# Patient Record
Sex: Male | Born: 1960
Health system: Southern US, Community
[De-identification: ages and names within clinical notes are randomized; demographics above are authoritative.]

## PROBLEM LIST (undated history)

## (undated) DIAGNOSIS — N186 End stage renal disease: Secondary | ICD-10-CM

## (undated) DIAGNOSIS — I219 Acute myocardial infarction, unspecified: Secondary | ICD-10-CM

## (undated) DIAGNOSIS — I5042 Chronic combined systolic (congestive) and diastolic (congestive) heart failure: Secondary | ICD-10-CM

## (undated) DIAGNOSIS — J351 Hypertrophy of tonsils: Secondary | ICD-10-CM

## (undated) DIAGNOSIS — I10 Essential (primary) hypertension: Secondary | ICD-10-CM

## (undated) DIAGNOSIS — E7800 Pure hypercholesterolemia, unspecified: Secondary | ICD-10-CM

## (undated) DIAGNOSIS — Z9981 Dependence on supplemental oxygen: Secondary | ICD-10-CM

## (undated) DIAGNOSIS — G4733 Obstructive sleep apnea (adult) (pediatric): Secondary | ICD-10-CM

## (undated) DIAGNOSIS — N184 Chronic kidney disease, stage 4 (severe): Secondary | ICD-10-CM

## (undated) DIAGNOSIS — H409 Unspecified glaucoma: Secondary | ICD-10-CM

## (undated) DIAGNOSIS — I509 Heart failure, unspecified: Secondary | ICD-10-CM

## (undated) DIAGNOSIS — J449 Chronic obstructive pulmonary disease, unspecified: Secondary | ICD-10-CM

## (undated) DIAGNOSIS — M1990 Unspecified osteoarthritis, unspecified site: Secondary | ICD-10-CM

## (undated) DIAGNOSIS — Z9989 Dependence on other enabling machines and devices: Secondary | ICD-10-CM

## (undated) DIAGNOSIS — E119 Type 2 diabetes mellitus without complications: Secondary | ICD-10-CM

## (undated) DIAGNOSIS — E669 Obesity, unspecified: Secondary | ICD-10-CM

## (undated) HISTORY — DX: Essential (primary) hypertension: I10

## (undated) HISTORY — PX: HERNIA REPAIR: SHX51

## (undated) HISTORY — DX: Obesity, unspecified: E66.9

## (undated) HISTORY — DX: Hypertrophy of tonsils: J35.1

## (undated) HISTORY — PX: UMBILICAL HERNIA REPAIR: SHX196

---

## 2006-09-05 ENCOUNTER — Emergency Department (HOSPITAL_COMMUNITY): Admission: EM | Admit: 2006-09-05 | Discharge: 2006-09-05 | Payer: Self-pay | Admitting: Emergency Medicine

## 2006-09-12 ENCOUNTER — Inpatient Hospital Stay (HOSPITAL_COMMUNITY): Admission: EM | Admit: 2006-09-12 | Discharge: 2006-09-17 | Payer: Self-pay | Admitting: Emergency Medicine

## 2006-09-15 ENCOUNTER — Encounter (INDEPENDENT_AMBULATORY_CARE_PROVIDER_SITE_OTHER): Payer: Self-pay | Admitting: Cardiology

## 2006-09-22 ENCOUNTER — Ambulatory Visit: Payer: Self-pay | Admitting: Family Medicine

## 2006-09-26 ENCOUNTER — Ambulatory Visit: Payer: Self-pay | Admitting: *Deleted

## 2006-10-14 ENCOUNTER — Encounter: Payer: Self-pay | Admitting: Internal Medicine

## 2006-10-14 ENCOUNTER — Ambulatory Visit (HOSPITAL_BASED_OUTPATIENT_CLINIC_OR_DEPARTMENT_OTHER): Admission: RE | Admit: 2006-10-14 | Discharge: 2006-10-14 | Payer: Self-pay | Admitting: *Deleted

## 2006-10-20 ENCOUNTER — Ambulatory Visit: Payer: Self-pay | Admitting: Internal Medicine

## 2006-11-03 ENCOUNTER — Ambulatory Visit: Payer: Self-pay | Admitting: Family Medicine

## 2006-12-25 ENCOUNTER — Ambulatory Visit: Payer: Self-pay | Admitting: Internal Medicine

## 2007-01-22 ENCOUNTER — Ambulatory Visit: Payer: Self-pay | Admitting: Internal Medicine

## 2007-02-11 ENCOUNTER — Ambulatory Visit: Payer: Self-pay | Admitting: Internal Medicine

## 2007-03-04 ENCOUNTER — Ambulatory Visit: Payer: Self-pay | Admitting: Internal Medicine

## 2007-03-04 LAB — CONVERTED CEMR LAB
ALT: 16 U/L
AST: 18 U/L
Albumin: 3.7 g/dL
Alkaline Phosphatase: 43 U/L
BUN: 16 mg/dL
Basophils Absolute: 0 K/uL
Basophils Relative: 0 %
CO2: 27 meq/L
Calcium: 9.1 mg/dL
Chloride: 100 meq/L
Cholesterol: 183 mg/dL
Creatinine, Ser: 1.28 mg/dL
Eosinophils Absolute: 0.1 K/uL
Eosinophils Relative: 1 %
Glucose, Bld: 89 mg/dL
HCT: 43.7 %
HDL: 42 mg/dL
Hemoglobin: 13.9 g/dL
LDL Cholesterol: 124 mg/dL — ABNORMAL HIGH
Lymphocytes Relative: 31 %
Lymphs Abs: 2.3 K/uL
MCHC: 31.8 g/dL
MCV: 89.4 fL
Monocytes Absolute: 0.7 K/uL
Monocytes Relative: 10 %
Neutro Abs: 4.3 K/uL
Neutrophils Relative %: 59 %
Platelets: 307 K/uL
Potassium: 4.2 meq/L
RBC: 4.89 M/uL
RDW: 16 % — ABNORMAL HIGH
Sodium: 139 meq/L
Total Bilirubin: 0.7 mg/dL
Total CHOL/HDL Ratio: 4.4
Total Protein: 8.3 g/dL
Triglycerides: 85 mg/dL
VLDL: 17 mg/dL
WBC: 7.3 10*3/microliter

## 2007-03-10 DIAGNOSIS — I5032 Chronic diastolic (congestive) heart failure: Secondary | ICD-10-CM

## 2007-03-10 DIAGNOSIS — G4733 Obstructive sleep apnea (adult) (pediatric): Secondary | ICD-10-CM

## 2007-03-10 DIAGNOSIS — J351 Hypertrophy of tonsils: Secondary | ICD-10-CM

## 2007-05-25 ENCOUNTER — Ambulatory Visit: Payer: Self-pay | Admitting: Internal Medicine

## 2007-06-01 ENCOUNTER — Ambulatory Visit: Payer: Self-pay | Admitting: Internal Medicine

## 2007-06-17 ENCOUNTER — Ambulatory Visit: Payer: Self-pay | Admitting: Internal Medicine

## 2007-06-17 LAB — CONVERTED CEMR LAB
BUN: 16 mg/dL (ref 6–23)
CO2: 28 meq/L (ref 19–32)
Calcium: 8.8 mg/dL (ref 8.4–10.5)
Creatinine, Ser: 1.18 mg/dL (ref 0.40–1.50)
Glucose, Bld: 101 mg/dL — ABNORMAL HIGH (ref 70–99)

## 2007-06-22 ENCOUNTER — Encounter (INDEPENDENT_AMBULATORY_CARE_PROVIDER_SITE_OTHER): Payer: Self-pay | Admitting: Family Medicine

## 2007-06-22 ENCOUNTER — Ambulatory Visit: Payer: Self-pay | Admitting: Internal Medicine

## 2007-06-22 LAB — CONVERTED CEMR LAB
AST: 17 units/L (ref 0–37)
Alkaline Phosphatase: 46 units/L (ref 39–117)
BUN: 18 mg/dL (ref 6–23)
Creatinine, Ser: 1.39 mg/dL (ref 0.40–1.50)
HDL: 40 mg/dL (ref >39)
LDL Cholesterol: 57 mg/dL (ref 0–99)
Total CHOL/HDL Ratio: 2.8
Triglycerides: 66 mg/dL (ref <150)

## 2007-07-13 ENCOUNTER — Ambulatory Visit: Payer: Self-pay | Admitting: Internal Medicine

## 2007-08-10 ENCOUNTER — Ambulatory Visit: Payer: Self-pay | Admitting: Internal Medicine

## 2007-11-19 ENCOUNTER — Ambulatory Visit: Payer: Self-pay | Admitting: Internal Medicine

## 2008-04-06 ENCOUNTER — Ambulatory Visit: Payer: Self-pay | Admitting: Internal Medicine

## 2008-05-18 ENCOUNTER — Encounter (INDEPENDENT_AMBULATORY_CARE_PROVIDER_SITE_OTHER): Payer: Self-pay | Admitting: Adult Health

## 2008-05-18 ENCOUNTER — Ambulatory Visit: Payer: Self-pay | Admitting: Internal Medicine

## 2008-05-18 LAB — CONVERTED CEMR LAB
ALT: 19 units/L (ref 0–53)
Albumin: 3.5 g/dL (ref 3.5–5.2)
Alkaline Phosphatase: 38 units/L — ABNORMAL LOW (ref 39–117)
Basophils Absolute: 0 10*3/uL (ref 0.0–0.1)
Eosinophils Absolute: 0.1 10*3/uL (ref 0.0–0.7)
Glucose, Bld: 87 mg/dL (ref 70–99)
LDL Cholesterol: 56 mg/dL (ref 0–99)
Lymphocytes Relative: 48 % — ABNORMAL HIGH (ref 12–46)
Lymphs Abs: 2.3 10*3/uL (ref 0.7–4.0)
MCV: 89.2 fL (ref 78.0–100.0)
Neutrophils Relative %: 40 % — ABNORMAL LOW (ref 43–77)
Platelets: 255 10*3/uL (ref 150–400)
Potassium: 4.8 meq/L (ref 3.5–5.3)
RDW: 16 % — ABNORMAL HIGH (ref 11.5–15.5)
Sodium: 141 meq/L (ref 135–145)
Total Bilirubin: 0.5 mg/dL (ref 0.3–1.2)
Total Protein: 7.6 g/dL (ref 6.0–8.3)
Triglycerides: 33 mg/dL (ref <150)
VLDL: 7 mg/dL (ref 0–40)
WBC: 4.7 10*3/uL (ref 4.0–10.5)

## 2008-06-21 ENCOUNTER — Emergency Department (HOSPITAL_COMMUNITY): Admission: EM | Admit: 2008-06-21 | Discharge: 2008-06-21 | Payer: Self-pay | Admitting: Emergency Medicine

## 2008-06-22 ENCOUNTER — Ambulatory Visit: Payer: Self-pay | Admitting: Internal Medicine

## 2008-09-12 ENCOUNTER — Ambulatory Visit: Payer: Self-pay | Admitting: Internal Medicine

## 2008-11-14 ENCOUNTER — Ambulatory Visit: Payer: Self-pay | Admitting: Internal Medicine

## 2008-11-14 DIAGNOSIS — E785 Hyperlipidemia, unspecified: Secondary | ICD-10-CM | POA: Insufficient documentation

## 2009-01-11 ENCOUNTER — Ambulatory Visit: Payer: Self-pay | Admitting: Family Medicine

## 2009-01-11 ENCOUNTER — Encounter (INDEPENDENT_AMBULATORY_CARE_PROVIDER_SITE_OTHER): Payer: Self-pay | Admitting: Adult Health

## 2009-01-11 LAB — CONVERTED CEMR LAB
ALT: 15 units/L (ref 0–53)
AST: 20 units/L (ref 0–37)
Albumin: 3.7 g/dL (ref 3.5–5.2)
Alkaline Phosphatase: 35 units/L — ABNORMAL LOW (ref 39–117)
LDL Cholesterol: 54 mg/dL (ref 0–99)
PSA: 1.39 ng/mL (ref 0.10–4.00)
Potassium: 4.3 meq/L (ref 3.5–5.3)
Pro B Natriuretic peptide (BNP): 106 pg/mL — ABNORMAL HIGH (ref 0.0–100.0)
Sodium: 142 meq/L (ref 135–145)
Total Bilirubin: 0.6 mg/dL (ref 0.3–1.2)
Total Protein: 7.9 g/dL (ref 6.0–8.3)
VLDL: 8 mg/dL (ref 0–40)

## 2009-02-20 ENCOUNTER — Encounter (INDEPENDENT_AMBULATORY_CARE_PROVIDER_SITE_OTHER): Payer: Self-pay | Admitting: Adult Health

## 2009-02-20 ENCOUNTER — Ambulatory Visit: Payer: Self-pay | Admitting: Internal Medicine

## 2009-02-20 LAB — CONVERTED CEMR LAB
CO2: 22 meq/L (ref 19–32)
Calcium: 8.6 mg/dL (ref 8.4–10.5)
Creatinine, Ser: 1.11 mg/dL (ref 0.40–1.50)
Sodium: 139 meq/L (ref 135–145)

## 2009-02-22 ENCOUNTER — Encounter: Payer: Self-pay | Admitting: Internal Medicine

## 2009-02-22 ENCOUNTER — Ambulatory Visit (HOSPITAL_COMMUNITY): Admission: RE | Admit: 2009-02-22 | Discharge: 2009-02-22 | Payer: Self-pay | Admitting: Internal Medicine

## 2009-02-22 ENCOUNTER — Ambulatory Visit: Payer: Self-pay | Admitting: Cardiology

## 2009-03-23 ENCOUNTER — Ambulatory Visit: Payer: Self-pay | Admitting: Internal Medicine

## 2009-04-17 ENCOUNTER — Ambulatory Visit: Payer: Self-pay | Admitting: Internal Medicine

## 2009-07-03 ENCOUNTER — Ambulatory Visit: Payer: Self-pay | Admitting: Internal Medicine

## 2009-07-03 ENCOUNTER — Encounter (INDEPENDENT_AMBULATORY_CARE_PROVIDER_SITE_OTHER): Payer: Self-pay | Admitting: Adult Health

## 2009-07-03 LAB — CONVERTED CEMR LAB
ALT: 17 units/L (ref 0–53)
AST: 24 units/L (ref 0–37)
CO2: 27 meq/L (ref 19–32)
Calcium: 9.1 mg/dL (ref 8.4–10.5)
Chloride: 102 meq/L (ref 96–112)
Cholesterol: 109 mg/dL (ref 0–200)
Creatinine, Ser: 1.15 mg/dL (ref 0.40–1.50)
Hgb A1c MFr Bld: 5.9 % (ref 4.6–6.1)
Pro B Natriuretic peptide (BNP): 26.6 pg/mL (ref 0.0–100.0)
Sodium: 138 meq/L (ref 135–145)
Total Bilirubin: 0.5 mg/dL (ref 0.3–1.2)
Total CHOL/HDL Ratio: 2.3
Total Protein: 7.6 g/dL (ref 6.0–8.3)
VLDL: 7 mg/dL (ref 0–40)

## 2009-09-20 ENCOUNTER — Emergency Department (HOSPITAL_COMMUNITY): Admission: EM | Admit: 2009-09-20 | Discharge: 2009-09-20 | Payer: Self-pay | Admitting: Emergency Medicine

## 2009-10-02 ENCOUNTER — Ambulatory Visit: Payer: Self-pay | Admitting: Internal Medicine

## 2009-11-14 ENCOUNTER — Ambulatory Visit: Payer: Self-pay | Admitting: Internal Medicine

## 2009-12-18 ENCOUNTER — Ambulatory Visit: Payer: Self-pay | Admitting: Internal Medicine

## 2010-04-30 ENCOUNTER — Encounter (INDEPENDENT_AMBULATORY_CARE_PROVIDER_SITE_OTHER): Payer: Self-pay | Admitting: *Deleted

## 2010-04-30 LAB — CONVERTED CEMR LAB
ALT: 25 units/L (ref 0–53)
AST: 36 units/L (ref 0–37)
Creatinine, Ser: 1.05 mg/dL (ref 0.40–1.50)
HDL: 52 mg/dL (ref >39)
Sodium: 140 meq/L (ref 135–145)
Total Bilirubin: 0.9 mg/dL (ref 0.3–1.2)
Total Protein: 7.4 g/dL (ref 6.0–8.3)

## 2010-06-28 NOTE — Assessment & Plan Note (Signed)
Summary: 12 months/apc   Primary Provider/Referring Provider:  Shanon Brow Talbot/ [facility]  CC:  12 month, pt states he is feeling, and no new complaints; not using CPAP per pt..  History of Present Illness: 11-28-2007- OSA 50 year old man returning for follow-up of sleep apnea.  He says he is doing great and sleeping very well.  CPAP is at 13 cwp and is worn every night.  He never did have surgery on his tonsils.he denies headache, nasal congestion, daytime sleepiness.  11/14/08- OSA One year f/u. He remains fully compliant with cpap at 13 and says "it is great, man". Nasal pillows mask- [facility]. He never did get tonsilectomy. Denies headache, nasal congestion, sinus pressure pain, sore throats, cough or wheeze.    November 14, 2009- OSA A couple months ago he just stopped bothering to use CPAP " no reason". Sleeps alone with no reporter to c/o his snoring. he says no longer falling asleep in daytime. Over past 3 years he has lost weight from 330 in 12/08 to 307 today. Says he is watching diet, joined gym recently.   Preventive Screening-Counseling & Management  Alcohol-Tobacco     Smoking Status: never  Current Medications (verified): 1)  Norvasc 10 Mg Tabs (Amlodipine Besylate) .... Take 1 Tablet By Mouth Once A Day 2)  Lisinopril 40 Mg  Tabs (Lisinopril) .... One By Mouth Once Daily 3)  Bl Aspirin 325 Mg  Tabs (Aspirin) .... Use One Daily 4)  Cpap - 13 Cwp  [facility] 5)  Lipitor 10 Mg  Tabs (Atorvastatin Calcium) .... Take 1 Tablet By Mouth Once A Day 6)  Atenolol 50 Mg Tabs (Atenolol) .... 1/2 Tab Every Day 7)  Furosemide 20 Mg Tabs (Furosemide) .Marland Kitchen.. 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   Risk Factors: Smoking Status: never (11/14/2009)  Social History: works as a Training and development officer single  no children Patient never smoked.   Review of Systems      See HPI       The patient complains of weight loss.  The patient denies anorexia, fever, weight gain, vision loss, decreased hearing, hoarseness, chest pain, syncope, dyspnea on exertion, peripheral edema, prolonged cough, headaches, hemoptysis, abdominal pain, melena, hematochezia, and severe indigestion/heartburn.    Vital Signs:  Patient profile:   50 year old male Height:      72 inches Weight:      307 pounds BMI:     41.79 O2 Sat:      96 % on Room air Pulse rate:   50 / minute BP sitting:   120 / 80  (left arm) Cuff size:   large  Vitals Entered By: Lyndee Leo, CMA (November 14, 2009 9:02 AM)  O2 Sat at Rest %:  96 O2 Flow:  Room air  Reason for Visit 12 monthg f/u no new complaints CC: 12 month, pt states he is feeling, no new complaints; not using CPAP per pt.   Physical Exam  Additional Exam:  General: A/Ox3; pleasant and cooperative, NAD, overweight SKIN: no  rash, lesions NODES: no lymphadenopathy HEENT: [place]/AT, EOM- WNL, Conjuctivae- clear, PERRLA, TM-WNL, Nose- clear, Throat- tonsils large, nonocclusive, Mallampati III, no stridor or hoarseness. NECK: Supple w/ fair ROM, JVD- none, normal carotid impulses w/o bruits Thyroid- normal to palpation CHEST: Clear to P&A HEART: RRR, no m/g/r heard ABDOMEN: Soft and nl; [id number], nl pulses, no edema  NEURO: Grossly intact to observation      Impression & Recommendations:  Problem # 1:  OBSTRUCTIVE SLEEP APNEA (ICD-327.23)  Noncompliant after good initial experience. He might have  lost enough weight to matter.We discussed medical problems, symptoms and conservative treatments for OSA.  We will have his DME work with him on comfort issues. Consider cpap desensitization with sleep techs if needed.  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 No need to consider surgery referral at this time.  Problem # 3:  EXOGENOUS OBESITY (ICD-278.00)  Sustained progress at weight loss would change his whole health paradigm so i encouraged him to continue. He agrees to an Copy for cpap/ pressure need.  Medications Added to Medication List This Visit: 1)  Furosemide 20 Mg Tabs (Furosemide) .Marland Kitchen.. 1 tablet daily  Other Orders: Est. Patient Level III [id number]) DME Referral (DME)   Patient Instructions: 1)  Please schedule a follow-up appointment in 1 month. 2)  We will contact [facility] to arrange a pressure check on your CPAP to see where you need to be now. Tell them if you have other problems, like uncomfortable cpap mask.   Immunization History:  Influenza Immunization History:    Influenza:  fluvax 3+ (06/02/2009)

## 2010-06-28 NOTE — Assessment & Plan Note (Signed)
Summary: rov 1 month ///kp   Primary Provider/Referring Provider:  Shanon Brow Talbot/ [facility]  CC:  1 month follow up.  States he is wearing cpap everynight since last OV for approx 6-7 hours each night.  Denies problems with pressure and mask.  Marland Kitchen  History of Present Illness: History of Present Illness: 12/15/07- OSA 50 year old man returning for follow-up of sleep apnea.  He says he is doing great and sleeping very well.  CPAP is at 13 cwp and is worn every night.  He never did have surgery on his tonsils.he denies headache, nasal congestion, daytime sleepiness.  11/14/08- OSA One year f/u. He remains fully compliant with cpap at 13 and says "it is great, man". Nasal pillows mask- [facility]. He never did get tonsilectomy. Denies headache, nasal congestion, sinus pressure pain, sore throats, cough or wheeze.    November 14, 2009- OSA A couple months ago he just stopped bothering to use CPAP " no reason". Sleeps alone with no reporter to c/o his snoring. he says no longer falling asleep in daytime. Over past 3 years he has lost weight from 330 in 12/08 to 307 today. Says he is watching diet, joined gym recently.  December 18, 2009 - OSA He is back to using CPAP at 13 cwp for at least 6-7 hours everynight and doing better. He feels more energy- better rested. He would prefer not to need it, but it doesn't hurt and there are no specific problems.  Denies routine cough.    Preventive Screening-Counseling & Management  Alcohol-Tobacco     Smoking Status: never  Current Medications (verified): 1)  Norvasc 10 Mg Tabs (Amlodipine Besylate) .... Take 1 Tablet By Mouth Once A Day 2)  Lisinopril 40 Mg  Tabs (Lisinopril) .... One By Mouth Once Daily 3)  Bl Aspirin 325 Mg  Tabs (Aspirin) .... Use One Daily 4)  Cpap - 13 Cwp  [facility] 5)  Lipitor 10 Mg  Tabs (Atorvastatin Calcium) .... Take 1 Tablet By Mouth Once A Day 6)  Atenolol 50 Mg Tabs (Atenolol) .... 1/2 Tab Every Day 7)  Furosemide 20 Mg  Tabs (Furosemide) .Marland Kitchen.. 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   Risk Factors: Smoking Status: never (12/18/2009)  Review of Systems      See HPI  The patient denies anorexia, fever, weight loss, weight gain, vision loss, decreased hearing, hoarseness, chest pain, syncope, dyspnea on exertion, peripheral edema, prolonged cough, headaches, hemoptysis, abdominal pain, and severe indigestion/heartburn.    Vital Signs:  Patient profile:   50 year old male Height:      72 inches Weight:      310.50 pounds BMI:     42.26 O2 Sat:      100 % on Room air Pulse rate:   56 / minute BP sitting:   102 / 68  (right arm) Cuff size:   large  Vitals Entered By: Raymondo Band RN (December 18, 2009 9:00 AM)  O2 Flow:  Room air CC: 1 month follow up.  States  he is wearing cpap everynight since last OV for approx 6-7 hours each night.  Denies problems with pressure and mask.   Comments Medications reviewed with patient Daytime contact number verified with patient. Raymondo Band RN  December 18, 2009 9:01 AM    Physical Exam  Additional Exam:  General: A/Ox3; pleasant and cooperative, NAD, overweight. Exam is little changed. SKIN: no rash, lesions NODES: no lymphadenopathy HEENT: [place]/AT, EOM- WNL, Conjuctivae- clear, PERRLA, TM-WNL, Nose- clear, Throat- tonsils large, nonocclusive, Mallampati III, no stridor or hoarseness. NECK: Supple w/ fair ROM,  JVD- none, normal carotid impulses w/o bruits Thyroid- normal to palpation CHEST: Clear to P&A HEART: RRR, no m/g/r heard ABDOMEN: Soft and nl; [id number], nl pulses, no edema  NEURO: Grossly intact to observation      Impression & Recommendations:  Problem # 1:  OBSTRUCTIVE SLEEP APNEA (ICD-327.23)  Good compliance and control now. He is motivated and i think he can continue long term. We discussed alternatives and medical reasons to use CPAP.  Problem # 2:  EXOGENOUS OBESITY (ICD-278.00)  Weight loss would help his sleep apnea and is encouraged.  Orders: Est. Patient Level III [id number])  Patient Instructions: 1)  Please schedule a follow-up appointment in 1 year. 2)  Continue CPAP all night, every night at 13 cwp.  Call us or the home care company if it gets uncomfortable or you have problems.

## 2010-09-10 LAB — DIFFERENTIAL
Basophils Absolute: 0 10*3/uL (ref 0.0–0.1)
Lymphocytes Relative: 34 % (ref 12–46)
Monocytes Absolute: 0.9 10*3/uL (ref 0.1–1.0)
Monocytes Relative: 12 % (ref 3–12)
Neutro Abs: 3.9 10*3/uL (ref 1.7–7.7)
Neutrophils Relative %: 53 % (ref 43–77)

## 2010-09-10 LAB — COMPREHENSIVE METABOLIC PANEL
Albumin: 3.1 g/dL — ABNORMAL LOW (ref 3.5–5.2)
BUN: 18 mg/dL (ref 6–23)
Creatinine, Ser: 1.32 mg/dL (ref 0.4–1.5)
Glucose, Bld: 84 mg/dL (ref 70–99)
Total Bilirubin: 0.8 mg/dL (ref 0.3–1.2)
Total Protein: 7.5 g/dL (ref 6.0–8.3)

## 2010-09-10 LAB — CBC
HCT: 40.8 % (ref 39.0–52.0)
MCV: 86.9 fL (ref 78.0–100.0)
Platelets: 218 10*3/uL (ref 150–400)
RDW: 15.9 % — ABNORMAL HIGH (ref 11.5–15.5)

## 2010-09-10 LAB — POCT CARDIAC MARKERS
CKMB, poc: 7.6 ng/mL (ref 1.0–8.0)
Myoglobin, poc: 246 ng/mL (ref 12–200)

## 2010-09-13 ENCOUNTER — Emergency Department (HOSPITAL_COMMUNITY): Payer: Self-pay

## 2010-09-13 ENCOUNTER — Emergency Department (HOSPITAL_COMMUNITY)
Admission: EM | Admit: 2010-09-13 | Discharge: 2010-09-13 | Disposition: A | Discharge status: Home / Self Care | Payer: Self-pay | Attending: Emergency Medicine | Admitting: Emergency Medicine

## 2010-09-13 DIAGNOSIS — I10 Essential (primary) hypertension: Secondary | ICD-10-CM | POA: Insufficient documentation

## 2010-09-13 DIAGNOSIS — I509 Heart failure, unspecified: Secondary | ICD-10-CM | POA: Insufficient documentation

## 2010-09-13 DIAGNOSIS — R609 Edema, unspecified: Secondary | ICD-10-CM | POA: Insufficient documentation

## 2010-09-13 DIAGNOSIS — M25539 Pain in unspecified wrist: Secondary | ICD-10-CM | POA: Insufficient documentation

## 2010-09-13 DIAGNOSIS — M25579 Pain in unspecified ankle and joints of unspecified foot: Secondary | ICD-10-CM | POA: Insufficient documentation

## 2010-09-13 DIAGNOSIS — S93409A Sprain of unspecified ligament of unspecified ankle, initial encounter: Secondary | ICD-10-CM | POA: Insufficient documentation

## 2010-09-13 DIAGNOSIS — W108XXA Fall (on) (from) other stairs and steps, initial encounter: Secondary | ICD-10-CM | POA: Insufficient documentation

## 2010-09-13 DIAGNOSIS — M25569 Pain in unspecified knee: Secondary | ICD-10-CM | POA: Insufficient documentation

## 2010-09-13 DIAGNOSIS — S63509A Unspecified sprain of unspecified wrist, initial encounter: Secondary | ICD-10-CM | POA: Insufficient documentation

## 2010-09-13 DIAGNOSIS — E785 Hyperlipidemia, unspecified: Secondary | ICD-10-CM | POA: Insufficient documentation

## 2010-09-13 DIAGNOSIS — IMO0002 Reserved for concepts with insufficient information to code with codable children: Secondary | ICD-10-CM | POA: Insufficient documentation

## 2010-09-18 ENCOUNTER — Emergency Department (HOSPITAL_COMMUNITY)
Admission: EM | Admit: 2010-09-18 | Discharge: 2010-09-18 | Disposition: A | Discharge status: Home / Self Care | Payer: Self-pay | Attending: Emergency Medicine | Admitting: Emergency Medicine

## 2010-09-18 DIAGNOSIS — S8990XA Unspecified injury of unspecified lower leg, initial encounter: Secondary | ICD-10-CM | POA: Insufficient documentation

## 2010-09-18 DIAGNOSIS — M25469 Effusion, unspecified knee: Secondary | ICD-10-CM | POA: Insufficient documentation

## 2010-09-18 DIAGNOSIS — M25569 Pain in unspecified knee: Secondary | ICD-10-CM | POA: Insufficient documentation

## 2010-09-18 DIAGNOSIS — Y92009 Unspecified place in unspecified non-institutional (private) residence as the place of occurrence of the external cause: Secondary | ICD-10-CM | POA: Insufficient documentation

## 2010-09-18 DIAGNOSIS — W19XXXA Unspecified fall, initial encounter: Secondary | ICD-10-CM | POA: Insufficient documentation

## 2010-09-18 DIAGNOSIS — E785 Hyperlipidemia, unspecified: Secondary | ICD-10-CM | POA: Insufficient documentation

## 2010-09-18 DIAGNOSIS — I10 Essential (primary) hypertension: Secondary | ICD-10-CM | POA: Insufficient documentation

## 2010-09-18 DIAGNOSIS — IMO0002 Reserved for concepts with insufficient information to code with codable children: Secondary | ICD-10-CM | POA: Insufficient documentation

## 2010-09-18 DIAGNOSIS — Z79899 Other long term (current) drug therapy: Secondary | ICD-10-CM | POA: Insufficient documentation

## 2010-09-18 DIAGNOSIS — I509 Heart failure, unspecified: Secondary | ICD-10-CM | POA: Insufficient documentation

## 2010-09-18 DIAGNOSIS — S99919A Unspecified injury of unspecified ankle, initial encounter: Secondary | ICD-10-CM | POA: Insufficient documentation

## 2010-10-09 NOTE — Assessment & Plan Note (Signed)
[place]                             PULMONARY OFFICE NOTE   NAME:Adam Dennis, Adam Dennis                      MRN:          [id number]  DATE:12/25/2006                            DOB:          1960-06-22    PROBLEM:  This is a 50 year old man referred through the courtesy of Dr.  Jobe Igo in sleep medicine consultation.   HISTORY:  The patient gives a history of loud snoring. He is not aware  if he stops breathing. He does notice daytime sleepiness, but implies  that it is not in the way of his lifestyle. He reports a very irregular  sleep scheduling, averaging 5 to 6 hours of sleep at night, but says  that he has been doing much better since he was hospitalized for  congestive heart failure in May. The discharge summary from that visit  indicates diastolic heart failure and apparently he lost quite a bit of  weight with diuresis. Bedtime is anywhere from 10pm to 1am with short  sleep latency estimating three awakenings per night, mostly for the  bathroom. He is up in the morning between 6 and 6:15am. A nocturnal  polysomnogram was done 10/14/2006 demonstrating very severe obstructive  apnea with an index of 161 per hour, very loud snoring, and oxygen  desaturation nadir of 58%. CPAP titration was not done.   MEDICATIONS:  1. Norvasc 5 mg.  2. Lisinopril 40 mg.  3. Hydrochlorothiazide 25 mg.  4. Aspirin 325 mg.   ALLERGIES:  No medication allergy.   REVIEW OF SYSTEMS:  He denies nasal congestion and says that his weight  has been stable for a long time, except for the weight loss associated  with diuresis after his hospital visit. He sleeps on one pillow with  nocturia once or twice.   PAST HISTORY:  Hypertension, diastolic heart failure, sleep apnea. He  denies any history of lung disease. He denies any surgery on his nose or  throat.   SOCIAL HISTORY:  Never smoked. He stopped drinking in May 2008. He is  single with no children.   FAMILY  HISTORY:  Nobody with sleep apnea or heart disease.   OBJECTIVE:  VITAL SIGNS:  Weight 335 pounds, blood pressure 110/78,  pulse 84, room air saturation 96%.  GENERAL:  He is quite obese and alert.  HEENT:  Palette spacing 3-4/4. Tonsils are prominent. Voice quality is  normal without strider. There is no palpable thyroid.  NECK:  Veins are not distended.  HEART:  Sounds are regular without murmur or gallop.  CHEST:  Clear.  EXTREMITIES:  There is no peripheral edema now.   IMPRESSION:  1. Very severe obstructive sleep apnea/hypopnea syndrome.  2. Exogenous obesity.  3. Diastolic heart failure with weight loss attributed to diuresis.  4. Tonsil hypertrophy.   PLAN:  1. We are going to titrate him for CPAP.  2. I have discussed sleep apnea physiology and available treatments.  3. He is encouraged to return in one month for follow up, or earlier      p.r.n.     Clinton D. Young,  MD, [place], FACP  Electronically Signed    CDY/MedQ  DD: 12/27/2006  DT: 12/28/2006  Job #: [id number]   cc:   Monia Sabal. Jobe Igo, M.D.

## 2010-10-09 NOTE — Procedures (Signed)
NAME:  Adam Dennis, Adam Dennis               ACCOUNT NO.:  [id number]   MEDICAL RECORD NO.:  [id number]          PATIENT TYPE:  OUT   LOCATION:  SLEEP CENTER                 FACILITY:  [facility]   PHYSICIAN:  Clinton D. Annamaria Boots, MD, FCCP, FACPDATE OF BIRTH:  1961-04-[birth year]   DATE OF STUDY:                            NOCTURNAL POLYSOMNOGRAM   REFERRING PHYSICIAN:  Vladimir Faster, MD   INDICATION FOR STUDY:  Hypersomnia with sleep apnea.   EPWORTH SLEEPINESS SCORE:  10/24.   BODY MASS INDEX:  45.4.   WEIGHT:  355 pounds.   MEDICATIONS:  Home medications are listed and reviewed.  A diagnostic  MPSG protocol was requested.   SLEEP ARCHITECTURE:  Total sleep time 334 minutes with sleep efficiency  82%.  Stage 1 was 15%, stage 2 71%, and stages 3 and 4 absent.  REM 14%  of total sleep time.  Sleep latency 12 minutes.  REM latency 134  minutes.  Awake after sleep onset 61 minutes.  Arousal index 47.8.  No  bedtime medication was taken.   RESPIRATORY DATA:  Diagnostic MPSG protocol as ordered.  Very severe  obstructive sleep apnea/hypopnea syndrome.  Apnea/hypopnea index  (AHI,RDI) 161.5 obstructive events per hour.  Events were not  positional.  REM AHI 147.   OXYGEN DATA:  Very loud snoring with oxygen desaturation to a nadir of  58%.  Mean oxygen saturation through the study was 85% on room air.   CARDIAC DATA:  Normal sinus rhythm with sinus arrhythmia.   MOVEMENT-PARASOMNIA:  Rare limb jerk, insignificant.   IMPRESSIONS-RECOMMENDATIONS:  1. Very severe obstructive sleep apnea/hypopnea syndrome, AHI 161.5      per hour non- positional events.  Very loud snoring with oxygen      desaturation.  2. Oxygen desaturation was to a nadir of 58%.  A mean oxygen      saturation through the study was 85% on room air.  Ordinarily, this      would be an indication of underlying cardiopulmonary disease, but      the very frequent apneic events suggest that the apneas may      normalize oxygen saturation for  this patient.  3. A diagnostic MPSG protocol was ordered and followed.  A split study      protocol would have authorized the      technician to proceed directly to CPAP.  Consider early return for      CPAP titration or evaluate for alternative therapies is      appropriate.      Clinton D. Annamaria Boots, MD, [place], FACP  Diplomate, Tax adviser of Sleep Medicine  Electronically Signed     CDY/MEDQ  D:  10/20/2006 09:11:22  T:  10/20/2006 09:45:53  Job:  [id number]

## 2010-10-09 NOTE — Assessment & Plan Note (Signed)
[place]                             PULMONARY OFFICE NOTE   NAME:Zettlemoyer, KALEAL BAMBA                      MRN:          [id number]  DATE:01/22/2007                            DOB:          [birth year]    PROBLEM:  1. Obstructive sleep apnea.  2. Exogenous obesity.  3. Diastolic heart failure.  4. Tonsil hypertrophy.   HISTORY:  He feels comfortable with CPAP now at 13 CWP using a Nasal  Pillows mask and is able to use it all night every night with less  daytime sleepiness.   MEDICATIONS:  1. Norvasc 5 mg.  2. Lisinopril 40 mg.  3. HCTZ 25 mg.  4. Aspirin 325 mg.  5. CPAP at 13 CWP.   ALLERGIES:  NONE.   OBJECTIVE:  VITAL SIGNS:  Weight 339 pounds, still massively overweight;  blood pressure 130/80, pulse 82, room air saturation 97%.  GENERAL:  He seems alert.  There are no pressure marks on his face.  HEENT:  Nasal airway is clear.   IMPRESSION:  1. Obstructive sleep apnea under better control now with continuous      positive airway pressure.  2. Obesity.   PLAN:  I reemphasized weight loss, and we again discussed sleep hygiene  issues and use of CPAP.  Schedule return in 4 months or earlier p.r.n.     Clinton D. Annamaria Boots, MD, [place] Flood, [place]  Electronically Signed    CDY/MedQ  DD: 01/26/2007  DT: 01/26/2007  Job #: [id number]   cc:   Monia Sabal. Jobe Igo, M.D.

## 2010-10-12 NOTE — Discharge Summary (Signed)
NAME:  Adam Dennis, Adam Dennis               ACCOUNT NO.:  [id number]   MEDICAL RECORD NO.:  [id number]          PATIENT TYPE:  INP   LOCATION:  [id number]                         FACILITY:  [place]   PHYSICIAN:  Vladimir Faster, MD        DATE OF BIRTH:  08/03/1960   DATE OF ADMISSION:  09/12/2006  DATE OF DISCHARGE:  09/17/2006                               DISCHARGE SUMMARY   PRIMARY CARE PHYSICIAN:  [facility]; this patient is unassigned to [place].   CONSULTATIONS IN THE HOSPITAL:  Cardiology; was seen by Dr. Einar Gip.   DISCHARGE DIAGNOSES:  1. congestive cardiac failure, likely diastolic heart failure.  2. Anasarca.  3. Questionable sleep apnea.  4. Hypertension.  5. Morbid obesity.   DISCHARGE MEDICATIONS:  1. Aspirin 325 mg daily.  2. Lisinopril 40 mg daily.  3. Hydrochlorothiazide 25 mg daily.  4. Norvasc 10 mg daily.   HISTORY OF PRESENT ILLNESS:  For full history and physical, see the  history and physical dictated by Dr. Jari Favre on September 12, 2006.  In  short, Adam Dennis is a 50 year old morbidly obese gentleman who came to  the ER with complaints of swelling and shortness of breath, which was  progressing over the last few weeks.  He had swelling all over his  extremities and his penis and his scrotum.  He had an elevated BNPT of  441, and he was hypoxic with an oxygen saturation of 87% room air.  He  was admitted for further evaluation of possible congestive cardiac  failure.   PROBLEM LIST:  1. Congestive cardiac failure.  He was admitted, and he was started on      diuretics with good response.  We did a CT angiogram in the ER      which did not show any PE.  He had a 2-D echocardiogram done on      September 15, 2006 which was technically a limited study with poor      acoustic windows.  It was unable to assess the left ventricle      ejection fraction on him.  I discussed this with Dr. Einar Gip, the      cardiologist who was seeing him, who did not recommend any further      studies at this  time.  His breathing is now back to his baseline.      He is off oxygen.  His swelling has decreased to his baseline.  As      per Dr. Einar Gip, he is stable to be discharged home, and he would      continue him on just hydrochlorothiazide at home.  He does not want      him on Lasix at this time.  He is asked to follow-up with his      doctor at [facility], and if he sees still continues to have fluid      overload issues, Dr. Einar Gip will be glad to see him in his office.  2. Hypertension.  His blood pressure has been high since he was      admitted.  At  this time, his blood pressure is running 145/92.  We      will be increasing his lisinopril from 20 to 40 at the time of      discharge.  He will need to be followed up at [facility], and we      would want tighter control of his blood pressure.  I suspect that      his heart failure is secondary to his high blood pressure, too.  3. Morbid obesity.  He would need to lose some weight, and he is going      to increase his activity and exercise.  He also has questionable      sleep apnea, and he has been set up for an outpatient sleep study.   DISPOSITION:  He is now being discharged home in stable condition.  He  will be given prescriptions for all his medications.  We are trying to  set up an outpatient sleep study.  The sleep center should call him to  set up the appointment.   FOLLOW UP:  He is asked to follow-up with [facility].  He is already  established at [facility].  He is asked to follow-up with [facility]  in a week's time.      Vladimir Faster, MD  Electronically Signed     PKN/MEDQ  D:  09/17/2006  T:  09/17/2006  Job:  [id number]

## 2010-10-12 NOTE — H&P (Signed)
NAME:  Adam Dennis, Adam Dennis               ACCOUNT NO.:  [id number]   MEDICAL RECORD NO.:  [id number]          PATIENT TYPE:  INP   LOCATION:  [id number]                         FACILITY:  [facility]   PHYSICIAN:  Cyril Mourning, D.O.    DATE OF BIRTH:  September 03, 1960   DATE OF ADMISSION:  09/12/2006  DATE OF DISCHARGE:                              HISTORY & PHYSICAL   PRIMARY CARE PHYSICIAN:  Unassigned.   CHIEF COMPLAINT:  Swelling and shortness of breath.   HISTORY OF PRESENT ILLNESS:  Adam Dennis is a very pleasant, 50 year old,  morbidly obese male who had been in his usual state of health up until  over a week ago, when he first noticed swelling in his penis about 1  week ago presented to the emergency department at [facility]  where he was provided with Lasix.  He took these medications and stated  that this did not seem to help.  He presents again to the emergency  department with continued swelling of his genitals, abdomen, as well as  swelling of his lower extremities.  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  He works at the __________  [employer], and developed some shortness  of breath.  He presented to the emergency department and he was  discovered to have an elevated BNP of 441, as well as an oxygen  saturation of 87% on room air.  His pO2 was also discovered to be 42.  He underwent evaluation for pulmonary embolus.  Due to his large body  habitus, the study was limited though it ruled out central pulmonary  emboli.  He denies any chest pain.   PAST MEDICAL HISTORY:  He denies.   OTHER SURGICAL HISTORY:  He denies.   ROUTINE MEDICATIONS:  He denies.  A little over a week ago, he stated  that he was given Lasix and describes what sounds like Lisinopril.   ALLERGIES:  NO KNOWN DRUG ALLERGIES.   SOCIAL HISTORY:  He is employed as a Training and development officer, quite sedentary lifestyle.  He denies tobacco.  He quit drinking a couple years ago.  He lives with  his brother, Adam Dennis, phone number [phone].   FAMILY HISTORY:  His mother is deceased at age 90, with diabetes and  heart problems.  He does not know his parents.  He has two brothers and  three sisters, one of his brothers has hypertension and diabetes.   REVIEW OF SYSTEMS:  He denies any nausea, vomiting, diarrhea, chest  pain.  He does report shortness of breath with very minimal exertion.  No hematemesis, hematochezia.  He does have swelling of his lower  extremities.  Under further review this is unremarkable.   PHYSICAL EXAMINATION:  VITAL SIGNS:  In the emergency department, he had  a blood pressure of 156/87, temperature 98, heart rate 102, respirations  24, O2 saturations 87% initially.  HEENT:  Head is normocephalic, atraumatic.  Extraocular muscles are  intact.  He does not appear to be in acute distress, though he is mildly  tachypneic.  He is able to speak in full sentences, however.  NECK:  Supple and nontender.  CARDIOVASCULAR:  Reveals a normal S1-S2.  LUNGS:  He exhibits normal effort.  The breath sounds are clear and  present bilaterally with a normal effort.  ABDOMEN:  Very obese.  He exhibits anasarca.  No tenderness.  GENITOURINARY:  His genitalia do reveal edema about his penis and  scrotum.  He does have a large overhanging pannus.  LOWER EXTREMITIES:  Reveal edema bilaterally.  No calf tenderness.   LABORATORY DATA:  In the emergency department, he underwent CT scanning  as described above that ruled out central pulmonary emboli.  Point-of-  care markers revealed a myoglobin of 352, CK-MB of 7, troponin-I 0.05.  Blood gas revealed a pH of 7.315, and a pCO2 69, pO2 of 42.  Sodium 141,  potassium 4.1, BUN 10, creatinine 1.2, glucose 101.  BNP of 441.  WBC of  6.1, hemoglobin 13.6, platelet count 296, MCV 79.  Chest x-ray as noted  above, revealed cardiomegaly with mild pulmonary vascular congestion.  EKG revealed normal sinus rhythm with nonspecific T-wave abnormalities.   ASSESSMENT:  1. Anasarca  2.  New-onset congestive heart failure.  3. Morbid obesity.  4. Mild elevation of myoglobin.   PLAN:  1. At this time, we are going to admit [name] for evaluation of new      onset congestive heart failure.  2. He will need a 2-D echocardiogram.  3. We will cycle his cardiac markers.  4. We will treat his congestive heart failure.  5. At some point he will need evaluation for coronary disease.  6. We will administer low-dose ACE inhibitor and follow his BUN      closely and administer DVT prophylaxis as indicated.  7. He will need a primary care physician      Cyril Mourning, D.O.  Electronically Signed     ESS/MEDQ  D:  09/12/2006  T:  09/13/2006  Job:  [id number]

## 2010-12-17 ENCOUNTER — Ambulatory Visit (INDEPENDENT_AMBULATORY_CARE_PROVIDER_SITE_OTHER): Payer: Self-pay | Admitting: Internal Medicine

## 2010-12-17 ENCOUNTER — Encounter: Payer: Self-pay | Admitting: Internal Medicine

## 2010-12-17 VITALS — BP 104/66 | HR 62 | Ht 72.0 in | Wt 340.8 lb

## 2010-12-17 DIAGNOSIS — G4733 Obstructive sleep apnea (adult) (pediatric): Secondary | ICD-10-CM

## 2010-12-17 DIAGNOSIS — E669 Obesity, unspecified: Secondary | ICD-10-CM

## 2010-12-17 NOTE — Assessment & Plan Note (Signed)
Good compliance and control. Serious weight loss and tonsillectomy were discussed as changes that might permit him to get off CPAP. Otherwise, with his heart and BP problems, CPAP is clearly protective. No setting or equipment changes seem needed at this time.

## 2010-12-17 NOTE — Progress Notes (Signed)
Subjective:    Patient ID: Adam Dennis, male    DOB: 11-15-1960, 50 y.o.   MRN: [id number]  HPI 12/17/10 - 50 yo never smoker, followed for  OSA complicated by obesity, HBP, chronic diastolic heart failure Last here 12/19/10- Still using CPAP all night every night at 13- [facility]. Denies daytime somnolence or complaints about snoring. Has not been able to lose weight. PCP is [facility]   Review of Systems Constitutional:   No-   weight loss, night sweats, fevers, chills, fatigue, lassitude. HEENT:   No-   headaches, difficulty swallowing, tooth/dental problems, sore throat,                  No-   sneezing, itching, ear ache, nasal congestion, post nasal drip,   CV:  No-   chest pain, orthopnea, PND, swelling in lower extremities, anasarca, dizziness, palpitations  GI:  No-   heartburn, indigestion, abdominal pain, nausea, vomiting, diarrhea,                 change in bowel habits, loss of appetite  Resp: No- Acute  shortness of breath with exertion or at rest.  No-  excess mucus,             No-   productive cough,  No non-productive cough,  No-  coughing up of blood.              No-   change in color of mucus.  No- wheezing.    Skin: No-   rash or lesions.  GU: No-   dysuria, change in color of urine, no urgency or frequency.  No- flank pain.  MS:  No-   joint pain or swelling.  No- decreased range of motion.  No- back pain.  Psych:  No- change in mood or affect. No depression or anxiety.  No memory loss.      Objective:   Physical Exam General- Alert, Oriented, Affect-appropriate, Distress- none acute   obese Skin- rash-none, lesions- none, excoriation- none Lymphadenopathy- none Head- atraumatic            Eyes- Gross vision intact, PERRLA, conjunctivae clear secretions            Ears- Hearing, canals normal            Nose- Clear, No-Septal dev, mucus, polyps, erosion, perforation             Throat- Mallampati IV , mucosa clear , drainage- none,                   tonsils- large/ touching Neck- flexible , trachea midline, no stridor , thyroid nl, carotid no bruit Chest - symmetrical excursion , unlabored           Heart/CV- RRR , no murmur , no gallop  , no rub, nl s1 s2  Faintly heard                           - JVD- none , edema- none, stasis changes- none, varices- none           Lung- clear to P&A, wheeze- none, cough- none , dullness-none, rub- none no rales           Chest wall-  Abd- tender-no, distended-no, bowel sounds-present, HSM- no Br/ Gen/ Rectal- Not done, not indicated Extrem- cyanosis- none, clubbing, none, atrophy- none, strength- nl Neuro- grossly intact to observation  Assessment & Plan:

## 2010-12-17 NOTE — Patient Instructions (Signed)
You are doing very well with CPAP. It would work better, and you might be able to get off CPAP someday if you could lose enough weight. That should be a goal for you to work on.

## 2010-12-18 NOTE — Assessment & Plan Note (Signed)
Support for weight loss effort was discussed.

## 2011-12-03 ENCOUNTER — Emergency Department (HOSPITAL_COMMUNITY)
Admission: EM | Admit: 2011-12-03 | Discharge: 2011-12-03 | Disposition: A | Discharge status: Home / Self Care | Payer: Self-pay | Attending: Emergency Medicine | Admitting: Emergency Medicine

## 2011-12-03 DIAGNOSIS — I509 Heart failure, unspecified: Secondary | ICD-10-CM | POA: Insufficient documentation

## 2011-12-03 DIAGNOSIS — G4733 Obstructive sleep apnea (adult) (pediatric): Secondary | ICD-10-CM | POA: Insufficient documentation

## 2011-12-03 DIAGNOSIS — Z833 Family history of diabetes mellitus: Secondary | ICD-10-CM | POA: Insufficient documentation

## 2011-12-03 DIAGNOSIS — E669 Obesity, unspecified: Secondary | ICD-10-CM | POA: Insufficient documentation

## 2011-12-03 DIAGNOSIS — T169XXA Foreign body in ear, unspecified ear, initial encounter: Secondary | ICD-10-CM | POA: Insufficient documentation

## 2011-12-03 DIAGNOSIS — I5032 Chronic diastolic (congestive) heart failure: Secondary | ICD-10-CM | POA: Insufficient documentation

## 2011-12-03 DIAGNOSIS — IMO0002 Reserved for concepts with insufficient information to code with codable children: Secondary | ICD-10-CM | POA: Insufficient documentation

## 2011-12-03 NOTE — ED Notes (Signed)
The cotton of a q-tip got stuck in rt. Ear.

## 2011-12-03 NOTE — ED Provider Notes (Signed)
History     CSN: [id number]  Arrival date & time 12/03/11  1718   First MD Initiated Contact with Patient 12/03/11 1758      Chief Complaint  Patient presents with  . Foreign Body in [place]    (Consider location/radiation/quality/duration/timing/severity/associated sxs/prior treatment) HPI Comments:  and patient presents emergency department with chief complaint of Q-tip, and getting stuck in right ear.  Patient was seen and evaluated in triage as well as having the foreign body removed while in triage.  Patient has no complaints this time including denying fevers, night sweats, chills, draining from your, tinnitus, decreased hearing, or otalgia.  No other complaints at this time.    Patient is a 51 y.o. male presenting with foreign body in ear.  Foreign Body in [place]    Past Medical History  Diagnosis Date  . Hypertrophy of tonsils alone   . Chronic diastolic heart failure   . Obesity, unspecified   . Obstructive sleep apnea (adult) (pediatric)     Past Surgical History  Procedure Date  . Umbilical hernia repair     Family History  Problem Relation Age of Onset  . Diabetes Brother     History  Substance Use Topics  . Smoking status: Never Smoker   . Smokeless tobacco: Never Used  . Alcohol Use: No      Review of Systems  All other systems reviewed and are negative.    Allergies  Review of patient's allergies indicates no known allergies.  Home Medications   Current Outpatient Rx  Name Route Sig Dispense Refill  . AMLODIPINE BESYLATE 10 MG PO TABS Oral Take 10 mg by mouth daily.      . ASPIRIN 325 MG PO TABS Oral Take 325 mg by mouth daily.      . ATENOLOL 50 MG PO TABS Oral Take 25 mg by mouth daily.      . ATORVASTATIN CALCIUM 10 MG PO TABS Oral Take 10 mg by mouth daily.      Marland Kitchen LISINOPRIL 40 MG PO TABS Oral Take 40 mg by mouth daily.        BP 169/102  Pulse 104  Temp 98.4 F (36.9 C) (Oral)  Resp 22  SpO2 100%  Physical Exam  Nursing note and  vitals reviewed. Constitutional: He is oriented to person, place, and time. He appears well-developed and well-nourished. No distress.  HENT:  Head: Normocephalic and atraumatic.       External ear canals normal as well as tympanic membranes bilaterally.  No evidence of foreign body.  Finger rub audible bilaterally.  Eyes: Conjunctivae and EOM are normal.  Neck: Normal range of motion.  Pulmonary/Chest: Effort normal.  Musculoskeletal: Normal range of motion.  Neurological: He is alert and oriented to person, place, and time.  Skin: Skin is warm and dry. No rash noted. He is not diaphoretic.  Psychiatric: He has a normal mood and affect. His behavior is normal.    ED Course  Procedures (including critical care time)  Labs Reviewed - No data to display No results found.   No diagnosis found.    MDM  Foreign body removal  At this time there does not appear to be any evidence of an acute emergency medical condition and the patient appears stable for discharge with appropriate outpatient follow up.his        Verl Dicker, PA-C 12/03/11 1807

## 2011-12-04 NOTE — ED Provider Notes (Signed)
Medical screening examination/treatment/procedure(s) were performed by non-physician practitioner and as supervising physician I was immediately available for consultation/collaboration.    Kathalene Frames, MD 12/04/11 [phone]

## 2011-12-16 ENCOUNTER — Ambulatory Visit: Payer: Self-pay | Admitting: Internal Medicine

## 2012-10-17 ENCOUNTER — Emergency Department (HOSPITAL_COMMUNITY): Payer: BC Managed Care – PPO

## 2012-10-17 ENCOUNTER — Emergency Department (HOSPITAL_COMMUNITY)
Admission: EM | Admit: 2012-10-17 | Discharge: 2012-10-17 | Disposition: A | Discharge status: Home / Self Care | Payer: BC Managed Care – PPO | Attending: Emergency Medicine | Admitting: Emergency Medicine

## 2012-10-17 ENCOUNTER — Encounter (HOSPITAL_COMMUNITY): Payer: Self-pay | Admitting: Emergency Medicine

## 2012-10-17 DIAGNOSIS — R05 Cough: Secondary | ICD-10-CM | POA: Insufficient documentation

## 2012-10-17 DIAGNOSIS — R079 Chest pain, unspecified: Secondary | ICD-10-CM | POA: Insufficient documentation

## 2012-10-17 DIAGNOSIS — E669 Obesity, unspecified: Secondary | ICD-10-CM | POA: Insufficient documentation

## 2012-10-17 DIAGNOSIS — I509 Heart failure, unspecified: Secondary | ICD-10-CM

## 2012-10-17 DIAGNOSIS — Z8669 Personal history of other diseases of the nervous system and sense organs: Secondary | ICD-10-CM | POA: Insufficient documentation

## 2012-10-17 DIAGNOSIS — R059 Cough, unspecified: Secondary | ICD-10-CM | POA: Insufficient documentation

## 2012-10-17 DIAGNOSIS — I5032 Chronic diastolic (congestive) heart failure: Secondary | ICD-10-CM | POA: Insufficient documentation

## 2012-10-17 DIAGNOSIS — R51 Headache: Secondary | ICD-10-CM | POA: Insufficient documentation

## 2012-10-17 DIAGNOSIS — M7989 Other specified soft tissue disorders: Secondary | ICD-10-CM | POA: Insufficient documentation

## 2012-10-17 DIAGNOSIS — Z8709 Personal history of other diseases of the respiratory system: Secondary | ICD-10-CM | POA: Insufficient documentation

## 2012-10-17 LAB — URINALYSIS, ROUTINE W REFLEX MICROSCOPIC
Glucose, UA: NEGATIVE mg/dL
Leukocytes, UA: NEGATIVE
Protein, ur: 100 mg/dL — AB
Specific Gravity, Urine: 1.009 (ref 1.005–1.030)
pH: 7 (ref 5.0–8.0)

## 2012-10-17 LAB — CBC WITH DIFFERENTIAL/PLATELET
Basophils Absolute: 0 10*3/uL (ref 0.0–0.1)
Basophils Relative: 1 % (ref 0–1)
Eosinophils Absolute: 0.1 10*3/uL (ref 0.0–0.7)
Eosinophils Relative: 1 % (ref 0–5)
Lymphs Abs: 1.5 10*3/uL (ref 0.7–4.0)
MCH: 26.3 pg (ref 26.0–34.0)
MCV: 82.9 fL (ref 78.0–100.0)
Neutrophils Relative %: 65 % (ref 43–77)
Platelets: 247 10*3/uL (ref 150–400)
RBC: 5.55 MIL/uL (ref 4.22–5.81)
RDW: 15.8 % — ABNORMAL HIGH (ref 11.5–15.5)

## 2012-10-17 LAB — COMPREHENSIVE METABOLIC PANEL
ALT: 40 U/L (ref 0–53)
AST: 40 U/L — ABNORMAL HIGH (ref 0–37)
Albumin: 2.8 g/dL — ABNORMAL LOW (ref 3.5–5.2)
Alkaline Phosphatase: 48 U/L (ref 39–117)
Calcium: 8.7 mg/dL (ref 8.4–10.5)
GFR calc Af Amer: 75 mL/min — ABNORMAL LOW (ref >90)
Glucose, Bld: 88 mg/dL (ref 70–99)
Potassium: 4 mEq/L (ref 3.5–5.1)
Sodium: 139 mEq/L (ref 135–145)
Total Protein: 7.8 g/dL (ref 6.0–8.3)

## 2012-10-17 LAB — URINE MICROSCOPIC-ADD ON

## 2012-10-17 MED ORDER — FUROSEMIDE 10 MG/ML IJ SOLN
60.0000 mg | Freq: Once | INTRAMUSCULAR | Status: AC
  Administered 2012-10-17: 60 mg via INTRAVENOUS
  Filled 2012-10-17: qty 6

## 2012-10-17 MED ORDER — ATORVASTATIN CALCIUM 10 MG PO TABS: 10.0000 mg | ORAL_TABLET | Freq: Every day | ORAL | Status: DC

## 2012-10-17 MED ORDER — ASPIRIN 325 MG PO TABS: 325.0000 mg | ORAL_TABLET | Freq: Every day | ORAL | Status: DC

## 2012-10-17 MED ORDER — AMLODIPINE BESYLATE 10 MG PO TABS: 10.0000 mg | ORAL_TABLET | Freq: Every day | ORAL | Status: DC

## 2012-10-17 MED ORDER — ATENOLOL 50 MG PO TABS: 25.0000 mg | ORAL_TABLET | Freq: Every day | ORAL | Status: DC

## 2012-10-17 MED ORDER — FUROSEMIDE 20 MG PO TABS: 20.0000 mg | ORAL_TABLET | Freq: Every day | ORAL | Status: DC

## 2012-10-17 MED ORDER — LISINOPRIL 40 MG PO TABS: 40.0000 mg | ORAL_TABLET | Freq: Every day | ORAL | Status: DC

## 2012-10-17 NOTE — ED Provider Notes (Addendum)
History     CSN: [id number]  Arrival date & time 10/17/12  [id number]   First MD Initiated Contact with Patient 10/17/12 [phone]      Chief Complaint  Patient presents with  . Shortness of Breath    (Consider location/radiation/quality/duration/timing/severity/associated sxs/prior treatment) HPI Pt with history of CHF followed by [facility]ology presents for unspecified period with lower ext swelling, DOE, and episodic CP. No pain currently. Pt states he has been out of his meds for "some time". No fever chills. +non-productive cough. Pt states he has not been using his CPAP and is sleeping little. C/o gradual onset diffuse HA without focal weakness or numbness.  Past Medical History  Diagnosis Date  . Hypertrophy of tonsils alone   . Chronic diastolic heart failure   . Obesity, unspecified   . Obstructive sleep apnea (adult) (pediatric)     Past Surgical History  Procedure Laterality Date  . Umbilical hernia repair      Family History  Problem Relation Age of Onset  . Diabetes Brother     History  Substance Use Topics  . Smoking status: Never Smoker   . Smokeless tobacco: Never Used  . Alcohol Use: No      Review of Systems  Constitutional: Negative for fever and chills.  HENT: Negative for sore throat, neck pain and neck stiffness.   Respiratory: Positive for cough and shortness of breath. Negative for wheezing.   Cardiovascular: Positive for chest pain and leg swelling. Negative for palpitations.  Gastrointestinal: Negative for nausea, vomiting and abdominal pain.  Genitourinary: Negative for dysuria and flank pain.  Musculoskeletal: Negative for back pain.  Skin: Negative for pallor, rash and wound.  Neurological: Negative for dizziness, weakness, light-headedness and numbness.  All other systems reviewed and are negative.    Allergies  Review of patient's allergies indicates no known allergies.  Home Medications   Current Outpatient Rx  Name  Route  Sig   Dispense  Refill  . amLODipine (NORVASC) 10 MG tablet   Oral   Take 1 tablet (10 mg total) by mouth daily.   30 tablet   0   . aspirin 325 MG tablet   Oral   Take 1 tablet (325 mg total) by mouth daily.   30 tablet   0   . atenolol (TENORMIN) 50 MG tablet   Oral   Take 0.5 tablets (25 mg total) by mouth daily.   30 tablet   0   . atorvastatin (LIPITOR) 10 MG tablet   Oral   Take 1 tablet (10 mg total) by mouth daily.   30 tablet   0   . furosemide (LASIX) 20 MG tablet   Oral   Take 1 tablet (20 mg total) by mouth daily.   30 tablet   0   . lisinopril (PRINIVIL,ZESTRIL) 40 MG tablet   Oral   Take 1 tablet (40 mg total) by mouth daily.   30 tablet   0     BP 135/92  Pulse 68  Temp(Src) 98.3 F (36.8 C) (Oral)  Resp 23  Ht 6\' 2"  (1.88 m)  Wt 394 lb (178.717 kg)  BMI 50.57 kg/m2  SpO2 94%  Physical Exam  Nursing note and vitals reviewed. Constitutional: He is oriented to person, place, and time. He appears well-developed and well-nourished. No distress.  obese  HENT:  Head: Normocephalic and atraumatic.  Mouth/Throat: Oropharynx is clear and moist. No oropharyngeal exudate.  Eyes: EOM are normal. Pupils are equal,  round, and reactive to light.  Neck: Normal range of motion. Neck supple.  Cardiovascular: Normal rate and regular rhythm.   Pulmonary/Chest: Effort normal and breath sounds normal. No respiratory distress. He has no wheezes. He has no rales.  Abdominal: Soft. Bowel sounds are normal.  Musculoskeletal: Normal range of motion. He exhibits no tenderness. Edema: +3 bl pitting   No calf tenderness  Neurological: He is alert and oriented to person, place, and time.  5/5 motor in all ext, sensation intact  Skin: Skin is warm and dry. No rash noted. No erythema.  Psychiatric: He has a normal mood and affect. His behavior is normal.    ED Course  Procedures (including critical care time)  Labs Reviewed  CBC WITH DIFFERENTIAL - Abnormal; Notable  for the following:    RDW 15.8 (*)    All other components within normal limits  COMPREHENSIVE METABOLIC PANEL - Abnormal; Notable for the following:    Albumin 2.8 (*)    AST 40 (*)    GFR calc non Af Amer 65 (*)    GFR calc Af Amer 75 (*)    All other components within normal limits  PRO B NATRIURETIC PEPTIDE - Abnormal; Notable for the following:    Pro B Natriuretic peptide (BNP) 863.9 (*)    All other components within normal limits  URINALYSIS, ROUTINE W REFLEX MICROSCOPIC - Abnormal; Notable for the following:    Hgb urine dipstick SMALL (*)    Protein, ur 100 (*)    All other components within normal limits  TROPONIN I  URINE MICROSCOPIC-ADD ON   Dg Chest Port 1 View  10/17/2012   *RADIOLOGY REPORT*  Clinical Data: Chest pain.  Shortness of breath.  Lower extremity edema.  PORTABLE CHEST - 1 VIEW 10/17/2012 0913 hours:  Comparison: Two-view chest x-ray 06/21/2008, 09/12/2006.  Findings: Cardiac silhouette moderately enlarged but stable, allowing for differences in technique.  Pulmonary venous hypertension and mild diffuse interstitial pulmonary edema. Atelectasis at the left lung base with chronic elevation of the left hemidiaphragm.  IMPRESSION: Stable cardiomegaly.  Pulmonary venous hypertension and minimal interstitial pulmonary edema consistent with minimal CHF. Atelectasis at the left lung base with stable chronic elevation of the left hemidiaphragm.   Original Report Authenticated By: Evangeline Dakin, M.D.     1. CHF exacerbation       MDM   Pt remains pain free in ED. SOB improved. Has diuresised > 1000cc. Ambulating without desaturation. Wil refill meds and have encouraged to f/u with his cardiologist. Return precautions given       Julianne Rice, MD 10/17/12 1426   Date: 10/17/2012  Rate: 77  Rhythm: normal sinus rhythm  QRS Axis: normal  Intervals: normal  ST/T Wave abnormalities: nonspecific T wave changes  Conduction Disutrbances:none  Narrative  Interpretation:   Old EKG Reviewed: none available    Julianne Rice, MD 10/17/12 1429

## 2012-10-17 NOTE — ED Notes (Signed)
Dr Yelverton at bedside.  

## 2012-10-17 NOTE — ED Notes (Signed)
Pt presents to ED today with c/o shortness of breath for 2 days, headache for 2 days, and swelling in legs and scrotum for 2 days. NAD

## 2012-10-17 NOTE — ED Notes (Signed)
PT ambulated with pulse ox . Pt 94% on room air.

## 2012-12-11 ENCOUNTER — Inpatient Hospital Stay (HOSPITAL_COMMUNITY)
Admission: EM | Admit: 2012-12-11 | Discharge: 2012-12-17 | DRG: 127 | Disposition: A | Discharge status: Home / Self Care | Payer: BC Managed Care – PPO | Attending: Cardiovascular Disease | Admitting: Cardiovascular Disease

## 2012-12-11 ENCOUNTER — Emergency Department (HOSPITAL_COMMUNITY): Payer: BC Managed Care – PPO

## 2012-12-11 ENCOUNTER — Encounter (HOSPITAL_COMMUNITY): Payer: Self-pay | Admitting: *Deleted

## 2012-12-11 DIAGNOSIS — I509 Heart failure, unspecified: Secondary | ICD-10-CM | POA: Diagnosis present

## 2012-12-11 DIAGNOSIS — I5033 Acute on chronic diastolic (congestive) heart failure: Principal | ICD-10-CM | POA: Diagnosis present

## 2012-12-11 DIAGNOSIS — Z6841 Body Mass Index (BMI) 40.0 and over, adult: Secondary | ICD-10-CM

## 2012-12-11 DIAGNOSIS — T502X5A Adverse effect of carbonic-anhydrase inhibitors, benzothiadiazides and other diuretics, initial encounter: Secondary | ICD-10-CM | POA: Diagnosis present

## 2012-12-11 DIAGNOSIS — Z7982 Long term (current) use of aspirin: Secondary | ICD-10-CM

## 2012-12-11 DIAGNOSIS — E873 Alkalosis: Secondary | ICD-10-CM | POA: Diagnosis present

## 2012-12-11 DIAGNOSIS — E662 Morbid (severe) obesity with alveolar hypoventilation: Secondary | ICD-10-CM | POA: Diagnosis present

## 2012-12-11 DIAGNOSIS — I5043 Acute on chronic combined systolic (congestive) and diastolic (congestive) heart failure: Secondary | ICD-10-CM

## 2012-12-11 DIAGNOSIS — G4733 Obstructive sleep apnea (adult) (pediatric): Secondary | ICD-10-CM | POA: Diagnosis present

## 2012-12-11 DIAGNOSIS — I10 Essential (primary) hypertension: Secondary | ICD-10-CM | POA: Diagnosis present

## 2012-12-11 DIAGNOSIS — Z91199 Patient's noncompliance with other medical treatment and regimen due to unspecified reason: Secondary | ICD-10-CM

## 2012-12-11 DIAGNOSIS — Z9119 Patient's noncompliance with other medical treatment and regimen: Secondary | ICD-10-CM

## 2012-12-11 HISTORY — DX: Heart failure, unspecified: I50.9

## 2012-12-11 LAB — BASIC METABOLIC PANEL
CO2: 35 mEq/L — ABNORMAL HIGH (ref 19–32)
Calcium: 8.8 mg/dL (ref 8.4–10.5)
Chloride: 102 mEq/L (ref 96–112)
Potassium: 4.3 mEq/L (ref 3.5–5.1)
Sodium: 142 mEq/L (ref 135–145)

## 2012-12-11 LAB — URINALYSIS, ROUTINE W REFLEX MICROSCOPIC
Ketones, ur: NEGATIVE mg/dL
Leukocytes, UA: NEGATIVE
Nitrite: NEGATIVE
pH: 5.5 (ref 5.0–8.0)

## 2012-12-11 LAB — URINE MICROSCOPIC-ADD ON

## 2012-12-11 LAB — CBC
Hemoglobin: 13.5 g/dL (ref 13.0–17.0)
MCH: 24.9 pg — ABNORMAL LOW (ref 26.0–34.0)
Platelets: 271 10*3/uL (ref 150–400)
RBC: 5.42 MIL/uL (ref 4.22–5.81)
WBC: 4 10*3/uL (ref 4.0–10.5)

## 2012-12-11 LAB — POCT I-STAT TROPONIN I: Troponin i, poc: 0.03 ng/mL (ref 0.00–0.08)

## 2012-12-11 LAB — PRO B NATRIURETIC PEPTIDE: Pro B Natriuretic peptide (BNP): 2359 pg/mL — ABNORMAL HIGH (ref 0–125)

## 2012-12-11 MED ORDER — FUROSEMIDE 10 MG/ML IJ SOLN
80.0000 mg | Freq: Once | INTRAMUSCULAR | Status: AC
  Administered 2012-12-11: 80 mg via INTRAVENOUS
  Filled 2012-12-11: qty 8

## 2012-12-11 MED ORDER — NITROGLYCERIN 2 % TD OINT
1.0000 [in_us] | TOPICAL_OINTMENT | Freq: Once | TRANSDERMAL | Status: AC
  Administered 2012-12-11: 1 [in_us] via TOPICAL
  Filled 2012-12-11: qty 1

## 2012-12-11 NOTE — H&P (Signed)
History and Physical   Patient ID: CHERYL PEFLEY MRN: [id number], DOB/AGE: [birth year] 52 y.o. Date of Encounter: 12/11/2012  Primary Physician: Birdie Riddle, MD Primary Cardiologist:   Chief Complaint:  Worsening dyspnea on exertion w/ significant LE swelling.  HPI:  Mr. JHOSTIN DENAULT is a 52 y.o. y/o male w/ PMHx of diastolic heart failure, OSA (poorly compliant w/ CPAP, and morbid obesity , presents to the ED w/ complaints of worsening SOB on exertion. He said this has been going on for a couple months and is to the point that he can only walk 10 steps before he needs to stop and catch is breath. He also describes some associated lower extremity swelling that usually goes away but has only been getting worse recently. He has had these issues in the past, but it has never been this bad before. He also describes some associated lightheadedness, as well as PND and orthopnea to the point where he has to sleep in a chair most of the time. He does not complain of chest pain, cough, dizziness, excessive diaphoresis, nausea, vomiting, fever, chills, or LOC.   Past Medical History  Diagnosis Date  . Hypertrophy of tonsils alone   . Chronic diastolic heart failure   . Obesity, unspecified   . Obstructive sleep apnea (adult) (pediatric)   . CHF (congestive heart failure)     Surgical History:  Past Surgical History  Procedure Laterality Date  . Umbilical hernia repair       I have reviewed the patient's current medications. Prior to Admission medications   Medication Sig Start Date End Date Taking? Authorizing Provider  acetaminophen (TYLENOL) 325 MG tablet Take 325 mg by mouth every morning.    Yes Historical Provider, MD  amLODipine (NORVASC) 10 MG tablet Take 1 tablet (10 mg total) by mouth daily. 10/17/12  Yes Julianne Rice, MD  aspirin 325 MG tablet Take 1 tablet (325 mg total) by mouth daily. 10/17/12  Yes Julianne Rice, MD  atenolol (TENORMIN) 50 MG tablet Take 0.5 tablets  (25 mg total) by mouth daily. 10/17/12  Yes Julianne Rice, MD  atorvastatin (LIPITOR) 10 MG tablet Take 1 tablet (10 mg total) by mouth daily. 10/17/12  Yes Julianne Rice, MD  furosemide (LASIX) 20 MG tablet Take 1 tablet (20 mg total) by mouth daily. 10/17/12  Yes Julianne Rice, MD  lisinopril (PRINIVIL,ZESTRIL) 40 MG tablet Take 1 tablet (40 mg total) by mouth daily. 10/17/12  Yes Julianne Rice, MD   Scheduled Meds: Continuous Infusions: PRN Meds:.   Allergies: No Known Allergies  History   Social History  . Marital Status: Single    Spouse Name: N/A    Number of Children: 0  . Years of Education: N/A   Occupational History  . works as a Garment/textile technologist History Main Topics  . Smoking status: Never Smoker   . Smokeless tobacco: Never Used  . Alcohol Use: No  . Drug Use: Not on file  . Sexually Active: Not on file   Other Topics Concern  . Not on file   Social History Narrative  . No narrative on file    Family History  Problem Relation Age of Onset  . Diabetes Brother    Family Status  Relation Status Death Age  . Mother Deceased 95  . Father Alive     age unknown  . Brother Alive     Review of Systems: General: Denies fever, chills, diaphoresis, appetite change and fatigue.  HEENT: Denies change in vision, eye pain, redness, hearing loss, congestion, sore throat, rhinorrhea, sneezing, mouth sores, trouble swallowing, neck pain, neck stiffness and tinnitus.   Respiratory: Complains of sever DOE. Denies chest tightness, wheezing or cough.  Cardiovascular: Complains of worsening LE swelling. Denies chest pain or palpitations. Gastrointestinal: Denies nausea, vomiting, abdominal pain.  Genitourinary: Denies dysuria, urgency, frequency, hematuria, flank pain and difficulty urinating.  Endocrine: Denies hot or cold intolerance, sweats, polyuria, polydipsia. Musculoskeletal: Denies myalgias, back pain, joint swelling, arthralgias and gait problem.  Skin: Denies  pallor, rash and wounds.  Neurological: Complains of lightheadedness w/ episodes of SOB. Denies dizziness, seizures, syncope, weakness,  numbness and headaches.  Hematological: Denies adenopathy,easy bruising, personal or family bleeding history.  Psychiatric/Behavioral: Denies mood changes, confusion, nervousness, sleep disturbance and agitation.  Physical Exam: Blood pressure 121/82, pulse 59, temperature 98.1 F (36.7 C), temperature source Oral, resp. rate 22, SpO2 86.00%. General: Vital signs reviewed.  Patient is a well-developed and well-nourished, in no acute distress and cooperative with exam. Alert and oriented x3.  Head: Normocephalic and atraumatic. Nose: No erythema or drainage noted.  Turbinates normal. Mouth: No erythema, exudates, sores, or ulcerations. Moist mucus membranes. Eyes: PERRL, EOMI, conjunctivae normal, No scleral icterus.  Neck: JVD present. Supple, trachea midline, normal ROM, masses, thyromegaly, or carotid bruit present.  Cardiovascular: RRR, S1 normal, S2 normal, no murmurs, gallops, or rubs. Pulmonary/Chest: normal respiratory effort, CTAB, no wheezes, rales, or rhonchi. Abdominal: Soft. Non-tender, non-distended, bowel sounds are normal, no masses, organomegaly, or guarding present.  GU: No CVA tenderness. Musculoskeletal: No joint deformities, erythema, or stiffness, ROM full and no nontender. Extremities: +3 LE edema extending up to lower back. Pulses present bilaterally in UE's, not able to be assessed in LE's d/t significant edema. No cyanosis or clubbing. Neurological: A&O x3, Strength is normal and symmetric bilaterally, cranial nerve II-XII are grossly intact, no focal motor deficit, sensory intact to light touch bilaterally.  Skin: Warm, dry and intact. No rashes or erythema. Psychiatric: Normal mood and affect. speech and behavior is normal. Cognition and memory are normal.   Labs:   Lab Results  Component Value Date   WBC 4.0 12/11/2012   HGB  13.5 12/11/2012   HCT 42.8 12/11/2012   MCV 79.0 12/11/2012   PLT 271 12/11/2012   No results found for this basename: INR,  in the last 72 hours  Recent Labs Lab 12/11/12 1009  NA 142  K 4.3  CL 102  CO2 35*  BUN 17  CREATININE 1.43*  CALCIUM 8.8  GLUCOSE 97   No results found for this basename: CKTOTAL, CKMB, TROPONINI,  in the last 72 hours  Recent Labs  12/11/12 1025  TROPIPOC 0.03   Lab Results  Component Value Date   CHOL 126 04/30/2010   HDL 52 04/30/2010   LDLCALC 65 04/30/2010   TRIG 45 04/30/2010   Pro B Natriuretic peptide (BNP)  Date/Time Value Range Status  12/11/2012 10:18 AM 2359.0* 0 - 125 pg/mL Final  10/17/2012  9:03 AM 863.9* 0 - 125 pg/mL Final   No results found for this basename: DDIMER    Radiology/Studies: Dg Chest Portable 1 View  12/11/2012   *RADIOLOGY REPORT*  Clinical Data: Shortness of breath  PORTABLE CHEST - 1 VIEW  Comparison: 10/17/2012; 09/12/2006; chest CT of 09/12/2006  Findings:  Grossly unchanged enlarged cardiac silhouette and mediastinal contours given decreased lung volumes and patient rotation.  There is chronic cephalization of flow indistinct pulmonary vasculature. Grossly  unchanged bilateral hilar and medial basilar heterogeneous opacities.  Trace bilateral effusions are not excluded.  No definite pneumothorax.  Unchanged bones.  IMPRESSION: 1.  Suspected pulmonary edema and bibasilar atelectasis on this hypoventilated AP portable examination.  Further evaluation with a PA and lateral chest radiograph may be obtained as clinically indicated.  2.  Persistent findings of marked cardiomegaly.  Further evaluation with cardiac echo may be performed as clinically indicated.   Original Report Authenticated By: Jake Seats, MD   Echo: 02/22/2009  Study Conclusions: 1. Left ventricle: The cavity size was normal. Wall thickness was normal. Systolic function was normal. The estimated ejection fraction was in the range of 55% to 60%. Images were  inadequate for LV wall motion assessment. Doppler parameters are consistent with abnormal left ventricular relaxation (grade 1 diastolic dysfunction). 2. Aortic valve: There was no stenosis. 3. Mitral valve: No significant regurgitation. 4. Left atrium: The atrium was mildly dilated. 5. Right ventricle: The cavity size was normal. Systolic function was normal. 6. Pulmonary arteries: No TR doppler jet so unable to estimate PA systolic pressure. 7. Inferior vena cava: The vessel was normal in size; the respirophasic diameter changes were in the normal range (= 50%); findings are consistent with normal central venous pressure. Impressions:  - Technically difficult study with poor acoustic windows. The LV was not well seen. Grossly, systolic function appears normal. The RV appears normal. There are no significant valvular abnormalities.  ECG: NSR @ 64 BPM  ASSESSMENT AND PLAN:  52 y.o. y/o male w/ PMHx of diastolic heart failure, OSA, and morbid obesity , presents to the ED w/ complaints of worsening SOB on exertion, and significant LE edema extending to lower back. Most likely due to CHF exacerbation. Received 80 mg of Lasix in ED. Continue to diurese. Obtain 2 view CXR to assess extent of possible pulmonary effusion. Plan for 2D ECHO (last one was 2010) to assess current LV function.  Cleda Mccreedy, MD 12/11/2012 5:02 PM  Attending Note:    Pt sees Dr. Birdie Riddle, MD.  The patient was seen and examined.  Agree with assessment and plan as noted above.  Changes made to the above note as needed.  Hx of morbid obesity, OSA ( does not wear CPAP), diet  Pt has been noncompliant with his meds and CPAP mask.   He presented with gradual worsening dyspnea.    He has already diuresed 2 liters this afternoon and feels better.  Still needs diuresis.   The ER called [place] and our resident did the admission not realizing that he was a patient of Dr. Doylene Canard.  Will page Dr. Doylene Canard  and inform him of admission.    He needs additional diuresis.  We have stressed the importance of salt restriction, exercise, weight loss and CPAP mask.  He will make the needed changes.   Thayer Headings, Brooke Bonito., MD, [place] 12/11/2012, 6:06 PM

## 2012-12-11 NOTE — ED Notes (Signed)
Pt presents to department for evaluation of increased SOB. Ongoing x1 month. Also states bilateral lower leg swelling. States SOB becomes worse with exertion. Denies chest pain at the time. Also states several episodes of diarrhea today. Pt is alert and oriented x4. Rhonchi heard all lung fields.

## 2012-12-11 NOTE — ED Notes (Signed)
IV team paged.  

## 2012-12-11 NOTE — ED Notes (Signed)
Pt informed again of need for urine specimen

## 2012-12-11 NOTE — ED Notes (Signed)
Reports having sob for extended amount of time, has hx of chf and reports recent swelling to extremities. Also reports having diarrhea for several days. Airway intact at triage

## 2012-12-11 NOTE — ED Provider Notes (Signed)
History    CSN: [id number] Arrival date & time 12/11/12  [id number]  First MD Initiated Contact with Patient 12/11/12 1006     Chief Complaint  Patient presents with  . Shortness of Breath   (Consider location/radiation/quality/duration/timing/severity/associated sxs/prior Treatment) HPI Comments: Patient is a 52 year old man who has a history of congestive heart failure for 6 or 8 years. Now he has been short of breath, has noticed some diarrhea also.  Patient is a 52 y.o. male presenting with shortness of breath.  Shortness of Breath Severity:  Moderate Onset quality:  Gradual Duration: He has chronic congestive failure, with worsening over the past 2 or 3 days. Timing:  Constant Progression:  Worsening Chronicity:  Chronic Context comment:  He is short of breath after taking 5 or 6 steps. Relieved by:  Nothing Worsened by:  Nothing tried Ineffective treatments: He has taken his regular medicines, but they are not working as well right now. Associated symptoms: no chest pain and no fever   Risk factors comment:  He has known ischemic cardiomyopathy.  Past Medical History  Diagnosis Date  . Hypertrophy of tonsils alone   . Chronic diastolic heart failure   . Obesity, unspecified   . Obstructive sleep apnea (adult) (pediatric)   . CHF (congestive heart failure)    Past Surgical History  Procedure Laterality Date  . Umbilical hernia repair     Family History  Problem Relation Age of Onset  . Diabetes Brother    History  Substance Use Topics  . Smoking status: Never Smoker   . Smokeless tobacco: Never Used  . Alcohol Use: No    Review of Systems  Constitutional: Negative for fever and chills.  HENT: Negative.   Eyes: Negative.   Respiratory: Positive for shortness of breath.   Cardiovascular: Positive for leg swelling. Negative for chest pain and palpitations.  Gastrointestinal: Positive for diarrhea.  Genitourinary: Negative.   Musculoskeletal: Negative.    Skin: Negative.   Neurological: Negative.   Psychiatric/Behavioral: Negative.     Allergies  Review of patient's allergies indicates no known allergies.  Home Medications   Current Outpatient Rx  Name  Route  Sig  Dispense  Refill  . acetaminophen (TYLENOL) 325 MG tablet   Oral   Take 325 mg by mouth every morning.          Marland Kitchen amLODipine (NORVASC) 10 MG tablet   Oral   Take 1 tablet (10 mg total) by mouth daily.   30 tablet   0   . aspirin 325 MG tablet   Oral   Take 1 tablet (325 mg total) by mouth daily.   30 tablet   0   . atenolol (TENORMIN) 50 MG tablet   Oral   Take 0.5 tablets (25 mg total) by mouth daily.   30 tablet   0   . atorvastatin (LIPITOR) 10 MG tablet   Oral   Take 1 tablet (10 mg total) by mouth daily.   30 tablet   0   . furosemide (LASIX) 20 MG tablet   Oral   Take 1 tablet (20 mg total) by mouth daily.   30 tablet   0   . lisinopril (PRINIVIL,ZESTRIL) 40 MG tablet   Oral   Take 1 tablet (40 mg total) by mouth daily.   30 tablet   0    BP 132/67  Pulse 69  Temp(Src) 98.1 F (36.7 C) (Oral)  Resp 20  SpO2 88% Physical Exam  Nursing note and vitals reviewed. Constitutional:  Morbidly obese man in no distress at rest.  HENT:  Head: Normocephalic and atraumatic.  Right Ear: External ear normal.  Left Ear: External ear normal.  Mouth/Throat: Oropharynx is clear and moist.  Eyes: Conjunctivae and EOM are normal. Pupils are equal, round, and reactive to light.  Neck: Normal range of motion. Neck supple.  Cardiovascular: Normal rate, regular rhythm and normal heart sounds.   Pulmonary/Chest: Effort normal.  Bibasilar rales.  Abdominal: Soft. Bowel sounds are normal.  Musculoskeletal: He exhibits edema.  He has chronic 3-4+ edema in both legs. He has skin changes because of the chronicity of his edema.  Skin: Skin is warm and dry.  Psychiatric: He has a normal mood and affect. His behavior is normal.    ED Course   Procedures (including critical care time)  11:14 AM  Date: 12/11/2012  Rate: 64  Rhythm: normal sinus rhythm  QRS Axis: normal  Intervals: normal QRS:  Low voltage QRS  ST/T Wave abnormalities: normal  Conduction Disutrbances:none  Narrative Interpretation: Borderline EKG  Old EKG Reviewed: unchanged    Dg Chest Portable 1 View  12/11/2012   *RADIOLOGY REPORT*  Clinical Data: Shortness of breath  PORTABLE CHEST - 1 VIEW  Comparison: 10/17/2012; 09/12/2006; chest CT of 09/12/2006  Findings:  Grossly unchanged enlarged cardiac silhouette and mediastinal contours given decreased lung volumes and patient rotation.  There is chronic cephalization of flow indistinct pulmonary vasculature. Grossly unchanged bilateral hilar and medial basilar heterogeneous opacities.  Trace bilateral effusions are not excluded.  No definite pneumothorax.  Unchanged bones.  IMPRESSION: 1.  Suspected pulmonary edema and bibasilar atelectasis on this hypoventilated AP portable examination.  Further evaluation with a PA and lateral chest radiograph may be obtained as clinically indicated.  2.  Persistent findings of marked cardiomegaly.  Further evaluation with cardiac echo may be performed as clinically indicated.   Original Report Authenticated By: Jake Seats, MD   Results for orders placed during the hospital encounter of 12/11/12  CBC      Result Value Range   WBC 4.0  4.0 - 10.5 K/uL   RBC 5.42  4.22 - 5.81 MIL/uL   Hemoglobin 13.5  13.0 - 17.0 g/dL   HCT 42.8  39.0 - 52.0 %   MCV 79.0  78.0 - 100.0 fL   MCH 24.9 (*) 26.0 - 34.0 pg   MCHC 31.5  30.0 - 36.0 g/dL   RDW 17.6 (*) 11.5 - 15.5 %   Platelets 271  150 - 400 K/uL  BASIC METABOLIC PANEL      Result Value Range   Sodium 142  135 - 145 mEq/L   Potassium 4.3  3.5 - 5.1 mEq/L   Chloride 102  96 - 112 mEq/L   CO2 35 (*) 19 - 32 mEq/L   Glucose, Bld 97  70 - 99 mg/dL   BUN 17  6 - 23 mg/dL   Creatinine, Ser 1.43 (*) 0.50 - 1.35 mg/dL   Calcium  8.8  8.4 - 10.5 mg/dL   GFR calc non Af Amer 56 (*) >90 mL/min   GFR calc Af Amer 65 (*) >90 mL/min  PRO B NATRIURETIC PEPTIDE      Result Value Range   Pro B Natriuretic peptide (BNP) 2359.0 (*) 0 - 125 pg/mL  POCT I-STAT TROPONIN I      Result Value Range   Troponin i, poc 0.03  0.00 - 0.08 ng/mL  Comment 3            Dg Chest Portable 1 View  12/11/2012   *RADIOLOGY REPORT*  Clinical Data: Shortness of breath  PORTABLE CHEST - 1 VIEW  Comparison: 10/17/2012; 09/12/2006; chest CT of 09/12/2006  Findings:  Grossly unchanged enlarged cardiac silhouette and mediastinal contours given decreased lung volumes and patient rotation.  There is chronic cephalization of flow indistinct pulmonary vasculature. Grossly unchanged bilateral hilar and medial basilar heterogeneous opacities.  Trace bilateral effusions are not excluded.  No definite pneumothorax.  Unchanged bones.  IMPRESSION: 1.  Suspected pulmonary edema and bibasilar atelectasis on this hypoventilated AP portable examination.  Further evaluation with a PA and lateral chest radiograph may be obtained as clinically indicated.  2.  Persistent findings of marked cardiomegaly.  Further evaluation with cardiac echo may be performed as clinically indicated.   Original Report Authenticated By: Jake Seats, MD   Course in ED:  Lab tests showed CHF. Patient was given IV Lasix and topical nitroglycerin. The [facility] cardiology group say that pt is Dr. Krista Blue patient.  Spoke to Dr. Einar Gip, who says he has never seen pt.  Requested [facility] to see pt, put him in the [facility].    1. Congestive heart failure (CHF), acute on chronic, combined        Mylinda Latina III, MD 12/12/12 [phone]

## 2012-12-11 NOTE — ED Provider Notes (Signed)
Patient reports to me that his breathing is at baseline since treatment in the emergency department he is alert ambulatory without difficulty. Patient reports that he's been noncompliant with all of his medicines until 2 days ago.  Adam Dakin, MD 12/12/12 [phone]

## 2012-12-11 NOTE — ED Notes (Signed)
Cardiology at bedside.

## 2012-12-12 LAB — BASIC METABOLIC PANEL
CO2: 33 mEq/L — ABNORMAL HIGH (ref 19–32)
Calcium: 8.5 mg/dL (ref 8.4–10.5)
Chloride: 100 mEq/L (ref 96–112)
Creatinine, Ser: 1.34 mg/dL (ref 0.50–1.35)
GFR calc Af Amer: 70 mL/min — ABNORMAL LOW (ref >90)
Potassium: 4.1 mEq/L (ref 3.5–5.1)
Sodium: 144 mEq/L (ref 135–145)

## 2012-12-12 LAB — CBC
HCT: 43.5 % (ref 39.0–52.0)
MCV: 80.4 fL (ref 78.0–100.0)
Platelets: 264 10*3/uL (ref 150–400)
RBC: 5.41 MIL/uL (ref 4.22–5.81)
RDW: 17.8 % — ABNORMAL HIGH (ref 11.5–15.5)
WBC: 4.7 10*3/uL (ref 4.0–10.5)

## 2012-12-12 LAB — LIPID PANEL
Cholesterol: 102 mg/dL (ref 0–200)
Triglycerides: 83 mg/dL (ref <150)
VLDL: 17 mg/dL (ref 0–40)

## 2012-12-12 MED ORDER — ASPIRIN 325 MG PO TABS
325.0000 mg | ORAL_TABLET | Freq: Every day | ORAL | Status: DC
  Filled 2012-12-12: qty 1

## 2012-12-12 MED ORDER — ONDANSETRON HCL 4 MG/2ML IJ SOLN: 4.0000 mg | Freq: Four times a day (QID) | INTRAMUSCULAR | Status: DC | PRN

## 2012-12-12 MED ORDER — PNEUMOCOCCAL VAC POLYVALENT 25 MCG/0.5ML IJ INJ
0.5000 mL | INJECTION | INTRAMUSCULAR | Status: AC
  Administered 2012-12-13: 0.5 mL via INTRAMUSCULAR
  Filled 2012-12-12: qty 0.5

## 2012-12-12 MED ORDER — ASPIRIN EC 81 MG PO TBEC
81.0000 mg | DELAYED_RELEASE_TABLET | Freq: Every day | ORAL | Status: DC
  Administered 2012-12-12 – 2012-12-17 (×6): 81 mg via ORAL
  Filled 2012-12-12 (×6): qty 1

## 2012-12-12 MED ORDER — POTASSIUM CHLORIDE CRYS ER 20 MEQ PO TBCR
20.0000 meq | EXTENDED_RELEASE_TABLET | Freq: Two times a day (BID) | ORAL | Status: DC
  Administered 2012-12-12 – 2012-12-14 (×7): 20 meq via ORAL
  Filled 2012-12-12 (×9): qty 1

## 2012-12-12 MED ORDER — ASPIRIN 300 MG RE SUPP
300.0000 mg | RECTAL | Status: AC
  Filled 2012-12-12: qty 1

## 2012-12-12 MED ORDER — ACETAMINOPHEN 325 MG PO TABS: 650.0000 mg | ORAL_TABLET | ORAL | Status: DC | PRN

## 2012-12-12 MED ORDER — ACETAZOLAMIDE 250 MG PO TABS
500.0000 mg | ORAL_TABLET | Freq: Two times a day (BID) | ORAL | Status: AC
  Administered 2012-12-12 – 2012-12-13 (×4): 500 mg via ORAL
  Filled 2012-12-12 (×4): qty 2

## 2012-12-12 MED ORDER — ASPIRIN 81 MG PO CHEW
324.0000 mg | CHEWABLE_TABLET | ORAL | Status: AC
  Administered 2012-12-12: 324 mg via ORAL

## 2012-12-12 MED ORDER — ATORVASTATIN CALCIUM 10 MG PO TABS
10.0000 mg | ORAL_TABLET | Freq: Every day | ORAL | Status: DC
  Administered 2012-12-13 – 2012-12-17 (×5): 10 mg via ORAL
  Filled 2012-12-12 (×6): qty 1

## 2012-12-12 MED ORDER — FUROSEMIDE 10 MG/ML IJ SOLN
40.0000 mg | Freq: Two times a day (BID) | INTRAMUSCULAR | Status: DC
  Administered 2012-12-12 – 2012-12-16 (×9): 40 mg via INTRAVENOUS
  Filled 2012-12-12 (×11): qty 4

## 2012-12-12 MED ORDER — ENOXAPARIN SODIUM 40 MG/0.4ML ~~LOC~~ SOLN
40.0000 mg | SUBCUTANEOUS | Status: DC
  Administered 2012-12-12 – 2012-12-17 (×6): 40 mg via SUBCUTANEOUS
  Filled 2012-12-12 (×7): qty 0.4

## 2012-12-12 MED ORDER — AMLODIPINE BESYLATE 10 MG PO TABS
10.0000 mg | ORAL_TABLET | Freq: Every day | ORAL | Status: DC
  Administered 2012-12-13 – 2012-12-17 (×5): 10 mg via ORAL
  Filled 2012-12-12 (×6): qty 1

## 2012-12-12 MED ORDER — NITROGLYCERIN 0.4 MG SL SUBL: 0.4000 mg | SUBLINGUAL_TABLET | SUBLINGUAL | Status: DC | PRN

## 2012-12-12 MED ORDER — LISINOPRIL 40 MG PO TABS
40.0000 mg | ORAL_TABLET | Freq: Every day | ORAL | Status: DC
  Administered 2012-12-13 – 2012-12-17 (×5): 40 mg via ORAL
  Filled 2012-12-12 (×6): qty 1

## 2012-12-12 MED ORDER — ACETAMINOPHEN 325 MG PO TABS
325.0000 mg | ORAL_TABLET | Freq: Every morning | ORAL | Status: DC
  Administered 2012-12-13 – 2012-12-17 (×5): 325 mg via ORAL
  Filled 2012-12-12 (×4): qty 1

## 2012-12-12 MED ORDER — ATENOLOL 25 MG PO TABS
25.0000 mg | ORAL_TABLET | Freq: Every day | ORAL | Status: DC
  Administered 2012-12-13 – 2012-12-17 (×5): 25 mg via ORAL
  Filled 2012-12-12 (×6): qty 1

## 2012-12-12 MED ORDER — ASPIRIN 81 MG PO CHEW
CHEWABLE_TABLET | ORAL | Status: AC
  Filled 2012-12-12: qty 4

## 2012-12-12 NOTE — Progress Notes (Signed)
Patient his home meds at 10:30 and was informed not to do this since he would be getting some the same meds here.  Meds moved to closet and he agrees he will not do it again.  Alert and oriented.  Obtained bariatric be for client since he weights more the be allowance, but, he remains on the regular.  Oncoming shift notified.

## 2012-12-12 NOTE — Progress Notes (Signed)
Subjective:  Patient denies any chest pains states breathing has improved  Objective:  Vital Signs in the last 24 hours: Temp:  [97.8 F (36.6 C)-97.9 F (36.6 C)] 97.9 F (36.6 C) (07/19 0508) Pulse Rate:  [57-67] 61 (07/19 0508) Resp:  [10-24] 18 (07/19 0508) BP: (121-148)/(63-99) 122/63 mmHg (07/19 0508) SpO2:  [86 %-100 %] 100 % (07/19 0508) FiO2 (%):  [2 %] 2 % (07/19 0508) Weight:  [174.6 kg (384 lb 14.8 oz)] 174.6 kg (384 lb 14.8 oz) (07/19 0508)  Intake/Output from previous day: 07/18 0701 - 07/19 0700 In: 300 [P.O.:300] Out: 3900 [Urine:3900] Intake/Output from this shift:    Physical Exam: Neck: no adenopathy, no carotid bruit and supple, symmetrical, trachea midline Lungs: Bibasilar rales noted Heart: regular rate and rhythm, S1, S2 normal and Soft systolic murmur and S3 gallop noted Abdomen: Soft obese nontender Extremities: No clubbing cyanosis 4+ edema noted  Lab Results:  Recent Labs  12/11/12 1009 12/12/12 0615  WBC 4.0 4.7  HGB 13.5 13.4  PLT 271 264    Recent Labs  12/11/12 1009 12/12/12 0615  NA 142 144  K 4.3 4.1  CL 102 100  CO2 35* 33*  GLUCOSE 97 94  BUN 17 16  CREATININE 1.43* 1.34   No results found for this basename: TROPONINI, CK, MB,  in the last 72 hours Hepatic Function Panel No results found for this basename: PROT, ALBUMIN, AST, ALT, ALKPHOS, BILITOT, BILIDIR, IBILI,  in the last 72 hours  Recent Labs  12/12/12 0754  CHOL 102   No results found for this basename: PROTIME,  in the last 72 hours  Imaging: Imaging results have been reviewed and Dg Chest Portable 1 View  12/11/2012   *RADIOLOGY REPORT*  Clinical Data: Shortness of breath  PORTABLE CHEST - 1 VIEW  Comparison: 10/17/2012; 09/12/2006; chest CT of 09/12/2006  Findings:  Grossly unchanged enlarged cardiac silhouette and mediastinal contours given decreased lung volumes and patient rotation.  There is chronic cephalization of flow indistinct pulmonary  vasculature. Grossly unchanged bilateral hilar and medial basilar heterogeneous opacities.  Trace bilateral effusions are not excluded.  No definite pneumothorax.  Unchanged bones.  IMPRESSION: 1.  Suspected pulmonary edema and bibasilar atelectasis on this hypoventilated AP portable examination.  Further evaluation with a PA and lateral chest radiograph may be obtained as clinically indicated.  2.  Persistent findings of marked cardiomegaly.  Further evaluation with cardiac echo may be performed as clinically indicated.   Original Report Authenticated By: Jake Seats, MD    Cardiac Studies:  Assessment/Plan:  Acute decompensated diastolic heart failure Hypertension Morbid obesity Obstructive sleep apnea/obesity hypoventilation syndrome Metabolic alkalosis secondary to diuretics Plan Start acetazolamide as per orders Check labs in a.m. Check old records  LOS: 1 day    Adam Dennis N 12/12/2012, 10:41 AM

## 2012-12-13 LAB — PRO B NATRIURETIC PEPTIDE: Pro B Natriuretic peptide (BNP): 1211 pg/mL — ABNORMAL HIGH (ref 0–125)

## 2012-12-13 LAB — BASIC METABOLIC PANEL
CO2: 39 mEq/L — ABNORMAL HIGH (ref 19–32)
Chloride: 97 mEq/L (ref 96–112)
Glucose, Bld: 87 mg/dL (ref 70–99)
Sodium: 143 mEq/L (ref 135–145)

## 2012-12-13 LAB — CBC
Hemoglobin: 13.2 g/dL (ref 13.0–17.0)
Platelets: 280 10*3/uL (ref 150–400)
RBC: 5.51 MIL/uL (ref 4.22–5.81)
WBC: 5.2 10*3/uL (ref 4.0–10.5)

## 2012-12-13 NOTE — Progress Notes (Signed)
Legs wrapped with ace bandages to assist with reducing edema.  Both legs are tight to touch.

## 2012-12-13 NOTE — Progress Notes (Signed)
Subjective:  Patient denies any chest pain states breathing has improved so is his leg swelling  Objective:  Vital Signs in the last 24 hours: Temp:  [97.8 F (36.6 C)-97.9 F (36.6 C)] 97.9 F (36.6 C) (07/20 0549) Pulse Rate:  [62-64] 64 (07/20 0549) Resp:  [18-20] 18 (07/20 0549) BP: (116-132)/(53-83) 116/53 mmHg (07/20 0549) SpO2:  [91 %-94 %] 91 % (07/20 0549) Weight:  [172.458 kg (380 lb 3.2 oz)] 172.458 kg (380 lb 3.2 oz) (07/20 0549)  Intake/Output from previous day: 07/19 0701 - 07/20 0700 In: 960 [P.O.:960] Out: 725 [Urine:725] Intake/Output from this shift: Total I/O In: 480 [P.O.:480] Out: 700 [Urine:700]  Physical Exam: Neck: no adenopathy, no carotid bruit, no JVD and supple, symmetrical, trachea midline Lungs: Bi basilar rales noted Heart: regular rate and rhythm, S1, S2 normal and Soft systolic murmur and S3 gallop noted Abdomen: soft, non-tender; bowel sounds normal; no masses,  no organomegaly Extremities: No clubbing cyanosis 4+ edema with chronic dermatosis changes noted  Lab Results:  Recent Labs  12/12/12 0615 12/13/12 0355  WBC 4.7 5.2  HGB 13.4 13.2  PLT 264 280    Recent Labs  12/12/12 0615 12/13/12 0355  NA 144 143  K 4.1 4.0  CL 100 97  CO2 33* 39*  GLUCOSE 94 87  BUN 16 16  CREATININE 1.34 1.43*   No results found for this basename: TROPONINI, CK, MB,  in the last 72 hours Hepatic Function Panel No results found for this basename: PROT, ALBUMIN, AST, ALT, ALKPHOS, BILITOT, BILIDIR, IBILI,  in the last 72 hours  Recent Labs  12/12/12 0754  CHOL 102   No results found for this basename: PROTIME,  in the last 72 hours  Imaging: Imaging results have been reviewed and No results found.  Cardiac Studies:  Assessment/Plan:  Resolving Acute decompensated diastolic heart failure  Hypertension  Morbid obesity  Obstructive sleep apnea/obesity hypoventilation syndrome  Metabolic alkalosis secondary to diuretics   Plan Continue present management Check labs in a.m.  LOS: 2 days    Benno Brensinger N 12/13/2012, 11:34 AM

## 2012-12-14 LAB — PRO B NATRIURETIC PEPTIDE: Pro B Natriuretic peptide (BNP): 1249 pg/mL — ABNORMAL HIGH (ref 0–125)

## 2012-12-14 LAB — BASIC METABOLIC PANEL
CO2: 40 mEq/L (ref 19–32)
Chloride: 97 mEq/L (ref 96–112)
Glucose, Bld: 110 mg/dL — ABNORMAL HIGH (ref 70–99)
Potassium: 3.7 mEq/L (ref 3.5–5.1)
Sodium: 140 mEq/L (ref 135–145)

## 2012-12-14 NOTE — Progress Notes (Signed)
CO2 40, yesterday 39, pt may benefit with c-pap at night, will continue to monitor, Thanks. Arvella Nigh RN

## 2012-12-14 NOTE — Progress Notes (Signed)
Subjective:  Resting comfortably. Afebrile.  Objective:  Vital Signs in the last 24 hours: Temp:  [98 F (36.7 C)-98.4 F (36.9 C)] 98.3 F (36.8 C) (07/21 [id number]) Pulse Rate:  [69-99] 73 (07/21 [id number]) Cardiac Rhythm:  [-] Normal sinus rhythm (07/21 0810) Resp:  [17-20] 17 (07/21 0613) BP: (116-139)/(62-72) 129/62 mmHg (07/21 0613) SpO2:  [96 %-100 %] 100 % (07/21 0613) Weight:  [170.371 kg (375 lb 9.6 oz)] 170.371 kg (375 lb 9.6 oz) (07/21 [id number])  Physical Exam: BP Readings from Last 1 Encounters:  12/14/12 129/62     Wt Readings from Last 1 Encounters:  12/14/12 170.371 kg (375 lb 9.6 oz)    Weight change: -2.087 kg (-4 lb 9.6 oz)  HEENT: [place]/AT, Eyes-Brown, PERL, EOMI, Conjunctiva-Pink, Sclera-Non-icteric Neck: No JVD, No bruit, Trachea midline. Lungs:  Clear, Bilateral. Cardiac:  Regular rhythm, normal S1 and S2, no S3.  Abdomen:  Soft, non-tender. Extremities: 1 + edema present. No cyanosis. No clubbing. CNS: AxOx3, Cranial nerves grossly intact, moves all 4 extremities. Right handed. Skin: Warm and dry.    Intake/Output from previous day: 07/20 0701 - 07/21 0700 In: 1822 [P.O.:1822] Out: 2775 [Urine:2775]    Lab Results: BMET    Component Value Date/Time   NA 140 12/14/2012 0640   K 3.7 12/14/2012 0640   CL 97 12/14/2012 0640   CO2 40* 12/14/2012 0640   GLUCOSE 110* 12/14/2012 0640   BUN 15 12/14/2012 0640   CREATININE 1.36* 12/14/2012 0640   CALCIUM 8.5 12/14/2012 0640   GFRNONAA 59* 12/14/2012 0640   GFRAA 69* 12/14/2012 0640   CBC    Component Value Date/Time   WBC 5.2 12/13/2012 0355   RBC 5.51 12/13/2012 0355   HGB 13.2 12/13/2012 0355   HCT 46.3 12/13/2012 0355   PLT 280 12/13/2012 0355   MCV 84.0 12/13/2012 0355   MCH 24.0* 12/13/2012 0355   MCHC 28.5* 12/13/2012 0355   RDW 18.1* 12/13/2012 0355   LYMPHSABS 1.5 10/17/2012 0903   MONOABS 0.8 10/17/2012 0903   EOSABS 0.1 10/17/2012 0903   BASOSABS 0.0 10/17/2012 0903   CARDIAC ENZYMES Lab Results  Component  Value Date   TROPONINI <0.30 10/17/2012    Scheduled Meds: . acetaminophen  325 mg Oral q morning - 10a  . amLODipine  10 mg Oral Daily  . aspirin EC  81 mg Oral Daily  . atenolol  25 mg Oral Daily  . atorvastatin  10 mg Oral Daily  . enoxaparin (LOVENOX) injection  40 mg Subcutaneous Q24H  . furosemide  40 mg Intravenous BID  . lisinopril  40 mg Oral Daily  . potassium chloride  20 mEq Oral BID   Continuous Infusions:  PRN Meds:.acetaminophen, nitroGLYCERIN, ondansetron (ZOFRAN) IV  Assessment/Plan: Resolving Acute decompensated diastolic heart failure  Hypertension  Morbid obesity  Obstructive sleep apnea/obesity hypoventilation syndrome  Metabolic alkalosis secondary to diuretics   Continue medical treatment   LOS: 3 days    Dixie Dials  MD  12/14/2012, 9:39 AM

## 2012-12-15 LAB — BASIC METABOLIC PANEL
CO2: 35 mEq/L — ABNORMAL HIGH (ref 19–32)
Calcium: 8.5 mg/dL (ref 8.4–10.5)
Chloride: 99 mEq/L (ref 96–112)
Potassium: 3.4 mEq/L — ABNORMAL LOW (ref 3.5–5.1)
Sodium: 138 mEq/L (ref 135–145)

## 2012-12-15 MED ORDER — POTASSIUM CHLORIDE CRYS ER 20 MEQ PO TBCR
20.0000 meq | EXTENDED_RELEASE_TABLET | Freq: Four times a day (QID) | ORAL | Status: DC
  Administered 2012-12-15 – 2012-12-17 (×8): 20 meq via ORAL
  Filled 2012-12-15 (×11): qty 1

## 2012-12-15 NOTE — Plan of Care (Signed)
Problem: Phase I Progression Outcomes Goal: EF % per last Echo/documented,Core Reminder form on chart 55-60% from 2013

## 2012-12-15 NOTE — Progress Notes (Signed)
Utilization Review Completed.   Jessaca Philippi, RN, BSN Nurse Case Manager  [phone]  

## 2012-12-15 NOTE — Progress Notes (Signed)
Subjective:  Awake. Not ambulating much.  Objective:  Vital Signs in the last 24 hours: Temp:  [97.4 F (36.3 C)-98.2 F (36.8 C)] 97.4 F (36.3 C) (07/22 0555) Pulse Rate:  [66-77] 66 (07/22 0555) Cardiac Rhythm:  [-] Normal sinus rhythm (07/22 0849) Resp:  [20] 20 (07/22 0555) BP: (103-136)/(57-86) 111/57 mmHg (07/22 0555) SpO2:  [93 %-97 %] 94 % (07/22 0555) Weight:  [198.2 kg (436 lb 15.2 oz)] 198.2 kg (436 lb 15.2 oz) (07/22 0555)  Physical Exam: BP Readings from Last 1 Encounters:  12/15/12 111/57     Wt Readings from Last 1 Encounters:  12/15/12 198.2 kg (436 lb 15.2 oz)    Weight change: 27.829 kg (61 lb 5.6 oz)  HEENT: [place]/AT, Eyes-Brown, PERL, EOMI, Conjunctiva-Pink, Sclera-Non-icteric Neck: No JVD, No bruit, Trachea midline. Lungs:  Clear, Bilateral. Cardiac:  Regular rhythm, normal S1 and S2, no S3.  Abdomen:  Soft, non-tender. Extremities:  1 + edema present. No cyanosis. No clubbing. CNS: AxOx3, Cranial nerves grossly intact, moves all 4 extremities. Right handed. Skin: Warm and dry.   Intake/Output from previous day: 07/21 0701 - 07/22 0700 In: 1080 [P.O.:1080] Out: 3000 [Urine:3000]    Lab Results: BMET    Component Value Date/Time   NA 138 12/15/2012 0500   K 3.4* 12/15/2012 0500   CL 99 12/15/2012 0500   CO2 35* 12/15/2012 0500   GLUCOSE 87 12/15/2012 0500   BUN 14 12/15/2012 0500   CREATININE 1.22 12/15/2012 0500   CALCIUM 8.5 12/15/2012 0500   GFRNONAA 68* 12/15/2012 0500   GFRAA 78* 12/15/2012 0500   CBC    Component Value Date/Time   WBC 5.2 12/13/2012 0355   RBC 5.51 12/13/2012 0355   HGB 13.2 12/13/2012 0355   HCT 46.3 12/13/2012 0355   PLT 280 12/13/2012 0355   MCV 84.0 12/13/2012 0355   MCH 24.0* 12/13/2012 0355   MCHC 28.5* 12/13/2012 0355   RDW 18.1* 12/13/2012 0355   LYMPHSABS 1.5 10/17/2012 0903   MONOABS 0.8 10/17/2012 0903   EOSABS 0.1 10/17/2012 0903   BASOSABS 0.0 10/17/2012 0903   CARDIAC ENZYMES Lab Results  Component Value Date   TROPONINI <0.30 10/17/2012    Scheduled Meds: . acetaminophen  325 mg Oral q morning - 10a  . amLODipine  10 mg Oral Daily  . aspirin EC  81 mg Oral Daily  . atenolol  25 mg Oral Daily  . atorvastatin  10 mg Oral Daily  . enoxaparin (LOVENOX) injection  40 mg Subcutaneous Q24H  . furosemide  40 mg Intravenous BID  . lisinopril  40 mg Oral Daily  . potassium chloride  20 mEq Oral QID   Continuous Infusions:  PRN Meds:.acetaminophen, nitroGLYCERIN, ondansetron (ZOFRAN) IV  Assessment/Plan:  Acute decompensated diastolic heart failure  Hypertension  Morbid obesity  Obstructive sleep apnea/obesity hypoventilation syndrome  Metabolic alkalosis secondary to diuretics   Increase ambulation. Repeat daily weight on same machine.   LOS: 4 days    Dixie Dials  MD  12/15/2012, 10:03 AM

## 2012-12-15 NOTE — Progress Notes (Signed)
  Echocardiogram 2D Echocardiogram has been performed.  Adam Dennis 12/15/2012, 10:51 AM

## 2012-12-15 NOTE — Progress Notes (Signed)
Pt setting up in bed. States Lt arm feels weak, Md aware. No other complaints at this time

## 2012-12-15 NOTE — Progress Notes (Addendum)
Tolerated CPAP at night no c/o SOB. NSR . No complaints offered. Continued to monitor. One time episode of bradycardia non sustained HR dropped to 38 then went back right away in the 70's. Pt was asymptomatic no c/o sob nor chest pains.continued to observe patient

## 2012-12-16 LAB — BASIC METABOLIC PANEL
CO2: 35 mEq/L — ABNORMAL HIGH (ref 19–32)
Chloride: 100 mEq/L (ref 96–112)
GFR calc non Af Amer: 68 mL/min — ABNORMAL LOW (ref >90)
Glucose, Bld: 89 mg/dL (ref 70–99)
Potassium: 3.5 mEq/L (ref 3.5–5.1)
Sodium: 140 mEq/L (ref 135–145)

## 2012-12-16 MED ORDER — MAGNESIUM HYDROXIDE 400 MG/5ML PO SUSP: 15.0000 mL | Freq: Every day | ORAL | Status: DC | PRN

## 2012-12-16 MED ORDER — FUROSEMIDE 10 MG/ML IJ SOLN
40.0000 mg | Freq: Every day | INTRAMUSCULAR | Status: DC
  Administered 2012-12-17: 40 mg via INTRAVENOUS
  Filled 2012-12-16: qty 4

## 2012-12-16 NOTE — Progress Notes (Signed)
Ace wraps removed so Pt could take bath. LE +2/3 edema

## 2012-12-16 NOTE — Plan of Care (Signed)
Problem: Phase I Progression Outcomes Goal: EF % per last Echo/documented,Core Reminder form on chart 55-60% 01/2012

## 2012-12-16 NOTE — Progress Notes (Signed)
Leg rewrapped after bath. Pt ambulated self down hall no complaints of pain or sob.

## 2012-12-16 NOTE — Progress Notes (Signed)
Pt up eating breakfast. No complaints of pain or sob. Unable to assess LE edema d/t wraps. Pt weight down 7kg in 2days.

## 2012-12-16 NOTE — Progress Notes (Deleted)
PT was placed on her cpap with 3L O2 bleed in

## 2012-12-16 NOTE — Progress Notes (Signed)
Subjective:  Breathing better.  Objective:  Vital Signs in the last 24 hours: Temp:  [98 F (36.7 C)-98.3 F (36.8 C)] 98 F (36.7 C) (07/23 0339) Pulse Rate:  [65-83] 74 (07/23 0339) Cardiac Rhythm:  [-] Normal sinus rhythm (07/23 0809) Resp:  [18-20] 18 (07/23 0824) BP: (116-133)/(79-98) 133/82 mmHg (07/23 0339) SpO2:  [92 %-98 %] 92 % (07/23 0824) Weight:  [163.2 kg (359 lb 12.7 oz)-163.3 kg (360 lb 0.2 oz)] 163.2 kg (359 lb 12.7 oz) (07/23 0339)  Physical Exam: BP Readings from Last 1 Encounters:  12/16/12 133/82     Wt Readings from Last 1 Encounters:  12/16/12 163.2 kg (359 lb 12.7 oz)    Weight change: -34.9 kg (-76 lb 15.1 oz)  HEENT: [place]/AT, Eyes-Brown, PERL, EOMI, Conjunctiva-Pink, Sclera-Non-icteric Neck: No JVD, No bruit, Trachea midline. Lungs:  Clear, Bilateral. Cardiac:  Regular rhythm, normal S1 and S2, no S3.  Abdomen:  Soft, non-tender. Extremities:  1 + edema present. No cyanosis. No clubbing. CNS: AxOx3, Cranial nerves grossly intact, moves all 4 extremities. Right handed. Skin: Warm and dry.   Intake/Output from previous day: 07/22 0701 - 07/23 0700 In: 1280 [P.O.:1280] Out: 5650 [Urine:5650]    Lab Results: BMET    Component Value Date/Time   NA 140 12/16/2012 0515   K 3.5 12/16/2012 0515   CL 100 12/16/2012 0515   CO2 35* 12/16/2012 0515   GLUCOSE 89 12/16/2012 0515   BUN 14 12/16/2012 0515   CREATININE 1.22 12/16/2012 0515   CALCIUM 8.5 12/16/2012 0515   GFRNONAA 68* 12/16/2012 0515   GFRAA 78* 12/16/2012 0515   CBC    Component Value Date/Time   WBC 5.2 12/13/2012 0355   RBC 5.51 12/13/2012 0355   HGB 13.2 12/13/2012 0355   HCT 46.3 12/13/2012 0355   PLT 280 12/13/2012 0355   MCV 84.0 12/13/2012 0355   MCH 24.0* 12/13/2012 0355   MCHC 28.5* 12/13/2012 0355   RDW 18.1* 12/13/2012 0355   LYMPHSABS 1.5 10/17/2012 0903   MONOABS 0.8 10/17/2012 0903   EOSABS 0.1 10/17/2012 0903   BASOSABS 0.0 10/17/2012 0903   CARDIAC ENZYMES Lab Results   Component Value Date   TROPONINI <0.30 10/17/2012    Scheduled Meds: . acetaminophen  325 mg Oral q morning - 10a  . amLODipine  10 mg Oral Daily  . aspirin EC  81 mg Oral Daily  . atenolol  25 mg Oral Daily  . atorvastatin  10 mg Oral Daily  . enoxaparin (LOVENOX) injection  40 mg Subcutaneous Q24H  . [START ON 12/17/2012] furosemide  40 mg Intravenous Daily  . lisinopril  40 mg Oral Daily  . potassium chloride  20 mEq Oral QID   Continuous Infusions:  PRN Meds:.acetaminophen, magnesium hydroxide, nitroGLYCERIN, ondansetron (ZOFRAN) IV  Assessment/Plan:  Acute decompensated diastolic heart failure  Hypertension  Morbid obesity  Obstructive sleep apnea/obesity hypoventilation syndrome  Metabolic alkalosis secondary to diuretics   Increase activity. Decrease lasix use.   LOS: 5 days    Dixie Dials  MD  12/16/2012, 9:13 AM

## 2012-12-16 NOTE — Progress Notes (Signed)
12/16/12 1530 In to speak with pt. regarding financial questions, and DME issues.   Pt. states he applied for disability, however it was denied.  This NCM explained to pt. that he would have to call the number listed on his disability form for more information.  Pt. was concerned about having a new CPAP.  Pt. states he has a CPAP at his sister's house, for which he received about 7years ago, however he does not know whether it works or who he received the CPAP from.  Pt. will need a sleep study to obtain a new CPAP machine.  Physician, please write for sleep study and CPAP machine. Llana Aliment, RN, BSN NCM [phone]

## 2012-12-16 NOTE — Progress Notes (Signed)
PT refused his CPAP, said he wanted to wear his [place] instead. He was sating 98%

## 2012-12-16 NOTE — Progress Notes (Signed)
Iv team page to change IV dated 18th

## 2012-12-17 LAB — BASIC METABOLIC PANEL
BUN: 13 mg/dL (ref 6–23)
CO2: 32 mEq/L (ref 19–32)
Chloride: 101 mEq/L (ref 96–112)
GFR calc Af Amer: >90 mL/min (ref >90)
Potassium: 4.7 mEq/L (ref 3.5–5.1)

## 2012-12-17 MED ORDER — NITROGLYCERIN 0.4 MG SL SUBL: 0.4000 mg | SUBLINGUAL_TABLET | SUBLINGUAL | Status: DC | PRN

## 2012-12-17 MED ORDER — FUROSEMIDE 40 MG PO TABS: 40.0000 mg | ORAL_TABLET | Freq: Every day | ORAL | Status: DC

## 2012-12-17 MED ORDER — POTASSIUM CHLORIDE CRYS ER 20 MEQ PO TBCR
20.0000 meq | EXTENDED_RELEASE_TABLET | Freq: Every day | ORAL | Status: DC
  Administered 2012-12-17: 20 meq via ORAL

## 2012-12-17 MED ORDER — POTASSIUM CHLORIDE CRYS ER 20 MEQ PO TBCR: 20.0000 meq | EXTENDED_RELEASE_TABLET | Freq: Every day | ORAL | Status: DC

## 2012-12-17 NOTE — Progress Notes (Signed)
Pt. Alert and oriented this am. No distress or discomfort noted. Pt. Denies pain. Pt. Up and ambulating in hallway. VSS. RN will continue to monitor pt. For changes in condition. Azadeh Hyder, Katherine Roan

## 2012-12-17 NOTE — Progress Notes (Signed)
12/17/12 1100 Noted [place] RN orders.  In to speak with pt. about home health and give choice.  Pt. chose [facility].  TC to [place], with [facility], to give referral.  Pt. to dc home today. Llana Aliment, RN, BSN NCM [phone]

## 2012-12-17 NOTE — Discharge Summary (Signed)
Physician Discharge Summary  Patient ID: Adam Dennis MRN: [id number] DOB/AGE: Dec 29, 1960 52 y.o.  Admit date: 12/11/2012 Discharge date: 12/17/2012  Admission Diagnoses: Acute decompensated diastolic heart failure  Hypertension  Morbid obesity  Obstructive sleep apnea/obesity hypoventilation syndrome  Metabolic alkalosis secondary to diuretics   Discharge Diagnoses:  Principle Problems: * Acute decompensated diastolic heart failure * Hypertension  Morbid obesity  Obstructive sleep apnea/obesity hypoventilation syndrome  Metabolic alkalosis secondary to diuretics    Discharged Condition: fair  Hospital Course: [name] is a 52 y.o. y/o male w/ PMHx of diastolic heart failure, OSA (poorly compliant w/ CPAP, and morbid obesity , presented to the ED w/ complaints of worsening SOB on exertion. His condition gradually improved with IV lasix and 12 Kg. Weight loss. His capacity to ambulate improved significantly and he was discharged home with home RN visit for diet and medication compliance.  Consults: Dietary  Significant Diagnostic Studies: labs: Near normal CBC and BMET. Elevated BNP, improved 50 % in 4 days.  EKG-NSR.  CXR: 1. Suspected pulmonary edema and bibasilar atelectasis on this hypoventilated AP portable examination. Further evaluation with a PA and lateral chest radiograph may be obtained as clinically indicated.  2. Persistent findings of marked cardiomegaly. Further evaluation with cardiac echo may be performed as clinically indicated.  Echocardiogram: Left ventricle: The cavity size was normal. Systolic function was moderately reduced. The estimated ejection fraction was in the range of 35% to 40%. There is moderate hypokinesis of the entireanterior and inferior myocardium. Doppler parameters are consistent with abnormal left ventricular relaxation (grade 1 diastolic dysfunction).  Treatments: cardiac meds: atenolol, lasix, lisinopril and  Lipitor.  Discharge Exam: Blood pressure 112/53, pulse 73, temperature 97.8 F (36.6 C), temperature source Oral, resp. rate 18, height 6\' 2"  (1.88 m), weight 162.07 kg (357 lb 4.8 oz), SpO2 90.00%. HEENT: [place]/AT, Eyes-Brown, PERL, EOMI, Conjunctiva-Pink, Sclera-Non-icteric  Neck: No JVD, No bruit, Trachea midline.  Lungs: Clear, Bilateral.  Cardiac: Regular rhythm, normal S1 and S2, no S3.  Abdomen: Soft, non-tender.  Extremities: Trace edema present. No cyanosis. No clubbing.  CNS: AxOx3, Cranial nerves grossly intact, moves all 4 extremities. Right handed.  Skin: Warm and dry.   Disposition: 01-Home or Self Care     Medication List         acetaminophen 325 MG tablet  Commonly known as:  TYLENOL  Take 325 mg by mouth every morning.     amLODipine 10 MG tablet  Commonly known as:  NORVASC  Take 1 tablet (10 mg total) by mouth daily.     aspirin 325 MG tablet  Take 1 tablet (325 mg total) by mouth daily.     atenolol 50 MG tablet  Commonly known as:  TENORMIN  Take 0.5 tablets (25 mg total) by mouth daily.     atorvastatin 10 MG tablet  Commonly known as:  LIPITOR  Take 1 tablet (10 mg total) by mouth daily.     furosemide 40 MG tablet  Commonly known as:  LASIX  Take 1 tablet (40 mg total) by mouth daily.     lisinopril 40 MG tablet  Commonly known as:  PRINIVIL,ZESTRIL  Take 1 tablet (40 mg total) by mouth daily.     nitroGLYCERIN 0.4 MG SL tablet  Commonly known as:  NITROSTAT  Place 1 tablet (0.4 mg total) under the tongue every 5 (five) minutes x 3 doses as needed for chest pain.     potassium chloride SA 20 MEQ  tablet  Commonly known as:  K-DUR,KLOR-CON  Take 1 tablet (20 mEq total) by mouth daily.         SignedDixie Dials S 12/17/2012, 9:48 AM

## 2012-12-17 NOTE — Progress Notes (Signed)
Verbalized understanding of discharge instructions.  HF booklet with client upon discharge and education reinforced.

## 2012-12-18 NOTE — Progress Notes (Signed)
Utilization Review Completed.   Maury Bamba, RN, BSN Nurse Case Manager  [phone]  

## 2012-12-24 ENCOUNTER — Ambulatory Visit (INDEPENDENT_AMBULATORY_CARE_PROVIDER_SITE_OTHER): Payer: BC Managed Care – PPO | Admitting: Adult Health

## 2012-12-24 ENCOUNTER — Encounter: Payer: Self-pay | Admitting: Adult Health

## 2012-12-24 VITALS — BP 112/74 | HR 62 | Temp 98.3°F | Ht 70.5 in | Wt 362.6 lb

## 2012-12-24 DIAGNOSIS — G4733 Obstructive sleep apnea (adult) (pediatric): Secondary | ICD-10-CM

## 2012-12-24 NOTE — Patient Instructions (Addendum)
Restart CPAP At bedtime.  Follow up Dr. Annamaria Boots  In 6 weeks .  Please contact office for sooner follow up if symptoms do not improve or worsen or seek emergency care

## 2012-12-25 NOTE — Progress Notes (Signed)
  Subjective:    Patient ID: Adam Dennis, male    DOB: 1961-02-11, 52 y.o.   MRN: [id number]  HPI 52 yo male with known hx of OSA on CPAP , complicated by obesity, HBP, chronic systolic/ diastolic heart failure  Last here 12/17/10-CPAP at 13 with nasal pillows  Never smoker  Echo 12/15/12 >EF 35% to 40%. There is moderate hypokinesis of the entireanterior and inferior myocardium.  grade 1 diastolic dysfunction   [id number] [facility] follow up  Recent admitted for decompensated diastolic heart failure with improvement after diuresis.  Since discharge feeling better with less dyspnea.  Returns to our office for follow up for OSA. Has not been wearing CPAP consistently. Needs new mask sent to DME. Unclear if machine is working properly.  No chest pain, increased edema or fever.    Review of Systems Constitutional:   No  weight loss, night sweats,  Fevers, chills, + fatigue, or  lassitude.  HEENT:   No headaches,  Difficulty swallowing,  Tooth/dental problems, or  Sore throat,                No sneezing, itching, ear ache, nasal congestion, post nasal drip,   CV:  No chest pain,   , PND, , anasarca, dizziness, palpitations, syncope.   GI  No heartburn, indigestion, abdominal pain, nausea, vomiting, diarrhea, change in bowel habits, loss of appetite, bloody stools.   Resp:   No coughing up of blood.  No change in color of mucus.  No wheezing.  No chest wall deformity  Skin: no rash or lesions.  GU: no dysuria, change in color of urine, no urgency or frequency.  No flank pain, no hematuria   MS:  No joint pain or swelling.  No decreased range of motion.  No back pain.  Psych:  No change in mood or affect. No depression or anxiety.  No memory loss.    '    Objective:   Physical Exam GEN: A/Ox3; pleasant , NAD morbidly obese   HEENT:  [place]/AT,  EACs-clear, TMs-wnl, NOSE-clear, THROAT-clear, no lesions, no postnasal drip or exudate noted.   NECK:  Supple w/ fair ROM; no JVD;  normal carotid impulses w/o bruits; no thyromegaly or nodules palpated; no lymphadenopathy.  RESP  Diminished BS in bases lear  P & A; w/o, wheezes/ rales/ or rhonchi.no accessory muscle use, no dullness to percussion  CARD:  RRR, no m/r/g  , 1+ peripheral edema, pulses intact, no cyanosis or clubbing.  GI:   Soft & nt; nml bowel sounds; no organomegaly or masses detected.  Musco: Warm bil, no deformities or joint swelling noted.   Neuro: alert, no focal deficits noted.    Skin: Warm, no lesions or rashes         Assessment & Plan:

## 2012-12-28 NOTE — Assessment & Plan Note (Addendum)
Restart CPAP At bedtime  Order sent to DME Wt loss encouraged  Follow up Dr. Annamaria Boots  In 6 weeks and As needed

## 2013-01-21 ENCOUNTER — Telehealth: Payer: Self-pay | Admitting: Internal Medicine

## 2013-01-21 DIAGNOSIS — G4733 Obstructive sleep apnea (adult) (pediatric): Secondary | ICD-10-CM

## 2013-01-21 NOTE — Telephone Encounter (Signed)
Order was sent to [facility] and staff msg sent to [facility]

## 2013-01-21 NOTE — Telephone Encounter (Signed)
Per [facility]-okay to order  

## 2013-01-21 NOTE — Telephone Encounter (Signed)
Please advise Dr. Annamaria Boots if okay to send in order. Pt last OV w/ Dr. Annamaria Boots 12/17/10 for OSA Had HFU w/ TP 12/24/12 Pending appt w/ Dr. Annamaria Boots 02/15/13

## 2013-02-15 ENCOUNTER — Encounter: Payer: Self-pay | Admitting: Internal Medicine

## 2013-02-15 ENCOUNTER — Ambulatory Visit (INDEPENDENT_AMBULATORY_CARE_PROVIDER_SITE_OTHER): Payer: BC Managed Care – PPO | Admitting: Internal Medicine

## 2013-02-15 VITALS — BP 122/76 | HR 57 | Ht 71.0 in | Wt 337.4 lb

## 2013-02-15 DIAGNOSIS — Z23 Encounter for immunization: Secondary | ICD-10-CM

## 2013-02-15 DIAGNOSIS — L738 Other specified follicular disorders: Secondary | ICD-10-CM

## 2013-02-15 DIAGNOSIS — L853 Xerosis cutis: Secondary | ICD-10-CM

## 2013-02-15 DIAGNOSIS — G4733 Obstructive sleep apnea (adult) (pediatric): Secondary | ICD-10-CM

## 2013-02-15 NOTE — Progress Notes (Signed)
Subjective:    Patient ID: Adam Dennis, male    DOB: 1960/07/30, 52 y.o.   MRN: [id number]  HPI 52 yo male with known hx of OSA on CPAP , complicated by obesity, HBP, chronic systolic/ diastolic heart failure  Last here 12/17/10-CPAP at 13 with nasal pillows  Never smoker  Echo 12/15/12 >EF 35% to 40%. There is moderate hypokinesis of the entireanterior and inferior myocardium.  grade 1 diastolic dysfunction   [id number] Post Hospital follow up  Recent admitted for decompensated diastolic heart failure with improvement after diuresis.  Since discharge feeling better with less dyspnea.  Returns to our office for follow up for OSA. Has not been wearing CPAP consistently. Needs new mask sent to DME. Unclear if machine is working properly.  No chest pain, increased edema or fever.   02/15/13- 52 yo male with known hx of OSA on CPAP , complicated by obesity, HBP, chronic systolic/ diastolic heart failure/ ischemic CM CPAP at 13/ [facility] with nasal pillows, full face mask. FOLLOWS [id number] CPAP every night for about 4-5 hours; pressure works well for patient;   ROS-see HPI Constitutional:   No-   weight loss, night sweats, fevers, chills, fatigue, lassitude. HEENT:   No-  headaches, difficulty swallowing, tooth/dental problems, sore throat,       No-  sneezing, itching, ear ache, nasal congestion, post nasal drip,  CV:  No-   chest pain, orthopnea, PND, +R calf swells- old injury, anasarca, dizziness, palpitations Resp: No-   shortness of breath with exertion or at rest.              No-   productive cough,  No non-productive cough,  No- coughing up of blood.              No-   change in color of mucus.  No- wheezing.   Skin: No-   rash or lesions. GI:  No-   heartburn, indigestion, abdominal pain, nausea, vomiting,  GU: . MS:  No-   joint pain or swelling.  Neuro-     nothing unusual Psych:  No- change in mood or affect. No depression or anxiety.  No memory loss. Objective:  OBJ-  Physical Exam General- Alert, Oriented, Affect-appropriate, Distress- none acute, obese Skin-  +Dry, excoriated skin on arms Lymphadenopathy- none Head- atraumatic            Eyes- Gross vision intact, PERRLA, conjunctivae and secretions clear            Ears- Hearing, canals-normal            Nose- Clear, no-Septal dev, mucus, polyps, erosion, perforation             Throat- Mallampati II , mucosa clear , drainage- none, tonsils- atrophic Neck- flexible , trachea midline, no stridor , thyroid nl, carotid no bruit Chest - symmetrical excursion , unlabored           Heart/CV- RRR , no murmur , no gallop  , no rub, nl s1 s2                           - JVD- none , edema- none, stasis changes- none, varices- none           Lung- clear to P&A, wheeze- none, cough- none , dullness-none, rub- none           Chest wall-  Abd-  Br/ Gen/ Rectal- Not done, not indicated  Extrem- cyanosis- none, clubbing, none, atrophy- none, strength- nl,    Neg- Homan's Neuro- grossly intact to observation    Assessment & Plan:

## 2013-02-15 NOTE — Patient Instructions (Addendum)
We can continue CPAP 13/ [facility]  Flu vax  Elevate legs when sitting, to prevent swelliing  Dry an otc lubricating skin lotion like Eucerin for dry skin

## 2013-02-26 DIAGNOSIS — L853 Xerosis cutis: Secondary | ICD-10-CM | POA: Insufficient documentation

## 2013-02-26 NOTE — Assessment & Plan Note (Signed)
Plan- Eucerin

## 2013-02-26 NOTE — Assessment & Plan Note (Signed)
Good CPAP compliance and control.

## 2013-05-13 ENCOUNTER — Ambulatory Visit: Payer: BC Managed Care – PPO

## 2013-05-14 ENCOUNTER — Ambulatory Visit: Payer: BC Managed Care – PPO | Admitting: Internal Medicine

## 2013-06-18 ENCOUNTER — Encounter: Payer: Self-pay | Admitting: Internal Medicine

## 2013-06-18 ENCOUNTER — Ambulatory Visit: Payer: BC Managed Care – PPO | Attending: Internal Medicine | Admitting: Internal Medicine

## 2013-06-18 VITALS — BP 117/77 | HR 63 | Temp 98.2°F | Resp 16 | Wt 325.2 lb

## 2013-06-18 DIAGNOSIS — I509 Heart failure, unspecified: Secondary | ICD-10-CM | POA: Insufficient documentation

## 2013-06-18 DIAGNOSIS — E669 Obesity, unspecified: Secondary | ICD-10-CM

## 2013-06-18 DIAGNOSIS — Z139 Encounter for screening, unspecified: Secondary | ICD-10-CM

## 2013-06-18 DIAGNOSIS — E785 Hyperlipidemia, unspecified: Secondary | ICD-10-CM | POA: Insufficient documentation

## 2013-06-18 DIAGNOSIS — I10 Essential (primary) hypertension: Secondary | ICD-10-CM | POA: Insufficient documentation

## 2013-06-18 DIAGNOSIS — G4733 Obstructive sleep apnea (adult) (pediatric): Secondary | ICD-10-CM

## 2013-06-18 NOTE — Progress Notes (Signed)
Patient Demographics  Adam Dennis, is a 53 y.o. male  [id number]  [id number]  DOB - [birth year]  CC:  Chief Complaint  Patient presents with  . Establish Care       HPI: Adam Dennis is a 53 y.o. male here today to establish medical care. She is history of hypertension hyperlipidemia , CHF, obstructive sleep apnea, has been following up with his cardiologist and pulmonologist, denies any orthopnea PND or leg swelling has been taking his medications. Patient has No headache, No chest pain, No abdominal pain - No Nausea, No new weakness tingling or numbness, No Cough - SOB.  No Known Allergies Past Medical History  Diagnosis Date  . Hypertrophy of tonsils alone   . Chronic diastolic heart failure   . Obesity, unspecified   . Obstructive sleep apnea (adult) (pediatric)   . CHF (congestive heart failure)    Current Outpatient Prescriptions on File Prior to Visit  Medication Sig Dispense Refill  . acetaminophen (TYLENOL) 325 MG tablet Take 325 mg by mouth every morning.       Marland Kitchen amLODipine (NORVASC) 10 MG tablet Take 1 tablet (10 mg total) by mouth daily.  30 tablet  0  . aspirin 325 MG tablet Take 1 tablet (325 mg total) by mouth daily.  30 tablet  0  . atenolol (TENORMIN) 50 MG tablet Take 50 mg by mouth daily.      Marland Kitchen atorvastatin (LIPITOR) 10 MG tablet Take 1 tablet (10 mg total) by mouth daily.  30 tablet  0  . furosemide (LASIX) 20 MG tablet Take 20 mg by mouth daily.      Marland Kitchen lisinopril (PRINIVIL,ZESTRIL) 40 MG tablet Take 1 tablet (40 mg total) by mouth daily.  30 tablet  0  . nitroGLYCERIN (NITROSTAT) 0.4 MG SL tablet Place 1 tablet (0.4 mg total) under the tongue every 5 (five) minutes x 3 doses as needed for chest pain.  25 tablet  1  . potassium chloride SA (K-DUR,KLOR-CON) 20 MEQ tablet Take 1 tablet (20 mEq total) by mouth daily.  30 tablet  1   No current facility-administered medications on file prior to visit.   Family History  Problem Relation Age of Onset   . Diabetes Brother   . Hypertension Mother   . Diabetes Sister   . Cancer Maternal Aunt    History   Social History  . Marital Status: Single    Spouse Name: N/A    Number of Children: 0  . Years of Education: N/A   Occupational History  . works as a Garment/textile technologist History Main Topics  . Smoking status: Never Smoker   . Smokeless tobacco: Never Used  . Alcohol Use: No  . Drug Use: Not on file  . Sexual Activity: Not on file   Other Topics Concern  . Not on file   Social History Narrative  . No narrative on file    Review of Systems: Constitutional: Negative for fever, chills, diaphoresis, activity change, appetite change and fatigue. HENT: Negative for ear pain, nosebleeds, congestion, facial swelling, rhinorrhea, neck pain, neck stiffness and ear discharge.  Eyes: Negative for pain, discharge, redness, itching and visual disturbance. Respiratory: Negative for cough, choking, chest tightness, shortness of breath, wheezing and stridor.  Cardiovascular: Negative for chest pain, palpitations and leg swelling. Gastrointestinal: Negative for abdominal distention. Genitourinary: Negative for dysuria, urgency, frequency, hematuria, flank pain, decreased urine volume, difficulty urinating and dyspareunia.  Musculoskeletal: Negative for back pain, joint swelling,  arthralgia and gait problem. Neurological: Negative for dizziness, tremors, seizures, syncope, facial asymmetry, speech difficulty, weakness, light-headedness, numbness and headaches.  Hematological: Negative for adenopathy. Does not bruise/bleed easily. Psychiatric/Behavioral: Negative for hallucinations, behavioral problems, confusion, dysphoric mood, decreased concentration and agitation.    Objective:   Filed Vitals:   06/18/13 1400  BP: 117/77  Pulse: 63  Temp: 98.2 F (36.8 C)  Resp: 16    Physical Exam: Constitutional: Obese male sitting comfortably not in acute distress HENT: Normocephalic,  atraumatic, External right and left ear normal. Oropharynx is clear and moist.  Eyes: Conjunctivae and EOM are normal. PERRLA, no scleral icterus. Neck: Normal ROM. Neck supple. No JVD. No tracheal deviation. No thyromegaly. CVS: RRR, S1/S2 +, no murmurs, no gallops, no carotid bruit.  Pulmonary: Effort and breath sounds normal, no stridor, rhonchi, wheezes, rales.  Abdominal: Soft. BS +, no distension, tenderness, rebound or guarding.  Musculoskeletal: Normal range of motion. No edema and no tenderness.  Skin: Skin is warm and dry. No rash noted. Not diaphoretic. No erythema. No pallor.  Lab Results  Component Value Date   WBC 5.2 12/13/2012   HGB 13.2 12/13/2012   HCT 46.3 12/13/2012   MCV 84.0 12/13/2012   PLT 280 12/13/2012   Lab Results  Component Value Date   CREATININE 1.02 12/17/2012   BUN 13 12/17/2012   NA 137 12/17/2012   K 4.7 12/17/2012   CL 101 12/17/2012   CO2 32 12/17/2012    Lab Results  Component Value Date   HGBA1C 5.9 07/03/2009   Lipid Panel     Component Value Date/Time   CHOL 102 12/12/2012 0754   TRIG 83 12/12/2012 0754   HDL 41 12/12/2012 0754   CHOLHDL 2.5 12/12/2012 0754   VLDL 17 12/12/2012 0754   LDLCALC 44 12/12/2012 0754       Assessment and plan:   1. Essential hypertension, benign  Continue with atenolol lisinopril, Lasix, Norvasc also advise for low salt diet  2. HYPERLIPIDEMIA  - Lipid panel; Future  3. OBSTRUCTIVE SLEEP APNEA On CPAP following up with pulmonology.  4. CHF (congestive heart failure) Continue with aspirin statin Lasix ACE inhibitor and following with cardiology  5. EXOGENOUS OBESITY Diet and exercise  6. Screening  - Vit D  25 hydroxy (rtn osteoporosis monitoring); Future - CBC with Differential; Future - COMPLETE METABOLIC PANEL WITH GFR; Future - TSH; Future  Updated with Pneumovax and  flu shot  Return in about 6 weeks (around 07/30/2013).  Lorayne Marek, MD

## 2013-06-18 NOTE — Progress Notes (Signed)
Patient here to establish care Takes medications for HTN and high cholesterol

## 2013-07-26 ENCOUNTER — Other Ambulatory Visit: Payer: BC Managed Care – PPO

## 2013-07-27 ENCOUNTER — Ambulatory Visit: Payer: No Typology Code available for payment source | Attending: Internal Medicine

## 2013-07-27 DIAGNOSIS — E785 Hyperlipidemia, unspecified: Secondary | ICD-10-CM

## 2013-07-27 DIAGNOSIS — Z139 Encounter for screening, unspecified: Secondary | ICD-10-CM

## 2013-07-27 LAB — CBC WITH DIFFERENTIAL/PLATELET
BASOS ABS: 0 10*3/uL (ref 0.0–0.1)
BASOS PCT: 0 % (ref 0–1)
Eosinophils Absolute: 0 10*3/uL (ref 0.0–0.7)
Eosinophils Relative: 1 % (ref 0–5)
HCT: 44.2 % (ref 39.0–52.0)
Hemoglobin: 14.5 g/dL (ref 13.0–17.0)
Lymphocytes Relative: 37 % (ref 12–46)
Lymphs Abs: 1.8 10*3/uL (ref 0.7–4.0)
MCH: 28.6 pg (ref 26.0–34.0)
MCHC: 32.8 g/dL (ref 30.0–36.0)
MCV: 87.2 fL (ref 78.0–100.0)
Monocytes Absolute: 0.6 10*3/uL (ref 0.1–1.0)
Monocytes Relative: 12 % (ref 3–12)
NEUTROS ABS: 2.5 10*3/uL (ref 1.7–7.7)
NEUTROS PCT: 50 % (ref 43–77)
PLATELETS: 228 10*3/uL (ref 150–400)
RBC: 5.07 MIL/uL (ref 4.22–5.81)
RDW: 15.3 % (ref 11.5–15.5)
WBC: 4.9 10*3/uL (ref 4.0–10.5)

## 2013-07-27 LAB — COMPLETE METABOLIC PANEL WITH GFR
ALBUMIN: 3.7 g/dL (ref 3.5–5.2)
ALK PHOS: 37 U/L — AB (ref 39–117)
ALT: 19 U/L (ref 0–53)
AST: 22 U/L (ref 0–37)
BUN: 17 mg/dL (ref 6–23)
CO2: 32 mEq/L (ref 19–32)
Calcium: 8.9 mg/dL (ref 8.4–10.5)
Chloride: 102 mEq/L (ref 96–112)
Creat: 1.23 mg/dL (ref 0.50–1.35)
GFR, Est African American: 78 mL/min
GFR, Est Non African American: 68 mL/min
Glucose, Bld: 76 mg/dL (ref 70–99)
POTASSIUM: 4.8 meq/L (ref 3.5–5.3)
SODIUM: 139 meq/L (ref 135–145)
TOTAL PROTEIN: 7.4 g/dL (ref 6.0–8.3)
Total Bilirubin: 0.6 mg/dL (ref 0.2–1.2)

## 2013-07-27 LAB — LIPID PANEL
CHOL/HDL RATIO: 2.5 ratio
Cholesterol: 131 mg/dL (ref 0–200)
HDL: 53 mg/dL (ref >39)
LDL CALC: 68 mg/dL (ref 0–99)
Triglycerides: 51 mg/dL (ref <150)
VLDL: 10 mg/dL (ref 0–40)

## 2013-07-28 ENCOUNTER — Telehealth: Payer: Self-pay

## 2013-07-28 LAB — TSH: TSH: 1.735 u[IU]/mL (ref 0.350–4.500)

## 2013-07-28 LAB — VITAMIN D 25 HYDROXY (VIT D DEFICIENCY, FRACTURES): VIT D 25 HYDROXY: 17 ng/mL — AB (ref 30–89)

## 2013-07-28 NOTE — Telephone Encounter (Signed)
Message copied by Dorothe Pea on Wed Jul 28, 2013  2:27 PM ------      Message from: Lorayne Marek      Created: Wed Jul 28, 2013  9:52 AM       Blood work reviewed, noticed low vitamin D, call patient advise to start ergocalciferol 50,000 units once a week for the duration of  12 weeks.       ------

## 2013-07-28 NOTE — Telephone Encounter (Signed)
Patient not available Left message on voice mail to return our call 

## 2013-07-30 ENCOUNTER — Ambulatory Visit: Payer: BC Managed Care – PPO | Admitting: Internal Medicine

## 2013-07-30 ENCOUNTER — Other Ambulatory Visit: Payer: Self-pay | Admitting: *Deleted

## 2013-07-30 ENCOUNTER — Telehealth: Payer: Self-pay | Admitting: *Deleted

## 2013-07-30 DIAGNOSIS — E559 Vitamin D deficiency, unspecified: Secondary | ICD-10-CM

## 2013-07-30 MED ORDER — VITAMIN D (ERGOCALCIFEROL) 1.25 MG (50000 UNIT) PO CAPS: 50000.0000 [IU] | ORAL_CAPSULE | ORAL | Status: DC

## 2013-07-30 NOTE — Telephone Encounter (Signed)
Pt called and I informed him of his lab results. I prescribed the vitamin D supplement.

## 2013-08-12 ENCOUNTER — Ambulatory Visit: Payer: No Typology Code available for payment source

## 2013-08-16 ENCOUNTER — Encounter: Payer: Self-pay | Admitting: Internal Medicine

## 2013-08-16 ENCOUNTER — Ambulatory Visit: Payer: No Typology Code available for payment source

## 2013-08-16 ENCOUNTER — Ambulatory Visit: Payer: No Typology Code available for payment source | Attending: Internal Medicine | Admitting: Internal Medicine

## 2013-08-16 VITALS — BP 130/80 | HR 90 | Temp 97.6°F | Resp 16 | Wt 330.8 lb

## 2013-08-16 DIAGNOSIS — I10 Essential (primary) hypertension: Secondary | ICD-10-CM

## 2013-08-16 DIAGNOSIS — I509 Heart failure, unspecified: Secondary | ICD-10-CM

## 2013-08-16 DIAGNOSIS — I5032 Chronic diastolic (congestive) heart failure: Secondary | ICD-10-CM | POA: Insufficient documentation

## 2013-08-16 DIAGNOSIS — Z79899 Other long term (current) drug therapy: Secondary | ICD-10-CM | POA: Insufficient documentation

## 2013-08-16 DIAGNOSIS — Z1211 Encounter for screening for malignant neoplasm of colon: Secondary | ICD-10-CM

## 2013-08-16 DIAGNOSIS — G4733 Obstructive sleep apnea (adult) (pediatric): Secondary | ICD-10-CM | POA: Insufficient documentation

## 2013-08-16 DIAGNOSIS — E785 Hyperlipidemia, unspecified: Secondary | ICD-10-CM

## 2013-08-16 DIAGNOSIS — E669 Obesity, unspecified: Secondary | ICD-10-CM | POA: Insufficient documentation

## 2013-08-16 DIAGNOSIS — E559 Vitamin D deficiency, unspecified: Secondary | ICD-10-CM

## 2013-08-16 NOTE — Progress Notes (Signed)
MRN: [id number] Name: Adam Dennis  Sex: male Age: 53 y.o. DOB: 17-Sep-1960  Allergies: Review of patient's allergies indicates no known allergies.  Chief Complaint  Patient presents with  . Follow-up    HPI: Patient is 53 y.o. male who comes today for followup history of hypertension hyperlipidemia, CHF following up with the cardiologist denies any chest pain or shortness of breath, recently had a blood work done which was reviewed with the patient, noticed vitamin D deficiency, patient has already been started on supplement, initially his blood pressure was elevated, repeat manual blood pressure is 130/80.  Past Medical History  Diagnosis Date  . Hypertrophy of tonsils alone   . Chronic diastolic heart failure   . Obesity, unspecified   . Obstructive sleep apnea (adult) (pediatric)   . CHF (congestive heart failure)   . Hypertension     Past Surgical History  Procedure Laterality Date  . Umbilical hernia repair        Medication List       This list is accurate as of: 08/16/13 10:09 AM.  Always use your most recent med list.               acetaminophen 325 MG tablet  Commonly known as:  TYLENOL  Take 325 mg by mouth every morning.     amLODipine 10 MG tablet  Commonly known as:  NORVASC  Take 1 tablet (10 mg total) by mouth daily.     aspirin 325 MG tablet  Take 1 tablet (325 mg total) by mouth daily.     atenolol 50 MG tablet  Commonly known as:  TENORMIN  Take 50 mg by mouth daily.     atorvastatin 10 MG tablet  Commonly known as:  LIPITOR  Take 1 tablet (10 mg total) by mouth daily.     furosemide 20 MG tablet  Commonly known as:  LASIX  Take 20 mg by mouth daily.     lisinopril 40 MG tablet  Commonly known as:  PRINIVIL,ZESTRIL  Take 1 tablet (40 mg total) by mouth daily.     nitroGLYCERIN 0.4 MG SL tablet  Commonly known as:  NITROSTAT  Place 1 tablet (0.4 mg total) under the tongue every 5 (five) minutes x 3 doses as needed for chest  pain.     potassium chloride SA 20 MEQ tablet  Commonly known as:  K-DUR,KLOR-CON  Take 1 tablet (20 mEq total) by mouth daily.     Vitamin D (Ergocalciferol) 50000 UNITS Caps capsule  Commonly known as:  DRISDOL  Take 1 capsule (50,000 Units total) by mouth every 7 (seven) days.        No orders of the defined types were placed in this encounter.    Immunization History  Administered Date(s) Administered  . Influenza Whole 06/02/2009  . Influenza,inj,Quad PF,36+ Mos 02/15/2013  . Pneumococcal Polysaccharide-23 12/13/2012    Family History  Problem Relation Age of Onset  . Diabetes Brother   . Hypertension Mother   . Diabetes Sister   . Cancer Maternal Aunt     History  Substance Use Topics  . Smoking status: Never Smoker   . Smokeless tobacco: Never Used  . Alcohol Use: No    Review of Systems   As noted in HPI  Filed Vitals:   08/16/13 1007  BP: 130/80  Pulse:   Temp:   Resp:     Physical Exam  Physical Exam  Constitutional:  Obese male sitting comfortably  not in acute distress  Eyes: EOM are normal. Pupils are equal, round, and reactive to light.  Cardiovascular: Normal rate and regular rhythm.   Pulmonary/Chest: Breath sounds normal. No respiratory distress. He has no wheezes. He has no rales.  Musculoskeletal: He exhibits no edema.    CBC    Component Value Date/Time   WBC 4.9 07/27/2013 0909   RBC 5.07 07/27/2013 0909   HGB 14.5 07/27/2013 0909   HCT 44.2 07/27/2013 0909   PLT 228 07/27/2013 0909   MCV 87.2 07/27/2013 0909   LYMPHSABS 1.8 07/27/2013 0909   MONOABS 0.6 07/27/2013 0909   EOSABS 0.0 07/27/2013 0909   BASOSABS 0.0 07/27/2013 0909    CMP     Component Value Date/Time   NA 139 07/27/2013 0909   K 4.8 07/27/2013 0909   CL 102 07/27/2013 0909   CO2 32 07/27/2013 0909   GLUCOSE 76 07/27/2013 0909   BUN 17 07/27/2013 0909   CREATININE 1.23 07/27/2013 0909   CREATININE 1.02 12/17/2012 0510   CALCIUM 8.9 07/27/2013 0909   PROT 7.4 07/27/2013 0909    ALBUMIN 3.7 07/27/2013 0909   AST 22 07/27/2013 0909   ALT 19 07/27/2013 0909   ALKPHOS 37* 07/27/2013 0909   BILITOT 0.6 07/27/2013 0909   GFRNONAA 84* 12/17/2012 0510   GFRAA >90 12/17/2012 0510    Lab Results  Component Value Date/Time   CHOL 131 07/27/2013  9:09 AM    No components found with this basename: hga1c    Lab Results  Component Value Date/Time   AST 22 07/27/2013  9:09 AM    Assessment and Plan  Unspecified vitamin D deficiency Continue with vitamin D supplement  Essential hypertension, benign Controlled continue with amlodipine, lisinopril  Other and unspecified hyperlipidemia  Patient is on Lipitor LDL at goal.  CHF (congestive heart failure) Following up with the cardiologist on aspirin statin ACE inhibitor and Lasix.  Special screening for malignant neoplasms, colon - Plan: Ambulatory referral to Gastroenterology   Return in about 4 months (around 12/16/2013).  Lorayne Marek, MD

## 2013-08-16 NOTE — Patient Instructions (Signed)
DASH Diet  The DASH diet stands for "Dietary Approaches to Stop Hypertension." It is a healthy eating plan that has been shown to reduce high blood pressure (hypertension) in as little as 14 days, while also possibly providing other significant health benefits. These other health benefits include reducing the risk of breast cancer after menopause and reducing the risk of type 2 diabetes, heart disease, colon cancer, and stroke. Health benefits also include weight loss and slowing kidney failure in patients with chronic kidney disease.   DIET GUIDELINES  · Limit salt (sodium). Your diet should contain less than 1500 mg of sodium daily.  · Limit refined or processed carbohydrates. Your diet should include mostly whole grains. Desserts and added sugars should be used sparingly.  · Include small amounts of heart-healthy fats. These types of fats include nuts, oils, and tub margarine. Limit saturated and trans fats. These fats have been shown to be harmful in the body.  CHOOSING FOODS   The following food groups are based on a 2000 calorie diet. See your Registered Dietitian for individual calorie needs.  Grains and Grain Products (6 to 8 servings daily)  · Eat More Often: Whole-wheat bread, brown rice, whole-grain or wheat pasta, quinoa, popcorn without added fat or salt (air popped).  · Eat Less Often: White bread, white pasta, white rice, cornbread.  Vegetables (4 to 5 servings daily)  · Eat More Often: Fresh, frozen, and canned vegetables. Vegetables may be raw, steamed, roasted, or grilled with a minimal amount of fat.  · Eat Less Often/Avoid: Creamed or fried vegetables. Vegetables in a cheese sauce.  Fruit (4 to 5 servings daily)  · Eat More Often: All fresh, canned (in natural juice), or frozen fruits. Dried fruits without added sugar. One hundred percent fruit juice (½ cup [237 mL] daily).  · Eat Less Often: Dried fruits with added sugar. Canned fruit in light or heavy syrup.  Lean Meats, Fish, and Poultry (2  servings or less daily. One serving is 3 to 4 oz [85-114 g]).  · Eat More Often: Ninety percent or leaner ground beef, tenderloin, sirloin. Round cuts of beef, chicken breast, turkey breast. All fish. Grill, bake, or broil your meat. Nothing should be fried.  · Eat Less Often/Avoid: Fatty cuts of meat, turkey, or chicken leg, thigh, or wing. Fried cuts of meat or fish.  Dairy (2 to 3 servings)  · Eat More Often: Low-fat or fat-free milk, low-fat plain or light yogurt, reduced-fat or part-skim cheese.  · Eat Less Often/Avoid: Milk (whole, 2%). Whole milk yogurt. Full-fat cheeses.  Nuts, Seeds, and Legumes (4 to 5 servings per week)  · Eat More Often: All without added salt.  · Eat Less Often/Avoid: Salted nuts and seeds, canned beans with added salt.  Fats and Sweets (limited)  · Eat More Often: Vegetable oils, tub margarines without trans fats, sugar-free gelatin. Mayonnaise and salad dressings.  · Eat Less Often/Avoid: Coconut oils, palm oils, butter, stick margarine, cream, half and half, cookies, candy, pie.  FOR MORE INFORMATION  The Dash Diet Eating Plan: www.dashdiet.org  Document Released: 05/02/2011 Document Revised: 08/05/2011 Document Reviewed: 05/02/2011  ExitCare® Patient Information ©2014 [employer].

## 2013-08-16 NOTE — Progress Notes (Signed)
Patient here for follow up History of hypertension and CHF

## 2013-09-16 ENCOUNTER — Encounter: Payer: Self-pay | Admitting: Internal Medicine

## 2013-11-12 ENCOUNTER — Emergency Department (HOSPITAL_COMMUNITY): Payer: No Typology Code available for payment source

## 2013-11-12 ENCOUNTER — Encounter (HOSPITAL_COMMUNITY): Payer: Self-pay | Admitting: Emergency Medicine

## 2013-11-12 ENCOUNTER — Emergency Department (HOSPITAL_COMMUNITY)
Admission: EM | Admit: 2013-11-12 | Discharge: 2013-11-12 | Disposition: A | Discharge status: Home / Self Care | Payer: No Typology Code available for payment source | Attending: Emergency Medicine | Admitting: Emergency Medicine

## 2013-11-12 DIAGNOSIS — M79609 Pain in unspecified limb: Secondary | ICD-10-CM | POA: Insufficient documentation

## 2013-11-12 DIAGNOSIS — I10 Essential (primary) hypertension: Secondary | ICD-10-CM | POA: Insufficient documentation

## 2013-11-12 DIAGNOSIS — Z791 Long term (current) use of non-steroidal anti-inflammatories (NSAID): Secondary | ICD-10-CM | POA: Insufficient documentation

## 2013-11-12 DIAGNOSIS — E669 Obesity, unspecified: Secondary | ICD-10-CM | POA: Insufficient documentation

## 2013-11-12 DIAGNOSIS — Z7982 Long term (current) use of aspirin: Secondary | ICD-10-CM | POA: Insufficient documentation

## 2013-11-12 DIAGNOSIS — R0789 Other chest pain: Secondary | ICD-10-CM

## 2013-11-12 DIAGNOSIS — I509 Heart failure, unspecified: Secondary | ICD-10-CM | POA: Insufficient documentation

## 2013-11-12 DIAGNOSIS — R071 Chest pain on breathing: Secondary | ICD-10-CM | POA: Insufficient documentation

## 2013-11-12 DIAGNOSIS — Z79899 Other long term (current) drug therapy: Secondary | ICD-10-CM | POA: Insufficient documentation

## 2013-11-12 LAB — CBC
HEMATOCRIT: 43.2 % (ref 39.0–52.0)
HEMOGLOBIN: 14.3 g/dL (ref 13.0–17.0)
MCH: 28.9 pg (ref 26.0–34.0)
MCHC: 33.1 g/dL (ref 30.0–36.0)
MCV: 87.4 fL (ref 78.0–100.0)
Platelets: 217 10*3/uL (ref 150–400)
RBC: 4.94 MIL/uL (ref 4.22–5.81)
RDW: 14.8 % (ref 11.5–15.5)
WBC: 4.9 10*3/uL (ref 4.0–10.5)

## 2013-11-12 LAB — BASIC METABOLIC PANEL
BUN: 19 mg/dL (ref 6–23)
CHLORIDE: 99 meq/L (ref 96–112)
CO2: 28 meq/L (ref 19–32)
Calcium: 9 mg/dL (ref 8.4–10.5)
Creatinine, Ser: 1.25 mg/dL (ref 0.50–1.35)
GFR calc Af Amer: 75 mL/min — ABNORMAL LOW (ref >90)
GFR, EST NON AFRICAN AMERICAN: 65 mL/min — AB (ref >90)
GLUCOSE: 96 mg/dL (ref 70–99)
POTASSIUM: 5 meq/L (ref 3.7–5.3)
Sodium: 138 mEq/L (ref 137–147)

## 2013-11-12 LAB — PRO B NATRIURETIC PEPTIDE: PRO B NATRI PEPTIDE: 227.1 pg/mL — AB (ref 0–125)

## 2013-11-12 MED ORDER — NAPROXEN 500 MG PO TABS: 500.0000 mg | ORAL_TABLET | Freq: Two times a day (BID) | ORAL | Status: DC

## 2013-11-12 MED ORDER — KETOROLAC TROMETHAMINE 30 MG/ML IJ SOLN
30.0000 mg | Freq: Once | INTRAMUSCULAR | Status: AC
  Administered 2013-11-12: 30 mg via INTRAVENOUS
  Filled 2013-11-12: qty 1

## 2013-11-12 NOTE — ED Notes (Signed)
Communication with mini lab concerning I-Stat Troponin.

## 2013-11-12 NOTE — ED Provider Notes (Signed)
CSN: [id number]     Arrival date & time 11/12/13  0919 History   First MD Initiated Contact with Patient 11/12/13 [phone]     Chief Complaint  Patient presents with  . Chest Pain  . Arm Pain     (Consider location/radiation/quality/duration/timing/severity/associated sxs/prior Treatment) HPI Comments: Adam Dennis is a 53 y.o. Male with history of chf, htn and sleep apnea presenting with a 3 day history of left sided chest pain which is worsened with bending and moving and when he lies on his left side, but also has been constant at rest since yesterday.  He denies nausea, vomiting, shortness of breath, diaphoresis.  He denies worsened symptoms with exertion. He also denies any pain or swelling in his lower extremities.  He has taken no medicines for this prior to arrival.       The history is provided by the patient.    Past Medical History  Diagnosis Date  . Hypertrophy of tonsils alone   . Chronic diastolic heart failure   . Obesity, unspecified   . Obstructive sleep apnea (adult) (pediatric)   . CHF (congestive heart failure)   . Hypertension    Past Surgical History  Procedure Laterality Date  . Umbilical hernia repair     Family History  Problem Relation Age of Onset  . Diabetes Brother   . Hypertension Mother   . Diabetes Sister   . Cancer Maternal Aunt    History  Substance Use Topics  . Smoking status: Never Smoker   . Smokeless tobacco: Never Used  . Alcohol Use: No    Review of Systems  Constitutional: Negative for fever.  HENT: Negative for congestion and sore throat.   Eyes: Negative.   Respiratory: Negative for chest tightness and shortness of breath.   Cardiovascular: Positive for chest pain. Negative for palpitations and leg swelling.  Gastrointestinal: Negative for nausea and abdominal pain.  Genitourinary: Negative.   Musculoskeletal: Negative for arthralgias, joint swelling and neck pain.  Skin: Negative.  Negative for rash and wound.   Neurological: Negative for dizziness, weakness, light-headedness, numbness and headaches.  Psychiatric/Behavioral: Negative.       Allergies  Review of patient's allergies indicates no known allergies.  Home Medications   Prior to Admission medications   Medication Sig Start Date End Date Taking? Authorizing Provider  amLODipine (NORVASC) 10 MG tablet Take 1 tablet (10 mg total) by mouth daily. 10/17/12  Yes Julianne Rice, MD  aspirin 325 MG tablet Take 1 tablet (325 mg total) by mouth daily. 10/17/12  Yes Julianne Rice, MD  atenolol (TENORMIN) 50 MG tablet Take 50 mg by mouth daily. 10/17/12  Yes Julianne Rice, MD  atorvastatin (LIPITOR) 10 MG tablet Take 1 tablet (10 mg total) by mouth daily. 10/17/12  Yes Julianne Rice, MD  furosemide (LASIX) 20 MG tablet Take 20 mg by mouth daily.   Yes Historical Provider, MD  lisinopril (PRINIVIL,ZESTRIL) 40 MG tablet Take 1 tablet (40 mg total) by mouth daily. 10/17/12  Yes Julianne Rice, MD  nitroGLYCERIN (NITROSTAT) 0.4 MG SL tablet Place 1 tablet (0.4 mg total) under the tongue every 5 (five) minutes x 3 doses as needed for chest pain. 12/17/12  Yes Birdie Riddle, MD  potassium chloride SA (K-DUR,KLOR-CON) 20 MEQ tablet Take 1 tablet (20 mEq total) by mouth daily. 12/17/12  Yes Birdie Riddle, MD  naproxen (NAPROSYN) 500 MG tablet Take 1 tablet (500 mg total) by mouth 2 (two) times daily. 11/12/13  Evalee Jefferson, PA-C   BP 133/89  Pulse 53  Temp(Src) 97.7 F (36.5 C) (Oral)  Resp 19  Ht 5\' 9"  (1.753 m)  Wt 325 lb (147.419 kg)  BMI 47.97 kg/m2  SpO2 97% Physical Exam  Nursing note and vitals reviewed. Constitutional: He appears well-developed and well-nourished.  HENT:  Head: Normocephalic and atraumatic.  Eyes: Conjunctivae are normal.  Neck: Normal range of motion.  Cardiovascular: Normal rate, regular rhythm, normal heart sounds and intact distal pulses.   Pulmonary/Chest: Effort normal and breath sounds normal. No respiratory  distress. He has no wheezes. He exhibits tenderness.  ttp left lateral chest wall. No rash, contusion, nodules.    Abdominal: Soft. Bowel sounds are normal. There is no tenderness.  Musculoskeletal: Normal range of motion.  Neurological: He is alert.  Skin: Skin is warm and dry.  Psychiatric: He has a normal mood and affect.    ED Course  Procedures (including critical care time) Labs Review Labs Reviewed  BASIC METABOLIC PANEL - Abnormal; Notable for the following:    GFR calc non Af Amer 65 (*)    GFR calc Af Amer 75 (*)    All other components within normal limits  PRO B NATRIURETIC PEPTIDE - Abnormal; Notable for the following:    Pro B Natriuretic peptide (BNP) 227.1 (*)    All other components within normal limits  CBC  I-STAT TROPOININ, ED    Imaging Review Dg Chest 2 View  11/12/2013   CLINICAL DATA:  Chest pain.  EXAM: CHEST  2 VIEW  COMPARISON:  12/11/2012  FINDINGS: Elevation of the left hemidiaphragm. No confluent airspace opacities. Heart is upper limits normal in size. No effusions. No acute bony abnormality.  IMPRESSION: No active disease.  Stable elevation of the left hemidiaphragm.   Electronically Signed   By: Rolm Baptise M.D.   On: 11/12/2013 09:44     EKG Interpretation   Date/Time:  Friday November 12 2013 09:22:29 EDT Ventricular Rate:  61 PR Interval:  170 QRS Duration: 100 QT Interval:  398 QTC Calculation: 400 R Axis:   26 Text Interpretation:  Normal sinus rhythm Normal ECG Confirmed by Jeneen Rinks   MD, [place] ([id number]) on 11/12/2013 9:28:10 AM      MDM   Final diagnoses:  Chest wall pain    Patients labs and/or radiological studies were viewed and considered during the medical decision making and disposition process. Labs discussed with patient.  Sx most consistent with chest wall pain,  Ekg, trop negative.  Advised pt f/u with pcp if sx persist or are not improved over the next several days.  Naproxen,  Heat tx recommended.  Discussed with Dr Jeneen Rinks  prior to dc home.    Evalee Jefferson, PA-C 11/12/13 1208

## 2013-11-12 NOTE — ED Notes (Signed)
PA at bedside.

## 2013-11-12 NOTE — Discharge Instructions (Signed)
Chest Wall Pain °Chest wall pain is pain felt in or around the chest bones and muscles. It may take up to 6 weeks to get better. It may take longer if you are active. Chest wall pain can happen on its own. Other times, things like germs, injury, coughing, or exercise can cause the pain. °HOME CARE  °· Avoid activities that make you tired or cause pain. Try not to use your chest, belly (abdominal), or side muscles. Do not use heavy weights. °· Put ice on the sore area. °¨ Put ice in a plastic bag. °¨ Place a towel between your skin and the bag. °¨ Leave the ice on for 15-20 minutes for the first 2 days. °· Only take medicine as told by your doctor. °GET HELP RIGHT AWAY IF:  °· You have more pain or are very uncomfortable. °· You have a fever. °· Your chest pain gets worse. °· You have new problems. °· You feel sick to your stomach (nauseous) or throw up (vomit). °· You start to sweat or feel lightheaded. °· You have a cough with mucus (phlegm). °· You cough up blood. °MAKE SURE YOU:  °· Understand these instructions. °· Will watch your condition. °· Will get help right away if you are not doing well or get worse. °Document Released: 10/30/2007 Document Revised: 08/05/2011 Document Reviewed: 01/07/2011 °ExitCare® Patient Information ©2015 [employer]. This information is not intended to replace advice given to you by your health care provider. Make sure you discuss any questions you have with your health care provider. ° °

## 2013-11-12 NOTE — ED Notes (Signed)
Per pt sts intermittent left sided chest pain radiating into left arm for 3 days. sts worse when bending over and moving,.

## 2013-11-15 LAB — I-STAT TROPONIN, ED: Troponin i, poc: 0.01 ng/mL (ref 0.00–0.08)

## 2013-11-17 NOTE — ED Provider Notes (Signed)
Medical screening examination/treatment/procedure(s) were conducted as a shared visit with non-physician practitioner(s) and myself.  I personally evaluated the patient during the encounter.   EKG Interpretation   Date/Time:  Friday November 12 2013 09:22:29 EDT Ventricular Rate:  61 PR Interval:  170 QRS Duration: 100 QT Interval:  398 QTC Calculation: 400 R Axis:   26 Text Interpretation:  Normal sinus rhythm Normal ECG Confirmed by Jeneen Rinks   MD, [place] ([id number]) on 11/12/2013 9:28:10 AM      Patient seen and evaluated. Patient complains of left-sided chest pain and left rib and shoulder pain. Exacerbated with movement or laying on that side. Pain is reproducible here with movement or palpation. Symptoms for 3 days. EKG year showed no acute changes. Troponin normal. I agree with evaluation by Evalee Jefferson PA. Agree with disposition home in treatment for chest wall pain. Patient given appropriate return  Tanna Furry, MD 11/17/13 [id number]

## 2014-09-28 ENCOUNTER — Ambulatory Visit: Payer: No Typology Code available for payment source | Attending: Internal Medicine

## 2014-10-07 ENCOUNTER — Inpatient Hospital Stay (HOSPITAL_COMMUNITY)
Admission: EM | Admit: 2014-10-07 | Discharge: 2014-10-12 | DRG: 291 | Disposition: A | Discharge status: Home / Self Care | Payer: Medicaid Other | Attending: Oncology | Admitting: Oncology

## 2014-10-07 ENCOUNTER — Encounter (HOSPITAL_COMMUNITY): Payer: Self-pay | Admitting: Family Medicine

## 2014-10-07 ENCOUNTER — Emergency Department (HOSPITAL_COMMUNITY): Payer: Medicaid Other

## 2014-10-07 DIAGNOSIS — E873 Alkalosis: Secondary | ICD-10-CM | POA: Diagnosis not present

## 2014-10-07 DIAGNOSIS — N179 Acute kidney failure, unspecified: Secondary | ICD-10-CM | POA: Diagnosis present

## 2014-10-07 DIAGNOSIS — Z6841 Body Mass Index (BMI) 40.0 and over, adult: Secondary | ICD-10-CM

## 2014-10-07 DIAGNOSIS — Z7982 Long term (current) use of aspirin: Secondary | ICD-10-CM | POA: Diagnosis not present

## 2014-10-07 DIAGNOSIS — E8809 Other disorders of plasma-protein metabolism, not elsewhere classified: Secondary | ICD-10-CM | POA: Diagnosis present

## 2014-10-07 DIAGNOSIS — Z9114 Patient's other noncompliance with medication regimen: Secondary | ICD-10-CM | POA: Diagnosis present

## 2014-10-07 DIAGNOSIS — I130 Hypertensive heart and chronic kidney disease with heart failure and stage 1 through stage 4 chronic kidney disease, or unspecified chronic kidney disease: Principal | ICD-10-CM | POA: Diagnosis present

## 2014-10-07 DIAGNOSIS — I5043 Acute on chronic combined systolic (congestive) and diastolic (congestive) heart failure: Secondary | ICD-10-CM | POA: Diagnosis present

## 2014-10-07 DIAGNOSIS — G4733 Obstructive sleep apnea (adult) (pediatric): Secondary | ICD-10-CM | POA: Diagnosis present

## 2014-10-07 DIAGNOSIS — I10 Essential (primary) hypertension: Secondary | ICD-10-CM | POA: Diagnosis present

## 2014-10-07 DIAGNOSIS — R0602 Shortness of breath: Secondary | ICD-10-CM

## 2014-10-07 DIAGNOSIS — E785 Hyperlipidemia, unspecified: Secondary | ICD-10-CM | POA: Diagnosis present

## 2014-10-07 DIAGNOSIS — Z79899 Other long term (current) drug therapy: Secondary | ICD-10-CM | POA: Diagnosis not present

## 2014-10-07 DIAGNOSIS — T502X5A Adverse effect of carbonic-anhydrase inhibitors, benzothiadiazides and other diuretics, initial encounter: Secondary | ICD-10-CM | POA: Diagnosis not present

## 2014-10-07 DIAGNOSIS — N182 Chronic kidney disease, stage 2 (mild): Secondary | ICD-10-CM | POA: Diagnosis present

## 2014-10-07 DIAGNOSIS — I129 Hypertensive chronic kidney disease with stage 1 through stage 4 chronic kidney disease, or unspecified chronic kidney disease: Secondary | ICD-10-CM

## 2014-10-07 HISTORY — DX: Chronic combined systolic (congestive) and diastolic (congestive) heart failure: I50.42

## 2014-10-07 LAB — CBC WITH DIFFERENTIAL/PLATELET
BASOS PCT: 1 % (ref 0–1)
Basophils Absolute: 0 10*3/uL (ref 0.0–0.1)
EOS ABS: 0 10*3/uL (ref 0.0–0.7)
EOS PCT: 1 % (ref 0–5)
HEMATOCRIT: 47.4 % (ref 39.0–52.0)
HEMOGLOBIN: 14.4 g/dL (ref 13.0–17.0)
LYMPHS PCT: 27 % (ref 12–46)
Lymphs Abs: 1.5 10*3/uL (ref 0.7–4.0)
MCH: 25.5 pg — ABNORMAL LOW (ref 26.0–34.0)
MCHC: 30.4 g/dL (ref 30.0–36.0)
MCV: 83.9 fL (ref 78.0–100.0)
MONOS PCT: 18 % — AB (ref 3–12)
Monocytes Absolute: 1 10*3/uL (ref 0.1–1.0)
NEUTROS ABS: 3 10*3/uL (ref 1.7–7.7)
Neutrophils Relative %: 53 % (ref 43–77)
Platelets: 263 10*3/uL (ref 150–400)
RBC: 5.65 MIL/uL (ref 4.22–5.81)
RDW: 17 % — ABNORMAL HIGH (ref 11.5–15.5)
WBC: 5.6 10*3/uL (ref 4.0–10.5)

## 2014-10-07 LAB — LIPID PANEL
Cholesterol: 145 mg/dL (ref 0–200)
HDL: 52 mg/dL (ref >40)
LDL Cholesterol: 80 mg/dL (ref 0–99)
TRIGLYCERIDES: 66 mg/dL (ref <150)
Total CHOL/HDL Ratio: 2.8 RATIO
VLDL: 13 mg/dL (ref 0–40)

## 2014-10-07 LAB — COMPREHENSIVE METABOLIC PANEL
ALK PHOS: 44 U/L (ref 38–126)
ALT: 64 U/L — ABNORMAL HIGH (ref 17–63)
AST: 54 U/L — AB (ref 15–41)
Albumin: 2.6 g/dL — ABNORMAL LOW (ref 3.5–5.0)
Anion gap: 9 (ref 5–15)
BILIRUBIN TOTAL: 0.7 mg/dL (ref 0.3–1.2)
BUN: 24 mg/dL — ABNORMAL HIGH (ref 6–20)
CALCIUM: 8.4 mg/dL — AB (ref 8.9–10.3)
CO2: 29 mmol/L (ref 22–32)
CREATININE: 1.65 mg/dL — AB (ref 0.61–1.24)
Chloride: 104 mmol/L (ref 101–111)
GFR calc non Af Amer: 46 mL/min — ABNORMAL LOW (ref >60)
GFR, EST AFRICAN AMERICAN: 54 mL/min — AB (ref >60)
GLUCOSE: 101 mg/dL — AB (ref 65–99)
POTASSIUM: 4.7 mmol/L (ref 3.5–5.1)
Sodium: 142 mmol/L (ref 135–145)
Total Protein: 6.5 g/dL (ref 6.5–8.1)

## 2014-10-07 LAB — HEPATITIS PANEL, ACUTE
HCV AB: NEGATIVE
HEP B S AG: NEGATIVE
Hep A IgM: NONREACTIVE
Hep B C IgM: NONREACTIVE

## 2014-10-07 LAB — TROPONIN I

## 2014-10-07 LAB — BRAIN NATRIURETIC PEPTIDE: B Natriuretic Peptide: 574.2 pg/mL — ABNORMAL HIGH (ref 0.0–100.0)

## 2014-10-07 MED ORDER — ENOXAPARIN SODIUM 40 MG/0.4ML ~~LOC~~ SOLN
40.0000 mg | SUBCUTANEOUS | Status: DC
  Administered 2014-10-07: 40 mg via SUBCUTANEOUS
  Filled 2014-10-07 (×2): qty 0.4

## 2014-10-07 MED ORDER — ACETAMINOPHEN 325 MG PO TABS: 650.0000 mg | ORAL_TABLET | Freq: Four times a day (QID) | ORAL | Status: DC | PRN

## 2014-10-07 MED ORDER — SODIUM CHLORIDE 0.9 % IJ SOLN
3.0000 mL | Freq: Two times a day (BID) | INTRAMUSCULAR | Status: DC
  Administered 2014-10-07 – 2014-10-12 (×11): 3 mL via INTRAVENOUS

## 2014-10-07 MED ORDER — ACETAMINOPHEN 650 MG RE SUPP: 650.0000 mg | Freq: Four times a day (QID) | RECTAL | Status: DC | PRN

## 2014-10-07 MED ORDER — CARVEDILOL 6.25 MG PO TABS
6.2500 mg | ORAL_TABLET | Freq: Two times a day (BID) | ORAL | Status: DC
  Filled 2014-10-07 (×3): qty 1

## 2014-10-07 MED ORDER — FUROSEMIDE 10 MG/ML IJ SOLN
80.0000 mg | Freq: Three times a day (TID) | INTRAMUSCULAR | Status: AC
  Administered 2014-10-07 – 2014-10-09 (×5): 80 mg via INTRAVENOUS
  Filled 2014-10-07 (×5): qty 8

## 2014-10-07 MED ORDER — ASPIRIN EC 81 MG PO TBEC
81.0000 mg | DELAYED_RELEASE_TABLET | Freq: Every day | ORAL | Status: DC
  Administered 2014-10-07 – 2014-10-12 (×6): 81 mg via ORAL
  Filled 2014-10-07 (×6): qty 1

## 2014-10-07 MED ORDER — FUROSEMIDE 10 MG/ML IJ SOLN
80.0000 mg | Freq: Once | INTRAMUSCULAR | Status: AC
  Administered 2014-10-07: 80 mg via INTRAVENOUS
  Filled 2014-10-07: qty 8

## 2014-10-07 NOTE — H&P (Signed)
Date: 10/07/2014               Patient Name:  Adam Dennis MRN: [id number]  DOB: [birth year] Age / Sex: 54 y.o., male   PCP: Adam Marek, MD         Medical Service: Internal Medicine Teaching Service         Attending Physician: Dr. Lynnae Dennis    First Contact: Dr. Albin Dennis Pager: [phone]  Second Contact: Dr. Joni Dennis  Pager: [phone]       After Hours (After 5p/  First Contact Pager: [phone]  weekends / holidays): Second Contact Pager: [phone]   Chief Complaint: Shortness of breath  History of Present Illness: Adam Dennis is a 54yo man with PMHx of HTN, hyperlipidemia, combined systolic and diastolic HF (EF 123456, grade 1 diastolic dysfunction), and obesity who presents to the ED with shortness of breath. Patient reports gradually worsening SOB for the past 3-4 days. He states he can only walk about 15 steps before he becomes SOB. He notes he cannot lie flat without becoming short of breath and only sleeps 2 hours a night because he wakes up from SOB. He also reports leg swelling and his abdomen becoming softer over the past few days. He denies chest pain at rest, but notes a left-sided burning chest pain when walking. He reports difficulties getting his medications due to finances. He states he does not always have all of his medications at one time. He reports recently running out of Lasix last month, but states he has been taking it for the past month. He is also on another medication that starts with an "a" but is unsure of the name. He does not seem familiar with most of his medications. He works as a Training and development officer and notes increasing dyspnea at work as well. He denies abdominal pain, nausea, diaphoresis, cough, or wheezing.   Meds: No current facility-administered medications for this encounter.   Current Outpatient Prescriptions  Medication Sig Dispense Refill  . aspirin EC 81 MG tablet Take 81 mg by mouth daily.    Marland Kitchen atenolol (TENORMIN) 50 MG tablet Take 50 mg by mouth daily.      . furosemide (LASIX) 20 MG tablet Take 20 mg by mouth daily.    Marland Kitchen amLODipine (NORVASC) 10 MG tablet Take 1 tablet (10 mg total) by mouth daily. (Patient not taking: Reported on 10/07/2014) 30 tablet 0  . aspirin 325 MG tablet Take 1 tablet (325 mg total) by mouth daily. (Patient not taking: Reported on 10/07/2014) 30 tablet 0  . atorvastatin (LIPITOR) 10 MG tablet Take 1 tablet (10 mg total) by mouth daily. (Patient not taking: Reported on 10/07/2014) 30 tablet 0  . lisinopril (PRINIVIL,ZESTRIL) 40 MG tablet Take 1 tablet (40 mg total) by mouth daily. (Patient not taking: Reported on 10/07/2014) 30 tablet 0  . naproxen (NAPROSYN) 500 MG tablet Take 1 tablet (500 mg total) by mouth 2 (two) times daily. (Patient not taking: Reported on 10/07/2014) 30 tablet 0  . nitroGLYCERIN (NITROSTAT) 0.4 MG SL tablet Place 1 tablet (0.4 mg total) under the tongue every 5 (five) minutes x 3 doses as needed for chest pain. 25 tablet 1  . potassium chloride SA (K-DUR,KLOR-CON) 20 MEQ tablet Take 1 tablet (20 mEq total) by mouth daily. (Patient not taking: Reported on 10/07/2014) 30 tablet 1    Allergies: Allergies as of 10/07/2014  . (No Known Allergies)   Past Medical History  Diagnosis Date  . Hypertrophy of  tonsils alone   . Chronic diastolic heart failure   . Obesity, unspecified   . Obstructive sleep apnea (adult) (pediatric)   . CHF (congestive heart failure)   . Hypertension    Past Surgical History  Procedure Laterality Date  . Umbilical hernia repair     Family History  Problem Relation Age of Onset  . Diabetes Brother   . Hypertension Mother   . Diabetes Sister   . Cancer Maternal Aunt    History   Social History  . Marital Status: Single    Spouse Name: N/A  . Number of Children: 0  . Years of Education: N/A   Occupational History  . works as a Garment/textile technologist History Main Topics  . Smoking status: Never Smoker   . Smokeless tobacco: Never Used  . Alcohol Use: No  . Drug Use: Not  on file  . Sexual Activity: Not on file   Other Topics Concern  . Not on file   Social History Narrative    Review of Systems: General: Denies fever, chills, night sweats, changes in appetite HEENT: Denies headaches, ear pain, changes in vision, rhinorrhea, sore throat CV: See above  Pulm: See above  GI: Denies diarrhea, constipation, melena, hematochezia GU: Denies dysuria, hematuria, frequency Msk: Denies muscle cramps, joint pains Neuro: Denies weakness, numbness, tingling Skin: Denies rashes, bruising  Physical Exam: Blood pressure 118/67, pulse 60, temperature 98.5 F (36.9 C), temperature source Oral, resp. rate 30, height 5\' 11"  (1.803 m), weight 389 lb (176.449 kg), SpO2 100 %. General: morbidly obese man sitting up in bed, pleasant, NAD HEENT: Adam Dennis/AT, EOMI, sclera anicteric, mucus membranes moist Neck: supple, obese, difficult to appreciate any JVD CV: RRR, no m/g/r Pulm: difficult to breath sounds due to body habitus but no crackles appreciated Abd: BS+, soft, obese, non-tender  Ext: warm, 2+ pitting edema in both extremities up to knees Neuro: alert and oriented x 3, no focal deficits  Lab results: Basic Metabolic Panel:  Recent Labs  10/07/14 1051  NA 142  K 4.7  CL 104  CO2 29  GLUCOSE 101*  BUN 24*  CREATININE 1.65*  CALCIUM 8.4*   Liver Function Tests:  Recent Labs  10/07/14 1051  AST 54*  ALT 64*  ALKPHOS 44  BILITOT 0.7  PROT 6.5  ALBUMIN 2.6*   CBC:  Recent Labs  10/07/14 1051  WBC 5.6  NEUTROABS 3.0  HGB 14.4  HCT 47.4  MCV 83.9  PLT 263   Cardiac Enzymes:  Recent Labs  10/07/14 1051  TROPONINI <0.03    Imaging results:  Dg Chest 2 View  10/07/2014   CLINICAL DATA:  Short of breath for 1 week  EXAM: CHEST  2 VIEW  COMPARISON:  11/12/2013  FINDINGS: Heart top-normal in size. No mediastinal or hilar masses. Elevated left hemidiaphragm, stable. Clear lungs. No pleural effusion or pneumothorax.  Bony thorax is intact.   IMPRESSION: No active cardiopulmonary disease.   Electronically Signed   By: Lajean Manes M.D.   On: 10/07/2014 13:18    Other results: EKG: Sinus rhythm, no significant changes from previous EKG  Assessment & Plan by Problem:  Acute on Chronic CHF Exacerbation: Patient presented with a 3-4 day history of progressively worsening dyspnea, orthopnea, PND, and leg swelling in the setting of medication noncompliance and underlying CHF. Patient states he has been taking lasix for last month but also notes he ran out about 1 month ago. Per call to his  pharmacy he recently refilled lasix and atenolol on 09/27/14. His last echo was in July 2014 which showed an EF 35-40% and grade 1 diastolic dysfunction. His baseline weight appears to be 325-330 lbs and he is currently at 375 lbs. His BNP is elevated at 574. CXR with possibly some mild vascular congestion but no pleural effusions. His clinical picture is most consistent with a CHF exacerbation. He noted atypical burning chest pain with ambulation, but troponin is negative and EKG not suggestive of ischemic changes. Very low suspicion for ACS. He was given lasix 80 mg IV in the ED and responded well with 500 ml UOP. Will continue with aggressive diuresis.   - Start Lasix 80 mg IV TID - Switch atenolol to Coreg 6.25 mg BID in AM as there is better evidence for decreased mortality with Coreg - Aspirin 81 mg daily - 2D Echo ordered  - Daily weights, strict I&Os - Check HbA1c  - CPAP at night for likely undiagnosed OSA given his morbid obesity  - Oxygen therapy as needed  - Cardiac monitoring   AKI on CKD Stage II: Cr 1.65 on admission. His baseline appears to be 1.2. Likely due to acute CHF exacerbation. - Repeat bmet in AM  Hypoalbuminemia: Albumin 2.6 on admission. Within normal range 1 year ago at 3.7. Will check UA to evaluate for proteinuria and possible nephrotic syndrome.  - Check UA - Consider nutrition consult   Elevated AST and ALT: AST 54 and  ALT 64 on admission. Previously normal 1 year ago. Patient reports only occasional alcohol use. Will check hepatitis panel given he has hypoalbuminemia as well.  - Check acute hepatitis panel  HTN: BP 113/97 on admission. Patient is prescribed amlodipine 10 mg daily, atenolol 50 mg daily, lisinopril 40 mg daily, and lasix 20 mg daily. However, he is likely only taking atenolol and lasix as these were the only medications he filled recently (filled 5/3 per his pharmacy).  - Change atenolol to coreg 6.25 mg BID as there is strong evidence for decreasing mortality with coreg in HF patients - Start Lasix 80 mg IV TID - Hold lisinopril because of AKI and amlodipine because normotensive currently   Hyperlipidemia: Last lipid panel in March 2015 shows Chol 131, Trigly 51, HDL 53, and LDL 68. Patient is prescribed atorvastatin 10 mg daily but likely has not been taking this medication as he was not familiar with the name and stated he was only taking lasix and a BP med. Per call to his pharmacy he only recently refilled atenolol and lasix on 09/27/14. He would benefit from high statin therapy as his ASCVD risk is 8.0%.  - Check lipid panel - Start atorvastatin 40 mg daily    Diet: Heart healthy  VTE PPx: Lovenox  Dispo: Disposition is deferred at this time, awaiting improvement of current medical problems. Anticipated discharge in approximately 2-3 day(s).   The patient does have a current PCP (Adam Marek, MD) and does need an [facility] hospital follow-up appointment after discharge.  The patient does not have transportation limitations that hinder transportation to clinic appointments.  Signed: Juliet Rude, MD 10/07/2014, 1:58 PM

## 2014-10-07 NOTE — ED Notes (Signed)
When Pt was brought from Triage and first hooked up to Pulse Ox Monitor reading was 86%. Placed Pt on 2L O2. Pt quickly went up to 97%. Took Pt off O2 while continuing to get his vitals. Pt stayed at 97% for about 5 minutes before dropping down to 85% again. Placed patient back on 2L nasal O2.

## 2014-10-07 NOTE — ED Notes (Signed)
Pt here for SOB, leg swelling. Hx of heart failure.

## 2014-10-07 NOTE — ED Provider Notes (Signed)
CSN: [id number]     Arrival date & time 10/07/14  1030 History   First MD Initiated Contact with Patient 10/07/14 1034     Chief Complaint  Patient presents with  . Shortness of Breath     (Consider location/radiation/quality/duration/timing/severity/associated sxs/prior Treatment) HPI Comments: Patient presents for evaluation of shortness of breath. Patient reports increased lower extremity swelling, shortness of breath as well as orthopnea and dyspnea on exertion over the past 2 or 3 days. Patient reports a history of congestive heart failure. He was off of his Lasix up until several weeks ago because he cannot afford it, but he tells me he has been taking it for the last 3 weeks. Patient reports that he can only walk a few steps now without getting severely short of breath and he does get some pain in the area of the shoulder when he walks that resolves when he rests.   Past Medical History  Diagnosis Date  . Hypertrophy of tonsils alone   . Chronic diastolic heart failure   . Obesity, unspecified   . Obstructive sleep apnea (adult) (pediatric)   . CHF (congestive heart failure)   . Hypertension    Past Surgical History  Procedure Laterality Date  . Umbilical hernia repair     Family History  Problem Relation Age of Onset  . Diabetes Brother   . Hypertension Mother   . Diabetes Sister   . Cancer Maternal Aunt    History  Substance Use Topics  . Smoking status: Never Smoker   . Smokeless tobacco: Never Used  . Alcohol Use: No    Review of Systems  Respiratory: Positive for shortness of breath.   Cardiovascular: Positive for chest pain and leg swelling.  All other systems reviewed and are negative.     Allergies  Review of patient's allergies indicates no known allergies.  Home Medications   Prior to Admission medications   Medication Sig Start Date End Date Taking? Authorizing Provider  aspirin EC 81 MG tablet Take 81 mg by mouth daily.   Yes Historical  Provider, MD  atenolol (TENORMIN) 50 MG tablet Take 50 mg by mouth daily. 10/17/12  Yes Julianne Rice, MD  furosemide (LASIX) 20 MG tablet Take 20 mg by mouth daily.   Yes Historical Provider, MD  amLODipine (NORVASC) 10 MG tablet Take 1 tablet (10 mg total) by mouth daily. Patient not taking: Reported on 10/07/2014 10/17/12   Julianne Rice, MD  aspirin 325 MG tablet Take 1 tablet (325 mg total) by mouth daily. Patient not taking: Reported on 10/07/2014 10/17/12   Julianne Rice, MD  atorvastatin (LIPITOR) 10 MG tablet Take 1 tablet (10 mg total) by mouth daily. Patient not taking: Reported on 10/07/2014 10/17/12   Julianne Rice, MD  lisinopril (PRINIVIL,ZESTRIL) 40 MG tablet Take 1 tablet (40 mg total) by mouth daily. Patient not taking: Reported on 10/07/2014 10/17/12   Julianne Rice, MD  naproxen (NAPROSYN) 500 MG tablet Take 1 tablet (500 mg total) by mouth 2 (two) times daily. Patient not taking: Reported on 10/07/2014 11/12/13   Evalee Jefferson, PA-C  nitroGLYCERIN (NITROSTAT) 0.4 MG SL tablet Place 1 tablet (0.4 mg total) under the tongue every 5 (five) minutes x 3 doses as needed for chest pain. 12/17/12   Dixie Dials, MD  potassium chloride SA (K-DUR,KLOR-CON) 20 MEQ tablet Take 1 tablet (20 mEq total) by mouth daily. Patient not taking: Reported on 10/07/2014 12/17/12   Dixie Dials, MD   BP 136/85 mmHg  Pulse 79  Temp(Src) 98.5 F (36.9 C) (Oral)  Resp 28  Ht 5\' 11"  (1.803 m)  Wt 389 lb (176.449 kg)  BMI 54.28 kg/m2  SpO2 88% Physical Exam  Constitutional: He is oriented to person, place, and time. He appears well-developed and well-nourished. No distress.  HENT:  Head: Normocephalic and atraumatic.  Right Ear: Hearing normal.  Left Ear: Hearing normal.  Nose: Nose normal.  Mouth/Throat: Oropharynx is clear and moist and mucous membranes are normal.  Eyes: Conjunctivae and EOM are normal. Pupils are equal, round, and reactive to light.  Neck: Normal range of motion. Neck supple.   Cardiovascular: Regular rhythm, S1 normal and S2 normal.  Exam reveals no gallop and no friction rub.   No murmur heard. Pulmonary/Chest: Effort normal. No respiratory distress. He has decreased breath sounds. He exhibits no tenderness.  Abdominal: Soft. Normal appearance and bowel sounds are normal. There is no hepatosplenomegaly. There is no tenderness. There is no rebound, no guarding, no tenderness at McBurney's point and negative Murphy's sign. No hernia.  Musculoskeletal: Normal range of motion. He exhibits edema.  Neurological: He is alert and oriented to person, place, and time. He has normal strength. No cranial nerve deficit or sensory deficit. Coordination normal. GCS eye subscore is 4. GCS verbal subscore is 5. GCS motor subscore is 6.  Skin: Skin is warm, dry and intact. No rash noted. No cyanosis.  Psychiatric: He has a normal mood and affect. His speech is normal and behavior is normal. Thought content normal.  Nursing note and vitals reviewed.   ED Course  Procedures (including critical care time) Labs Review Labs Reviewed  CBC WITH DIFFERENTIAL/PLATELET - Abnormal; Notable for the following:    MCH 25.5 (*)    RDW 17.0 (*)    Monocytes Relative 18 (*)    All other components within normal limits  COMPREHENSIVE METABOLIC PANEL - Abnormal; Notable for the following:    Glucose, Bld 101 (*)    BUN 24 (*)    Creatinine, Ser 1.65 (*)    Calcium 8.4 (*)    Albumin 2.6 (*)    AST 54 (*)    ALT 64 (*)    GFR calc non Af Amer 46 (*)    GFR calc Af Amer 54 (*)    All other components within normal limits  BRAIN NATRIURETIC PEPTIDE - Abnormal; Notable for the following:    B Natriuretic Peptide 574.2 (*)    All other components within normal limits  TROPONIN I    Imaging Review Dg Chest 2 View  10/07/2014   CLINICAL DATA:  Short of breath for 1 week  EXAM: CHEST  2 VIEW  COMPARISON:  11/12/2013  FINDINGS: Heart top-normal in size. No mediastinal or hilar masses.  Elevated left hemidiaphragm, stable. Clear lungs. No pleural effusion or pneumothorax.  Bony thorax is intact.  IMPRESSION: No active cardiopulmonary disease.   Electronically Signed   By: Lajean Manes M.D.   On: 10/07/2014 13:18     EKG Interpretation None      MDM   Final diagnoses:  Shortness of breath  CHF  Patient with history of congestive heart failure and obstructive sleep apnea presents to the ER for evaluation of progressively worsening shortness of breath. Patient reports that he has noticed increased swelling of his legs associated with the shortness of breath. He did spend some time off of his Lasix recently, but reports that he has been on it for the last 2  or 3 weeks.  At arrival to the ER, patient is mildly dyspneic and hypoxic. Room air oxygen saturation was 86%. He improved to near 100% with supplemental oxygen 2 L by nasal cannula.  Patient's workup has been unremarkable. He is not expressing any chest pain, but does report some tightness when he walks that improves when he rests. No ischemia on EKG. Troponin normal.  She has been given Lasix here in the ER and has initiated diuresis, but is still hypoxic. He will require hospitalization for further management.    Orpah Greek, MD 10/07/14 1416

## 2014-10-08 ENCOUNTER — Inpatient Hospital Stay (HOSPITAL_COMMUNITY): Payer: Medicaid Other

## 2014-10-08 DIAGNOSIS — Z9989 Dependence on other enabling machines and devices: Secondary | ICD-10-CM

## 2014-10-08 DIAGNOSIS — G4733 Obstructive sleep apnea (adult) (pediatric): Secondary | ICD-10-CM

## 2014-10-08 LAB — HEMOGLOBIN A1C
HEMOGLOBIN A1C: 6.7 % — AB (ref 4.8–5.6)
MEAN PLASMA GLUCOSE: 146 mg/dL

## 2014-10-08 LAB — BASIC METABOLIC PANEL
ANION GAP: 9 (ref 5–15)
BUN: 20 mg/dL (ref 6–20)
CALCIUM: 8.3 mg/dL — AB (ref 8.9–10.3)
CHLORIDE: 96 mmol/L — AB (ref 101–111)
CO2: 36 mmol/L — ABNORMAL HIGH (ref 22–32)
Creatinine, Ser: 1.57 mg/dL — ABNORMAL HIGH (ref 0.61–1.24)
GFR calc Af Amer: 57 mL/min — ABNORMAL LOW (ref >60)
GFR calc non Af Amer: 49 mL/min — ABNORMAL LOW (ref >60)
Glucose, Bld: 100 mg/dL — ABNORMAL HIGH (ref 65–99)
Potassium: 3.6 mmol/L (ref 3.5–5.1)
SODIUM: 141 mmol/L (ref 135–145)

## 2014-10-08 LAB — HIV ANTIBODY (ROUTINE TESTING W REFLEX): HIV Screen 4th Generation wRfx: NONREACTIVE

## 2014-10-08 LAB — GLUCOSE, CAPILLARY: Glucose-Capillary: 107 mg/dL — ABNORMAL HIGH (ref 65–99)

## 2014-10-08 MED ORDER — ENOXAPARIN SODIUM 100 MG/ML ~~LOC~~ SOLN
85.0000 mg | SUBCUTANEOUS | Status: DC
  Administered 2014-10-08 – 2014-10-10 (×3): 85 mg via SUBCUTANEOUS
  Filled 2014-10-08 (×4): qty 1

## 2014-10-08 MED ORDER — CARVEDILOL 6.25 MG PO TABS
6.2500 mg | ORAL_TABLET | Freq: Two times a day (BID) | ORAL | Status: DC
  Administered 2014-10-08 – 2014-10-12 (×9): 6.25 mg via ORAL
  Filled 2014-10-08 (×11): qty 1

## 2014-10-08 MED ORDER — POTASSIUM CHLORIDE CRYS ER 20 MEQ PO TBCR
40.0000 meq | EXTENDED_RELEASE_TABLET | ORAL | Status: AC
  Administered 2014-10-08 (×2): 40 meq via ORAL
  Filled 2014-10-08 (×2): qty 2

## 2014-10-08 MED ORDER — POTASSIUM CHLORIDE CRYS ER 20 MEQ PO TBCR: 40.0000 meq | EXTENDED_RELEASE_TABLET | Freq: Once | ORAL | Status: DC

## 2014-10-08 NOTE — Progress Notes (Signed)
Placed patient on CPAP for the night.  Patient is tolerating well at this time. 

## 2014-10-08 NOTE — Progress Notes (Signed)
  Date: 10/08/2014  Patient name: Adam Dennis  Medical record number: [id number]  Date of birth: [birth year]   I have seen and evaluated Adam Dennis and discussed their care with the Residency Team. Mr Anstett is a 54 yo male with known combined diastolic and systolic HF (unknown if ischemic but appears to never had had ischemic W/U before), very severe OSA (10/14/2006 AHI 161, Rx CPAP at  13, notes indicate poor compliance), HTN, and obesity BMI 53. He was admitted for dyspnea. On the day of admit, he required 45 min to walk from bus to work even though normally only takes 15 min. States HF for over 10 yrs and required about 3 hospitalizations during that time. States he does well as long as he takes his meds (monthly cost $90 at [facility]) but not working much now and can't afford them. Weight up 50 lbs but states this has occurred over yrs.   Original notes only indicated diastolic HF. EF was first low in 2014. During 2014 admit, needed acetazolamide bc Co2 35.   Filed Vitals:   10/08/14 0750  BP: 127/83  Pulse: 64  Temp: 98.2 F (36.8 C)  Resp: 20   Weight down 19 lbs since admit (?accuracy) Net -2 L  Gen Obese.  Exam challenging 2/2 obesity + edema LE (hanging dependent)  BNP 574  Assessment and Plan: I have seen and evaluated the patient as outlined above. I agree with the formulated Assessment and Plan as detailed in the residents' admission note, with the following changes:   1. Acute on chronic HF ? Ischemic - exam and W/U challenging 2/2 obesity. BNP is up but CXR doesn't show pul edema and lungs clear but exam limited. Weight is 370 lbs today - D/C'd 2014 at 360 lbs. Alb 2.6. Most of fluid seems to be peripheral - indicating some component of RHF which would make sense due to severe, untx OSA. I agree with Dr Arcelia Jew that he may need R (assess RV fxn and pul pressures) and L (eval ischemic dz since focal wall motion abnl on last ECHO) heart cath as other means of assessment  limited by obesity. Agree with diuresis, BB, and holding ACEI.  2. A on chronic kidney failure - hold aceI. Follow cr with diuresis.   3. Known severe OSA - cont CPAP in hospital. Will need repeat sleep study as outpt.   4. Financial difficulties - we will try to change meds to al l$4 meds at [facility]. Needs med mgmt as low health care literacy. Care mgmt.   Bartholomew Crews, MD 5/14/201610:51 AM

## 2014-10-08 NOTE — Consult Note (Signed)
Reason for Consult: Decompensated congestive heart failure Referring Physician: Teaching service  Adam Dennis is an 54 y.o. male.  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 Patient states he has been noncompliant to medication as he cannot afford to buy them also noncompliant to diet. Denies any chest pain nausea vomiting diaphoresis denies any palpitation lightheadedness or syncope. Patient received IV Lasix with improvement in his symptoms. Patient presently denies any chest pain states his breathing and leg swelling is gradually improving. Denies any fever or chills. Denies any history of EtOH abuse. Denies any thyroid problems  Past Medical History  Diagnosis Date  . Hypertrophy of tonsils alone   . Chronic combined systolic and diastolic heart failure   . Obesity, unspecified   . Obstructive sleep apnea (adult) (pediatric)   . Hypertension   . CHF (congestive heart failure)     Past Surgical History  Procedure Laterality Date  . Umbilical hernia repair      Family History  Problem Relation Age of Onset  . Diabetes Brother   . Hypertension Mother   . Diabetes Sister   . Cancer Maternal Aunt     Social History:  reports that he has never smoked. He has never used smokeless tobacco. He reports that he does not drink alcohol or use illicit drugs.  Allergies: No Known Allergies  Medications: I have reviewed the patient's current medications.  Results for orders placed or performed during the hospital encounter  of 10/07/14 (from the past 48 hour(s))  CBC with Differential/Platelet     Status: Abnormal   Collection Time: 10/07/14 10:51 AM  Result Value Ref Range   WBC 5.6 4.0 - 10.5 K/uL   RBC 5.65 4.22 - 5.81 MIL/uL   Hemoglobin 14.4 13.0 - 17.0 g/dL   HCT 47.4 39.0 - 52.0 %   MCV 83.9 78.0 - 100.0 fL   MCH 25.5 (L) 26.0 - 34.0 pg   MCHC 30.4 30.0 - 36.0 g/dL   RDW 17.0 (H) 11.5 - 15.5 %   Platelets 263 150 - 400 K/uL   Neutrophils Relative % 53 43 - 77 %   Neutro Abs 3.0 1.7 - 7.7 K/uL   Lymphocytes Relative 27 12 - 46 %   Lymphs Abs 1.5 0.7 - 4.0 K/uL   Monocytes Relative 18 (H) 3 - 12 %   Monocytes Absolute 1.0 0.1 - 1.0 K/uL   Eosinophils Relative 1 0 - 5 %   Eosinophils Absolute 0.0 0.0 - 0.7 K/uL   Basophils Relative 1 0 - 1 %   Basophils Absolute 0.0 0.0 - 0.1 K/uL  Comprehensive metabolic panel     Status: Abnormal   Collection Time: 10/07/14 10:51 AM  Result Value Ref Range   Sodium 142 135 - 145 mmol/L   Potassium 4.7 3.5 - 5.1 mmol/L   Chloride 104 101 - 111 mmol/L   CO2 29 22 - 32 mmol/L   Glucose, Bld 101 (H) 65 - 99 mg/dL   BUN 24 (H) 6 - 20 mg/dL   Creatinine, Ser 1.65 (H) 0.61 - 1.24 mg/dL   Calcium 8.4 (L) 8.9 - 10.3  mg/dL   Total Protein 6.5 6.5 - 8.1 g/dL   Albumin 2.6 (L) 3.5 - 5.0 g/dL   AST 54 (H) 15 - 41 U/L   ALT 64 (H) 17 - 63 U/L   Alkaline Phosphatase 44 38 - 126 U/L   Total Bilirubin 0.7 0.3 - 1.2 mg/dL   GFR calc non Af Amer 46 (L) >60 mL/min   GFR calc Af Amer 54 (L) >60 mL/min    Comment: (NOTE) The eGFR has been calculated using the CKD EPI equation. This calculation has not been validated in all clinical situations. eGFR's persistently <60 mL/min signify possible Chronic Kidney Disease.    Anion gap 9 5 - 15  Troponin I     Status: None   Collection Time: 10/07/14 10:51 AM  Result Value Ref Range   Troponin I <0.03 <0.031 ng/mL    Comment:        NO INDICATION OF MYOCARDIAL INJURY.   Brain natriuretic peptide     Status: Abnormal    Collection Time: 10/07/14 10:51 AM  Result Value Ref Range   B Natriuretic Peptide 574.2 (H) 0.0 - 100.0 pg/mL  Hepatitis panel, acute     Status: None   Collection Time: 10/07/14  3:47 PM  Result Value Ref Range   Hepatitis B Surface Ag NEGATIVE NEGATIVE   HCV Ab NEGATIVE NEGATIVE   Hep A IgM NON REACTIVE NON REACTIVE    Comment: (NOTE) Effective April 11, 2014, Hepatitis Acute Panel (test code [phone]) will be revised to automatically reflex to the Hepatitis C Viral RNA, Quantitative, Real-Time PCR assay if the Hepatitis C antibody screening result is Reactive. This action is being taken to ensure that the CDC/USPSTF recommended HCV diagnostic algorithm with the appropriate test reflex needed for accurate interpretation is followed.    Hep B C IgM NON REACTIVE NON REACTIVE    Comment: (NOTE) High levels of Hepatitis B Core IgM antibody are detectable during the acute stage of Hepatitis B. This antibody is used to differentiate current from past HBV infection. Performed at [facility]   HIV antibody     Status: None   Collection Time: 10/07/14  3:47 PM  Result Value Ref Range   HIV Screen 4th Generation wRfx Non Reactive Non Reactive    Comment: (NOTE) Performed At: [facility] [place], [place] [id number] Lindon Romp MD [id number]   Hemoglobin A1c     Status: Abnormal   Collection Time: 10/07/14  3:47 PM  Result Value Ref Range   Hgb A1c MFr Bld 6.7 (H) 4.8 - 5.6 %    Comment: (NOTE)         Pre-diabetes: 5.7 - 6.4         Diabetes: >6.4         Glycemic control for adults with diabetes: <7.0    Mean Plasma Glucose 146 mg/dL    Comment: (NOTE) Performed At: [facility] [place], [place] [id number] Lindon Romp MD [id number]   Lipid panel     Status: None   Collection Time: 10/07/14  3:47 PM  Result Value Ref Range   Cholesterol 145 0 - 200 mg/dL   Triglycerides 66 <150 mg/dL   HDL 52 >40  mg/dL   Total CHOL/HDL Ratio 2.8 RATIO   VLDL 13 0 - 40 mg/dL   LDL Cholesterol 80 0 - 99 mg/dL    Comment:        Total Cholesterol/HDL:CHD  Risk Coronary Heart Disease Risk Table                     Men   Women  1/2 Average Risk   3.4   3.3  Average Risk       5.0   4.4  2 X Average Risk   9.6   7.1  3 X Average Risk  23.4   11.0        Use the calculated Patient Ratio above and the CHD Risk Table to determine the patient's CHD Risk.        ATP III CLASSIFICATION (LDL):  <100     mg/dL   Optimal  100-129  mg/dL   Near or Above                    Optimal  130-159  mg/dL   Borderline  160-189  mg/dL   High  >190     mg/dL   Very High   Basic metabolic panel     Status: Abnormal   Collection Time: 10/08/14  2:53 AM  Result Value Ref Range   Sodium 141 135 - 145 mmol/L   Potassium 3.6 3.5 - 5.1 mmol/L    Comment: DELTA CHECK NOTED   Chloride 96 (L) 101 - 111 mmol/L   CO2 36 (H) 22 - 32 mmol/L   Glucose, Bld 100 (H) 65 - 99 mg/dL   BUN 20 6 - 20 mg/dL   Creatinine, Ser 1.57 (H) 0.61 - 1.24 mg/dL   Calcium 8.3 (L) 8.9 - 10.3 mg/dL   GFR calc non Af Amer 49 (L) >60 mL/min   GFR calc Af Amer 57 (L) >60 mL/min    Comment: (NOTE) The eGFR has been calculated using the CKD EPI equation. This calculation has not been validated in all clinical situations. eGFR's persistently <60 mL/min signify possible Chronic Kidney Disease.    Anion gap 9 5 - 15  Glucose, capillary     Status: Abnormal   Collection Time: 10/08/14  6:03 AM  Result Value Ref Range   Glucose-Capillary 107 (H) 65 - 99 mg/dL    Dg Chest 2 View  10/07/2014   CLINICAL DATA:  Short of breath for 1 week  EXAM: CHEST  2 VIEW  COMPARISON:  11/12/2013  FINDINGS: Heart top-normal in size. No mediastinal or hilar masses. Elevated left hemidiaphragm, stable. Clear lungs. No pleural effusion or pneumothorax.  Bony thorax is intact.  IMPRESSION: No active cardiopulmonary disease.   Electronically Signed   By: Lajean Manes  M.D.   On: 10/07/2014 13:18    Review of Systems  Constitutional: Negative for fever and chills.  HENT: Negative for hearing loss.   Eyes: Negative for double vision.  Respiratory: Positive for shortness of breath. Negative for cough and sputum production.   Cardiovascular: Positive for orthopnea, leg swelling and PND. Negative for chest pain, palpitations and claudication.  Gastrointestinal: Negative for nausea, vomiting and abdominal pain.  Neurological: Negative for dizziness and headaches.   Blood pressure 127/83, pulse 64, temperature 98.2 F (36.8 C), temperature source Oral, resp. rate 20, height 5' 11"  (1.803 m), weight 167.831 kg (370 lb), SpO2 97 %. Physical Exam  HENT:  Head: Normocephalic and atraumatic.  Eyes: Conjunctivae are normal. Left eye exhibits no discharge. No scleral icterus.  Neck: Normal range of motion. Neck supple. No tracheal deviation present. No thyromegaly present.  Cardiovascular: Normal rate and regular rhythm.  Exam reveals  gallop (Soft S3 gallop noted).   No murmur heard. Respiratory:  Decreased breath sound at bases  GI: Soft. Bowel sounds are normal. He exhibits distension. There is no tenderness.  Musculoskeletal:  No clubbing cyanosis 2+ edema noted    Assessment/Plan: Resolving decompensated systolic/diastolic congestive heart failure secondary to noncompliance to medication and diet Hypertensive heart disease with systolic and diastolic dysfunction. Morbid obesity Obstructive sleep apnea/obesity hypoventilation syndrome Chronic kidney disease stage II Metabolic alkalosis secondary to diuretics Plan Agree with present management Hold Ace for now in view of worsening renal function once evoluemic reduce his dose of diuretics and restart ace if renal function stable Consider adding acetazolamide in view of alkalosis Nutrition consult Social service consult for assistance with medication Check 2-D echo Charolette Forward 10/08/2014, 12:05 PM

## 2014-10-08 NOTE — Progress Notes (Signed)
Utilization review completed.  

## 2014-10-08 NOTE — Consult Note (Signed)
Error

## 2014-10-08 NOTE — Progress Notes (Signed)
Subjective: No acute events overnight. Patient used the CPAP for a few hours last night. He states the nurse told him is oxygen saturation dropped so the nasal cannula was placed on him. He reports he is breathing comfortably now. He denies orthopnea and PND overnight. He denies any chest pain. He is diuresing well with ~2L UOP.   Objective: Vital signs in last 24 hours: Filed Vitals:   10/07/14 2026 10/08/14 0005 10/08/14 0459 10/08/14 0750  BP: 127/76  138/78 127/83  Pulse: 70 70 66 64  Temp: 98.3 F (36.8 C)  98.5 F (36.9 C) 98.2 F (36.8 C)  TempSrc: Oral  Oral Oral  Resp: 18 18 18 20   Height:      Weight:   370 lb (167.831 kg)   SpO2: 93%  98% 97%   Weight change:   Intake/Output Summary (Last 24 hours) at 10/08/14 0906 Last data filed at 10/08/14 0400  Gross per 24 hour  Intake    480 ml  Output   2446 ml  Net  -1966 ml   Physical Exam General: morbidly obese man sitting up in bed, pleasant, NAD HEENT: [place]/AT, EOMI, sclera anicteric, mucus membranes moist Neck: supple, obese, difficult to appreciate any JVD CV: RRR, no m/g/r Pulm: difficult to breath sounds due to body habitus but no crackles appreciated Abd: BS+, soft, obese, non-tender  Ext: warm, 2+ pitting edema in both extremities up to knees Neuro: alert and oriented x 3, no focal deficits  Lab Results: Basic Metabolic Panel:  Recent Labs Lab 10/07/14 1051 10/08/14 0253  NA 142 141  K 4.7 3.6  CL 104 96*  CO2 29 36*  GLUCOSE 101* 100*  BUN 24* 20  CREATININE 1.65* 1.57*  CALCIUM 8.4* 8.3*   Liver Function Tests:  Recent Labs Lab 10/07/14 1051  AST 54*  ALT 64*  ALKPHOS 44  BILITOT 0.7  PROT 6.5  ALBUMIN 2.6*   CBC:  Recent Labs Lab 10/07/14 1051  WBC 5.6  NEUTROABS 3.0  HGB 14.4  HCT 47.4  MCV 83.9  PLT 263   Cardiac Enzymes:  Recent Labs Lab 10/07/14 1051  TROPONINI <0.03   CBG:  Recent Labs Lab 10/08/14 0603  GLUCAP 107*   Hemoglobin A1C:  Recent  Labs Lab 10/07/14 1547  HGBA1C 6.7*   Fasting Lipid Panel:  Recent Labs Lab 10/07/14 1547  CHOL 145  HDL 52  LDLCALC 80  TRIG 66  CHOLHDL 2.8   Studies/Results: Dg Chest 2 View  10/07/2014   CLINICAL DATA:  Short of breath for 1 week  EXAM: CHEST  2 VIEW  COMPARISON:  11/12/2013  FINDINGS: Heart top-normal in size. No mediastinal or hilar masses. Elevated left hemidiaphragm, stable. Clear lungs. No pleural effusion or pneumothorax.  Bony thorax is intact.  IMPRESSION: No active cardiopulmonary disease.   Electronically Signed   By: Lajean Manes M.D.   On: 10/07/2014 13:18   Medications: I have reviewed the patient's current medications.  Assessment/Plan:  Acute on Chronic CHF Exacerbation: Diuresing well with ~2L UOP overnight. Weight 376 lbs on admission, down to 370 lbs today. Baseline 325-330 lbs. Noted that patient has never had cardiac catheterization and he warrants both right and left heart cath given his multiple risk factors and low EF. Will discuss with cardiology.  - Continue Lasix 80 mg IV TID - Continue Coreg 6.25 mg BID  - Continue Aspirin 81 mg daily - Holding ACE because of AKI - f/u 2D Echo   -  Daily weights, strict I&Os - Check HbA1c>> 6.7 however his blood sugar this AM on bmet is 100. Given the discordance between these two results he does not meet criteria for diabetes. He will need to have a repeat HbA1c as an outpatient.    - CPAP at night for likely undiagnosed OSA given his morbid obesity  - Oxygen therapy as needed  - Cardiac monitoring   AKI on CKD Stage II: Cr 1.65 on admission, now 1.57 this AM. His baseline appears to be 1.2. Likely due to acute CHF exacerbation. - Repeat bmet in AM  Hypoalbuminemia: Albumin 2.6 on admission. Within normal range 1 year ago at 3.7. Will check UA to evaluate for proteinuria and possible nephrotic syndrome.  - f/u UA - Consider nutrition consult   Elevated AST and ALT: AST 54 and ALT 64 on admission.  Previously normal 1 year ago. Patient reports only occasional alcohol use. Acute hepatitis panel negative for acute/chronic infection. Suspect fatty liver disease. Will continue to monitor for now. May warrant RUQ [place] in future.  - He will need Hep A and Hep B vaccines   HTN: BP 113/97 on admission, currently in [id number] systolic. Patient is prescribed amlodipine 10 mg daily, atenolol 50 mg daily, lisinopril 40 mg daily, and lasix 20 mg daily. However, he is likely only taking atenolol and lasix as these were the only medications he filled recently (filled 5/3 per his pharmacy).  - Continue Coreg 6.25 mg BID  - Continue Lasix 80 mg IV TID - Hold lisinopril because of AKI and amlodipine because normotensive currently   Hyperlipidemia: Repeat lipid panel shows Chol 145, Trigly 66, HDL 52, and LDL 80.   - Continue atorvastatin 40 mg daily   Diet: Heart healthy VTE PPx: Lovenox SQ Dispo: Disposition is deferred at this time, awaiting improvement of current medical problems.  Anticipated discharge in approximately 2-3 day(s).   The patient does have a current PCP (Lorayne Marek, MD) and does need an [facility] hospital follow-up appointment after discharge.  The patient does not have transportation limitations that hinder transportation to clinic appointments.  .Services Needed at time of discharge: Y = Yes, Blank = No PT:   OT:   RN:   Equipment:   Other:     LOS: 1 day   Juliet Rude, MD 10/08/2014, 9:06 AM

## 2014-10-09 ENCOUNTER — Inpatient Hospital Stay (HOSPITAL_COMMUNITY): Payer: Medicaid Other

## 2014-10-09 DIAGNOSIS — E873 Alkalosis: Secondary | ICD-10-CM

## 2014-10-09 LAB — URINALYSIS, ROUTINE W REFLEX MICROSCOPIC
BILIRUBIN URINE: NEGATIVE
Glucose, UA: NEGATIVE mg/dL
Hgb urine dipstick: NEGATIVE
Ketones, ur: NEGATIVE mg/dL
LEUKOCYTES UA: NEGATIVE
Nitrite: NEGATIVE
PH: 7.5 (ref 5.0–8.0)
Protein, ur: >300 mg/dL — AB
Specific Gravity, Urine: 1.023 (ref 1.005–1.030)
Urobilinogen, UA: 1 mg/dL (ref 0.0–1.0)

## 2014-10-09 LAB — BASIC METABOLIC PANEL
Anion gap: 12 (ref 5–15)
BUN: 16 mg/dL (ref 6–20)
CHLORIDE: 93 mmol/L — AB (ref 101–111)
CO2: 37 mmol/L — AB (ref 22–32)
CREATININE: 1.56 mg/dL — AB (ref 0.61–1.24)
Calcium: 8.2 mg/dL — ABNORMAL LOW (ref 8.9–10.3)
GFR calc Af Amer: 57 mL/min — ABNORMAL LOW (ref >60)
GFR, EST NON AFRICAN AMERICAN: 49 mL/min — AB (ref >60)
GLUCOSE: 152 mg/dL — AB (ref 65–99)
POTASSIUM: 3.8 mmol/L (ref 3.5–5.1)
SODIUM: 142 mmol/L (ref 135–145)

## 2014-10-09 LAB — URINE MICROSCOPIC-ADD ON

## 2014-10-09 MED ORDER — POTASSIUM CHLORIDE CRYS ER 20 MEQ PO TBCR
40.0000 meq | EXTENDED_RELEASE_TABLET | Freq: Two times a day (BID) | ORAL | Status: AC
  Administered 2014-10-09 (×2): 40 meq via ORAL
  Filled 2014-10-09 (×2): qty 2

## 2014-10-09 MED ORDER — ACETAZOLAMIDE SODIUM 500 MG IJ SOLR
500.0000 mg | Freq: Once | INTRAMUSCULAR | Status: AC
  Administered 2014-10-09: 500 mg via INTRAVENOUS
  Filled 2014-10-09: qty 500

## 2014-10-09 NOTE — Progress Notes (Signed)
Subjective: Feeling much better, is please with weight coming off, notes the was able to walk without dyspnea yesterday, and sleep flat.  Objective: Vital signs in last 24 hours: Filed Vitals:   10/08/14 0750 10/08/14 1430 10/08/14 2002 10/09/14 0512  BP: 127/83 126/88 135/64 120/75  Pulse: 64 63 68 65  Temp: 98.2 F (36.8 C) 97.4 F (36.3 C) 98.2 F (36.8 C) 97.4 F (36.3 C)  TempSrc: Oral Oral Oral Oral  Resp: 20 20 18 18   Height:      Weight:    360 lb 14.4 oz (163.703 kg)  SpO2: 97% 90% 92% 92%   Weight change: -28 lb 1.6 oz (-12.746 kg)  Intake/Output Summary (Last 24 hours) at 10/09/14 0853 Last data filed at 10/09/14 0600  Gross per 24 hour  Intake   1200 ml  Output   3150 ml  Net  -1950 ml   Physical Exam General: resting in bed, laying flat  HEENT: PERRL, EOMI, no scleral icterus Cardiac: RRR, no rubs, murmurs or gallops Pulm: no respiratory distress, CTAB Abd: obese, soft, nontender, nondistended, BS present Ext: warm and well perfused, 1+ pedal edema Neuro: alert and oriented X3  Lab Results: Basic Metabolic Panel:  Recent Labs Lab 10/07/14 1051 10/08/14 0253  NA 142 141  K 4.7 3.6  CL 104 96*  CO2 29 36*  GLUCOSE 101* 100*  BUN 24* 20  CREATININE 1.65* 1.57*  CALCIUM 8.4* 8.3*   Liver Function Tests:  Recent Labs Lab 10/07/14 1051  AST 54*  ALT 64*  ALKPHOS 44  BILITOT 0.7  PROT 6.5  ALBUMIN 2.6*   CBC:  Recent Labs Lab 10/07/14 1051  WBC 5.6  NEUTROABS 3.0  HGB 14.4  HCT 47.4  MCV 83.9  PLT 263   Cardiac Enzymes:  Recent Labs Lab 10/07/14 1051  TROPONINI <0.03   CBG:  Recent Labs Lab 10/08/14 0603  GLUCAP 107*   Hemoglobin A1C:  Recent Labs Lab 10/07/14 1547  HGBA1C 6.7*   Fasting Lipid Panel:  Recent Labs Lab 10/07/14 1547  CHOL 145  HDL 52  LDLCALC 80  TRIG 66  CHOLHDL 2.8   Studies/Results: Dg Chest 2 View  10/07/2014   CLINICAL DATA:  Short of breath for 1 week  EXAM: CHEST  2 VIEW   COMPARISON:  11/12/2013  FINDINGS: Heart top-normal in size. No mediastinal or hilar masses. Elevated left hemidiaphragm, stable. Clear lungs. No pleural effusion or pneumothorax.  Bony thorax is intact.  IMPRESSION: No active cardiopulmonary disease.   Electronically Signed   By: Lajean Manes M.D.   On: 10/07/2014 13:18   Medications: I have reviewed the patient's current medications.  Assessment/Plan:  Acute on Chronic CHF Exacerbation: Diuresing well another 2L off yesterday, wegith down to 360 from 376 on admission - Continue Lasix 80 mg IV TID - Continue Coreg 6.25 mg BID  - Continue Aspirin 81 mg daily - Holding ACE because of AKI - 2D Echo - completed this morning, awaitng read - Daily weights, strict I&Os - Oxygen therapy as needed  - Cardiac monitoring  -Cardiology has been consulted, he has never had an ischemic workup, and may benefit from at least a right heart cath if not right and left. -Metabolic alkalossi will give dose of acetazolamide as he still has further diuresis to go  OSA -Continue CPAP  AKI on CKD Stage II: Cr 1.65 on admission, now 1.57 this AM. His baseline appears to be 1.2. Likely due to  acute CHF exacerbation. - Repeat bmet in AM  Hypoalbuminemia: Albumin 2.6 on admission. Within normal range 1 year ago at 3.7. Will check UA to evaluate for proteinuria and possible nephrotic syndrome.  - U/A shows proteinuria, Cr, stable will need to start an ACEi to help with proteinuria.  Will hold off now given aggressive diuresis.   Elevated AST and ALT: AST 54 and ALT 64 on admission. Previously normal 1 year ago. Patient reports only occasional alcohol use. Acute hepatitis panel negative for acute/chronic infection. Suspect fatty liver disease. Will continue to monitor for now. May warrant RUQ [place] in future.  - He will need Hep A and Hep B vaccines as outpatient  HTN: Currently normotensive - Continue Coreg 6.25 mg BID  - Continue Lasix 80 mg IV TID - Hold  lisinopril because of AKI and amlodipine because normotensive currently   Hyperlipidemia: Repeat lipid panel shows Chol 145, Trigly 66, HDL 52, and LDL 80.   - Continue atorvastatin 40 mg daily   Diet: Heart healthy VTE PPx: Lovenox SQ Dispo: Disposition is deferred at this time, awaiting improvement of current medical problems.  Anticipated discharge in approximately 1-2 day(s).   The patient does have a current PCP (Lorayne Marek, MD) and does need an [facility] hospital follow-up appointment after discharge.  Patient wants to follow up with the [facility] clinic  The patient does not have transportation limitations that hinder transportation to clinic appointments.  .Services Needed at time of discharge: Y = Yes, Blank = No PT:   OT:   RN:   Equipment:   Other:     LOS: 2 days   Lucious Groves, DO 10/09/2014, 8:53 AM

## 2014-10-09 NOTE — Progress Notes (Signed)
Subjective:  Doing well denies any chest pain states breathing and leg swelling is gradually improving.  Objective:  Vital Signs in the last 24 hours: Temp:  [97.4 F (36.3 C)-98.2 F (36.8 C)] 97.4 F (36.3 C) (05/15 0512) Pulse Rate:  [63-68] 65 (05/15 0512) Resp:  [18-20] 18 (05/15 0512) BP: (120-135)/(64-88) 120/75 mmHg (05/15 0512) SpO2:  [90 %-92 %] 92 % (05/15 0512) Weight:  [163.703 kg (360 lb 14.4 oz)] 163.703 kg (360 lb 14.4 oz) (05/15 0512)  Intake/Output from previous day: 05/14 0701 - 05/15 0700 In: 1200 [P.O.:1200] Out: 3150 [Urine:3150] Intake/Output from this shift: Total I/O In: 120 [P.O.:120] Out: 800 [Urine:800]  Physical Exam: Neck: no adenopathy, no carotid bruit, no JVD and supple, symmetrical, trachea midline Lungs: clear to auscultation bilaterally Heart: regular rate and rhythm, S1, S2 normal and S3 present Abdomen: soft, non-tender; bowel sounds normal; no masses,  no organomegaly Extremities: No clubbing cyanosis 2+ edema noted  Lab Results:  Recent Labs  10/07/14 1051  WBC 5.6  HGB 14.4  PLT 263    Recent Labs  10/08/14 0253 10/09/14 0936  NA 141 142  K 3.6 3.8  CL 96* 93*  CO2 36* 37*  GLUCOSE 100* 152*  BUN 20 16  CREATININE 1.57* 1.56*    Recent Labs  10/07/14 1051  TROPONINI <0.03   Hepatic Function Panel  Recent Labs  10/07/14 1051  PROT 6.5  ALBUMIN 2.6*  AST 54*  ALT 64*  ALKPHOS 44  BILITOT 0.7    Recent Labs  10/07/14 1547  CHOL 145   No results for input(s): PROTIME in the last 72 hours.  Imaging: Imaging results have been reviewed and Dg Chest 2 View  10/07/2014   CLINICAL DATA:  Short of breath for 1 week  EXAM: CHEST  2 VIEW  COMPARISON:  11/12/2013  FINDINGS: Heart top-normal in size. No mediastinal or hilar masses. Elevated left hemidiaphragm, stable. Clear lungs. No pleural effusion or pneumothorax.  Bony thorax is intact.  IMPRESSION: No active cardiopulmonary disease.   Electronically Signed    By: Lajean Manes M.D.   On: 10/07/2014 13:18    Cardiac Studies:  Assessment/Plan:  Resolving decompensated systolic/diastolic congestive heart failure secondary to noncompliance to medication and diet Hypertensive heart disease with systolic and diastolic dysfunction. Morbid obesity Obstructive sleep apnea/obesity hypoventilation syndrome Chronic kidney disease stage II Metabolic alkalosis secondary to diuretics Plan Continue present management Further workup per  Dr. Doylene Canard   Dr. Doylene Canard to follow in a.m.   LOS: 2 days    Charolette Forward 10/09/2014, 12:39 PM

## 2014-10-10 LAB — BASIC METABOLIC PANEL
ANION GAP: 8 (ref 5–15)
BUN: 17 mg/dL (ref 6–20)
CHLORIDE: 94 mmol/L — AB (ref 101–111)
CO2: 37 mmol/L — ABNORMAL HIGH (ref 22–32)
Calcium: 8.3 mg/dL — ABNORMAL LOW (ref 8.9–10.3)
Creatinine, Ser: 1.43 mg/dL — ABNORMAL HIGH (ref 0.61–1.24)
GFR calc non Af Amer: 55 mL/min — ABNORMAL LOW (ref >60)
Glucose, Bld: 87 mg/dL (ref 65–99)
POTASSIUM: 5.4 mmol/L — AB (ref 3.5–5.1)
Sodium: 139 mmol/L (ref 135–145)

## 2014-10-10 MED ORDER — FUROSEMIDE 10 MG/ML IJ SOLN
80.0000 mg | Freq: Three times a day (TID) | INTRAMUSCULAR | Status: AC
  Administered 2014-10-10 – 2014-10-11 (×6): 80 mg via INTRAVENOUS
  Filled 2014-10-10 (×5): qty 8

## 2014-10-10 MED ORDER — POTASSIUM CHLORIDE CRYS ER 20 MEQ PO TBCR
40.0000 meq | EXTENDED_RELEASE_TABLET | Freq: Two times a day (BID) | ORAL | Status: DC
  Administered 2014-10-10: 40 meq via ORAL
  Filled 2014-10-10: qty 2

## 2014-10-10 NOTE — Progress Notes (Signed)
Pt stated he would place himself on cpap when ready for the night, RT informed pt to call for RT if he needed any help during the night

## 2014-10-10 NOTE — Care Management Note (Signed)
Case Management Note  Patient Details  Name: Adam Dennis MRN: [id number] Date of Birth: June 16, 1960  Subjective/Objective:    Pt admitted on 10/07/14 with CHF exacerbation.  PTA, pt independent, lives alone.  PCP is Dr. Doreene Burke at [facility].  Pt states he has problems affording his medications.               Action/Plan:  Met with pt and brother to discuss possible resources for medications.  Pt states he is in process of applying for [employer] [facility] presently.  He has applied for Medicaid over a year ago, but was denied.  Encouraged pt to have Rx filled at [facility], where all meds are $4 and $10, and to follow through with [facility] application.  Financial Counselor arrived while I was talking with pt to discuss financial assistance, and plans for Medicaid application for May 18.  Pt and brother appreciative of help.  Will check for [facility] eligibility.  If able, will provide MATCH letter at dc.   Expected Discharge Date:                  Expected Discharge Plan:  Home/Self Care  In-House Referral:     Discharge planning Services  CM Consult, Medication Assistance  Post Acute Care Choice:    Choice offered to:     DME Arranged:    DME Agency:     HH Arranged:    HH Agency:     Status of Service:  In process, will continue to follow  Medicare Important Message Given:    Date Medicare IM Given:    Medicare IM give by:    Date Additional Medicare IM Given:    Additional Medicare Important Message give by:     If discussed at [place] of Stay Meetings, dates discussed:    Additional Comments:  Ella Bodo, RN 10/10/2014, 2:35 PM Phone [phone]

## 2014-10-10 NOTE — Progress Notes (Signed)
Ref: Lorayne Marek, MD   Subjective:  Feeling better. Fair diuresis over 2 days. Afebrile.  Objective:  Vital Signs in the last 24 hours: Temp:  [97.7 F (36.5 C)-98.6 F (37 C)] 98.6 F (37 C) (05/16 2109) Pulse Rate:  [61-71] 65 (05/16 2109) Cardiac Rhythm:  [-]  Resp:  [18] 18 (05/16 2109) BP: (102-142)/(60-83) 102/60 mmHg (05/16 2109) SpO2:  [90 %-98 %] 92 % (05/16 2109) Weight:  [162.524 kg (358 lb 4.8 oz)] 162.524 kg (358 lb 4.8 oz) (05/16 0604)  Physical Exam: BP Readings from Last 1 Encounters:  10/10/14 102/60    Wt Readings from Last 1 Encounters:  10/10/14 162.524 kg (358 lb 4.8 oz)    Weight change: -1.179 kg (-2 lb 9.6 oz)  HEENT: [place]/AT, Eyes-Brown, PERL, EOMI, Conjunctiva-Pink, Sclera-Non-icteric Neck: No JVD, No bruit, Trachea midline. Lungs:  Clearing, Bilateral. Cardiac:  Regular rhythm, normal S1 and S2, no S3.  Abdomen:  Soft, non-tender. Extremities:  2 + edema present. No cyanosis. No clubbing. CNS: AxOx3, Cranial nerves grossly intact, moves all 4 extremities. Right handed. Skin: Warm and dry.   Intake/Output from previous day: 05/15 0701 - 05/16 0700 In: 1020 [P.O.:1020] Out: 1700 [Urine:1700]    Lab Results: BMET    Component Value Date/Time   NA 139 10/10/2014 1610   NA 142 10/09/2014 0936   NA 141 10/08/2014 0253   K 5.4* 10/10/2014 1610   K 3.8 10/09/2014 0936   K 3.6 10/08/2014 0253   CL 94* 10/10/2014 1610   CL 93* 10/09/2014 0936   CL 96* 10/08/2014 0253   CO2 37* 10/10/2014 1610   CO2 37* 10/09/2014 0936   CO2 36* 10/08/2014 0253   GLUCOSE 87 10/10/2014 1610   GLUCOSE 152* 10/09/2014 0936   GLUCOSE 100* 10/08/2014 0253   BUN 17 10/10/2014 1610   BUN 16 10/09/2014 0936   BUN 20 10/08/2014 0253   CREATININE 1.43* 10/10/2014 1610   CREATININE 1.56* 10/09/2014 0936   CREATININE 1.57* 10/08/2014 0253   CREATININE 1.23 07/27/2013 0909   CALCIUM 8.3* 10/10/2014 1610   CALCIUM 8.2* 10/09/2014 0936   CALCIUM 8.3* 10/08/2014  0253   GFRNONAA 55* 10/10/2014 1610   GFRNONAA 49* 10/09/2014 0936   GFRNONAA 49* 10/08/2014 0253   GFRNONAA 68 07/27/2013 0909   GFRAA >60 10/10/2014 1610   GFRAA 57* 10/09/2014 0936   GFRAA 57* 10/08/2014 0253   GFRAA 78 07/27/2013 0909   CBC    Component Value Date/Time   WBC 5.6 10/07/2014 1051   RBC 5.65 10/07/2014 1051   HGB 14.4 10/07/2014 1051   HCT 47.4 10/07/2014 1051   PLT 263 10/07/2014 1051   MCV 83.9 10/07/2014 1051   MCH 25.5* 10/07/2014 1051   MCHC 30.4 10/07/2014 1051   RDW 17.0* 10/07/2014 1051   LYMPHSABS 1.5 10/07/2014 1051   MONOABS 1.0 10/07/2014 1051   EOSABS 0.0 10/07/2014 1051   BASOSABS 0.0 10/07/2014 1051   HEPATIC Function Panel  Recent Labs  10/07/14 1051  PROT 6.5   HEMOGLOBIN A1C No components found for: HGA1C,  MPG CARDIAC ENZYMES Lab Results  Component Value Date   TROPONINI <0.03 10/07/2014   TROPONINI <0.30 10/17/2012   BNP  Recent Labs  11/12/13 0953  PROBNP 227.1*   TSH No results for input(s): TSH in the last 8760 hours. CHOLESTEROL  Recent Labs  10/07/14 1547  CHOL 145    Scheduled Meds: . aspirin EC  81 mg Oral Daily  . carvedilol  6.25 mg Oral BID WC  . enoxaparin (LOVENOX) injection  85 mg Subcutaneous Q24H  . furosemide  80 mg Intravenous TID  . 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 Morbid obesity Obstructive sleep apnea/obesity hypoventilation syndrome Chronic kidney disease stage II Metabolic alkalosis secondary to diuretics  Plan: Consider right and left heart cath if patient agrees.     LOS: 3 days    Dixie Dials  MD  10/10/2014, 9:27 PM

## 2014-10-10 NOTE — Progress Notes (Signed)
Subjective: No acute events overnight. Patient diuresing well, net -4.3 L. Did not receive Lasix yesterday afternoon which explains lower output. Weight down 18 lbs.   Objective: Vital signs in last 24 hours: Filed Vitals:   10/09/14 1400 10/09/14 2110 10/10/14 0604 10/10/14 0806  BP: 92/36 92/37 136/83 131/74  Pulse: 68 64 65 71  Temp: 97.1 F (36.2 C) 98.2 F (36.8 C) 98.1 F (36.7 C)   TempSrc: Oral Oral Oral   Resp: 18 18 18    Height:      Weight:   358 lb 4.8 oz (162.524 kg)   SpO2: 98% 91% 90%    Weight change: -2 lb 9.6 oz (-1.179 kg)  Intake/Output Summary (Last 24 hours) at 10/10/14 1109 Last data filed at 10/10/14 0813  Gross per 24 hour  Intake    990 ml  Output    900 ml  Net     90 ml   Physical Exam General: sitting up in chair, pleasant, NAD HEENT: PERRL, EOMI, no scleral icterus Cardiac: RRR, no m/g/r Pulm: no respiratory distress, CTA bilaterally  Abd: BS+, soft, obese, nontender, nondistended Ext: warm and well perfused, 1+ peripheral edema Neuro: alert and oriented X 3  Lab Results: Basic Metabolic Panel:  Recent Labs Lab 10/08/14 0253 10/09/14 0936  NA 141 142  K 3.6 3.8  CL 96* 93*  CO2 36* 37*  GLUCOSE 100* 152*  BUN 20 16  CREATININE 1.57* 1.56*  CALCIUM 8.3* 8.2*   Liver Function Tests:  Recent Labs Lab 10/07/14 1051  AST 54*  ALT 64*  ALKPHOS 44  BILITOT 0.7  PROT 6.5  ALBUMIN 2.6*   CBC:  Recent Labs Lab 10/07/14 1051  WBC 5.6  NEUTROABS 3.0  HGB 14.4  HCT 47.4  MCV 83.9  PLT 263   Cardiac Enzymes:  Recent Labs Lab 10/07/14 1051  TROPONINI <0.03   CBG:  Recent Labs Lab 10/08/14 0603  GLUCAP 107*   Hemoglobin A1C:  Recent Labs Lab 10/07/14 1547  HGBA1C 6.7*   Fasting Lipid Panel:  Recent Labs Lab 10/07/14 1547  CHOL 145  HDL 52  LDLCALC 80  TRIG 66  CHOLHDL 2.8   Urinalysis:  Recent Labs Lab 10/09/14 0900  COLORURINE YELLOW  LABSPEC 1.023  PHURINE 7.5  GLUCOSEU NEGATIVE    HGBUR NEGATIVE  BILIRUBINUR NEGATIVE  KETONESUR NEGATIVE  PROTEINUR >300*  UROBILINOGEN 1.0  NITRITE NEGATIVE  LEUKOCYTESUR NEGATIVE   Medications: I have reviewed the patient's current medications.  Assessment/Plan:  Acute on Chronic CHF Exacerbation: Secondary to medication noncompliance and dietary indiscretion. Diuresing well. Net 4.3 L off and down 18 lbs total. Echo with stable EF 123456, grade 1 diastolic dysfunction, unable to assess pulmonary artery pressures.  - Cardiology following, appreciate recommendations - He has never had an ischemic workup and may benefit from at least a right heart cath if not right and left. Will leave up to cards to decide.  - Continue Lasix 80 mg IV TID - Continue Coreg 6.25 mg BID  - Continue Aspirin 81 mg daily - Holding ACE because of AKI - Daily weights, strict I&Os - Oxygen therapy as needed  - Cardiac monitoring  - Case management consulted for medication needs  AKI on CKD Stage II: Cr 1.65 on admission, now 1.56. His baseline appears to be 1.2. Likely due to acute CHF exacerbation. - Repeat bmet in AM  Hypoalbuminemia: Albumin 2.6 on admission. Within normal range 1 year ago at 3.7. U/A shows  proteinuria and Cr stable. Patient on ACE at home, but has not been taking due to being unable to afford medications.  Will hold off on starting ACE right now due to AKI. - Restart ACE once AKI resolving   OSA:  -Continue CPAP  Elevated AST and ALT: AST 54 and ALT 64 on admission. Previously normal 1 year ago. Patient reports only occasional alcohol use. Acute hepatitis panel negative for acute/chronic infection. Suspect fatty liver disease. Will continue to monitor for now. May warrant RUQ [place] in future.  - He will need Hep A and Hep B vaccines as outpatient  HTN: Currently normotensive - Continue Coreg 6.25 mg BID  - Continue Lasix 80 mg IV TID - Hold lisinopril because of AKI and amlodipine because normotensive currently    Hyperlipidemia: Repeat lipid panel shows Chol 145, Trigly 66, HDL 52, and LDL 80.  - Continue atorvastatin 40 mg daily   Diet: Heart healthy VTE PPx: Lovenox SQ Dispo: Disposition is deferred at this time, awaiting improvement of current medical problems.  Anticipated discharge in approximately 1-2 day(s).   The patient does have a current PCP (Lorayne Marek, MD) and does need an [facility] hospital follow-up appointment after discharge.  The patient does not have transportation limitations that hinder transportation to clinic appointments.  .Services Needed at time of discharge: Y = Yes, Blank = No PT:   OT:   RN:   Equipment:   Other:     LOS: 3 days   Juliet Rude, MD 10/10/2014, 11:09 AM

## 2014-10-10 NOTE — Progress Notes (Signed)
Nutrition Education Note  RD consulted for Assessment of Nutrition Status/Recommendations.   Pt reports that he was eating well PTA; follows a low sodium/no added salt diet. Reports eating mostly baked chicken, vegetables,and rice; admits to eating bologna and hot dogs occasionally. Reports getting tired of eating the same thing.  RD provided "Heart Failure Nutrition Therapy" handout from the [employer]. Reviewed patient's dietary recall. Provided examples on ways to decrease sodium intake in diet. Discouraged intake of processed foods. Encouraged fresh fruits and vegetables as well as whole grain sources of carbohydrates to maximize fiber intake. Provided list of recommended foods.  Briefly discussed patient's elevated hemoglobin A1C. Discussed the benefits of weight loss and avoiding sugary foods/beverages on lowering blood glucose levels.   RD discussed why it is important for patient to adhere to diet recommendations, and emphasized the role of fluids, foods to avoid, and importance of weighing self daily. Teach back method used.  Expect good compliance.  Body mass index is 49.99 kg/(m^2). Pt meets criteria for Morbid Obesity based on current BMI.  Current diet order is Heart Healthy, patient is consuming approximately 100% of meals at this time. Labs and medications reviewed. No further nutrition interventions warranted at this time. RD contact information provided. If additional nutrition issues arise, please re-consult RD.   Pryor Ochoa RD, LDN Inpatient Clinical Dietitian Pager: [phone] After Hours Pager: [phone]

## 2014-10-10 NOTE — Progress Notes (Signed)
Patient self administered CPAP. Patient is sleeping and tolerating well. RT will continue to monitor as needed.

## 2014-10-11 LAB — CBC
HCT: 47.7 % (ref 39.0–52.0)
Hemoglobin: 14.7 g/dL (ref 13.0–17.0)
MCH: 25.6 pg — ABNORMAL LOW (ref 26.0–34.0)
MCHC: 30.8 g/dL (ref 30.0–36.0)
MCV: 83.1 fL (ref 78.0–100.0)
PLATELETS: 255 10*3/uL (ref 150–400)
RBC: 5.74 MIL/uL (ref 4.22–5.81)
RDW: 16.8 % — AB (ref 11.5–15.5)
WBC: 5.9 10*3/uL (ref 4.0–10.5)

## 2014-10-11 LAB — BASIC METABOLIC PANEL
ANION GAP: 9 (ref 5–15)
BUN: 18 mg/dL (ref 6–20)
CO2: 37 mmol/L — ABNORMAL HIGH (ref 22–32)
Calcium: 8.1 mg/dL — ABNORMAL LOW (ref 8.9–10.3)
Chloride: 93 mmol/L — ABNORMAL LOW (ref 101–111)
Creatinine, Ser: 1.5 mg/dL — ABNORMAL HIGH (ref 0.61–1.24)
GFR calc non Af Amer: 52 mL/min — ABNORMAL LOW (ref >60)
Glucose, Bld: 104 mg/dL — ABNORMAL HIGH (ref 65–99)
POTASSIUM: 4 mmol/L (ref 3.5–5.1)
Sodium: 139 mmol/L (ref 135–145)

## 2014-10-11 MED ORDER — ENOXAPARIN SODIUM 80 MG/0.8ML ~~LOC~~ SOLN
80.0000 mg | SUBCUTANEOUS | Status: DC
  Administered 2014-10-11: 80 mg via SUBCUTANEOUS
  Filled 2014-10-11 (×2): qty 0.8

## 2014-10-11 MED ORDER — ACETAZOLAMIDE SODIUM 500 MG IJ SOLR
500.0000 mg | Freq: Once | INTRAMUSCULAR | Status: AC
  Administered 2014-10-11: 500 mg via INTRAVENOUS
  Filled 2014-10-11: qty 500

## 2014-10-11 MED ORDER — LISINOPRIL 40 MG PO TABS
40.0000 mg | ORAL_TABLET | Freq: Every day | ORAL | Status: DC
  Administered 2014-10-12: 40 mg via ORAL
  Filled 2014-10-11: qty 1

## 2014-10-11 NOTE — Progress Notes (Signed)
Subjective: No acute events overnight. Patient doing well this morning. Continues to diurese well with another 2 L off, weight down to 352.   Objective: Vital signs in last 24 hours: Filed Vitals:   10/10/14 1500 10/10/14 2109 10/11/14 0611 10/11/14 0616  BP: 142/78 102/60  93/73  Pulse: 61 65  75  Temp: 97.7 F (36.5 C) 98.6 F (37 C)    TempSrc: Oral Oral    Resp: 18 18  16   Height:      Weight:   352 lb 9.6 oz (159.938 kg)   SpO2: 98% 92%  93%   Weight change: -5 lb 11.2 oz (-2.586 kg)  Intake/Output Summary (Last 24 hours) at 10/11/14 1103 Last data filed at 10/11/14 0942  Gross per 24 hour  Intake   1083 ml  Output   2925 ml  Net  -1842 ml   Physical Exam  General: sitting up in bed, pleasant, NAD HEENT: PERRL, EOMI, no scleral icterus Cardiac: RRR, no m/g/r Pulm: no respiratory distress, CTA bilaterally  Abd: BS+, soft, obese, nontender, nondistended Ext: warm and well perfused, 1+ peripheral edema Neuro: alert and oriented X 3  Lab Results: Basic Metabolic Panel:  Recent Labs Lab 10/10/14 1610 10/11/14 0255  NA 139 139  K 5.4* 4.0  CL 94* 93*  CO2 37* 37*  GLUCOSE 87 104*  BUN 17 18  CREATININE 1.43* 1.50*  CALCIUM 8.3* 8.1*   Liver Function Tests:  Recent Labs Lab 10/07/14 1051  AST 54*  ALT 64*  ALKPHOS 44  BILITOT 0.7  PROT 6.5  ALBUMIN 2.6*   CBC:  Recent Labs Lab 10/07/14 1051 10/11/14 0255  WBC 5.6 5.9  NEUTROABS 3.0  --   HGB 14.4 14.7  HCT 47.4 47.7  MCV 83.9 83.1  PLT 263 255   Cardiac Enzymes:  Recent Labs Lab 10/07/14 1051  TROPONINI <0.03   CBG:  Recent Labs Lab 10/08/14 0603  GLUCAP 107*   Hemoglobin A1C:  Recent Labs Lab 10/07/14 1547  HGBA1C 6.7*   Fasting Lipid Panel:  Recent Labs Lab 10/07/14 1547  CHOL 145  HDL 52  LDLCALC 80  TRIG 66  CHOLHDL 2.8   Medications: I have reviewed the patient's current medications.  Assessment/Plan:  Acute on Chronic CHF Exacerbation: Secondary  to medication noncompliance and dietary indiscretion. Diuresing well. Net 6.2 L off and down 24 lbs total. Echo with stable EF 123456, grade 1 diastolic dysfunction, unable to assess pulmonary artery pressures. Discussed possibility of cardiac cath and patient agreeable. Will await for cardiology's decision.  - Cardiology following, appreciate recommendations - He has never had an ischemic workup and may benefit from at least a right heart cath if not right and left. Will leave up to cards to decide.  - Continue Lasix 80 mg IV TID - Continue Coreg 6.25 mg BID  - Continue Aspirin 81 mg daily - Holding ACE because of AKI - Daily weights, strict I&Os - Oxygen therapy as needed  - Cardiac monitoring  - Case management consulted for medication needs  Metabolic Alkalosis: Secondary to diuretics. Bicarb 37. Will give one dose of acetazolamide today. - Acetazolamide 500 mg IV once  AKI on CKD Stage II: Cr 1.65 on admission, now 1.50. His baseline appears to be 1.2. Likely due to acute CHF exacerbation. - Repeat bmet in AM  Hypoalbuminemia: Albumin 2.6 on admission. Within normal range 1 year ago at 3.7. U/A shows proteinuria and Cr stable. Patient on ACE at  home, but has not been taking due to being unable to afford medications. Will hold off on starting ACE right now due to AKI. - Restart ACE once AKI resolving   OSA:  -Continue CPAP  Elevated AST and ALT: AST 54 and ALT 64 on admission. Previously normal 1 year ago. Patient reports only occasional alcohol use. Acute hepatitis panel negative for acute/chronic infection. Suspect fatty liver disease. Will continue to monitor for now. May warrant RUQ [place] in future.  - He will need Hep A and Hep B vaccines as outpatient  HTN: Currently normotensive - Continue Coreg 6.25 mg BID  - Continue Lasix 80 mg IV TID - Hold lisinopril because of AKI and amlodipine because normotensive currently   Hyperlipidemia: Repeat lipid panel shows Chol 145,  Trigly 66, HDL 52, and LDL 80.  - Continue atorvastatin 40 mg daily   Diet: Heart healthy VTE PPx: Lovenox SQ Dispo: Disposition is deferred at this time, awaiting improvement of current medical problems.  Anticipated discharge in approximately 1-2 day(s). Awaiting to see if he needs cardiac cath.   The patient does have a current PCP (Lorayne Marek, MD) and does need an [facility] hospital follow-up appointment after discharge.  The patient does not have transportation limitations that hinder transportation to clinic appointments.  .Services Needed at time of discharge: Y = Yes, Blank = No PT:   OT:   RN:   Equipment:   Other:     LOS: 4 days   Albin Felling, MD IMTS PGY-1 Pager: [phone]

## 2014-10-11 NOTE — Progress Notes (Signed)
Ref: Lorayne Marek, MD   Subjective:  Feeling little better daily. Accepts heart catheterization but wants to wait for a month and get below 350 pounds of weight.  Objective:  Vital Signs in the last 24 hours: Temp:  [97.7 F (36.5 C)-98.6 F (37 C)] 98 F (36.7 C) (05/17 1117) Pulse Rate:  [61-75] 69 (05/17 1117) Cardiac Rhythm:  [-]  Resp:  [16-18] 16 (05/17 1117) BP: (93-142)/(60-78) 126/60 mmHg (05/17 1117) SpO2:  [92 %-100 %] 100 % (05/17 1117) Weight:  [159.938 kg (352 lb 9.6 oz)] 159.938 kg (352 lb 9.6 oz) (05/17 [id number])  Physical Exam: BP Readings from Last 1 Encounters:  10/11/14 126/60    Wt Readings from Last 1 Encounters:  10/11/14 159.938 kg (352 lb 9.6 oz)    Weight change: -2.586 kg (-5 lb 11.2 oz)  HEENT: [place]/AT, Eyes-Brown, PERL, EOMI, Conjunctiva-Pink, Sclera-Non-icteric Neck: No JVD, No bruit, Trachea midline. Lungs:  Clear, Bilateral. Cardiac:  Regular rhythm, normal S1 and S2, no S3.  Abdomen:  Soft, non-tender. Extremities:  1 + edema present. No cyanosis. No clubbing. CNS: AxOx3, Cranial nerves grossly intact, moves all 4 extremities. Right handed. Skin: Warm and dry.   Intake/Output from previous day: 05/16 0701 - 05/17 0700 In: 723 [P.O.:720; I.V.:3] Out: 2525 [Urine:2525]    Lab Results: BMET    Component Value Date/Time   NA 139 10/11/2014 0255   NA 139 10/10/2014 1610   NA 142 10/09/2014 0936   K 4.0 10/11/2014 0255   K 5.4* 10/10/2014 1610   K 3.8 10/09/2014 0936   CL 93* 10/11/2014 0255   CL 94* 10/10/2014 1610   CL 93* 10/09/2014 0936   CO2 37* 10/11/2014 0255   CO2 37* 10/10/2014 1610   CO2 37* 10/09/2014 0936   GLUCOSE 104* 10/11/2014 0255   GLUCOSE 87 10/10/2014 1610   GLUCOSE 152* 10/09/2014 0936   BUN 18 10/11/2014 0255   BUN 17 10/10/2014 1610   BUN 16 10/09/2014 0936   CREATININE 1.50* 10/11/2014 0255   CREATININE 1.43* 10/10/2014 1610   CREATININE 1.56* 10/09/2014 0936   CREATININE 1.23 07/27/2013 0909   CALCIUM  8.1* 10/11/2014 0255   CALCIUM 8.3* 10/10/2014 1610   CALCIUM 8.2* 10/09/2014 0936   GFRNONAA 52* 10/11/2014 0255   GFRNONAA 55* 10/10/2014 1610   GFRNONAA 49* 10/09/2014 0936   GFRNONAA 68 07/27/2013 0909   GFRAA >60 10/11/2014 0255   GFRAA >60 10/10/2014 1610   GFRAA 57* 10/09/2014 0936   GFRAA 78 07/27/2013 0909   CBC    Component Value Date/Time   WBC 5.9 10/11/2014 0255   RBC 5.74 10/11/2014 0255   HGB 14.7 10/11/2014 0255   HCT 47.7 10/11/2014 0255   PLT 255 10/11/2014 0255   MCV 83.1 10/11/2014 0255   MCH 25.6* 10/11/2014 0255   MCHC 30.8 10/11/2014 0255   RDW 16.8* 10/11/2014 0255   LYMPHSABS 1.5 10/07/2014 1051   MONOABS 1.0 10/07/2014 1051   EOSABS 0.0 10/07/2014 1051   BASOSABS 0.0 10/07/2014 1051   HEPATIC Function Panel  Recent Labs  10/07/14 1051  PROT 6.5   HEMOGLOBIN A1C No components found for: HGA1C,  MPG CARDIAC ENZYMES Lab Results  Component Value Date   TROPONINI <0.03 10/07/2014   TROPONINI <0.30 10/17/2012   BNP  Recent Labs  11/12/13 0953  PROBNP 227.1*   TSH No results for input(s): TSH in the last 8760 hours. CHOLESTEROL  Recent Labs  10/07/14 1547  CHOL 145  Scheduled Meds: . acetaZOLAMIDE  500 mg Intravenous Once  . aspirin EC  81 mg Oral Daily  . carvedilol  6.25 mg Oral BID WC  . enoxaparin (LOVENOX) injection  80 mg Subcutaneous Q24H  . furosemide  80 mg Intravenous TID  . 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 Morbid obesity Obstructive sleep apnea/obesity hypoventilation syndrome Chronic kidney disease stage II Metabolic alkalosis secondary to diuretics  Continue diuresis as tolerated, increase activity, f/u in 2 weeks, R +L heart  cath in 1-2 months.    LOS: 4 days    Dixie Dials  MD  10/11/2014, 1:19 PM

## 2014-10-11 NOTE — Progress Notes (Signed)
Heart Failure Navigator Consult Note  Presentation: Adam Dennis is 54 year old male with past medical history significant for hypertensive heart disease with systolic and diastolic dysfunction, history of recurrent congestive heart failure secondary to systolic and diastolic dysfunction, hypertension, morbid obesity, obstructive sleep apnea/obesity hypoventilation syndrome on CPAP at home, chronic kidney disease stage II, was admitted yesterday because of progressive increasing shortness of breath associated with PND orthopnea and leg swelling for last few days. Patient states he has been noncompliant to medication as he cannot afford to buy them also noncompliant to diet. Denies any chest pain nausea vomiting diaphoresis denies any palpitation lightheadedness or syncope. Patient received IV Lasix with improvement in his symptoms. Patient presently denies any chest pain states his breathing and leg swelling is gradually improving. Denies any fever or chills. Denies any history of EtOH abuse. Denies any thyroid problems   Past Medical History  Diagnosis Date  . Hypertrophy of tonsils alone   . Chronic combined systolic and diastolic heart failure   . Obesity, unspecified   . Obstructive sleep apnea (adult) (pediatric)   . Hypertension   . CHF (congestive heart failure)     History   Social History  . Marital Status: Single    Spouse Name: N/A  . Number of Children: 0  . Years of Education: N/A   Occupational History  . works as a Garment/textile technologist History Main Topics  . Smoking status: Never Smoker   . Smokeless tobacco: Never Used  . Alcohol Use: No  . Drug Use: No  . Sexual Activity: Not on file   Other Topics Concern  . None   Social History Narrative    ECHO:Study Conclusions--10/08/14  - Left ventricle: There was mild concentric hypertrophy. Systolic function was moderately reduced. The estimated ejection fraction was in the range of 35% to 40%. There is severe  hypokinesis of the apicalinferior myocardium. Doppler parameters are consistent with abnormal left ventricular relaxation (grade 1 diastolic dysfunction). - Ventricular septum: Septal motion showed paradox. - Left atrium: The atrium was mildly dilated.  Transthoracic echocardiography. M-mode, complete 2D, spectral Doppler, and color Doppler. Birthdate: Patient birthdate: May 12, 1961. Age: Patient is 54 yr old. Sex: Gender: male. BMI: 51.6 kg/m^2. Blood pressure:   127/83 Patient status: Inpatient. Study date: Study date: 10/08/2014. Study time: 01:04 PM. Location: Bedside.  BNP    Component Value Date/Time   BNP 574.2* 10/07/2014 1051    ProBNP    Component Value Date/Time   PROBNP 227.1* 11/12/2013 0953     Education Assessment and Provision:  Detailed education and instructions provided on heart failure disease management including the following:  Signs and symptoms of Heart Failure When to call the physician Importance of daily weights Low sodium diet Fluid restriction Medication management Anticipated future follow-up appointments  Patient education given on each of the above topics.  Patient acknowledges understanding and acceptance of all instructions.  I spoke at length with Mr. Timperley regarding his HF and HF recommendations.  He is able to teach back topics listed above.  He does admit that he eats hotdogs and bologna due to his finances. I reinforced the importance of a low sodium diet and high sodium foods to avoid.  He has a scale and admits that he does not weigh daily.  We discussed the importance of daily weights and how to use them as a tool in relation to signs and symptoms of HF.  He tells me that this hospitalization is due to "  not taking my meds".  He admits that he runs out of meds and sometimes only takes 2 because he cannot "afford them all".  I reinforced how all of the medications are important and work together.  I will coordinate  with Care Manager for a plan for affording his medications ongoing.  Education Materials:  "Living Better With Heart Failure" Booklet, Daily Weight Tracker Tool and Heart Failure Educational Video.   High Risk Criteria for Readmission and/or Poor Patient Outcomes:  (Recommend Follow-up with Advanced [facility] Failure Clinic)--Yes he would benefit from AHF clinic follow-up.   EF <30%- No-35-40%  2 or more admissions in 6 months- No  Difficult social situation- No--lives with his girlfriend  Demonstrates medication noncompliance- Yes--"not working enough" to afford medications    Barriers of Care:  Knowledge and compliance--finances  Discharge Planning:   Plans to return to home with girlfriend.  I will coordinate with Care Management to find resources to assist with ongoing difficulties affording medications.

## 2014-10-11 NOTE — Plan of Care (Signed)
Problem: Phase I Progression Outcomes Goal: EF % per last Echo/documented,Core Reminder form on chart Outcome: Completed/Met Date Met:  10/11/14 EF performed on 10/07/2013 EF% result is 35-40%

## 2014-10-11 NOTE — Progress Notes (Signed)
Patient sitting on side of bed. Night medications given as ordered. Pt has no complaints of pain, dyspnea, or discomfort. Will continue to monitor patient to end of shift.

## 2014-10-12 LAB — BASIC METABOLIC PANEL
Anion gap: 9 (ref 5–15)
BUN: 22 mg/dL — ABNORMAL HIGH (ref 6–20)
CO2: 35 mmol/L — ABNORMAL HIGH (ref 22–32)
Calcium: 8.1 mg/dL — ABNORMAL LOW (ref 8.9–10.3)
Chloride: 95 mmol/L — ABNORMAL LOW (ref 101–111)
Creatinine, Ser: 1.59 mg/dL — ABNORMAL HIGH (ref 0.61–1.24)
GFR calc Af Amer: 56 mL/min — ABNORMAL LOW (ref >60)
GFR calc non Af Amer: 48 mL/min — ABNORMAL LOW (ref >60)
Glucose, Bld: 107 mg/dL — ABNORMAL HIGH (ref 65–99)
Potassium: 3.9 mmol/L (ref 3.5–5.1)
Sodium: 139 mmol/L (ref 135–145)

## 2014-10-12 MED ORDER — CARVEDILOL 6.25 MG PO TABS: 6.2500 mg | ORAL_TABLET | Freq: Two times a day (BID) | ORAL | Status: DC

## 2014-10-12 MED ORDER — FUROSEMIDE 40 MG PO TABS: 40.0000 mg | ORAL_TABLET | Freq: Two times a day (BID) | ORAL | Status: DC

## 2014-10-12 MED ORDER — FUROSEMIDE 40 MG PO TABS
40.0000 mg | ORAL_TABLET | Freq: Two times a day (BID) | ORAL | Status: DC
  Administered 2014-10-12: 40 mg via ORAL
  Filled 2014-10-12 (×3): qty 1

## 2014-10-12 MED ORDER — POTASSIUM CHLORIDE CRYS ER 20 MEQ PO TBCR: 20.0000 meq | EXTENDED_RELEASE_TABLET | Freq: Every day | ORAL | Status: DC

## 2014-10-12 MED ORDER — AMLODIPINE BESYLATE 10 MG PO TABS: 10.0000 mg | ORAL_TABLET | Freq: Every day | ORAL | Status: DC

## 2014-10-12 MED ORDER — POTASSIUM CHLORIDE CRYS ER 20 MEQ PO TBCR
40.0000 meq | EXTENDED_RELEASE_TABLET | Freq: Once | ORAL | Status: AC
  Administered 2014-10-12: 40 meq via ORAL
  Filled 2014-10-12: qty 2

## 2014-10-12 MED ORDER — LISINOPRIL 40 MG PO TABS: 40.0000 mg | ORAL_TABLET | Freq: Every day | ORAL | Status: DC

## 2014-10-12 MED ORDER — ATORVASTATIN CALCIUM 40 MG PO TABS: 40.0000 mg | ORAL_TABLET | Freq: Every day | ORAL | Status: DC

## 2014-10-12 NOTE — Discharge Instructions (Signed)
It was a pleasure taking care of you, Mr. Crittenden.  - Take Lasix 40 mg twice a day until you see your doctor on Friday, May 20th - Take Potassium chloride 20 mEq daily - Take Coreg 6.25 mg twice a day. STOP atenolol.  - Take your other medications as prescribed.  - Please pick up your prescriptions at the [facility] - Please make it to your follow up appointment with Dr. Annitta Needs on Friday, May 20th   Take care, Dr. Arcelia Jew

## 2014-10-12 NOTE — Progress Notes (Signed)
Pt is being discharged home. Pt has been provided with discharge instructions. RN answered all questions the patient had. Pt is being discharged home by his family

## 2014-10-12 NOTE — Progress Notes (Signed)
Subjective: Patient with no complaints this morning. He did not diurese as well overnight as he did not receive Lasix dose. He is still net -5.2 L. His dyspnea is much improved and able to ambulate hallways without becoming SOB. Cards to do heart cath in next 1-2 months after patient hopefully loses some more weight.   Objective: Vital signs in last 24 hours: Filed Vitals:   10/11/14 1710 10/11/14 2131 10/12/14 0723 10/12/14 0728  BP: 115/62 107/58  105/60  Pulse:  72  76  Temp:  97.7 F (36.5 C)  97.3 F (36.3 C)  TempSrc:  Oral  Oral  Resp:  17  18  Height:      Weight:   351 lb 9.6 oz (159.485 kg)   SpO2:  93%  94%   Weight change:   Intake/Output Summary (Last 24 hours) at 10/12/14 1030 Last data filed at 10/12/14 0758  Gross per 24 hour  Intake   1320 ml  Output    325 ml  Net    995 ml   Physical Exam General: sitting up in bed, pleasant, NAD HEENT: PERRL, EOMI, no scleral icterus Cardiac: RRR, no m/g/r Pulm: no respiratory distress, CTA bilaterally  Abd: BS+, soft, obese, nontender, nondistended Ext: warm and well perfused, trace peripheral edema Neuro: alert and oriented X 3  Lab Results: Basic Metabolic Panel:  Recent Labs Lab 10/11/14 0255 10/12/14 0348  NA 139 139  K 4.0 3.9  CL 93* 95*  CO2 37* 35*  GLUCOSE 104* 107*  BUN 18 22*  CREATININE 1.50* 1.59*  CALCIUM 8.1* 8.1*   Liver Function Tests:  Recent Labs Lab 10/07/14 1051  AST 54*  ALT 64*  ALKPHOS 44  BILITOT 0.7  PROT 6.5  ALBUMIN 2.6*   CBC:  Recent Labs Lab 10/07/14 1051 10/11/14 0255  WBC 5.6 5.9  NEUTROABS 3.0  --   HGB 14.4 14.7  HCT 47.4 47.7  MCV 83.9 83.1  PLT 263 255   Cardiac Enzymes:  Recent Labs Lab 10/07/14 1051  TROPONINI <0.03   CBG:  Recent Labs Lab 10/08/14 0603  GLUCAP 107*   Hemoglobin A1C:  Recent Labs Lab 10/07/14 1547  HGBA1C 6.7*   Fasting Lipid Panel:  Recent Labs Lab 10/07/14 1547  CHOL 145  HDL 52  LDLCALC 80  TRIG  66  CHOLHDL 2.8   Urinalysis:  Recent Labs Lab 10/09/14 0900  COLORURINE YELLOW  LABSPEC 1.023  PHURINE 7.5  GLUCOSEU NEGATIVE  HGBUR NEGATIVE  BILIRUBINUR NEGATIVE  KETONESUR NEGATIVE  PROTEINUR >300*  UROBILINOGEN 1.0  NITRITE NEGATIVE  LEUKOCYTESUR NEGATIVE   Medications: I have reviewed the patient's current medications.  Assessment/Plan:  Acute on Chronic CHF Exacerbation: Secondary to medication noncompliance and dietary indiscretion. Diuresing well. Net 5.2 L off and down 25 lbs total. Echo with stable EF 123456, grade 1 diastolic dysfunction, unable to assess pulmonary artery pressures. Cardiology planning to do heart cath in 1-2 months once patient's weight is down.  - Cardiology following, appreciate recommendations - Discharge on Lasix 40 mg PO BID - Lisinopril 40 mg daily restarted today - Continue Coreg 6.25 mg BID  - Continue Aspirin 81 mg daily - Daily weights, strict I&Os - Oxygen therapy as needed  - Cardiac monitoring  - Case management consulted for medication needs>> Discussed with patient that he can get meds for $4 at [facility] pharmacy  Metabolic Alkalosis: Secondary to diuretics. Bicarb 35. Will need repeat bmet at outpatient visit.  AKI on CKD Stage II: Cr 1.65 on admission, now 1.59. His baseline appears to be 1.2. Likely due to acute CHF exacerbation. - He will need a repeat bmet at his outpatient follow up visit on Friday  Hypoalbuminemia: Albumin 2.6 on admission. Within normal range 1 year ago at 3.7. U/A shows proteinuria and Cr stable. Patient on ACE at home, but has not been taking due to being unable to afford medications.  - Lisinopril restarted today - CM discussed with patient that he can get his meds for $4 at [facility] pharmacy  OSA:  -Continue CPAP  Elevated AST and ALT: AST 54 and ALT 64 on admission. Previously normal 1 year ago. Patient reports only occasional alcohol use. Acute hepatitis panel negative for acute/chronic infection.  Suspect fatty liver disease. Will continue to monitor for now. May warrant RUQ [place] in future.  - He will need Hep A and Hep B vaccines as outpatient  HTN: Currently normotensive - Continue Coreg 6.25 mg BID  - Continue Lasix 80 mg IV TID - Lisinopril 40 mg daily restarted this AM  Hyperlipidemia: Repeat lipid panel shows Chol 145, Trigly 66, HDL 52, and LDL 80.  - Continue atorvastatin 40 mg daily   Diet: Heart healthy VTE PPx: Lovenox SQ Dispo: Discharge today   The patient does have a current PCP (Lorayne Marek, MD) and does need an [facility] hospital follow-up appointment after discharge.  The patient does not have transportation limitations that hinder transportation to clinic appointments.  .Services Needed at time of discharge: Y = Yes, Blank = No PT:   OT:   RN:   Equipment:   Other:     LOS: 5 days   Juliet Rude, MD 10/12/2014, 10:30 AM

## 2014-10-12 NOTE — Discharge Summary (Signed)
Name: Adam Dennis MRN: [id number] DOB: 09-Jan-1961 54 y.o. PCP: Lorayne Marek, MD  Date of Admission: 10/07/2014 10:31 AM Date of Discharge: 10/12/2014 Attending Physician: Annia Belt, MD  Discharge Diagnosis: Principal Problem Acute on Chronic CHF Exacerbation Active Problems Metabolic Alkalosis AKI on CKD Stage II Hypoalbuminemia OSA Elevated AST and ALT HTN Hyperlipidemia   Discharge Medications:   Medication List    STOP taking these medications        atenolol 50 MG tablet  Commonly known as:  TENORMIN      TAKE these medications        amLODipine 10 MG tablet  Commonly known as:  NORVASC  Take 1 tablet (10 mg total) by mouth daily.     aspirin EC 81 MG tablet  Take 81 mg by mouth daily.     atorvastatin 40 MG tablet  Commonly known as:  LIPITOR  Take 1 tablet (40 mg total) by mouth daily.     carvedilol 6.25 MG tablet  Commonly known as:  COREG  Take 1 tablet (6.25 mg total) by mouth 2 (two) times daily with a meal.     furosemide 40 MG tablet  Commonly known as:  LASIX  Take 1 tablet (40 mg total) by mouth 2 (two) times daily.     lisinopril 40 MG tablet  Commonly known as:  PRINIVIL,ZESTRIL  Take 1 tablet (40 mg total) by mouth daily.     naproxen 500 MG tablet  Commonly known as:  NAPROSYN  Take 1 tablet (500 mg total) by mouth 2 (two) times daily.     nitroGLYCERIN 0.4 MG SL tablet  Commonly known as:  NITROSTAT  Place 1 tablet (0.4 mg total) under the tongue every 5 (five) minutes x 3 doses as needed for chest pain.     potassium chloride SA 20 MEQ tablet  Commonly known as:  K-DUR,KLOR-CON  Take 1 tablet (20 mEq total) by mouth daily.        Disposition and follow-up:   Mr.Adam Dennis was discharged from [facility] in Good condition.  At the hospital follow up visit please address:  1.  CHF- D/c'd on Lasix 40 mg BID and K-Dur 20 mEq daily, adjust as needed. Will need referral to cardiology in next  1-2 months for cardiac cath.  AKI on CKD Stage II- Needs repeat bmet. Cr 1.59 upon discharge likely 2/2 diuresis. Baseline ~ 1.2.  Elevated AST and ALT- Consider RUQ [place]. Needs Hep A and Hep B vaccines.  HTN- BP recheck. Consider increasing Coreg if BP and HR can tolerate. Needs bmet to assess K.  HLD- Statin increased to 40 mg daily   2.  Labs / imaging needed at time of follow-up: bmet  3.  Pending labs/ test needing follow-up: None   Follow-up Appointments:     Follow-up Information    Follow up with Lorayne Marek, MD.   Specialty:  Internal Medicine   Why:  Appointment on May 20th at 9:30 AM   Contact information:   [place] [address] [phone]       Discharge Instructions:   Consultations: Treatment Team:  Charolette Forward, MD  Procedures Performed:  Dg Chest 2 View  10/07/2014   CLINICAL DATA:  Short of breath for 1 week  EXAM: CHEST  2 VIEW  COMPARISON:  11/12/2013  FINDINGS: Heart top-normal in size. No mediastinal or hilar masses. Elevated left hemidiaphragm, stable. Clear lungs. No  pleural effusion or pneumothorax.  Bony thorax is intact.  IMPRESSION: No active cardiopulmonary disease.   Electronically Signed   By: Lajean Manes M.D.   On: 10/07/2014 13:18    2D Echo:  10/08/14 Study Conclusions - Left ventricle: There was mild concentric hypertrophy. Systolic function was moderately reduced. The estimated ejection fraction was in the range of 35% to 40%. There is severe hypokinesis of the apicalinferior myocardium. Doppler parameters are consistent with abnormal left ventricular relaxation (grade 1 diastolic dysfunction). - Ventricular septum: Septal motion showed paradox. - Left atrium: The atrium was mildly dilated.   Admission HPI: [name] is a 54yo man with PMHx of HTN, hyperlipidemia, combined systolic and diastolic HF (EF 123456, grade 1 diastolic dysfunction), and obesity who presents to the ED with shortness of  breath. Patient reports gradually worsening SOB for the past 3-4 days. He states he can only walk about 15 steps before he becomes SOB. He notes he cannot lie flat without becoming short of breath and only sleeps 2 hours a night because he wakes up from SOB. He also reports leg swelling and his abdomen becoming softer over the past few days. He denies chest pain at rest, but notes a left-sided burning chest pain when walking. He reports difficulties getting his medications due to finances. He states he does not always have all of his medications at one time. He reports recently running out of Lasix last month, but states he has been taking it for the past month. He is also on another medication that starts with an "a" but is unsure of the name. He does not seem familiar with most of his medications. He works as a Training and development officer and notes increasing dyspnea at work as well. He denies abdominal pain, nausea, diaphoresis, cough, or wheezing.   Hospital Course by problem list:   Acute on Chronic CHF Exacerbation: Patient presented with a 3-4 day history of progressively worsening dyspnea, orthopnea, PND, and leg swelling in the setting of medication noncompliance (due to financial difficulty) and underlying CHF. His admission weight was 376 lbs which is ~50 lbs above his baseline. He was placed on Lasix 80 mg IV TID and responded well to this with a net 5.2 L UOP and down 25 lbs total (weight 351 lbs). His dyspnea had improved significantly and he was able to ambulate the halls without becoming dyspneic. He had a repeat echocardiogram which showed his EF was stable at 123456, grade 1 diastolic dysfunction. Pulmonary artery pressures were not assessed due to poor visualization. Patient was switched from Atenolol to Coreg as there is better evidence for using Coreg and decreased mortality. Cardiology was consulted and had recommended to do a right and left heart cath in the next 1-2 months once the patient's weight had come  down more. The patient has never had a cardiac cath and would benefit from further work up of possible CAD and assessing his pulmonary artery pressures. He was discharged on Lasix 40 mg BID, Coreg 6.25 mg BID, Lisinopril 40 mg daily, K-dur 20 mEq daily, and aspirin 81 mg daily. He was instructed to pick up his medications at the [facility] pharmacy as his medications will cost $4 each there and he stated he would be able to afford this. He has follow up with his PCP on 10/14/14. He will also need Cardiology follow up in the next 1-2 months for reassessment for cardiac cath.   Metabolic Alkalosis: Secondary to diuresis. Patient received acetazolamide twice while in the  hospital. His bicarb was 35 upon discharge. He will need a repeat bmet at his outpatient visit.   AKI on CKD Stage II: Cr 1.65 on admission and was 1.59 by time of discharge. His baseline appears to be 1.2. His ACE inhibitor was held until the day of discharge. His Cr likely remained elevated due to his CHF exacerbation and aggressive diuresis. He will need a repeat bmet at his outpatient follow-up visit.  Hypoalbuminemia: Albumin 2.6 on admission. Within normal range 1 year ago at 3.7. U/A showed proteinuria and Cr stable. Patient on ACE at home, but has not been taking due to being unable to afford medications.His Lisinopril was restarted at discharge and patient recommended to pick up medications at [place] since medications are $4.   OSA: Patient was continued on his home CPAP.   Elevated AST and ALT: AST 54 and ALT 64 on admission. Previously normal 1 year ago. Patient reported only occasional alcohol use. Acute hepatitis panel negative for acute/chronic infection. Suspect fatty liver disease. Should continue to be monitored as an outpatient. He may warrant a RUQ [place] in the future for further assessment. He would also benefit from Hep A and Hep B vaccines as outpatient.   HTN: Patient remained normotensive. He was switched from Atenolol  to Coreg as there is better evidence for decreased mortality. He was discharged on Lasix 40 mg BID, Coreg 6.25 mg BID, and Lisinopril 40 mg daily.   Hyperlipidemia: Lipid panel showed Chol 145, Trigly 66, HDL 52, and LDL 80. Patient's home atorvastatin was increased from 10 mg to 40 mg daily as his ASCVD risk was calculated to be 8.0% and he would benefit from higher statin therapy.   Discharge Vitals:   BP 105/60 mmHg  Pulse 76  Temp(Src) 97.3 F (36.3 C) (Oral)  Resp 18  Ht 5\' 11"  (1.803 m)  Wt 351 lb 9.6 oz (159.485 kg)  BMI 49.06 kg/m2  SpO2 94% Physical Exam General: sitting up in bed, pleasant, NAD HEENT: PERRL, EOMI, no scleral icterus Cardiac: RRR, no m/g/r Pulm: no respiratory distress, CTA bilaterally  Abd: BS+, soft, obese, nontender, nondistended Ext: warm and well perfused, trace peripheral edema Neuro: alert and oriented X 3  Discharge Labs:  Results for orders placed or performed during the hospital encounter of 10/07/14 (from the past 24 hour(s))  Basic metabolic panel     Status: Abnormal   Collection Time: 10/12/14  3:48 AM  Result Value Ref Range   Sodium 139 135 - 145 mmol/L   Potassium 3.9 3.5 - 5.1 mmol/L   Chloride 95 (L) 101 - 111 mmol/L   CO2 35 (H) 22 - 32 mmol/L   Glucose, Bld 107 (H) 65 - 99 mg/dL   BUN 22 (H) 6 - 20 mg/dL   Creatinine, Ser 1.59 (H) 0.61 - 1.24 mg/dL   Calcium 8.1 (L) 8.9 - 10.3 mg/dL   GFR calc non Af Amer 48 (L) >60 mL/min   GFR calc Af Amer 56 (L) >60 mL/min   Anion gap 9 5 - 15    Signed: Juliet Rude, MD 10/12/2014, 10:44 AM    Services Ordered on Discharge: None Equipment Ordered on Discharge: None

## 2014-10-14 ENCOUNTER — Encounter: Payer: Self-pay | Admitting: Internal Medicine

## 2014-10-14 ENCOUNTER — Ambulatory Visit: Payer: Medicaid Other | Attending: Internal Medicine | Admitting: Internal Medicine

## 2014-10-14 VITALS — BP 121/70 | HR 82 | Temp 97.5°F | Resp 16 | Wt 360.0 lb

## 2014-10-14 DIAGNOSIS — E785 Hyperlipidemia, unspecified: Secondary | ICD-10-CM | POA: Insufficient documentation

## 2014-10-14 DIAGNOSIS — I5043 Acute on chronic combined systolic (congestive) and diastolic (congestive) heart failure: Secondary | ICD-10-CM | POA: Diagnosis not present

## 2014-10-14 DIAGNOSIS — N289 Disorder of kidney and ureter, unspecified: Secondary | ICD-10-CM | POA: Diagnosis not present

## 2014-10-14 DIAGNOSIS — I10 Essential (primary) hypertension: Secondary | ICD-10-CM | POA: Diagnosis not present

## 2014-10-14 LAB — COMPLETE METABOLIC PANEL WITHOUT GFR
ALT: 38 U/L (ref 0–53)
AST: 30 U/L (ref 0–37)
Albumin: 3 g/dL — ABNORMAL LOW (ref 3.5–5.2)
Alkaline Phosphatase: 48 U/L (ref 39–117)
BUN: 34 mg/dL — ABNORMAL HIGH (ref 6–23)
CO2: 35 meq/L — ABNORMAL HIGH (ref 19–32)
Calcium: 8.7 mg/dL (ref 8.4–10.5)
Chloride: 99 meq/L (ref 96–112)
Creat: 1.41 mg/dL — ABNORMAL HIGH (ref 0.50–1.35)
GFR, Est African American: 66 mL/min
GFR, Est Non African American: 57 mL/min — ABNORMAL LOW
Glucose, Bld: 94 mg/dL (ref 70–99)
Potassium: 5 meq/L (ref 3.5–5.3)
Sodium: 139 meq/L (ref 135–145)
Total Bilirubin: 0.8 mg/dL (ref 0.2–1.2)
Total Protein: 7.1 g/dL (ref 6.0–8.3)

## 2014-10-14 NOTE — Progress Notes (Signed)
Patient here for follow up from the hospital Was admitted for CHF Patient was discharged yesterday and told to follow up with his primary doctor Patient is currently taking 40mg  lasix twice a day Patient states he is feeling much better

## 2014-10-14 NOTE — Patient Instructions (Signed)
DASH Eating Plan °DASH stands for "Dietary Approaches to Stop Hypertension." The DASH eating plan is a healthy eating plan that has been shown to reduce high blood pressure (hypertension). Additional health benefits may include reducing the risk of type 2 diabetes mellitus, heart disease, and stroke. The DASH eating plan may also help with weight loss. °WHAT DO I NEED TO KNOW ABOUT THE DASH EATING PLAN? °For the DASH eating plan, you will follow these general guidelines: °· Choose foods with a percent daily value for sodium of less than 5% (as listed on the food label). °· Use salt-free seasonings or herbs instead of table salt or sea salt. °· Check with your health care provider or pharmacist before using salt substitutes. °· Eat lower-sodium products, often labeled as "lower sodium" or "no salt added." °· Eat fresh foods. °· Eat more vegetables, fruits, and low-fat dairy products. °· Choose whole grains. Look for the word "whole" as the first word in the ingredient list. °· Choose fish and skinless chicken or turkey more often than red meat. Limit fish, poultry, and meat to 6 oz (170 g) each day. °· Limit sweets, desserts, sugars, and sugary drinks. °· Choose heart-healthy fats. °· Limit cheese to 1 oz (28 g) per day. °· Eat more home-cooked food and less restaurant, buffet, and fast food. °· Limit fried foods. °· Cook foods using methods other than frying. °· Limit canned vegetables. If you do use them, rinse them well to decrease the sodium. °· When eating at a restaurant, ask that your food be prepared with less salt, or no salt if possible. °WHAT FOODS CAN I EAT? °Seek help from a dietitian for individual calorie needs. °Grains °Whole grain or whole wheat bread. Brown rice. Whole grain or whole wheat pasta. Quinoa, bulgur, and whole grain cereals. Low-sodium cereals. Corn or whole wheat flour tortillas. Whole grain cornbread. Whole grain crackers. Low-sodium crackers. °Vegetables °Fresh or frozen vegetables  (raw, steamed, roasted, or grilled). Low-sodium or reduced-sodium tomato and vegetable juices. Low-sodium or reduced-sodium tomato sauce and paste. Low-sodium or reduced-sodium canned vegetables.  °Fruits °All fresh, canned (in natural juice), or frozen fruits. °Meat and Other Protein Products °Ground beef (85% or leaner), grass-fed beef, or beef trimmed of fat. Skinless chicken or turkey. Ground chicken or turkey. Pork trimmed of fat. All fish and seafood. Eggs. Dried beans, peas, or lentils. Unsalted nuts and seeds. Unsalted canned beans. °Dairy °Low-fat dairy products, such as skim or 1% milk, 2% or reduced-fat cheeses, low-fat ricotta or cottage cheese, or plain low-fat yogurt. Low-sodium or reduced-sodium cheeses. °Fats and Oils °Tub margarines without trans fats. Light or reduced-fat mayonnaise and salad dressings (reduced sodium). Avocado. Safflower, olive, or canola oils. Natural peanut or almond butter. °Other °Unsalted popcorn and pretzels. °The items listed above may not be a complete list of recommended foods or beverages. Contact your dietitian for more options. °WHAT FOODS ARE NOT RECOMMENDED? °Grains °White bread. White pasta. White rice. Refined cornbread. Bagels and croissants. Crackers that contain trans fat. °Vegetables °Creamed or fried vegetables. Vegetables in a cheese sauce. Regular canned vegetables. Regular canned tomato sauce and paste. Regular tomato and vegetable juices. °Fruits °Dried fruits. Canned fruit in light or heavy syrup. Fruit juice. °Meat and Other Protein Products °Fatty cuts of meat. Ribs, chicken wings, bacon, sausage, bologna, salami, chitterlings, fatback, hot dogs, bratwurst, and packaged luncheon meats. Salted nuts and seeds. Canned beans with salt. °Dairy °Whole or 2% milk, cream, half-and-half, and cream cheese. Whole-fat or sweetened yogurt. Full-fat   cheeses or blue cheese. Nondairy creamers and whipped toppings. Processed cheese, cheese spreads, or cheese  curds. °Condiments °Onion and garlic salt, seasoned salt, table salt, and sea salt. Canned and packaged gravies. Worcestershire sauce. Tartar sauce. Barbecue sauce. Teriyaki sauce. Soy sauce, including reduced sodium. Steak sauce. Fish sauce. Oyster sauce. Cocktail sauce. Horseradish. Ketchup and mustard. Meat flavorings and tenderizers. Bouillon cubes. Hot sauce. Tabasco sauce. Marinades. Taco seasonings. Relishes. °Fats and Oils °Butter, stick margarine, lard, shortening, ghee, and bacon fat. Coconut, palm kernel, or palm oils. Regular salad dressings. °Other °Pickles and olives. Salted popcorn and pretzels. °The items listed above may not be a complete list of foods and beverages to avoid. Contact your dietitian for more information. °WHERE CAN I FIND MORE INFORMATION? °National Heart, [employer], [employer] Institute: www.nhlbi.nih.gov/health/health-topics/topics/dash/ °Document Released: 05/02/2011 Document Revised: 09/27/2013 Document Reviewed: 03/17/2013 °[employer]® Patient Information ©2015 [employer]. This information is not intended to replace advice given to you by your health care provider. Make sure you discuss any questions you have with your health care provider. ° °

## 2014-10-14 NOTE — Progress Notes (Signed)
MRN: [id number] Name: Adam Dennis  Sex: male Age: 54 y.o. DOB: [birth year]  Allergies: Review of patient's allergies indicates no known allergies.  Chief Complaint  Patient presents with  . Hospitalization Follow-up    HPI: Patient is 54 y.o. male who  has history of hypertension hyperlipidemia combined systolic and diastolic CHF, recently hospitalized with symptoms of shortness of breath, EMR reviewed patient had acute on chronic CHF exacerbation, cardiology was consulted and was recommended to get left and right heart cath in the next 1-2 months, he was treated for CHF exacerbation was discharged on Lasix Coreg lisinopril , potassium supplement patient also had metabolic alkalosis secondary to diuresis and was given acetazolamide and hospital, he also had some renal insufficiency, had abnormal LFTs but subsequent hepatitis panel was negative, patient reports improvement in the symptoms denies any shortness of breath orthopnea or PND, as per patient he will follow up with his cardiologist in a month's time   Past Medical History  Diagnosis Date  . Hypertrophy of tonsils alone   . Chronic combined systolic and diastolic heart failure   . Obesity, unspecified   . Obstructive sleep apnea (adult) (pediatric)   . Hypertension   . CHF (congestive heart failure)     Past Surgical History  Procedure Laterality Date  . Umbilical hernia repair        Medication List       This list is accurate as of: 10/14/14 10:06 AM.  Always use your most recent med list.               amLODipine 10 MG tablet  Commonly known as:  NORVASC  Take 1 tablet (10 mg total) by mouth daily.     aspirin EC 81 MG tablet  Take 81 mg by mouth daily.     atorvastatin 40 MG tablet  Commonly known as:  LIPITOR  Take 1 tablet (40 mg total) by mouth daily.     carvedilol 6.25 MG tablet  Commonly known as:  COREG  Take 1 tablet (6.25 mg total) by mouth 2 (two) times daily with a meal.     furosemide 40 MG tablet  Commonly known as:  LASIX  Take 1 tablet (40 mg total) by mouth 2 (two) times daily.     lisinopril 40 MG tablet  Commonly known as:  PRINIVIL,ZESTRIL  Take 1 tablet (40 mg total) by mouth daily.     naproxen 500 MG tablet  Commonly known as:  NAPROSYN  Take 1 tablet (500 mg total) by mouth 2 (two) times daily.     nitroGLYCERIN 0.4 MG SL tablet  Commonly known as:  NITROSTAT  Place 1 tablet (0.4 mg total) under the tongue every 5 (five) minutes x 3 doses as needed for chest pain.     potassium chloride SA 20 MEQ tablet  Commonly known as:  K-DUR,KLOR-CON  Take 1 tablet (20 mEq total) by mouth daily.        No orders of the defined types were placed in this encounter.    Immunization History  Administered Date(s) Administered  . Influenza Whole 06/02/2009  . Influenza,inj,Quad PF,36+ Mos 02/15/2013  . Pneumococcal Polysaccharide-23 12/13/2012    Family History  Problem Relation Age of Onset  . Diabetes Brother   . Hypertension Mother   . Diabetes Sister   . Cancer Maternal Aunt     History  Substance Use Topics  . Smoking status: Never Smoker   . Smokeless tobacco:  Never Used  . Alcohol Use: No    Review of Systems   As noted in HPI  Filed Vitals:   10/14/14 0921  BP: 121/70  Pulse: 82  Temp: 97.5 F (36.4 C)  Resp: 16    Physical Exam  Physical Exam  Constitutional:  Obese male sitting comfortably not in acute distress  Eyes: EOM are normal. Pupils are equal, round, and reactive to light.  Cardiovascular: Normal rate and regular rhythm.   Pulmonary/Chest: Breath sounds normal. No respiratory distress. He has no wheezes. He has no rales.  Musculoskeletal:  Trace pedal edema    CBC    Component Value Date/Time   WBC 5.9 10/11/2014 0255   RBC 5.74 10/11/2014 0255   HGB 14.7 10/11/2014 0255   HCT 47.7 10/11/2014 0255   PLT 255 10/11/2014 0255   MCV 83.1 10/11/2014 0255   LYMPHSABS 1.5 10/07/2014 1051   MONOABS  1.0 10/07/2014 1051   EOSABS 0.0 10/07/2014 1051   BASOSABS 0.0 10/07/2014 1051    CMP     Component Value Date/Time   NA 139 10/12/2014 0348   K 3.9 10/12/2014 0348   CL 95* 10/12/2014 0348   CO2 35* 10/12/2014 0348   GLUCOSE 107* 10/12/2014 0348   BUN 22* 10/12/2014 0348   CREATININE 1.59* 10/12/2014 0348   CREATININE 1.23 07/27/2013 0909   CALCIUM 8.1* 10/12/2014 0348   PROT 6.5 10/07/2014 1051   ALBUMIN 2.6* 10/07/2014 1051   AST 54* 10/07/2014 1051   ALT 64* 10/07/2014 1051   ALKPHOS 44 10/07/2014 1051   BILITOT 0.7 10/07/2014 1051   GFRNONAA 48* 10/12/2014 0348   GFRNONAA 68 07/27/2013 0909   GFRAA 56* 10/12/2014 0348   GFRAA 78 07/27/2013 0909    Lab Results  Component Value Date/Time   CHOL 145 10/07/2014 03:47 PM    Lab Results  Component Value Date/Time   HGBA1C 6.7* 10/07/2014 03:47 PM    Lab Results  Component Value Date/Time   AST 54* 10/07/2014 10:51 AM    Assessment and Plan  Acute on chronic combined systolic and diastolic congestive heart failure - Plan: patient is currently on Lasix ACE inhibitor Coreg her statins aspirin, symptomatically improved will repeat COMPLETE METABOLIC PANEL WITH GFR, patient follow with his cardiologist  Essential hypertension, benign - Plan: blood pressure is well controlled, continue with current meds COMPLETE METABOLIC PANEL WITH GFR  Hyperlipidemia Currently patient is on Lipitor 40 mg, will check fasting lipid panel on next visit  Renal insufficiency - Plan: Advise patient to avoid NSAIDs COMPLETE METABOLIC PANEL WITH GFR     Return in about 3 months (around 01/14/2015).   This note has been created with Surveyor, quantity. Any transcriptional errors are unintentional.    Lorayne Marek, MD

## 2014-10-17 ENCOUNTER — Telehealth: Payer: Self-pay

## 2014-10-17 NOTE — Telephone Encounter (Signed)
Patient is not available Left message on voice mail to return our call

## 2014-10-17 NOTE — Telephone Encounter (Signed)
-----   Message from Lorayne Marek, MD sent at 10/17/2014  9:26 AM EDT ----- Blood work reviewed, call and let the patient know that his creatinine is improved but still  elevated, advise patient to  avoid taking any over-the-counter NSAIDs including Aleve, ibuprofen, will repeat blood chemistry on the following visit.

## 2014-10-19 ENCOUNTER — Ambulatory Visit: Payer: No Typology Code available for payment source | Attending: Internal Medicine

## 2014-11-10 ENCOUNTER — Telehealth: Payer: Self-pay | Admitting: Internal Medicine

## 2014-11-10 NOTE — Telephone Encounter (Signed)
Pt is requesting a refill for the following. Please follow up with pt. Thank you.  potassium chloride SA (K-DUR,KLOR-CON) 20 MEQ tablet lisinopril (PRINIVIL,ZESTRIL) 40 MG tablet amLODipine (NORVASC) 10 MG tablet atorvastatin (LIPITOR) 40 MG tablet carvedilol (COREG) 6.25 MG tablet furosemide (LASIX) 40 MG tablet

## 2014-11-11 ENCOUNTER — Other Ambulatory Visit: Payer: Self-pay

## 2014-11-11 ENCOUNTER — Telehealth: Payer: Self-pay | Admitting: Internal Medicine

## 2014-11-11 MED ORDER — POTASSIUM CHLORIDE CRYS ER 20 MEQ PO TBCR: 20.0000 meq | EXTENDED_RELEASE_TABLET | Freq: Every day | ORAL | Status: DC

## 2014-11-11 MED ORDER — AMLODIPINE BESYLATE 10 MG PO TABS: 10.0000 mg | ORAL_TABLET | Freq: Every day | ORAL | Status: DC

## 2014-11-11 MED ORDER — ATORVASTATIN CALCIUM 40 MG PO TABS: 40.0000 mg | ORAL_TABLET | Freq: Every day | ORAL | Status: DC

## 2014-11-11 MED ORDER — CARVEDILOL 6.25 MG PO TABS: 6.2500 mg | ORAL_TABLET | Freq: Two times a day (BID) | ORAL | Status: DC

## 2014-11-11 MED ORDER — FUROSEMIDE 40 MG PO TABS: 40.0000 mg | ORAL_TABLET | Freq: Two times a day (BID) | ORAL | Status: DC

## 2014-11-11 MED ORDER — LISINOPRIL 40 MG PO TABS: 40.0000 mg | ORAL_TABLET | Freq: Every day | ORAL | Status: DC

## 2014-11-11 NOTE — Telephone Encounter (Signed)
Pt is calling to check on his request for medication refills. Please follow up with pt.

## 2014-11-11 NOTE — Progress Notes (Unsigned)
Patient called requesting refills on potassium lisinopril amlodipine his statin carvedolol and lasix [facility]

## 2014-11-22 ENCOUNTER — Inpatient Hospital Stay (HOSPITAL_COMMUNITY)
Admission: EM | Admit: 2014-11-22 | Discharge: 2014-11-27 | DRG: 292 | Disposition: A | Discharge status: Home / Self Care | Payer: Medicaid Other | Attending: Cardiovascular Disease | Admitting: Cardiovascular Disease

## 2014-11-22 ENCOUNTER — Emergency Department (HOSPITAL_COMMUNITY): Payer: Medicaid Other

## 2014-11-22 ENCOUNTER — Encounter (HOSPITAL_COMMUNITY): Payer: Self-pay | Admitting: Nurse Practitioner

## 2014-11-22 DIAGNOSIS — R0902 Hypoxemia: Secondary | ICD-10-CM | POA: Diagnosis present

## 2014-11-22 DIAGNOSIS — I129 Hypertensive chronic kidney disease with stage 1 through stage 4 chronic kidney disease, or unspecified chronic kidney disease: Secondary | ICD-10-CM | POA: Diagnosis present

## 2014-11-22 DIAGNOSIS — I5023 Acute on chronic systolic (congestive) heart failure: Secondary | ICD-10-CM | POA: Diagnosis present

## 2014-11-22 DIAGNOSIS — E662 Morbid (severe) obesity with alveolar hypoventilation: Secondary | ICD-10-CM | POA: Diagnosis present

## 2014-11-22 DIAGNOSIS — N182 Chronic kidney disease, stage 2 (mild): Secondary | ICD-10-CM | POA: Diagnosis present

## 2014-11-22 DIAGNOSIS — I5043 Acute on chronic combined systolic (congestive) and diastolic (congestive) heart failure: Secondary | ICD-10-CM | POA: Diagnosis present

## 2014-11-22 DIAGNOSIS — Z7982 Long term (current) use of aspirin: Secondary | ICD-10-CM | POA: Diagnosis not present

## 2014-11-22 DIAGNOSIS — I509 Heart failure, unspecified: Secondary | ICD-10-CM

## 2014-11-22 DIAGNOSIS — Z6841 Body Mass Index (BMI) 40.0 and over, adult: Secondary | ICD-10-CM | POA: Diagnosis not present

## 2014-11-22 DIAGNOSIS — R0602 Shortness of breath: Secondary | ICD-10-CM | POA: Diagnosis present

## 2014-11-22 DIAGNOSIS — R29898 Other symptoms and signs involving the musculoskeletal system: Secondary | ICD-10-CM

## 2014-11-22 LAB — CBC
HCT: 45.7 % (ref 39.0–52.0)
Hemoglobin: 13.7 g/dL (ref 13.0–17.0)
MCH: 24 pg — ABNORMAL LOW (ref 26.0–34.0)
MCHC: 30 g/dL (ref 30.0–36.0)
MCV: 80 fL (ref 78.0–100.0)
Platelets: 246 10*3/uL (ref 150–400)
RBC: 5.71 MIL/uL (ref 4.22–5.81)
RDW: 18.5 % — AB (ref 11.5–15.5)
WBC: 5 10*3/uL (ref 4.0–10.5)

## 2014-11-22 LAB — BASIC METABOLIC PANEL
Anion gap: 6 (ref 5–15)
BUN: 15 mg/dL (ref 6–20)
CO2: 32 mmol/L (ref 22–32)
Calcium: 8.1 mg/dL — ABNORMAL LOW (ref 8.9–10.3)
Chloride: 98 mmol/L — ABNORMAL LOW (ref 101–111)
Creatinine, Ser: 1.52 mg/dL — ABNORMAL HIGH (ref 0.61–1.24)
GFR calc non Af Amer: 51 mL/min — ABNORMAL LOW (ref >60)
GFR, EST AFRICAN AMERICAN: 59 mL/min — AB (ref >60)
GLUCOSE: 104 mg/dL — AB (ref 65–99)
POTASSIUM: 4.9 mmol/L (ref 3.5–5.1)
SODIUM: 136 mmol/L (ref 135–145)

## 2014-11-22 LAB — TROPONIN I: Troponin I: <0.03 ng/mL (ref <0.031)

## 2014-11-22 LAB — I-STAT TROPONIN, ED: TROPONIN I, POC: 0.01 ng/mL (ref 0.00–0.08)

## 2014-11-22 LAB — GLUCOSE, CAPILLARY: GLUCOSE-CAPILLARY: 111 mg/dL — AB (ref 65–99)

## 2014-11-22 LAB — BRAIN NATRIURETIC PEPTIDE: B NATRIURETIC PEPTIDE 5: 273.2 pg/mL — AB (ref 0.0–100.0)

## 2014-11-22 MED ORDER — FUROSEMIDE 10 MG/ML IJ SOLN
40.0000 mg | Freq: Two times a day (BID) | INTRAMUSCULAR | Status: DC
  Administered 2014-11-23 – 2014-11-27 (×9): 40 mg via INTRAVENOUS
  Filled 2014-11-22 (×10): qty 4

## 2014-11-22 MED ORDER — POTASSIUM CHLORIDE CRYS ER 20 MEQ PO TBCR: 20.0000 meq | EXTENDED_RELEASE_TABLET | Freq: Every day | ORAL | Status: DC

## 2014-11-22 MED ORDER — FUROSEMIDE 10 MG/ML IJ SOLN
40.0000 mg | Freq: Once | INTRAMUSCULAR | Status: AC
  Administered 2014-11-22: 40 mg via INTRAVENOUS
  Filled 2014-11-22: qty 4

## 2014-11-22 MED ORDER — ASPIRIN EC 81 MG PO TBEC
81.0000 mg | DELAYED_RELEASE_TABLET | Freq: Every day | ORAL | Status: DC
  Administered 2014-11-23 – 2014-11-27 (×5): 81 mg via ORAL
  Filled 2014-11-22 (×5): qty 1

## 2014-11-22 MED ORDER — ACETAMINOPHEN 325 MG PO TABS: 650.0000 mg | ORAL_TABLET | ORAL | Status: DC | PRN

## 2014-11-22 MED ORDER — LISINOPRIL 40 MG PO TABS
40.0000 mg | ORAL_TABLET | Freq: Every day | ORAL | Status: DC
  Administered 2014-11-23: 40 mg via ORAL
  Filled 2014-11-22: qty 1

## 2014-11-22 MED ORDER — HEPARIN SODIUM (PORCINE) 5000 UNIT/ML IJ SOLN
5000.0000 [IU] | Freq: Three times a day (TID) | INTRAMUSCULAR | Status: DC
  Administered 2014-11-22 – 2014-11-27 (×14): 5000 [IU] via SUBCUTANEOUS
  Filled 2014-11-22 (×15): qty 1

## 2014-11-22 MED ORDER — AMLODIPINE BESYLATE 10 MG PO TABS
10.0000 mg | ORAL_TABLET | Freq: Every day | ORAL | Status: DC
  Administered 2014-11-23: 10 mg via ORAL
  Filled 2014-11-22: qty 1

## 2014-11-22 MED ORDER — ATORVASTATIN CALCIUM 40 MG PO TABS
40.0000 mg | ORAL_TABLET | Freq: Every day | ORAL | Status: DC
  Administered 2014-11-23 – 2014-11-27 (×5): 40 mg via ORAL
  Filled 2014-11-22 (×5): qty 1

## 2014-11-22 MED ORDER — SODIUM CHLORIDE 0.9 % IJ SOLN: 3.0000 mL | INTRAMUSCULAR | Status: DC | PRN

## 2014-11-22 MED ORDER — SODIUM CHLORIDE 0.9 % IJ SOLN
3.0000 mL | Freq: Two times a day (BID) | INTRAMUSCULAR | Status: DC
  Administered 2014-11-22 – 2014-11-27 (×10): 3 mL via INTRAVENOUS

## 2014-11-22 MED ORDER — AMLODIPINE BESYLATE 10 MG PO TABS: 10.0000 mg | ORAL_TABLET | Freq: Every day | ORAL | Status: DC

## 2014-11-22 MED ORDER — ONDANSETRON HCL 4 MG/2ML IJ SOLN: 4.0000 mg | Freq: Four times a day (QID) | INTRAMUSCULAR | Status: DC | PRN

## 2014-11-22 MED ORDER — CARVEDILOL 6.25 MG PO TABS
6.2500 mg | ORAL_TABLET | Freq: Two times a day (BID) | ORAL | Status: DC
  Administered 2014-11-22 – 2014-11-23 (×2): 6.25 mg via ORAL
  Filled 2014-11-22 (×4): qty 1

## 2014-11-22 MED ORDER — POTASSIUM CHLORIDE CRYS ER 20 MEQ PO TBCR
20.0000 meq | EXTENDED_RELEASE_TABLET | Freq: Every day | ORAL | Status: DC
  Administered 2014-11-23: 20 meq via ORAL
  Filled 2014-11-22 (×2): qty 1

## 2014-11-22 MED ORDER — SODIUM CHLORIDE 0.9 % IV SOLN: 250.0000 mL | INTRAVENOUS | Status: DC | PRN

## 2014-11-22 MED ORDER — NITROGLYCERIN 0.4 MG SL SUBL
0.4000 mg | SUBLINGUAL_TABLET | SUBLINGUAL | Status: DC | PRN
Start: 2014-11-22 — End: 2014-11-27

## 2014-11-22 MED ORDER — ASPIRIN EC 81 MG PO TBEC: 81.0000 mg | DELAYED_RELEASE_TABLET | Freq: Every day | ORAL | Status: DC

## 2014-11-22 MED ORDER — LISINOPRIL 40 MG PO TABS: 40.0000 mg | ORAL_TABLET | Freq: Every day | ORAL | Status: DC

## 2014-11-22 NOTE — H&P (Signed)
Referring Physician:  JOLLY BLEICHER is an 54 y.o. male.                       Chief Complaint: Shortness of breath and leg swelling  HPI:  54 year old male with a history of CHF presents with dyspnea on exertion and bilateral lower extremity edema over the last 3-4 days. Similar to prior CHF exacerbations. When he walks he also gets a "pain" to his left axillary chest. He states he always gets this type of pain whenever he has to much fluid. Patient denies any pain or shortness of breath at rest. Patient noted to be hypoxic to 76% on room air on arrival. Patient denies any pleuritic pain. No cough or fever. Has been taking his Lasix as instructed, last took it this morning.  Past Medical History  Diagnosis Date  . Hypertrophy of tonsils alone   . Chronic combined systolic and diastolic heart failure   . Obesity, unspecified   . Obstructive sleep apnea (adult) (pediatric)   . Hypertension   . CHF (congestive heart failure)       Past Surgical History  Procedure Laterality Date  . Umbilical hernia repair      Family History  Problem Relation Age of Onset  . Diabetes Brother   . Hypertension Mother   . Diabetes Sister   . Cancer Maternal Aunt    Social History:  reports that he has never smoked. He has never used smokeless tobacco. He reports that he does not drink alcohol or use illicit drugs.  Allergies: No Known Allergies   (Not in a hospital admission)  Results for orders placed or performed during the hospital encounter of 11/22/14 (from the past 48 hour(s))  CBC     Status: Abnormal   Collection Time: 11/22/14  2:04 PM  Result Value Ref Range   WBC 5.0 4.0 - 10.5 K/uL   RBC 5.71 4.22 - 5.81 MIL/uL   Hemoglobin 13.7 13.0 - 17.0 g/dL   HCT 45.7 39.0 - 52.0 %   MCV 80.0 78.0 - 100.0 fL   MCH 24.0 (L) 26.0 - 34.0 pg   MCHC 30.0 30.0 - 36.0 g/dL   RDW 18.5 (H) 11.5 - 15.5 %   Platelets 246 150 - 400 K/uL    Comment: PLATELET COUNT CONFIRMED BY SMEAR REPEATED TO  VERIFY   Basic metabolic panel     Status: Abnormal   Collection Time: 11/22/14  2:04 PM  Result Value Ref Range   Sodium 136 135 - 145 mmol/L   Potassium 4.9 3.5 - 5.1 mmol/L   Chloride 98 (L) 101 - 111 mmol/L   CO2 32 22 - 32 mmol/L   Glucose, Bld 104 (H) 65 - 99 mg/dL   BUN 15 6 - 20 mg/dL   Creatinine, Ser 1.52 (H) 0.61 - 1.24 mg/dL   Calcium 8.1 (L) 8.9 - 10.3 mg/dL   GFR calc non Af Amer 51 (L) >60 mL/min   GFR calc Af Amer 59 (L) >60 mL/min    Comment: (NOTE) The eGFR has been calculated using the CKD EPI equation. This calculation has not been validated in all clinical situations. eGFR's persistently <60 mL/min signify possible Chronic Kidney Disease.    Anion gap 6 5 - 15  BNP (order ONLY if patient complains of dyspnea/SOB AND you have documented it for THIS visit)     Status: Abnormal   Collection Time: 11/22/14  2:04 PM  Result Value  Ref Range   B Natriuretic Peptide 273.2 (H) 0.0 - 100.0 pg/mL  I-stat troponin, ED  (not at [facility])     Status: None   Collection Time: 11/22/14  2:20 PM  Result Value Ref Range   Troponin i, poc 0.01 0.00 - 0.08 ng/mL   Comment 3            Comment: Due to the release kinetics of cTnI, a negative result within the first hours of the onset of symptoms does not rule out myocardial infarction with certainty. If myocardial infarction is still suspected, repeat the test at appropriate intervals.    Dg Chest 2 View  11/22/2014   CLINICAL DATA:  Congestive heart failure.  Hypertension.  EXAM: CHEST  2 VIEW  COMPARISON:  10/07/2014  FINDINGS: Right heart enlargement.  Elevation of the left hemidiaphragm.  Thoracic spondylosis. Subsegmental atelectasis along the left hemidiaphragm.  IMPRESSION: 1. Generalized low lung volumes but also with elevation of the left hemidiaphragm mild atelectasis along the left hemidiaphragm. 2. Enlarged right heart, causing the right heart border could be more prominent than the left. The left hemidiaphragmatic  elevation also causes some shift of mediastinal structures to the right.   Electronically Signed   By: Van Clines M.D.   On: 11/22/2014 15:16    Review Of Systems General: Denies fever, chills, diaphoresis, appetite change and fatigue.  HEENT: Denies change in vision, eye pain, redness, hearing loss, congestion, sore throat, rhinorrhea, sneezing, mouth sores, trouble swallowing, neck pain, neck stiffness and tinnitus.  Respiratory: Complains of severe DOE. Denies chest tightness, wheezing or cough.  Cardiovascular: Complains of worsening LE swelling. Positive chest pain. No palpitations. Gastrointestinal: Denies nausea, vomiting, abdominal pain.  Genitourinary: Denies dysuria, urgency, frequency, hematuria, flank pain and difficulty urinating.  Endocrine: Denies hot or cold intolerance, sweats, polyuria, polydipsia. Musculoskeletal: Denies myalgias, back pain, joint swelling, arthralgias and gait problem.  Skin: Denies pallor, rash and wounds.  Neurological: Complains of lightheadedness w/ episodes of SOB. Denies dizziness, seizures, syncope, weakness, numbness and headaches.  Hematological: Denies adenopathy,easy bruising, personal or family history of bleeding.  Psychiatric/Behavioral: Denies mood changes, confusion, nervousness, Positive sleep disturbance and agitation.  Blood pressure 110/45, pulse 71, temperature 98.5 F (36.9 C), temperature source Oral, resp. rate 30, height 5' 11" (1.803 m), weight 171.051 kg (377 lb 1.6 oz), SpO2 100 %. Physical Exam: General: Vital signs reviewed. Patient is a well-developed and well-nourished, in mild acute distress and cooperative with exam.  Head: Normocephalic and atraumatic. Nose: No erythema or drainage noted. Turbinates normal. Mouth: No erythema, exudates, sores, or ulcerations. Moist mucus membranes. Eyes: Brown, PERRL, EOMI, conjunctivae normal, No scleral icterus.  Neck: JVD present. Supple, trachea midline, normal ROM,  masses, thyromegaly, or carotid bruit present.  Cardiovascular: RRR, S1 normal, S2 normal, no murmurs, gallops, or rubs. Pulmonary/Chest: normal respiratory effort, CTAB, no wheezes, rales, or rhonchi. Abdominal: Soft. Non-tender, non-distended, bowel sounds are normal, no masses, organomegaly, or guarding present.  GU: No CVA tenderness. Musculoskeletal: No joint deformities, erythema, or stiffness, ROM full and no nontender. Extremities: +2 LE edema extending up to lower back. No cyanosis or clubbing. Neurological: A&O x3, Strength is normal and symmetric bilaterally, cranial nerve II-XII are grossly intact, no focal motor deficit, sensory intact to light touch bilaterally.  Skin: Warm, dry and intact. No rashes or erythema. Psychiatric: Normal mood and affect. speech and behavior is normal. Cognition and memory are normal.   Assessment/Plan Acute on chronic systolic and diastolic heart  failure Extreme obesity Hypertension Obstructive sleep apnea/obesity hypoventilation syndrome  Chronic Kidney Disease, II  Admit/Oxygen, diuresis  KADAKIA,AJAY S, MD  11/22/2014, 4:15 PM

## 2014-11-22 NOTE — ED Notes (Signed)
He c/o SOB and BLE swelling increasingly worse over past few day. Denies pain. SOB increased with exertion. A&Ox4, able to speak in full sentences now

## 2014-11-22 NOTE — ED Provider Notes (Signed)
CSN: [id number]     Arrival date & time 11/22/14  1345 History   First MD Initiated Contact with Patient 11/22/14 1458     Chief Complaint  Patient presents with  . Shortness of Breath     (Consider location/radiation/quality/duration/timing/severity/associated sxs/prior Treatment) HPI  54 year old male with a history of CHF presents with dyspnea on exertion and bilateral lower extremity edema over the last 3-4 days. Similar to prior CHF exacerbations. When he walks he also gets a "pain" to his left axillary chest. He states he always gets this type of pain whenever he has to much fluid. Patient denies any pain or shortness of breath at rest. Patient noted to be hypoxic to 76% on room air on arrival. Patient denies any pleuritic pain. No cough or fever. Has been taking his Lasix as instructed, last took it this morning.  Past Medical History  Diagnosis Date  . Hypertrophy of tonsils alone   . Chronic combined systolic and diastolic heart failure   . Obesity, unspecified   . Obstructive sleep apnea (adult) (pediatric)   . Hypertension   . CHF (congestive heart failure)    Past Surgical History  Procedure Laterality Date  . Umbilical hernia repair     Family History  Problem Relation Age of Onset  . Diabetes Brother   . Hypertension Mother   . Diabetes Sister   . Cancer Maternal Aunt    History  Substance Use Topics  . Smoking status: Never Smoker   . Smokeless tobacco: Never Used  . Alcohol Use: No    Review of Systems  Constitutional: Negative for fever.  Respiratory: Positive for shortness of breath. Negative for cough.   Cardiovascular: Positive for chest pain and leg swelling.  All other systems reviewed and are negative.     Allergies  Review of patient's allergies indicates no known allergies.  Home Medications   Prior to Admission medications   Medication Sig Start Date End Date Taking? Authorizing Provider  amLODipine (NORVASC) 10 MG tablet Take 1 tablet  (10 mg total) by mouth daily. 11/11/14   Lorayne Marek, MD  aspirin EC 81 MG tablet Take 81 mg by mouth daily.    Historical Provider, MD  atorvastatin (LIPITOR) 40 MG tablet Take 1 tablet (40 mg total) by mouth daily. 11/11/14   Lorayne Marek, MD  carvedilol (COREG) 6.25 MG tablet Take 1 tablet (6.25 mg total) by mouth 2 (two) times daily with a meal. 11/11/14   Lorayne Marek, MD  furosemide (LASIX) 40 MG tablet Take 1 tablet (40 mg total) by mouth 2 (two) times daily. 11/11/14   Lorayne Marek, MD  lisinopril (PRINIVIL,ZESTRIL) 40 MG tablet Take 1 tablet (40 mg total) by mouth daily. 11/11/14   Lorayne Marek, MD  nitroGLYCERIN (NITROSTAT) 0.4 MG SL tablet Place 1 tablet (0.4 mg total) under the tongue every 5 (five) minutes x 3 doses as needed for chest pain. 12/17/12   Dixie Dials, MD  potassium chloride SA (K-DUR,KLOR-CON) 20 MEQ tablet Take 1 tablet (20 mEq total) by mouth daily. 11/11/14   Lorayne Marek, MD   BP 106/92 mmHg  Pulse 70  Temp(Src) 98.5 F (36.9 C) (Oral)  Resp 18  Ht 5\' 11"  (1.803 m)  SpO2 93% Physical Exam  Constitutional: He is oriented to person, place, and time. He appears well-developed and well-nourished. No distress.  Morbidly obese  HENT:  Head: Normocephalic and atraumatic.  Right Ear: External ear normal.  Left Ear: External ear normal.  Nose:  Nose normal.  Eyes: Right eye exhibits no discharge. Left eye exhibits no discharge.  Neck: Neck supple.  Cardiovascular: Normal rate, regular rhythm, normal heart sounds and intact distal pulses.   Pulmonary/Chest: Effort normal. No accessory muscle usage. Tachypnea (mild) noted. No respiratory distress. He has rales (diffuse).  Abdominal: Soft. There is no tenderness.  Musculoskeletal: He exhibits edema (pitting edema to bilateral lower legs).  Neurological: He is alert and oriented to person, place, and time.  Skin: Skin is warm and dry. He is not diaphoretic.  Nursing note and vitals reviewed.   ED Course   Procedures (including critical care time) Labs Review Labs Reviewed  CBC - Abnormal; Notable for the following:    MCH 24.0 (*)    RDW 18.5 (*)    All other components within normal limits  BASIC METABOLIC PANEL - Abnormal; Notable for the following:    Chloride 98 (*)    Glucose, Bld 104 (*)    Creatinine, Ser 1.52 (*)    Calcium 8.1 (*)    GFR calc non Af Amer 51 (*)    GFR calc Af Amer 59 (*)    All other components within normal limits  BRAIN NATRIURETIC PEPTIDE  I-STAT TROPOININ, ED    Imaging Review Dg Chest 2 View  11/22/2014   CLINICAL DATA:  Congestive heart failure.  Hypertension.  EXAM: CHEST  2 VIEW  COMPARISON:  10/07/2014  FINDINGS: Right heart enlargement.  Elevation of the left hemidiaphragm.  Thoracic spondylosis. Subsegmental atelectasis along the left hemidiaphragm.  IMPRESSION: 1. Generalized low lung volumes but also with elevation of the left hemidiaphragm mild atelectasis along the left hemidiaphragm. 2. Enlarged right heart, causing the right heart border could be more prominent than the left. The left hemidiaphragmatic elevation also causes some shift of mediastinal structures to the right.   Electronically Signed   By: Van Clines M.D.   On: 11/22/2014 15:16     EKG Interpretation   Date/Time:  Tuesday November 22 2014 13:55:37 EDT Ventricular Rate:  78 PR Interval:  150 QRS Duration: 86 QT Interval:  384 QTC Calculation: 437 R Axis:   128 Text Interpretation:  Normal sinus rhythm Right axis deviation Abnormal  ECG No significant change since last tracing Confirmed by [name] ([id number]) on 11/22/2014 2:58:54 PM      MDM   Final diagnoses:  CHF exacerbation    Patient's presentation is consistent with a CHF exacerbation. Initially hypoxic, oxygen returned to normal with nasal cannula supplemental oxygen. Will need IV Lasix as well as supportive care and admission to the hospital. Cardiology consulted, Dr. Doylene Canard to  admit.    Sherwood Gambler, MD 11/22/14 [phone]

## 2014-11-22 NOTE — ED Notes (Signed)
IV team at bedside attempting IV for training for new member of team.

## 2014-11-22 NOTE — Progress Notes (Signed)
At 1745 pt arrived to floor via stretcher from the ED.  A&0x4.  Oriented to room.  Call bell at his side.  Instructed to call for assistance as needed.  Pt.  Verbalized understanding.  Will continue to monitor.  Karie Kirks, Therapist, sports.

## 2014-11-23 ENCOUNTER — Encounter (HOSPITAL_COMMUNITY): Payer: Self-pay | Admitting: *Deleted

## 2014-11-23 ENCOUNTER — Other Ambulatory Visit (HOSPITAL_COMMUNITY): Payer: Self-pay

## 2014-11-23 ENCOUNTER — Inpatient Hospital Stay (HOSPITAL_COMMUNITY): Payer: Medicaid Other

## 2014-11-23 LAB — BASIC METABOLIC PANEL
Anion gap: 5 (ref 5–15)
BUN: 12 mg/dL (ref 6–20)
CALCIUM: 8 mg/dL — AB (ref 8.9–10.3)
CO2: 38 mmol/L — ABNORMAL HIGH (ref 22–32)
CREATININE: 1.47 mg/dL — AB (ref 0.61–1.24)
Chloride: 94 mmol/L — ABNORMAL LOW (ref 101–111)
GFR calc Af Amer: >60 mL/min (ref >60)
GFR calc non Af Amer: 53 mL/min — ABNORMAL LOW (ref >60)
GLUCOSE: 84 mg/dL (ref 65–99)
Potassium: 4.2 mmol/L (ref 3.5–5.1)
Sodium: 137 mmol/L (ref 135–145)

## 2014-11-23 LAB — CBC WITH DIFFERENTIAL/PLATELET
BASOS ABS: 0 10*3/uL (ref 0.0–0.1)
Basophils Relative: 1 % (ref 0–1)
EOS PCT: 1 % (ref 0–5)
Eosinophils Absolute: 0 10*3/uL (ref 0.0–0.7)
HCT: 45 % (ref 39.0–52.0)
HEMOGLOBIN: 12.7 g/dL — AB (ref 13.0–17.0)
LYMPHS ABS: 1.2 10*3/uL (ref 0.7–4.0)
LYMPHS PCT: 24 % (ref 12–46)
MCH: 23.3 pg — ABNORMAL LOW (ref 26.0–34.0)
MCHC: 28.2 g/dL — ABNORMAL LOW (ref 30.0–36.0)
MCV: 82.4 fL (ref 78.0–100.0)
Monocytes Absolute: 0.8 10*3/uL (ref 0.1–1.0)
Monocytes Relative: 16 % — ABNORMAL HIGH (ref 3–12)
NEUTROS ABS: 3 10*3/uL (ref 1.7–7.7)
Neutrophils Relative %: 58 % (ref 43–77)
PLATELETS: 256 10*3/uL (ref 150–400)
RBC: 5.46 MIL/uL (ref 4.22–5.81)
RDW: 18.8 % — ABNORMAL HIGH (ref 11.5–15.5)
WBC: 5.1 10*3/uL (ref 4.0–10.5)

## 2014-11-23 LAB — TROPONIN I: Troponin I: <0.03 ng/mL (ref <0.031)

## 2014-11-23 MED ORDER — LISINOPRIL 10 MG PO TABS
10.0000 mg | ORAL_TABLET | Freq: Every day | ORAL | Status: DC
  Filled 2014-11-23: qty 1

## 2014-11-23 MED ORDER — CARVEDILOL 3.125 MG PO TABS
3.1250 mg | ORAL_TABLET | Freq: Two times a day (BID) | ORAL | Status: DC
  Administered 2014-11-23 – 2014-11-27 (×7): 3.125 mg via ORAL
  Filled 2014-11-23 (×11): qty 1

## 2014-11-23 MED ORDER — ALBUMIN HUMAN 25 % IV SOLN
12.5000 g | Freq: Once | INTRAVENOUS | Status: AC
  Administered 2014-11-23: 12.5 g via INTRAVENOUS
  Filled 2014-11-23: qty 50

## 2014-11-23 MED ORDER — AMLODIPINE BESYLATE 5 MG PO TABS
5.0000 mg | ORAL_TABLET | Freq: Every day | ORAL | Status: DC
  Filled 2014-11-23: qty 1

## 2014-11-23 MED ORDER — PERFLUTREN LIPID MICROSPHERE
1.0000 mL | INTRAVENOUS | Status: AC | PRN
  Administered 2014-11-23: 4 mL via INTRAVENOUS
  Filled 2014-11-23: qty 10

## 2014-11-23 NOTE — Progress Notes (Signed)
Ref: Lorayne Marek, MD   Subjective:  Feeling better. Less short of breath. Lower blood pressure. Afebrile.  Objective:  Vital Signs in the last 24 hours: Temp:  [98 F (36.7 C)-98.5 F (36.9 C)] 98.2 F (36.8 C) (06/29 1332) Pulse Rate:  [64-74] 72 (06/29 1332) Cardiac Rhythm:  [-] Normal sinus rhythm (06/28 1954) Resp:  [18-30] 18 (06/29 1332) BP: (95-148)/(45-92) 95/45 mmHg (06/29 1332) SpO2:  [76 %-100 %] 92 % (06/29 1332) Weight:  [169.192 kg (373 lb)-171.051 kg (377 lb 1.6 oz)] 169.65 kg (374 lb 0.2 oz) (06/29 0539)  Physical Exam: BP Readings from Last 1 Encounters:  11/23/14 95/45    Wt Readings from Last 1 Encounters:  11/23/14 169.65 kg (374 lb 0.2 oz)    Weight change:   HEENT: [place]/AT, Eyes-Brown, PERL, EOMI, Conjunctiva-Pink, Sclera-Non-icteric Neck: No JVD, No bruit, Trachea midline. Lungs:  Clear, Bilateral. Cardiac:  Regular rhythm, normal S1 and S2, no S3.  Abdomen:  Soft, non-tender. Extremities:  2 + edema present. No cyanosis. No clubbing. CNS: AxOx3, Cranial nerves grossly intact, moves all 4 extremities. Right handed. Skin: Warm and dry.   Intake/Output from previous day: 06/28 0701 - 06/29 0700 In: 360 [P.O.:360] Out: 1100 [Urine:1100]    Lab Results: BMET    Component Value Date/Time   NA 137 11/23/2014 1112   NA 136 11/22/2014 1404   NA 139 10/14/2014 1002   K 4.2 11/23/2014 1112   K 4.9 11/22/2014 1404   K 5.0 10/14/2014 1002   CL 94* 11/23/2014 1112   CL 98* 11/22/2014 1404   CL 99 10/14/2014 1002   CO2 38* 11/23/2014 1112   CO2 32 11/22/2014 1404   CO2 35* 10/14/2014 1002   GLUCOSE 84 11/23/2014 1112   GLUCOSE 104* 11/22/2014 1404   GLUCOSE 94 10/14/2014 1002   BUN 12 11/23/2014 1112   BUN 15 11/22/2014 1404   BUN 34* 10/14/2014 1002   CREATININE 1.47* 11/23/2014 1112   CREATININE 1.52* 11/22/2014 1404   CREATININE 1.41* 10/14/2014 1002   CREATININE 1.59* 10/12/2014 0348   CREATININE 1.23 07/27/2013 0909   CALCIUM 8.0*  11/23/2014 1112   CALCIUM 8.1* 11/22/2014 1404   CALCIUM 8.7 10/14/2014 1002   GFRNONAA 53* 11/23/2014 1112   GFRNONAA 51* 11/22/2014 1404   GFRNONAA 57* 10/14/2014 1002   GFRNONAA 48* 10/12/2014 0348   GFRNONAA 68 07/27/2013 0909   GFRAA >60 11/23/2014 1112   GFRAA 59* 11/22/2014 1404   GFRAA 66 10/14/2014 1002   GFRAA 56* 10/12/2014 0348   GFRAA 78 07/27/2013 0909   CBC    Component Value Date/Time   WBC 5.1 11/23/2014 0559   RBC 5.46 11/23/2014 0559   HGB 12.7* 11/23/2014 0559   HCT 45.0 11/23/2014 0559   PLT 256 11/23/2014 0559   MCV 82.4 11/23/2014 0559   MCH 23.3* 11/23/2014 0559   MCHC 28.2* 11/23/2014 0559   RDW 18.8* 11/23/2014 0559   LYMPHSABS 1.2 11/23/2014 0559   MONOABS 0.8 11/23/2014 0559   EOSABS 0.0 11/23/2014 0559   BASOSABS 0.0 11/23/2014 0559   HEPATIC Function Panel  Recent Labs  10/07/14 1051 10/14/14 1002  PROT 6.5 7.1   HEMOGLOBIN A1C No components found for: HGA1C,  MPG CARDIAC ENZYMES Lab Results  Component Value Date   TROPONINI <0.03 11/23/2014   TROPONINI <0.03 11/22/2014   TROPONINI <0.03 11/22/2014   BNP No results for input(s): PROBNP in the last 8760 hours. TSH No results for input(s): TSH in the last  8760 hours. CHOLESTEROL  Recent Labs  10/07/14 1547  CHOL 145    Scheduled Meds: . albumin human  12.5 g Intravenous Once  . [START ON 11/24/2014] amLODipine  5 mg Oral Daily  . aspirin EC  81 mg Oral Daily  . atorvastatin  40 mg Oral Daily  . carvedilol  3.125 mg Oral BID WC  . furosemide  40 mg Intravenous Q12H  . heparin  5,000 Units Subcutaneous 3 times per day  . [START ON 11/24/2014] lisinopril  10 mg Oral Daily  . potassium chloride SA  20 mEq Oral Daily  . sodium chloride  3 mL Intravenous Q12H   Continuous Infusions:  PRN Meds:.sodium chloride, acetaminophen, nitroGLYCERIN, ondansetron (ZOFRAN) IV, sodium chloride  Assessment/Plan: Acute on chronic systolic and diastolic heart failure Extreme  obesity Hypertension Obstructive sleep apnea/obesity hypoventilation syndrome  Chronic Kidney Disease, II   Decrease dose of antihypertensive by 50 %. If renal function deteriorates will stop Lisinopril.   LOS: 1 day    Dixie Dials  MD  11/23/2014, 1:51 PM

## 2014-11-23 NOTE — Care Management Note (Signed)
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 No medical insurance. Patient goes to the [facility] and [facility] for primary care service and he gets his prescriptions filled there also. Patient stated that he is trying to eat right, not exercising states that he is somewhat depressed- sits on the couch all day watching TV.Patient has scales at home and weighs himself everyday. CM encouraged patient to be a little more active- walking outside when possible.   Expected Discharge Date: possible 11/27/2014                 Expected Discharge Plan:  Home/Self Care  Discharge planning Services  CM Consult   Status of Service:  In process, will continue to follow   Sherrilyn Rist [id number] 11/23/2014, 2:08 PM

## 2014-11-23 NOTE — Progress Notes (Signed)
  Echocardiogram 2D Echocardiogram has been performed.  Adam Dennis 11/23/2014, 10:28 AM

## 2014-11-23 NOTE — Progress Notes (Signed)
Heart Failure Navigator Consult Note  Presentation: Adam Dennis is a 54 year old male with a history of CHF presents with dyspnea on exertion and bilateral lower extremity edema over the last 3-4 days. Similar to prior CHF exacerbations. When he walks he also gets a "pain" to his left axillary chest. He states he always gets this type of pain whenever he has to much fluid. Patient denies any pain or shortness of breath at rest. Patient noted to be hypoxic to 76% on room air on arrival. Patient denies any pleuritic pain. No cough or fever. Has been taking his Lasix as instructed, last took it this morning.    Past Medical History  Diagnosis Date  . Hypertrophy of tonsils alone   . Chronic combined systolic and diastolic heart failure   . Obesity, unspecified   . Obstructive sleep apnea (adult) (pediatric)   . Hypertension   . CHF (congestive heart failure)     History   Social History  . Marital Status: Single    Spouse Name: N/A  . Number of Children: 0  . Years of Education: N/A   Occupational History  . works as a Garment/textile technologist History Main Topics  . Smoking status: Never Smoker   . Smokeless tobacco: Never Used  . Alcohol Use: No  . Drug Use: No  . Sexual Activity: Not on file   Other Topics Concern  . None   Social History Narrative    ECHO:Study Conclusions--11/23/14  - Left ventricle: The cavity size was normal. Systolic function was mildly to moderately reduced. The estimated ejection fraction was in the range of 40% to 45%. Diffuse hypokinesis. Regional wall motion abnormalities cannot be excluded. Doppler parameters are consistent with abnormal left ventricular relaxation (grade 1 diastolic dysfunction). - Mitral valve: There was mild regurgitation. - Left atrium: The atrium was mildly dilated. - Right ventricle: The cavity size was mildly dilated. Wall thickness was normal. - Right atrium: The atrium was mildly dilated.  Transthoracic  echocardiography. M-mode, complete 2D, spectral Doppler, and color Doppler. Birthdate: Patient birthdate: 04-18-1961. Age: Patient is 54 yr old. Sex: Gender: male. BMI: 50.2 kg/m^2. Blood pressure:   138/85 Patient status: Inpatient. Study date: Study date: 11/23/2014. Study time: 08:51 AM. Location: Bedside.  BNP    Component Value Date/Time   BNP 273.2* 11/22/2014 1404    ProBNP    Component Value Date/Time   PROBNP 227.1* 11/12/2013 0953     Education Assessment and Provision:  Detailed education and instructions provided on heart failure disease management including the following:  Signs and symptoms of Heart Failure When to call the physician Importance of daily weights Low sodium diet Fluid restriction Medication management Anticipated future follow-up appointments  Patient education given on each of the above topics.  Patient acknowledges understanding and acceptance of all instructions.  I spoke briefly with Adam Dennis regarding his HF.  He was recently hospitalized for HF in May--discharged on May 18th.  He tells me that he has really been trying to eat low sodium.  We discussed a low sodium diet and high sodium foods to avoid.  He also says that he weighs daily--yet when he recognizes weight gains he does not always do anything with that information.  I have reviewed with him when to contact the physician regarding signs and symptoms of HF.  He denies any issues with getting or taking prescribed medications.  I have encouraged Adam Dennis to call me after discharge with specific issues  or concerns about his HF and HF recommendations.  The patient follows outpatient in [facility].       Education Materials:  "Living Better With Heart Failure" Booklet, Daily Weight Tracker Tool    High Risk Criteria for Readmission and/or Poor Patient Outcomes:   EF <30%- No 40-45% with grade 1 dias dys.  2 or more admissions in 6 months- Yes  Difficult social  situation- No  Demonstrates medication noncompliance- No   Barriers of Care:   Knowledge and compliance  Discharge Planning:   Plan to return to home with his girlfriend.

## 2014-11-24 LAB — BASIC METABOLIC PANEL
Anion gap: 7 (ref 5–15)
BUN: 18 mg/dL (ref 6–20)
CALCIUM: 7.8 mg/dL — AB (ref 8.9–10.3)
CO2: 35 mmol/L — AB (ref 22–32)
Chloride: 94 mmol/L — ABNORMAL LOW (ref 101–111)
Creatinine, Ser: 1.76 mg/dL — ABNORMAL HIGH (ref 0.61–1.24)
GFR calc Af Amer: 50 mL/min — ABNORMAL LOW (ref >60)
GFR calc non Af Amer: 43 mL/min — ABNORMAL LOW (ref >60)
Glucose, Bld: 105 mg/dL — ABNORMAL HIGH (ref 65–99)
POTASSIUM: 4.6 mmol/L (ref 3.5–5.1)
Sodium: 136 mmol/L (ref 135–145)

## 2014-11-24 MED ORDER — CALCIUM CARBONATE ANTACID 500 MG PO CHEW
1.0000 | CHEWABLE_TABLET | Freq: Three times a day (TID) | ORAL | Status: DC | PRN
  Filled 2014-11-24: qty 1

## 2014-11-24 MED ORDER — POTASSIUM CHLORIDE CRYS ER 10 MEQ PO TBCR
10.0000 meq | EXTENDED_RELEASE_TABLET | Freq: Every day | ORAL | Status: DC
  Administered 2014-11-24: 10 meq via ORAL
  Filled 2014-11-24 (×2): qty 1

## 2014-11-24 MED ORDER — LORAZEPAM 0.5 MG PO TABS
0.5000 mg | ORAL_TABLET | Freq: Two times a day (BID) | ORAL | Status: DC
  Administered 2014-11-24 – 2014-11-27 (×5): 0.5 mg via ORAL
  Filled 2014-11-24 (×6): qty 1

## 2014-11-24 NOTE — Progress Notes (Signed)
Ref: Lorayne Marek, MD   Subjective:  Feeling better except some tingling in hands, bilateral. Normal potassium level. Low calcium level..  Objective:  Vital Signs in the last 24 hours: Temp:  [98 F (36.7 C)-98.6 F (37 C)] 98.2 F (36.8 C) (06/30 0452) Pulse Rate:  [70-77] 77 (06/30 0452) Cardiac Rhythm:  [-] Normal sinus rhythm (06/29 2000) Resp:  [18] 18 (06/30 0452) BP: (95-118)/(41-64) 115/60 mmHg (06/30 0452) SpO2:  [92 %-97 %] 93 % (06/30 0452) Weight:  [168.466 kg (371 lb 6.4 oz)] 168.466 kg (371 lb 6.4 oz) (06/30 0452)  Physical Exam: BP Readings from Last 1 Encounters:  11/24/14 115/60    Wt Readings from Last 1 Encounters:  11/24/14 168.466 kg (371 lb 6.4 oz)    Weight change: -2.586 kg (-5 lb 11.2 oz)  HEENT: [place]/AT, Eyes-Brown, PERL, EOMI, Conjunctiva-Pink, Sclera-Non-icteric Neck: No JVD, No bruit, Trachea midline. Lungs:  Clear, Bilateral. Cardiac:  Regular rhythm, normal S1 and S2, no S3.  Abdomen:  Soft, non-tender. Extremities:  2 + edema present. No cyanosis. No clubbing. CNS: AxOx3, Cranial nerves grossly intact, moves all 4 extremities. Right handed. Skin: Warm and dry.   Intake/Output from previous day: 06/29 0701 - 06/30 0700 In: 1530 [P.O.:1480; IV Piggyback:50] Out: [id number] [Urine:1575]    Lab Results: BMET    Component Value Date/Time   NA 136 11/24/2014 0410   NA 137 11/23/2014 1112   NA 136 11/22/2014 1404   K 4.6 11/24/2014 0410   K 4.2 11/23/2014 1112   K 4.9 11/22/2014 1404   CL 94* 11/24/2014 0410   CL 94* 11/23/2014 1112   CL 98* 11/22/2014 1404   CO2 35* 11/24/2014 0410   CO2 38* 11/23/2014 1112   CO2 32 11/22/2014 1404   GLUCOSE 105* 11/24/2014 0410   GLUCOSE 84 11/23/2014 1112   GLUCOSE 104* 11/22/2014 1404   BUN 18 11/24/2014 0410   BUN 12 11/23/2014 1112   BUN 15 11/22/2014 1404   CREATININE 1.76* 11/24/2014 0410   CREATININE 1.47* 11/23/2014 1112   CREATININE 1.52* 11/22/2014 1404   CREATININE 1.41* 10/14/2014  1002   CREATININE 1.23 07/27/2013 0909   CALCIUM 7.8* 11/24/2014 0410   CALCIUM 8.0* 11/23/2014 1112   CALCIUM 8.1* 11/22/2014 1404   GFRNONAA 43* 11/24/2014 0410   GFRNONAA 53* 11/23/2014 1112   GFRNONAA 51* 11/22/2014 1404   GFRNONAA 57* 10/14/2014 1002   GFRNONAA 68 07/27/2013 0909   GFRAA 50* 11/24/2014 0410   GFRAA >60 11/23/2014 1112   GFRAA 59* 11/22/2014 1404   GFRAA 66 10/14/2014 1002   GFRAA 78 07/27/2013 0909   CBC    Component Value Date/Time   WBC 5.1 11/23/2014 0559   RBC 5.46 11/23/2014 0559   HGB 12.7* 11/23/2014 0559   HCT 45.0 11/23/2014 0559   PLT 256 11/23/2014 0559   MCV 82.4 11/23/2014 0559   MCH 23.3* 11/23/2014 0559   MCHC 28.2* 11/23/2014 0559   RDW 18.8* 11/23/2014 0559   LYMPHSABS 1.2 11/23/2014 0559   MONOABS 0.8 11/23/2014 0559   EOSABS 0.0 11/23/2014 0559   BASOSABS 0.0 11/23/2014 0559   HEPATIC Function Panel  Recent Labs  10/07/14 1051 10/14/14 1002  PROT 6.5 7.1   HEMOGLOBIN A1C No components found for: HGA1C,  MPG CARDIAC ENZYMES Lab Results  Component Value Date   TROPONINI <0.03 11/23/2014   TROPONINI <0.03 11/22/2014   TROPONINI <0.03 11/22/2014   BNP No results for input(s): PROBNP in the last 8760 hours. TSH  No results for input(s): TSH in the last 8760 hours. CHOLESTEROL  Recent Labs  10/07/14 1547  CHOL 145    Scheduled Meds: . aspirin EC  81 mg Oral Daily  . atorvastatin  40 mg Oral Daily  . carvedilol  3.125 mg Oral BID WC  . furosemide  40 mg Intravenous Q12H  . heparin  5,000 Units Subcutaneous 3 times per day  . potassium chloride SA  10 mEq Oral Daily  . sodium chloride  3 mL Intravenous Q12H   Continuous Infusions:  PRN Meds:.sodium chloride, acetaminophen, nitroGLYCERIN, ondansetron (ZOFRAN) IV, sodium chloride  Assessment/Plan: Acute on chronic systolic and diastolic heart failure Extreme obesity Hypertension Obstructive sleep apnea/obesity hypoventilation syndrome  Chronic Kidney  Disease, II Hypocalcemia  Decrease antihypertensive medications further and discontinue lisinopril.     LOS: 2 days    Dixie Dials  MD  11/24/2014, 11:06 AM

## 2014-11-25 ENCOUNTER — Inpatient Hospital Stay (HOSPITAL_COMMUNITY): Payer: Medicaid Other

## 2014-11-25 ENCOUNTER — Encounter (HOSPITAL_COMMUNITY): Payer: Self-pay

## 2014-11-25 LAB — COMPREHENSIVE METABOLIC PANEL
ALK PHOS: 39 U/L (ref 38–126)
ALT: 14 U/L — ABNORMAL LOW (ref 17–63)
AST: 16 U/L (ref 15–41)
Albumin: 2.5 g/dL — ABNORMAL LOW (ref 3.5–5.0)
Anion gap: 5 (ref 5–15)
BILIRUBIN TOTAL: 0.6 mg/dL (ref 0.3–1.2)
BUN: 11 mg/dL (ref 6–20)
CO2: 41 mmol/L — ABNORMAL HIGH (ref 22–32)
CREATININE: 1.33 mg/dL — AB (ref 0.61–1.24)
Calcium: 8.3 mg/dL — ABNORMAL LOW (ref 8.9–10.3)
Chloride: 94 mmol/L — ABNORMAL LOW (ref 101–111)
GFR, EST NON AFRICAN AMERICAN: 60 mL/min — AB (ref >60)
Glucose, Bld: 89 mg/dL (ref 65–99)
Potassium: 4.8 mmol/L (ref 3.5–5.1)
Sodium: 140 mmol/L (ref 135–145)
TOTAL PROTEIN: 6.7 g/dL (ref 6.5–8.1)

## 2014-11-25 NOTE — Progress Notes (Signed)
Ref: Lorayne Marek, MD   Subjective:  "I may have stroke". Both hands but right more than left has weakness and numbness.  Objective:  Vital Signs in the last 24 hours: Temp:  [98 F (36.7 C)-98.7 F (37.1 C)] 98 F (36.7 C) (07/01 1300) Pulse Rate:  [56-75] 56 (07/01 1700) Cardiac Rhythm:  [-] Normal sinus rhythm (07/01 1000) Resp:  [18-19] 18 (07/01 1300) BP: (102-140)/(48-83) 102/48 mmHg (07/01 1700) SpO2:  [81 %-97 %] 94 % (07/01 1300) Weight:  [166.697 kg (367 lb 8 oz)] 166.697 kg (367 lb 8 oz) (07/01 [id number])  Physical Exam: BP Readings from Last 1 Encounters:  11/25/14 102/48    Wt Readings from Last 1 Encounters:  11/25/14 166.697 kg (367 lb 8 oz)    Weight change: -1.769 kg (-3 lb 14.4 oz)  HEENT: [place]/AT, Eyes-Brown, PERL, EOMI, Conjunctiva-Pink, Sclera-Non-icteric Neck: No JVD, No bruit, Trachea midline. Lungs:  Clear, Bilateral. Cardiac:  Regular rhythm, normal S1 and S2, no S3.  Abdomen:  Soft, non-tender. Extremities:  2 + edema present. No cyanosis. No clubbing. CNS: AxOx3, Cranial nerves grossly intact, moves all 4 extremities. Right handed. Skin: Warm and dry.   Intake/Output from previous day: 06/30 0701 - 07/01 0700 In: 680 [P.O.:680] Out: 1100 [Urine:1100]    Lab Results: BMET    Component Value Date/Time   NA 140 11/25/2014 0416   NA 136 11/24/2014 0410   NA 137 11/23/2014 1112   K 4.8 11/25/2014 0416   K 4.6 11/24/2014 0410   K 4.2 11/23/2014 1112   CL 94* 11/25/2014 0416   CL 94* 11/24/2014 0410   CL 94* 11/23/2014 1112   CO2 41* 11/25/2014 0416   CO2 35* 11/24/2014 0410   CO2 38* 11/23/2014 1112   GLUCOSE 89 11/25/2014 0416   GLUCOSE 105* 11/24/2014 0410   GLUCOSE 84 11/23/2014 1112   BUN 11 11/25/2014 0416   BUN 18 11/24/2014 0410   BUN 12 11/23/2014 1112   CREATININE 1.33* 11/25/2014 0416   CREATININE 1.76* 11/24/2014 0410   CREATININE 1.47* 11/23/2014 1112   CREATININE 1.41* 10/14/2014 1002   CREATININE 1.23 07/27/2013 0909    CALCIUM 8.3* 11/25/2014 0416   CALCIUM 7.8* 11/24/2014 0410   CALCIUM 8.0* 11/23/2014 1112   GFRNONAA 60* 11/25/2014 0416   GFRNONAA 43* 11/24/2014 0410   GFRNONAA 53* 11/23/2014 1112   GFRNONAA 57* 10/14/2014 1002   GFRNONAA 68 07/27/2013 0909   GFRAA >60 11/25/2014 0416   GFRAA 50* 11/24/2014 0410   GFRAA >60 11/23/2014 1112   GFRAA 66 10/14/2014 1002   GFRAA 78 07/27/2013 0909   CBC    Component Value Date/Time   WBC 5.1 11/23/2014 0559   RBC 5.46 11/23/2014 0559   HGB 12.7* 11/23/2014 0559   HCT 45.0 11/23/2014 0559   PLT 256 11/23/2014 0559   MCV 82.4 11/23/2014 0559   MCH 23.3* 11/23/2014 0559   MCHC 28.2* 11/23/2014 0559   RDW 18.8* 11/23/2014 0559   LYMPHSABS 1.2 11/23/2014 0559   MONOABS 0.8 11/23/2014 0559   EOSABS 0.0 11/23/2014 0559   BASOSABS 0.0 11/23/2014 0559   HEPATIC Function Panel  Recent Labs  10/07/14 1051 10/14/14 1002 11/25/14 0416  PROT 6.5 7.1 6.7   HEMOGLOBIN A1C No components found for: HGA1C,  MPG CARDIAC ENZYMES Lab Results  Component Value Date   TROPONINI <0.03 11/23/2014   TROPONINI <0.03 11/22/2014   TROPONINI <0.03 11/22/2014   BNP No results for input(s): PROBNP in the last 8760  hours. TSH No results for input(s): TSH in the last 8760 hours. CHOLESTEROL  Recent Labs  10/07/14 1547  CHOL 145    Scheduled Meds: . aspirin EC  81 mg Oral Daily  . atorvastatin  40 mg Oral Daily  . carvedilol  3.125 mg Oral BID WC  . furosemide  40 mg Intravenous Q12H  . heparin  5,000 Units Subcutaneous 3 times per day  . LORazepam  0.5 mg Oral BID  . sodium chloride  3 mL Intravenous Q12H   Continuous Infusions:  PRN Meds:.sodium chloride, acetaminophen, calcium carbonate, nitroGLYCERIN, ondansetron (ZOFRAN) IV, sodium chloride  Assessment/Plan: Acute on chronic systolic and diastolic heart failure Extreme obesity Hypertension Obstructive sleep apnea/obesity hypoventilation syndrome  Chronic Kidney Disease,  II Hypocalcemia  CT brain.     LOS: 3 days    Dixie Dials  MD  11/25/2014, 6:16 PM

## 2014-11-25 NOTE — Progress Notes (Signed)
On 11/24/14 at 1815 pt c/o bil. Arm tremors when trying to reach for something.  Informed Dr. Doylene Canard and gave a phone order for Ativan 0.5mg  po BID to start now. Pt with some relief this am. Will continue to monitor.  Jakobi Thetford,RN.

## 2014-11-25 NOTE — Progress Notes (Addendum)
Pt given AM dose of lasix and subQ Heparin. Pt c/o increased tremors/jumpy feeling in hands this AM. Pt also states he felt unsteady this AM when going to bathroom. Pt states he has had weakness in his right leg the past few days. Neuro assessment WDL except for tremors and right leg weakness. VSS. Pt states he just "feels different" and he is not as happy as he normally is. Pt educated to remain in bed and to call RN if need to ambulate in room d/t unsteadiness. Pt given emotional support. Dr. Doylene Canard paged. [phone]  Spoke with Dr. Doylene Canard, MD notified of neuro assessment and that pt already received Lasix this AM as well as subQ heparin. CMET in process. No new orders at this time. MD to see patient in AM. Will continue to monitor.0600  Pt concerned stating he still feels different and appears anxious, Rapid response RN called to come take a look at patient. When RN assessed pt, pt sleeping in bed, no tremors or jumpiness noted while patient sleeping. 6:47 AM  Ronnette Hila, RN

## 2014-11-26 LAB — BASIC METABOLIC PANEL
Anion gap: 6 (ref 5–15)
BUN: 12 mg/dL (ref 6–20)
CALCIUM: 8 mg/dL — AB (ref 8.9–10.3)
CHLORIDE: 91 mmol/L — AB (ref 101–111)
CO2: 41 mmol/L — ABNORMAL HIGH (ref 22–32)
CREATININE: 1.3 mg/dL — AB (ref 0.61–1.24)
Glucose, Bld: 100 mg/dL — ABNORMAL HIGH (ref 65–99)
POTASSIUM: 4.1 mmol/L (ref 3.5–5.1)
Sodium: 138 mmol/L (ref 135–145)

## 2014-11-26 MED ORDER — ALBUTEROL SULFATE HFA 108 (90 BASE) MCG/ACT IN AERS: 2.0000 | INHALATION_SPRAY | Freq: Four times a day (QID) | RESPIRATORY_TRACT | Status: DC

## 2014-11-26 MED ORDER — ALBUTEROL SULFATE (2.5 MG/3ML) 0.083% IN NEBU: 2.5000 mg | INHALATION_SOLUTION | RESPIRATORY_TRACT | Status: DC | PRN

## 2014-11-26 MED ORDER — ALBUTEROL SULFATE (2.5 MG/3ML) 0.083% IN NEBU
2.5000 mg | INHALATION_SOLUTION | Freq: Four times a day (QID) | RESPIRATORY_TRACT | Status: DC
  Administered 2014-11-26: 2.5 mg via RESPIRATORY_TRACT
  Filled 2014-11-26: qty 3

## 2014-11-26 NOTE — Progress Notes (Signed)
Ref: Lorayne Marek, MD   Subjective:  CT brain positive for small vessel disease. Hypoxic without oxygen. Afebrile.  Objective:  Vital Signs in the last 24 hours: Temp:  [98 F (36.7 C)-98.6 F (37 C)] 98 F (36.7 C) (07/02 1431) Pulse Rate:  [56-96] 96 (07/02 1431) Cardiac Rhythm:  [-] Normal sinus rhythm (07/01 1954) Resp:  [16-20] 20 (07/02 1431) BP: (102-142)/(48-64) 123/55 mmHg (07/02 1431) SpO2:  [75 %-100 %] 88 % (07/02 1431) Weight:  [164.429 kg (362 lb 8 oz)] 164.429 kg (362 lb 8 oz) (07/02 0546)  Physical Exam: BP Readings from Last 1 Encounters:  11/26/14 123/55    Wt Readings from Last 1 Encounters:  11/26/14 164.429 kg (362 lb 8 oz)    Weight change: -2.268 kg (-5 lb)  HEENT: [place]/AT, Eyes-Brown, PERL, EOMI, Conjunctiva-Pink, Sclera-Non-icteric Neck: No JVD, No bruit, Trachea midline. Lungs:  Clear, Bilateral. Cardiac:  Regular rhythm, normal S1 and S2, no S3.  Abdomen:  Soft, non-tender. Extremities:  1 + edema present. No cyanosis. No clubbing. CNS: AxOx3, Cranial nerves grossly intact, moves all 4 extremities. Right handed. Skin: Warm and dry.   Intake/Output from previous day: 07/01 0701 - 07/02 0700 In: 960 [P.O.:960] Out: 2175 [Urine:2175]    Lab Results: BMET    Component Value Date/Time   NA 138 11/26/2014 0256   NA 140 11/25/2014 0416   NA 136 11/24/2014 0410   K 4.1 11/26/2014 0256   K 4.8 11/25/2014 0416   K 4.6 11/24/2014 0410   CL 91* 11/26/2014 0256   CL 94* 11/25/2014 0416   CL 94* 11/24/2014 0410   CO2 41* 11/26/2014 0256   CO2 41* 11/25/2014 0416   CO2 35* 11/24/2014 0410   GLUCOSE 100* 11/26/2014 0256   GLUCOSE 89 11/25/2014 0416   GLUCOSE 105* 11/24/2014 0410   BUN 12 11/26/2014 0256   BUN 11 11/25/2014 0416   BUN 18 11/24/2014 0410   CREATININE 1.30* 11/26/2014 0256   CREATININE 1.33* 11/25/2014 0416   CREATININE 1.76* 11/24/2014 0410   CREATININE 1.41* 10/14/2014 1002   CREATININE 1.23 07/27/2013 0909   CALCIUM 8.0*  11/26/2014 0256   CALCIUM 8.3* 11/25/2014 0416   CALCIUM 7.8* 11/24/2014 0410   GFRNONAA >60 11/26/2014 0256   GFRNONAA 60* 11/25/2014 0416   GFRNONAA 43* 11/24/2014 0410   GFRNONAA 57* 10/14/2014 1002   GFRNONAA 68 07/27/2013 0909   GFRAA >60 11/26/2014 0256   GFRAA >60 11/25/2014 0416   GFRAA 50* 11/24/2014 0410   GFRAA 66 10/14/2014 1002   GFRAA 78 07/27/2013 0909   CBC    Component Value Date/Time   WBC 5.1 11/23/2014 0559   RBC 5.46 11/23/2014 0559   HGB 12.7* 11/23/2014 0559   HCT 45.0 11/23/2014 0559   PLT 256 11/23/2014 0559   MCV 82.4 11/23/2014 0559   MCH 23.3* 11/23/2014 0559   MCHC 28.2* 11/23/2014 0559   RDW 18.8* 11/23/2014 0559   LYMPHSABS 1.2 11/23/2014 0559   MONOABS 0.8 11/23/2014 0559   EOSABS 0.0 11/23/2014 0559   BASOSABS 0.0 11/23/2014 0559   HEPATIC Function Panel  Recent Labs  10/07/14 1051 10/14/14 1002 11/25/14 0416  PROT 6.5 7.1 6.7   HEMOGLOBIN A1C No components found for: HGA1C,  MPG CARDIAC ENZYMES Lab Results  Component Value Date   TROPONINI <0.03 11/23/2014   TROPONINI <0.03 11/22/2014   TROPONINI <0.03 11/22/2014   BNP No results for input(s): PROBNP in the last 8760 hours. TSH No results for input(s):  TSH in the last 8760 hours. CHOLESTEROL  Recent Labs  10/07/14 1547  CHOL 145    Scheduled Meds: . aspirin EC  81 mg Oral Daily  . atorvastatin  40 mg Oral Daily  . carvedilol  3.125 mg Oral BID WC  . furosemide  40 mg Intravenous Q12H  . heparin  5,000 Units Subcutaneous 3 times per day  . LORazepam  0.5 mg Oral BID  . sodium chloride  3 mL Intravenous Q12H   Continuous Infusions:  PRN Meds:.sodium chloride, acetaminophen, albuterol, calcium carbonate, nitroGLYCERIN, ondansetron (ZOFRAN) IV, sodium chloride  Assessment/Plan: Acute on chronic systolic and diastolic heart failure Extreme obesity Hypertension Obstructive sleep apnea/obesity hypoventilation syndrome  Chronic Kidney Disease, II Hypocalcemia    Albuterol MDI, Resume oxygen   LOS: 4 days    Dixie Dials  MD  11/26/2014, 2:59 PM

## 2014-11-27 MED ORDER — CARVEDILOL 3.125 MG PO TABS: 3.1250 mg | ORAL_TABLET | Freq: Two times a day (BID) | ORAL | Status: DC

## 2014-11-27 MED ORDER — POTASSIUM CHLORIDE CRYS ER 10 MEQ PO TBCR: 10.0000 meq | EXTENDED_RELEASE_TABLET | Freq: Every day | ORAL | Status: DC

## 2014-11-27 MED ORDER — LISINOPRIL 5 MG PO TABS: 5.0000 mg | ORAL_TABLET | Freq: Every day | ORAL | Status: DC

## 2014-11-27 MED ORDER — CALCIUM CARBONATE ANTACID 500 MG PO CHEW: 1.0000 | CHEWABLE_TABLET | Freq: Two times a day (BID) | ORAL | Status: DC

## 2014-11-27 NOTE — Care Management Note (Addendum)
Case Management Note  Patient Details  Name: Adam Dennis MRN: [id number] Date of Birth: 06/25/1960  Subjective/Objective:                  dyspnea on exertion  Action/Plan: Discharge planning  Expected Discharge Date:  11/27/14               Expected Discharge Plan:  Home/Self Care  In-House Referral:     Discharge planning Services  CM Consult  Post Acute Care Choice:    Choice offered to:  NA  DME Arranged:  Oxygen DME Agency:  [place]:    [facility] Agency:     Status of Service:  Completed, signed off  Medicare Important Message Given:    Date Medicare IM Given:    Medicare IM give by:    Date Additional Medicare IM Given:    Additional Medicare Important Message give by:     If discussed at [place] of Stay Meetings, dates discussed:    Additional Comments: CM met with pt in room who states he has MEDICAID but has no card.  Pt has HHRN/PT ordered.  He will not receive HHPT bc he does not meed Medicaid criteria.  Referral given to [facility] rep, tiffany with SSN and once approved [facility] DME rep, Jeneen Rinks will deliver O2 to room. No other Cm needs were communicated. Dellie Catholic, RN 11/27/2014, 12:14 PM

## 2014-11-27 NOTE — Progress Notes (Signed)
SATURATION QUALIFICATIONS: (This note is used to comply with regulatory documentation for home oxygen)  Patient Saturations on Room Air at Rest = 86%  Patient Saturations on Room Air while Ambulating = 75%  Patient Saturations on 4 Liters of oxygen while Ambulating =96 %  Please briefly explain why patient needs home oxygen: MD made aware.

## 2014-11-27 NOTE — Discharge Summary (Signed)
Physician Discharge Summary  Patient ID: Adam Dennis MRN: [id number] DOB/AGE: [birth year] 54 y.o.  Admit date: 11/22/2014 Discharge date: 11/27/2014  Admission Diagnoses: Acute on chronic systolic and diastolic heart failure Extreme obesity Hypertension Obstructive sleep apnea/obesity hypoventilation syndrome  Chronic Kidney Disease, II Hypocalcemia  Discharge Diagnoses:  Principle Problem: * Acute on chronic left systolic heart failure * Extreme obesity Hypertension Obstructive sleep apnea/obesity hypoventilation syndrome  Chronic Kidney Disease, II Hypocalcemia  Discharged Condition: fair  Hospital Course: 54 year old male with a history of CHF presents with dyspnea on exertion and bilateral lower extremity edema over the last 3-4 days. Similar to prior CHF exacerbations. When he walks he also gets a "pain" to his left axillary chest. He states he always gets this type of pain whenever he has too much fluid. Patient denies any pain or shortness of breath at rest. Patient noted to be hypoxic to 76% on room air on arrival. Patient denies any pleuritic pain. No cough or fever. Has been taking his Lasix as instructed, last took it this morning. He had slow and steady diuresis with IV lasix. His hypoxemia continued, hence he was discharged home with 24 hour home oxygen use and follow up in 1 week.  Consults: cardiology  Significant Diagnostic Studies: labs: Normal CBC, BMET with mildly elevated BUN/Cr of 15/`1.52 and calcium of 8.1. Improving BNP of 273.2 compared to 574.2 a month ago.  Chest X-ray: 1. Generalized low lung volumes but also with elevation of the left hemidiaphragm mild atelectasis along the left hemidiaphragm. 2. Enlarged right heart, causing the right heart border could be more prominent than the left. The left hemidiaphragmatic elevation also causes some shift of mediastinal structures to the right.  Echocardiogram: Left ventricle: The cavity size was normal.  Systolic function was mildly to moderately reduced. The estimated ejection fraction was in the range of 40% to 45%. Diffuse hypokinesis. Regional wall motion abnormalities cannot be excluded. Doppler parameters are consistent with abnormal left ventricular relaxation (grade 1 diastolic dysfunction). - Mitral valve: There was mild regurgitation. - Left atrium: The atrium was mildly dilated. - Right ventricle: The cavity size was mildly dilated. Wall thickness was normal. - Right atrium: The atrium was mildly dilated.  Treatments: cardiac meds: lisinopril (Prinivil), aspirin, atorvastatin, carvedilol, furosemide and potassium and Calcium supplement(TUMs)  Discharge Exam: Blood pressure 142/57, pulse 68, temperature 98.2 F (36.8 C), temperature source Oral, resp. rate 18, height 5' 11.5" (1.816 m), weight 159.984 kg (352 lb 11.2 oz), SpO2 98 %. Gen: Well built with extreme obesity HEENT: [place]/AT, Eyes-Brown, PERL, EOMI, Conjunctiva-Pink, Sclera-Non-icteric Neck: No JVD, No bruit, Trachea midline. Lungs: Clear, Bilateral. Cardiac: Regular rhythm, normal S1 and S2, no S3.  Abdomen: Soft, non-tender. Extremities: 1 + edema present. No cyanosis. No clubbing. CNS: AxOx3, Cranial nerves grossly intact, moves all 4 extremities. Right handed. Skin: Warm and dry.  Disposition: 01-Home or Self Care     Medication List    STOP taking these medications        amLODipine 10 MG tablet  Commonly known as:  NORVASC      TAKE these medications        aspirin EC 81 MG tablet  Take 81 mg by mouth daily.     atorvastatin 40 MG tablet  Commonly known as:  LIPITOR  Take 1 tablet (40 mg total) by mouth daily.     calcium carbonate 500 MG chewable tablet  Commonly known as:  TUMS - dosed in mg elemental calcium  Chew 1 tablet (200 mg of elemental calcium total) by mouth 2 (two) times daily.     carvedilol 3.125 MG tablet  Commonly known as:  COREG  Take 1 tablet (3.125 mg total) by  mouth 2 (two) times daily with a meal.     furosemide 40 MG tablet  Commonly known as:  LASIX  Take 1 tablet (40 mg total) by mouth 2 (two) times daily.     lisinopril 5 MG tablet  Commonly known as:  PRINIVIL,ZESTRIL  Take 1 tablet (5 mg total) by mouth daily.     nitroGLYCERIN 0.4 MG SL tablet  Commonly known as:  NITROSTAT  Place 1 tablet (0.4 mg total) under the tongue every 5 (five) minutes x 3 doses as needed for chest pain.     potassium chloride 10 MEQ tablet  Commonly known as:  K-DUR,KLOR-CON  Take 1 tablet (10 mEq total) by mouth daily.           Follow-up Information    Follow up with Lorayne Marek, MD. Schedule an appointment as soon as possible for a visit in 1 month.   Specialty:  Internal Medicine   Contact information:   [place] [address] [phone]       Follow up with [facility] S, MD. Schedule an appointment as soon as possible for a visit in 1 week.   Specialty:  Cardiology   Contact information:   [place] [address] [phone]       Signed: Birdie Riddle 11/27/2014, 12:02 PM

## 2014-11-27 NOTE — Progress Notes (Signed)
Pt given discharge instructions IV and Tele removed. Questions answered home O2 supplied to patient from advance placed on patient. Taken down via wheelchair.

## 2014-12-12 ENCOUNTER — Ambulatory Visit: Payer: Medicaid Other | Attending: Internal Medicine | Admitting: Internal Medicine

## 2014-12-12 ENCOUNTER — Encounter: Payer: Self-pay | Admitting: Internal Medicine

## 2014-12-12 VITALS — BP 138/83 | HR 76 | Temp 98.0°F | Resp 12 | Wt 361.6 lb

## 2014-12-12 DIAGNOSIS — I5022 Chronic systolic (congestive) heart failure: Secondary | ICD-10-CM | POA: Diagnosis not present

## 2014-12-12 DIAGNOSIS — Z7982 Long term (current) use of aspirin: Secondary | ICD-10-CM | POA: Insufficient documentation

## 2014-12-12 DIAGNOSIS — G4733 Obstructive sleep apnea (adult) (pediatric): Secondary | ICD-10-CM | POA: Insufficient documentation

## 2014-12-12 DIAGNOSIS — I10 Essential (primary) hypertension: Secondary | ICD-10-CM | POA: Diagnosis not present

## 2014-12-12 DIAGNOSIS — Z79899 Other long term (current) drug therapy: Secondary | ICD-10-CM | POA: Insufficient documentation

## 2014-12-12 DIAGNOSIS — N289 Disorder of kidney and ureter, unspecified: Secondary | ICD-10-CM | POA: Diagnosis not present

## 2014-12-12 DIAGNOSIS — R0902 Hypoxemia: Secondary | ICD-10-CM | POA: Diagnosis not present

## 2014-12-12 DIAGNOSIS — Z9981 Dependence on supplemental oxygen: Secondary | ICD-10-CM | POA: Diagnosis not present

## 2014-12-12 LAB — COMPLETE METABOLIC PANEL WITH GFR
ALBUMIN: 3.1 g/dL — AB (ref 3.5–5.2)
ALK PHOS: 32 U/L — AB (ref 39–117)
ALT: 12 U/L (ref 0–53)
AST: 18 U/L (ref 0–37)
BILIRUBIN TOTAL: 0.7 mg/dL (ref 0.2–1.2)
BUN: 15 mg/dL (ref 6–23)
CO2: 35 meq/L — AB (ref 19–32)
Calcium: 8.8 mg/dL (ref 8.4–10.5)
Chloride: 100 mEq/L (ref 96–112)
Creat: 1.37 mg/dL — ABNORMAL HIGH (ref 0.50–1.35)
GFR, EST NON AFRICAN AMERICAN: 59 mL/min — AB
GFR, Est African American: 68 mL/min
Glucose, Bld: 89 mg/dL (ref 70–99)
POTASSIUM: 4.6 meq/L (ref 3.5–5.3)
Sodium: 143 mEq/L (ref 135–145)
Total Protein: 7.2 g/dL (ref 6.0–8.3)

## 2014-12-12 NOTE — Progress Notes (Signed)
MRN: [id number] Name: Adam Dennis  Sex: male Age: 54 y.o. DOB: 1961/03/14  Allergies: Review of patient's allergies indicates no known allergies.  Chief Complaint  Patient presents with  . Hospitalization Follow-up    HPI: Patient is 54 y.o. male who has history of CHF, recently hospitalized with symptoms of exertional shortness of breath lower extremity edema, EMR reviewed, was given diuresis with IV Lasix, patient was also hypoxemic and was discharged on home oxygen, his echocardiogram reported EF of 40-45% with diffuse hypokinesis, he was discharged on oral Lasix  with potassium supplement, was advised to follow with cardiology which she already has scheduled appointment today, patient has not brought his oxygen tank his saturation was dropping, he was given 2 oxygen his saturation improved to 96% his blood pressure is 138/83 patient denies any headache dizziness chest and shortness of breath, patient denies any orthopnea PND he reports improvement in the leg swelling.  Past Medical History  Diagnosis Date  . Hypertrophy of tonsils alone   . Chronic combined systolic and diastolic heart failure   . Obesity, unspecified   . Obstructive sleep apnea (adult) (pediatric)   . Hypertension   . CHF (congestive heart failure)     Past Surgical History  Procedure Laterality Date  . Umbilical hernia repair        Medication List       This list is accurate as of: 12/12/14 10:31 AM.  Always use your most recent med list.               aspirin EC 81 MG tablet  Take 81 mg by mouth daily.     atorvastatin 40 MG tablet  Commonly known as:  LIPITOR  Take 1 tablet (40 mg total) by mouth daily.     calcium carbonate 500 MG chewable tablet  Commonly known as:  TUMS - dosed in mg elemental calcium  Chew 1 tablet (200 mg of elemental calcium total) by mouth 2 (two) times daily.     carvedilol 3.125 MG tablet  Commonly known as:  COREG  Take 1 tablet (3.125 mg total) by mouth 2  (two) times daily with a meal.     furosemide 40 MG tablet  Commonly known as:  LASIX  Take 1 tablet (40 mg total) by mouth 2 (two) times daily.     lisinopril 5 MG tablet  Commonly known as:  PRINIVIL,ZESTRIL  Take 1 tablet (5 mg total) by mouth daily.     nitroGLYCERIN 0.4 MG SL tablet  Commonly known as:  NITROSTAT  Place 1 tablet (0.4 mg total) under the tongue every 5 (five) minutes x 3 doses as needed for chest pain.     potassium chloride 10 MEQ tablet  Commonly known as:  K-DUR,KLOR-CON  Take 1 tablet (10 mEq total) by mouth daily.        No orders of the defined types were placed in this encounter.    Immunization History  Administered Date(s) Administered  . Influenza Whole 06/02/2009  . Influenza,inj,Quad PF,36+ Mos 02/15/2013  . Pneumococcal Polysaccharide-23 12/13/2012    Family History  Problem Relation Age of Onset  . Diabetes Brother   . Hypertension Mother   . Diabetes Sister   . Cancer Maternal Aunt     History  Substance Use Topics  . Smoking status: Never Smoker   . Smokeless tobacco: Never Used  . Alcohol Use: No    Review of Systems   As noted in  HPI  Filed Vitals:   12/12/14 0958  BP: 138/83  Pulse: 76  Temp:   Resp: 12    Physical Exam  Physical Exam  Constitutional:  Currently patient is on continuous oxygen not in acute respiratory distress  Eyes: EOM are normal. Pupils are equal, round, and reactive to light.  Cardiovascular: Normal rate and regular rhythm.   Pulmonary/Chest: Breath sounds normal. No respiratory distress. He has no wheezes. He has no rales.  Abdominal: Soft. There is no tenderness.  Musculoskeletal: He exhibits no edema.    CBC    Component Value Date/Time   WBC 5.1 11/23/2014 0559   RBC 5.46 11/23/2014 0559   HGB 12.7* 11/23/2014 0559   HCT 45.0 11/23/2014 0559   PLT 256 11/23/2014 0559   MCV 82.4 11/23/2014 0559   LYMPHSABS 1.2 11/23/2014 0559   MONOABS 0.8 11/23/2014 0559   EOSABS 0.0  11/23/2014 0559   BASOSABS 0.0 11/23/2014 0559    CMP     Component Value Date/Time   NA 138 11/26/2014 0256   K 4.1 11/26/2014 0256   CL 91* 11/26/2014 0256   CO2 41* 11/26/2014 0256   GLUCOSE 100* 11/26/2014 0256   BUN 12 11/26/2014 0256   CREATININE 1.30* 11/26/2014 0256   CREATININE 1.41* 10/14/2014 1002   CALCIUM 8.0* 11/26/2014 0256   PROT 6.7 11/25/2014 0416   ALBUMIN 2.5* 11/25/2014 0416   AST 16 11/25/2014 0416   ALT 14* 11/25/2014 0416   ALKPHOS 39 11/25/2014 0416   BILITOT 0.6 11/25/2014 0416   GFRNONAA >60 11/26/2014 0256   GFRNONAA 57* 10/14/2014 1002   GFRAA >60 11/26/2014 0256   GFRAA 66 10/14/2014 1002    Lab Results  Component Value Date/Time   CHOL 145 10/07/2014 03:47 PM    Lab Results  Component Value Date/Time   HGBA1C 6.7* 10/07/2014 03:47 PM    Lab Results  Component Value Date/Time   AST 16 11/25/2014 04:16 AM    Assessment and Plan  Essential hypertension, benign - Plan: advised patient for DASH diet, continue with current meds, repeat blood chemistry COMPLETE METABOLIC PANEL WITH GFR  Chronic systolic congestive heart failure/hypoxia - Plan: currently patient is on Coreg, lisinopril, Lipitor, Lasix, saturating 96% on 2 L oxygen , patient to follow with his cardiologist today  COMPLETE METABOLIC PANEL WITH GFR  Renal insufficiency - Plan:will repeat blood chemistry today COMPLETE METABOLIC PANEL WITH GFR    Return in about 3 months (around 03/14/2015), or if symptoms worsen or fail to improve.   This note has been created with Surveyor, quantity. Any transcriptional errors are unintentional.    Lorayne Marek, MD

## 2014-12-12 NOTE — Patient Instructions (Signed)
DASH Eating Plan °DASH stands for "Dietary Approaches to Stop Hypertension." The DASH eating plan is a healthy eating plan that has been shown to reduce high blood pressure (hypertension). Additional health benefits may include reducing the risk of type 2 diabetes mellitus, heart disease, and stroke. The DASH eating plan may also help with weight loss. °WHAT DO I NEED TO KNOW ABOUT THE DASH EATING PLAN? °For the DASH eating plan, you will follow these general guidelines: °· Choose foods with a percent daily value for sodium of less than 5% (as listed on the food label). °· Use salt-free seasonings or herbs instead of table salt or sea salt. °· Check with your health care provider or pharmacist before using salt substitutes. °· Eat lower-sodium products, often labeled as "lower sodium" or "no salt added." °· Eat fresh foods. °· Eat more vegetables, fruits, and low-fat dairy products. °· Choose whole grains. Look for the word "whole" as the first word in the ingredient list. °· Choose fish and skinless chicken or turkey more often than red meat. Limit fish, poultry, and meat to 6 oz (170 g) each day. °· Limit sweets, desserts, sugars, and sugary drinks. °· Choose heart-healthy fats. °· Limit cheese to 1 oz (28 g) per day. °· Eat more home-cooked food and less restaurant, buffet, and fast food. °· Limit fried foods. °· Cook foods using methods other than frying. °· Limit canned vegetables. If you do use them, rinse them well to decrease the sodium. °· When eating at a restaurant, ask that your food be prepared with less salt, or no salt if possible. °WHAT FOODS CAN I EAT? °Seek help from a dietitian for individual calorie needs. °Grains °Whole grain or whole wheat bread. Brown rice. Whole grain or whole wheat pasta. Quinoa, bulgur, and whole grain cereals. Low-sodium cereals. Corn or whole wheat flour tortillas. Whole grain cornbread. Whole grain crackers. Low-sodium crackers. °Vegetables °Fresh or frozen vegetables  (raw, steamed, roasted, or grilled). Low-sodium or reduced-sodium tomato and vegetable juices. Low-sodium or reduced-sodium tomato sauce and paste. Low-sodium or reduced-sodium canned vegetables.  °Fruits °All fresh, canned (in natural juice), or frozen fruits. °Meat and Other Protein Products °Ground beef (85% or leaner), grass-fed beef, or beef trimmed of fat. Skinless chicken or turkey. Ground chicken or turkey. Pork trimmed of fat. All fish and seafood. Eggs. Dried beans, peas, or lentils. Unsalted nuts and seeds. Unsalted canned beans. °Dairy °Low-fat dairy products, such as skim or 1% milk, 2% or reduced-fat cheeses, low-fat ricotta or cottage cheese, or plain low-fat yogurt. Low-sodium or reduced-sodium cheeses. °Fats and Oils °Tub margarines without trans fats. Light or reduced-fat mayonnaise and salad dressings (reduced sodium). Avocado. Safflower, olive, or canola oils. Natural peanut or almond butter. °Other °Unsalted popcorn and pretzels. °The items listed above may not be a complete list of recommended foods or beverages. Contact your dietitian for more options. °WHAT FOODS ARE NOT RECOMMENDED? °Grains °White bread. White pasta. White rice. Refined cornbread. Bagels and croissants. Crackers that contain trans fat. °Vegetables °Creamed or fried vegetables. Vegetables in a cheese sauce. Regular canned vegetables. Regular canned tomato sauce and paste. Regular tomato and vegetable juices. °Fruits °Dried fruits. Canned fruit in light or heavy syrup. Fruit juice. °Meat and Other Protein Products °Fatty cuts of meat. Ribs, chicken wings, bacon, sausage, bologna, salami, chitterlings, fatback, hot dogs, bratwurst, and packaged luncheon meats. Salted nuts and seeds. Canned beans with salt. °Dairy °Whole or 2% milk, cream, half-and-half, and cream cheese. Whole-fat or sweetened yogurt. Full-fat   cheeses or blue cheese. Nondairy creamers and whipped toppings. Processed cheese, cheese spreads, or cheese  curds. °Condiments °Onion and garlic salt, seasoned salt, table salt, and sea salt. Canned and packaged gravies. Worcestershire sauce. Tartar sauce. Barbecue sauce. Teriyaki sauce. Soy sauce, including reduced sodium. Steak sauce. Fish sauce. Oyster sauce. Cocktail sauce. Horseradish. Ketchup and mustard. Meat flavorings and tenderizers. Bouillon cubes. Hot sauce. Tabasco sauce. Marinades. Taco seasonings. Relishes. °Fats and Oils °Butter, stick margarine, lard, shortening, ghee, and bacon fat. Coconut, palm kernel, or palm oils. Regular salad dressings. °Other °Pickles and olives. Salted popcorn and pretzels. °The items listed above may not be a complete list of foods and beverages to avoid. Contact your dietitian for more information. °WHERE CAN I FIND MORE INFORMATION? °National Heart, [employer], [employer] Institute: www.nhlbi.nih.gov/health/health-topics/topics/dash/ °Document Released: 05/02/2011 Document Revised: 09/27/2013 Document Reviewed: 03/17/2013 °[employer]® Patient Information ©2015 [employer]. This information is not intended to replace advice given to you by your health care provider. Make sure you discuss any questions you have with your health care provider. ° °

## 2014-12-12 NOTE — Progress Notes (Signed)
Patient here for follow from hospital Patient was admitted for CHF Patient is supposed to be on continuous oxygen 2liters but did not bring his portable  Tank  Patient presents with o2 saturation of 68% Put patient on 2L o2 in office

## 2014-12-13 ENCOUNTER — Telehealth: Payer: Self-pay

## 2014-12-13 NOTE — Telephone Encounter (Signed)
-----   Message from Adam Marek, MD sent at 12/13/2014 11:31 AM EDT ----- Call and let the patient know that his creatinine level is slightly trended up, advise patient to avoid any over the counter NSAIDs like ibuprofen or Aleve, will check blood chemistry on the following visit.

## 2014-12-13 NOTE — Telephone Encounter (Signed)
Patient not available Left message with family member to have him return our call 

## 2014-12-21 ENCOUNTER — Telehealth: Payer: Self-pay | Admitting: Internal Medicine

## 2014-12-21 ENCOUNTER — Telehealth: Payer: Self-pay

## 2014-12-21 MED ORDER — ATORVASTATIN CALCIUM 40 MG PO TABS: 40.0000 mg | ORAL_TABLET | Freq: Every day | ORAL | Status: DC

## 2014-12-21 NOTE — Telephone Encounter (Signed)
Patient is aware his prescription has been refilled and he Can pick it up at [facility] pharmacy

## 2014-12-21 NOTE — Telephone Encounter (Signed)
Patient called requesting medication refill on atorvastatin (LIPITOR) 40 MG tablet. Patient states he is not sure if mg where changed,please f/u with patient

## 2014-12-23 NOTE — Telephone Encounter (Signed)
[facility] called to check on the status of a Certification and Plan of Care form that was faxed to our facility on 12/20/2014. Please f/u.

## 2014-12-26 ENCOUNTER — Telehealth: Payer: Self-pay | Admitting: Internal Medicine

## 2014-12-26 ENCOUNTER — Telehealth: Payer: Self-pay

## 2014-12-26 NOTE — Telephone Encounter (Signed)
Patient called requesting letter for work, patient states employer is requesting letter. Patient states he carries oxygen tank . Please f/u

## 2014-12-26 NOTE — Telephone Encounter (Signed)
Returned patient phone call Patient not available Left message on voice mail to return our call 

## 2014-12-26 NOTE — Telephone Encounter (Signed)
Certification has already been done, please fax those documents

## 2014-12-26 NOTE — Telephone Encounter (Signed)
Patient called returning nurse's call, please f/u at [phone]

## 2014-12-26 NOTE — Telephone Encounter (Signed)
[facility]

## 2014-12-27 ENCOUNTER — Telehealth: Payer: Self-pay

## 2014-12-27 NOTE — Telephone Encounter (Signed)
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

## 2014-12-29 ENCOUNTER — Other Ambulatory Visit: Payer: Self-pay

## 2014-12-29 ENCOUNTER — Telehealth: Payer: Self-pay

## 2014-12-29 MED ORDER — FUROSEMIDE 40 MG PO TABS: 40.0000 mg | ORAL_TABLET | Freq: Two times a day (BID) | ORAL | Status: DC

## 2014-12-29 NOTE — Telephone Encounter (Signed)
Returned patient call to number left on voice mail [phone] Patient not available Left message on voice mail to return our call

## 2014-12-29 NOTE — Telephone Encounter (Signed)
Patient called returning nurse call, patient would like to be reached at [phone]. Please f/u

## 2014-12-29 NOTE — Telephone Encounter (Signed)
Patient called to speak to nurse in regards to him being able to go back to work with his oxygen. Please f/u with pt at  [phone].

## 2015-02-03 ENCOUNTER — Other Ambulatory Visit: Payer: Self-pay | Admitting: *Deleted

## 2015-02-03 MED ORDER — LISINOPRIL 5 MG PO TABS: 5.0000 mg | ORAL_TABLET | Freq: Every day | ORAL | Status: DC

## 2015-02-10 ENCOUNTER — Encounter: Payer: Self-pay | Admitting: Family Medicine

## 2015-02-10 ENCOUNTER — Ambulatory Visit: Payer: Medicaid Other | Attending: Family Medicine | Admitting: Family Medicine

## 2015-02-10 VITALS — BP 128/83 | HR 77 | Temp 97.9°F | Resp 18 | Ht 71.0 in | Wt 387.0 lb

## 2015-02-10 DIAGNOSIS — B351 Tinea unguium: Secondary | ICD-10-CM

## 2015-02-10 DIAGNOSIS — E119 Type 2 diabetes mellitus without complications: Secondary | ICD-10-CM | POA: Diagnosis present

## 2015-02-10 DIAGNOSIS — E1121 Type 2 diabetes mellitus with diabetic nephropathy: Secondary | ICD-10-CM

## 2015-02-10 DIAGNOSIS — B353 Tinea pedis: Secondary | ICD-10-CM | POA: Diagnosis not present

## 2015-02-10 DIAGNOSIS — I5042 Chronic combined systolic (congestive) and diastolic (congestive) heart failure: Secondary | ICD-10-CM | POA: Diagnosis not present

## 2015-02-10 DIAGNOSIS — F329 Major depressive disorder, single episode, unspecified: Secondary | ICD-10-CM | POA: Insufficient documentation

## 2015-02-10 DIAGNOSIS — Z23 Encounter for immunization: Secondary | ICD-10-CM | POA: Diagnosis not present

## 2015-02-10 DIAGNOSIS — E669 Obesity, unspecified: Secondary | ICD-10-CM

## 2015-02-10 DIAGNOSIS — E662 Morbid (severe) obesity with alveolar hypoventilation: Secondary | ICD-10-CM

## 2015-02-10 DIAGNOSIS — E1169 Type 2 diabetes mellitus with other specified complication: Secondary | ICD-10-CM

## 2015-02-10 DIAGNOSIS — I5033 Acute on chronic diastolic (congestive) heart failure: Secondary | ICD-10-CM | POA: Insufficient documentation

## 2015-02-10 DIAGNOSIS — E559 Vitamin D deficiency, unspecified: Secondary | ICD-10-CM

## 2015-02-10 DIAGNOSIS — Z6841 Body Mass Index (BMI) 40.0 and over, adult: Secondary | ICD-10-CM | POA: Insufficient documentation

## 2015-02-10 DIAGNOSIS — F32A Depression, unspecified: Secondary | ICD-10-CM

## 2015-02-10 LAB — POCT GLYCOSYLATED HEMOGLOBIN (HGB A1C): HEMOGLOBIN A1C: 6.5

## 2015-02-10 LAB — GLUCOSE, POCT (MANUAL RESULT ENTRY): POC GLUCOSE: 88 mg/dL (ref 70–99)

## 2015-02-10 MED ORDER — CARVEDILOL 3.125 MG PO TABS
3.1250 mg | ORAL_TABLET | Freq: Two times a day (BID) | ORAL | Status: DC
Start: 2015-02-10 — End: 2016-04-11

## 2015-02-10 MED ORDER — ATORVASTATIN CALCIUM 40 MG PO TABS: 40.0000 mg | ORAL_TABLET | Freq: Every day | ORAL | Status: DC

## 2015-02-10 MED ORDER — FUROSEMIDE 40 MG PO TABS: ORAL_TABLET | ORAL | Status: DC

## 2015-02-10 MED ORDER — TERBINAFINE HCL 250 MG PO TABS: 250.0000 mg | ORAL_TABLET | Freq: Every day | ORAL | Status: DC

## 2015-02-10 MED ORDER — DULOXETINE HCL 30 MG PO CPEP: 30.0000 mg | ORAL_CAPSULE | Freq: Every day | ORAL | Status: DC

## 2015-02-10 NOTE — Progress Notes (Signed)
C/C swelling on legs and abdominal feels hard  No pain

## 2015-02-10 NOTE — Progress Notes (Signed)
Subjective:  Patient ID: Adam Dennis, male    DOB: [birth year]  Age: 54 y.o. MRN: [id number]  CC: Establish Care  HPI Adam Dennis presents for f/u visit and meet PCP. He has CHF, sleep apnea, morbid obesity, diabetes type 2.   1. DM2: this is a new diagnosis. Patient was unaware. He is not monitoring his sugars are taking medications. He eats a low salt diet. He lives a sedentary lifestyle. He denies polydipsia.  2. CHF: mixed systolic and diastolic CHF. Followed by cardiology. Compliant with lasix, coreg and low salt diet. No exercising. Has low energy. Endorses depressed mood. He has SOB with exertion. He is on 2 L via [place] during the day and CPAP at night.   3. Depressed mood: for 4-5 months. No energy. No motivation. He is not working. He does not like needing some much help. He is very sedentary. He denies SI.   Outpatient Prescriptions Prior to Visit  Medication Sig Dispense Refill  . aspirin EC 81 MG tablet Take 81 mg by mouth daily.    Marland Kitchen atorvastatin (LIPITOR) 40 MG tablet Take 1 tablet (40 mg total) by mouth daily. 30 tablet 2  . calcium carbonate (TUMS - DOSED IN MG ELEMENTAL CALCIUM) 500 MG chewable tablet Chew 1 tablet (200 mg of elemental calcium total) by mouth 2 (two) times daily.    . carvedilol (COREG) 3.125 MG tablet Take 1 tablet (3.125 mg total) by mouth 2 (two) times daily with a meal. 60 tablet 6  . furosemide (LASIX) 40 MG tablet Take 1 tablet (40 mg total) by mouth 2 (two) times daily. 60 tablet 2  . lisinopril (PRINIVIL,ZESTRIL) 5 MG tablet Take 1 tablet (5 mg total) by mouth daily. 30 tablet 0  . nitroGLYCERIN (NITROSTAT) 0.4 MG SL tablet Place 1 tablet (0.4 mg total) under the tongue every 5 (five) minutes x 3 doses as needed for chest pain. 25 tablet 1  . potassium chloride SA (K-DUR,KLOR-CON) 10 MEQ tablet Take 1 tablet (10 mEq total) by mouth daily. 30 tablet 1   No facility-administered medications prior to visit.    ROS Review of Systems    Constitutional: Negative for fever, chills, fatigue and unexpected weight change.  Eyes: Negative for visual disturbance.  Respiratory: Positive for shortness of breath. Negative for cough.   Cardiovascular: Positive for leg swelling. Negative for chest pain and palpitations.  Gastrointestinal: Positive for constipation. Negative for nausea, vomiting, abdominal pain, diarrhea and blood in stool.  Endocrine: Negative for polydipsia, polyphagia and polyuria.  Musculoskeletal: Negative for myalgias, back pain, arthralgias, gait problem and neck pain.  Skin: Negative for rash.  Allergic/Immunologic: Negative for immunocompromised state.  Hematological: Negative for adenopathy. Does not bruise/bleed easily.  Psychiatric/Behavioral: Positive for dysphoric mood. Negative for suicidal ideas and sleep disturbance. The patient is not nervous/anxious.   GAD-7: score of 9. 3-2,3,4. 0 to all others.   Objective:  BP 128/83 mmHg  Pulse 77  Temp(Src) 97.9 F (36.6 C) (Oral)  Resp 18  Ht 5\' 11"  (1.803 m)  Wt 387 lb (175.542 kg)  BMI 54.00 kg/m2  SpO2 95%  BP/Weight 02/10/2015 [id number]  Systolic BP 0000000 0000000 [id number]  Diastolic BP 83 83 57  Wt. (Lbs) 387 361.6 352.7  BMI 54 49.74 48.51    Physical Exam  Constitutional: He appears well-developed and well-nourished. No distress.  Morbidly obese On 2 L Box   HENT:  Mouth/Throat: Oropharynx is clear and moist.  Eyes: Conjunctivae are  normal. Pupils are equal, round, and reactive to light.  Neck: Normal range of motion. Neck supple.  Cardiovascular: Normal rate, regular rhythm, normal heart sounds and intact distal pulses.   Pulmonary/Chest: Effort normal and breath sounds normal.  Abdominal: Soft. He exhibits distension. He exhibits no mass. There is no tenderness. There is no rebound and no guarding.    Musculoskeletal: He exhibits edema (1+ pitting edema in lower legs ). He exhibits no tenderness.  Neurological: He is alert.  Skin: Skin  is warm and dry. No rash noted. No erythema.     Psychiatric: He has a normal mood and affect.   Depression screen [facility] 2/9 02/10/2015 12/12/2014  Decreased Interest 2 0  Down, Depressed, Hopeless 1 0  PHQ - 2 Score 3 0  Altered sleeping 3 -  Tired, decreased energy 3 -  Change in appetite 2 -  Feeling bad or failure about yourself  2 -  Trouble concentrating 2 -  Moving slowly or fidgety/restless 3 -  Suicidal thoughts 0 -  PHQ-9 Score 18 -     Assessment & Plan:    Adam Dennis was seen today for establish care.  Diagnoses and all orders for this visit:  Diabetes mellitus type 2 in obese -     HgB A1c -     Glucose (CBG) -     Microalbumin/Creatinine Ratio, Urine -     atorvastatin (LIPITOR) 40 MG tablet; Take 1 tablet (40 mg total) by mouth daily.  Chronic combined systolic and diastolic CHF (congestive heart failure) -     furosemide (LASIX) 40 MG tablet; 80 mg by mouth in the morning, 40 mg in afternoon -     carvedilol (COREG) 3.125 MG tablet; Take 1 tablet (3.125 mg total) by mouth 2 (two) times daily with a meal.  Vitamin D deficiency -     Vitamin D, 25-hydroxy  Depression -     DULoxetine (CYMBALTA) 30 MG capsule; Take 1 capsule (30 mg total) by mouth daily.  Tinea pedis of both feet -     terbinafine (LAMISIL) 250 MG tablet; Take 1 tablet (250 mg total) by mouth daily. For 12 weeks  Onychomycosis -     terbinafine (LAMISIL) 250 MG tablet; Take 1 tablet (250 mg total) by mouth daily. For 12 weeks  Encounter for immunization  Obesity hypoventilation syndrome  Other orders -     Flu Vaccine QUAD 36+ mos IM   Meds ordered this encounter  Medications  . DULoxetine (CYMBALTA) 30 MG capsule    Sig: Take 1 capsule (30 mg total) by mouth daily.    Dispense:  30 capsule    Refill:  2  . terbinafine (LAMISIL) 250 MG tablet    Sig: Take 1 tablet (250 mg total) by mouth daily. For 12 weeks    Dispense:  30 tablet    Refill:  2  . furosemide (LASIX) 40 MG  tablet    Sig: 80 mg by mouth in the morning, 40 mg in afternoon    Dispense:  90 tablet    Refill:  2    Follow-up: No Follow-up on file.   Boykin Nearing MD

## 2015-02-10 NOTE — Assessment & Plan Note (Signed)
A; OSA/OHS P: Increase O2 to 3 L via [place]

## 2015-02-10 NOTE — Assessment & Plan Note (Signed)
A; chronic CHF with morbid obesity. Patient has gained weight. Patient is sedentary P: Increase lasix dose to 80 mg in AM, 40 mg in PM Treat depression

## 2015-02-10 NOTE — Patient Instructions (Addendum)
Mr. Trebing,   Thank you for coming in today. It was a pleasure meeting you. I look forward to being your primary doctor.   1. Depressed mood with low energy: Start cymbalta 30 mg daily Referral to card  Radek was seen today for establish care.  Diagnoses and all orders for this visit:  Diabetes mellitus type 2 in obese- well controlled. Low carb diet and increase exercise.  -     HgB A1c -     Glucose (CBG) -     Microalbumin/Creatinine Ratio, Urine  Chronic combined systolic and diastolic CHF (congestive heart failure) -     furosemide (LASIX) 40 MG tablet; 80 mg by mouth in the morning, 40 mg in afternoon  -increase supplemental O2 to 3 L via [place], I [facility]  Vitamin D deficiency -     Vitamin D, 25-hydroxy  Depression -     DULoxetine (CYMBALTA) 30 MG capsule; Take 1 capsule (30 mg total) by mouth daily.  Tinea pedis of both feet -     terbinafine (LAMISIL) 250 MG tablet; Take 1 tablet (250 mg total) by mouth daily. For 12 weeks  Onychomycosis -     terbinafine (LAMISIL) 250 MG tablet; Take 1 tablet (250 mg total) by mouth daily. For 12 weeks  F/u in 4 week with me for depressed mood  Dr. Adrian Blackwater

## 2015-02-11 LAB — MICROALBUMIN / CREATININE URINE RATIO
Creatinine, Urine: 44.4 mg/dL
Microalb Creat Ratio: 2520.3 mg/g — ABNORMAL HIGH (ref 0.0–30.0)
Microalb, Ur: 111.9 mg/dL — ABNORMAL HIGH (ref <2.0)

## 2015-02-11 LAB — VITAMIN D 25 HYDROXY (VIT D DEFICIENCY, FRACTURES): Vit D, 25-Hydroxy: 16 ng/mL — ABNORMAL LOW (ref 30–100)

## 2015-02-13 DIAGNOSIS — E1121 Type 2 diabetes mellitus with diabetic nephropathy: Secondary | ICD-10-CM | POA: Insufficient documentation

## 2015-02-13 MED ORDER — VITAMIN D (ERGOCALCIFEROL) 1.25 MG (50000 UNIT) PO CAPS: 50000.0000 [IU] | ORAL_CAPSULE | ORAL | Status: DC

## 2015-02-13 MED ORDER — LISINOPRIL 20 MG PO TABS: 20.0000 mg | ORAL_TABLET | Freq: Every day | ORAL | Status: DC

## 2015-02-13 NOTE — Addendum Note (Signed)
Addended by: Boykin Nearing on: 02/13/2015 01:58 PM   Modules accepted: Orders

## 2015-02-14 ENCOUNTER — Telehealth: Payer: Self-pay | Admitting: *Deleted

## 2015-02-14 NOTE — Telephone Encounter (Signed)
LVM to return call.

## 2015-02-14 NOTE — Telephone Encounter (Signed)
-----   Message from Boykin Nearing, MD sent at 02/13/2015  1:56 PM EDT ----- Vit D insufficiency Take vit D 50K U weekly for 12 weeks Elevated urine microalbumin, patient should increase lisinopril from 5 to 20 mg daily with goal increase dose up to 40 mg if tolerated to help protect kidneys and prevent renal disease

## 2015-04-03 ENCOUNTER — Ambulatory Visit: Payer: Medicaid Other | Attending: Family Medicine

## 2015-04-05 ENCOUNTER — Encounter: Payer: Self-pay | Admitting: Family Medicine

## 2015-04-05 ENCOUNTER — Ambulatory Visit: Payer: Medicaid Other | Attending: Family Medicine | Admitting: Family Medicine

## 2015-04-05 VITALS — BP 115/77 | HR 68 | Temp 97.7°F | Resp 16 | Ht 71.0 in | Wt 335.0 lb

## 2015-04-05 DIAGNOSIS — Z9981 Dependence on supplemental oxygen: Secondary | ICD-10-CM

## 2015-04-05 DIAGNOSIS — F329 Major depressive disorder, single episode, unspecified: Secondary | ICD-10-CM

## 2015-04-05 DIAGNOSIS — F32A Depression, unspecified: Secondary | ICD-10-CM

## 2015-04-05 NOTE — Assessment & Plan Note (Signed)
Your O2 dropped to 77 % with walking. I cannot clear you to return to work without oxygen. 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

## 2015-04-05 NOTE — Progress Notes (Signed)
F/U Depression  Pt stated feeling good now  No pain today

## 2015-04-05 NOTE — Patient Instructions (Addendum)
Mr. Brusky,  Thank you for coming in today  Depression improved, you can continue to not take cymbalta. This has been removed from med list.   Your O2 dropped to 77 % with walking. I cannot clear you to return to work without oxygen. 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  You can go without oxygen when at rest and awake.  At rest and asleep use CPAP.  Dr. Adrian Blackwater    F/U 3 month

## 2015-04-05 NOTE — Progress Notes (Signed)
Patient ID: Adam Dennis, male   DOB: 1961-01-28, 54 y.o.   MRN: [id number]    Subjective:  Patient ID: Adam Dennis, male    DOB: 07/10/1960  Age: 54 y.o. MRN: [id number]  CC: Depression   HPI Adam Dennis presents for   1. F/u depression: took cymbalta for one month. Out of cymbalta for about 2 weeks. Feeling better. Would like to stop cymbalta. Tolerated cymablta. Denies chronic pain.   2. ? Return to work: patient would like to return to work. He works in a [employer]. He uses O2 3 L and CPAP at night. He has combined CHF and obesity hypoventilation syndrome. He plans to have his O2 available outside. He needs to return to work. He has applied disability and been denied.    Social History  Substance Use Topics  . Smoking status: Never Smoker   . Smokeless tobacco: Never Used  . Alcohol Use: No    Outpatient Prescriptions Prior to Visit  Medication Sig Dispense Refill  . aspirin EC 81 MG tablet Take 81 mg by mouth daily.    Marland Kitchen atorvastatin (LIPITOR) 40 MG tablet Take 1 tablet (40 mg total) by mouth daily. 30 tablet 5  . calcium carbonate (TUMS - DOSED IN MG ELEMENTAL CALCIUM) 500 MG chewable tablet Chew 1 tablet (200 mg of elemental calcium total) by mouth 2 (two) times daily.    . carvedilol (COREG) 3.125 MG tablet Take 1 tablet (3.125 mg total) by mouth 2 (two) times daily with a meal. 60 tablet 5  . DULoxetine (CYMBALTA) 30 MG capsule Take 1 capsule (30 mg total) by mouth daily. 30 capsule 2  . furosemide (LASIX) 40 MG tablet 80 mg by mouth in the morning, 40 mg in afternoon 90 tablet 2  . lisinopril (PRINIVIL,ZESTRIL) 20 MG tablet Take 1 tablet (20 mg total) by mouth daily. 30 tablet 2  . nitroGLYCERIN (NITROSTAT) 0.4 MG SL tablet Place 1 tablet (0.4 mg total) under the tongue every 5 (five) minutes x 3 doses as needed for chest pain. 25 tablet 1  . potassium chloride SA (K-DUR,KLOR-CON) 10 MEQ tablet Take 1 tablet (10 mEq total) by mouth daily. 30 tablet 1  . terbinafine  (LAMISIL) 250 MG tablet Take 1 tablet (250 mg total) by mouth daily. For 12 weeks 30 tablet 2  . Vitamin D, Ergocalciferol, (DRISDOL) 50000 UNITS CAPS capsule Take 1 capsule (50,000 Units total) by mouth every 7 (seven) days. For 8 weeks 8 capsule 0   No facility-administered medications prior to visit.    ROS Review of Systems  Constitutional: Negative for fever, chills, fatigue and unexpected weight change.  Eyes: Negative for visual disturbance.  Respiratory: Positive for shortness of breath. Negative for cough.   Cardiovascular: Negative for chest pain, palpitations and leg swelling.  Gastrointestinal: Negative for nausea, vomiting, abdominal pain, diarrhea, constipation and blood in stool.  Endocrine: Negative for polydipsia, polyphagia and polyuria.  Musculoskeletal: Negative for myalgias, back pain, arthralgias, gait problem and neck pain.  Skin: Negative for rash.  Allergic/Immunologic: Negative for immunocompromised state.  Hematological: Negative for adenopathy. Does not bruise/bleed easily.  Psychiatric/Behavioral: Negative for suicidal ideas, sleep disturbance and dysphoric mood. The patient is not nervous/anxious.     Objective:  BP 115/77 mmHg  Pulse 68  Temp(Src) 97.7 F (36.5 C) (Oral)  Resp 16  Ht 5\' 11"  (1.803 m)  Wt 335 lb (151.955 kg)  BMI 46.74 kg/m2  SpO2 99%  BP/Weight 04/05/2015 02/10/2015 12/12/2014  Systolic BP [id number] 0000000  Diastolic BP 77 83 83  Wt. (Lbs) 335 387 361.6  BMI 46.74 54 49.74   Ambulatory O2 88 % at rest on RA, 77% with exertion room air, 99 % with exertion on 3 L [place]   Physical Exam  Constitutional: He appears well-developed and well-nourished. No distress.  Morbidly obese [place] in place   HENT:  Head: Normocephalic and atraumatic.  Neck: Normal range of motion. Neck supple.  Pulmonary/Chest: Effort normal.  Musculoskeletal: He exhibits no edema.  Neurological: He is alert.  Skin: Skin is warm and dry. No rash noted. No erythema.    Psychiatric: He has a normal mood and affect.   Depression screen [facility] - [facility] 2/9 04/05/2015 02/10/2015 12/12/2014  Decreased Interest 0 2 0  Down, Depressed, Hopeless 0 1 0  PHQ - 2 Score 0 3 0  Altered sleeping 0 3 -  Tired, decreased energy 0 3 -  Change in appetite 0 2 -  Feeling bad or failure about yourself  1 2 -  Trouble concentrating 0 2 -  Moving slowly or fidgety/restless 0 3 -  Suicidal thoughts 0 0 -  PHQ-9 Score 1 18 -    GAD 7 : Generalized Anxiety Score 04/05/2015  Nervous, Anxious, on Edge 0  Control/stop worrying 0  Worry too much - different things 0  Trouble relaxing 0  Restless 0  Easily annoyed or irritable 2  Afraid - awful might happen 0  Total GAD 7 Score 2       Assessment & Plan:   Problem List Items Addressed This Visit    Depression - Primary      No orders of the defined types were placed in this encounter.    Follow-up: No Follow-up on file.   Boykin Nearing MD

## 2015-06-01 MED FILL — ?ATORVASTATIN 40MG TABLET: 40 | 30 days supply | Qty: 30 | Fill #3

## 2015-06-01 MED FILL — TERBINAFINE HCL 250 MG TAB: 250 | 30 days supply | Qty: 30 | Fill #1

## 2015-06-06 ENCOUNTER — Other Ambulatory Visit: Payer: Self-pay | Admitting: Family Medicine

## 2015-06-06 MED FILL — POTASSIUM CL 10 MEQ TAB SA: 10 | 30 days supply | Qty: 30 | Fill #4

## 2015-06-06 MED FILL — ?LISINOPRIL 20 MG TABLET: 20 | 30 days supply | Qty: 30 | Fill #0

## 2015-06-12 MED FILL — ?CARVEDILOL 3.125 MG TABLET: 3.125 | 30 days supply | Qty: 60 | Fill #3

## 2015-06-16 ENCOUNTER — Other Ambulatory Visit: Payer: Self-pay | Admitting: Family Medicine

## 2015-06-16 MED FILL — ?FUROSEMIDE 40 MG TABLET: 40 | 30 days supply | Qty: 90 | Fill #0

## 2015-06-26 ENCOUNTER — Ambulatory Visit: Payer: Medicaid Other | Attending: Family Medicine | Admitting: Family Medicine

## 2015-06-26 ENCOUNTER — Encounter: Payer: Self-pay | Admitting: Family Medicine

## 2015-06-26 VITALS — BP 138/85 | HR 77 | Temp 98.4°F | Resp 16 | Ht 71.0 in | Wt 339.0 lb

## 2015-06-26 DIAGNOSIS — E669 Obesity, unspecified: Secondary | ICD-10-CM | POA: Diagnosis not present

## 2015-06-26 DIAGNOSIS — I10 Essential (primary) hypertension: Secondary | ICD-10-CM | POA: Insufficient documentation

## 2015-06-26 DIAGNOSIS — I5042 Chronic combined systolic (congestive) and diastolic (congestive) heart failure: Secondary | ICD-10-CM | POA: Insufficient documentation

## 2015-06-26 DIAGNOSIS — E118 Type 2 diabetes mellitus with unspecified complications: Secondary | ICD-10-CM | POA: Insufficient documentation

## 2015-06-26 DIAGNOSIS — Z0000 Encounter for general adult medical examination without abnormal findings: Secondary | ICD-10-CM | POA: Diagnosis not present

## 2015-06-26 DIAGNOSIS — Z23 Encounter for immunization: Secondary | ICD-10-CM

## 2015-06-26 DIAGNOSIS — Z9981 Dependence on supplemental oxygen: Secondary | ICD-10-CM | POA: Insufficient documentation

## 2015-06-26 DIAGNOSIS — E119 Type 2 diabetes mellitus without complications: Secondary | ICD-10-CM

## 2015-06-26 DIAGNOSIS — Z7982 Long term (current) use of aspirin: Secondary | ICD-10-CM | POA: Insufficient documentation

## 2015-06-26 DIAGNOSIS — E1169 Type 2 diabetes mellitus with other specified complication: Secondary | ICD-10-CM

## 2015-06-26 DIAGNOSIS — N529 Male erectile dysfunction, unspecified: Secondary | ICD-10-CM | POA: Insufficient documentation

## 2015-06-26 DIAGNOSIS — Z79899 Other long term (current) drug therapy: Secondary | ICD-10-CM | POA: Diagnosis not present

## 2015-06-26 DIAGNOSIS — Z6841 Body Mass Index (BMI) 40.0 and over, adult: Secondary | ICD-10-CM | POA: Insufficient documentation

## 2015-06-26 DIAGNOSIS — E559 Vitamin D deficiency, unspecified: Secondary | ICD-10-CM

## 2015-06-26 LAB — BASIC METABOLIC PANEL
BUN: 17 mg/dL (ref 7–25)
CHLORIDE: 95 mmol/L — AB (ref 98–110)
CO2: 38 mmol/L — AB (ref 20–31)
Calcium: 8.4 mg/dL — ABNORMAL LOW (ref 8.6–10.3)
Creat: 2.29 mg/dL — ABNORMAL HIGH (ref 0.70–1.33)
GLUCOSE: 86 mg/dL (ref 65–99)
Potassium: 4.2 mmol/L (ref 3.5–5.3)
Sodium: 141 mmol/L (ref 135–146)

## 2015-06-26 LAB — GLUCOSE, POCT (MANUAL RESULT ENTRY): POC Glucose: 65 mg/dl — AB (ref 70–99)

## 2015-06-26 LAB — POCT GLYCOSYLATED HEMOGLOBIN (HGB A1C): HEMOGLOBIN A1C: 6

## 2015-06-26 MED ORDER — LISINOPRIL 20 MG PO TABS: 20.0000 mg | ORAL_TABLET | Freq: Every day | ORAL | Status: DC

## 2015-06-26 MED ORDER — FUROSEMIDE 40 MG PO TABS: ORAL_TABLET | ORAL | Status: DC

## 2015-06-26 MED FILL — LISINOPRIL 20 MG TABLET: 20 | 30 days supply | Qty: 30 | Fill #0

## 2015-06-26 MED FILL — ATORVASTATIN 40 MG TABLET: 40 | 30 days supply | Qty: 30 | Fill #4

## 2015-06-26 MED FILL — FUROSEMIDE 40 MG TABLET: 40 | 30 days supply | Qty: 90 | Fill #0

## 2015-06-26 NOTE — Progress Notes (Signed)
F/U HTN Taking medication as prescribed  No pain today  No tobacco user  No suicidal thought in the past two weeks

## 2015-06-26 NOTE — Patient Instructions (Signed)
[place] was seen today for diabetes and hypertension.  Diagnoses and all orders for this visit:  Essential hypertension, benign -     Basic Metabolic Panel -     furosemide (LASIX) 40 MG tablet; TAKE 2 TABLETS BY MOUTH IN THE MORNING AND 1 TABLET IN THE AFTERNOON.  Diabetes mellitus type 2 in obese (HCC) -     HgB A1c -     Glucose (CBG) -     furosemide (LASIX) 40 MG tablet; TAKE 2 TABLETS BY MOUTH IN THE MORNING AND 1 TABLET IN THE AFTERNOON. -     Ambulatory referral to Ophthalmology  Chronic combined systolic and diastolic CHF (congestive heart failure) (HCC) -     furosemide (LASIX) 40 MG tablet; TAKE 2 TABLETS BY MOUTH IN THE MORNING AND 1 TABLET IN THE AFTERNOON. -     lisinopril (PRINIVIL,ZESTRIL) 20 MG tablet; Take 1 tablet (20 mg total) by mouth daily.  Healthcare maintenance -     Tdap vaccine greater than or equal to 7yo IM -     Ambulatory referral to Gastroenterology  Vitamin D deficiency -     Vitamin D, 25-hydroxy  Erectile dysfunction, unspecified erectile dysfunction type  Please discuss your heart health with Dr. Doylene Canard and the possibility of adding viagra for erectile dysfunction Remember that viagra, cialis, levitra cannot be used with nitroglycerin due to high risk of unsafe drop in blood pressure  Please ask Dr. Doylene Canard to fax his recommendations to me regarding adding medication for erectile dysfunction. If meds are not recommended I will refer you to urology.   F/u with me in 3 months for HTN, diabetes and CHF  Dr. Adrian Blackwater

## 2015-06-26 NOTE — Assessment & Plan Note (Signed)
Remains diet controlled Opthalmology referral placed

## 2015-06-26 NOTE — Assessment & Plan Note (Signed)
A; ED in setting of HTN, O2 dependent, systolic CHF P: Consider viagra Patient is not requiring nitroglycerin Patient to f/u with his cardiologist and get recommendations on viagra

## 2015-06-26 NOTE — Assessment & Plan Note (Signed)
A: BP well controlled P:  Continue current regimen BMP today

## 2015-06-26 NOTE — Progress Notes (Signed)
Subjective:  Patient ID: Adam Dennis, male    DOB: [birth year]  Age: 55 y.o. MRN: [id number]  CC: Diabetes and Hypertension   HPI Adam Dennis presents for f/u. He has hx of obestiy hypoventilation syndrome, O2 dependent, chronic diastolic and systolic CHF   1. CHRONIC HYPERTENSION  Disease Monitoring  Blood pressure range: not checking   Chest pain: no   Dyspnea: no, not with 2 L O2    Claudication: no   Medication compliance: yes  Medication Side Effects  Lightheadedness: no   Urinary frequency: no   Edema: no   Impotence: yes   Preventitive Healthcare:  Exercise: yes, walking most day    Diet Pattern: regular meals   Salt Restriction: yes   2. CHRONIC DIABETES  Disease Monitoring  Blood Sugar Ranges: not checking   Polyuria: yes, on lasix    Visual problems: no   Medication Compliance: yes  Medication Side Effects  Hypoglycemia: no   Preventitive Health Care  Eye Exam: due   Foot Exam: done recently   Diet pattern: low carb and low salt  Exercise: walks daily   3. ED: x one month. Patient with one longterm partner. Partner has complained. Patient with difficulty obtaining strong erection and keeping erection.   Social History  Substance Use Topics  . Smoking status: Never Smoker   . Smokeless tobacco: Never Used  . Alcohol Use: No    Outpatient Prescriptions Prior to Visit  Medication Sig Dispense Refill  . aspirin EC 81 MG tablet Take 81 mg by mouth daily.    Marland Kitchen atorvastatin (LIPITOR) 40 MG tablet Take 1 tablet (40 mg total) by mouth daily. 30 tablet 5  . calcium carbonate (TUMS - DOSED IN MG ELEMENTAL CALCIUM) 500 MG chewable tablet Chew 1 tablet (200 mg of elemental calcium total) by mouth 2 (two) times daily.    . carvedilol (COREG) 3.125 MG tablet Take 1 tablet (3.125 mg total) by mouth 2 (two) times daily with a meal. 60 tablet 5  . furosemide (LASIX) 40 MG tablet TAKE 2 TABLETS BY MOUTH IN THE MORNING AND 1 TABLET IN THE AFTERNOON. 90  tablet 2  . lisinopril (PRINIVIL,ZESTRIL) 20 MG tablet TAKE 1 TABLET BY MOUTH DAILY. 30 tablet 2  . nitroGLYCERIN (NITROSTAT) 0.4 MG SL tablet Place 1 tablet (0.4 mg total) under the tongue every 5 (five) minutes x 3 doses as needed for chest pain. 25 tablet 1  . potassium chloride SA (K-DUR,KLOR-CON) 10 MEQ tablet Take 1 tablet (10 mEq total) by mouth daily. 30 tablet 1  . Vitamin D, Ergocalciferol, (DRISDOL) 50000 UNITS CAPS capsule Take 1 capsule (50,000 Units total) by mouth every 7 (seven) days. For 8 weeks 8 capsule 0  . terbinafine (LAMISIL) 250 MG tablet Take 1 tablet (250 mg total) by mouth daily. For 12 weeks (Patient not taking: Reported on 06/26/2015) 30 tablet 2   No facility-administered medications prior to visit.    ROS Review of Systems  Constitutional: Negative for fever, chills, fatigue and unexpected weight change.  Eyes: Negative for visual disturbance.  Respiratory: Negative for cough and shortness of breath.   Cardiovascular: Negative for chest pain, palpitations and leg swelling.  Gastrointestinal: Negative for nausea, vomiting, abdominal pain, diarrhea, constipation and blood in stool.  Endocrine: Negative for polydipsia, polyphagia and polyuria.  Genitourinary:       Erectile dysfunction   Musculoskeletal: Negative for myalgias, back pain, arthralgias, gait problem and neck pain.  Skin: Negative for  rash.  Allergic/Immunologic: Negative for immunocompromised state.  Hematological: Negative for adenopathy. Does not bruise/bleed easily.  Psychiatric/Behavioral: Negative for suicidal ideas, sleep disturbance and dysphoric mood. The patient is not nervous/anxious.    Objective:  BP 138/85 mmHg  Pulse 77  Temp(Src) 98.4 F (36.9 C) (Oral)  Resp 16  Ht 5\' 11"  (1.803 m)  Wt 339 lb (153.769 kg)  BMI 47.30 kg/m2  SpO2 98%  BP/Weight 06/26/2015 04/05/2015 [id number]  Systolic BP 0000000 [id number]  Diastolic BP 85 77 83  Wt. (Lbs) 339 335 387  BMI 47.3 46.74 54     Physical Exam  Constitutional: He appears well-developed and well-nourished. No distress.  HENT:  Head: Normocephalic and atraumatic.  Neck: Normal range of motion. Neck supple.  Cardiovascular: Normal rate, regular rhythm, normal heart sounds and intact distal pulses.   Pulmonary/Chest: Effort normal and breath sounds normal.  Musculoskeletal: He exhibits no edema.  Neurological: He is alert.  Skin: Skin is warm and dry. No rash noted. No erythema.  Psychiatric: He has a normal mood and affect.   Lab Results  Component Value Date   HGBA1C 6.0 06/26/2015    Assessment & Plan:   Meds ordered this encounter  Medications  . furosemide (LASIX) 40 MG tablet    Sig: TAKE 2 TABLETS BY MOUTH IN THE MORNING AND 1 TABLET IN THE AFTERNOON.    Dispense:  90 tablet    Refill:  2  . lisinopril (PRINIVIL,ZESTRIL) 20 MG tablet    Sig: Take 1 tablet (20 mg total) by mouth daily.    Dispense:  30 tablet    Refill:  2    Follow-up: No Follow-up on file.   Boykin Nearing MD

## 2015-06-27 ENCOUNTER — Encounter: Payer: Self-pay | Admitting: Family Medicine

## 2015-06-27 LAB — VITAMIN D 25 HYDROXY (VIT D DEFICIENCY, FRACTURES): VIT D 25 HYDROXY: 5 ng/mL — AB (ref 30–100)

## 2015-06-27 MED ORDER — CALCIUM CITRATE 200 MG PO TABS: 400.0000 mg | ORAL_TABLET | Freq: Every day | ORAL | Status: DC

## 2015-06-27 MED ORDER — VITAMIN D (ERGOCALCIFEROL) 1.25 MG (50000 UNIT) PO CAPS: 50000.0000 [IU] | ORAL_CAPSULE | ORAL | Status: DC

## 2015-06-27 MED ORDER — FUROSEMIDE 40 MG PO TABS: 40.0000 mg | ORAL_TABLET | Freq: Two times a day (BID) | ORAL | Status: DC

## 2015-06-27 NOTE — Addendum Note (Signed)
Addended by: Boykin Nearing on: 06/27/2015 09:16 AM   Modules accepted: Orders, SmartSet

## 2015-07-03 ENCOUNTER — Telehealth: Payer: Self-pay | Admitting: *Deleted

## 2015-07-03 NOTE — Telephone Encounter (Signed)
-----   Message from Boykin Nearing, MD sent at 06/27/2015  9:13 AM EST ----- Vit D deficiency very low, take 50000 U weekly for 12 weeks then recheck with plan for 50000 IU monthtly Also take calcium   Cr elevated and chloride a bit low on BMP, decrease lasix to 40 mg BID, 1 tablet twice daily

## 2015-07-03 NOTE — Telephone Encounter (Signed)
LVM to return call.

## 2015-07-03 NOTE — Telephone Encounter (Signed)
Pt. Returned call. Please f/u with pt. °

## 2015-07-04 MED FILL — POTASSIUM CL 10 MEQ TAB SA: 10 | 30 days supply | Qty: 30 | Fill #5

## 2015-07-04 MED FILL — VIT D2 1.25 MG (50,000 UNIT: 1.25 MG | 28 days supply | Qty: 4 | Fill #0

## 2015-07-04 NOTE — Telephone Encounter (Signed)
Date of birth verified by pt Lab results given  Notified Rx Vit D at [facility]  Take one pill once per week x 12 week recheck Vit D level Decrease lasix to 40 mg bid Pt verbalized understanding

## 2015-07-05 ENCOUNTER — Telehealth: Payer: Self-pay | Admitting: Family Medicine

## 2015-07-05 ENCOUNTER — Telehealth: Payer: Self-pay

## 2015-07-05 NOTE — Telephone Encounter (Signed)
CMA called patient, patient verified name and DOB. Patient had a question about medication change. I reiterate per Dr. Adrian Blackwater, that he's to take 40mg  lasix BID, instead of 40mg   (3) in the am and (1) in the pm. Patient verbalized that he understood with no further questions.

## 2015-07-05 NOTE — Telephone Encounter (Signed)
Patient needs further explanation on his lasix....patient was informed to cut back to 40 mg. He was taking 3 in the am 1 in the pm, and would like to know what to take now, should he just take one pill?Marland Kitchen...please follow up

## 2015-07-10 NOTE — Telephone Encounter (Signed)
Pt was instructed to take 40mg  twice daily  Verbalized understanding

## 2015-07-10 NOTE — Telephone Encounter (Signed)
Patient instructed to take 40 mg twice daily

## 2015-07-18 ENCOUNTER — Telehealth: Payer: Self-pay | Admitting: Family Medicine

## 2015-07-18 NOTE — Telephone Encounter (Signed)
Pt. Called stating that his nose has been bleeding. Pt. Would like to speak to the nurse please f/u

## 2015-07-19 MED FILL — TRAVATAN Z 0.004% EYE DROP: 0.004 | 30 days supply | Qty: 5 | Fill #0

## 2015-07-19 NOTE — Telephone Encounter (Signed)
Pt stated nose bleed happen in the morning and think is due to dryness due to the c-pap machine Stated if continue will schedule appointment with PCP

## 2015-07-26 MED FILL — VIT D2 1.25 MG (50,000 UNIT: 1.25 MG | 28 days supply | Qty: 4 | Fill #1

## 2015-07-26 MED FILL — POTASSIUM CL 10 MEQ TAB SA: 10 | 30 days supply | Qty: 30 | Fill #6

## 2015-07-26 MED FILL — ATORVASTATIN 40 MG TABLET: 40 | 30 days supply | Qty: 30 | Fill #5

## 2015-07-26 MED FILL — FUROSEMIDE 40 MG TABLET: 40 | 30 days supply | Qty: 90 | Fill #1

## 2015-07-26 MED FILL — CARVEDILOL 3.125 MG TABLET: 3.125 | 30 days supply | Qty: 60 | Fill #4

## 2015-07-26 MED FILL — LISINOPRIL 20 MG TABLET: 20 | 30 days supply | Qty: 30 | Fill #1

## 2015-08-17 MED FILL — TRAVATAN Z 0.004% EYE DROP: 0.004 | 30 days supply | Qty: 5 | Fill #1

## 2015-08-30 ENCOUNTER — Other Ambulatory Visit: Payer: Self-pay | Admitting: Family Medicine

## 2015-08-30 MED FILL — ATORVASTATIN 40 MG TABLET: 40 | 30 days supply | Qty: 30 | Fill #2

## 2015-08-30 MED FILL — CARVEDILOL 3.125 MG TABLET: 3.125 | 30 days supply | Qty: 60 | Fill #5

## 2015-08-30 MED FILL — VIT D2 1.25 MG (50,000 UNIT: 1.25 MG | 28 days supply | Qty: 4 | Fill #2

## 2015-08-30 MED FILL — LISINOPRIL 20 MG TABLET: 20 | 30 days supply | Qty: 30 | Fill #2

## 2015-09-03 ENCOUNTER — Emergency Department (HOSPITAL_COMMUNITY): Payer: Medicaid Other

## 2015-09-03 ENCOUNTER — Emergency Department (HOSPITAL_COMMUNITY)
Admission: EM | Admit: 2015-09-03 | Discharge: 2015-09-03 | Disposition: A | Discharge status: Home / Self Care | Payer: Medicaid Other | Attending: Emergency Medicine | Admitting: Emergency Medicine

## 2015-09-03 ENCOUNTER — Encounter (HOSPITAL_COMMUNITY): Payer: Self-pay | Admitting: Emergency Medicine

## 2015-09-03 DIAGNOSIS — I5042 Chronic combined systolic (congestive) and diastolic (congestive) heart failure: Secondary | ICD-10-CM | POA: Diagnosis not present

## 2015-09-03 DIAGNOSIS — E669 Obesity, unspecified: Secondary | ICD-10-CM | POA: Diagnosis not present

## 2015-09-03 DIAGNOSIS — Z8669 Personal history of other diseases of the nervous system and sense organs: Secondary | ICD-10-CM | POA: Insufficient documentation

## 2015-09-03 DIAGNOSIS — M549 Dorsalgia, unspecified: Secondary | ICD-10-CM | POA: Insufficient documentation

## 2015-09-03 DIAGNOSIS — Z8709 Personal history of other diseases of the respiratory system: Secondary | ICD-10-CM | POA: Insufficient documentation

## 2015-09-03 DIAGNOSIS — N390 Urinary tract infection, site not specified: Secondary | ICD-10-CM | POA: Diagnosis not present

## 2015-09-03 DIAGNOSIS — Z79899 Other long term (current) drug therapy: Secondary | ICD-10-CM | POA: Diagnosis not present

## 2015-09-03 DIAGNOSIS — I10 Essential (primary) hypertension: Secondary | ICD-10-CM | POA: Diagnosis not present

## 2015-09-03 DIAGNOSIS — Z7982 Long term (current) use of aspirin: Secondary | ICD-10-CM | POA: Diagnosis not present

## 2015-09-03 DIAGNOSIS — R319 Hematuria, unspecified: Secondary | ICD-10-CM | POA: Diagnosis present

## 2015-09-03 LAB — BASIC METABOLIC PANEL
Anion gap: 9 (ref 5–15)
BUN: 16 mg/dL (ref 6–20)
CHLORIDE: 98 mmol/L — AB (ref 101–111)
CO2: 34 mmol/L — AB (ref 22–32)
CREATININE: 2.85 mg/dL — AB (ref 0.61–1.24)
Calcium: 8.8 mg/dL — ABNORMAL LOW (ref 8.9–10.3)
GFR calc Af Amer: 27 mL/min — ABNORMAL LOW (ref >60)
GFR calc non Af Amer: 24 mL/min — ABNORMAL LOW (ref >60)
Glucose, Bld: 106 mg/dL — ABNORMAL HIGH (ref 65–99)
POTASSIUM: 4.4 mmol/L (ref 3.5–5.1)
SODIUM: 141 mmol/L (ref 135–145)

## 2015-09-03 LAB — CBC WITH DIFFERENTIAL/PLATELET
Basophils Absolute: 0 10*3/uL (ref 0.0–0.1)
Basophils Relative: 0 %
EOS PCT: 1 %
Eosinophils Absolute: 0.1 10*3/uL (ref 0.0–0.7)
HCT: 46.5 % (ref 39.0–52.0)
HEMOGLOBIN: 13.7 g/dL (ref 13.0–17.0)
LYMPHS ABS: 1.4 10*3/uL (ref 0.7–4.0)
Lymphocytes Relative: 24 %
MCH: 26.4 pg (ref 26.0–34.0)
MCHC: 29.5 g/dL — AB (ref 30.0–36.0)
MCV: 89.6 fL (ref 78.0–100.0)
MONOS PCT: 10 %
Monocytes Absolute: 0.6 10*3/uL (ref 0.1–1.0)
NEUTROS PCT: 65 %
Neutro Abs: 3.7 10*3/uL (ref 1.7–7.7)
Platelets: 193 10*3/uL (ref 150–400)
RBC: 5.19 MIL/uL (ref 4.22–5.81)
RDW: 19.3 % — ABNORMAL HIGH (ref 11.5–15.5)
WBC: 5.6 10*3/uL (ref 4.0–10.5)

## 2015-09-03 LAB — URINALYSIS, ROUTINE W REFLEX MICROSCOPIC
BILIRUBIN URINE: NEGATIVE
GLUCOSE, UA: NEGATIVE mg/dL
Ketones, ur: NEGATIVE mg/dL
Nitrite: NEGATIVE
PH: 7 (ref 5.0–8.0)
Protein, ur: >300 mg/dL — AB
Specific Gravity, Urine: 1.013 (ref 1.005–1.030)

## 2015-09-03 LAB — PROTIME-INR
INR: 1.1 (ref 0.00–1.49)
Prothrombin Time: 14.4 seconds (ref 11.6–15.2)

## 2015-09-03 LAB — URINE MICROSCOPIC-ADD ON: BACTERIA UA: NONE SEEN

## 2015-09-03 MED ORDER — CEPHALEXIN 250 MG PO CAPS
500.0000 mg | ORAL_CAPSULE | Freq: Once | ORAL | Status: AC
  Administered 2015-09-03: 500 mg via ORAL
  Filled 2015-09-03: qty 2

## 2015-09-03 MED ORDER — CEPHALEXIN 500 MG PO CAPS: 500.0000 mg | ORAL_CAPSULE | Freq: Two times a day (BID) | ORAL | Status: DC

## 2015-09-03 MED ORDER — SODIUM CHLORIDE 0.9 % IV SOLN
INTRAVENOUS | Status: DC
  Administered 2015-09-03: 12:00:00 via INTRAVENOUS

## 2015-09-03 NOTE — Discharge Instructions (Signed)

## 2015-09-03 NOTE — ED Provider Notes (Signed)
CSN: [id number]     Arrival date & time 09/03/15  1100 History   First MD Initiated Contact with Patient 09/03/15 1106     Chief Complaint  Patient presents with  . Hematuria    HPI Pt noticed when he woke up this am and he urinated the urine looked red.  He did not notice any clots.  No vomiting or diarrhea.  No fevers or chills.  He did notice that his left flank has been hurting for a whiel.    Still urinating normally.  No history of prior urinary issues.  Past Medical History  Diagnosis Date  . Hypertrophy of tonsils alone   . Chronic combined systolic and diastolic heart failure ([place])   . Obesity, unspecified   . Obstructive sleep apnea (adult) (pediatric)   . Hypertension   . CHF (congestive heart failure) [facility])    Past Surgical History  Procedure Laterality Date  . Umbilical hernia repair     Family History  Problem Relation Age of Onset  . Diabetes Brother   . Hypertension Mother   . Diabetes Sister   . Cancer Maternal Aunt    Social History  Substance Use Topics  . Smoking status: Never Smoker   . Smokeless tobacco: Never Used  . Alcohol Use: No    Review of Systems  Constitutional: Negative for fever.  Respiratory: Negative for cough and shortness of breath.   Genitourinary: Negative for urgency.  Musculoskeletal: Positive for back pain.  Skin: Negative for rash.  All other systems reviewed and are negative.     Allergies  Review of patient's allergies indicates no known allergies.  Home Medications   Prior to Admission medications   Medication Sig Start Date End Date Taking? Authorizing Provider  aspirin 325 MG tablet Take 325 mg by mouth daily.   Yes Historical Provider, MD  atorvastatin (LIPITOR) 40 MG tablet TAKE 1 TABLET BY MOUTH DAILY. 08/31/15  Yes Josalyn Funches, MD  calcium carbonate (TUMS - DOSED IN MG ELEMENTAL CALCIUM) 500 MG chewable tablet Chew 1 tablet (200 mg of elemental calcium total) by mouth 2 (two) times daily. 11/27/14  Yes Dixie Dials, MD  carvedilol (COREG) 3.125 MG tablet Take 1 tablet (3.125 mg total) by mouth 2 (two) times daily with a meal. 02/10/15  Yes Josalyn Funches, MD  furosemide (LASIX) 40 MG tablet Take 1 tablet (40 mg total) by mouth 2 (two) times daily. 06/27/15  Yes Josalyn Funches, MD  lisinopril (PRINIVIL,ZESTRIL) 20 MG tablet Take 1 tablet (20 mg total) by mouth daily. 06/26/15  Yes Josalyn Funches, MD  nitroGLYCERIN (NITROSTAT) 0.4 MG SL tablet Place 1 tablet (0.4 mg total) under the tongue every 5 (five) minutes x 3 doses as needed for chest pain. 12/17/12  Yes Dixie Dials, MD  potassium chloride (K-DUR) 10 MEQ tablet Take 10 mEq by mouth daily.   Yes Historical Provider, MD  Vitamin D, Ergocalciferol, (DRISDOL) 50000 units CAPS capsule Take 1 capsule (50,000 Units total) by mouth every 7 (seven) days. For 12 weeks 06/27/15  Yes Josalyn Funches, MD  cephALEXin (KEFLEX) 500 MG capsule Take 1 capsule (500 mg total) by mouth 2 (two) times daily. 09/03/15   Dorie Rank, MD   BP 159/95 mmHg  Pulse 65  Temp(Src) 97.3 F (36.3 C) (Oral)  Resp 18  Ht 5\' 11"  (1.803 m)  Wt 150.793 kg  BMI 46.39 kg/m2  SpO2 97% Physical Exam  Constitutional: No distress.  Obese   HENT:  Head:  Normocephalic and atraumatic.  Right Ear: External ear normal.  Left Ear: External ear normal.  Mouth/Throat: No oropharyngeal exudate.  Eyes: Conjunctivae are normal. Right eye exhibits no discharge. Left eye exhibits no discharge. No scleral icterus.  Neck: Neck supple. No tracheal deviation present.  Cardiovascular: Normal rate, regular rhythm and intact distal pulses.   Pulmonary/Chest: Effort normal and breath sounds normal. No stridor. No respiratory distress. He has no wheezes. He has no rales.  Abdominal: Soft. Bowel sounds are normal. He exhibits no distension. There is no tenderness. There is no rebound, no guarding and no CVA tenderness.  Musculoskeletal: He exhibits no edema or tenderness.  No rash, no deformity   Neurological: He is alert. He has normal strength. No cranial nerve deficit (no facial droop, extraocular movements intact, no slurred speech) or sensory deficit. He exhibits normal muscle tone. He displays no seizure activity. Coordination normal.  Skin: Skin is warm and dry. No rash noted. He is not diaphoretic.  Psychiatric: He has a normal mood and affect.  Nursing note and vitals reviewed.   ED Course  Procedures (including critical care time) Labs Review Labs Reviewed  URINALYSIS, ROUTINE W REFLEX MICROSCOPIC (NOT AT [facility]) - Abnormal; Notable for the following:    APPearance CLOUDY (*)    Hgb urine dipstick MODERATE (*)    Protein, ur >300 (*)    Leukocytes, UA MODERATE (*)    All other components within normal limits  BASIC METABOLIC PANEL - Abnormal; Notable for the following:    Chloride 98 (*)    CO2 34 (*)    Glucose, Bld 106 (*)    Creatinine, Ser 2.85 (*)    Calcium 8.8 (*)    GFR calc non Af Amer 24 (*)    GFR calc Af Amer 27 (*)    All other components within normal limits  CBC WITH DIFFERENTIAL/PLATELET - Abnormal; Notable for the following:    MCHC 29.5 (*)    RDW 19.3 (*)    All other components within normal limits  URINE MICROSCOPIC-ADD ON - Abnormal; Notable for the following:    Squamous Epithelial / LPF 0-5 (*)    All other components within normal limits  URINE CULTURE  PROTIME-INR    Imaging Review Ct Abdomen Pelvis Wo Contrast  09/03/2015  CLINICAL DATA:  Hematuria.  Left upper quadrant pain for a week EXAM: CT ABDOMEN AND PELVIS WITHOUT CONTRAST TECHNIQUE: Multidetector CT imaging of the abdomen and pelvis was performed following the standard protocol without IV contrast. COMPARISON:  None. FINDINGS: The lung bases are unremarkable. No free air or free fluid. No renal stones, perinephric stranding, or hydronephrosis. No ureterectasis or ureteral stones. No suspicious renal masses are seen. A 2.5 cm stone is seen in the gallbladder with no wall  thickening or pericholecystic fluid. The liver, spleen, adrenal glands, and pancreas are normal. The abdominal aorta is normal in caliber. There is a single borderline node in the left periaortic region on axial image 46 measuring 11 mm in short axis. Remainder of the visualized nodes are normal. The borderline node is probably reactive given the lack of other adenopathy. The stomach and small bowel are normal. There are scattered small diverticula in the colon with no diverticulitis. The appendix is not seen but there is no secondary evidence of appendicitis. No adenopathy seen in the pelvis. The bladder is normal as are the distal ureters. The prostate and seminal vesicles are unremarkable. No suspicious abnormalities seen in the pelvis. Degenerative  changes are seen the spine. IMPRESSION: 1. No cause for the patient's pain or hematuria identified. 2. Cholelithiasis.  Diverticulosis. Electronically Signed   By: Dorise Bullion III M.D   On: 09/03/2015 12:54   I have personally reviewed and evaluated these images and lab results as part of my medical decision-making.    MDM   Final diagnoses:  UTI (lower urinary tract infection)    Patient's CT scan does not show any acute abnormalities. Patient's electrolyte panel is consistent with his chronic renal failure. His urinalysis does suggest the possibility of urinary tract infection. I will discharge the patient home on a course of oral antibiotics and have him follow-up with his primary doctor.    Dorie Rank, MD 09/04/15 [phone]

## 2015-09-03 NOTE — ED Notes (Signed)
Pt states "this morning when I woke up i noticed i had pissed blood". Pt endorses pink urine. When asked about flank pain, pt states "ya know come to think of it my left flank has been hurting for a while".

## 2015-09-04 LAB — URINE CULTURE

## 2015-09-05 MED FILL — POTASSIUM CL 10 MEQ TAB SA: 10 | 30 days supply | Qty: 30 | Fill #0

## 2015-09-28 ENCOUNTER — Ambulatory Visit: Payer: Medicaid Other | Attending: Family Medicine | Admitting: Family Medicine

## 2015-09-28 ENCOUNTER — Encounter: Payer: Self-pay | Admitting: Family Medicine

## 2015-09-28 VITALS — BP 157/98 | HR 62 | Temp 98.0°F | Resp 16 | Ht 71.0 in | Wt 335.0 lb

## 2015-09-28 DIAGNOSIS — N184 Chronic kidney disease, stage 4 (severe): Secondary | ICD-10-CM | POA: Insufficient documentation

## 2015-09-28 DIAGNOSIS — I5042 Chronic combined systolic (congestive) and diastolic (congestive) heart failure: Secondary | ICD-10-CM | POA: Diagnosis not present

## 2015-09-28 DIAGNOSIS — Z7982 Long term (current) use of aspirin: Secondary | ICD-10-CM | POA: Insufficient documentation

## 2015-09-28 DIAGNOSIS — Z9981 Dependence on supplemental oxygen: Secondary | ICD-10-CM

## 2015-09-28 DIAGNOSIS — I129 Hypertensive chronic kidney disease with stage 1 through stage 4 chronic kidney disease, or unspecified chronic kidney disease: Secondary | ICD-10-CM | POA: Diagnosis present

## 2015-09-28 DIAGNOSIS — M25552 Pain in left hip: Secondary | ICD-10-CM | POA: Insufficient documentation

## 2015-09-28 DIAGNOSIS — I10 Essential (primary) hypertension: Secondary | ICD-10-CM

## 2015-09-28 DIAGNOSIS — E1121 Type 2 diabetes mellitus with diabetic nephropathy: Secondary | ICD-10-CM | POA: Insufficient documentation

## 2015-09-28 DIAGNOSIS — M25511 Pain in right shoulder: Secondary | ICD-10-CM | POA: Diagnosis not present

## 2015-09-28 DIAGNOSIS — Z79899 Other long term (current) drug therapy: Secondary | ICD-10-CM | POA: Diagnosis not present

## 2015-09-28 LAB — BASIC METABOLIC PANEL WITH GFR
BUN: 35 mg/dL — AB (ref 7–25)
CHLORIDE: 98 mmol/L (ref 98–110)
CO2: 33 mmol/L — ABNORMAL HIGH (ref 20–31)
Calcium: 8.4 mg/dL — ABNORMAL LOW (ref 8.6–10.3)
Creat: 3.06 mg/dL — ABNORMAL HIGH (ref 0.70–1.33)
GFR, Est African American: 26 mL/min — ABNORMAL LOW (ref >=60)
GFR, Est Non African American: 22 mL/min — ABNORMAL LOW (ref >=60)
GLUCOSE: 95 mg/dL (ref 65–99)
Potassium: 5 mmol/L (ref 3.5–5.3)
Sodium: 139 mmol/L (ref 135–146)

## 2015-09-28 MED ORDER — ACETAMINOPHEN-CODEINE #3 300-30 MG PO TABS: 1.0000 | ORAL_TABLET | Freq: Three times a day (TID) | ORAL | Status: DC | PRN

## 2015-09-28 MED ORDER — LISINOPRIL 40 MG PO TABS: 40.0000 mg | ORAL_TABLET | Freq: Every day | ORAL | Status: DC

## 2015-09-28 MED ORDER — ACETAMINOPHEN 500 MG PO TABS: 500.0000 mg | ORAL_TABLET | Freq: Four times a day (QID) | ORAL | Status: DC | PRN

## 2015-09-28 NOTE — Progress Notes (Signed)
Subjective:  Patient ID: Adam Dennis, male    DOB: 1961/02/10  Age: 55 y.o. MRN: [id number]  CC: Hypertension   HPI Adam Dennis has HTN, DM2, chronic combined CHF, obesity hypoventilation syndrome, CKD, O2 , OSA, O2 dependent he presents for   1. CHRONIC HYPERTENSION  Disease Monitoring  Blood pressure range: not checking   Chest pain: no   Dyspnea: yes   Claudication: no   Medication compliance: yes  Medication Side Effects  Lightheadedness: no   Urinary frequency: yes, taking lasix    Edema: yes   Impotence:    Preventitive Healthcare:  Exercise: no   Diet Pattern:   Salt Restriction: yes   2. Joint pain: low back, L hip, R shoulder: chronic pains. No injuries. Taking aspirin for pain. Pain is not controlled with ASA.   3. Combined CHF: compliant with coreg, lisinopril, lasix. SOB with exertion. On 2 L [place] during the day and CPAP at night. No CP. He needs a renewal order for home O2 to be sent to [place].   Social History  Substance Use Topics  . Smoking status: Never Smoker   . Smokeless tobacco: Never Used  . Alcohol Use: No   Outpatient Prescriptions Prior to Visit  Medication Sig Dispense Refill  . aspirin 325 MG tablet Take 325 mg by mouth daily.    Marland Kitchen atorvastatin (LIPITOR) 40 MG tablet TAKE 1 TABLET BY MOUTH DAILY. 30 tablet 2  . calcium carbonate (TUMS - DOSED IN MG ELEMENTAL CALCIUM) 500 MG chewable tablet Chew 1 tablet (200 mg of elemental calcium total) by mouth 2 (two) times daily.    . carvedilol (COREG) 3.125 MG tablet Take 1 tablet (3.125 mg total) by mouth 2 (two) times daily with a meal. 60 tablet 5  . furosemide (LASIX) 40 MG tablet Take 1 tablet (40 mg total) by mouth 2 (two) times daily. 60 tablet 2  . nitroGLYCERIN (NITROSTAT) 0.4 MG SL tablet Place 1 tablet (0.4 mg total) under the tongue every 5 (five) minutes x 3 doses as needed for chest pain. 25 tablet 1  . potassium chloride (K-DUR) 10 MEQ tablet Take 10 mEq by mouth  daily.    . Vitamin D, Ergocalciferol, (DRISDOL) 50000 units CAPS capsule Take 1 capsule (50,000 Units total) by mouth every 7 (seven) days. For 12 weeks 12 capsule 0  . lisinopril (PRINIVIL,ZESTRIL) 20 MG tablet Take 1 tablet (20 mg total) by mouth daily. 30 tablet 2  . cephALEXin (KEFLEX) 500 MG capsule Take 1 capsule (500 mg total) by mouth 2 (two) times daily. (Patient not taking: Reported on 09/28/2015) 20 capsule 0   No facility-administered medications prior to visit.    ROS Review of Systems  Constitutional: Negative for fever, chills, fatigue and unexpected weight change.  Eyes: Negative for visual disturbance.  Respiratory: Positive for shortness of breath. Negative for cough.   Cardiovascular: Positive for leg swelling. Negative for chest pain and palpitations.  Gastrointestinal: Negative for nausea, vomiting, abdominal pain, diarrhea, constipation and blood in stool.  Endocrine: Negative for polydipsia, polyphagia and polyuria.  Musculoskeletal: Positive for myalgias and arthralgias. Negative for back pain, gait problem and neck pain.  Skin: Negative for rash.  Allergic/Immunologic: Negative for immunocompromised state.  Hematological: Negative for adenopathy. Does not bruise/bleed easily.  Psychiatric/Behavioral: Negative for suicidal ideas, sleep disturbance and dysphoric mood. The patient is not nervous/anxious.     Objective:  BP 157/98 mmHg  Pulse 62  Temp(Src) 98 F (  36.7 C) (Oral)  Resp 16  Ht 5\' 11"  (1.803 m)  Wt 335 lb (151.955 kg)  BMI 46.74 kg/m2  SpO2 100%  BP/Weight 09/28/2015 09/03/2015 [id number]  Systolic BP [id number]  Diastolic BP 98 98 85  Wt. (Lbs) 335 332.44 339  BMI 46.74 46.39 47.3   O2 testing: RA resting 99% RA with exertion 76-88% 2 Lpm with exertion 100% Physical Exam  Constitutional: He appears well-developed and well-nourished. No distress.  Morbidly obese [name] in place   HENT:  Head: Normocephalic and atraumatic.  Neck: Normal range  of motion. Neck supple.  Cardiovascular: Normal rate, regular rhythm, normal heart sounds and intact distal pulses.   Pulmonary/Chest: Effort normal and breath sounds normal.  Musculoskeletal: He exhibits edema (trace LE ).       Right shoulder: He exhibits tenderness (anterior shoulder ) and pain.       Left hip: He exhibits tenderness (lateral hip pain ). He exhibits normal range of motion, normal strength, no bony tenderness, no crepitus, no deformity and no laceration.  Neurological: He is alert.  Skin: Skin is warm and dry. No rash noted. No erythema.  Psychiatric: He has a normal mood and affect.   Lab Results  Component Value Date   HGBA1C 6.0 06/26/2015   CBG 95  Assessment & Plan:  Adam Dennis was seen today for hypertension.  Diagnoses and all orders for this visit:  Chronic combined systolic and diastolic CHF (congestive heart failure) (HCC) -     lisinopril (PRINIVIL,ZESTRIL) 40 MG tablet; Take 1 tablet (40 mg total) by mouth daily.  Diabetic nephropathy associated with type 2 diabetes mellitus (HCC) -     BASIC METABOLIC PANEL WITH GFR  Right anterior shoulder pain -     acetaminophen-codeine (TYLENOL #3) 300-30 MG tablet; Take 1 tablet by mouth every 8 (eight) hours as needed. -     acetaminophen (TYLENOL) 500 MG tablet; Take 1 tablet (500 mg total) by mouth every 6 (six) hours as needed for mild pain or moderate pain.  Left hip pain -     acetaminophen-codeine (TYLENOL #3) 300-30 MG tablet; Take 1 tablet by mouth every 8 (eight) hours as needed. -     acetaminophen (TYLENOL) 500 MG tablet; Take 1 tablet (500 mg total) by mouth every 6 (six) hours as needed for mild pain or moderate pain.  O2 dependent  CKD (chronic kidney disease) stage 4, GFR 15-29 ml/min (HCC) -     US Renal; Future -     Ambulatory referral to Nephrology   There are no diagnoses linked to this encounter.  No orders of the defined types were placed in this encounter.    Follow-up: No  Follow-up on file.   Boykin Nearing MD

## 2015-09-28 NOTE — Progress Notes (Signed)
F/U HTN  C/C back pain lt hip pain and rt shoulder pain  Pain scale #7 No tobacco user  No suicidal thoughts in the past two weeks

## 2015-09-28 NOTE — Patient Instructions (Addendum)
Adam Dennis was seen today for hypertension.  Diagnoses and all orders for this visit:  Chronic combined systolic and diastolic CHF (congestive heart failure) (HCC) -     lisinopril (PRINIVIL,ZESTRIL) 40 MG tablet; Take 1 tablet (40 mg total) by mouth daily.  Diabetic nephropathy associated with type 2 diabetes mellitus (HCC) -     BASIC METABOLIC PANEL WITH GFR  Right anterior shoulder pain -     acetaminophen-codeine (TYLENOL #3) 300-30 MG tablet; Take 1 tablet by mouth every 8 (eight) hours as needed. -     acetaminophen (TYLENOL) 500 MG tablet; Take 1 tablet (500 mg total) by mouth every 6 (six) hours as needed for mild pain or moderate pain.  Left hip pain -     acetaminophen-codeine (TYLENOL #3) 300-30 MG tablet; Take 1 tablet by mouth every 8 (eight) hours as needed. -     acetaminophen (TYLENOL) 500 MG tablet; Take 1 tablet (500 mg total) by mouth every 6 (six) hours as needed for mild pain or moderate pain.   F/u in 6 weeks for HTN   Dr. Adrian Blackwater  Generic Hip Exercises RANGE OF MOTION (ROM) AND STRETCHING EXERCISES  These exercises may help you when beginning to rehabilitate your injury. Doing them too aggressively can worsen your condition. Complete them slowly and gently. Your symptoms may resolve with or without further involvement from your physician, physical therapist or athletic trainer. While completing these exercises, remember:   Restoring tissue flexibility helps normal motion to return to the joints. This allows healthier, less painful movement and activity.  An effective stretch should be held for at least 30 seconds.  A stretch should never be painful. You should only feel a gentle lengthening or release in the stretched tissue. If these stretches worsen your symptoms even when done gently, consult your physician, physical therapist or athletic trainer. STRETCH - Hamstrings, Supine   Lie on your back. Loop a belt or towel over the ball of your right / left  foot.  Straighten your right / left knee and slowly pull on the belt to raise your leg. Do not allow the right / left knee to bend. Keep your opposite leg flat on the floor.  Raise the leg until you feel a gentle stretch behind your right / left knee or thigh. Hold this position for __________ seconds. Repeat __________ times. Complete this stretch __________ times per day.  STRETCH - Hip Rotators   Lie on your back on a firm surface. Grasp your right / left knee with your right / left hand and your ankle with your opposite hand.  Keeping your hips and shoulders firmly planted, gently pull your right / left knee and rotate your lower leg toward your opposite shoulder until you feel a stretch in your buttocks.  Hold this stretch for __________ seconds. Repeat this stretch __________ times. Complete this stretch __________ times per day. STRETCH - Hamstrings/Adductors, V-Sit   Sit on the floor with your legs extended in a large "V," keeping your knees straight.  With your head and chest upright, bend at your waist reaching for your right foot to stretch your left adductors.  You should feel a stretch in your left inner thigh. Hold for __________ seconds.  Return to the upright position to relax your leg muscles.  Continuing to keep your chest upright, bend straight forward at your waist to stretch your hamstrings.  You should feel a stretch behind both of your thighs and/or knees. Hold for __________ seconds.  Return to the upright position to relax your leg muscles.  Repeat steps 2 through 4 for opposite leg. Repeat __________ times. Complete this exercise __________ times per day.  STRETCHING - Hip Flexors, Lunge  Half kneel with your right / left knee on the floor and your opposite knee bent and directly over your ankle.  Keep good posture with your head over your shoulders. Tighten your buttocks to point your tailbone downward; this will prevent your back from arching too  much.  You should feel a gentle stretch in the front of your thigh and/or hip. If you do not feel any resistance, slightly slide your opposite foot forward and then slowly lunge forward so your knee once again lines up over your ankle. Be sure your tailbone remains pointed downward.  Hold this stretch for __________ seconds. Repeat __________ times. Complete this stretch __________ times per day. STRENGTHENING EXERCISES These exercises may help you when beginning to rehabilitate your injury. They may resolve your symptoms with or without further involvement from your physician, physical therapist or athletic trainer. While completing these exercises, remember:   Muscles can gain both the endurance and the strength needed for everyday activities through controlled exercises.  Complete these exercises as instructed by your physician, physical therapist or athletic trainer. Progress the resistance and repetitions only as guided.  You may experience muscle soreness or fatigue, but the pain or discomfort you are trying to eliminate should never worsen during these exercises. If this pain does worsen, stop and make certain you are following the directions exactly. If the pain is still present after adjustments, discontinue the exercise until you can discuss the trouble with your clinician. STRENGTH - Hip Extensors, Bridge   Lie on your back on a firm surface. Bend your knees and place your feet flat on the floor.  Tighten your buttocks muscles and lift your bottom off the floor until your trunk is level with your thighs. You should feel the muscles in your buttocks and back of your thighs working. If you do not feel these muscles, slide your feet 1-2 inches further away from your buttocks.  Hold this position for __________ seconds.  Slowly lower your hips to the starting position and allow your buttock muscles relax completely before beginning the next repetition.  If this exercise is too easy,  you may cross your arms over your chest. Repeat __________ times. Complete this exercise __________ times per day.  STRENGTH - Hip Abductors, Straight Leg Raises  Be aware of your form throughout the entire exercise so that you exercise the correct muscles. Sloppy form means that you are not strengthening the correct muscles.  Lie on your side so that your head, shoulders, knee and hip line up. You may bend your lower knee to help maintain your balance. Your right / left leg should be on top.  Roll your hips slightly forward, so that your hips are stacked directly over each other and your right / left knee is facing forward.  Lift your top leg up 4-6 inches, leading with your heel. Be sure that your foot does not drift forward or that your knee does not roll toward the ceiling.  Hold this position for __________ seconds. You should feel the muscles in your outer hip lifting (you may not notice this until your leg begins to tire).  Slowly lower your leg to the starting position. Allow the muscles to fully relax before beginning the next repetition. Repeat __________ times. Complete this exercise __________  times per day.  STRENGTH - Hip Adductors, Straight Leg Raises   Lie on your side so that your head, shoulders, knee and hip line up. You may place your upper foot in front to help maintain your balance. Your right / left leg should be on the bottom.  Roll your hips slightly forward, so that your hips are stacked directly over each other and your right / left knee is facing forward.  Tense the muscles in your inner thigh and lift your bottom leg 4-6 inches. Hold this position for __________ seconds.  Slowly lower your leg to the starting position. Allow the muscles to fully relax before beginning the next repetition. Repeat __________ times. Complete this exercise __________ times per day.  STRENGTH - Quadriceps, Straight Leg Raises  Quality counts! Watch for signs that the quadriceps  muscle is working to insure you are strengthening the correct muscles and not "cheating" by substituting with healthier muscles.  Lay on your back with your right / left leg extended and your opposite knee bent.  Tense the muscles in the front of your right / left thigh. You should see either your knee cap slide up or increased dimpling just above the knee. Your thigh may even quiver.  Tighten these muscles even more and raise your leg 4 to 6 inches off the floor. Hold for right / left seconds.  Keeping these muscles tense, lower your leg.  Relax the muscles slowly and completely in between each repetition. Repeat __________ times. Complete this exercise __________ times per day.  STRENGTH - Hip Abductors, Standing  Tie one end of a rubber exercise band/tubing to a secure surface (table, pole) and tie a loop at the other end.  Place the loop around your right / left ankle. Keeping your ankle with the band directly opposite of the secured end, step away until there is tension in the tube/band.  Hold onto a chair as needed for balance.  Keeping your back upright, your shoulders over your hips, and your toes pointing forward, lift your right / left leg out to your side. Be sure to lift your leg with your hip muscles. Do not "throw" your leg or tip your body to lift your leg.  Slowly and with control, return to the starting position. Repeat exercise __________ times. Complete this exercise __________ times per day.  STRENGTH - Quadriceps, Squats  Stand in a door frame so that your feet and knees are in line with the frame.  Use your hands for balance, not support, on the frame.  Slowly lower your weight, bending at the hips and knees. Keep your lower legs upright so that they are parallel with the door frame. Squat only within the range that does not increase your knee pain. Never let your hips drop below your knees.  Slowly return upright, pushing with your legs, not pulling with your  hands.   This information is not intended to replace advice given to you by your health care provider. Make sure you discuss any questions you have with your health care provider.   Document Released: 05/31/2005 Document Revised: 06/03/2014 Document Reviewed: 08/25/2008 Elsevier Interactive Patient Education 2016 [employer] Generic Shoulder Exercises EXERCISES  RANGE OF MOTION (ROM) AND STRETCHING EXERCISES These exercises may help you when beginning to rehabilitate your injury. Your symptoms may resolve with or without further involvement from your physician, physical therapist or athletic trainer. While completing these exercises, remember:   Restoring tissue flexibility helps normal motion to return  to the joints. This allows healthier, less painful movement and activity.  An effective stretch should be held for at least 30 seconds.  A stretch should never be painful. You should only feel a gentle lengthening or release in the stretched tissue. ROM - Pendulum  Bend at the waist so that your right / left arm falls away from your body. Support yourself with your opposite hand on a solid surface, such as a table or a countertop.  Your right / left arm should be perpendicular to the ground. If it is not perpendicular, you need to lean over farther. Relax the muscles in your right / left arm and shoulder as much as possible.  Gently sway your hips and trunk so they move your right / left arm without any use of your right / left shoulder muscles.  Progress your movements so that your right / left arm moves side to side, then forward and backward, and finally, both clockwise and counterclockwise.  Complete __________ repetitions in each direction. Many people use this exercise to relieve discomfort in their shoulder as well as to gain range of motion. Repeat __________ times. Complete this exercise __________ times per day. STRETCH - Flexion, Standing  Stand with good posture. With an  underhand grip on your right / left hand and an overhand grip on the opposite hand, grasp a broomstick or cane so that your hands are a little more than shoulder-width apart.  Keeping your right / left elbow straight and shoulder muscles relaxed, push the stick with your opposite hand to raise your right / left arm in front of your body and then overhead. Raise your arm until you feel a stretch in your right / left shoulder, but before you have increased shoulder pain.  Try to avoid shrugging your right / left shoulder as your arm rises by keeping your shoulder blade tucked down and toward your mid-back spine. Hold __________ seconds.  Slowly return to the starting position. Repeat __________ times. Complete this exercise __________ times per day. STRETCH - Internal Rotation  Place your right / left hand behind your back, palm-up.  Throw a towel or belt over your opposite shoulder. Grasp the towel/belt with your right / left hand.  While keeping an upright posture, gently pull up on the towel/belt until you feel a stretch in the front of your right / left shoulder.  Avoid shrugging your right / left shoulder as your arm rises by keeping your shoulder blade tucked down and toward your mid-back spine.  Hold __________. Release the stretch by lowering your opposite hand. Repeat __________ times. Complete this exercise __________ times per day. STRETCH - External Rotation and Abduction  Stagger your stance through a doorframe. It does not matter which foot is forward.  As instructed by your physician, physical therapist or athletic trainer, place your hands:  And forearms above your head and on the door frame.  And forearms at head-height and on the door frame.  At elbow-height and on the door frame.  Keeping your head and chest upright and your stomach muscles tight to prevent over-extending your low-back, slowly shift your weight onto your front foot until you feel a stretch across your  chest and/or in the front of your shoulders.  Hold __________ seconds. Shift your weight to your back foot to release the stretch. Repeat __________ times. Complete this stretch __________ times per day.  STRENGTHENING EXERCISES  These exercises may help you when beginning to rehabilitate your injury. They  may resolve your symptoms with or without further involvement from your physician, physical therapist or athletic trainer. While completing these exercises, remember:   Muscles can gain both the endurance and the strength needed for everyday activities through controlled exercises.  Complete these exercises as instructed by your physician, physical therapist or athletic trainer. Progress the resistance and repetitions only as guided.  You may experience muscle soreness or fatigue, but the pain or discomfort you are trying to eliminate should never worsen during these exercises. If this pain does worsen, stop and make certain you are following the directions exactly. If the pain is still present after adjustments, discontinue the exercise until you can discuss the trouble with your clinician.  If advised by your physician, during your recovery, avoid activity or exercises which involve actions that place your right / left hand or elbow above your head or behind your back or head. These positions stress the tissues which are trying to heal. STRENGTH - Scapular Depression and Adduction  With good posture, sit on a firm chair. Supported your arms in front of you with pillows, arm rests or a table top. Have your elbows in line with the sides of your body.  Gently draw your shoulder blades down and toward your mid-back spine. Gradually increase the tension without tensing the muscles along the top of your shoulders and the back of your neck.  Hold for __________ seconds. Slowly release the tension and relax your muscles completely before completing the next repetition.  After you have practiced  this exercise, remove the arm support and complete it in standing as well as sitting. Repeat __________ times. Complete this exercise __________ times per day.  STRENGTH - External Rotators  Secure a rubber exercise band/tubing to a fixed object so that it is at the same height as your right / left elbow when you are standing or sitting on a firm surface.  Stand or sit so that the secured exercise band/tubing is at your side that is not injured.  Bend your elbow 90 degrees. Place a folded towel or small pillow under your right / left arm so that your elbow is a few inches away from your side.  Keeping the tension on the exercise band/tubing, pull it away from your body, as if pivoting on your elbow. Be sure to keep your body steady so that the movement is only coming from your shoulder rotating.  Hold __________ seconds. Release the tension in a controlled manner as you return to the starting position. Repeat __________ times. Complete this exercise __________ times per day.  STRENGTH - Supraspinatus  Stand or sit with good posture. Grasp a __________ weight or an exercise band/tubing so that your hand is "thumbs-up," like when you shake hands.  Slowly lift your right / left hand from your thigh into the air, traveling about 30 degrees from straight out at your side. Lift your hand to shoulder height or as far as you can without increasing any shoulder pain. Initially, many people do not lift their hands above shoulder height.  Avoid shrugging your right / left shoulder as your arm rises by keeping your shoulder blade tucked down and toward your mid-back spine.  Hold for __________ seconds. Control the descent of your hand as you slowly return to your starting position. Repeat __________ times. Complete this exercise __________ times per day.  STRENGTH - Shoulder Extensors  Secure a rubber exercise band/tubing so that it is at the height of your shoulders when you  are either standing or  sitting on a firm arm-less chair.  With a thumbs-up grip, grasp an end of the band/tubing in each hand. Straighten your elbows and lift your hands straight in front of you at shoulder height. Step back away from the secured end of band/tubing until it becomes tense.  Squeezing your shoulder blades together, pull your hands down to the sides of your thighs. Do not allow your hands to go behind you.  Hold for __________ seconds. Slowly ease the tension on the band/tubing as you reverse the directions and return to the starting position. Repeat __________ times. Complete this exercise __________ times per day.  STRENGTH - Scapular Retractors  Secure a rubber exercise band/tubing so that it is at the height of your shoulders when you are either standing or sitting on a firm arm-less chair.  With a palm-down grip, grasp an end of the band/tubing in each hand. Straighten your elbows and lift your hands straight in front of you at shoulder height. Step back away from the secured end of band/tubing until it becomes tense.  Squeezing your shoulder blades together, draw your elbows back as you bend them. Keep your upper arm lifted away from your body throughout the exercise.  Hold __________ seconds. Slowly ease the tension on the band/tubing as you reverse the directions and return to the starting position. Repeat __________ times. Complete this exercise __________ times per day. STRENGTH - Scapular Depressors  Find a sturdy chair without wheels, such as a from a dining room table.  Keeping your feet on the floor, lift your bottom from the seat and lock your elbows.  Keeping your elbows straight, allow gravity to pull your body weight down. Your shoulders will rise toward your ears.  Raise your body against gravity by drawing your shoulder blades down your back, shortening the distance between your shoulders and ears. Although your feet should always maintain contact with the floor, your feet should  progressively support less body weight as you get stronger.  Hold __________ seconds. In a controlled and slow manner, lower your body weight to begin the next repetition. Repeat __________ times. Complete this exercise __________ times per day.    This information is not intended to replace advice given to you by your health care provider. Make sure you discuss any questions you have with your health care provider.   Document Released: 03/27/2005 Document Revised: 06/03/2014 Document Reviewed: 08/25/2008 Elsevier Interactive Patient Education [employer].

## 2015-09-29 MED FILL — LISINOPRIL 40 MG TABLET: 40 | 30 days supply | Qty: 30 | Fill #0

## 2015-10-03 MED FILL — ATORVASTATIN 40 MG TABLET: 40 | 30 days supply | Qty: 30 | Fill #0

## 2015-10-03 MED FILL — CARVEDILOL 3.125 MG TABLET: 3.125 | 30 days supply | Qty: 60 | Fill #2

## 2015-10-03 MED FILL — LISINOPRIL 20 MG TABLET: 20 | 30 days supply | Qty: 30 | Fill #1

## 2015-10-03 MED FILL — POTASSIUM CL 10 MEQ TAB SA: 10 | 30 days supply | Qty: 30 | Fill #1

## 2015-10-04 ENCOUNTER — Telehealth: Payer: Self-pay | Admitting: *Deleted

## 2015-10-04 DIAGNOSIS — N184 Chronic kidney disease, stage 4 (severe): Secondary | ICD-10-CM | POA: Insufficient documentation

## 2015-10-04 DIAGNOSIS — M25552 Pain in left hip: Secondary | ICD-10-CM | POA: Insufficient documentation

## 2015-10-04 DIAGNOSIS — M25511 Pain in right shoulder: Secondary | ICD-10-CM | POA: Insufficient documentation

## 2015-10-04 DIAGNOSIS — N186 End stage renal disease: Secondary | ICD-10-CM | POA: Insufficient documentation

## 2015-10-04 NOTE — Assessment & Plan Note (Signed)
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

## 2015-10-04 NOTE — Assessment & Plan Note (Signed)
L lateral hip  pain  Suspect bursitis  Patient with CKD Plan tylenol #3 for pain control

## 2015-10-04 NOTE — Assessment & Plan Note (Signed)
A: elevated BP in setting of worsening CKD, chronic CHF Med: compliant  P: Increase lisinopril to 40 mg daily Renal US Renal referral

## 2015-10-04 NOTE — Assessment & Plan Note (Signed)
Worsening CKD in setting of HTN and diabetes  Plan Renal US Renal referral Improved BP control, lisinopril dose increased

## 2015-10-04 NOTE — Telephone Encounter (Signed)
-----   Message from Boykin Nearing, MD sent at 10/04/2015  9:02 AM EDT ----- Worsening kidney function Tight BP and blood sugar control needed Renal ultrasound ordered and needs to be scheduled  Nephrology evaluation needed to due decline in kidney function

## 2015-10-04 NOTE — Assessment & Plan Note (Signed)
Anterior shoulder pain  Suspect arthritis Patient with CKD Plan tylenol #3 for pain control

## 2015-10-04 NOTE — Telephone Encounter (Signed)
Date of birth verified by pt  Lab results given, kidney function worsening  [place] appointment on May 17 , 2017 at 130p arriving 15 min early  Drink 16 Oz H2O prior to [place] Referral Nephrology placed  Pt verbalized understanding

## 2015-10-11 ENCOUNTER — Ambulatory Visit (HOSPITAL_COMMUNITY)
Admission: RE | Admit: 2015-10-11 | Discharge: 2015-10-11 | Disposition: A | Discharge status: Home / Self Care | Payer: Medicaid Other | Source: Ambulatory Visit | Attending: Family Medicine | Admitting: Family Medicine

## 2015-10-11 DIAGNOSIS — N184 Chronic kidney disease, stage 4 (severe): Secondary | ICD-10-CM | POA: Insufficient documentation

## 2015-10-11 DIAGNOSIS — K8020 Calculus of gallbladder without cholecystitis without obstruction: Secondary | ICD-10-CM | POA: Insufficient documentation

## 2015-10-26 ENCOUNTER — Telehealth: Payer: Self-pay | Admitting: *Deleted

## 2015-10-26 NOTE — Telephone Encounter (Signed)
-----   Message from Boykin Nearing, MD sent at 10/11/2015  8:54 PM EDT ----- Renal ultrasound reveals changes consistent with HTN and Diabetes There is also a gallstone noted Patient should work on good BP and blood sugar control and proceed with renal referral for consult and added care

## 2015-10-26 NOTE — Telephone Encounter (Signed)
LVM to return call.

## 2015-10-26 NOTE — Telephone Encounter (Signed)
Pt. Returned call. Please f/u with pt. °

## 2015-10-30 NOTE — Telephone Encounter (Signed)
Pt. Returned nurses call. Please f/u with pt.

## 2015-11-01 MED FILL — ATORVASTATIN 40 MG TABLET: 40 | 30 days supply | Qty: 30 | Fill #1

## 2015-11-01 MED FILL — FUROSEMIDE 40 MG TABLET: 40 | 30 days supply | Qty: 90 | Fill #2

## 2015-11-01 MED FILL — TRAVATAN Z 0.004% EYE DROP: 0.004 | 30 days supply | Qty: 5 | Fill #2

## 2015-11-10 MED FILL — CARVEDILOL 3.125 MG TABLET: 3.125 | 30 days supply | Qty: 60 | Fill #3

## 2015-11-10 MED FILL — POTASSIUM CL 10 MEQ TAB SA: 10 | 30 days supply | Qty: 30 | Fill #2

## 2015-11-13 MED FILL — ?LISINOPRIL 20 MG TABLET: 20 | 30 days supply | Qty: 30 | Fill #2

## 2015-11-20 MED FILL — FUROSEMIDE 40 MG TABLET: 40 | 30 days supply | Qty: 60 | Fill #0

## 2015-11-30 ENCOUNTER — Encounter: Payer: Self-pay | Admitting: Family Medicine

## 2015-11-30 ENCOUNTER — Ambulatory Visit: Payer: Medicaid Other | Attending: Family Medicine | Admitting: Family Medicine

## 2015-11-30 VITALS — BP 133/86 | HR 66 | Temp 98.0°F | Resp 14 | Wt 328.0 lb

## 2015-11-30 DIAGNOSIS — I10 Essential (primary) hypertension: Secondary | ICD-10-CM | POA: Diagnosis not present

## 2015-11-30 DIAGNOSIS — N184 Chronic kidney disease, stage 4 (severe): Secondary | ICD-10-CM | POA: Insufficient documentation

## 2015-11-30 DIAGNOSIS — H409 Unspecified glaucoma: Secondary | ICD-10-CM | POA: Insufficient documentation

## 2015-11-30 DIAGNOSIS — H4010X0 Unspecified open-angle glaucoma, stage unspecified: Secondary | ICD-10-CM | POA: Insufficient documentation

## 2015-11-30 DIAGNOSIS — E1169 Type 2 diabetes mellitus with other specified complication: Secondary | ICD-10-CM

## 2015-11-30 DIAGNOSIS — M25511 Pain in right shoulder: Secondary | ICD-10-CM | POA: Diagnosis not present

## 2015-11-30 DIAGNOSIS — Z79899 Other long term (current) drug therapy: Secondary | ICD-10-CM | POA: Diagnosis not present

## 2015-11-30 DIAGNOSIS — I129 Hypertensive chronic kidney disease with stage 1 through stage 4 chronic kidney disease, or unspecified chronic kidney disease: Secondary | ICD-10-CM | POA: Insufficient documentation

## 2015-11-30 DIAGNOSIS — E669 Obesity, unspecified: Secondary | ICD-10-CM | POA: Diagnosis not present

## 2015-11-30 DIAGNOSIS — M25552 Pain in left hip: Secondary | ICD-10-CM | POA: Diagnosis not present

## 2015-11-30 DIAGNOSIS — E559 Vitamin D deficiency, unspecified: Secondary | ICD-10-CM | POA: Insufficient documentation

## 2015-11-30 DIAGNOSIS — Z7982 Long term (current) use of aspirin: Secondary | ICD-10-CM | POA: Diagnosis not present

## 2015-11-30 DIAGNOSIS — E1122 Type 2 diabetes mellitus with diabetic chronic kidney disease: Secondary | ICD-10-CM | POA: Insufficient documentation

## 2015-11-30 DIAGNOSIS — G8929 Other chronic pain: Secondary | ICD-10-CM | POA: Insufficient documentation

## 2015-11-30 DIAGNOSIS — E119 Type 2 diabetes mellitus without complications: Secondary | ICD-10-CM | POA: Diagnosis not present

## 2015-11-30 LAB — GLUCOSE, POCT (MANUAL RESULT ENTRY): POC Glucose: 77 mg/dl (ref 70–99)

## 2015-11-30 LAB — POCT GLYCOSYLATED HEMOGLOBIN (HGB A1C): HEMOGLOBIN A1C: 6

## 2015-11-30 MED ORDER — ACETAMINOPHEN-CODEINE #3 300-30 MG PO TABS: 1.0000 | ORAL_TABLET | Freq: Three times a day (TID) | ORAL | Status: DC | PRN

## 2015-11-30 MED FILL — ACETAMINOPHEN/COD #3 TABLET: 300-30 | 20 days supply | Qty: 60 | Fill #0

## 2015-11-30 NOTE — Patient Instructions (Addendum)
Adam Dennis was seen today for follow-up.  Diagnoses and all orders for this visit:  Right anterior shoulder pain -     acetaminophen-codeine (TYLENOL #3) 300-30 MG tablet; Take 1 tablet by mouth every 8 (eight) hours as needed.  Left hip pain -     acetaminophen-codeine (TYLENOL #3) 300-30 MG tablet; Take 1 tablet by mouth every 8 (eight) hours as needed.  Essential hypertension, benign  CKD (chronic kidney disease) stage 4, GFR 15-29 ml/min (HCC) -     BASIC METABOLIC PANEL WITH GFR  Diabetes mellitus type 2 in obese (HCC) -     HgB A1c -     Glucose (CBG)  Vitamin D deficiency -     Vitamin D, 25-hydroxy  Open-angle glaucoma of right eye, unspecified glaucoma stage, unspecified open-angle glaucoma type   Your blood pressure and weight are both down  Continue the current regimen  F/u in 3 months for HTN and diabetes   Dr. Adrian Blackwater

## 2015-11-30 NOTE — Progress Notes (Signed)
Pt here for F/U for HTN and shoulder pain. Pt rated at a 9 described as aching and sore. Pt reports pain in his left leg that started about a few weeks ago.  Pt needs a refill on tylenol #3. Pt reports he never got the last rx filled and doesn't have the rx. Pt has taken his medications this morning. Pt is currently on 2L of O2.   Pt CBG is 77 and A1C is 6.

## 2015-11-30 NOTE — Progress Notes (Signed)
Subjective:  Patient ID: Adam Dennis, male    DOB: 1960-10-16  Age: 55 y.o. MRN: [id number]  CC: Hypertension and Shoulder Pain   HPI Adam Dennis presents for   1. HTN: compliant with medication regimen. Has chronic SOB. No CP or leg swelling. Diuresing well with lasix.   2. R shoulder pain: chronic pain. Pain with raising arm. No trauma. Pain does not radiate down her arm.   Social History  Substance Use Topics  . Smoking status: Never Smoker   . Smokeless tobacco: Never Used  . Alcohol Use: No    Outpatient Prescriptions Prior to Visit  Medication Sig Dispense Refill  . aspirin 325 MG tablet Take 325 mg by mouth daily.    Marland Kitchen atorvastatin (LIPITOR) 40 MG tablet TAKE 1 TABLET BY MOUTH DAILY. 30 tablet 2  . calcium carbonate (TUMS - DOSED IN MG ELEMENTAL CALCIUM) 500 MG chewable tablet Chew 1 tablet (200 mg of elemental calcium total) by mouth 2 (two) times daily.    . carvedilol (COREG) 3.125 MG tablet Take 1 tablet (3.125 mg total) by mouth 2 (two) times daily with a meal. 60 tablet 5  . furosemide (LASIX) 40 MG tablet Take 1 tablet (40 mg total) by mouth 2 (two) times daily. 60 tablet 2  . lisinopril (PRINIVIL,ZESTRIL) 40 MG tablet Take 1 tablet (40 mg total) by mouth daily. 30 tablet 5  . nitroGLYCERIN (NITROSTAT) 0.4 MG SL tablet Place 1 tablet (0.4 mg total) under the tongue every 5 (five) minutes x 3 doses as needed for chest pain. 25 tablet 1  . potassium chloride (K-DUR) 10 MEQ tablet Take 10 mEq by mouth daily.    Marland Kitchen acetaminophen-codeine (TYLENOL #3) 300-30 MG tablet Take 1 tablet by mouth every 8 (eight) hours as needed. 60 tablet 0  . acetaminophen (TYLENOL) 500 MG tablet Take 1 tablet (500 mg total) by mouth every 6 (six) hours as needed for mild pain or moderate pain. (Patient not taking: Reported on 11/30/2015) 60 tablet 0  . Vitamin D, Ergocalciferol, (DRISDOL) 50000 units CAPS capsule Take 1 capsule (50,000 Units total) by mouth every 7 (seven) days. For 12 weeks  (Patient not taking: Reported on 11/30/2015) 12 capsule 0   No facility-administered medications prior to visit.    ROS Review of Systems  Constitutional: Negative for fever, chills, fatigue and unexpected weight change.  Eyes: Positive for visual disturbance (intermittent blurriness in both eyes ). Negative for pain.  Respiratory: Positive for shortness of breath. Negative for cough.   Cardiovascular: Negative for chest pain, palpitations and leg swelling.  Gastrointestinal: Negative for nausea, vomiting, abdominal pain, diarrhea, constipation and blood in stool.  Endocrine: Negative for polydipsia, polyphagia and polyuria.  Musculoskeletal: Positive for myalgias and arthralgias. Negative for back pain, gait problem and neck pain.  Skin: Negative for rash.  Allergic/Immunologic: Negative for immunocompromised state.  Hematological: Negative for adenopathy. Does not bruise/bleed easily.  Psychiatric/Behavioral: Negative for suicidal ideas, sleep disturbance and dysphoric mood. The patient is not nervous/anxious.     Objective:  BP 133/86 mmHg  Pulse 66  Temp(Src) 98 F (36.7 C) (Oral)  Resp 14  Wt 328 lb (148.78 kg)  SpO2 100%  BP/Weight 11/30/2015 [id number] [id number]  Systolic BP Q000111Q [id number]  Diastolic BP 86 98 98  Wt. (Lbs) 328 335 332.44  BMI 45.77 46.74 46.39   Wt Readings from Last 3 Encounters:  11/30/15 328 lb (148.78 kg)  09/28/15 335 lb (151.955 kg)  09/03/15  332 lb 7 oz (150.793 kg)    Physical Exam  Constitutional: He appears well-developed and well-nourished. No distress.  Morbidly obese [place] in place   HENT:  Head: Normocephalic and atraumatic.  Neck: Normal range of motion. Neck supple.  Cardiovascular: Normal rate, regular rhythm, normal heart sounds and intact distal pulses.   Pulmonary/Chest: Effort normal and breath sounds normal.  Musculoskeletal: He exhibits no edema.       Right shoulder: He exhibits tenderness (anterior shoulder ) and pain.       Left  hip: He exhibits tenderness (lateral hip pain ). He exhibits normal range of motion, normal strength, no bony tenderness, no crepitus, no deformity and no laceration.  Neurological: He is alert.  Skin: Skin is warm and dry. No rash noted. No erythema.  Psychiatric: He has a normal mood and affect.    Lab Results  Component Value Date   HGBA1C 6 11/30/2015   Lab Results  Component Value Date   HGBA1C 6.0 06/26/2015    CBG 77 Assessment & Plan:   Adam Dennis was seen today for hypertension and shoulder pain.  Diagnoses and all orders for this visit:  Right anterior shoulder pain -     acetaminophen-codeine (TYLENOL #3) 300-30 MG tablet; Take 1 tablet by mouth every 8 (eight) hours as needed.  Left hip pain -     acetaminophen-codeine (TYLENOL #3) 300-30 MG tablet; Take 1 tablet by mouth every 8 (eight) hours as needed.  Essential hypertension, benign  CKD (chronic kidney disease) stage 4, GFR 15-29 ml/min (HCC) -     BASIC METABOLIC PANEL WITH GFR  Diabetes mellitus type 2 in obese (HCC) -     HgB A1c -     Glucose (CBG)  Vitamin D deficiency -     Vitamin D, 25-hydroxy  Open-angle glaucoma of right eye, unspecified glaucoma stage, unspecified open-angle glaucoma type    Meds ordered this encounter  Medications  . acetaminophen-codeine (TYLENOL #3) 300-30 MG tablet    Sig: Take 1 tablet by mouth every 8 (eight) hours as needed.    Dispense:  60 tablet    Refill:  2    Follow-up: Return in about 3 months (around 03/01/2016) for HTN and diabetes .   Boykin Nearing MD

## 2015-12-01 LAB — VITAMIN D 25 HYDROXY (VIT D DEFICIENCY, FRACTURES): VIT D 25 HYDROXY: 25 ng/mL — AB (ref 30–100)

## 2015-12-03 DIAGNOSIS — E559 Vitamin D deficiency, unspecified: Secondary | ICD-10-CM | POA: Insufficient documentation

## 2015-12-03 NOTE — Assessment & Plan Note (Signed)
A: chronic R shoulder pain P: Continue tylenol #3

## 2015-12-03 NOTE — Assessment & Plan Note (Signed)
Slight worsening CKD Renal referral placed Tight control of blood pressure and blood sugar

## 2015-12-03 NOTE — Assessment & Plan Note (Signed)
A: improved in setting of CKD P: Continue current regimen BMP with GFR reveals rise in Cr and decline in GFR Renal US obtained after last visit revealed medical renal disease  Another renal referral placed

## 2015-12-04 LAB — BASIC METABOLIC PANEL WITH GFR
BUN: 30 mg/dL — ABNORMAL HIGH (ref 7–25)
CO2: 25 mmol/L (ref 20–31)
Calcium: 8.6 mg/dL (ref 8.6–10.3)
Chloride: 101 mmol/L (ref 98–110)
Creat: 2.89 mg/dL — ABNORMAL HIGH (ref 0.70–1.33)
GFR, Est African American: 27 mL/min — ABNORMAL LOW (ref >=60)
GFR, Est Non African American: 24 mL/min — ABNORMAL LOW (ref >=60)
Glucose, Bld: 73 mg/dL (ref 65–99)
Potassium: 4.6 mmol/L (ref 3.5–5.3)
Sodium: 141 mmol/L (ref 135–146)

## 2015-12-06 ENCOUNTER — Telehealth: Payer: Self-pay

## 2015-12-06 MED FILL — ATORVASTATIN 40 MG TABLET: 40 | 30 days supply | Qty: 30 | Fill #2

## 2015-12-06 NOTE — Telephone Encounter (Signed)
Contacted patient to go over lab results pt is aware of lab results and recommendations by Dr. Janne Napoleon

## 2015-12-11 ENCOUNTER — Other Ambulatory Visit: Payer: Self-pay

## 2015-12-11 ENCOUNTER — Emergency Department (HOSPITAL_COMMUNITY)
Admission: EM | Admit: 2015-12-11 | Discharge: 2015-12-11 | Disposition: A | Discharge status: Home / Self Care | Payer: Medicaid Other | Attending: Emergency Medicine | Admitting: Emergency Medicine

## 2015-12-11 ENCOUNTER — Emergency Department (HOSPITAL_COMMUNITY): Payer: Medicaid Other

## 2015-12-11 ENCOUNTER — Encounter (HOSPITAL_COMMUNITY): Payer: Self-pay | Admitting: *Deleted

## 2015-12-11 DIAGNOSIS — I509 Heart failure, unspecified: Secondary | ICD-10-CM | POA: Insufficient documentation

## 2015-12-11 DIAGNOSIS — I110 Hypertensive heart disease with heart failure: Secondary | ICD-10-CM | POA: Insufficient documentation

## 2015-12-11 DIAGNOSIS — M25511 Pain in right shoulder: Secondary | ICD-10-CM | POA: Insufficient documentation

## 2015-12-11 DIAGNOSIS — Z79899 Other long term (current) drug therapy: Secondary | ICD-10-CM | POA: Diagnosis not present

## 2015-12-11 DIAGNOSIS — R079 Chest pain, unspecified: Secondary | ICD-10-CM | POA: Diagnosis present

## 2015-12-11 DIAGNOSIS — Z7982 Long term (current) use of aspirin: Secondary | ICD-10-CM | POA: Diagnosis not present

## 2015-12-11 DIAGNOSIS — R0789 Other chest pain: Secondary | ICD-10-CM | POA: Insufficient documentation

## 2015-12-11 LAB — BASIC METABOLIC PANEL
ANION GAP: 6 (ref 5–15)
BUN: 26 mg/dL — ABNORMAL HIGH (ref 6–20)
CALCIUM: 8.6 mg/dL — AB (ref 8.9–10.3)
CO2: 29 mmol/L (ref 22–32)
CREATININE: 2.61 mg/dL — AB (ref 0.61–1.24)
Chloride: 102 mmol/L (ref 101–111)
GFR, EST AFRICAN AMERICAN: 31 mL/min — AB (ref >60)
GFR, EST NON AFRICAN AMERICAN: 26 mL/min — AB (ref >60)
Glucose, Bld: 103 mg/dL — ABNORMAL HIGH (ref 65–99)
Potassium: 4.3 mmol/L (ref 3.5–5.1)
SODIUM: 137 mmol/L (ref 135–145)

## 2015-12-11 LAB — CBC WITH DIFFERENTIAL/PLATELET
BASOS ABS: 0 10*3/uL (ref 0.0–0.1)
Basophils Relative: 0 %
EOS ABS: 0.1 10*3/uL (ref 0.0–0.7)
EOS PCT: 1 %
HCT: 41.9 % (ref 39.0–52.0)
Hemoglobin: 12.9 g/dL — ABNORMAL LOW (ref 13.0–17.0)
Lymphocytes Relative: 27 %
Lymphs Abs: 1.6 10*3/uL (ref 0.7–4.0)
MCH: 28.4 pg (ref 26.0–34.0)
MCHC: 30.8 g/dL (ref 30.0–36.0)
MCV: 92.1 fL (ref 78.0–100.0)
MONO ABS: 0.7 10*3/uL (ref 0.1–1.0)
Monocytes Relative: 11 %
Neutro Abs: 3.7 10*3/uL (ref 1.7–7.7)
Neutrophils Relative %: 61 %
PLATELETS: 191 10*3/uL (ref 150–400)
RBC: 4.55 MIL/uL (ref 4.22–5.81)
RDW: 16.2 % — AB (ref 11.5–15.5)
WBC: 6 10*3/uL (ref 4.0–10.5)

## 2015-12-11 LAB — I-STAT TROPONIN, ED: TROPONIN I, POC: 0.01 ng/mL (ref 0.00–0.08)

## 2015-12-11 LAB — LIPASE, BLOOD: LIPASE: 26 U/L (ref 11–51)

## 2015-12-11 MED ORDER — IBUPROFEN 600 MG PO TABS: 600.0000 mg | ORAL_TABLET | Freq: Four times a day (QID) | ORAL | Status: DC | PRN

## 2015-12-11 MED FILL — POTASSIUM CL 10 MEQ TAB SA: 10 | 30 days supply | Qty: 30 | Fill #3

## 2015-12-11 MED FILL — LISINOPRIL 20 MG TABLET: 20 | 30 days supply | Qty: 30 | Fill #3

## 2015-12-11 NOTE — ED Notes (Signed)
Pt refused wheeled chair in waiting room. Pt ambulated to room from waiting room, tolerated well.

## 2015-12-11 NOTE — ED Provider Notes (Signed)
CSN: [id number]     Arrival date & time 12/11/15  1247 History   First MD Initiated Contact with Patient 12/11/15 1744     Chief Complaint  Patient presents with  . Chest Pain     (Consider location/radiation/quality/duration/timing/severity/associated sxs/prior Treatment) HPI   Patient is a 55 year old male past medical history of hypertension, DM and CHF who presents to the ED with complaint of right shoulder and right chest pain. Patient reports he has had right shoulder pain for the past month. He notes 2 days ago his shoulder pain began to radiate into the right side of his chest and right upper back. Patient describes pain as constant dull aching pain and reports having intermittent sharp pain with movement of his right arm. Denies any recent fall, trauma or injury. Patient states he has been taking ibuprofen intermittently at home with relief. He notes he was seen by his PCP last week for his right shoulder pain, was diagnosed with arthritis and advised to take Tylenol. He states he has not been taking the Tylenol. 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 Denies fever, chills, headache, lightheadedness, dizziness, cough, shortness of breath, palpitations, diaphoresis, abdominal pain, nausea, vomiting, swelling, numbness, tingling, weakness, leg swelling. Patient denies smoking. Denies personal and family history of cardiac disease.   Past Medical History  Diagnosis Date  . Hypertrophy of tonsils alone   . Chronic combined systolic and diastolic heart failure ([place])   . Obesity, unspecified   . Obstructive sleep apnea (adult) (pediatric)   . Hypertension   . CHF (congestive heart failure) [facility])    Past Surgical History  Procedure Laterality Date  . Umbilical hernia repair     Family History  Problem Relation Age of Onset  . Diabetes Brother   . Hypertension Mother   . Diabetes Sister   . Cancer Maternal Aunt    Social  History  Substance Use Topics  . Smoking status: Never Smoker   . Smokeless tobacco: Never Used  . Alcohol Use: No    Review of Systems  Cardiovascular: Positive for chest pain.  Musculoskeletal: Positive for arthralgias (right shoulder).  All other systems reviewed and are negative.     Allergies  Review of patient's allergies indicates no known allergies.  Home Medications   Prior to Admission medications   Medication Sig Start Date End Date Taking? Authorizing Provider  aspirin 325 MG tablet Take 325 mg by mouth daily.   Yes Historical Provider, MD  atorvastatin (LIPITOR) 40 MG tablet TAKE 1 TABLET BY MOUTH DAILY. 08/31/15  Yes Josalyn Funches, MD  calcium carbonate (TUMS - DOSED IN MG ELEMENTAL CALCIUM) 500 MG chewable tablet Chew 1 tablet (200 mg of elemental calcium total) by mouth 2 (two) times daily. 11/27/14  Yes Dixie Dials, MD  carvedilol (COREG) 3.125 MG tablet Take 1 tablet (3.125 mg total) by mouth 2 (two) times daily with a meal. 02/10/15  Yes Josalyn Funches, MD  furosemide (LASIX) 40 MG tablet Take 1 tablet (40 mg total) by mouth 2 (two) times daily. 06/27/15  Yes Josalyn Funches, MD  lisinopril (PRINIVIL,ZESTRIL) 40 MG tablet Take 1 tablet (40 mg total) by mouth daily. Patient taking differently: Take 20 mg by mouth daily.  09/28/15  Yes Josalyn Funches, MD  nitroGLYCERIN (NITROSTAT) 0.4 MG SL tablet Place 1 tablet (0.4 mg total) under the tongue every 5 (five) minutes x 3 doses as needed for chest pain.  12/17/12  Yes Dixie Dials, MD  potassium chloride (K-DUR) 10 MEQ tablet Take 10 mEq by mouth daily.   Yes Historical Provider, MD  TRAVATAN Z 0.004 % SOLN ophthalmic solution Place 1 drop into both eyes at bedtime. 11/01/15  Yes Historical Provider, MD  Vitamin D, Cholecalciferol, 400 units CAPS Take 400 Units by mouth daily.   Yes Historical Provider, MD  acetaminophen-codeine (TYLENOL #3) 300-30 MG tablet Take 1 tablet by mouth every 8 (eight) hours as needed. 11/30/15    Josalyn Funches, MD  ibuprofen (ADVIL,MOTRIN) 600 MG tablet Take 1 tablet (600 mg total) by mouth every 6 (six) hours as needed. 12/11/15   Chesley Noon Tiarah Shisler, PA-C   BP 163/98 mmHg  Pulse 58  Temp(Src) 98 F (36.7 C)  Resp 24  Ht 5\' 11"  (1.803 m)  Wt 148.78 kg  BMI 45.77 kg/m2  SpO2 100% Physical Exam  Constitutional: He is oriented to person, place, and time. He appears well-developed and well-nourished. No distress.  Morbidly obese male  HENT:  Head: Normocephalic and atraumatic.  Mouth/Throat: Oropharynx is clear and moist. No oropharyngeal exudate.  Eyes: Conjunctivae and EOM are normal. Pupils are equal, round, and reactive to light. Right eye exhibits no discharge. Left eye exhibits no discharge. No scleral icterus.  Neck: Normal range of motion. Neck supple.  Cardiovascular: Normal rate, regular rhythm, normal heart sounds and intact distal pulses.   Pulmonary/Chest: Effort normal and breath sounds normal. No respiratory distress. He has no wheezes. He has no rales. He exhibits tenderness (right anterior chest wall TTP). He exhibits no mass, no laceration, no crepitus, no edema, no deformity, no swelling and no retraction.  Abdominal: Soft. Bowel sounds are normal. He exhibits no distension and no mass. There is no tenderness. There is no rebound and no guarding.  Musculoskeletal: Normal range of motion. He exhibits tenderness. He exhibits no edema.       Right shoulder: He exhibits tenderness. He exhibits normal range of motion, no swelling, no effusion, no crepitus, no deformity, no laceration, normal pulse and normal strength.  Mild TTP of right upper trapezius and right shoulder. FROM of right shoulder, elbow, wrist and hand. 2+ radial pulse. Sensation grossly intact. Equal grip strength bilaterally. Cap refill <2. No midline C, T, or L tenderness. Full range of motion of neck and back. Full range of motion of bilateral upper and lower extremities, with 5/5 strength. Patient  able to stand and ambulate without assistance.    Neurological: He is alert and oriented to person, place, and time.  Skin: Skin is warm and dry. He is not diaphoretic.  Nursing note and vitals reviewed.   ED Course  Procedures (including critical care time) Labs Review Labs Reviewed  BASIC METABOLIC PANEL - Abnormal; Notable for the following:    Glucose, Bld 103 (*)    BUN 26 (*)    Creatinine, Ser 2.61 (*)    Calcium 8.6 (*)    GFR calc non Af Amer 26 (*)    GFR calc Af Amer 31 (*)    All other components within normal limits  CBC WITH DIFFERENTIAL/PLATELET - Abnormal; Notable for the following:    Hemoglobin 12.9 (*)    RDW 16.2 (*)    All other components within normal limits  LIPASE, BLOOD  I-STAT TROPOININ, ED    Imaging Review Dg Chest 2 View  12/11/2015  CLINICAL DATA:  55 year old male with right-sided chest pain EXAM: CHEST  2 VIEW COMPARISON:  Prior  chest x-ray 11/22/2014 ; prior CT abdomen/pelvis 09/03/2015 FINDINGS: Chronic elevation of the left hemidiaphragm resulting in left to right displacement of the cardiac and mediastinal structures. This is similar in appearance compared to prior imaging. Mild chronic atelectasis in the left lower lobe. Otherwise, the lungs are clear. Chronic bronchitic changes similar compared to prior. No pleural effusion, focal consolidation, or pneumothorax. No acute osseous abnormality. IMPRESSION: Stable chest x-ray without acute cardiopulmonary process. Chronic elevation of the left hemidiaphragm resulting in left-to-right shift of the cardiac and mediastinal structures. Electronically Signed   By: Jacqulynn Cadet M.D.   On: 12/11/2015 13:51   Dg Shoulder Right  12/11/2015  CLINICAL DATA:  Right shoulder pain. No known injury. Initial encounter. EXAM: RIGHT SHOULDER - 2+ VIEW COMPARISON:  None. FINDINGS: There is no evidence of fracture or dislocation. There is no evidence of arthropathy or other focal bone abnormality. Soft tissues are  unremarkable. IMPRESSION: Negative exam. Electronically Signed   By: Inge Rise M.D.   On: 12/11/2015 20:24   I have personally reviewed and evaluated these images and lab results as part of my medical decision-making.   EKG Interpretation   Date/Time:  Monday December 11 2015 13:17:50 EDT Ventricular Rate:  61 PR Interval:  170 QRS Duration: 100 QT Interval:  404 QTC Calculation: 406 R Axis:   73 Text Interpretation:  Sinus rhythm with Premature atrial complexes  Otherwise normal ECG No significant change since last tracing Confirmed by  LIU MD, DANA [id number]) on 12/11/2015 9:23:51 PM      MDM   Final diagnoses:  Right shoulder pain  Chest wall pain    Patient presents with right shoulder that has been present for the past month and right upper chest pain that has been constant for the past 2 days. Denies any known injury or recent fall. History of CHF, hypertension, diabetes. HEART score 2. VSS. Exam revealed tenderness over right anterior chest wall and right shoulder. Remaining exam unremarkable. Bilateral upper extremities neurovascularly intact. EKG showed sinus rhythm, no changes from prior. Troponin negative. Labs appear to be at patient's baseline values. Chest x-ray negative. Right shoulder x-ray negative. I suspect patient's symptoms are likely musculoskeletal in etiology. I have a low suspicion for ACS, PE, dissection, or other acute cardiac event at this time. Discussed results and plan for discharge with patient. Plan to discharge patient home with NSAIDs and symptomatically treatment. Advised patient to follow up with PCP. Discussed return precautions with patient.     Chesley Noon [place], [place] 12/11/15 2150  Forde Dandy, MD 12/12/15 1435

## 2015-12-11 NOTE — Discharge Instructions (Signed)
Take medications as prescribed. You may also take Tylenol as prescribed over-the-counter for pain relief. I recommend applying ice to affected areas for 15-20 minutes 3-4 times daily to help with pain. Refrain from doing any repetitive strenuous movements for the next 2 days until her symptoms have improved. Follow-up with your family doctor in the next week if her symptoms have not improved. Return to emergency department if symptoms worsen or new onset of fever, difficulty breathing, new/worsening chest pain, palpitations, diaphoresis, numbness, tingling, weakness, lightheadedness, dizziness, syncope.

## 2015-12-11 NOTE — ED Notes (Signed)
Pt d/c home. Reviewed d/c paperwork and d/c instructions with pt. Pt ambulated independently. Personal belongings with pt.

## 2015-12-11 NOTE — ED Notes (Signed)
Pt to imaging at this time.

## 2015-12-11 NOTE — ED Notes (Signed)
Pt reports one month of right sided chest pain radiating into right shoulder and right arm for over one month. Denies associated symptoms. Pt reports hx of heart failure.

## 2015-12-13 MED FILL — CARVEDILOL 3.125 MG TABLET: 3.125 | 30 days supply | Qty: 60 | Fill #0

## 2015-12-28 MED FILL — FUROSEMIDE 40 MG TABLET: 40 | 13 days supply | Qty: 26 | Fill #1

## 2016-01-01 ENCOUNTER — Telehealth: Payer: Self-pay | Admitting: Family Medicine

## 2016-01-01 MED FILL — ATORVASTATIN 40 MG TABLET: 40 | 30 days supply | Qty: 30 | Fill #3

## 2016-01-01 NOTE — Telephone Encounter (Signed)
Pt. Called requesting to be referred out to the urologist b/c when her urinates blood come out. Please f/u

## 2016-01-02 NOTE — Telephone Encounter (Signed)
Patient has been referred to nephrologist  Blood is likely from kidneys If he has a lot of blood or pain with peeing he needs to come in for OV

## 2016-01-03 NOTE — Telephone Encounter (Signed)
Patient was called and informed of referral to nephrologist and also informed if blood consist to make a OV appointment

## 2016-01-10 ENCOUNTER — Other Ambulatory Visit: Payer: Self-pay | Admitting: Family Medicine

## 2016-01-10 MED FILL — CARVEDILOL 3.125 MG TABLET: 3.125 | 30 days supply | Qty: 60 | Fill #1

## 2016-01-10 MED FILL — FUROSEMIDE 40 MG TABLET: 40 | 30 days supply | Qty: 60 | Fill #2

## 2016-01-10 MED FILL — POTASSIUM CL 10 MEQ TAB SA: 10 | 30 days supply | Qty: 30 | Fill #4

## 2016-01-15 ENCOUNTER — Other Ambulatory Visit: Payer: Self-pay | Admitting: Family Medicine

## 2016-01-15 MED FILL — LISINOPRIL 20 MG TABLET: 20 | 30 days supply | Qty: 30 | Fill #0

## 2016-01-30 ENCOUNTER — Other Ambulatory Visit: Payer: Self-pay | Admitting: Family Medicine

## 2016-02-01 MED FILL — ATORVASTATIN 40 MG TABLET: 40 | 30 days supply | Qty: 30 | Fill #0

## 2016-02-14 MED FILL — LISINOPRIL 20 MG TABLET: 20 | 30 days supply | Qty: 30 | Fill #1

## 2016-02-14 MED FILL — POTASSIUM CL 10 MEQ TAB SA: 10 | 30 days supply | Qty: 30 | Fill #5

## 2016-02-20 MED FILL — CARVEDILOL 3.125 MG TABLET: 3.125 | 30 days supply | Qty: 60 | Fill #2

## 2016-02-23 ENCOUNTER — Telehealth: Payer: Self-pay | Admitting: Family Medicine

## 2016-02-23 DIAGNOSIS — E669 Obesity, unspecified: Secondary | ICD-10-CM

## 2016-02-23 DIAGNOSIS — E1169 Type 2 diabetes mellitus with other specified complication: Secondary | ICD-10-CM

## 2016-02-23 DIAGNOSIS — I5042 Chronic combined systolic (congestive) and diastolic (congestive) heart failure: Secondary | ICD-10-CM

## 2016-02-23 DIAGNOSIS — I10 Essential (primary) hypertension: Secondary | ICD-10-CM

## 2016-02-23 MED ORDER — FUROSEMIDE 40 MG PO TABS: 40.0000 mg | ORAL_TABLET | Freq: Two times a day (BID) | ORAL | 0 refills | Status: DC

## 2016-02-23 NOTE — Telephone Encounter (Signed)
Patient is needing furosemide.  Patient needs medication sent to the [facility] on [address].  Please follow up.

## 2016-02-23 NOTE — Telephone Encounter (Signed)
Furosemide refilled. 

## 2016-03-07 MED FILL — TRAVATAN Z 0.004% EYE DROP: 0.004 | 30 days supply | Qty: 5 | Fill #3

## 2016-03-11 MED FILL — ATORVASTATIN 40 MG TABLET: 40 | 30 days supply | Qty: 30 | Fill #1

## 2016-03-19 MED FILL — LISINOPRIL 20 MG TABLET: 20 | 30 days supply | Qty: 30 | Fill #2

## 2016-03-19 MED FILL — POTASSIUM CL 10 MEQ TAB SA: 10 | 30 days supply | Qty: 30 | Fill #6

## 2016-04-01 ENCOUNTER — Emergency Department (HOSPITAL_COMMUNITY): Payer: Medicaid Other

## 2016-04-01 ENCOUNTER — Encounter (HOSPITAL_COMMUNITY): Payer: Self-pay | Admitting: *Deleted

## 2016-04-01 ENCOUNTER — Telehealth: Payer: Self-pay | Admitting: Family Medicine

## 2016-04-01 ENCOUNTER — Emergency Department (HOSPITAL_COMMUNITY)
Admission: EM | Admit: 2016-04-01 | Discharge: 2016-04-01 | Disposition: A | Discharge status: Home / Self Care | Payer: Medicaid Other | Attending: Emergency Medicine | Admitting: Emergency Medicine

## 2016-04-01 DIAGNOSIS — E1122 Type 2 diabetes mellitus with diabetic chronic kidney disease: Secondary | ICD-10-CM | POA: Insufficient documentation

## 2016-04-01 DIAGNOSIS — S6992XA Unspecified injury of left wrist, hand and finger(s), initial encounter: Secondary | ICD-10-CM | POA: Diagnosis present

## 2016-04-01 DIAGNOSIS — Z7982 Long term (current) use of aspirin: Secondary | ICD-10-CM | POA: Diagnosis not present

## 2016-04-01 DIAGNOSIS — S63285A Dislocation of proximal interphalangeal joint of left ring finger, initial encounter: Secondary | ICD-10-CM | POA: Insufficient documentation

## 2016-04-01 DIAGNOSIS — N184 Chronic kidney disease, stage 4 (severe): Secondary | ICD-10-CM | POA: Diagnosis not present

## 2016-04-01 DIAGNOSIS — I130 Hypertensive heart and chronic kidney disease with heart failure and stage 1 through stage 4 chronic kidney disease, or unspecified chronic kidney disease: Secondary | ICD-10-CM | POA: Diagnosis not present

## 2016-04-01 DIAGNOSIS — Y999 Unspecified external cause status: Secondary | ICD-10-CM | POA: Diagnosis not present

## 2016-04-01 DIAGNOSIS — Y9389 Activity, other specified: Secondary | ICD-10-CM | POA: Diagnosis not present

## 2016-04-01 DIAGNOSIS — S63259A Unspecified dislocation of unspecified finger, initial encounter: Secondary | ICD-10-CM

## 2016-04-01 DIAGNOSIS — I5042 Chronic combined systolic (congestive) and diastolic (congestive) heart failure: Secondary | ICD-10-CM | POA: Insufficient documentation

## 2016-04-01 DIAGNOSIS — W1830XA Fall on same level, unspecified, initial encounter: Secondary | ICD-10-CM | POA: Diagnosis not present

## 2016-04-01 DIAGNOSIS — Y929 Unspecified place or not applicable: Secondary | ICD-10-CM | POA: Insufficient documentation

## 2016-04-01 MED ORDER — LIDOCAINE HCL (PF) 1 % IJ SOLN
10.0000 mL | Freq: Once | INTRAMUSCULAR | Status: AC
Start: 2016-04-01 — End: 2016-04-01
  Administered 2016-04-01: 10 mL
  Filled 2016-04-01: qty 10

## 2016-04-01 MED ORDER — HYDROCODONE-ACETAMINOPHEN 5-325 MG PO TABS: 1.0000 | ORAL_TABLET | ORAL | 0 refills | Status: DC | PRN

## 2016-04-01 NOTE — ED Provider Notes (Signed)
NERVE BLOCK Performed by: Hanley Hays Consent: Verbal consent obtained. Required items: required blood products, implants, devices, and special equipment available Time out: Immediately prior to procedure a "time out" was called to verify the correct patient, procedure, equipment, support staff and site/side marked as required.  Indication: joint reduction Nerve block body site: Left 4th finger  Preparation: Patient was prepped and draped in the usual sterile fashion. Needle gauge: 24 G Location technique: anatomical landmarks  Local anesthetic: lidocaine 1% w/o epi  Anesthetic total: 5 ml  Outcome: pain improved Patient tolerance: Patient tolerated the procedure well with no immediate complications.   Reduction of dislocation Date/Time: 2:45 AM Performed by: Hanley Hays Authorized by: Hanley Hays Consent: Verbal consent obtained. Risks and benefits: risks, benefits and alternatives were discussed Consent given by: patient Required items: required blood products, implants, devices, and special equipment available Time out: Immediately prior to procedure a "time out" was called to verify the correct patient, procedure, equipment, support staff and site/side marked as required.  Patient sedated: No  Vitals: Vital signs were monitored during sedation. Patient tolerance: Patient tolerated the procedure well with no immediate complications. Joint: Left 4th finger PIP joint Reduction technique: traction counter traction.      Waynetta Pean, PA-C 04/01/16 [place], MD 04/01/16 [phone]

## 2016-04-01 NOTE — ED Triage Notes (Signed)
Pt was messing around and fell onto L ring finger. Finger with deformity and limited movement on assessment

## 2016-04-01 NOTE — Telephone Encounter (Signed)
Pt calling requesting a referral to a hand specialist  Pt was recently in the ED for a dislocated finger  Pt has been scheduled the next available appointment with PCP on 11/16

## 2016-04-01 NOTE — ED Provider Notes (Signed)
[facility] DEPT Provider Note   CSN: [id number] Arrival date & time: 04/01/16  0039     History   Chief Complaint Chief Complaint  Patient presents with  . Finger Injury    HPI Adam Dennis is a 55 y.o. male.  HPI Patient comes to the ER for treatment of a finger injury to his left 4th digit. He reports goofing off and fell on the finger. It is now painful and deformed. No bleeding or skin disruption. The pain is severe. Denies any other injuries.  Past Medical History:  Diagnosis Date  . CHF (congestive heart failure) ([place])   . Chronic combined systolic and diastolic heart failure ([place])   . Hypertension   . Hypertrophy of tonsils alone   . Obesity, unspecified   . Obstructive sleep apnea (adult) (pediatric)     Patient Active Problem List   Diagnosis Date Noted  . Vitamin D insufficiency 12/03/2015  . Glaucoma of right eye 11/30/2015  . CKD (chronic kidney disease) stage 4, GFR 15-29 ml/min (HCC) 10/04/2015  . Left hip pain 10/04/2015  . Right anterior shoulder pain 10/04/2015  . Erectile dysfunction 06/26/2015  . O2 dependent 04/05/2015  . Diabetic nephropathy associated with type 2 diabetes mellitus ([place]) 02/13/2015  . Diabetes mellitus type 2 in obese ([place]) 02/10/2015  . Chronic combined systolic and diastolic CHF (congestive heart failure) ([place]) 02/10/2015  . Depression 02/10/2015  . Tinea pedis 02/10/2015  . Onychomycosis 02/10/2015  . Obesity hypoventilation syndrome ([place]) 02/10/2015  . Essential hypertension, benign 08/16/2013  . Dry skin 02/26/2013  . Hyperlipidemia 11/14/2008  . Morbid obesity ([place]) 03/10/2007  . Obstructive sleep apnea 03/10/2007  . HYPERTROPHY, TONSILS 03/10/2007    Past Surgical History:  Procedure Laterality Date  . UMBILICAL HERNIA REPAIR         Home Medications    Prior to Admission medications   Medication Sig Start Date End Date Taking? Authorizing Provider  acetaminophen-codeine (TYLENOL #3) 300-30 MG  tablet Take 1 tablet by mouth every 8 (eight) hours as needed. 11/30/15   Boykin Nearing, MD  aspirin 325 MG tablet Take 325 mg by mouth daily.    Historical Provider, MD  atorvastatin (LIPITOR) 40 MG tablet TAKE 1 TABLET BY MOUTH DAILY. 01/31/16   Boykin Nearing, MD  calcium carbonate (TUMS - DOSED IN MG ELEMENTAL CALCIUM) 500 MG chewable tablet Chew 1 tablet (200 mg of elemental calcium total) by mouth 2 (two) times daily. 11/27/14   Dixie Dials, MD  carvedilol (COREG) 3.125 MG tablet Take 1 tablet (3.125 mg total) by mouth 2 (two) times daily with a meal. 02/10/15   Josalyn Funches, MD  furosemide (LASIX) 40 MG tablet Take 1 tablet (40 mg total) by mouth 2 (two) times daily. 02/23/16   Boykin Nearing, MD  HYDROcodone-acetaminophen (NORCO/VICODIN) 5-325 MG tablet Take 1-2 tablets by mouth every 4 (four) hours as needed. 04/01/16   Tramayne Sebesta Carlota Raspberry, PA-C  ibuprofen (ADVIL,MOTRIN) 600 MG tablet Take 1 tablet (600 mg total) by mouth every 6 (six) hours as needed. 12/11/15   Nona Dell, PA-C  lisinopril (PRINIVIL,ZESTRIL) 20 MG tablet TAKE 1 TABLET BY MOUTH DAILY. 01/15/16   Josalyn Funches, MD  nitroGLYCERIN (NITROSTAT) 0.4 MG SL tablet Place 1 tablet (0.4 mg total) under the tongue every 5 (five) minutes x 3 doses as needed for chest pain. 12/17/12   Dixie Dials, MD  potassium chloride (K-DUR) 10 MEQ tablet Take 10 mEq by mouth daily.    Historical Provider, MD  TRAVATAN Z 0.004 % SOLN ophthalmic solution Place 1 drop into both eyes at bedtime. 11/01/15   Historical Provider, MD  Vitamin D, Cholecalciferol, 400 units CAPS Take 400 Units by mouth daily.    Historical Provider, MD    Family History Family History  Problem Relation Age of Onset  . Hypertension Mother   . Diabetes Brother   . Diabetes Sister   . Cancer Maternal Aunt     Social History Social History  Substance Use Topics  . Smoking status: Never Smoker  . Smokeless tobacco: Never Used  . Alcohol use No     Allergies     Patient has no known allergies.   Review of Systems Review of Systems Review of Systems All other systems negative except as documented in the HPI. All pertinent positives and negatives as reviewed in the HPI.   Physical Exam Updated Vital Signs BP 123/98   Pulse 72   Temp 98.6 F (37 C) (Oral)   Resp 16   SpO2 98%   Physical Exam  Constitutional: He appears well-developed and well-nourished. No distress.  HENT:  Head: Normocephalic and atraumatic.  Eyes: Pupils are equal, round, and reactive to light.  Neck: Normal range of motion. Neck supple.  Cardiovascular: Normal rate and regular rhythm.   Pulmonary/Chest: Effort normal.  Abdominal: Soft.  Musculoskeletal:  Deformity to middle joint of left 4th finger digit.. Intact sensation to touch. Pain to palpation, decreased ROM.  Neurological: He is alert.  Skin: Skin is warm and dry.  Nursing note and vitals reviewed.    ED Treatments / Results  Labs (all labs ordered are listed, but only abnormal results are displayed) Labs Reviewed - No data to display  EKG  EKG Interpretation None       Radiology Dg Finger Ring Left  Result Date: 04/01/2016 CLINICAL DATA:  Pain after a fall. EXAM: LEFT RING FINGER 2+V COMPARISON:  None. FINDINGS: There is complete posterior hand medial dislocation of the middle phalanx of the left fourth finger with respect to the proximal phalanx. Associated soft tissue swelling. No acute fractures are demonstrated. IMPRESSION: Dislocation of the proximal interphalangeal joint of the left fourth finger. Electronically Signed   By: Lucienne Capers M.D.   On: 04/01/2016 02:03    Procedures Procedures (including critical care time)  Medications Ordered in ED Medications  lidocaine (PF) (XYLOCAINE) 1 % injection 10 mL (10 mLs Other Given by Other 04/01/16 0221)     Initial Impression / Assessment and Plan / ED Course  I have reviewed the triage vital signs and the nursing  notes.  Pertinent labs & imaging results that were available during my care of the patient were reviewed by me and considered in my medical decision making (see chart for details).  Clinical Course     Pt with significant dislocation, relocated in the ED. Confirmed successful relocation on repeat xray. Finger splint placed. Recommend RICE.   Please refer to Will Dansie's note for procedure and digital block documentation.   Final Clinical Impressions(s) / ED Diagnoses   Final diagnoses:  Dislocation of finger, initial encounter    New Prescriptions New Prescriptions   HYDROCODONE-ACETAMINOPHEN (NORCO/VICODIN) 5-325 MG TABLET    Take 1-2 tablets by mouth every 4 (four) hours as needed.     Delos Haring, PA-C 04/01/16 [place], DO 04/01/16 [phone]

## 2016-04-02 NOTE — Telephone Encounter (Signed)
Pt was called on 11/07 and informed of referral being placed.

## 2016-04-02 NOTE — Telephone Encounter (Signed)
Referral to hand surgery placed Please inform patient

## 2016-04-03 MED FILL — FUROSEMIDE 40 MG TABLET: 40 | 17 days supply | Qty: 34 | Fill #3

## 2016-04-11 ENCOUNTER — Encounter: Payer: Self-pay | Admitting: Family Medicine

## 2016-04-11 ENCOUNTER — Other Ambulatory Visit: Payer: Self-pay | Admitting: Pharmacist

## 2016-04-11 ENCOUNTER — Ambulatory Visit: Payer: Medicaid Other | Attending: Family Medicine | Admitting: Family Medicine

## 2016-04-11 VITALS — BP 170/107 | HR 62 | Temp 98.1°F | Ht 71.0 in | Wt 347.0 lb

## 2016-04-11 DIAGNOSIS — B351 Tinea unguium: Secondary | ICD-10-CM | POA: Diagnosis not present

## 2016-04-11 DIAGNOSIS — E669 Obesity, unspecified: Secondary | ICD-10-CM | POA: Insufficient documentation

## 2016-04-11 DIAGNOSIS — W19XXXD Unspecified fall, subsequent encounter: Secondary | ICD-10-CM | POA: Insufficient documentation

## 2016-04-11 DIAGNOSIS — S63255D Unspecified dislocation of left ring finger, subsequent encounter: Secondary | ICD-10-CM | POA: Insufficient documentation

## 2016-04-11 DIAGNOSIS — Z7982 Long term (current) use of aspirin: Secondary | ICD-10-CM | POA: Diagnosis not present

## 2016-04-11 DIAGNOSIS — Z79899 Other long term (current) drug therapy: Secondary | ICD-10-CM | POA: Insufficient documentation

## 2016-04-11 DIAGNOSIS — I10 Essential (primary) hypertension: Secondary | ICD-10-CM | POA: Insufficient documentation

## 2016-04-11 DIAGNOSIS — E1169 Type 2 diabetes mellitus with other specified complication: Secondary | ICD-10-CM

## 2016-04-11 DIAGNOSIS — E119 Type 2 diabetes mellitus without complications: Secondary | ICD-10-CM | POA: Insufficient documentation

## 2016-04-11 LAB — GLUCOSE, POCT (MANUAL RESULT ENTRY): POC Glucose: 77 mg/dl (ref 70–99)

## 2016-04-11 MED ORDER — AMLODIPINE BESYLATE 10 MG PO TABS: 10.0000 mg | ORAL_TABLET | Freq: Every day | ORAL | 3 refills | Status: DC

## 2016-04-11 MED FILL — ATORVASTATIN 40 MG TABLET: 40 | 30 days supply | Qty: 30 | Fill #2

## 2016-04-11 NOTE — Assessment & Plan Note (Signed)
Remains diet controlled.

## 2016-04-11 NOTE — Progress Notes (Signed)
Pt is here today to follow up on HTN and diabetes.  Pt declined flu shot.

## 2016-04-11 NOTE — Patient Instructions (Addendum)
[place] was seen today for diabetes and hypertension.  Diagnoses and all orders for this visit:  Diabetes mellitus type 2 in obese ([place]) -     POCT glucose (manual entry)  Essential hypertension, benign -     Discontinue: amLODipine (NORVASC) 10 MG tablet; Take 1 tablet (10 mg total) by mouth daily. -     amLODipine (NORVASC) 10 MG tablet; Take 1 tablet (10 mg total) by mouth daily.   Work on exercising to reduce weight which will reduce BP   Add norvasc 10 mg daily  to BP regimen If still elevated without fluid overload on exam will add hydralazine 10 mg 3 times daily   F/u in 2 weeks for HTN with clinical pharmacologist   F/u with me in 2 months for diabetes and HTN   Dr. Adrian Blackwater

## 2016-04-11 NOTE — Progress Notes (Signed)
Subjective:  Patient ID: Adam Dennis, male    DOB: 07/08/1960  Age: 55 y.o. MRN: [id number]  CC: Diabetes and Hypertension   HPI Adam Dennis presents for   1. HTN: compliant with regimen. She saw renal on 03/28/16. His coreg was increased to 6.25 mg BID. He denies HA, CP, SOB or swelling.   2. DM2: diet controlled. No subjective low CBGs.  3. Dislocated finger left 4th digit: has a fall and fell on finger. Went to ED on 04/01/16. Wearing a splint and buddy taping 4th finger to 5th finger. Hand surgery referral placed. Has appt next week. Still with some pain and swelling. No discoloration.   Social History  Substance Use Topics  . Smoking status: Never Smoker  . Smokeless tobacco: Never Used  . Alcohol use No    Outpatient Medications Prior to Visit  Medication Sig Dispense Refill  . aspirin 325 MG tablet Take 325 mg by mouth daily.    Marland Kitchen atorvastatin (LIPITOR) 40 MG tablet TAKE 1 TABLET BY MOUTH DAILY. 30 tablet 3  . calcium carbonate (TUMS - DOSED IN MG ELEMENTAL CALCIUM) 500 MG chewable tablet Chew 1 tablet (200 mg of elemental calcium total) by mouth 2 (two) times daily.    . carvedilol (COREG) 3.125 MG tablet Take 1 tablet (3.125 mg total) by mouth 2 (two) times daily with a meal. 60 tablet 5  . furosemide (LASIX) 40 MG tablet Take 1 tablet (40 mg total) by mouth 2 (two) times daily. 60 tablet 0  . ibuprofen (ADVIL,MOTRIN) 600 MG tablet Take 1 tablet (600 mg total) by mouth every 6 (six) hours as needed. 30 tablet 0  . lisinopril (PRINIVIL,ZESTRIL) 20 MG tablet TAKE 1 TABLET BY MOUTH DAILY. 30 tablet 3  . nitroGLYCERIN (NITROSTAT) 0.4 MG SL tablet Place 1 tablet (0.4 mg total) under the tongue every 5 (five) minutes x 3 doses as needed for chest pain. 25 tablet 1  . potassium chloride (K-DUR) 10 MEQ tablet Take 10 mEq by mouth daily.    . TRAVATAN Z 0.004 % SOLN ophthalmic solution Place 1 drop into both eyes at bedtime.  12  . Vitamin D, Cholecalciferol, 400 units CAPS  Take 400 Units by mouth daily.    Marland Kitchen acetaminophen-codeine (TYLENOL #3) 300-30 MG tablet Take 1 tablet by mouth every 8 (eight) hours as needed. (Patient not taking: Reported on 04/11/2016) 60 tablet 2  . HYDROcodone-acetaminophen (NORCO/VICODIN) 5-325 MG tablet Take 1-2 tablets by mouth every 4 (four) hours as needed. (Patient not taking: Reported on 04/11/2016) 10 tablet 0   No facility-administered medications prior to visit.     ROS Review of Systems  Constitutional: Negative for chills, fatigue, fever and unexpected weight change.  Eyes: Positive for visual disturbance (intermittent blurriness in both eyes ). Negative for pain.  Respiratory: Positive for shortness of breath. Negative for cough.   Cardiovascular: Negative for chest pain, palpitations and leg swelling.  Gastrointestinal: Negative for abdominal pain, blood in stool, constipation, diarrhea, nausea and vomiting.  Endocrine: Negative for polydipsia, polyphagia and polyuria.  Musculoskeletal: Positive for arthralgias and myalgias. Negative for back pain, gait problem and neck pain.  Skin: Negative for rash.  Allergic/Immunologic: Negative for immunocompromised state.  Hematological: Negative for adenopathy. Does not bruise/bleed easily.  Psychiatric/Behavioral: Negative for dysphoric mood, sleep disturbance and suicidal ideas. The patient is not nervous/anxious.     Objective:  BP (!) 170/107 (BP Location: Left Arm, Patient Position: Sitting, Cuff Size: Large)  Pulse 62   Temp 98.1 F (36.7 C) (Oral)   Ht 5\' 11"  (1.803 m)   Wt (!) 347 lb (157.4 kg)   SpO2 99%   BMI 48.40 kg/m   BP/Weight 04/11/2016 04/01/2016 01/06/7516  Systolic BP 001 749 449  Diastolic BP 675 80 81  Wt. (Lbs) 347 - 328  BMI 48.4 - 45.77   Physical Exam  Constitutional: He appears well-developed and well-nourished. No distress.  Morbidly obese No supplemental O2 today   HENT:  Head: Normocephalic and atraumatic.  Neck: Normal range of  motion. Neck supple.  Cardiovascular: Normal rate, regular rhythm, normal heart sounds and intact distal pulses.   Pulmonary/Chest: Effort normal and breath sounds normal.  Musculoskeletal: He exhibits no edema.       Hands:      Feet:  Neurological: He is alert.  Skin: Skin is warm and dry. No rash noted. No erythema.  Psychiatric: He has a normal mood and affect.   Lab Results  Component Value Date   HGBA1C 6 11/30/2015   CBG 77  Assessment & Plan:   [place] was seen today for diabetes and hypertension.  Diagnoses and all orders for this visit:  Diabetes mellitus type 2 in obese ([place]) -     POCT glucose (manual entry)  Essential hypertension, benign -     Discontinue: amLODipine (NORVASC) 10 MG tablet; Take 1 tablet (10 mg total) by mouth daily. -     amLODipine (NORVASC) 10 MG tablet; Take 1 tablet (10 mg total) by mouth daily.  Onychomycosis -     Ambulatory referral to Podiatry    No orders of the defined types were placed in this encounter.   Follow-up: No Follow-up on file.   Boykin Nearing MD

## 2016-04-11 NOTE — Assessment & Plan Note (Addendum)
Remains elevated he has CKD Compliant with regimen and followed closely by renal  Plan: Add norvasc 10 mg daily for BP contro F/u BP check in 2 weeks  If still elevated without fluid overload on exam will add hydralazine 10 mg 3 times daily

## 2016-04-16 ENCOUNTER — Encounter (INDEPENDENT_AMBULATORY_CARE_PROVIDER_SITE_OTHER): Payer: Self-pay | Admitting: Orthopaedic Surgery

## 2016-04-16 ENCOUNTER — Ambulatory Visit (INDEPENDENT_AMBULATORY_CARE_PROVIDER_SITE_OTHER): Payer: Medicaid Other | Admitting: Orthopaedic Surgery

## 2016-04-16 ENCOUNTER — Telehealth (INDEPENDENT_AMBULATORY_CARE_PROVIDER_SITE_OTHER): Payer: Self-pay | Admitting: Radiology

## 2016-04-16 DIAGNOSIS — S63275A Dislocation of unspecified interphalangeal joint of left ring finger, initial encounter: Secondary | ICD-10-CM

## 2016-04-16 NOTE — Telephone Encounter (Signed)
Raquel Sarna called and said that this patient called her to ask about a referral to PT.  Said we were supposed to be doing a PT referral, can you look into this and call her to advise? thanks

## 2016-04-16 NOTE — Progress Notes (Signed)
Office Visit Note   Patient: Adam Dennis           Date of Birth: Jul 08, 1960           MRN: [id number] Visit Date: 04/16/2016              Requested by: Boykin Nearing, MD [address] [place], [place] [address] PCP: [name], MD   Assessment & Plan: Visit Diagnoses:  1. Closed dislocation of interphalangeal joint of left ring finger     Plan:  - begin hand therapy for ROM and strengthening - f/u prn  Follow-Up Instructions: Return if symptoms worsen or fail to improve.   Orders:  No orders of the defined types were placed in this encounter.  No orders of the defined types were placed in this encounter.     Procedures: No procedures performed   Clinical Data: No additional findings.   Subjective: Chief Complaint  Patient presents with  . Left Ring Finger - Pain, Dislocation    55 yo male had closed dorsal dislocation of left ring finger on 04/01/16.  Was reduced in the ER and splinted.  Follows up today.  Denies significant pain.  Pain doesn't radiate.  Pain throbs.    Review of Systems  Constitutional: Negative.   HENT: Negative.   Eyes: Negative.   Respiratory: Negative.   Cardiovascular: Negative.   Gastrointestinal: Negative.   Endocrine: Negative.   Genitourinary: Negative.   Musculoskeletal: Negative.   Skin: Negative.   Allergic/Immunologic: Negative.   Neurological: Negative.   Hematological: Negative.   Psychiatric/Behavioral: Negative.      Objective: Vital Signs: There were no vitals taken for this visit.  Physical Exam  Constitutional: He is oriented to person, place, and time. He appears well-developed and well-nourished.  HENT:  Head: Normocephalic and atraumatic.  Eyes: EOM are normal.  Neck: Neck supple.  Cardiovascular: Intact distal pulses.   Pulmonary/Chest: Effort normal.  Abdominal: Soft.  Neurological: He is alert and oriented to person, place, and time.  Skin: Skin is warm.  Psychiatric: He has a  normal mood and affect. His behavior is normal. Judgment and thought content normal.  Nursing note and vitals reviewed.   Ortho Exam Alignment of left ring finger normal.  Collaterals stable.  No swelling.  Central slip and FDS intact. Specialty Comments:  No specialty comments available.  Imaging: xrays show a concentric reduction of the PIP joint of left ring finger   PMFS History: Patient Active Problem List   Diagnosis Date Noted  . Closed dislocation of interphalangeal joint of left ring finger 04/16/2016  . Vitamin D insufficiency 12/03/2015  . Glaucoma of right eye 11/30/2015  . CKD (chronic kidney disease) stage 4, GFR 15-29 ml/min (HCC) 10/04/2015  . Left hip pain 10/04/2015  . Right anterior shoulder pain 10/04/2015  . Erectile dysfunction 06/26/2015  . O2 dependent 04/05/2015  . Diabetic nephropathy associated with type 2 diabetes mellitus ([place]) 02/13/2015  . Diabetes mellitus type 2 in obese ([place]) 02/10/2015  . Chronic combined systolic and diastolic CHF (congestive heart failure) ([place]) 02/10/2015  . Depression 02/10/2015  . Tinea pedis 02/10/2015  . Onychomycosis 02/10/2015  . Obesity hypoventilation syndrome ([place]) 02/10/2015  . Essential hypertension, benign 08/16/2013  . Dry skin 02/26/2013  . Hyperlipidemia 11/14/2008  . Morbid obesity ([place]) 03/10/2007  . Obstructive sleep apnea 03/10/2007  . HYPERTROPHY, TONSILS 03/10/2007   Past Medical History:  Diagnosis Date  . CHF (congestive heart failure) ([place])   .  Chronic combined systolic and diastolic heart failure ([place])   . Hypertension   . Hypertrophy of tonsils alone   . Obesity, unspecified   . Obstructive sleep apnea (adult) (pediatric)     Family History  Problem Relation Age of Onset  . Hypertension Mother   . Diabetes Brother   . Diabetes Sister   . Cancer Maternal Aunt     Past Surgical History:  Procedure Laterality Date  . UMBILICAL HERNIA REPAIR     Social History   Occupational  History  . works as a Garment/textile technologist History Main Topics  . Smoking status: Never Smoker  . Smokeless tobacco: Never Used  . Alcohol use No  . Drug use: No  . Sexual activity: Not on file

## 2016-04-17 ENCOUNTER — Telehealth: Payer: Self-pay | Admitting: Family Medicine

## 2016-04-17 DIAGNOSIS — S63275A Dislocation of unspecified interphalangeal joint of left ring finger, initial encounter: Secondary | ICD-10-CM

## 2016-04-17 NOTE — Telephone Encounter (Signed)
Patient called the office to speak with PCP to ask for a referral to see a hand therapist. Pt went to specialist yesterday and was told that he should see a therapist. Pt needs referral to be with provider that takes Medicaid. Please follow up.   Thank you.

## 2016-04-17 NOTE — Telephone Encounter (Signed)
Hand surgery referral placed.

## 2016-04-17 NOTE — Telephone Encounter (Signed)
Will route to PCP 

## 2016-04-22 MED FILL — LISINOPRIL 20 MG TABLET: 20 | 30 days supply | Qty: 30 | Fill #3

## 2016-04-24 ENCOUNTER — Ambulatory Visit: Payer: Medicaid Other | Admitting: Pharmacist

## 2016-04-24 NOTE — Telephone Encounter (Signed)
Ok [facility] him to [facility] then

## 2016-04-24 NOTE — Telephone Encounter (Signed)
Order to PT referral made, called patient to advise no answer LMOM.

## 2016-04-24 NOTE — Telephone Encounter (Signed)
Called Pt and hand back and PT and hand states they do not take Adult medicaid. where else would you like to send him to ?

## 2016-04-24 NOTE — Addendum Note (Signed)
Addended by: Precious Bard on: 04/24/2016 11:07 AM   Modules accepted: Orders

## 2016-04-25 MED FILL — TRAVATAN Z 0.004% EYE DROP: 0.004 | 30 days supply | Qty: 5 | Fill #4

## 2016-04-29 ENCOUNTER — Ambulatory Visit: Payer: Self-pay | Admitting: Podiatry

## 2016-04-29 ENCOUNTER — Other Ambulatory Visit: Payer: Self-pay | Admitting: Family Medicine

## 2016-04-29 DIAGNOSIS — E669 Obesity, unspecified: Secondary | ICD-10-CM

## 2016-04-29 DIAGNOSIS — E1169 Type 2 diabetes mellitus with other specified complication: Secondary | ICD-10-CM

## 2016-04-29 DIAGNOSIS — I5042 Chronic combined systolic (congestive) and diastolic (congestive) heart failure: Secondary | ICD-10-CM

## 2016-04-29 DIAGNOSIS — I10 Essential (primary) hypertension: Secondary | ICD-10-CM

## 2016-04-30 MED FILL — FUROSEMIDE 40 MG TABLET: 40 | 30 days supply | Qty: 60 | Fill #0

## 2016-05-06 MED FILL — POTASSIUM CL 10 MEQ TAB SA: 10 | 30 days supply | Qty: 30 | Fill #0 | Status: TO

## 2016-05-16 MED FILL — ATORVASTATIN 40 MG TABLET: 40 | 30 days supply | Qty: 30 | Fill #3

## 2016-05-28 ENCOUNTER — Other Ambulatory Visit: Payer: Self-pay | Admitting: Family Medicine

## 2016-06-06 ENCOUNTER — Encounter (INDEPENDENT_AMBULATORY_CARE_PROVIDER_SITE_OTHER): Payer: Self-pay | Admitting: *Deleted

## 2016-06-17 ENCOUNTER — Other Ambulatory Visit: Payer: Self-pay | Admitting: Family Medicine

## 2016-06-17 MED FILL — ATORVASTATIN 40 MG TABLET: 40 | 30 days supply | Qty: 30 | Fill #0 | Status: TO

## 2016-06-17 MED FILL — LISINOPRIL 20 MG TABLET: 20 | 30 days supply | Qty: 30 | Fill #0

## 2016-06-17 MED FILL — FUROSEMIDE 40 MG TABLET: 40 | 30 days supply | Qty: 60 | Fill #1

## 2016-06-20 ENCOUNTER — Ambulatory Visit: Payer: Medicaid Other | Attending: Orthopaedic Surgery | Admitting: *Deleted

## 2016-06-20 ENCOUNTER — Encounter: Payer: Self-pay | Admitting: *Deleted

## 2016-06-20 DIAGNOSIS — S63279A Dislocation of unspecified interphalangeal joint of unspecified finger, initial encounter: Secondary | ICD-10-CM | POA: Insufficient documentation

## 2016-06-20 DIAGNOSIS — M79645 Pain in left finger(s): Secondary | ICD-10-CM | POA: Insufficient documentation

## 2016-06-20 DIAGNOSIS — R278 Other lack of coordination: Secondary | ICD-10-CM | POA: Diagnosis present

## 2016-06-20 NOTE — Patient Instructions (Addendum)
IP Flexion (Active Blocked)  Flexor Tendon Gliding (Active Full Fist)    Straighten all fingers, then make a fist, bending all joints. Repeat __10__ times. Do __3-4_ sessions per day.  Copyright  VHI. All rights reserved.    DIP Flexion (Active Blocked)    Hold __ring____ finger firmly at the middle so that only the tip joint can bend, then repeat by bending the middle knuckle. Hold __3-5__ seconds. Repeat __10__ times. Do __3-4__ sessions per day.  Flexor Tendon Gliding (Active Hook Fist)    With fingers and knuckles straight, bend middle and tip joints. Do not bend large knuckles. Repeat _10___ times. Do __3-4__ sessions per day.  Flexor Tendon Gliding (Active Full Fist)    Straighten all fingers, then make a fist, bending all joints. Repeat __10__ times. Do __3-4__ sessions per day.  Grip Strengthening (Resistive Putty)    Squeeze putty using thumb and all fingers. Repeat __15__ times. Do __1-2__ sessions per day.  Copyright  VHI. All rights reserved.

## 2016-06-20 NOTE — Therapy (Signed)
[facility] [address] [place], [place], [address] Phone: [phone]   Fax:  [phone]  Occupational Therapy Evaluation  Patient Details  Name: Adam Dennis MRN: [id number] Date of Birth: 11-07-1960 Referring Provider: Precious Bard  Encounter Date: 06/20/2016      OT End of Session - 06/20/16 1218    Number of Visits --  Eval only   Authorization Type Eval only secondary to insurance limitations   Authorization - Visit Number --  Eval only - issued HEP   OT Start Time 1016   OT Stop Time 1057   OT Time Calculation (min) 41 min   Activity Tolerance Patient tolerated treatment well;No increased pain   Behavior During Therapy WFL for tasks assessed/performed      Past Medical History:  Diagnosis Date  . CHF (congestive heart failure) ([place])   . Chronic combined systolic and diastolic heart failure ([place])   . Hypertension   . Hypertrophy of tonsils alone   . Obesity, unspecified   . Obstructive sleep apnea (adult) (pediatric)     Past Surgical History:  Procedure Laterality Date  . UMBILICAL HERNIA REPAIR      There were no vitals filed for this visit.      Subjective Assessment - 06/20/16 1023    Subjective  Pt reports that he fell on his L RF dislocation of interphalangeal joint in Nov 6th, 2017. He presents today per his MD for ROM and strengthening.   Pertinent History See EPIC for PMH   Currently in Pain? Yes   Pain Score 3    Pain Location Finger (Comment which one)   Pain Orientation Left   Pain Descriptors / Indicators Sore   Pain Type Chronic pain   Pain Onset More than a month ago   Pain Frequency Constant   Aggravating Factors  Pt cooks and holding a knife nad using it makes it hurt.   Pain Relieving Factors Nothing           [facility] OT Assessment - 06/20/16 0001      Assessment   Diagnosis L RF dislocation of Interphalangeal joint    Referring Provider Precious Bard   Onset Date 04/01/16   Prior Therapy None.. 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 1   Has the patient had a decrease in activity level because of a fear of falling?  No   Is the patient reluctant to leave their home because of a fear of falling?  No     Home  Environment   Family/patient expects to be discharged to: Private residence   Lives With Family     Prior Function   Level of Independence Independent   Vocation Part time employment   Vocation Requirements Pt is a Training and development officer     ADL   Eating/Feeding Independent   Grooming Modified independent   Lower Body Bathing Modified independent   Lobbyist - Water engineer -  Hygiene Independent   ADL  comments Pt is overall Mod I with some difficulty opening containers and cutting with knife at times using left dominant hand. He has overal ldecreased AROM of his L RF making grip/grasp difficult. He has cont soreness in Left RF PIPJ.     Mobility   Mobility Status Independent     Written Expression   Dominant Hand Left     Vision - History   Baseline Vision Wears glasses only for reading     Cognition   Overall Cognitive Status Within Functional Limits for tasks assessed     Observation/Other Assessments   Other Surveys  Select   Quick DASH  45.45     Sensation   Light Touch Appears Intact     Coordination   Gross Motor Movements are Fluid and Coordinated Yes   Fine Motor Movements are Fluid and Coordinated No     ROM / Strength   AROM / PROM / Strength AROM     AROM   AROM Assessment Site Finger     Left Hand AROM   L  Ring  MCP 0-90 75 Degrees   L Ring PIP 0-100 75 Degrees   L Ring DIP 0-70 45 Degrees  7cm from [facility]     Hand Function   Right Hand Grip (lbs) 90   Right Hand Lateral Pinch 22 lbs   Right Hand 3 Point Pinch 18 lbs   Left Hand Grip (lbs) 54   Left Hand Lateral Pinch 24.5 lbs   Left 3 point pinch 20 lbs       IP Flexion (Active Blocked)  Flexor Tendon Gliding (Active Full Fist)    Straighten all fingers, then make a fist, bending all joints. Repeat __10__ times. Do __3-4_ sessions per day.  Copyright  VHI. All rights reserved.    DIP Flexion (Active Blocked)    Hold __ring____ finger firmly at the middle knuckle so that only the tip joint can bend, then repeat by bending only the middle knuckle and hold the back joint. Hold __3-5__ seconds. Repeat __10__ times each. Do __3-4__ sessions per day.  Flexor Tendon Gliding (Active Hook Fist)    With fingers and knuckles straight, bend middle and tip joints. Do not bend large knuckles. Repeat _10___ times. Do __3-4__ sessions per day.  Flexor Tendon Gliding (Active Full Fist)    Straighten all fingers, then make a fist, bending all joints. Repeat __10__ times. Do __3-4__ sessions per day.  Grip Strengthening (Resistive Putty)    Squeeze putty using thumb and all fingers. Repeat __15__ times. Do __1-2__ sessions per day.  Copyright  VHI. All rights reserved.                  OT Education - 06/20/16 1104    Education provided Yes   Education Details HEP for AROM L RF; active blocked flexion to DIP and PIP; tendon gliding; red putty gor gentle grip/strengthening.   Person(s) Educated Patient   Methods Explanation;Demonstration;Handout;Verbal cues   Comprehension Verbalized understanding;Need further instruction          OT Short Term Goals - 06/20/16 1230      OT SHORT TERM GOAL #1   Title Eval only. 1) Pt will continue HEP and gentle strengthening as instructed in clinic today. Pt is limited to  Eval only based on insurance limitations.           OT Long Term Goals - 06/20/16 1221      OT LONG TERM GOAL #  1   Title Eval only               Plan - 06/20/16 1220    Rehab Potential Good   OT Frequency One time visit   OT Duration Other (comment)  Eval only - pt issued HEP   OT Home Exercise Plan Pt wil lcont HEP I'ly - Eval only   Consulted and Agree with Plan of Care Patient      Patient will benefit from skilled therapeutic intervention in order to improve the following deficits and impairments:  Decreased activity tolerance, Decreased strength, Impaired flexibility, Decreased mobility, Pain, Increased edema, Decreased range of motion, Decreased coordination, Impaired UE functional use  Visit Diagnosis: Closed dislocation of interphalangeal joint of left hand, initial encounter - Plan: Ot plan of care cert/re-cert  Other lack of coordination - Plan: Ot plan of care cert/re-cert  Pain in left finger(s) - Plan: Ot plan of care cert/re-cert    Problem List Patient Active Problem List   Diagnosis Date Noted  . Closed dislocation of interphalangeal joint of left ring finger 04/16/2016  . Vitamin D insufficiency 12/03/2015  . Glaucoma of right eye 11/30/2015  . CKD (chronic kidney disease) stage 4, GFR 15-29 ml/min (HCC) 10/04/2015  . Left hip pain 10/04/2015  . Right anterior shoulder pain 10/04/2015  . Erectile dysfunction 06/26/2015  . O2 dependent 04/05/2015  . Diabetic nephropathy associated with type 2 diabetes mellitus ([place]) 02/13/2015  . Diabetes mellitus type 2 in obese ([place]) 02/10/2015  . Chronic combined systolic and diastolic CHF (congestive heart failure) ([place]) 02/10/2015  . Depression 02/10/2015  . Tinea pedis 02/10/2015  . Onychomycosis 02/10/2015  . Obesity hypoventilation syndrome ([place]) 02/10/2015  . Essential hypertension, benign 08/16/2013  . Dry skin 02/26/2013  . Hyperlipidemia 11/14/2008  . Morbid obesity ([place]) 03/10/2007  .  Obstructive sleep apnea 03/10/2007  . HYPERTROPHY, TONSILS 03/10/2007    Marleah Beever Ardath Sax, OTR/L 06/20/2016, 12:31 PM  [facility] [address] [place], [place], [address] Phone: [phone]   Fax:  [phone]  Name: Adam Dennis MRN: [id number] Date of Birth: 06/08/1960

## 2016-06-21 MED FILL — POTASSIUM CL 10 MEQ TAB SA: 10 | 30 days supply | Qty: 30 | Fill #1 | Status: TO

## 2016-07-17 MED FILL — ATORVASTATIN 40 MG TABLET: 40 | 30 days supply | Qty: 30 | Fill #1 | Status: TO

## 2016-07-17 MED FILL — LISINOPRIL 20 MG TABLET: 20 | 30 days supply | Qty: 30 | Fill #1

## 2016-07-17 MED FILL — FUROSEMIDE 40 MG TABLET: 40 | 30 days supply | Qty: 60 | Fill #2

## 2016-07-29 ENCOUNTER — Encounter: Payer: Self-pay | Admitting: Podiatry

## 2016-07-29 ENCOUNTER — Ambulatory Visit (INDEPENDENT_AMBULATORY_CARE_PROVIDER_SITE_OTHER): Payer: Medicaid Other | Admitting: Podiatry

## 2016-07-29 VITALS — BP 149/91 | HR 68 | Resp 16

## 2016-07-29 DIAGNOSIS — M79609 Pain in unspecified limb: Principal | ICD-10-CM

## 2016-07-29 DIAGNOSIS — B351 Tinea unguium: Secondary | ICD-10-CM

## 2016-07-29 DIAGNOSIS — M79676 Pain in unspecified toe(s): Secondary | ICD-10-CM | POA: Diagnosis not present

## 2016-07-29 DIAGNOSIS — L603 Nail dystrophy: Secondary | ICD-10-CM

## 2016-07-29 DIAGNOSIS — E0843 Diabetes mellitus due to underlying condition with diabetic autonomic (poly)neuropathy: Secondary | ICD-10-CM

## 2016-07-29 DIAGNOSIS — L608 Other nail disorders: Secondary | ICD-10-CM

## 2016-07-29 MED FILL — POTASSIUM CL 10 MEQ TAB SA: 10 | 30 days supply | Qty: 30 | Fill #2 | Status: TO

## 2016-07-29 NOTE — Progress Notes (Signed)
   SUBJECTIVE Patient with a history of diabetes mellitus presents to office today complaining of elongated, thickened nails. Pain while ambulating in shoes. Patient is unable to trim their own nails.   OBJECTIVE General Patient is awake, alert, and oriented x 3 and in no acute distress. Derm Skin is dry and supple bilateral. Negative open lesions or macerations. Remaining integument unremarkable. Nails are tender, long, thickened and dystrophic with subungual debris, consistent with onychomycosis, 1-5 bilateral. No signs of infection noted. Vasc  DP and PT pedal pulses palpable bilaterally. Temperature gradient within normal limits.  Neuro Epicritic and protective threshold sensation diminished bilaterally.  Musculoskeletal Exam No symptomatic pedal deformities noted bilateral. Muscular strength within normal limits.  ASSESSMENT 1. Diabetes Mellitus w/ peripheral neuropathy 2. Onychomycosis of nail due to dermatophyte bilateral 3. Pain in foot bilateral  PLAN OF CARE 1. Patient evaluated today. 2. Instructed to maintain good pedal hygiene and foot care. Stressed importance of controlling blood sugar.  3. Mechanical debridement of nails 1-5 bilaterally performed using a nail nipper. Filed with dremel without incident.  4. Return to clinic in 3 mos.     Maytal Mijangos M. Thais Silberstein, DPM [facility]  Dr. Jaryah Aracena M. Sheetal Lyall, DPM    [address]                                        [place], [place] [address]                Office [phone]  Fax [phone]       

## 2016-08-19 MED FILL — LISINOPRIL 20 MG TABLET: 20 | 30 days supply | Qty: 30 | Fill #2

## 2016-08-19 MED FILL — ATORVASTATIN 40 MG TABLET: 40 | 30 days supply | Qty: 30 | Fill #2 | Status: TO

## 2016-08-22 ENCOUNTER — Other Ambulatory Visit: Payer: Self-pay | Admitting: Family Medicine

## 2016-08-22 DIAGNOSIS — E1169 Type 2 diabetes mellitus with other specified complication: Secondary | ICD-10-CM

## 2016-08-22 DIAGNOSIS — E669 Obesity, unspecified: Secondary | ICD-10-CM

## 2016-08-22 DIAGNOSIS — I5042 Chronic combined systolic (congestive) and diastolic (congestive) heart failure: Secondary | ICD-10-CM

## 2016-08-22 DIAGNOSIS — I10 Essential (primary) hypertension: Secondary | ICD-10-CM

## 2016-08-22 MED FILL — FUROSEMIDE 40 MG TABLET: 40 | 30 days supply | Qty: 60 | Fill #0

## 2016-08-30 ENCOUNTER — Other Ambulatory Visit: Payer: Self-pay | Admitting: Family Medicine

## 2016-08-30 MED FILL — POTASSIUM CL 10 MEQ TAB SA: 10 | 30 days supply | Qty: 30 | Fill #3 | Status: TO

## 2016-09-19 ENCOUNTER — Inpatient Hospital Stay (HOSPITAL_COMMUNITY)
Admission: EM | Admit: 2016-09-19 | Discharge: 2016-09-22 | DRG: 291 | Disposition: A | Discharge status: Home / Self Care | Payer: Medicaid Other | Attending: Internal Medicine | Admitting: Internal Medicine

## 2016-09-19 ENCOUNTER — Emergency Department (HOSPITAL_COMMUNITY): Payer: Medicaid Other

## 2016-09-19 ENCOUNTER — Encounter (HOSPITAL_COMMUNITY): Payer: Self-pay | Admitting: *Deleted

## 2016-09-19 DIAGNOSIS — Z9981 Dependence on supplemental oxygen: Secondary | ICD-10-CM

## 2016-09-19 DIAGNOSIS — J9621 Acute and chronic respiratory failure with hypoxia: Secondary | ICD-10-CM | POA: Diagnosis not present

## 2016-09-19 DIAGNOSIS — N184 Chronic kidney disease, stage 4 (severe): Secondary | ICD-10-CM | POA: Diagnosis present

## 2016-09-19 DIAGNOSIS — I509 Heart failure, unspecified: Secondary | ICD-10-CM

## 2016-09-19 DIAGNOSIS — I10 Essential (primary) hypertension: Secondary | ICD-10-CM

## 2016-09-19 DIAGNOSIS — Z79899 Other long term (current) drug therapy: Secondary | ICD-10-CM

## 2016-09-19 DIAGNOSIS — E785 Hyperlipidemia, unspecified: Secondary | ICD-10-CM | POA: Diagnosis present

## 2016-09-19 DIAGNOSIS — H409 Unspecified glaucoma: Secondary | ICD-10-CM | POA: Diagnosis present

## 2016-09-19 DIAGNOSIS — I5042 Chronic combined systolic (congestive) and diastolic (congestive) heart failure: Secondary | ICD-10-CM

## 2016-09-19 DIAGNOSIS — Z7982 Long term (current) use of aspirin: Secondary | ICD-10-CM

## 2016-09-19 DIAGNOSIS — I5041 Acute combined systolic (congestive) and diastolic (congestive) heart failure: Secondary | ICD-10-CM

## 2016-09-19 DIAGNOSIS — J9610 Chronic respiratory failure, unspecified whether with hypoxia or hypercapnia: Secondary | ICD-10-CM | POA: Diagnosis present

## 2016-09-19 DIAGNOSIS — N185 Chronic kidney disease, stage 5: Secondary | ICD-10-CM | POA: Diagnosis present

## 2016-09-19 DIAGNOSIS — E1169 Type 2 diabetes mellitus with other specified complication: Secondary | ICD-10-CM | POA: Diagnosis present

## 2016-09-19 DIAGNOSIS — E1122 Type 2 diabetes mellitus with diabetic chronic kidney disease: Secondary | ICD-10-CM | POA: Diagnosis present

## 2016-09-19 DIAGNOSIS — I132 Hypertensive heart and chronic kidney disease with heart failure and with stage 5 chronic kidney disease, or end stage renal disease: Principal | ICD-10-CM | POA: Diagnosis present

## 2016-09-19 DIAGNOSIS — E119 Type 2 diabetes mellitus without complications: Secondary | ICD-10-CM

## 2016-09-19 DIAGNOSIS — I5043 Acute on chronic combined systolic (congestive) and diastolic (congestive) heart failure: Secondary | ICD-10-CM | POA: Diagnosis present

## 2016-09-19 DIAGNOSIS — G4733 Obstructive sleep apnea (adult) (pediatric): Secondary | ICD-10-CM | POA: Diagnosis present

## 2016-09-19 DIAGNOSIS — I2510 Atherosclerotic heart disease of native coronary artery without angina pectoris: Secondary | ICD-10-CM | POA: Diagnosis present

## 2016-09-19 DIAGNOSIS — I252 Old myocardial infarction: Secondary | ICD-10-CM

## 2016-09-19 DIAGNOSIS — Z6841 Body Mass Index (BMI) 40.0 and over, adult: Secondary | ICD-10-CM

## 2016-09-19 DIAGNOSIS — E669 Obesity, unspecified: Secondary | ICD-10-CM | POA: Diagnosis present

## 2016-09-19 DIAGNOSIS — I1311 Hypertensive heart and chronic kidney disease without heart failure, with stage 5 chronic kidney disease, or end stage renal disease: Secondary | ICD-10-CM | POA: Diagnosis present

## 2016-09-19 LAB — COMPREHENSIVE METABOLIC PANEL
ALBUMIN: 2.9 g/dL — AB (ref 3.5–5.0)
ALK PHOS: 44 U/L (ref 38–126)
ALT: 18 U/L (ref 17–63)
ANION GAP: 7 (ref 5–15)
AST: 20 U/L (ref 15–41)
BILIRUBIN TOTAL: 0.3 mg/dL (ref 0.3–1.2)
BUN: 27 mg/dL — ABNORMAL HIGH (ref 6–20)
CHLORIDE: 103 mmol/L (ref 101–111)
CO2: 31 mmol/L (ref 22–32)
Calcium: 8.2 mg/dL — ABNORMAL LOW (ref 8.9–10.3)
Creatinine, Ser: 2.84 mg/dL — ABNORMAL HIGH (ref 0.61–1.24)
GFR calc Af Amer: 27 mL/min — ABNORMAL LOW (ref >60)
GFR calc non Af Amer: 24 mL/min — ABNORMAL LOW (ref >60)
Glucose, Bld: 104 mg/dL — ABNORMAL HIGH (ref 65–99)
Potassium: 4.7 mmol/L (ref 3.5–5.1)
Sodium: 141 mmol/L (ref 135–145)
Total Protein: 7 g/dL (ref 6.5–8.1)

## 2016-09-19 LAB — GLUCOSE, CAPILLARY
GLUCOSE-CAPILLARY: 95 mg/dL (ref 65–99)
Glucose-Capillary: 105 mg/dL — ABNORMAL HIGH (ref 65–99)

## 2016-09-19 LAB — TSH: TSH: 1.392 u[IU]/mL (ref 0.350–4.500)

## 2016-09-19 LAB — CBC WITH DIFFERENTIAL/PLATELET
BASOS ABS: 0 10*3/uL (ref 0.0–0.1)
Basophils Relative: 0 %
Eosinophils Absolute: 0.1 10*3/uL (ref 0.0–0.7)
Eosinophils Relative: 1 %
HEMATOCRIT: 38.9 % — AB (ref 39.0–52.0)
HEMOGLOBIN: 11.6 g/dL — AB (ref 13.0–17.0)
LYMPHS ABS: 1.1 10*3/uL (ref 0.7–4.0)
LYMPHS PCT: 15 %
MCH: 27 pg (ref 26.0–34.0)
MCHC: 29.8 g/dL — ABNORMAL LOW (ref 30.0–36.0)
MCV: 90.5 fL (ref 78.0–100.0)
Monocytes Absolute: 0.8 10*3/uL (ref 0.1–1.0)
Monocytes Relative: 11 %
NEUTROS PCT: 73 %
Neutro Abs: 5.4 10*3/uL (ref 1.7–7.7)
PLATELETS: 207 10*3/uL (ref 150–400)
RBC: 4.3 MIL/uL (ref 4.22–5.81)
RDW: 17.1 % — ABNORMAL HIGH (ref 11.5–15.5)
WBC: 7.4 10*3/uL (ref 4.0–10.5)

## 2016-09-19 LAB — BRAIN NATRIURETIC PEPTIDE
B Natriuretic Peptide: 228.6 pg/mL — ABNORMAL HIGH (ref 0.0–100.0)
B Natriuretic Peptide: 247.9 pg/mL — ABNORMAL HIGH (ref 0.0–100.0)

## 2016-09-19 LAB — CBC
HCT: 41.1 % (ref 39.0–52.0)
HEMOGLOBIN: 11.9 g/dL — AB (ref 13.0–17.0)
MCH: 26.4 pg (ref 26.0–34.0)
MCHC: 29 g/dL — ABNORMAL LOW (ref 30.0–36.0)
MCV: 91.3 fL (ref 78.0–100.0)
Platelets: 217 10*3/uL (ref 150–400)
RBC: 4.5 MIL/uL (ref 4.22–5.81)
RDW: 17.5 % — ABNORMAL HIGH (ref 11.5–15.5)
WBC: 6.8 10*3/uL (ref 4.0–10.5)

## 2016-09-19 LAB — CREATININE, SERUM
CREATININE: 2.82 mg/dL — AB (ref 0.61–1.24)
GFR calc Af Amer: 28 mL/min — ABNORMAL LOW (ref >60)
GFR, EST NON AFRICAN AMERICAN: 24 mL/min — AB (ref >60)

## 2016-09-19 LAB — TROPONIN I: TROPONIN I: 0.03 ng/mL — AB (ref <0.03)

## 2016-09-19 LAB — CBG MONITORING, ED: Glucose-Capillary: 79 mg/dL (ref 65–99)

## 2016-09-19 MED ORDER — SODIUM CHLORIDE 0.9 % IV SOLN: 250.0000 mL | INTRAVENOUS | Status: DC | PRN

## 2016-09-19 MED ORDER — CARVEDILOL 12.5 MG PO TABS
12.5000 mg | ORAL_TABLET | Freq: Two times a day (BID) | ORAL | Status: DC
  Administered 2016-09-19 – 2016-09-22 (×6): 12.5 mg via ORAL
  Filled 2016-09-19 (×6): qty 1

## 2016-09-19 MED ORDER — POTASSIUM CHLORIDE CRYS ER 10 MEQ PO TBCR
10.0000 meq | EXTENDED_RELEASE_TABLET | Freq: Every day | ORAL | Status: DC
  Administered 2016-09-19 – 2016-09-22 (×4): 10 meq via ORAL
  Filled 2016-09-19 (×4): qty 1

## 2016-09-19 MED ORDER — SODIUM CHLORIDE 0.9% FLUSH
3.0000 mL | Freq: Two times a day (BID) | INTRAVENOUS | Status: DC
  Administered 2016-09-19 – 2016-09-21 (×4): 3 mL via INTRAVENOUS

## 2016-09-19 MED ORDER — INSULIN ASPART 100 UNIT/ML ~~LOC~~ SOLN
0.0000 [IU] | Freq: Three times a day (TID) | SUBCUTANEOUS | Status: DC
  Administered 2016-09-21: 4 [IU] via SUBCUTANEOUS

## 2016-09-19 MED ORDER — FUROSEMIDE 10 MG/ML IJ SOLN
60.0000 mg | Freq: Once | INTRAMUSCULAR | Status: AC
  Administered 2016-09-19: 60 mg via INTRAVENOUS
  Filled 2016-09-19: qty 6

## 2016-09-19 MED ORDER — FUROSEMIDE 10 MG/ML IJ SOLN
60.0000 mg | Freq: Two times a day (BID) | INTRAMUSCULAR | Status: DC
  Administered 2016-09-19 – 2016-09-22 (×6): 60 mg via INTRAVENOUS
  Filled 2016-09-19 (×6): qty 6

## 2016-09-19 MED ORDER — HYDRALAZINE HCL 20 MG/ML IJ SOLN: 10.0000 mg | Freq: Three times a day (TID) | INTRAMUSCULAR | Status: DC | PRN

## 2016-09-19 MED ORDER — ASPIRIN 325 MG PO TABS
325.0000 mg | ORAL_TABLET | Freq: Every day | ORAL | Status: DC
  Administered 2016-09-20 – 2016-09-22 (×3): 325 mg via ORAL
  Filled 2016-09-19 (×3): qty 1

## 2016-09-19 MED ORDER — ATORVASTATIN CALCIUM 40 MG PO TABS
40.0000 mg | ORAL_TABLET | Freq: Every day | ORAL | Status: DC
  Administered 2016-09-20 – 2016-09-22 (×3): 40 mg via ORAL
  Filled 2016-09-19 (×3): qty 1

## 2016-09-19 MED ORDER — ACETAMINOPHEN 325 MG PO TABS
650.0000 mg | ORAL_TABLET | ORAL | Status: DC | PRN
  Administered 2016-09-20 (×3): 650 mg via ORAL
  Filled 2016-09-19 (×3): qty 2

## 2016-09-19 MED ORDER — SODIUM CHLORIDE 0.9% FLUSH: 3.0000 mL | INTRAVENOUS | Status: DC | PRN

## 2016-09-19 MED ORDER — LISINOPRIL 20 MG PO TABS
20.0000 mg | ORAL_TABLET | Freq: Every day | ORAL | Status: DC
  Administered 2016-09-20 – 2016-09-22 (×3): 20 mg via ORAL
  Filled 2016-09-19 (×3): qty 1

## 2016-09-19 MED ORDER — LATANOPROST 0.005 % OP SOLN
1.0000 [drp] | Freq: Every day | OPHTHALMIC | Status: DC
  Administered 2016-09-19 – 2016-09-21 (×3): 1 [drp] via OPHTHALMIC
  Filled 2016-09-19: qty 2.5

## 2016-09-19 MED ORDER — AMLODIPINE BESYLATE 10 MG PO TABS
10.0000 mg | ORAL_TABLET | Freq: Every day | ORAL | Status: DC
  Administered 2016-09-20 – 2016-09-22 (×3): 10 mg via ORAL
  Filled 2016-09-19 (×3): qty 1

## 2016-09-19 MED ORDER — CARVEDILOL 12.5 MG PO TABS: 6.2500 mg | ORAL_TABLET | Freq: Two times a day (BID) | ORAL | Status: DC

## 2016-09-19 MED ORDER — ONDANSETRON HCL 4 MG/2ML IJ SOLN: 4.0000 mg | Freq: Four times a day (QID) | INTRAMUSCULAR | Status: DC | PRN

## 2016-09-19 MED ORDER — NITROGLYCERIN 0.4 MG SL SUBL: 0.4000 mg | SUBLINGUAL_TABLET | SUBLINGUAL | Status: DC | PRN

## 2016-09-19 MED ORDER — HEPARIN SODIUM (PORCINE) 5000 UNIT/ML IJ SOLN
5000.0000 [IU] | Freq: Three times a day (TID) | INTRAMUSCULAR | Status: DC
  Administered 2016-09-19 – 2016-09-22 (×9): 5000 [IU] via SUBCUTANEOUS
  Filled 2016-09-19 (×9): qty 1

## 2016-09-19 NOTE — Progress Notes (Signed)
New pt admission from ED. Pt brought to the floor in stable condition. Vitals taken. Initial Assessment done. All immediate pertinent needs to patient addressed. Patient Guide given to patient. Important safety instructions relating to hospitalization reviewed with patient. Patient verbalized understanding. Will continue to monitor pt.  Clarise Cruz RN

## 2016-09-19 NOTE — ED Notes (Signed)
Pt with 5 beat run of vtach captured on the monitor. Dr. Vanita Panda made aware.

## 2016-09-19 NOTE — ED Notes (Signed)
Attempted report 

## 2016-09-19 NOTE — ED Notes (Signed)
Got that patient into a gown on the monitor waiting on provider

## 2016-09-19 NOTE — H&P (Signed)
[facility] History and Physical  Adam Dennis:[id number] DOB: 10-Feb-1961 DOA: 09/19/2016  Referring physician:  PCP: [name], MD   Chief Complaint: "I had been getting short of breath over the last few days."  HPI: Adam Dennis is a 56 y.o. male  with past medical history of heart failure, hypertension, sleep apnea presented to the emergency room chief complaint shortness of breath. Patient states over the last week he has become gradually short of breath and has experienced dyspnea on exertion. Denies any chest pain. Denies any chest trauma. Has distant history of "small" heart attack. Is not currently followed by cardiology. Patient does take a full dose of aspirin daily. Denies any recent fever, cough, chills, rash, diaphoresis, nausea and vomiting. Patient has had no recent medication changes and takes his medications as prescribed.  ED course: X-ray taken and EDP ordered Lasix to treat fluid overload. Patient had oxygen need in the emergency room. At one point was on 5 L nasal cannula. Wears 2 L typically at night due to history of sleep apnea. Hospitalist consulted for admission.   Review of Systems:  As per HPI otherwise 10 point review of systems negative.    Past Medical History:  Diagnosis Date  . CHF (congestive heart failure) ([place])   . Chronic combined systolic and diastolic heart failure ([place])   . Hypertension   . Hypertrophy of tonsils alone   . Obesity, unspecified   . Obstructive sleep apnea (adult) (pediatric)    Past Surgical History:  Procedure Laterality Date  . UMBILICAL HERNIA REPAIR     Social History:  reports that he has never smoked. He has never used smokeless tobacco. He reports that he does not drink alcohol or use drugs.  No Known Allergies  Family History  Problem Relation Age of Onset  . Hypertension Mother   . Diabetes Brother   . Diabetes Sister   . Cancer Maternal Aunt      Prior to Admission medications     Medication Sig Start Date End Date Taking? Authorizing Provider  amLODipine (NORVASC) 10 MG tablet Take 1 tablet (10 mg total) by mouth daily. 04/11/16  Yes Boykin Nearing, MD  aspirin 325 MG tablet Take 325 mg by mouth daily.   Yes Historical Provider, MD  atorvastatin (LIPITOR) 40 MG tablet TAKE 1 TABLET BY MOUTH DAILY. 06/17/16  Yes Josalyn Funches, MD  calcium carbonate (TUMS - DOSED IN MG ELEMENTAL CALCIUM) 500 MG chewable tablet Chew 1 tablet (200 mg of elemental calcium total) by mouth 2 (two) times daily. 11/27/14  Yes Dixie Dials, MD  carvedilol (COREG) 12.5 MG tablet Take 12.5 mg by mouth 2 (two) times daily. 07/17/16  Yes Historical Provider, MD  furosemide (LASIX) 40 MG tablet TAKE 1 TABLET BY MOUTH TWICE DAILY. 08/22/16  Yes Josalyn Funches, MD  lisinopril (PRINIVIL,ZESTRIL) 20 MG tablet TAKE 1 TABLET BY MOUTH DAILY. 08/30/16  Yes Josalyn Funches, MD  potassium chloride (K-DUR) 10 MEQ tablet Take 10 mEq by mouth daily.   Yes Historical Provider, MD  TRAVATAN Z 0.004 % SOLN ophthalmic solution Place 1 drop into both eyes at bedtime. 11/01/15  Yes Historical Provider, MD  Vitamin D, Cholecalciferol, 400 units CAPS Take 400 Units by mouth daily.   Yes Historical Provider, MD  carvedilol (COREG) 6.25 MG tablet Take 6.25 mg by mouth 2 (two) times daily with a meal.    Historical Provider, MD  nitroGLYCERIN (NITROSTAT) 0.4 MG SL tablet Place 1 tablet (0.4  mg total) under the tongue every 5 (five) minutes x 3 doses as needed for chest pain. 12/17/12   Dixie Dials, MD   Physical Exam: Vitals:   09/19/16 0904 09/19/16 0932 09/19/16 0933  BP: (!) 172/77    Pulse: 66    Resp: (!) 25    Temp: 97.6 F (36.4 C)    TempSrc: Oral    SpO2: (!) 78% (!) 66% 91%  Weight: 136.1 kg (300 lb)    Height: 5\' 11"  (1.803 m)      Wt Readings from Last 3 Encounters:  09/19/16 136.1 kg (300 lb)  04/11/16 (!) 157.4 kg (347 lb)  12/11/15 (!) 148.8 kg (328 lb)    General:  Appears calm and A&Ox3 Eyes:   PERRL, EOMI, normal lids, iris ENT:  grossly normal hearing, lips & tongue Neck:  no LAD, masses or thyromegaly Cardiovascular:  RRR, no m/r/g. No LE edema.  Respiratory:  decr air movement. Normal respiratory effort. Abdomen:  soft, ntnd Skin:  no rash or induration seen on limited exam Musculoskeletal:  grossly normal tone BUE/BLE Psychiatric:  grossly normal mood and affect, speech fluent and appropriate Neurologic:  CN 2-12 grossly intact, moves all extremities in coordinated fashion.          Labs on Admission:  Basic Metabolic Panel:  Recent Labs Lab 09/19/16 0942  NA 141  K 4.7  CL 103  CO2 31  GLUCOSE 104*  BUN 27*  CREATININE 2.84*  CALCIUM 8.2*   Liver Function Tests:  Recent Labs Lab 09/19/16 0942  AST 20  ALT 18  ALKPHOS 44  BILITOT 0.3  PROT 7.0  ALBUMIN 2.9*   No results for input(s): LIPASE, AMYLASE in the last 168 hours. No results for input(s): AMMONIA in the last 168 hours. CBC:  Recent Labs Lab 09/19/16 0942  WBC 7.4  NEUTROABS 5.4  HGB 11.6*  HCT 38.9*  MCV 90.5  PLT 207   Cardiac Enzymes:  Recent Labs Lab 09/19/16 0942  TROPONINI 0.03*    BNP (last 3 results)  Recent Labs  09/19/16 0942  BNP 228.6*    ProBNP (last 3 results) No results for input(s): PROBNP in the last 8760 hours.   Serum creatinine: 2.84 mg/dL (H) 09/19/16 0942 Estimated creatinine clearance: 41.9 mL/min (A)  CBG: No results for input(s): GLUCAP in the last 168 hours.  Radiological Exams on Admission: Dg Chest 2 View  Result Date: 09/19/2016 CLINICAL DATA:  Shortness of breath. EXAM: CHEST  2 VIEW COMPARISON:  12/11/2015 FINDINGS: Lung volumes remain diminished, slightly more so than on the prior study. There is chronic elevation of the left hemidiaphragm with rightward shift of the heart, similar to the prior study. Left basilar opacities have increased. No right lung consolidation is seen. Mild central peribronchial thickening is similar to the  prior study. No sizable pleural effusion or pneumothorax is identified. No acute osseous abnormality is seen. IMPRESSION: Low lung volumes with chronic elevation of the left hemidiaphragm. Mild left basilar opacities have increased and likely represent atelectasis. Electronically Signed   By: Logan Bores M.D.   On: 09/19/2016 10:06    EKG: Independently reviewed. No STEMI.  Assessment/Plan Principal Problem:   Acute on chronic respiratory failure with hypoxia (HCC) Active Problems:   Obstructive sleep apnea   Essential hypertension, benign   Diabetes mellitus type 2 in obese (HCC)   CKD (chronic kidney disease) stage 4, GFR 15-29 ml/min (HCC)   Acute congestive heart failure Cont diuresis I&O  Cycle trop QD asa ECHO in AM Cont pulse ox Cardiac monitoring Cont coreg, lisinopril Cont ASA Checking bnp and tsh Trop is mildly elevated but in light of CKD not significant but will trend and EKG in AM No Ntg for now due to rapidly decr BP, will monitor Well's Criteria for PE - 0.0 points  HLD Cont statin  OSA RT consult  Hypertension When necessary hydralazine 10 mg IV as needed for severe blood pressure Cont norvasc  DM  SSI ACHS  CKD Monitor Cr daily Cr at baseline,  2.7 2.84 on admit  CAD Cont ntg sl prn cp  Glaucoma Cont travatan-->Xaltatan qhs  Code Status: FULL  DVT Prophylaxis:  Family Communication: heparin Disposition Plan: Pending Improvement  Status: obs tele  Elwin Mocha, MD Family Medicine [facility] [email or web address] Password [email or web address]

## 2016-09-19 NOTE — ED Provider Notes (Signed)
[facility] DEPT Provider Note   CSN: [id number] Arrival date & time: 09/19/16  3474     History   Chief Complaint Chief Complaint  Patient presents with  . Dizziness    HPI Adam Dennis is a 56 y.o. male.  HPI  Patient presents with concern of dyspnea. He acknowledges baseline shortness of breath, requires 2 L oxygen, 24/7. However, over the past few days, patient has noticed gradual worsening of his dyspnea, both at rest and with exertion. During this time.  Patient also has some soreness in his left mid axilla.  This is nonradiating, not worse with exertion or inspiration. There is no concurrent fever, no chills, no nausea, no vomiting, no confusion, no syncope or other complaints. Patient states he takes all medication as directed.  Past Medical History:  Diagnosis Date  . CHF (congestive heart failure) ([place])   . Chronic combined systolic and diastolic heart failure ([place])   . Hypertension   . Hypertrophy of tonsils alone   . Obesity, unspecified   . Obstructive sleep apnea (adult) (pediatric)     Patient Active Problem List   Diagnosis Date Noted  . Closed dislocation of interphalangeal joint of left ring finger 04/16/2016  . Vitamin D insufficiency 12/03/2015  . Glaucoma of right eye 11/30/2015  . CKD (chronic kidney disease) stage 4, GFR 15-29 ml/min (HCC) 10/04/2015  . Left hip pain 10/04/2015  . Right anterior shoulder pain 10/04/2015  . Erectile dysfunction 06/26/2015  . O2 dependent 04/05/2015  . Diabetic nephropathy associated with type 2 diabetes mellitus ([place]) 02/13/2015  . Diabetes mellitus type 2 in obese ([place]) 02/10/2015  . Chronic combined systolic and diastolic CHF (congestive heart failure) ([place]) 02/10/2015  . Depression 02/10/2015  . Tinea pedis 02/10/2015  . Onychomycosis 02/10/2015  . Obesity hypoventilation syndrome ([place]) 02/10/2015  . Essential hypertension, benign 08/16/2013  . Dry skin 02/26/2013  . Hyperlipidemia 11/14/2008  .  Morbid obesity ([place]) 03/10/2007  . Obstructive sleep apnea 03/10/2007  . HYPERTROPHY, TONSILS 03/10/2007    Past Surgical History:  Procedure Laterality Date  . UMBILICAL HERNIA REPAIR         Home Medications    Prior to Admission medications   Medication Sig Start Date End Date Taking? Authorizing Provider  amLODipine (NORVASC) 10 MG tablet Take 1 tablet (10 mg total) by mouth daily. 04/11/16   Boykin Nearing, MD  aspirin 325 MG tablet Take 325 mg by mouth daily.    Historical Provider, MD  atorvastatin (LIPITOR) 40 MG tablet TAKE 1 TABLET BY MOUTH DAILY. 06/17/16   Boykin Nearing, MD  calcium carbonate (TUMS - DOSED IN MG ELEMENTAL CALCIUM) 500 MG chewable tablet Chew 1 tablet (200 mg of elemental calcium total) by mouth 2 (two) times daily. 11/27/14   Dixie Dials, MD  carvedilol (COREG) 12.5 MG tablet Take 12.5 mg by mouth 2 (two) times daily. 07/17/16   Historical Provider, MD  carvedilol (COREG) 6.25 MG tablet Take 6.25 mg by mouth 2 (two) times daily with a meal.    Historical Provider, MD  furosemide (LASIX) 40 MG tablet TAKE 1 TABLET BY MOUTH TWICE DAILY. 08/22/16   Josalyn Funches, MD  lisinopril (PRINIVIL,ZESTRIL) 20 MG tablet TAKE 1 TABLET BY MOUTH DAILY. 08/30/16   Josalyn Funches, MD  nitroGLYCERIN (NITROSTAT) 0.4 MG SL tablet Place 1 tablet (0.4 mg total) under the tongue every 5 (five) minutes x 3 doses as needed for chest pain. 12/17/12   Dixie Dials, MD  potassium chloride (K-DUR)  10 MEQ tablet Take 10 mEq by mouth daily.    Historical Provider, MD  TRAVATAN Z 0.004 % SOLN ophthalmic solution Place 1 drop into both eyes at bedtime. 11/01/15   Historical Provider, MD  Vitamin D, Cholecalciferol, 400 units CAPS Take 400 Units by mouth daily.    Historical Provider, MD    Family History Family History  Problem Relation Age of Onset  . Hypertension Mother   . Diabetes Brother   . Diabetes Sister   . Cancer Maternal Aunt     Social History Social History  Substance  Use Topics  . Smoking status: Never Smoker  . Smokeless tobacco: Never Used  . Alcohol use No     Allergies   Patient has no known allergies.   Review of Systems Review of Systems  Constitutional:       Per HPI, otherwise negative  HENT:       Per HPI, otherwise negative  Respiratory:       Per HPI, otherwise negative  Cardiovascular:       Per HPI, otherwise negative  Gastrointestinal: Negative for vomiting.  Endocrine:       Negative aside from HPI  Genitourinary:       Neg aside from HPI   Musculoskeletal:       Per HPI, otherwise negative  Skin: Negative.   Neurological: Negative for syncope.     Physical Exam Updated Vital Signs BP (!) 172/77 (BP Location: Left Arm)   Pulse 66   Temp 97.6 F (36.4 C) (Oral)   Resp (!) 25   Ht 5\' 11"  (1.803 m)   Wt 300 lb (136.1 kg)   SpO2 91%   BMI 41.84 kg/m   Physical Exam  Constitutional: He is oriented to person, place, and time. He appears well-developed. No distress.  Obese, pleasant, smiling male, interacting appropriately  HENT:  Head: Normocephalic and atraumatic.  Eyes: Conjunctivae and EOM are normal.  Cardiovascular: Normal rate and regular rhythm.   Pulmonary/Chest: No stridor. Tachypnea noted. He has decreased breath sounds. He has no wheezes.  Abdominal: He exhibits no distension.  Musculoskeletal: He exhibits no edema.  Neurological: He is alert and oriented to person, place, and time.  Skin: Skin is warm and dry.  Psychiatric: He has a normal mood and affect.  Nursing note and vitals reviewed.  Patient required nasal cannula for supplemental oxygen  ED Treatments / Results  Labs (all labs ordered are listed, but only abnormal results are displayed) Labs Reviewed  COMPREHENSIVE METABOLIC PANEL - Abnormal; Notable for the following:       Result Value   Glucose, Bld 104 (*)    BUN 27 (*)    Creatinine, Ser 2.84 (*)    Calcium 8.2 (*)    Albumin 2.9 (*)    GFR calc non Af Amer 24 (*)    GFR  calc Af Amer 27 (*)    All other components within normal limits  BRAIN NATRIURETIC PEPTIDE - Abnormal; Notable for the following:    B Natriuretic Peptide 228.6 (*)    All other components within normal limits  TROPONIN I - Abnormal; Notable for the following:    Troponin I 0.03 (*)    All other components within normal limits  CBC WITH DIFFERENTIAL/PLATELET - Abnormal; Notable for the following:    Hemoglobin 11.6 (*)    HCT 38.9 (*)    MCHC 29.8 (*)    RDW 17.1 (*)    All other  components within normal limits    EKG  EKG Interpretation  Date/Time:  Thursday September 19 2016 09:16:47 EDT Ventricular Rate:  68 PR Interval:  168 QRS Duration: 100 QT Interval:  404 QTC Calculation: 429 R Axis:   52 Text Interpretation:  Normal sinus rhythm T wave abnormality Artifact Abnormal ekg Confirmed by Carmin Muskrat  MD ([id number]) on 09/19/2016 10:37:31 AM       Radiology Dg Chest 2 View  Result Date: 09/19/2016 CLINICAL DATA:  Shortness of breath. EXAM: CHEST  2 VIEW COMPARISON:  12/11/2015 FINDINGS: Lung volumes remain diminished, slightly more so than on the prior study. There is chronic elevation of the left hemidiaphragm with rightward shift of the heart, similar to the prior study. Left basilar opacities have increased. No right lung consolidation is seen. Mild central peribronchial thickening is similar to the prior study. No sizable pleural effusion or pneumothorax is identified. No acute osseous abnormality is seen. IMPRESSION: Low lung volumes with chronic elevation of the left hemidiaphragm. Mild left basilar opacities have increased and likely represent atelectasis. Electronically Signed   By: Logan Bores M.D.   On: 09/19/2016 10:06    Procedures Procedures (including critical care time)  Medications Ordered in ED Medications  furosemide (LASIX) injection 60 mg (not administered)     Initial Impression / Assessment and Plan / ED Course  I have reviewed the triage vital  signs and the nursing notes.  Pertinent labs & imaging results that were available during my care of the patient were reviewed by me and considered in my medical decision making (see chart for details).   pulse oximetry 65% room air, improved to 90% with 5 L nasal cannula.  On repeat exam patient is aware of all findings, no new concerns. With concern for elevated BNP, minor elevation in troponin, and given the patient's substantial history, he has received IV Lasix, will require admission.  Patient does have some pain in his left axilla, though no anterior chest pain, and given evidence for acute heart failure exacerbation, there is suspicion for metabolic demand contributing to his elevated troponin.  Final Clinical Impressions(s) / ED Diagnoses  Acute heart failure exacerbation Hypoxia Elevated troponin  CRITICAL CARE Performed by: Carmin Muskrat Total critical care time: 35 minutes Critical care time was exclusive of separately billable procedures and treating other patients. Critical care was necessary to treat or prevent imminent or life-threatening deterioration. Critical care was time spent personally by me on the following activities: development of treatment plan with patient and/or surrogate as well as nursing, discussions with consultants, evaluation of patient's response to treatment, examination of patient, obtaining history from patient or surrogate, ordering and performing treatments and interventions, ordering and review of laboratory studies, ordering and review of radiographic studies, pulse oximetry and re-evaluation of patient's condition.     Carmin Muskrat, MD 09/19/16 1145

## 2016-09-19 NOTE — ED Triage Notes (Signed)
States he was getting off the bus today and his vision was blurry. States he is on o2 at home. c/o slight chest pain under left arm.

## 2016-09-20 ENCOUNTER — Observation Stay (HOSPITAL_COMMUNITY): Payer: Medicaid Other

## 2016-09-20 ENCOUNTER — Other Ambulatory Visit: Payer: Self-pay

## 2016-09-20 DIAGNOSIS — H409 Unspecified glaucoma: Secondary | ICD-10-CM | POA: Diagnosis present

## 2016-09-20 DIAGNOSIS — I5043 Acute on chronic combined systolic (congestive) and diastolic (congestive) heart failure: Secondary | ICD-10-CM | POA: Diagnosis present

## 2016-09-20 DIAGNOSIS — Z7982 Long term (current) use of aspirin: Secondary | ICD-10-CM | POA: Diagnosis not present

## 2016-09-20 DIAGNOSIS — E1122 Type 2 diabetes mellitus with diabetic chronic kidney disease: Secondary | ICD-10-CM | POA: Diagnosis present

## 2016-09-20 DIAGNOSIS — I5041 Acute combined systolic (congestive) and diastolic (congestive) heart failure: Secondary | ICD-10-CM | POA: Diagnosis not present

## 2016-09-20 DIAGNOSIS — E1169 Type 2 diabetes mellitus with other specified complication: Secondary | ICD-10-CM

## 2016-09-20 DIAGNOSIS — I5021 Acute systolic (congestive) heart failure: Secondary | ICD-10-CM | POA: Diagnosis not present

## 2016-09-20 DIAGNOSIS — R0602 Shortness of breath: Secondary | ICD-10-CM | POA: Diagnosis not present

## 2016-09-20 DIAGNOSIS — I2510 Atherosclerotic heart disease of native coronary artery without angina pectoris: Secondary | ICD-10-CM | POA: Diagnosis present

## 2016-09-20 DIAGNOSIS — E785 Hyperlipidemia, unspecified: Secondary | ICD-10-CM | POA: Diagnosis present

## 2016-09-20 DIAGNOSIS — Z79899 Other long term (current) drug therapy: Secondary | ICD-10-CM | POA: Diagnosis not present

## 2016-09-20 DIAGNOSIS — G4733 Obstructive sleep apnea (adult) (pediatric): Secondary | ICD-10-CM | POA: Diagnosis present

## 2016-09-20 DIAGNOSIS — E669 Obesity, unspecified: Secondary | ICD-10-CM

## 2016-09-20 DIAGNOSIS — Z9981 Dependence on supplemental oxygen: Secondary | ICD-10-CM | POA: Diagnosis not present

## 2016-09-20 DIAGNOSIS — I132 Hypertensive heart and chronic kidney disease with heart failure and with stage 5 chronic kidney disease, or end stage renal disease: Secondary | ICD-10-CM | POA: Diagnosis present

## 2016-09-20 DIAGNOSIS — Z6841 Body Mass Index (BMI) 40.0 and over, adult: Secondary | ICD-10-CM | POA: Diagnosis not present

## 2016-09-20 DIAGNOSIS — I252 Old myocardial infarction: Secondary | ICD-10-CM | POA: Diagnosis not present

## 2016-09-20 DIAGNOSIS — N184 Chronic kidney disease, stage 4 (severe): Secondary | ICD-10-CM

## 2016-09-20 DIAGNOSIS — J9621 Acute and chronic respiratory failure with hypoxia: Secondary | ICD-10-CM | POA: Diagnosis present

## 2016-09-20 DIAGNOSIS — N185 Chronic kidney disease, stage 5: Secondary | ICD-10-CM | POA: Diagnosis present

## 2016-09-20 LAB — GLUCOSE, CAPILLARY
GLUCOSE-CAPILLARY: 77 mg/dL (ref 65–99)
GLUCOSE-CAPILLARY: 95 mg/dL (ref 65–99)
GLUCOSE-CAPILLARY: 97 mg/dL (ref 65–99)
Glucose-Capillary: 94 mg/dL (ref 65–99)
Glucose-Capillary: 143 mg/dL — ABNORMAL HIGH (ref 65–99)

## 2016-09-20 LAB — BASIC METABOLIC PANEL
ANION GAP: 5 (ref 5–15)
BUN: 26 mg/dL — ABNORMAL HIGH (ref 6–20)
CALCIUM: 7.9 mg/dL — AB (ref 8.9–10.3)
CO2: 37 mmol/L — ABNORMAL HIGH (ref 22–32)
Chloride: 100 mmol/L — ABNORMAL LOW (ref 101–111)
Creatinine, Ser: 2.78 mg/dL — ABNORMAL HIGH (ref 0.61–1.24)
GFR, EST AFRICAN AMERICAN: 28 mL/min — AB (ref >60)
GFR, EST NON AFRICAN AMERICAN: 24 mL/min — AB (ref >60)
GLUCOSE: 112 mg/dL — AB (ref 65–99)
POTASSIUM: 4.5 mmol/L (ref 3.5–5.1)
SODIUM: 142 mmol/L (ref 135–145)

## 2016-09-20 LAB — ECHOCARDIOGRAM COMPLETE
CHL CUP DOP CALC LVOT VTI: 27 cm
E decel time: 296 msec
HEIGHTINCHES: 70 in
LVOT peak grad rest: 6 mmHg
LVOTPV: 118 cm/s
MV Dec: 296
MV Peak grad: 2 mmHg
MV pk E vel: 72.8 m/s
MVPKAVEL: 89.7 m/s
WEIGHTICAEL: 5619.2 [oz_av]

## 2016-09-20 LAB — TROPONIN I: Troponin I: <0.03 ng/mL (ref <0.03)

## 2016-09-20 LAB — HIV ANTIBODY (ROUTINE TESTING W REFLEX): HIV Screen 4th Generation wRfx: NONREACTIVE

## 2016-09-20 MED ORDER — HYDROCODONE-ACETAMINOPHEN 5-325 MG PO TABS
1.0000 | ORAL_TABLET | Freq: Four times a day (QID) | ORAL | Status: AC | PRN
  Administered 2016-09-21 (×2): 2 via ORAL
  Filled 2016-09-20 (×2): qty 2

## 2016-09-20 MED ORDER — PERFLUTREN LIPID MICROSPHERE
1.0000 mL | INTRAVENOUS | Status: AC | PRN
Start: 2016-09-20 — End: 2016-09-20
  Administered 2016-09-20: 2 mL via INTRAVENOUS
  Filled 2016-09-20 (×2): qty 10

## 2016-09-20 MED ORDER — ORAL CARE MOUTH RINSE
15.0000 mL | Freq: Two times a day (BID) | OROMUCOSAL | Status: DC
  Administered 2016-09-21 – 2016-09-22 (×2): 15 mL via OROMUCOSAL

## 2016-09-20 NOTE — Progress Notes (Signed)
PROGRESS NOTE  Adam Dennis:[id number] DOB: 12/25/1960 DOA: 09/19/2016 PCP: [name], MD   LOS: 0 days   Brief Narrative: Adam Dennis is a 56 y.o. male with PMH of chronic syst CHF, HTN, HLD, CAD, OSA, and CKD who presented to the ED with CC of SOB-found to have acute respiratory failure secondary to acute on chronic CHF exacerbation. Admitted for further evaluation and treatment  Subjective: Pt was seen and examined at bedside, appears comfortable, in no acute distress. Does not report SOB or dyspnea currently, is on continues 2L nasal cannula and CPAP machine at night for sleep apnea. Reports that hes on 2L O2 at home and uses CPAP machine at home when he sleeps. Pt continues to have LE edema. Denies CP, dizziness. No other complaints.   Assessment & Plan: Acute on chronic respiratory failure w/hypoxia secondary to acute on chronic systolic heart failure: Improving, -4.2 L since admission, weight down to 351 pounds (359 pounds on admission). Still with excessive volume on board-although improved-continue with intravenous Lasix for another day, and reassess tomorrow. Continue beta blocker and lisinopril (watch renal function). 2-D echo pending as well.  Hypertension: Controlled on amlodipine, coreg and lisinopril  CAD: No chest pain-cardiac enzymes negative-EKG nonacute-continue aspirin, beta blocker and statin.   DM type 2: Diet-controlled-CBGs stable with sliding scale insulin.    Hyperlipidemia: Continue statin.  CKD, stage IV, GFR 15-29 ml/min: Cr at baseline is 2.7, 2.84 on admission, 2.78 today. Continue to monitor kidney function-if worsens-discontinue ACE inhibitor  OSA with chronic hypoxemic respiratory failure: On home O2 and CPAP-both being continued.   Glaucoma: On travatan at home, started on Xaltatan qhs here, continue.  Morbid obesity  DVT prophylaxis: Heparin Code Status: FULL Family Communication: None at bedside Disposition Plan: Home when  clinically improved  Consultants:   None  Procedures:   Echo 4/27: Results pending  Antimicrobials:  None  Objective: Vitals:   09/19/16 1620 09/19/16 1945 09/20/16 0555 09/20/16 0708  BP: 112/65 112/63 95/74 99/67   Pulse: 71 73 77 68  Resp: 20 18 18 18   Temp: 97.8 F (36.6 C) 97.9 F (36.6 C) 98.7 F (37.1 C) 97.9 F (36.6 C)  TempSrc: Oral Oral Oral Oral  SpO2: 98% 96% 98% 92%  Weight:   (!) 159.3 kg (351 lb 3.2 oz)   Height:        Intake/Output Summary (Last 24 hours) at 09/20/16 1011 Last data filed at 09/20/16 1004  Gross per 24 hour  Intake              480 ml  Output             4995 ml  Net            -4515 ml   Filed Weights   09/19/16 0904 09/19/16 1445 09/20/16 0555  Weight: 136.1 kg (300 lb) (!) 162.8 kg (359 lb) (!) 159.3 kg (351 lb 3.2 oz)    Examination: Constitutional: NAD Vitals:   09/19/16 1620 09/19/16 1945 09/20/16 0555 09/20/16 0708  BP: 112/65 112/63 95/74 99/67   Pulse: 71 73 77 68  Resp: 20 18 18 18   Temp: 97.8 F (36.6 C) 97.9 F (36.6 C) 98.7 F (37.1 C) 97.9 F (36.6 C)  TempSrc: Oral Oral Oral Oral  SpO2: 98% 96% 98% 92%  Weight:   (!) 159.3 kg (351 lb 3.2 oz)   Height:       HEENT: Atraumatic and normocephalic Respiratory: clear to auscultation  bilaterally, no wheezing. Normal respiratory effort, no accessory muscle use, on nasal cannula. Cardiovascular:  S1 and S2 heard, no murmurs. Bilateral LE edema present.  Abdomen: no tenderness.  Skin: no obvious lesions, warm and dry Psychiatric: Normal judgment and insight. Alert and oriented x 3. Normal mood.    Data Reviewed: I have personally reviewed following labs and imaging studies  CBC:  Recent Labs Lab 09/19/16 0942 09/19/16 1306  WBC 7.4 6.8  NEUTROABS 5.4  --   HGB 11.6* 11.9*  HCT 38.9* 41.1  MCV 90.5 91.3  PLT 207 027   Basic Metabolic Panel:  Recent Labs Lab 09/19/16 0942 09/19/16 1306 09/20/16 0338  NA 141  --  142  K 4.7  --  4.5  CL 103   --  100*  CO2 31  --  37*  GLUCOSE 104*  --  112*  BUN 27*  --  26*  CREATININE 2.84* 2.82* 2.78*  CALCIUM 8.2*  --  7.9*   GFR: Estimated Creatinine Clearance: 46.2 mL/min (A) (by C-G formula based on SCr of 2.78 mg/dL (H)). Liver Function Tests:  Recent Labs Lab 09/19/16 0942  AST 20  ALT 18  ALKPHOS 44  BILITOT 0.3  PROT 7.0  ALBUMIN 2.9*   No results for input(s): LIPASE, AMYLASE in the last 168 hours. No results for input(s): AMMONIA in the last 168 hours. Coagulation Profile: No results for input(s): INR, PROTIME in the last 168 hours. Cardiac Enzymes:  Recent Labs Lab 09/19/16 0942 09/19/16 1501 09/19/16 2058 09/20/16 0338  TROPONINI 0.03* <0.03 <0.03 <0.03   BNP (last 3 results) No results for input(s): PROBNP in the last 8760 hours. HbA1C: No results for input(s): HGBA1C in the last 72 hours. CBG:  Recent Labs Lab 09/19/16 1313 09/19/16 1604 09/19/16 2023 09/20/16 0726  GLUCAP 79 105* 95 94   Lipid Profile: No results for input(s): CHOL, HDL, LDLCALC, TRIG, CHOLHDL, LDLDIRECT in the last 72 hours. Thyroid Function Tests:  Recent Labs  09/19/16 1306  TSH 1.392   Anemia Panel: No results for input(s): VITAMINB12, FOLATE, FERRITIN, TIBC, IRON, RETICCTPCT in the last 72 hours. Urine analysis:    Component Value Date/Time   COLORURINE YELLOW 09/03/2015 1137   APPEARANCEUR CLOUDY (A) 09/03/2015 1137   LABSPEC 1.013 09/03/2015 1137   PHURINE 7.0 09/03/2015 1137   GLUCOSEU NEGATIVE 09/03/2015 1137   HGBUR MODERATE (A) 09/03/2015 1137   BILIRUBINUR NEGATIVE 09/03/2015 1137   KETONESUR NEGATIVE 09/03/2015 1137   PROTEINUR >300 (A) 09/03/2015 1137   UROBILINOGEN 1.0 10/09/2014 0900   NITRITE NEGATIVE 09/03/2015 1137   LEUKOCYTESUR MODERATE (A) 09/03/2015 1137   Sepsis Labs: Invalid input(s): PROCALCITONIN, LACTICIDVEN  No results found for this or any previous visit (from the past 240 hour(s)).    Radiology Studies: Dg Chest 2  View  Result Date: 09/19/2016 CLINICAL DATA:  Shortness of breath. EXAM: CHEST  2 VIEW COMPARISON:  12/11/2015 FINDINGS: Lung volumes remain diminished, slightly more so than on the prior study. There is chronic elevation of the left hemidiaphragm with rightward shift of the heart, similar to the prior study. Left basilar opacities have increased. No right lung consolidation is seen. Mild central peribronchial thickening is similar to the prior study. No sizable pleural effusion or pneumothorax is identified. No acute osseous abnormality is seen. IMPRESSION: Low lung volumes with chronic elevation of the left hemidiaphragm. Mild left basilar opacities have increased and likely represent atelectasis. Electronically Signed   By: Seymour Bars.D.  On: 09/19/2016 10:06     Scheduled Meds: . amLODipine  10 mg Oral Daily  . aspirin  325 mg Oral Daily  . atorvastatin  40 mg Oral Daily  . carvedilol  12.5 mg Oral BID  . furosemide  60 mg Intravenous BID  . heparin  5,000 Units Subcutaneous Q8H  . insulin aspart  0-20 Units Subcutaneous TID WC  . latanoprost  1 drop Both Eyes QHS  . lisinopril  20 mg Oral Daily  . potassium chloride  10 mEq Oral Daily  . sodium chloride flush  3 mL Intravenous Q12H   Continuous Infusions: . sodium chloride      Anitha Bhuvaneswaran, PA-S   If 7PM-7AM, please contact night-coverage [email or web address] Password [facility] 09/20/2016, 10:11 AM     Attending MD note  Patient was seen, examined,treatment plan was discussed with the  Advance Practice Provider.  I have personally reviewed the clinical findings, lab,EKG, imaging studies and management of this patient in detail.I have also reviewed the orders written for this patient which were under my direction. I agree with the documentation, as recorded by the Advance Practice Provider.   56 year old with known history of chronic systolic heart failure, chronic hypoxemic respiratory failure due to obesity hypoventilation  syndrome-presented with decompensated systolic heart failure.  Exam Vitals: Appears stable Lungs: Few bibasilar rales CVS: S1-S2-regular. Abdomen: Morbidly obese-but soft and nontender Extremity: Lower extremity edema++ Neuro: Nonfocal  Labs: Hemoglobin 11.9 Creatinine 2.78 Potassium 4.5  Chest x-ray 4/26 (personally reviewed): Mild bibasilar opacities  Telemetry (personally reviewed) sinus rhythm  Impression: Decompensated systolic heart failure-improving with diuresis Acute on chronic hypoxemic respiratory failure-improved with Lasix Chronic kidney disease stage IV-stable Hypertension Diet-controlled diabetes OSA/OHS-on CPAP QHS  Plan: Clinically improved with diuresis-continue IV Lasix for 1 more day Echo pending Watch renal function-may need to discontinue ACE inhibitor if creatinine worsens further or develops hyperkalemia Case discussed with primary cardiologist Dr Doylene Canard  Rest as above  Oren Binet [facility]

## 2016-09-20 NOTE — Care Management Note (Signed)
Case Management Note  Patient Details  Name: Adam Dennis MRN: [id number] Date of Birth: 30-Jul-1960  Subjective/Objective:   Admitted with CHF                Action/Plan: PCP: [name], MD; has private insurance with Medicaid with prescription drug coverage; DME- home 02, CPAP machine; CM following for DCP  Expected Discharge Date:  Possibly 09/23/2016             Expected Discharge Plan:  Home/Self Care  Discharge planning Services  CM Consult  Status of Service:  In process, will continue to follow  Sherrilyn Rist [phone] 09/20/2016, 10:09 AM

## 2016-09-20 NOTE — Progress Notes (Signed)
  Echocardiogram 2D Echocardiogram has been performed.  Adam Dennis 09/20/2016, 10:57 AM

## 2016-09-21 DIAGNOSIS — I5021 Acute systolic (congestive) heart failure: Secondary | ICD-10-CM

## 2016-09-21 LAB — BASIC METABOLIC PANEL WITH GFR
Anion gap: 6 (ref 5–15)
BUN: 27 mg/dL — ABNORMAL HIGH (ref 6–20)
CO2: 36 mmol/L — ABNORMAL HIGH (ref 22–32)
Calcium: 7.7 mg/dL — ABNORMAL LOW (ref 8.9–10.3)
Chloride: 96 mmol/L — ABNORMAL LOW (ref 101–111)
Creatinine, Ser: 2.65 mg/dL — ABNORMAL HIGH (ref 0.61–1.24)
GFR calc Af Amer: 30 mL/min — ABNORMAL LOW
GFR calc non Af Amer: 26 mL/min — ABNORMAL LOW
Glucose, Bld: 122 mg/dL — ABNORMAL HIGH (ref 65–99)
Potassium: 4.1 mmol/L (ref 3.5–5.1)
Sodium: 138 mmol/L (ref 135–145)

## 2016-09-21 LAB — GLUCOSE, CAPILLARY
Glucose-Capillary: 102 mg/dL — ABNORMAL HIGH (ref 65–99)
Glucose-Capillary: 153 mg/dL — ABNORMAL HIGH (ref 65–99)
Glucose-Capillary: 80 mg/dL (ref 65–99)
Glucose-Capillary: 99 mg/dL (ref 65–99)

## 2016-09-21 NOTE — Plan of Care (Signed)
Problem: Food- and Nutrition-Related Knowledge Deficit (NB-1.1) Goal: Nutrition education Formal process to instruct or train a patient/client in a skill or to impart knowledge to help patients/clients voluntarily manage or modify food choices and eating behavior to maintain or improve health. Outcome: Completed/Met Date Met: 09/21/16 Nutrition Education Note  RD consulted for nutrition education regarding acute on chronic CHF.  Patient reports he typically eats three meals per day with snacks between meals. Breakfast is usually eggs, grits (adds salt to grits), sausage or liver pudding, coffee. Lunch is usually a [place] sandwich with chips. Dinner is usually chicken, fish, or tacos with sides. Snacks are apples, grapes, and oranges. Reports appetite is good. Currently eating 100% of meals. Denies any unintentional weight loss. Only weight changes with fluid.  RD provided "Heart Failure Nutrition Therapy" handout from the Academy of [employer]. Reviewed patient's dietary recall. Provided examples on ways to decrease sodium intake in diet. Discouraged intake of processed foods and use of salt shaker. Encouraged fresh fruits and vegetables as well as whole grain sources of carbohydrates to maximize fiber intake.   RD discussed why it is important for patient to adhere to diet recommendations, and emphasized the role of fluids, foods to avoid, and importance of weighing self daily. Patient's fluid restriction is 1.5 L/day. Teach back method used.  Expect fair compliance.  Body mass index is 50.39 kg/m. Pt meets criteria for Obesity Class III based on current BMI.  Current diet order is Heart Healthy/Carbohydrate Modified diet with 1.5 L fluid restriction, patient is consuming approximately 65-100% of meals at this time. Labs and medications reviewed. No further nutrition interventions warranted at this time. RD contact information provided. If additional nutrition issues arise, please  re-consult RD.   Willey Blade, MS, RD, LDN Pager: [phone] After Hours Pager: [phone]

## 2016-09-21 NOTE — Progress Notes (Signed)
Pt is alert times 4, vitals stable,  NSR on monitor, no any other specific complaints at this point, will continue to monitor the patient.

## 2016-09-21 NOTE — Progress Notes (Signed)
Patient complained of headache, no relief reported after Tylenol was given. Paged MD Opyd on call for addition pain medication. Hydrocodone-Acetaminophen every 6 hrs ordered. Will continue to monitor.   Jiles Goya, RN

## 2016-09-21 NOTE — Progress Notes (Signed)
PROGRESS NOTE  Adam Dennis DUK:[id number] DOB: [birth year] DOA: 09/19/2016 PCP: [name], MD   LOS: 1 day   Brief Narrative: Adam Dennis is a 56 y.o. male with PMH of chronic syst CHF, HTN, HLD, CAD, OSA, and CKD who presented to the ED with CC of SOB-found to have acute respiratory failure secondary to acute on chronic CHF exacerbation. Admitted for further evaluation and treatment.  Subjective:  Patient sitting up on the side of the bed, denies any headache, no chest abdominal pain, currently no shortness of breath at rest, he is on home oxygen which he continues to use. No focal weakness.   Assessment & Plan:  Acute on chronic respiratory failure w/hypoxia secondary to acute on chronic systolic heart failure EF 45% : Echo noted EF is still 45% consistent with echocardiogram a few years ago, continues to have some hypokinesis as also noted previously, chest pain and symptom free, continue Lasix, Coreg along with ACE inhibitor. Continue salt and fluid restriction. So far 7.5 L negative, weight is 159 pounds. Increase activity likely discharge in the morning.  Hypertension: Controlled on amlodipine, coreg and lisinopril  CAD: No chest pain-cardiac enzymes negative-EKG nonacute-continue aspirin, beta blocker and statin.   DM type 2: Diet-controlled-CBGs stable with sliding scale insulin.    Hyperlipidemia: Continue statin.  CKD, stage V, GFR 15-29 ml/min: Cr at baseline is 2.7, currently at or better than baseline.  OSA with chronic hypoxemic respiratory failure: On daytime home oxygen along with CPAP every night, continue.   Glaucoma: On travatan at home, started on Xaltatan qhs here, continue.  Morbid obesity - follow with PCP for weight loss.    DVT prophylaxis: Heparin Code Status: FULL Family Communication: None at bedside Disposition Plan: Home in am  Consultants:   None  Procedures:    Echo 4/27: - Left ventricle: The cavity size was normal.  Systolic function was mildly reduced. The estimated ejection fraction was in the range of 45% to 50%. There is moderate hypokinesis of the inferior myocardium. Doppler parameters are consistent with abnormal left ventricular relaxation (grade 1 diastolic dysfunction).  Antimicrobials:  None  Objective: Vitals:   09/20/16 2203 09/20/16 2312 09/21/16 0530 09/21/16 1159  BP: (!) 153/94  (!) 147/95 (!) 117/57  Pulse: 100 66 (!) 57 (!) 58  Resp:  18 18 20   Temp:   97.4 F (36.3 C) 98 F (36.7 C)  TempSrc:   Oral Oral  SpO2:  95% 97% 98%  Weight:   (!) 159.3 kg (351 lb 3.2 oz)   Height:        Intake/Output Summary (Last 24 hours) at 09/21/16 1313 Last data filed at 09/21/16 1239  Gross per 24 hour  Intake              660 ml  Output             3875 ml  Net            -3215 ml   Filed Weights   09/19/16 1445 09/20/16 0555 09/21/16 0530  Weight: (!) 162.8 kg (359 lb) (!) 159.3 kg (351 lb 3.2 oz) (!) 159.3 kg (351 lb 3.2 oz)    Examination: Constitutional: NAD Vitals:   09/20/16 2203 09/20/16 2312 09/21/16 0530 09/21/16 1159  BP: (!) 153/94  (!) 147/95 (!) 117/57  Pulse: 100 66 (!) 57 (!) 58  Resp:  18 18 20   Temp:   97.4 F (36.3 C) 98 F (36.7 C)  TempSrc:   Oral Oral  SpO2:  95% 97% 98%  Weight:   (!) 159.3 kg (351 lb 3.2 oz)   Height:        Awake Alert, Oriented X 3, No new F.N deficits, Normal affect [place].AT,PERRAL Supple Neck,No JVD, No cervical lymphadenopathy appriciated.  Symmetrical Chest wall movement, Good air movement bilaterally, CTAB RRR,No Gallops,Rubs or new Murmurs, No Parasternal Heave +ve B.Sounds, Abd Soft, No tenderness, No organomegaly appriciated, No rebound - guarding or rigidity. No Cyanosis, Clubbing , trace edema, No new Rash or bruise     Data Reviewed: I have personally reviewed following labs and imaging studies  CBC:  Recent Labs Lab 09/19/16 0942 09/19/16 1306  WBC 7.4 6.8  NEUTROABS 5.4  --   HGB 11.6* 11.9*  HCT 38.9*  41.1  MCV 90.5 91.3  PLT 207 578   Basic Metabolic Panel:  Recent Labs Lab 09/19/16 0942 09/19/16 1306 09/20/16 0338 09/21/16 0252  NA 141  --  142 138  K 4.7  --  4.5 4.1  CL 103  --  100* 96*  CO2 31  --  37* 36*  GLUCOSE 104*  --  112* 122*  BUN 27*  --  26* 27*  CREATININE 2.84* 2.82* 2.78* 2.65*  CALCIUM 8.2*  --  7.9* 7.7*   GFR: Estimated Creatinine Clearance: 48.5 mL/min (A) (by C-G formula based on SCr of 2.65 mg/dL (H)). Liver Function Tests:  Recent Labs Lab 09/19/16 0942  AST 20  ALT 18  ALKPHOS 44  BILITOT 0.3  PROT 7.0  ALBUMIN 2.9*   No results for input(s): LIPASE, AMYLASE in the last 168 hours. No results for input(s): AMMONIA in the last 168 hours. Coagulation Profile: No results for input(s): INR, PROTIME in the last 168 hours. Cardiac Enzymes:  Recent Labs Lab 09/19/16 0942 09/19/16 1501 09/19/16 2058 09/20/16 0338  TROPONINI 0.03* <0.03 <0.03 <0.03   BNP (last 3 results) No results for input(s): PROBNP in the last 8760 hours. HbA1C: No results for input(s): HGBA1C in the last 72 hours. CBG:  Recent Labs Lab 09/20/16 1142 09/20/16 1659 09/20/16 2145 09/21/16 0737 09/21/16 1136  GLUCAP 95 97 143* 102* 153*   Lipid Profile: No results for input(s): CHOL, HDL, LDLCALC, TRIG, CHOLHDL, LDLDIRECT in the last 72 hours. Thyroid Function Tests:  Recent Labs  09/19/16 1306  TSH 1.392   Anemia Panel: No results for input(s): VITAMINB12, FOLATE, FERRITIN, TIBC, IRON, RETICCTPCT in the last 72 hours. Urine analysis:    Component Value Date/Time   COLORURINE YELLOW 09/03/2015 1137   APPEARANCEUR CLOUDY (A) 09/03/2015 1137   LABSPEC 1.013 09/03/2015 1137   PHURINE 7.0 09/03/2015 1137   GLUCOSEU NEGATIVE 09/03/2015 1137   HGBUR MODERATE (A) 09/03/2015 1137   BILIRUBINUR NEGATIVE 09/03/2015 1137   KETONESUR NEGATIVE 09/03/2015 1137   PROTEINUR >300 (A) 09/03/2015 1137   UROBILINOGEN 1.0 10/09/2014 0900   NITRITE NEGATIVE  09/03/2015 1137   LEUKOCYTESUR MODERATE (A) 09/03/2015 1137   Sepsis Labs: Invalid input(s): PROCALCITONIN, LACTICIDVEN  No results found for this or any previous visit (from the past 240 hour(s)).    Radiology Studies: No results found.   Scheduled Meds: . amLODipine  10 mg Oral Daily  . aspirin  325 mg Oral Daily  . atorvastatin  40 mg Oral Daily  . carvedilol  12.5 mg Oral BID  . furosemide  60 mg Intravenous BID  . heparin  5,000 Units Subcutaneous Q8H  . insulin aspart  0-20  Units Subcutaneous TID WC  . latanoprost  1 drop Both Eyes QHS  . lisinopril  20 mg Oral Daily  . mouth rinse  15 mL Mouth Rinse BID  . potassium chloride  10 mEq Oral Daily   Continuous Infusions:  Signature  Lala Lund M.D on 09/21/2016 at 1:14 PM  Between 7am to 7pm - Pager - [phone] ( page via [email or web address], text pages only, please mention full 10 digit call back number).  After 7pm go to [email or web address] - password [email or web address]

## 2016-09-21 NOTE — Progress Notes (Signed)
Patient slept during the night. Complained that his head is hurting. PRN medication given and per patient effective. Otherwise, no other issues or complaints. Will continue to monitor.  Albena Comes, RN

## 2016-09-21 NOTE — Progress Notes (Signed)
Placed patient on CPAP via nasal mask, previous settings of 6.0 cm H20 with 2L O2 bleed in.

## 2016-09-22 LAB — BASIC METABOLIC PANEL
Anion gap: 9 (ref 5–15)
BUN: 29 mg/dL — AB (ref 6–20)
CHLORIDE: 95 mmol/L — AB (ref 101–111)
CO2: 37 mmol/L — AB (ref 22–32)
CREATININE: 2.74 mg/dL — AB (ref 0.61–1.24)
Calcium: 8.2 mg/dL — ABNORMAL LOW (ref 8.9–10.3)
GFR calc Af Amer: 29 mL/min — ABNORMAL LOW (ref >60)
GFR calc non Af Amer: 25 mL/min — ABNORMAL LOW (ref >60)
Glucose, Bld: 102 mg/dL — ABNORMAL HIGH (ref 65–99)
Potassium: 4.4 mmol/L (ref 3.5–5.1)
SODIUM: 141 mmol/L (ref 135–145)

## 2016-09-22 LAB — GLUCOSE, CAPILLARY
GLUCOSE-CAPILLARY: 99 mg/dL (ref 65–99)
GLUCOSE-CAPILLARY: 122 mg/dL — AB (ref 65–99)
GLUCOSE-CAPILLARY: 68 mg/dL (ref 65–99)

## 2016-09-22 MED ORDER — FUROSEMIDE 40 MG PO TABS: 80.0000 mg | ORAL_TABLET | Freq: Two times a day (BID) | ORAL | 0 refills | Status: DC

## 2016-09-22 MED ORDER — ISOSORBIDE MONONITRATE ER 30 MG PO TB24: 30.0000 mg | ORAL_TABLET | Freq: Every day | ORAL | 0 refills | Status: DC

## 2016-09-22 MED ORDER — HYDRALAZINE HCL 50 MG PO TABS: 25.0000 mg | ORAL_TABLET | Freq: Three times a day (TID) | ORAL | 0 refills | Status: DC

## 2016-09-22 NOTE — Discharge Instructions (Signed)
Follow with Primary MD [name], MD in 2-3 days   Get CBC, CMP, 2 view Chest X ray checked  by Primary MD or SNF MD in 2-3 days ( we routinely change or add medications that can affect your baseline labs and fluid status, therefore we recommend that you get the mentioned basic workup next visit with your PCP, your PCP may decide not to get them or add new tests based on their clinical decision)  Activity: As tolerated with Full fall precautions use walker/cane & assistance as needed  Disposition Home    Diet:   Diet heart healthy/carb modified Check your Weight same time everyday, if you gain over 2 pounds, or you develop in leg swelling, experience more shortness of breath or chest pain, call your Primary MD immediately. Follow Cardiac Low Salt Diet and 1.5 lit/day fluid restriction.  On your next visit with your primary care physician please Get Medicines reviewed and adjusted.  Please request your Prim.MD to go over all Hospital Tests and Procedure/Radiological results at the follow up, please get all Hospital records sent to your Prim MD by signing hospital release before you go home.  If you experience worsening of your admission symptoms, develop shortness of breath, life threatening emergency, suicidal or homicidal thoughts you must seek medical attention immediately by calling 911 or calling your MD immediately  if symptoms less severe.  You Must read complete instructions/literature along with all the possible adverse reactions/side effects for all the Medicines you take and that have been prescribed to you. Take any new Medicines after you have completely understood and accpet all the possible adverse reactions/side effects.   Do not drive, operate heavy machinery, perform activities at heights, swimming or participation in water activities or provide baby sitting services if your were admitted for syncope or siezures until you have seen by Primary MD or a Neurologist and  advised to do so again.  Do not drive when taking Pain medications.    Do not take more than prescribed Pain, Sleep and Anxiety Medications  Special Instructions: If you have smoked or chewed Tobacco  in the last 2 yrs please stop smoking, stop any regular Alcohol  and or any Recreational drug use.  Wear Seat belts while driving.   Please note  You were cared for by a hospitalist during your hospital stay. If you have any questions about your discharge medications or the care you received while you were in the hospital after you are discharged, you can call the unit and asked to speak with the hospitalist on call if the hospitalist that took care of you is not available. Once you are discharged, your primary care physician will handle any further medical issues. Please note that NO REFILLS for any discharge medications will be authorized once you are discharged, as it is imperative that you return to your primary care physician (or establish a relationship with a primary care physician if you do not have one) for your aftercare needs so that they can reassess your need for medications and monitor your lab values.

## 2016-09-22 NOTE — Discharge Summary (Signed)
Adam Dennis:[id number] DOB: 09-11-1960 DOA: 09/19/2016  PCP: [name], MD  Admit date: 09/19/2016  Discharge date: 09/22/2016  Admitted From: Home   Disposition:  home   Recommendations for Outpatient Follow-up:   Follow up with PCP in 1-2 weeks  PCP Please obtain BMP/CBC, 2 view CXR in 1week,  (see Discharge instructions)   PCP Please follow up on the following pending results: Monitor weight, BMP closely. Please make sure he follows with his cardiologist within a week   Home Health:  None  Equipment/Devices: None  Consultations: None Discharge Condition: Fair   CODE STATUS: Full   Diet Recommendation: Diet heart healthy/carb modified Fluid restriction: 1500 mL     Chief Complaint  Patient presents with  . Shortness of Breath     Brief history of present illness from the day of admission and additional interim summary     Adam Dennis a 56 y.o. malewith PMH of chronic syst CHF, HTN, HLD, CAD, OSA, and CKD who presented to the ED with CC of SOB-found to have acute respiratory failure secondary to acute on chronic CHF exacerbation. Admitted for further evaluation and treatment.                                                                 Hospital Course    Acute on chronic respiratory failure w/hypoxia secondary to acute on chronic systolic heart failure EF 45% : Echo noted EF is still 45% consistent with echocardiogram a few years ago, continues to have some hypokinesis as also noted previously, chest pain and symptom free, Will be placed on oral Lasix 80 mg twice a day along with Coreg, ACE inhibitor, hydralazine and Imdur, stop Norvasc, educated on salt and fluid restriction. So far 9 L negative, weight is 158 pounds. Continue home oxygen, previous M.D. had discussed his case with  his primary carotids Dr. Doylene Canard who will follow the patient in the office.Request PCP to monitor weight, BMP and diuretic dose closely   Hypertension: Controlled on Coreg, ACE inhibitor, diuretic, added low-dose hydralazine and Imdur discontinued Norvasc. PCP to monitor blood pressure.  CAD: No chest pain-cardiac enzymes negative-EKG nonacute-continue aspirin, beta blocker and statin.   DM type 2: Diet-controlled-PCP to monitor CBGs and A1c.   Hyperlipidemia: Continue statin.  CKD, stage V, GFR 15-29 ml/min: Cr at baseline is 2.7, currently at or better than baseline.  OSA with chronic hypoxemic respiratory failure: On daytime home oxygen along with CPAP every night, continue.   Glaucoma: On travatan at home, started on Xaltatan qhs here, continue.  Morbid obesity - follow with PCP for weight loss.   Discharge diagnosis     Principal Problem:   Acute on chronic respiratory failure with hypoxia (HCC) Active Problems:   Obstructive sleep apnea  Essential hypertension, benign   Diabetes mellitus type 2 in obese (HCC)   CKD (chronic kidney disease) stage 4, GFR 15-29 ml/min (HCC)   Acute CHF (congestive heart failure) [facility])    Discharge instructions    Discharge Instructions    Discharge instructions    Complete by:  As directed    Follow with Primary MD [name], MD in 2-3 days   Get CBC, CMP, 2 view Chest X ray checked  by Primary MD or SNF MD in 2-3 days ( we routinely change or add medications that can affect your baseline labs and fluid status, therefore we recommend that you get the mentioned basic workup next visit with your PCP, your PCP may decide not to get them or add new tests based on their clinical decision)  Activity: As tolerated with Full fall precautions use walker/cane & assistance as needed  Disposition Home    Diet:   Diet heart healthy/carb modified Check your Weight same time everyday, if you gain over 2 pounds, or you develop in  leg swelling, experience more shortness of breath or chest pain, call your Primary MD immediately. Follow Cardiac Low Salt Diet and 1.5 lit/day fluid restriction.  On your next visit with your primary care physician please Get Medicines reviewed and adjusted.  Please request your Prim.MD to go over all Hospital Tests and Procedure/Radiological results at the follow up, please get all Hospital records sent to your Prim MD by signing hospital release before you go home.  If you experience worsening of your admission symptoms, develop shortness of breath, life threatening emergency, suicidal or homicidal thoughts you must seek medical attention immediately by calling 911 or calling your MD immediately  if symptoms less severe.  You Must read complete instructions/literature along with all the possible adverse reactions/side effects for all the Medicines you take and that have been prescribed to you. Take any new Medicines after you have completely understood and accpet all the possible adverse reactions/side effects.   Do not drive, operate heavy machinery, perform activities at heights, swimming or participation in water activities or provide baby sitting services if your were admitted for syncope or siezures until you have seen by Primary MD or a Neurologist and advised to do so again.  Do not drive when taking Pain medications.    Do not take more than prescribed Pain, Sleep and Anxiety Medications  Special Instructions: If you have smoked or chewed Tobacco  in the last 2 yrs please stop smoking, stop any regular Alcohol  and or any Recreational drug use.  Wear Seat belts while driving.   Please note  You were cared for by a hospitalist during your hospital stay. If you have any questions about your discharge medications or the care you received while you were in the hospital after you are discharged, you can call the unit and asked to speak with the hospitalist on call if the hospitalist  that took care of you is not available. Once you are discharged, your primary care physician will handle any further medical issues. Please note that NO REFILLS for any discharge medications will be authorized once you are discharged, as it is imperative that you return to your primary care physician (or establish a relationship with a primary care physician if you do not have one) for your aftercare needs so that they can reassess your need for medications and monitor your lab values.   Increase activity slowly    Complete by:  As  directed       Discharge Medications   Allergies as of 09/22/2016   No Known Allergies     Medication List    STOP taking these medications   amLODipine 10 MG tablet Commonly known as:  NORVASC     TAKE these medications   aspirin 325 MG tablet Take 325 mg by mouth daily.   atorvastatin 40 MG tablet Commonly known as:  LIPITOR TAKE 1 TABLET BY MOUTH DAILY.   calcium carbonate 500 MG chewable tablet Commonly known as:  TUMS - dosed in mg elemental calcium Chew 1 tablet (200 mg of elemental calcium total) by mouth 2 (two) times daily.   carvedilol 6.25 MG tablet Commonly known as:  COREG Take 6.25 mg by mouth 2 (two) times daily with a meal.   carvedilol 12.5 MG tablet Commonly known as:  COREG Take 12.5 mg by mouth 2 (two) times daily.   furosemide 40 MG tablet Commonly known as:  LASIX Take 2 tablets (80 mg total) by mouth 2 (two) times daily. What changed:  See the new instructions.   hydrALAZINE 50 MG tablet Commonly known as:  APRESOLINE Take 0.5 tablets (25 mg total) by mouth 3 (three) times daily.   isosorbide mononitrate 30 MG 24 hr tablet Commonly known as:  IMDUR Take 1 tablet (30 mg total) by mouth daily.   lisinopril 20 MG tablet Commonly known as:  PRINIVIL,ZESTRIL TAKE 1 TABLET BY MOUTH DAILY.   nitroGLYCERIN 0.4 MG SL tablet Commonly known as:  NITROSTAT Place 1 tablet (0.4 mg total) under the tongue every 5 (five)  minutes x 3 doses as needed for chest pain.   potassium chloride 10 MEQ tablet Commonly known as:  K-DUR Take 10 mEq by mouth daily.   TRAVATAN Z 0.004 % Soln ophthalmic solution Generic drug:  Travoprost (BAK Free) Place 1 drop into both eyes at bedtime.   Vitamin D (Cholecalciferol) 400 units Caps Take 400 Units by mouth daily.       Follow-up Information    [name], MD. Schedule an appointment as soon as possible for a visit in 3 day(s).   Specialty:  Family Medicine Contact information: [place] [id number] [phone]        [facility] S, MD. Schedule an appointment as soon as possible for a visit in 3 day(s).   Specialty:  Cardiology Why:  CHF Contact information: [place] [address] [phone]           Major procedures and Radiology Reports - PLEASE review detailed and final reports thoroughly  -     Echo 4/27: - Left ventricle: The cavity size was normal. Systolic function wasmildly reduced. The estimated ejection fraction was in the rangeof 45% to 50%. There is moderate hypokinesis of the inferiormyocardium. Doppler parameters are consistent with abnormal leftventricular relaxation (grade 1 diastolic dysfunction).     Dg Chest 2 View  Result Date: 09/19/2016 CLINICAL DATA:  Shortness of breath. EXAM: CHEST  2 VIEW COMPARISON:  12/11/2015 FINDINGS: Lung volumes remain diminished, slightly more so than on the prior study. There is chronic elevation of the left hemidiaphragm with rightward shift of the heart, similar to the prior study. Left basilar opacities have increased. No right lung consolidation is seen. Mild central peribronchial thickening is similar to the prior study. No sizable pleural effusion or pneumothorax is identified. No acute osseous abnormality is seen. IMPRESSION: Low lung volumes with chronic elevation of the left  hemidiaphragm. Mild left basilar opacities have increased and  likely represent atelectasis. Electronically Signed   By: Logan Bores M.D.   On: 09/19/2016 10:06    Micro Results     No results found for this or any previous visit (from the past 240 hour(s)).  Today   Subjective    [place] Foot today has no headache,no chest abdominal pain,no new weakness tingling or numbness, feels much better wants to go home today.     Objective   Blood pressure (!) 106/52, pulse (!) 55, temperature 97.9 F (36.6 C), temperature source Oral, resp. rate 20, height 5\' 10"  (1.778 m), weight (!) 158.6 kg (349 lb 9.6 oz), SpO2 96 %.   Intake/Output Summary (Last 24 hours) at 09/22/16 1037 Last data filed at 09/22/16 0911  Gross per 24 hour  Intake              840 ml  Output             2550 ml  Net            -1710 ml    Exam Awake Alert, Oriented x 3, No new F.N deficits, Normal affect [place].AT,PERRAL Supple Neck,No JVD, No cervical lymphadenopathy appriciated.  Symmetrical Chest wall movement, Good air movement bilaterally, CTAB RRR,No Gallops,Rubs or new Murmurs, No Parasternal Heave +ve B.Sounds, Abd Soft, Non tender, No organomegaly appriciated, No rebound -guarding or rigidity. No Cyanosis, Clubbing , trace edema, No new Rash or bruise   Data Review   CBC w Diff: Lab Results  Component Value Date   WBC 6.8 09/19/2016   HGB 11.9 (L) 09/19/2016   HCT 41.1 09/19/2016   PLT 217 09/19/2016   LYMPHOPCT 15 09/19/2016   MONOPCT 11 09/19/2016   EOSPCT 1 09/19/2016   BASOPCT 0 09/19/2016    CMP: Lab Results  Component Value Date   NA 141 09/22/2016   K 4.4 09/22/2016   CL 95 (L) 09/22/2016   CO2 37 (H) 09/22/2016   BUN 29 (H) 09/22/2016   CREATININE 2.74 (H) 09/22/2016   CREATININE 2.89 (H) 11/30/2015   PROT 7.0 09/19/2016   ALBUMIN 2.9 (L) 09/19/2016   BILITOT 0.3 09/19/2016   ALKPHOS 44 09/19/2016   AST 20 09/19/2016   ALT 18 09/19/2016  .   Total Time in preparing paper work, data evaluation and todays exam - 8  minutes  Lala Lund M.D on 09/22/2016 at 10:37 AM  [facility]   Office  [phone]

## 2016-09-22 NOTE — Progress Notes (Signed)
Pt got discharged, discharge instructions provided and patient showed understanding to it, IV taken out,Telemonitor DC,pt left unit in wheelchair with all of the belongings accompanied by his friend

## 2016-09-23 ENCOUNTER — Telehealth: Payer: Self-pay | Admitting: Family Medicine

## 2016-09-23 ENCOUNTER — Ambulatory Visit: Payer: Medicaid Other | Admitting: Family Medicine

## 2016-09-23 MED FILL — LISINOPRIL 20 MG TABLET: 20 | 30 days supply | Qty: 30 | Fill #0 | Status: TO

## 2016-09-23 MED FILL — ATORVASTATIN 40 MG TABLET: 40 | 30 days supply | Qty: 30 | Fill #3 | Status: TO

## 2016-09-23 NOTE — Telephone Encounter (Signed)
Pt calling stating that [facility] needs an order to marry his O2 and C-Pap machine together. Pt requests orders to be sent to [facility]. Would also like mask to be for nose, not entire face.  Please f/u. Thank you.

## 2016-09-23 NOTE — Telephone Encounter (Signed)
Will route to PCP 

## 2016-09-25 ENCOUNTER — Ambulatory Visit: Payer: Medicaid Other | Attending: Internal Medicine | Admitting: Internal Medicine

## 2016-09-25 ENCOUNTER — Encounter: Payer: Self-pay | Admitting: Internal Medicine

## 2016-09-25 DIAGNOSIS — E662 Morbid (severe) obesity with alveolar hypoventilation: Secondary | ICD-10-CM | POA: Insufficient documentation

## 2016-09-25 DIAGNOSIS — Z7982 Long term (current) use of aspirin: Secondary | ICD-10-CM | POA: Diagnosis not present

## 2016-09-25 DIAGNOSIS — Z6841 Body Mass Index (BMI) 40.0 and over, adult: Secondary | ICD-10-CM | POA: Diagnosis not present

## 2016-09-25 DIAGNOSIS — I5042 Chronic combined systolic (congestive) and diastolic (congestive) heart failure: Secondary | ICD-10-CM | POA: Diagnosis not present

## 2016-09-25 NOTE — Assessment & Plan Note (Signed)
He needs O2 set up with CPAP

## 2016-09-25 NOTE — Progress Notes (Signed)
Hospitalized for respiratory failure due to acute CHF (diast/syst). He is feeling much better. He has been on long term oxygen. He has been compliant with meds. Denies CP or SOB currently. He is morbidly obese  Past Medical History:  Diagnosis Date  . CHF (congestive heart failure) ([place])   . Chronic combined systolic and diastolic heart failure ([place])   . Hypertension   . Hypertrophy of tonsils alone   . Obesity, unspecified   . Obstructive sleep apnea (adult) (pediatric)     Social History   Social History  . Marital status: Single    Spouse name: N/A  . Number of children: 0  . Years of education: N/A   Occupational History  . works as a Garment/textile technologist History Main Topics  . Smoking status: Never Smoker  . Smokeless tobacco: Never Used  . Alcohol use No  . Drug use: No  . Sexual activity: Not on file   Other Topics Concern  . Not on file   Social History Narrative  . No narrative on file    Past Surgical History:  Procedure Laterality Date  . UMBILICAL HERNIA REPAIR      Family History  Problem Relation Age of Onset  . Hypertension Mother   . Diabetes Brother   . Diabetes Sister   . Cancer Maternal Aunt     No Known Allergies  Current Outpatient Prescriptions on File Prior to Visit  Medication Sig Dispense Refill  . aspirin 325 MG tablet Take 325 mg by mouth daily.    Marland Kitchen atorvastatin (LIPITOR) 40 MG tablet TAKE 1 TABLET BY MOUTH DAILY. 30 tablet 3  . calcium carbonate (TUMS - DOSED IN MG ELEMENTAL CALCIUM) 500 MG chewable tablet Chew 1 tablet (200 mg of elemental calcium total) by mouth 2 (two) times daily.    . carvedilol (COREG) 12.5 MG tablet Take 12.5 mg by mouth 2 (two) times daily.  0  . carvedilol (COREG) 6.25 MG tablet Take 6.25 mg by mouth 2 (two) times daily with a meal.    . furosemide (LASIX) 40 MG tablet Take 2 tablets (80 mg total) by mouth 2 (two) times daily. 60 tablet 0  . hydrALAZINE (APRESOLINE) 50 MG tablet Take 0.5 tablets (25 mg  total) by mouth 3 (three) times daily. 90 tablet 0  . isosorbide mononitrate (IMDUR) 30 MG 24 hr tablet Take 1 tablet (30 mg total) by mouth daily. 30 tablet 0  . lisinopril (PRINIVIL,ZESTRIL) 20 MG tablet TAKE 1 TABLET BY MOUTH DAILY. 30 tablet 0  . nitroGLYCERIN (NITROSTAT) 0.4 MG SL tablet Place 1 tablet (0.4 mg total) under the tongue every 5 (five) minutes x 3 doses as needed for chest pain. 25 tablet 1  . potassium chloride (K-DUR) 10 MEQ tablet Take 10 mEq by mouth daily.    . TRAVATAN Z 0.004 % SOLN ophthalmic solution Place 1 drop into both eyes at bedtime.  12  . Vitamin D, Cholecalciferol, 400 units CAPS Take 400 Units by mouth daily.     No current facility-administered medications on file prior to visit.      patient denies chest pain, shortness of breath, orthopnea. Denies lower extremity edema, abdominal pain, change in appetite, change in bowel movements. Patient denies rashes, musculoskeletal complaints. No other specific complaints in a complete review of systems.   BP 114/61   Pulse 78   Temp 97.4 F (36.3 C) (Oral)   Ht 5\' 11"  (1.803 m)   Wt Marland Kitchen)  352 lb 9.6 oz (159.9 kg)   SpO2 95%   BMI 49.18 kg/m   well-developed well-nourished male in no acute distress. Wearing oxygen. HEENT exam atraumatic, normocephalic, neck supple without jugular venous distention. Chest clear to auscultation cardiac exam S1-S2 are regular. Abdominal exam overweight with bowel sounds, soft and nontender. Extremities no edema. Neurologic exam is alert with a normal gait.   Chronic combined systolic and diastolic CHF (congestive heart failure) Recent exacerbation He is clearly improved and back to baseline. He is at high risk of recurrence given his obesity and OSA. Discussed need for weight loss.  Continue meds for now.   f/u 4 weeks.   Obesity hypoventilation syndrome He needs O2 set up with CPAP

## 2016-09-25 NOTE — Assessment & Plan Note (Signed)
Recent exacerbation He is clearly improved and back to baseline. He is at high risk of recurrence given his obesity and OSA. Discussed need for weight loss.  Continue meds for now.   f/u 4 weeks.

## 2016-09-27 ENCOUNTER — Telehealth: Payer: Self-pay

## 2016-09-27 NOTE — Telephone Encounter (Signed)
Home Health referral  received from Dr Leanne Chang requesting that the patient's O2 be compliant with this CPAP, he is not in need of home health services.  Call placed to [facility] to determine what documentation is needed to process the order. This CM spoke to [facility] who stated that a prescription for an enrichment port is needed, along with the patient's insurance information and clinical documentation from the provider. This information was shared with Dr Adrian Blackwater.  As per [facility], the patient has his O2 from [facility]; but the CPAP is not from [facility].  He noted that adapting the CPAP/O2 does not require that both pieces of equipment come from [facility].

## 2016-09-27 NOTE — Telephone Encounter (Signed)
Order provided.

## 2016-09-27 NOTE — Telephone Encounter (Signed)
Prescription for the enrichment port faxed to [facility] - fax # [phone]

## 2016-09-30 ENCOUNTER — Telehealth: Payer: Self-pay

## 2016-09-30 NOTE — Telephone Encounter (Signed)
Call placed to [facility] to confirm receipt of the order for the enrichment port. Spoke to [place] who said that it was received and a ticket has been placed for it to be shipped.

## 2016-10-07 ENCOUNTER — Other Ambulatory Visit: Payer: Self-pay | Admitting: Family Medicine

## 2016-10-07 DIAGNOSIS — E1169 Type 2 diabetes mellitus with other specified complication: Secondary | ICD-10-CM

## 2016-10-07 DIAGNOSIS — I10 Essential (primary) hypertension: Secondary | ICD-10-CM

## 2016-10-07 DIAGNOSIS — I5042 Chronic combined systolic (congestive) and diastolic (congestive) heart failure: Secondary | ICD-10-CM

## 2016-10-07 DIAGNOSIS — E669 Obesity, unspecified: Secondary | ICD-10-CM

## 2016-10-08 MED ORDER — FUROSEMIDE 40 MG PO TABS: 80.0000 mg | ORAL_TABLET | Freq: Two times a day (BID) | ORAL | 2 refills | Status: DC

## 2016-10-08 NOTE — Addendum Note (Signed)
Addended by: Boykin Nearing on: 10/08/2016 08:29 AM   Modules accepted: Orders

## 2016-10-09 ENCOUNTER — Encounter: Payer: Self-pay | Admitting: Family Medicine

## 2016-10-30 ENCOUNTER — Ambulatory Visit: Payer: Medicaid Other | Admitting: Podiatry

## 2016-11-07 ENCOUNTER — Ambulatory Visit: Payer: Medicaid Other | Admitting: Family Medicine

## 2016-11-19 ENCOUNTER — Other Ambulatory Visit: Payer: Self-pay | Admitting: Family Medicine

## 2016-12-02 ENCOUNTER — Telehealth: Payer: Self-pay | Admitting: Family Medicine

## 2016-12-02 MED ORDER — HYDRALAZINE HCL 50 MG PO TABS: 25.0000 mg | ORAL_TABLET | Freq: Three times a day (TID) | ORAL | 1 refills | Status: DC

## 2016-12-02 MED ORDER — ISOSORBIDE MONONITRATE ER 30 MG PO TB24: 30.0000 mg | ORAL_TABLET | Freq: Every day | ORAL | 1 refills | Status: DC

## 2016-12-02 NOTE — Telephone Encounter (Signed)
meds refilled Please inform patient

## 2016-12-02 NOTE — Telephone Encounter (Signed)
Pt called to request a refill for  hydrALAZINE (APRESOLINE) 50 MG tablet isosorbide mononitrate (IMDUR) 30 MG 24 hr tablet   Please if you auth these med, since it was prescribe by the ED please call the Pt,  Also sent the med to the [place] on [address]  please follow up

## 2016-12-02 NOTE — Telephone Encounter (Signed)
Will route to PCP 

## 2016-12-03 ENCOUNTER — Telehealth: Payer: Self-pay

## 2016-12-03 NOTE — Telephone Encounter (Signed)
Pt was called and informed of refilled being placed.

## 2016-12-15 ENCOUNTER — Other Ambulatory Visit: Payer: Self-pay | Admitting: Internal Medicine

## 2016-12-16 ENCOUNTER — Ambulatory Visit (HOSPITAL_COMMUNITY)
Admission: RE | Admit: 2016-12-16 | Discharge: 2016-12-16 | Disposition: A | Discharge status: Home / Self Care | Payer: Medicaid Other | Source: Ambulatory Visit | Attending: Family Medicine | Admitting: Family Medicine

## 2016-12-16 ENCOUNTER — Encounter: Payer: Self-pay | Admitting: Family Medicine

## 2016-12-16 ENCOUNTER — Ambulatory Visit: Payer: Medicaid Other | Attending: Family Medicine | Admitting: Family Medicine

## 2016-12-16 VITALS — BP 129/77 | HR 59 | Temp 97.8°F | Ht 71.0 in | Wt 357.2 lb

## 2016-12-16 DIAGNOSIS — Z7982 Long term (current) use of aspirin: Secondary | ICD-10-CM | POA: Insufficient documentation

## 2016-12-16 DIAGNOSIS — R109 Unspecified abdominal pain: Secondary | ICD-10-CM | POA: Diagnosis not present

## 2016-12-16 DIAGNOSIS — E119 Type 2 diabetes mellitus without complications: Secondary | ICD-10-CM | POA: Insufficient documentation

## 2016-12-16 DIAGNOSIS — I517 Cardiomegaly: Secondary | ICD-10-CM | POA: Insufficient documentation

## 2016-12-16 DIAGNOSIS — E669 Obesity, unspecified: Secondary | ICD-10-CM | POA: Insufficient documentation

## 2016-12-16 DIAGNOSIS — R918 Other nonspecific abnormal finding of lung field: Secondary | ICD-10-CM | POA: Insufficient documentation

## 2016-12-16 DIAGNOSIS — I110 Hypertensive heart disease with heart failure: Secondary | ICD-10-CM | POA: Insufficient documentation

## 2016-12-16 DIAGNOSIS — I10 Essential (primary) hypertension: Secondary | ICD-10-CM | POA: Diagnosis not present

## 2016-12-16 DIAGNOSIS — E1169 Type 2 diabetes mellitus with other specified complication: Secondary | ICD-10-CM | POA: Diagnosis not present

## 2016-12-16 DIAGNOSIS — Z79899 Other long term (current) drug therapy: Secondary | ICD-10-CM | POA: Diagnosis not present

## 2016-12-16 DIAGNOSIS — I5042 Chronic combined systolic (congestive) and diastolic (congestive) heart failure: Secondary | ICD-10-CM | POA: Diagnosis not present

## 2016-12-16 DIAGNOSIS — I700 Atherosclerosis of aorta: Secondary | ICD-10-CM | POA: Diagnosis not present

## 2016-12-16 LAB — POCT URINALYSIS DIPSTICK
BILIRUBIN UA: NEGATIVE
Glucose, UA: NEGATIVE
KETONES UA: NEGATIVE
LEUKOCYTES UA: NEGATIVE
Nitrite, UA: NEGATIVE
PROTEIN UA: 100
Spec Grav, UA: 1.015 (ref 1.010–1.025)
Urobilinogen, UA: 1 E.U./dL
pH, UA: 7 (ref 5.0–8.0)

## 2016-12-16 LAB — POCT GLYCOSYLATED HEMOGLOBIN (HGB A1C): HEMOGLOBIN A1C: 6.2

## 2016-12-16 LAB — GLUCOSE, POCT (MANUAL RESULT ENTRY): POC Glucose: 81 mg/dl (ref 70–99)

## 2016-12-16 MED ORDER — ATORVASTATIN CALCIUM 40 MG PO TABS: 40.0000 mg | ORAL_TABLET | Freq: Every day | ORAL | 3 refills | Status: DC

## 2016-12-16 MED ORDER — LISINOPRIL 20 MG PO TABS: 20.0000 mg | ORAL_TABLET | Freq: Every day | ORAL | 3 refills | Status: DC

## 2016-12-16 NOTE — Patient Instructions (Addendum)
Adam Dennis was seen today for congestive heart failure, hypertension and sleep apnea.  Diagnoses and all orders for this visit:  Diabetes mellitus type 2 in obese ([place]) -     POCT glucose (manual entry) -     POCT glycosylated hemoglobin (Hb A1C)  Right flank pain -     Urinalysis Dipstick -     DG Chest 2 View; Future  Essential hypertension, benign   There is trace blood in your urine, this could be done to a kidney stone  Please increase intake of water  Please complete chest x-ray  F/u in 3 months for HTN and diabetes   Dr. Adrian Blackwater

## 2016-12-16 NOTE — Progress Notes (Signed)
Subjective:  Patient ID: Adam Dennis, male    DOB: 1960/12/17  Age: 56 y.o. MRN: [id number]  CC: Congestive Heart Failure; Hypertension; and Sleep Apnea   HPI Adam Dennis has HTN, diabetes, obesity, diastolic CHF he  presents  for    1. R side pain: for the 3-4 days. When lying down on his R side. Improve with lying on back. Resolved with sitting or standing up. No rash,  fever, chills, cough, shortness of breath. No change in activity prior to onset of pain.   2. CHRONIC HYPERTENSION and CHF: compliant with coreg 12.5 mg BID, lisinopril 20 mg daily, lasix 80 mg BID and hydralazine 25 mg TID and imdur 30 mg daily.   3. Diabetes: taking Lipitor 40 mg daily and aspirin. Takes lasix and has polyuria. Denies polydipsia.   Social History  Substance Use Topics  . Smoking status: Never Smoker  . Smokeless tobacco: Never Used  . Alcohol use No    Outpatient Medications Prior to Visit  Medication Sig Dispense Refill  . aspirin 325 MG tablet Take 325 mg by mouth daily.    Marland Kitchen atorvastatin (LIPITOR) 40 MG tablet take 1 tablet by mouth once daily 30 tablet 0  . calcium carbonate (TUMS - DOSED IN MG ELEMENTAL CALCIUM) 500 MG chewable tablet Chew 1 tablet (200 mg of elemental calcium total) by mouth 2 (two) times daily.    . carvedilol (COREG) 12.5 MG tablet Take 12.5 mg by mouth 2 (two) times daily.  0  . carvedilol (COREG) 6.25 MG tablet Take 6.25 mg by mouth 2 (two) times daily with a meal.    . furosemide (LASIX) 40 MG tablet Take 2 tablets (80 mg total) by mouth 2 (two) times daily. 120 tablet 2  . hydrALAZINE (APRESOLINE) 50 MG tablet Take 0.5 tablets (25 mg total) by mouth 3 (three) times daily. 90 tablet 1  . isosorbide mononitrate (IMDUR) 30 MG 24 hr tablet Take 1 tablet (30 mg total) by mouth daily. 30 tablet 1  . lisinopril (PRINIVIL,ZESTRIL) 20 MG tablet take 1 tablet by mouth once daily 30 tablet 0  . nitroGLYCERIN (NITROSTAT) 0.4 MG SL tablet Place 1 tablet (0.4 mg total)  under the tongue every 5 (five) minutes x 3 doses as needed for chest pain. 25 tablet 1  . potassium chloride (K-DUR) 10 MEQ tablet Take 10 mEq by mouth daily.    . TRAVATAN Z 0.004 % SOLN ophthalmic solution Place 1 drop into both eyes at bedtime.  12  . Vitamin D, Cholecalciferol, 400 units CAPS Take 400 Units by mouth daily.     No facility-administered medications prior to visit.     ROS Review of Systems  Constitutional: Negative for chills, fatigue, fever and unexpected weight change.  Eyes: Negative for visual disturbance.  Respiratory: Negative for cough and shortness of breath.   Cardiovascular: Negative for chest pain, palpitations and leg swelling.  Gastrointestinal: Negative for abdominal pain, blood in stool, constipation, diarrhea, nausea and vomiting.  Endocrine: Negative for polydipsia, polyphagia and polyuria.  Genitourinary: Positive for flank pain.  Musculoskeletal: Negative for arthralgias, back pain, gait problem, myalgias and neck pain.  Skin: Negative for rash.  Allergic/Immunologic: Negative for immunocompromised state.  Hematological: Negative for adenopathy. Does not bruise/bleed easily.  Psychiatric/Behavioral: Negative for dysphoric mood, sleep disturbance and suicidal ideas. The patient is not nervous/anxious.     Objective:  BP (!) 149/88   Pulse (!) 59   Temp 97.8 F (36.6 C) (  Oral)   Ht 5\' 11"  (1.803 m)   Wt (!) 357 lb 3.2 oz (162 kg)   SpO2 96%   BMI 49.82 kg/m   BP/Weight 12/16/2016 09/25/2016 2/29/7989  Systolic BP 211 941 740  Diastolic BP 88 61 69  Wt. (Lbs) 357.2 352.6 349.6  BMI 49.82 49.18 50.16    Physical Exam  Constitutional: He appears well-developed and well-nourished. No distress.  HENT:  Head: Normocephalic and atraumatic.  Neck: Normal range of motion. Neck supple.  Cardiovascular: Normal rate, regular rhythm, normal heart sounds and intact distal pulses.   Pulmonary/Chest: Effort normal and breath sounds normal.    Musculoskeletal: He exhibits no edema.  Neurological: He is alert.  Skin: Skin is warm and dry. No rash noted. No erythema.  Psychiatric: He has a normal mood and affect.    Lab Results  Component Value Date   HGBA1C 6.2 12/16/2016   CBG 122 UA: trace-lysed, blood otherwise normal  Assessment & Plan:  Adam Dennis was seen today for congestive heart failure, hypertension and sleep apnea.  Diagnoses and all orders for this visit:  Diabetes mellitus type 2 in obese ([place]) -     POCT glucose (manual entry) -     POCT glycosylated hemoglobin (Hb A1C) -     atorvastatin (LIPITOR) 40 MG tablet; Take 1 tablet (40 mg total) by mouth daily.  Right flank pain -     Urinalysis Dipstick -     DG Chest 2 View; Future  Essential hypertension, benign -     lisinopril (PRINIVIL,ZESTRIL) 20 MG tablet; Take 1 tablet (20 mg total) by mouth daily. -     furosemide (LASIX) 40 MG tablet; Take 2 tablets (80 mg total) by mouth 2 (two) times daily. -     carvedilol (COREG) 12.5 MG tablet; Take 1 tablet (12.5 mg total) by mouth 2 (two) times daily. -     isosorbide mononitrate (IMDUR) 30 MG 24 hr tablet; Take 1 tablet (30 mg total) by mouth daily. -     hydrALAZINE (APRESOLINE) 50 MG tablet; Take 0.5 tablets (25 mg total) by mouth 3 (three) times daily.  Chronic combined systolic and diastolic CHF (congestive heart failure) (HCC) -     lisinopril (PRINIVIL,ZESTRIL) 20 MG tablet; Take 1 tablet (20 mg total) by mouth daily. -     furosemide (LASIX) 40 MG tablet; Take 2 tablets (80 mg total) by mouth 2 (two) times daily. -     carvedilol (COREG) 12.5 MG tablet; Take 1 tablet (12.5 mg total) by mouth 2 (two) times daily. -     isosorbide mononitrate (IMDUR) 30 MG 24 hr tablet; Take 1 tablet (30 mg total) by mouth daily. -     hydrALAZINE (APRESOLINE) 50 MG tablet; Take 0.5 tablets (25 mg total) by mouth 3 (three) times daily.   There are no diagnoses linked to this encounter.  No orders of the defined types  were placed in this encounter.   Follow-up: Return in about 3 months (around 03/18/2017) for HTN and diabetes .   Boykin Nearing MD

## 2016-12-18 ENCOUNTER — Telehealth: Payer: Self-pay

## 2016-12-18 NOTE — Telephone Encounter (Signed)
Pt was called and informed of lab results. 

## 2016-12-22 DIAGNOSIS — R109 Unspecified abdominal pain: Secondary | ICD-10-CM | POA: Insufficient documentation

## 2016-12-22 MED ORDER — CARVEDILOL 12.5 MG PO TABS: 12.5000 mg | ORAL_TABLET | Freq: Two times a day (BID) | ORAL | 2 refills | Status: DC

## 2016-12-22 MED ORDER — HYDRALAZINE HCL 50 MG PO TABS: 25.0000 mg | ORAL_TABLET | Freq: Three times a day (TID) | ORAL | 2 refills | Status: DC

## 2016-12-22 MED ORDER — FUROSEMIDE 40 MG PO TABS: 80.0000 mg | ORAL_TABLET | Freq: Two times a day (BID) | ORAL | 2 refills | Status: DC

## 2016-12-22 MED ORDER — ISOSORBIDE MONONITRATE ER 30 MG PO TB24: 30.0000 mg | ORAL_TABLET | Freq: Every day | ORAL | 5 refills | Status: DC

## 2016-12-22 NOTE — Assessment & Plan Note (Addendum)
A: hypertensive P: Refilled  Coreg 12.5 mg BID, lisinopril 20 mg daily, lasix 80 mg BID, hydralazine 25 mg TID and imdur 30 mg daily

## 2016-12-22 NOTE — Assessment & Plan Note (Signed)
Diet controlled Continue statin and aspirin

## 2016-12-22 NOTE — Assessment & Plan Note (Signed)
R flank pain Trace blood on UA No fever, nausea or emesis  Possible renal stone Increase fluid intake

## 2017-01-15 ENCOUNTER — Encounter (HOSPITAL_COMMUNITY): Payer: Self-pay

## 2017-01-15 ENCOUNTER — Inpatient Hospital Stay (HOSPITAL_COMMUNITY)
Admission: EM | Admit: 2017-01-15 | Discharge: 2017-01-17 | DRG: 291 | Disposition: A | Discharge status: Home / Self Care | Payer: Medicaid Other | Attending: Family Medicine | Admitting: Family Medicine

## 2017-01-15 ENCOUNTER — Emergency Department (HOSPITAL_COMMUNITY): Payer: Medicaid Other

## 2017-01-15 DIAGNOSIS — J9621 Acute and chronic respiratory failure with hypoxia: Secondary | ICD-10-CM | POA: Diagnosis present

## 2017-01-15 DIAGNOSIS — I130 Hypertensive heart and chronic kidney disease with heart failure and stage 1 through stage 4 chronic kidney disease, or unspecified chronic kidney disease: Principal | ICD-10-CM | POA: Diagnosis present

## 2017-01-15 DIAGNOSIS — G4733 Obstructive sleep apnea (adult) (pediatric): Secondary | ICD-10-CM | POA: Diagnosis not present

## 2017-01-15 DIAGNOSIS — E66813 Obesity, class 3: Secondary | ICD-10-CM | POA: Diagnosis present

## 2017-01-15 DIAGNOSIS — N184 Chronic kidney disease, stage 4 (severe): Secondary | ICD-10-CM | POA: Diagnosis present

## 2017-01-15 DIAGNOSIS — E669 Obesity, unspecified: Secondary | ICD-10-CM | POA: Diagnosis not present

## 2017-01-15 DIAGNOSIS — R0602 Shortness of breath: Secondary | ICD-10-CM

## 2017-01-15 DIAGNOSIS — I1311 Hypertensive heart and chronic kidney disease without heart failure, with stage 5 chronic kidney disease, or end stage renal disease: Secondary | ICD-10-CM | POA: Diagnosis present

## 2017-01-15 DIAGNOSIS — I5043 Acute on chronic combined systolic (congestive) and diastolic (congestive) heart failure: Secondary | ICD-10-CM | POA: Diagnosis present

## 2017-01-15 DIAGNOSIS — Z79899 Other long term (current) drug therapy: Secondary | ICD-10-CM

## 2017-01-15 DIAGNOSIS — I2510 Atherosclerotic heart disease of native coronary artery without angina pectoris: Secondary | ICD-10-CM | POA: Diagnosis present

## 2017-01-15 DIAGNOSIS — I509 Heart failure, unspecified: Secondary | ICD-10-CM

## 2017-01-15 DIAGNOSIS — I5023 Acute on chronic systolic (congestive) heart failure: Secondary | ICD-10-CM | POA: Diagnosis not present

## 2017-01-15 DIAGNOSIS — D638 Anemia in other chronic diseases classified elsewhere: Secondary | ICD-10-CM | POA: Diagnosis present

## 2017-01-15 DIAGNOSIS — E662 Morbid (severe) obesity with alveolar hypoventilation: Secondary | ICD-10-CM | POA: Diagnosis present

## 2017-01-15 DIAGNOSIS — H409 Unspecified glaucoma: Secondary | ICD-10-CM | POA: Diagnosis present

## 2017-01-15 DIAGNOSIS — Z833 Family history of diabetes mellitus: Secondary | ICD-10-CM | POA: Diagnosis not present

## 2017-01-15 DIAGNOSIS — Z8249 Family history of ischemic heart disease and other diseases of the circulatory system: Secondary | ICD-10-CM | POA: Diagnosis not present

## 2017-01-15 DIAGNOSIS — H4010X0 Unspecified open-angle glaucoma, stage unspecified: Secondary | ICD-10-CM

## 2017-01-15 DIAGNOSIS — Z6841 Body Mass Index (BMI) 40.0 and over, adult: Secondary | ICD-10-CM | POA: Diagnosis not present

## 2017-01-15 DIAGNOSIS — J9610 Chronic respiratory failure, unspecified whether with hypoxia or hypercapnia: Secondary | ICD-10-CM | POA: Diagnosis present

## 2017-01-15 DIAGNOSIS — R0902 Hypoxemia: Secondary | ICD-10-CM

## 2017-01-15 DIAGNOSIS — E785 Hyperlipidemia, unspecified: Secondary | ICD-10-CM | POA: Diagnosis not present

## 2017-01-15 DIAGNOSIS — E1122 Type 2 diabetes mellitus with diabetic chronic kidney disease: Secondary | ICD-10-CM | POA: Diagnosis present

## 2017-01-15 DIAGNOSIS — E119 Type 2 diabetes mellitus without complications: Secondary | ICD-10-CM | POA: Diagnosis present

## 2017-01-15 DIAGNOSIS — I10 Essential (primary) hypertension: Secondary | ICD-10-CM | POA: Diagnosis present

## 2017-01-15 DIAGNOSIS — N186 End stage renal disease: Secondary | ICD-10-CM | POA: Diagnosis present

## 2017-01-15 DIAGNOSIS — E1169 Type 2 diabetes mellitus with other specified complication: Secondary | ICD-10-CM | POA: Diagnosis not present

## 2017-01-15 HISTORY — DX: Chronic kidney disease, stage 4 (severe): N18.4

## 2017-01-15 LAB — URINALYSIS, ROUTINE W REFLEX MICROSCOPIC
BACTERIA UA: NONE SEEN
BILIRUBIN URINE: NEGATIVE
Glucose, UA: NEGATIVE mg/dL
Ketones, ur: NEGATIVE mg/dL
Leukocytes, UA: NEGATIVE
Nitrite: NEGATIVE
PROTEIN: 100 mg/dL — AB
SPECIFIC GRAVITY, URINE: 1.01 (ref 1.005–1.030)
pH: 6 (ref 5.0–8.0)

## 2017-01-15 LAB — CBC
HCT: 41.7 % (ref 39.0–52.0)
Hemoglobin: 12.8 g/dL — ABNORMAL LOW (ref 13.0–17.0)
MCH: 26.1 pg (ref 26.0–34.0)
MCHC: 30.7 g/dL (ref 30.0–36.0)
MCV: 84.9 fL (ref 78.0–100.0)
PLATELETS: 210 10*3/uL (ref 150–400)
RBC: 4.91 MIL/uL (ref 4.22–5.81)
RDW: 17.9 % — ABNORMAL HIGH (ref 11.5–15.5)
WBC: 6.8 10*3/uL (ref 4.0–10.5)

## 2017-01-15 LAB — BASIC METABOLIC PANEL
Anion gap: 4 — ABNORMAL LOW (ref 5–15)
BUN: 28 mg/dL — AB (ref 6–20)
CALCIUM: 8.2 mg/dL — AB (ref 8.9–10.3)
CHLORIDE: 106 mmol/L (ref 101–111)
CO2: 31 mmol/L (ref 22–32)
CREATININE: 2.74 mg/dL — AB (ref 0.61–1.24)
GFR calc Af Amer: 28 mL/min — ABNORMAL LOW (ref >60)
GFR calc non Af Amer: 24 mL/min — ABNORMAL LOW (ref >60)
GLUCOSE: 138 mg/dL — AB (ref 65–99)
Potassium: 4.7 mmol/L (ref 3.5–5.1)
Sodium: 141 mmol/L (ref 135–145)

## 2017-01-15 LAB — I-STAT TROPONIN, ED: TROPONIN I, POC: 0.01 ng/mL (ref 0.00–0.08)

## 2017-01-15 LAB — BRAIN NATRIURETIC PEPTIDE: B Natriuretic Peptide: 692.5 pg/mL — ABNORMAL HIGH (ref 0.0–100.0)

## 2017-01-15 MED ORDER — SODIUM CHLORIDE 0.9% FLUSH
3.0000 mL | Freq: Two times a day (BID) | INTRAVENOUS | Status: DC
  Administered 2017-01-15 – 2017-01-17 (×4): 3 mL via INTRAVENOUS

## 2017-01-15 MED ORDER — FUROSEMIDE 10 MG/ML IJ SOLN
80.0000 mg | Freq: Two times a day (BID) | INTRAMUSCULAR | Status: DC
  Administered 2017-01-15 – 2017-01-17 (×4): 80 mg via INTRAVENOUS
  Filled 2017-01-15 (×4): qty 8

## 2017-01-15 MED ORDER — CARVEDILOL 12.5 MG PO TABS
12.5000 mg | ORAL_TABLET | Freq: Two times a day (BID) | ORAL | Status: DC
  Administered 2017-01-15 – 2017-01-17 (×4): 12.5 mg via ORAL
  Filled 2017-01-15 (×4): qty 1

## 2017-01-15 MED ORDER — ASPIRIN 325 MG PO TABS
325.0000 mg | ORAL_TABLET | Freq: Every day | ORAL | Status: DC
  Administered 2017-01-16 – 2017-01-17 (×2): 325 mg via ORAL
  Filled 2017-01-15 (×2): qty 1

## 2017-01-15 MED ORDER — LISINOPRIL 20 MG PO TABS
20.0000 mg | ORAL_TABLET | Freq: Every day | ORAL | Status: DC
  Administered 2017-01-16 – 2017-01-17 (×2): 20 mg via ORAL
  Filled 2017-01-15 (×2): qty 1

## 2017-01-15 MED ORDER — ISOSORBIDE MONONITRATE ER 30 MG PO TB24
30.0000 mg | ORAL_TABLET | Freq: Every day | ORAL | Status: DC
  Administered 2017-01-16 – 2017-01-17 (×2): 30 mg via ORAL
  Filled 2017-01-15 (×2): qty 1

## 2017-01-15 MED ORDER — NITROGLYCERIN 0.4 MG SL SUBL: 0.4000 mg | SUBLINGUAL_TABLET | SUBLINGUAL | Status: DC | PRN

## 2017-01-15 MED ORDER — ATORVASTATIN CALCIUM 40 MG PO TABS
40.0000 mg | ORAL_TABLET | Freq: Every day | ORAL | Status: DC
  Administered 2017-01-16 – 2017-01-17 (×2): 40 mg via ORAL
  Filled 2017-01-15 (×2): qty 1

## 2017-01-15 MED ORDER — HEPARIN SODIUM (PORCINE) 5000 UNIT/ML IJ SOLN
5000.0000 [IU] | Freq: Three times a day (TID) | INTRAMUSCULAR | Status: DC
  Administered 2017-01-15 – 2017-01-17 (×5): 5000 [IU] via SUBCUTANEOUS
  Filled 2017-01-15 (×5): qty 1

## 2017-01-15 MED ORDER — SODIUM CHLORIDE 0.9% FLUSH: 3.0000 mL | INTRAVENOUS | Status: DC | PRN

## 2017-01-15 MED ORDER — HYDRALAZINE HCL 25 MG PO TABS
25.0000 mg | ORAL_TABLET | Freq: Three times a day (TID) | ORAL | Status: DC
  Administered 2017-01-15 – 2017-01-17 (×5): 25 mg via ORAL
  Filled 2017-01-15 (×6): qty 1

## 2017-01-15 MED ORDER — ONDANSETRON HCL 4 MG/2ML IJ SOLN: 4.0000 mg | Freq: Four times a day (QID) | INTRAMUSCULAR | Status: DC | PRN

## 2017-01-15 MED ORDER — FUROSEMIDE 10 MG/ML IJ SOLN
40.0000 mg | Freq: Once | INTRAMUSCULAR | Status: AC
  Administered 2017-01-15: 40 mg via INTRAVENOUS
  Filled 2017-01-15: qty 4

## 2017-01-15 MED ORDER — CALCIUM CARBONATE ANTACID 500 MG PO CHEW
1.0000 | CHEWABLE_TABLET | Freq: Two times a day (BID) | ORAL | Status: DC
  Administered 2017-01-15 – 2017-01-17 (×4): 200 mg via ORAL
  Filled 2017-01-15 (×4): qty 1

## 2017-01-15 MED ORDER — LATANOPROST 0.005 % OP SOLN
1.0000 [drp] | Freq: Every day | OPHTHALMIC | Status: DC
  Administered 2017-01-15 – 2017-01-16 (×2): 1 [drp] via OPHTHALMIC
  Filled 2017-01-15: qty 2.5

## 2017-01-15 MED ORDER — ACETAMINOPHEN 325 MG PO TABS
650.0000 mg | ORAL_TABLET | ORAL | Status: DC | PRN
  Administered 2017-01-16: 650 mg via ORAL
  Filled 2017-01-15: qty 2

## 2017-01-15 NOTE — Progress Notes (Signed)
Refused bed alarm. Will continue to monitor patient. 

## 2017-01-15 NOTE — ED Triage Notes (Signed)
Pt arrives from home with complaints of SOB x 3 days. Pt wears 2lpm [place] at all times but reports he has had to increase to 4lpm. On arrival to ED pt spo2 76%. Pt placed on 6lpm [place] and spo2 increased to 82%. At this time 99% on 10lpm NRB. Pt denies chest pain or cough.

## 2017-01-15 NOTE — H&P (Signed)
History and Physical   Adam Dennis:[id number] DOB: Mar 08, 1961 DOA: 01/15/2017  Referring MD/NP/PA: Sherry Ruffing, EDP PCP: Boykin Nearing, MD Outpatient Specialists: Doylene Canard, cardiology  Patient coming from: Home  Chief Complaint: Shortness of breath  HPI: Adam Dennis is a 56 y.o. male with medical history significant for chronic HFrEF and chronic hypoxic respiratory failure who presented to the ED 8/22 for worsening dyspnea.   He reports 3 days of constant, worsening dyspnea mostly on exertion, he first noticed when walking into work. This is associated with orthopnea and increased leg swelling despite continuing his home diuretics.  ED Course: He presented with crackles on pulmonary exam and LE edema with increased oxygen requirement, down to 70's on 2L by nasal cannula improved to 92% on 6L. Briefly required nonrebreather. BNP up at 692, troponin negative, CXR with vascular congestion without infiltrate. His work of breathing improved with IV lasix, but hospitalists were called for admission due to worsening hypoxia due to acute CHF.   Review of Systems: No fever, cough, wheezing, chest pain, palpitations, PND, and per HPI. All others reviewed and are negative.   Past Medical History:  Diagnosis Date  . CHF (congestive heart failure) ([place])   . Chronic combined systolic and diastolic heart failure ([place])   . Hypertension   . Hypertrophy of tonsils alone   . Obesity, unspecified   . Obstructive sleep apnea (adult) (pediatric)    Past Surgical History:  Procedure Laterality Date  . UMBILICAL HERNIA REPAIR     - Never smoker, no EtOH or illicit drugs. Employed.   No Known Allergies Family History  Problem Relation Age of Onset  . Hypertension Mother   . Diabetes Brother   . Diabetes Sister   . Cancer Maternal Aunt    - Family history otherwise reviewed and not pertinent.  Prior to Admission medications   Medication Sig Start Date End Date Taking? Authorizing Provider    aspirin 325 MG tablet Take 325 mg by mouth daily.   Yes [provider]  atorvastatin (LIPITOR) 40 MG tablet Take 1 tablet (40 mg total) by mouth daily. 12/16/16  Yes Funches, Josalyn, MD  calcium carbonate (TUMS - DOSED IN MG ELEMENTAL CALCIUM) 500 MG chewable tablet Chew 1 tablet (200 mg of elemental calcium total) by mouth 2 (two) times daily. 11/27/14  Yes Dixie Dials, MD  carvedilol (COREG) 12.5 MG tablet Take 1 tablet (12.5 mg total) by mouth 2 (two) times daily. 12/22/16  Yes Funches, Josalyn, MD  furosemide (LASIX) 40 MG tablet Take 2 tablets (80 mg total) by mouth 2 (two) times daily. 12/22/16  Yes Funches, Josalyn, MD  hydrALAZINE (APRESOLINE) 50 MG tablet Take 0.5 tablets (25 mg total) by mouth 3 (three) times daily. 12/22/16  Yes Funches, Josalyn, MD  isosorbide mononitrate (IMDUR) 30 MG 24 hr tablet Take 1 tablet (30 mg total) by mouth daily. 12/22/16  Yes Funches, Josalyn, MD  lisinopril (PRINIVIL,ZESTRIL) 20 MG tablet Take 1 tablet (20 mg total) by mouth daily. 12/16/16  Yes Funches, Josalyn, MD  nitroGLYCERIN (NITROSTAT) 0.4 MG SL tablet Place 1 tablet (0.4 mg total) under the tongue every 5 (five) minutes x 3 doses as needed for chest pain. 12/17/12  Yes Dixie Dials, MD  potassium chloride (K-DUR) 10 MEQ tablet Take 10 mEq by mouth daily.   Yes [provider]  TRAVATAN Z 0.004 % SOLN ophthalmic solution Place 1 drop into both eyes at bedtime. 11/01/15  Yes [provider]    Physical  Exam: Vitals:   01/15/17 1645 01/15/17 1700 01/15/17 1715 01/15/17 1730  BP: (!) 156/87 (!) 161/98 (!) 147/72 (!) 158/85  Pulse: 62 (!) 51 (!) 59 (!) 54  Resp: (!) 22 (!) 22 19 (!) 21  Temp:      TempSrc:      SpO2: 98% 100% 100% 93%  Weight:      Height:       Constitutional: 56 y.o. male in no distress, calm demeanor Eyes: Lids and conjunctivae normal, PERRL ENMT: Mucous membranes are moist. Posterior pharynx clear of any exudate or lesions. Poor dentition.  Neck:  normal, supple, no masses, no thyromegaly Respiratory: Mildly labored on 6L by nasal cannula. Distant with bibasilar crackles, no wheezing. Cardiovascular: Regular borderline bradycardia, no murmurs, rubs, or gallops. No carotid bruits. Difficult to evaluate JVD. 1+ pitting LE edema. 2+ pedal pulses. Abdomen: Normoactive bowel sounds. No tenderness, non-distended, and no masses palpated. No hepatosplenomegaly. GU: No indwelling catheter Musculoskeletal: No clubbing / cyanosis. No joint deformity upper and lower extremities. Good ROM, no contractures. Normal muscle tone.  Skin: Warm, dry. No rashes, wounds, no ulcers. No significant lesions noted.  Neurologic: CN II-XII grossly intact. Gait steady. Speech normal. No focal deficits in motor strength or sensation in all extremities.  Psychiatric: Alert and oriented x3. Normal judgment and insight. Mood euthymic with congruent affect.   Labs on Admission: I have personally reviewed following labs and imaging studies  CBC:  Recent Labs Lab 01/15/17 1324  WBC 6.8  HGB 12.8*  HCT 41.7  MCV 84.9  PLT 161   Basic Metabolic Panel:  Recent Labs Lab 01/15/17 1324  NA 141  K 4.7  CL 106  CO2 31  GLUCOSE 138*  BUN 28*  CREATININE 2.74*  CALCIUM 8.2*   GFR: Estimated Creatinine Clearance: 47.3 mL/min (A) (by C-G formula based on SCr of 2.74 mg/dL (H)).  Urine analysis:    Component Value Date/Time   COLORURINE YELLOW 01/15/2017 1625   APPEARANCEUR CLEAR 01/15/2017 1625   LABSPEC 1.010 01/15/2017 1625   PHURINE 6.0 01/15/2017 1625   GLUCOSEU NEGATIVE 01/15/2017 1625   HGBUR SMALL (A) 01/15/2017 1625   BILIRUBINUR NEGATIVE 01/15/2017 1625   BILIRUBINUR negative 12/16/2016 1019   KETONESUR NEGATIVE 01/15/2017 1625   PROTEINUR 100 (A) 01/15/2017 1625   UROBILINOGEN 1.0 12/16/2016 1019   UROBILINOGEN 1.0 10/09/2014 0900   NITRITE NEGATIVE 01/15/2017 1625   LEUKOCYTESUR NEGATIVE 01/15/2017 1625   Radiological Exams on  Admission: Dg Chest 2 View  Result Date: 01/15/2017 CLINICAL DATA:  Increasing shortness of breath. EXAM: CHEST  2 VIEW COMPARISON:  12/16/2016, 09/19/2016 and 12/11/2015 FINDINGS: There is chronic elevation of the left hemidiaphragm with shift of the heart to the right chronically. Chronic prominence of the right main pulmonary artery. Pulmonary vascularity is at the upper limits of normal. No acute infiltrates or effusions. IMPRESSION: No acute abnormality. Pulmonary vascularity is at the upper limits of normal. Electronically Signed   By: Lorriane Shire M.D.   On: 01/15/2017 14:03   EKG: Independently reviewed. NSR with RAD, vent rate 72.    Assessment/Plan Principal Problem:   Acute on chronic systolic (congestive) heart failure (HCC) Active Problems:   Hyperlipidemia   Morbid obesity (HCC)   Obstructive sleep apnea   Essential hypertension, benign   Diabetes mellitus type 2 in obese (HCC)   Obesity hypoventilation syndrome (HCC)   CKD (chronic kidney disease) stage 4, GFR 15-29 ml/min (HCC)   Glaucoma of right eye  Acute on chronic respiratory failure with hypoxia (HCC)   Acute on chronic respiratory failure: Due to CHF, OHS, OSA. - Continue Tx HF exacerbation below - Continue supplemental oxygen as needed for SpO2 maintain >90% - CPAP qHS ordered per home use  Acute on chronic systolic CHF: Recent EF 35%, stable from priors. Unclear EDW, was 158kg at discharge from CHF exacerbation earlier this year (4/26-4/29).  - Discussed with pt's cardiologist, Dr. Doylene Canard who will see the patient 8/23.  - Lasix 80mg  IV BID - Continue home coreg, hydralazine/imdur, lisinopril (BPs up in ED, no AKI) - Daily weights, I/O  CAD: No chest pain, troponin negative. ECG nonischemic.  - Continue lipitor, aspirin, beta blocker and prn NTG.   Stage IV CKD: SCr 2.7 at baseline and on admission.   - Continue ACE (no AKI), calcium.  - Monitor mild anemia of chronic disease  T2DM:  Diet-controlled - Carb-modified diet - Monitor AM glucose with BMP.   Glaucoma: Chronic, stable.  - Continue gtt's per formulary  Essential HTN:  - Continue home medications as above  DVT prophylaxis: Heparin  Code Status: Full  Family Communication: None at bedside Disposition Plan: Anticipate a few days of diuresis, discharge back to home when improved.  Consults called: Cardiology, Dr. Doylene Canard  Admission status: Inpatient    Vance Gather, MD [facility] Pager [phone]  If 7PM-7AM, please contact night-coverage [email or web address] Password [facility] 01/15/2017, 5:41 PM

## 2017-01-15 NOTE — ED Provider Notes (Signed)
[facility] DEPT Provider Note   CSN: [id number] Arrival date & time: 01/15/17  1254     History   Chief Complaint Chief Complaint  Patient presents with  . Shortness of Breath    HPI Adam Dennis is a 56 y.o. male.  The history is provided by the patient and medical records.  Shortness of Breath  This is a recurrent problem. The average episode lasts 3 days. The problem occurs continuously.The current episode started 2 days ago. The problem has been gradually worsening. Associated symptoms include leg swelling. Pertinent negatives include no fever, no rhinorrhea, no neck pain, no cough, no sputum production, no wheezing, no chest pain, no syncope, no vomiting, no abdominal pain, no rash and no leg pain. He has tried nothing for the symptoms. The treatment provided no relief. He has had prior hospitalizations. Associated medical issues include heart failure.    Past Medical History:  Diagnosis Date  . CHF (congestive heart failure) ([place])   . Chronic combined systolic and diastolic heart failure ([place])   . Hypertension   . Hypertrophy of tonsils alone   . Obesity, unspecified   . Obstructive sleep apnea (adult) (pediatric)     Patient Active Problem List   Diagnosis Date Noted  . Right flank pain 12/22/2016  . Vitamin D insufficiency 12/03/2015  . Glaucoma of right eye 11/30/2015  . CKD (chronic kidney disease) stage 4, GFR 15-29 ml/min (HCC) 10/04/2015  . Erectile dysfunction 06/26/2015  . O2 dependent 04/05/2015  . Diabetic nephropathy associated with type 2 diabetes mellitus ([place]) 02/13/2015  . Diabetes mellitus type 2 in obese ([place]) 02/10/2015  . Chronic combined systolic and diastolic CHF (congestive heart failure) ([place]) 02/10/2015  . Depression 02/10/2015  . Tinea pedis 02/10/2015  . Onychomycosis 02/10/2015  . Obesity hypoventilation syndrome ([place]) 02/10/2015  . Essential hypertension, benign 08/16/2013  . Dry skin 02/26/2013  . Hyperlipidemia 11/14/2008    . Morbid obesity ([place]) 03/10/2007  . Obstructive sleep apnea 03/10/2007  . HYPERTROPHY, TONSILS 03/10/2007    Past Surgical History:  Procedure Laterality Date  . UMBILICAL HERNIA REPAIR         Home Medications    Prior to Admission medications   Medication Sig Start Date End Date Taking? Authorizing Provider  aspirin 325 MG tablet Take 325 mg by mouth daily.    [provider]  atorvastatin (LIPITOR) 40 MG tablet Take 1 tablet (40 mg total) by mouth daily. 12/16/16   Funches, Adriana Mccallum, MD  calcium carbonate (TUMS - DOSED IN MG ELEMENTAL CALCIUM) 500 MG chewable tablet Chew 1 tablet (200 mg of elemental calcium total) by mouth 2 (two) times daily. 11/27/14   Dixie Dials, MD  carvedilol (COREG) 12.5 MG tablet Take 1 tablet (12.5 mg total) by mouth 2 (two) times daily. 12/22/16   Funches, Adriana Mccallum, MD  furosemide (LASIX) 40 MG tablet Take 2 tablets (80 mg total) by mouth 2 (two) times daily. 12/22/16   Funches, Adriana Mccallum, MD  hydrALAZINE (APRESOLINE) 50 MG tablet Take 0.5 tablets (25 mg total) by mouth 3 (three) times daily. 12/22/16   Funches, Adriana Mccallum, MD  isosorbide mononitrate (IMDUR) 30 MG 24 hr tablet Take 1 tablet (30 mg total) by mouth daily. 12/22/16   Funches, Adriana Mccallum, MD  lisinopril (PRINIVIL,ZESTRIL) 20 MG tablet Take 1 tablet (20 mg total) by mouth daily. 12/16/16   Funches, Adriana Mccallum, MD  nitroGLYCERIN (NITROSTAT) 0.4 MG SL tablet Place 1 tablet (0.4 mg total) under the tongue every 5 (five) minutes x  3 doses as needed for chest pain. 12/17/12   Dixie Dials, MD  potassium chloride (K-DUR) 10 MEQ tablet Take 10 mEq by mouth daily.    [provider]  TRAVATAN Z 0.004 % SOLN ophthalmic solution Place 1 drop into both eyes at bedtime. 11/01/15   [provider]  Vitamin D, Cholecalciferol, 400 units CAPS Take 400 Units by mouth daily.    [provider]    Family History Family History  Problem Relation Age of Onset  . Hypertension Mother   .  Diabetes Brother   . Diabetes Sister   . Cancer Maternal Aunt     Social History Social History  Substance Use Topics  . Smoking status: Never Smoker  . Smokeless tobacco: Never Used  . Alcohol use No     Allergies   Patient has no known allergies.   Review of Systems Review of Systems  Constitutional: Negative for chills, diaphoresis, fatigue and fever.  HENT: Negative for congestion and rhinorrhea.   Eyes: Negative for visual disturbance.  Respiratory: Positive for shortness of breath. Negative for cough, sputum production, chest tightness and wheezing.   Cardiovascular: Positive for leg swelling. Negative for chest pain and syncope.  Gastrointestinal: Negative for abdominal pain, constipation, diarrhea, nausea and vomiting.  Genitourinary: Negative for dysuria and flank pain.  Musculoskeletal: Negative for back pain, neck pain and neck stiffness.  Skin: Negative for rash and wound.  Neurological: Negative for light-headedness.  Psychiatric/Behavioral: Negative for confusion.  All other systems reviewed and are negative.    Physical Exam Updated Vital Signs BP (!) 153/89 (BP Location: Left Arm)   Pulse 71   Temp 98.2 F (36.8 C) (Oral)   Resp (!) 22   Ht 5' 11.75" (1.822 m)   Wt (!) 158.8 kg (350 lb)   SpO2 (!) 80%   BMI 47.80 kg/m   Physical Exam  Constitutional: He is oriented to person, place, and time. He appears well-developed and well-nourished. No distress.  HENT:  Head: Normocephalic and atraumatic.  Mouth/Throat: Oropharynx is clear and moist. No oropharyngeal exudate.  Eyes: Conjunctivae and EOM are normal.  Neck: Normal range of motion. Neck supple.  Cardiovascular: Normal rate and intact distal pulses.   No murmur heard. Pulmonary/Chest: Effort normal. No stridor. Tachypnea noted. No respiratory distress. He has no wheezes. He has no rhonchi. He has rales. He exhibits no tenderness.  Abdominal: Soft. There is no tenderness.  Musculoskeletal: He  exhibits edema. He exhibits no tenderness.  Neurological: He is alert and oriented to person, place, and time. He exhibits normal muscle tone.  Skin: Skin is warm and dry. Capillary refill takes less than 2 seconds. He is not diaphoretic. No erythema. No pallor.  Psychiatric: He has a normal mood and affect.  Nursing note and vitals reviewed.    ED Treatments / Results  Labs (all labs ordered are listed, but only abnormal results are displayed) Labs Reviewed  BASIC METABOLIC PANEL - Abnormal; Notable for the following:       Result Value   Glucose, Bld 138 (*)    BUN 28 (*)    Creatinine, Ser 2.74 (*)    Calcium 8.2 (*)    GFR calc non Af Amer 24 (*)    GFR calc Af Amer 28 (*)    Anion gap 4 (*)    All other components within normal limits  CBC - Abnormal; Notable for the following:    Hemoglobin 12.8 (*)    RDW  17.9 (*)    All other components within normal limits  BRAIN NATRIURETIC PEPTIDE - Abnormal; Notable for the following:    B Natriuretic Peptide 692.5 (*)    All other components within normal limits  URINALYSIS, ROUTINE W REFLEX MICROSCOPIC - Abnormal; Notable for the following:    Hgb urine dipstick SMALL (*)    Protein, ur 100 (*)    Squamous Epithelial / LPF 0-5 (*)    All other components within normal limits  I-STAT TROPONIN, ED    EKG  EKG Interpretation  Date/Time:  Wednesday January 15 2017 13:15:13 EDT Ventricular Rate:  72 PR Interval:  158 QRS Duration: 88 QT Interval:  394 QTC Calculation: 431 R Axis:   131 Text Interpretation:  Sinus rhythm with Premature atrial complexes in a pattern of bigeminy Right axis deviation Abnormal ECG When comapred to prior, no significant change seen.  No STEMI Confirmed by Antony Blackbird [phone]) on 01/15/2017 3:19:34 PM       Radiology Dg Chest 2 View  Result Date: 01/15/2017 CLINICAL DATA:  Increasing shortness of breath. EXAM: CHEST  2 VIEW COMPARISON:  12/16/2016, 09/19/2016 and 12/11/2015 FINDINGS: There is  chronic elevation of the left hemidiaphragm with shift of the heart to the right chronically. Chronic prominence of the right main pulmonary artery. Pulmonary vascularity is at the upper limits of normal. No acute infiltrates or effusions. IMPRESSION: No acute abnormality. Pulmonary vascularity is at the upper limits of normal. Electronically Signed   By: Lorriane Shire M.D.   On: 01/15/2017 14:03    Procedures Procedures (including critical care time)  Medications Ordered in ED Medications  furosemide (LASIX) injection 40 mg (40 mg Intravenous Given 01/15/17 1615)     Initial Impression / Assessment and Plan / ED Course  I have reviewed the triage vital signs and the nursing notes.  Pertinent labs & imaging results that were available during my care of the patient were reviewed by me and considered in my medical decision making (see chart for details).     Adam Dennis is a 56 y.o. male with a past medical history significant for CHF and hypertension on 2 L nasal cannula at baseline who presents with fatigue, worsening shortness of breath, and increased oxygen requirement. Patient was found to have oxygen saturations in the 70s and 80s by EMS today. Patient arrived with a nonrebreather on 10 L. Patient has been escalated to approximately 6 L nasal cannula was continued to have some shortness of breath. Patient denies fevers, chills, chest pain, nausea, vomiting, conservation, or diarrhea. He does report some mild urinary frequency. He does report a dry cough recently. Patient says his legs are always swollen and have not changed. He has not changed medications recently has been taking his Lasix as directed. Next  On exam, patient has crackles in both lungs. Patient has nontender chest. Abdomen nontender. Patient has edema in both legs. No focal neurologic deficits. Our saturation is partially 92% on 6 L.  Laboratory testing and imaging revealed evidence of fluid overload and CHF  exacerbation. Reading similar to prior at 2.74. BNP elevated at 692 up from prior. Troponin negative. No leukocytosis, mild anemia. Urinalysis still has not been finalized.   Chest x-ray shows no evidence of pneumonia but does show evidence of increased pulmonary vascularity. On my review it does look like there are someareas of pulmonary edema. Next  Due to the patient's increased Dr. Asencion Partridge, continued shortness of breath, and clinical evidence of fluid  overload, patient will be given Lasix and admitted for CHF exacerbation and diuresis.      Final Clinical Impressions(s) / ED Diagnoses   Final diagnoses:  Acute on chronic congestive heart failure, unspecified heart failure type (HCC)  Hypoxia  Shortness of breath     Clinical Impression: 1. Acute on chronic congestive heart failure, unspecified heart failure type ([place])   2. Hypoxia   3. Shortness of breath     Disposition: Admit to Hospitalist service    Lutie Pickler, Gwenyth Allegra, MD 01/15/17 [phone]

## 2017-01-15 NOTE — ED Notes (Signed)
Spoke with lab will return call if unable to add on to sample sent down previously

## 2017-01-16 ENCOUNTER — Encounter (HOSPITAL_COMMUNITY): Payer: Self-pay | Admitting: General Practice

## 2017-01-16 LAB — BASIC METABOLIC PANEL
ANION GAP: 9 (ref 5–15)
BUN: 28 mg/dL — AB (ref 6–20)
CO2: 30 mmol/L (ref 22–32)
Calcium: 8.1 mg/dL — ABNORMAL LOW (ref 8.9–10.3)
Chloride: 103 mmol/L (ref 101–111)
Creatinine, Ser: 2.71 mg/dL — ABNORMAL HIGH (ref 0.61–1.24)
GFR, EST AFRICAN AMERICAN: 29 mL/min — AB (ref >60)
GFR, EST NON AFRICAN AMERICAN: 25 mL/min — AB (ref >60)
Glucose, Bld: 90 mg/dL (ref 65–99)
POTASSIUM: 5 mmol/L (ref 3.5–5.1)
Sodium: 142 mmol/L (ref 135–145)

## 2017-01-16 MED ORDER — SODIUM CHLORIDE 0.9 % IV SOLN: 250.0000 mL | INTRAVENOUS | Status: DC | PRN

## 2017-01-16 NOTE — Progress Notes (Signed)
Pt educated about safety and importance of bed alarm during the night however pt refuses to be on bed alarm. Will continue to round on patient.   Leylah Tarnow, RN    

## 2017-01-16 NOTE — Progress Notes (Signed)
PROGRESS NOTE  Adam Dennis  YQM:[id number] DOB: [birth year] DOA: 01/15/2017 PCP: Boykin Nearing, MD  Outpatient Specialists: Cardiology, Doylene Canard Brief Narrative: Adam Dennis is a 56 y.o. male with a history of chronic HFrEF, OSA and chronic hypoxic respiratory failure who presented to the ED 8/22 for 3 days of worsening dyspnea and associated leg swelling and orthopnea. He presented with crackles on pulmonary exam and LE edema with increased oxygen requirement, down to 70's on 2L by nasal cannula improved to 92% on 6L. Briefly required nonrebreather. BNP up at 692, troponin negative, CXR with vascular congestion without infiltrate. His work of breathing improved with IV lasix, but hospitalists were called for admission due to worsening hypoxia due to acute CHF. Diuresis has been continued and symptoms are mildly improved without return to baseline respiratory status/oxygen requirement.   Assessment & Plan: Principal Problem:   Acute on chronic systolic (congestive) heart failure (HCC) Active Problems:   Hyperlipidemia   Morbid obesity (HCC)   Obstructive sleep apnea   Essential hypertension, benign   Diabetes mellitus type 2 in obese (HCC)   Obesity hypoventilation syndrome (HCC)   CKD (chronic kidney disease) stage 4, GFR 15-29 ml/min (HCC)   Glaucoma of right eye   Acute on chronic respiratory failure with hypoxia (HCC)  Acute on chronic respiratory failure: Due to CHF, OHS, OSA. - Continue Tx HF exacerbation below - Continue supplemental oxygen as needed for SpO2 maintain >90%. Hopeful to wean to home 2L with diuresis.  OSA: Chronic, stable. - CPAP qHS continued  Acute on chronic systolic CHF: Recent EF 88%, stable from priors. Unclear EDW, was 158kg at discharge from CHF exacerbation earlier this year (4/26-4/29). Weights here improving 164kg > 162kg.  - Dr. Doylene Canard to see today - Lasix 80mg  IV BID, creatinine holding steady.  - Continue home coreg, hydralazine/imdur,  lisinopril (BPs up in ED, no AKI) - Daily weights, I/O  CAD: No chest pain, troponin negative. ECG nonischemic.  - Continue lipitor, aspirin, beta blocker and prn NTG.   Stage IV CKD: SCr 2.7 at baseline and on admission.   - Continue ACE (no AKI), calcium.  - Monitor mild anemia of chronic disease  T2DM: Diet-controlled, recent HbA1c 6.2%.  - Carb-modified diet - Monitor AM glucose with BMP.   Glaucoma: Chronic, stable.  - Continue gtt's per formulary  Essential HTN:  - Continue home medications as above  DVT prophylaxis: Lovenox Code Status: Full Family Communication: None at bedside, declined offer to call.  Disposition Plan: Continued IV diuresis. Anticipate DC to home in next 24-48 hrs.   Consultants:   Cardiology, Doylene Canard  Procedures:   None  Antimicrobials:  None   Subjective: Feeling better modestly, leg swelling somewhat improved but dyspnea is still worse than baseline, worse with exertion.   Objective: Vitals:   01/15/17 1900 01/15/17 2116 01/16/17 0820 01/16/17 0900  BP:  (!) 166/97 (!) 146/79   Pulse:  65 82   Resp:  20 (!) 22   Temp:  98.2 F (36.8 C) 98 F (36.7 C)   TempSrc:  Oral Oral   SpO2:  92% 94%   Weight: (!) 164.5 kg (362 lb 9.6 oz)   (!) 162.1 kg (357 lb 4.8 oz)  Height: 5\' 11"  (1.803 m)       Intake/Output Summary (Last 24 hours) at 01/16/17 1231 Last data filed at 01/16/17 1015  Gross per 24 hour  Intake  702 ml  Output             1800 ml  Net            -1098 ml   Filed Weights   01/15/17 1314 01/15/17 1900 01/16/17 0900  Weight: (!) 158.8 kg (350 lb) (!) 164.5 kg (362 lb 9.6 oz) (!) 162.1 kg (357 lb 4.8 oz)    Examination: General exam: Obese, pleasant 56 y.o. male in no distress Respiratory system: Mildly labored on 6L by [place]. Diminished, scant crackles at bases.  Cardiovascular system: Regular rate and rhythm. No murmur, rub, or gallop. Uncertain JVD, and 1+ pedal edema. Gastrointestinal system:  Abdomen obese, soft, non-tender, non-distended, with normoactive bowel sounds. No organomegaly or masses felt. Central nervous system: Alert and oriented. No focal neurological deficits. Extremities: Warm, no deformities Skin: No rashes, lesions no ulcers Psychiatry: Judgement and insight appear normal. Mood & affect appropriate.   Data Reviewed: I have personally reviewed following labs and imaging studies  CBC:  Recent Labs Lab 01/15/17 1324  WBC 6.8  HGB 12.8*  HCT 41.7  MCV 84.9  PLT 366   Basic Metabolic Panel:  Recent Labs Lab 01/15/17 1324 01/16/17 0416  NA 141 142  K 4.7 5.0  CL 106 103  CO2 31 30  GLUCOSE 138* 90  BUN 28* 28*  CREATININE 2.74* 2.71*  CALCIUM 8.2* 8.1*   GFR: Estimated Creatinine Clearance: 47.9 mL/min (A) (by C-G formula based on SCr of 2.71 mg/dL (H)). Liver Function Tests: No results for input(s): AST, ALT, ALKPHOS, BILITOT, PROT, ALBUMIN in the last 168 hours. No results for input(s): LIPASE, AMYLASE in the last 168 hours. No results for input(s): AMMONIA in the last 168 hours. Coagulation Profile: No results for input(s): INR, PROTIME in the last 168 hours. Cardiac Enzymes: No results for input(s): CKTOTAL, CKMB, CKMBINDEX, TROPONINI in the last 168 hours. BNP (last 3 results) No results for input(s): PROBNP in the last 8760 hours. HbA1C: No results for input(s): HGBA1C in the last 72 hours. CBG: No results for input(s): GLUCAP in the last 168 hours. Lipid Profile: No results for input(s): CHOL, HDL, LDLCALC, TRIG, CHOLHDL, LDLDIRECT in the last 72 hours. Thyroid Function Tests: No results for input(s): TSH, T4TOTAL, FREET4, T3FREE, THYROIDAB in the last 72 hours. Anemia Panel: No results for input(s): VITAMINB12, FOLATE, FERRITIN, TIBC, IRON, RETICCTPCT in the last 72 hours. Urine analysis:    Component Value Date/Time   COLORURINE YELLOW 01/15/2017 1625   APPEARANCEUR CLEAR 01/15/2017 1625   LABSPEC 1.010 01/15/2017 1625    PHURINE 6.0 01/15/2017 1625   GLUCOSEU NEGATIVE 01/15/2017 1625   HGBUR SMALL (A) 01/15/2017 1625   BILIRUBINUR NEGATIVE 01/15/2017 1625   BILIRUBINUR negative 12/16/2016 1019   KETONESUR NEGATIVE 01/15/2017 1625   PROTEINUR 100 (A) 01/15/2017 1625   UROBILINOGEN 1.0 12/16/2016 1019   UROBILINOGEN 1.0 10/09/2014 0900   NITRITE NEGATIVE 01/15/2017 1625   LEUKOCYTESUR NEGATIVE 01/15/2017 1625   No results found for this or any previous visit (from the past 240 hour(s)).    Radiology Studies: Dg Chest 2 View  Result Date: 01/15/2017 CLINICAL DATA:  Increasing shortness of breath. EXAM: CHEST  2 VIEW COMPARISON:  12/16/2016, 09/19/2016 and 12/11/2015 FINDINGS: There is chronic elevation of the left hemidiaphragm with shift of the heart to the right chronically. Chronic prominence of the right main pulmonary artery. Pulmonary vascularity is at the upper limits of normal. No acute infiltrates or effusions. IMPRESSION: No acute abnormality. Pulmonary vascularity  is at the upper limits of normal. Electronically Signed   By: Lorriane Shire M.D.   On: 01/15/2017 14:03    Scheduled Meds: . aspirin  325 mg Oral Daily  . atorvastatin  40 mg Oral Daily  . calcium carbonate  1 tablet Oral BID  . carvedilol  12.5 mg Oral BID  . furosemide  80 mg Intravenous BID  . heparin  5,000 Units Subcutaneous Q8H  . hydrALAZINE  25 mg Oral TID  . isosorbide mononitrate  30 mg Oral Daily  . latanoprost  1 drop Both Eyes QHS  . lisinopril  20 mg Oral Daily  . sodium chloride flush  3 mL Intravenous Q12H   Continuous Infusions: . sodium chloride       LOS: 1 day   Time spent: 25 minutes.  Vance Gather, MD [facility] Pager [phone]  If 7PM-7AM, please contact night-coverage [email or web address] Password [facility] 01/16/2017, 12:31 PM

## 2017-01-16 NOTE — Care Management Note (Signed)
Case Management Note  Patient Details  Name: BHAVIK CABINESS MRN: [id number] Date of Birth: 01/09/1961  Subjective/Objective:       CHF            Action/Plan: Patient lives at home; PCP: [name], MD; has private insurance with Medicaid with prescription drug coverage; DME- home 02, CPAP machine; CM following for DCP.  Expected Discharge Date:    possibly 01/19/2017              Expected Discharge Plan:  Home/Self Care  Discharge planning Services  CM Consult  Status of Service:  In process, will continue to follow  Sherrilyn Rist [phone] 01/16/2017, 10:28 AM

## 2017-01-17 LAB — BASIC METABOLIC PANEL
Anion gap: 6 (ref 5–15)
BUN: 28 mg/dL — ABNORMAL HIGH (ref 6–20)
CALCIUM: 8.3 mg/dL — AB (ref 8.9–10.3)
CO2: 36 mmol/L — ABNORMAL HIGH (ref 22–32)
CREATININE: 2.76 mg/dL — AB (ref 0.61–1.24)
Chloride: 101 mmol/L (ref 101–111)
GFR, EST AFRICAN AMERICAN: 28 mL/min — AB (ref >60)
GFR, EST NON AFRICAN AMERICAN: 24 mL/min — AB (ref >60)
Glucose, Bld: 108 mg/dL — ABNORMAL HIGH (ref 65–99)
Potassium: 5.1 mmol/L (ref 3.5–5.1)
SODIUM: 143 mmol/L (ref 135–145)

## 2017-01-17 MED ORDER — ORAL CARE MOUTH RINSE
15.0000 mL | Freq: Two times a day (BID) | OROMUCOSAL | Status: DC
  Administered 2017-01-17: 15 mL via OROMUCOSAL

## 2017-01-17 NOTE — Discharge Summary (Signed)
Physician Discharge Summary  [name]:[id number] DOB: Oct 10, 1960 DOA: 01/15/2017  PCP: Boykin Nearing, MD  Admit date: 01/15/2017 Discharge date: 01/17/2017  Admitted From: Home Disposition: Home   Recommendations for Outpatient Follow-up:  1. Follow up with PCP and cardiology in the next 1 - 2 weeks.  2. Monitor BMP and volume status.   Home Health: None Equipment/Devices: None Discharge Condition: Stable CODE STATUS: Full Diet recommendation: Heart healthy, carbohydrate-limited.   Brief/Interim Summary: Adam Dennis a 56 y.o.malewith a history of chronic HFrEF, OSA and chronic hypoxic respiratory failure who presented to the ED 8/22 for 3 days of worsening dyspnea and associated leg swelling and orthopnea. He admitted to dietary indiscretions including high salt intake. He presented with crackles on pulmonary exam and LE edema with increased oxygen requirement, down to 70's on 2L by nasal cannula improved to 92% on 6L. Briefly required nonrebreather. BNP up at 692, troponin negative, CXR with vascular congestion without infiltrate. His work of breathing improved with IV lasix, but hospitalists were called for admission due to worsening hypoxia due to acute CHF. Nutrition was consulted for education regarding heart healthy selections and low sodium choices. Diuresis has been continued and symptoms improved with return to baseline respiratory status, 100% on 2L by nasal cannula.  Discharge Diagnoses:  Principal Problem:   Acute on chronic systolic (congestive) heart failure (HCC) Active Problems:   Hyperlipidemia   Morbid obesity (HCC)   Obstructive sleep apnea   Essential hypertension, benign   Diabetes mellitus type 2 in obese (HCC)   Obesity hypoventilation syndrome (HCC)   CKD (chronic kidney disease) stage 4, GFR 15-29 ml/min (HCC)   Glaucoma of right eye   Acute on chronic respiratory failure with hypoxia (HCC)  Acute on chronic respiratory failure: Due to  CHF, OHS, OSA. - Continue Tx HF exacerbation below - Continue supplemental oxygen as needed for SpO2 maintain >90%. Has returned to home 2L with respiratory status at baseline.   OSA: Chronic, stable. - CPAP qHS continued  Acute on chronic systolic CHF: Recent EF 41%, stable from priors. Unclear EDW, was 158kg at discharge from CHF exacerbation earlier this year (4/26-4/29). Weights here improving 164kg > 162kg.  - Was on lasix 80mg  IV BID, creatinine holding steady with bicarb trending up to 36, suspect contraction alkalosis. Will restart home dose lasix, but may require upward titration on dose due to renal failure.  - Emphasized limitation of salt, nutrition consulted.  - Continue home coreg, hydralazine/imdur, lisinopril  - Daily weights, I/O - Follow up with Dr. Doylene Canard in the next week.   CAD: No chest pain, troponin negative. ECG nonischemic.  - Continue lipitor, aspirin, beta blocker and prn NTG.   Stage IV CKD: SCr 2.7 at baseline on admission and remained stable.  - Continue ACE (no AKI), calcium.  - Monitor mild anemia of chronic disease  T2DM: Diet-controlled, recent HbA1c 6.2%.  - Carb-modified diet  Glaucoma: Chronic, stable.  - Continue gtt's per formulary  Essential HTN:  - Continue home medications as above  Discharge Instructions Discharge Instructions    (HEART FAILURE PATIENTS) Call MD:  Anytime you have any of the following symptoms: 1) 3 pound weight gain in 24 hours or 5 pounds in 1 week 2) shortness of breath, with or without a dry hacking cough 3) swelling in the hands, feet or stomach 4) if you have to sleep on extra pillows at night in order to breathe.    Complete by:  As directed  Diet - low sodium heart healthy    Complete by:  As directed    Discharge instructions    Complete by:  As directed    You were admitted for an exacerbation of heart failure which has improved with IV diuretics.  - Continue taking medications as you were  -  Avoid salt as much as possible - Schedule an appointment with Dr. Doylene Canard and/or Dr. Adrian Blackwater in the next week for hospital follow up.   Increase activity slowly    Complete by:  As directed      Allergies as of 01/17/2017   No Known Allergies     Medication List    TAKE these medications   aspirin 325 MG tablet Take 325 mg by mouth daily.   atorvastatin 40 MG tablet Commonly known as:  LIPITOR Take 1 tablet (40 mg total) by mouth daily.   calcium carbonate 500 MG chewable tablet Commonly known as:  TUMS - dosed in mg elemental calcium Chew 1 tablet (200 mg of elemental calcium total) by mouth 2 (two) times daily.   carvedilol 12.5 MG tablet Commonly known as:  COREG Take 1 tablet (12.5 mg total) by mouth 2 (two) times daily.   furosemide 40 MG tablet Commonly known as:  LASIX Take 2 tablets (80 mg total) by mouth 2 (two) times daily.   hydrALAZINE 50 MG tablet Commonly known as:  APRESOLINE Take 0.5 tablets (25 mg total) by mouth 3 (three) times daily.   isosorbide mononitrate 30 MG 24 hr tablet Commonly known as:  IMDUR Take 1 tablet (30 mg total) by mouth daily.   lisinopril 20 MG tablet Commonly known as:  PRINIVIL,ZESTRIL Take 1 tablet (20 mg total) by mouth daily.   nitroGLYCERIN 0.4 MG SL tablet Commonly known as:  NITROSTAT Place 1 tablet (0.4 mg total) under the tongue every 5 (five) minutes x 3 doses as needed for chest pain.   potassium chloride 10 MEQ tablet Commonly known as:  K-DUR Take 10 mEq by mouth daily.   TRAVATAN Z 0.004 % Soln ophthalmic solution Generic drug:  Travoprost (BAK Free) Place 1 drop into both eyes at bedtime.            Discharge Care Instructions        Start     Ordered   01/17/17 0000  Increase activity slowly     01/17/17 1233   01/17/17 0000  Diet - low sodium heart healthy     01/17/17 1233   01/17/17 0000  (HEART FAILURE PATIENTS) Call MD:  Anytime you have any of the following symptoms: 1) 3 pound weight  gain in 24 hours or 5 pounds in 1 week 2) shortness of breath, with or without a dry hacking cough 3) swelling in the hands, feet or stomach 4) if you have to sleep on extra pillows at night in order to breathe.     01/17/17 1233   01/17/17 0000  Discharge instructions    Comments:  You were admitted for an exacerbation of heart failure which has improved with IV diuretics.  - Continue taking medications as you were  - Avoid salt as much as possible - Schedule an appointment with Dr. Doylene Canard and/or Dr. Adrian Blackwater in the next week for hospital follow up.   01/17/17 1233     Follow-up Information    Boykin Nearing, MD Follow up.   Specialty:  Family Medicine Contact information: [place] [address] [phone]  Dixie Dials, MD. Schedule an appointment as soon as possible for a visit in 1 week(s).   Specialty:  Cardiology Contact information: [place] [address] [phone]          No Known Allergies  Consultations:  Cardiology, Dr. Doylene Canard  Procedures/Studies: Dg Chest 2 View  Result Date: 01/15/2017 CLINICAL DATA:  Increasing shortness of breath. EXAM: CHEST  2 VIEW COMPARISON:  12/16/2016, 09/19/2016 and 12/11/2015 FINDINGS: There is chronic elevation of the left hemidiaphragm with shift of the heart to the right chronically. Chronic prominence of the right main pulmonary artery. Pulmonary vascularity is at the upper limits of normal. No acute infiltrates or effusions. IMPRESSION: No acute abnormality. Pulmonary vascularity is at the upper limits of normal. Electronically Signed   By: Lorriane Shire M.D.   On: 01/15/2017 14:03   Subjective: Breathing is back to baseline. No chest pain. Swelling improved.   Discharge Exam: BP 136/69   Pulse 60   Temp 97.9 F (36.6 C) (Oral)   Resp 20   Ht 5\' 11"  (1.803 m)   Wt (!) 162.7 kg (358 lb 9.6 oz)   SpO2 100%   BMI 50.01 kg/m   General: Pt is alert, awake, not in  acute distress Cardiovascular: RRR, faint early systolic murmur, no JVD. Pedal edema has resolved, skin appears shriveled.  Respiratory: Nonlabored ambulating, no crackles or wheezes  Labs: BNP (last 3 results)  Recent Labs  09/19/16 0942 09/19/16 1306 01/15/17 1530  BNP 228.6* 247.9* 681.1*   Basic Metabolic Panel:  Recent Labs Lab 01/15/17 1324 01/16/17 0416 01/17/17 0646  NA 141 142 143  K 4.7 5.0 5.1  CL 106 103 101  CO2 31 30 36*  GLUCOSE 138* 90 108*  BUN 28* 28* 28*  CREATININE 2.74* 2.71* 2.76*  CALCIUM 8.2* 8.1* 8.3*   CBC:  Recent Labs Lab 01/15/17 1324  WBC 6.8  HGB 12.8*  HCT 41.7  MCV 84.9  PLT 210   Urinalysis    Component Value Date/Time   COLORURINE YELLOW 01/15/2017 1625   APPEARANCEUR CLEAR 01/15/2017 1625   LABSPEC 1.010 01/15/2017 1625   PHURINE 6.0 01/15/2017 1625   GLUCOSEU NEGATIVE 01/15/2017 1625   HGBUR SMALL (A) 01/15/2017 1625   BILIRUBINUR NEGATIVE 01/15/2017 1625   BILIRUBINUR negative 12/16/2016 1019   KETONESUR NEGATIVE 01/15/2017 1625   PROTEINUR 100 (A) 01/15/2017 1625   UROBILINOGEN 1.0 12/16/2016 1019   UROBILINOGEN 1.0 10/09/2014 0900   NITRITE NEGATIVE 01/15/2017 1625   LEUKOCYTESUR NEGATIVE 01/15/2017 1625    Time coordinating discharge: Approximately 40 minutes  Vance Gather, MD  [facility] 01/17/2017, 12:33 PM Pager [phone]

## 2017-01-17 NOTE — Progress Notes (Signed)
Nutrition Education Note  RD consulted for nutrition education regarding CHF.  RD provided "Low Sodium Nutrition Therapy" handout from the Academy of [employer]. Reviewed patient's dietary recall. Provided examples on ways to decrease sodium intake in diet. Discouraged intake of processed foods and use of salt shaker. Encouraged fresh fruits and vegetables as well as whole grain sources of carbohydrates to maximize fiber intake.   RD discussed why it is important for patient to adhere to diet recommendations, and emphasized the role of fluids, foods to avoid, and importance of weighing self daily. Teach back method used.  Expect good compliance.  Body mass index is 50.01 kg/m. Pt meets criteria for morbid obesity based on current BMI.  Current diet order is Heart Healthy, patient is consuming approximately 100% of meals at this time. Labs and medications reviewed. No further nutrition interventions warranted at this time. RD contact information provided. If additional nutrition issues arise, please re-consult RD.   Kerman Passey MS, RD, LDN [phone] Pager  [phone] Weekend/On-Call Pager

## 2017-01-17 NOTE — Progress Notes (Signed)
Discharge instruction given.  All questions answered.

## 2017-01-17 NOTE — Plan of Care (Signed)
Problem: Bowel/Gastric: Goal: Will not experience complications related to bowel motility Outcome: Progressing Educated pt on monitoring BM and passing flatus.  Ambulation also helps improve motility.

## 2017-01-17 NOTE — Consult Note (Signed)
Referring Physician: Boykin Nearing, MD  Adam Dennis is an 56 y.o. male.                       Chief Complaint: Shortness of breath  HPI: 56 year old male with chronic left heart systolic and diastolic heart failure, chronic hypoxic respiratory failure had worsening dyspnea and leg edema with elevated BNP.  Past Medical History:  Diagnosis Date  . CHF (congestive heart failure) ([facility])   . Chronic combined systolic and diastolic heart failure ([facility])   . CKD (chronic kidney disease) stage 4, GFR 15-29 ml/min (HCC)   . Diabetes mellitus without complication ([facility])    TYPE 2  . Hypertension   . Hypertrophy of tonsils alone   . Obesity, unspecified   . Obstructive sleep apnea (adult) (pediatric)       Past Surgical History:  Procedure Laterality Date  . UMBILICAL HERNIA REPAIR      Family History  Problem Relation Age of Onset  . Hypertension Mother   . Diabetes Brother   . Diabetes Sister   . Cancer Maternal Aunt    Social History:  reports that he has never smoked. He has never used smokeless tobacco. He reports that he does not drink alcohol or use drugs.  Allergies: No Known Allergies  Medications Prior to Admission  Medication Sig Dispense Refill  . aspirin 325 MG tablet Take 325 mg by mouth daily.    Marland Kitchen atorvastatin (LIPITOR) 40 MG tablet Take 1 tablet (40 mg total) by mouth daily. 30 tablet 3  . calcium carbonate (TUMS - DOSED IN MG ELEMENTAL CALCIUM) 500 MG chewable tablet Chew 1 tablet (200 mg of elemental calcium total) by mouth 2 (two) times daily.    . carvedilol (COREG) 12.5 MG tablet Take 1 tablet (12.5 mg total) by mouth 2 (two) times daily. 60 tablet 2  . furosemide (LASIX) 40 MG tablet Take 2 tablets (80 mg total) by mouth 2 (two) times daily. 120 tablet 2  . hydrALAZINE (APRESOLINE) 50 MG tablet Take 0.5 tablets (25 mg total) by mouth 3 (three) times daily. 90 tablet 2  . isosorbide mononitrate (IMDUR) 30 MG 24 hr tablet Take 1 tablet (30 mg total) by mouth  daily. 30 tablet 5  . lisinopril (PRINIVIL,ZESTRIL) 20 MG tablet Take 1 tablet (20 mg total) by mouth daily. 30 tablet 3  . nitroGLYCERIN (NITROSTAT) 0.4 MG SL tablet Place 1 tablet (0.4 mg total) under the tongue every 5 (five) minutes x 3 doses as needed for chest pain. 25 tablet 1  . potassium chloride (K-DUR) 10 MEQ tablet Take 10 mEq by mouth daily.    . TRAVATAN Z 0.004 % SOLN ophthalmic solution Place 1 drop into both eyes at bedtime.  12    Results for orders placed or performed during the hospital encounter of 01/15/17 (from the past 48 hour(s))  Basic metabolic panel     Status: Abnormal   Collection Time: 01/15/17  1:24 PM  Result Value Ref Range   Sodium 141 135 - 145 mmol/L   Potassium 4.7 3.5 - 5.1 mmol/L   Chloride 106 101 - 111 mmol/L   CO2 31 22 - 32 mmol/L   Glucose, Bld 138 (H) 65 - 99 mg/dL   BUN 28 (H) 6 - 20 mg/dL   Creatinine, Ser 2.74 (H) 0.61 - 1.24 mg/dL   Calcium 8.2 (L) 8.9 - 10.3 mg/dL   GFR calc non Af Amer 24 (L) >  60 mL/min   GFR calc Af Amer 28 (L) >60 mL/min    Comment: (NOTE) The eGFR has been calculated using the CKD EPI equation. This calculation has not been validated in all clinical situations. eGFR's persistently <60 mL/min signify possible Chronic Kidney Disease.    Anion gap 4 (L) 5 - 15  CBC     Status: Abnormal   Collection Time: 01/15/17  1:24 PM  Result Value Ref Range   WBC 6.8 4.0 - 10.5 K/uL   RBC 4.91 4.22 - 5.81 MIL/uL   Hemoglobin 12.8 (L) 13.0 - 17.0 g/dL   HCT 41.7 39.0 - 52.0 %   MCV 84.9 78.0 - 100.0 fL   MCH 26.1 26.0 - 34.0 pg   MCHC 30.7 30.0 - 36.0 g/dL   RDW 17.9 (H) 11.5 - 15.5 %   Platelets 210 150 - 400 K/uL  I-stat troponin, ED     Status: None   Collection Time: 01/15/17  1:34 PM  Result Value Ref Range   Troponin i, poc 0.01 0.00 - 0.08 ng/mL   Comment 3            Comment: Due to the release kinetics of cTnI, a negative result within the first hours of the onset of symptoms does not rule out myocardial  infarction with certainty. If myocardial infarction is still suspected, repeat the test at appropriate intervals.   Brain natriuretic peptide     Status: Abnormal   Collection Time: 01/15/17  3:30 PM  Result Value Ref Range   B Natriuretic Peptide 692.5 (H) 0.0 - 100.0 pg/mL  Urinalysis, Routine w reflex microscopic     Status: Abnormal   Collection Time: 01/15/17  4:25 PM  Result Value Ref Range   Color, Urine YELLOW YELLOW   APPearance CLEAR CLEAR   Specific Gravity, Urine 1.010 1.005 - 1.030   pH 6.0 5.0 - 8.0   Glucose, UA NEGATIVE NEGATIVE mg/dL   Hgb urine dipstick SMALL (A) NEGATIVE   Bilirubin Urine NEGATIVE NEGATIVE   Ketones, ur NEGATIVE NEGATIVE mg/dL   Protein, ur 100 (A) NEGATIVE mg/dL   Nitrite NEGATIVE NEGATIVE   Leukocytes, UA NEGATIVE NEGATIVE   RBC / HPF 0-5 0 - 5 RBC/hpf   WBC, UA 0-5 0 - 5 WBC/hpf   Bacteria, UA NONE SEEN NONE SEEN   Squamous Epithelial / LPF 0-5 (A) NONE SEEN  Basic metabolic panel     Status: Abnormal   Collection Time: 01/16/17  4:16 AM  Result Value Ref Range   Sodium 142 135 - 145 mmol/L   Potassium 5.0 3.5 - 5.1 mmol/L   Chloride 103 101 - 111 mmol/L   CO2 30 22 - 32 mmol/L   Glucose, Bld 90 65 - 99 mg/dL   BUN 28 (H) 6 - 20 mg/dL   Creatinine, Ser 2.71 (H) 0.61 - 1.24 mg/dL   Calcium 8.1 (L) 8.9 - 10.3 mg/dL   GFR calc non Af Amer 25 (L) >60 mL/min   GFR calc Af Amer 29 (L) >60 mL/min    Comment: (NOTE) The eGFR has been calculated using the CKD EPI equation. This calculation has not been validated in all clinical situations. eGFR's persistently <60 mL/min signify possible Chronic Kidney Disease.    Anion gap 9 5 - 15  Basic metabolic panel     Status: Abnormal   Collection Time: 01/17/17  6:46 AM  Result Value Ref Range   Sodium 143 135 - 145 mmol/L  Potassium 5.1 3.5 - 5.1 mmol/L   Chloride 101 101 - 111 mmol/L   CO2 36 (H) 22 - 32 mmol/L   Glucose, Bld 108 (H) 65 - 99 mg/dL   BUN 28 (H) 6 - 20 mg/dL   Creatinine,  Ser 2.76 (H) 0.61 - 1.24 mg/dL   Calcium 8.3 (L) 8.9 - 10.3 mg/dL   GFR calc non Af Amer 24 (L) >60 mL/min   GFR calc Af Amer 28 (L) >60 mL/min    Comment: (NOTE) The eGFR has been calculated using the CKD EPI equation. This calculation has not been validated in all clinical situations. eGFR's persistently <60 mL/min signify possible Chronic Kidney Disease.    Anion gap 6 5 - 15   Dg Chest 2 View  Result Date: 01/15/2017 CLINICAL DATA:  Increasing shortness of breath. EXAM: CHEST  2 VIEW COMPARISON:  12/16/2016, 09/19/2016 and 12/11/2015 FINDINGS: There is chronic elevation of the left hemidiaphragm with shift of the heart to the right chronically. Chronic prominence of the right main pulmonary artery. Pulmonary vascularity is at the upper limits of normal. No acute infiltrates or effusions. IMPRESSION: No acute abnormality. Pulmonary vascularity is at the upper limits of normal. Electronically Signed   By: Lorriane Shire M.D.   On: 01/15/2017 14:03    Review Of Systems Constitutional: No fever, chills, chronic weight. Eyes: No vision change, wears glasses. No discharge or pain. Ears: No hearing loss, No tinnitus. Respiratory: No asthma, COPD, pneumonias. Positive shortness of breath. No hemoptysis. Cardiovascular: No chest pain, palpitation, positive leg edema. Gastrointestinal: No nausea, vomiting, diarrhea, constipation. No GI bleed. No hepatitis. Genitourinary: No dysuria, hematuria, kidney stone. No incontinance. Neurological: No headache, stroke, seizures.  Psychiatry: No psych facility admission for anxiety, depression, suicide. No detox. Skin: No rash. Musculoskeletal: No joint pain, fibromyalgia. No neck pain, back pain. Lymphadenopathy: No lymphadenopathy. Hematology: No anemia or easy bruising.   Blood pressure (!) 144/67, pulse 65, temperature 97.9 F (36.6 C), temperature source Oral, resp. rate 20, height 5' 11"  (1.803 m), weight (!) 162.7 kg (358 lb 9.6 oz), SpO2 100  %. Body mass index is 50.01 kg/m. General appearance: alert, cooperative, appears stated age and no distress Head: Normocephalic, atraumatic. Eyes: Brown eyes, pink conjunctiva, corneas clear. PERRL, EOM's intact. Neck: No adenopathy, no carotid bruit, no JVD, supple, symmetrical, trachea midline and thyroid not enlarged. Resp: Clearing to auscultation bilaterally. Cardio: Regular rate and rhythm, S1, S2 normal, II/VI systolic murmur, no click, rub or gallop GI: Soft, non-tender; bowel sounds normal; no organomegaly. Extremities: No edema, cyanosis or clubbing. Skin: Warm and dry.  Neurologic: Alert and oriented X 3, normal strength. Normal coordination and slow gait.  Assessment/Plan Acute on chronic left heart systolic and diastolic heart failure Morbid obesity Essential hypertension DM, II Obstructive sleep apnea CKD, IV Glaucoma Acute on chronic respiratory failure with hypoxia Hyperlipidemia  Continue medical treatment. Increase activity as tolerated. Dietary consult for DM, II, heart failure and obesity.  Birdie Riddle, MD  01/17/2017, 9:12 AM

## 2017-01-20 ENCOUNTER — Telehealth: Payer: Self-pay

## 2017-01-20 NOTE — Telephone Encounter (Addendum)
Message received from Heath Lark, [facility] noting that [facility] has been following the patient and has been trying to enroll him with tele-monitoring but have not been able to obtain orders from a provider.      [facility] Post-discharge Follow-Up Phone Call:  The patient has 2 hospital admissions in the past 4 months. He is known to the [facility] and is agreeable to being followed in the [facility] ([facility]) with Dr Jarold Song.  Date of Discharge: 01/17/17 Principal Discharge Diagnosis(es): acute on chronic systolic congestive heart failure Post-discharge Communication: (Clearly document all attempts clearly and date contact made) call placed to the patient  Call Completed: Yes                    With Whom: patient Interpreter Needed: no     Please check all that apply:  X  Patient is knowledgeable of his/her condition(s) and/or treatment. - He is interested in tele-monitoring with [facility]. He noted that he has a scale at home but it doesn't work.  He is hoping to have a scale with the tele-monitoring program.  ? Patient is caring for self at home.  X  Patient is receiving assist at home from family and/or caregiver. Family and/or caregiver is knowledgeable of patient's condition(s) and/or treatment. - He stated that he receives assistance at home from his wife.      Patient is receiving home health services. If so, name of agency. -     Medication Reconciliation:  ? Medication list reviewed with patient. X  Patient obtained all discharge medications. If not, why? He stated that he has all of his medications and is taking them as ordered. He noted that he has his discharge medication list and did not need to review it. He also said that he did not have any questions about the medications. Instructed him to bring the medications with him to his appointment.    Activities of Daily Living:  X  Independent with ADLs.  He said that he has O2 and CPAP from [facility].  He noted that he is  using the O2 @ 2L continuously since being home.  He has an O2 concentrator and small O2 tanks at home but is interested in a portable O2 concentrator. Informed him that this can be discussed with the MD at his appointment.  ? Needs assist (describe; ? home DME used) ? [facility] (describe, ? home DME used)   Community resources in place for patient:  X   [facility]  ? Home Health/Home DME ? Assisted Living ? Support Group              Questions/Concerns discussed: He stated that he can take a cab to his medical appointments. Instructed him that he can contact medicaid to have a transportation assessment to determine if he qualifies for medicaid transportation.  He received disability but has not followed through with a food stamps application because he said that he would only get about $15/month and does not feel that is worth the time to apply.   Overall he said that he is feeling " alright" and did not have any questions/concerns at this time. An appointment was scheduled for 01/24/17 @ 0945.      Call placed to Heath Lark, [facility] and informed her that the patient will be seeing Dr Jarold Song on 01/24/17 @ 0945.

## 2017-01-20 NOTE — Telephone Encounter (Signed)
Message received from Heath Lark, [place] requesting a call back. Call returned and message left with call back # [phone]

## 2017-01-24 ENCOUNTER — Ambulatory Visit: Payer: Medicaid Other | Attending: Family Medicine | Admitting: Family Medicine

## 2017-01-24 ENCOUNTER — Encounter: Payer: Self-pay | Admitting: Family Medicine

## 2017-01-24 ENCOUNTER — Telehealth: Payer: Self-pay

## 2017-01-24 VITALS — BP 85/54 | HR 74 | Temp 97.7°F | Ht 71.0 in | Wt 356.8 lb

## 2017-01-24 DIAGNOSIS — G4733 Obstructive sleep apnea (adult) (pediatric): Secondary | ICD-10-CM

## 2017-01-24 DIAGNOSIS — I130 Hypertensive heart and chronic kidney disease with heart failure and stage 1 through stage 4 chronic kidney disease, or unspecified chronic kidney disease: Secondary | ICD-10-CM | POA: Diagnosis not present

## 2017-01-24 DIAGNOSIS — E1122 Type 2 diabetes mellitus with diabetic chronic kidney disease: Secondary | ICD-10-CM | POA: Diagnosis not present

## 2017-01-24 DIAGNOSIS — I5042 Chronic combined systolic (congestive) and diastolic (congestive) heart failure: Secondary | ICD-10-CM

## 2017-01-24 DIAGNOSIS — N184 Chronic kidney disease, stage 4 (severe): Secondary | ICD-10-CM

## 2017-01-24 DIAGNOSIS — Z9889 Other specified postprocedural states: Secondary | ICD-10-CM | POA: Diagnosis not present

## 2017-01-24 DIAGNOSIS — J9611 Chronic respiratory failure with hypoxia: Secondary | ICD-10-CM | POA: Diagnosis not present

## 2017-01-24 DIAGNOSIS — Z7982 Long term (current) use of aspirin: Secondary | ICD-10-CM | POA: Insufficient documentation

## 2017-01-24 DIAGNOSIS — E1169 Type 2 diabetes mellitus with other specified complication: Secondary | ICD-10-CM

## 2017-01-24 DIAGNOSIS — Z79899 Other long term (current) drug therapy: Secondary | ICD-10-CM | POA: Insufficient documentation

## 2017-01-24 DIAGNOSIS — E669 Obesity, unspecified: Secondary | ICD-10-CM | POA: Diagnosis not present

## 2017-01-24 DIAGNOSIS — I10 Essential (primary) hypertension: Secondary | ICD-10-CM

## 2017-01-24 LAB — GLUCOSE, POCT (MANUAL RESULT ENTRY): POC Glucose: 143 mg/dl — AB (ref 70–99)

## 2017-01-24 MED ORDER — CARVEDILOL 6.25 MG PO TABS: 6.2500 mg | ORAL_TABLET | Freq: Two times a day (BID) | ORAL | 5 refills | Status: DC

## 2017-01-24 NOTE — Telephone Encounter (Signed)
Met with the patient when he was in the clinic today. Provided him with the phone # to call medicaid for a transportation assessment. Also assisted him with completing a [employer] application.  The completed application was faxed to [employer] eligibility - fax # [phone].

## 2017-01-24 NOTE — Patient Instructions (Signed)
Decrease carvedilol to 6.25 mg twice daily.

## 2017-01-24 NOTE — Progress Notes (Signed)
Transitional care clinic  Hospitalization dates: 01/15/17 through 8/24/8  Date of telephone call: 01/20/17 Subjective:  Patient ID: Adam Dennis, male    DOB: [birth year]  Age: 56 y.o. MRN: [id number]  CC: Congestive Heart Failure   HPI Adam Dennis is a 56 year old male with a history of hypertension, stage IV chronic kidney disease, obstructive sleep apnea, chronic respiratory failure with hypoxia (on 2 L of oxygen at rest), type 2 diabetes mellitus (A1c 6.2), combined systolic and diastolic CHF (EF 12-75% from 08/2016) with recent hospitalization for acute on chronic systolic and diastolic heart failure who presents to the transitional care clinic to establish care.  He had presented with worsening shortness of breath, pedal edema; was found to be hypoxic with oxygen saturation of 70 on 2 L of oxygen, BNP of 692, negative cardiac enzymes. Chest x-ray revealed chronic elevation of left hemidiaphragm with shift of the heart to the right, chronic prominence of the pulmonary artery, pulmonary vascularity at the upper limits of normal, no infiltrates or effusion.  He responded well to IV Lasix and his symptoms improved.  Since discharge he has been to see his cardiologist - had an appointment with Dr. Doylene Canard yesterday. He informs me forms were signed for his telemetry monitoring. His weight today is stable compared to discharge weight. Today he is needing a portable oxygen concentrator and connection tubing between his oxygen saturation and CPAP machine. He also informs me he has noticed some lightheadedness.  Past Medical History:  Diagnosis Date  . CHF (congestive heart failure) ([place])   . Chronic combined systolic and diastolic heart failure ([place])   . CKD (chronic kidney disease) stage 4, GFR 15-29 ml/min (HCC)   . Diabetes mellitus without complication ([place])    TYPE 2  . Hypertension   . Hypertrophy of tonsils alone   . Obesity, unspecified   . Obstructive sleep apnea (adult)  (pediatric)     Past Surgical History:  Procedure Laterality Date  . UMBILICAL HERNIA REPAIR      No Known Allergies  Outpatient Medications Prior to Visit  Medication Sig Dispense Refill  . aspirin 325 MG tablet Take 325 mg by mouth daily.    Marland Kitchen atorvastatin (LIPITOR) 40 MG tablet Take 1 tablet (40 mg total) by mouth daily. 30 tablet 3  . calcium carbonate (TUMS - DOSED IN MG ELEMENTAL CALCIUM) 500 MG chewable tablet Chew 1 tablet (200 mg of elemental calcium total) by mouth 2 (two) times daily.    . furosemide (LASIX) 40 MG tablet Take 2 tablets (80 mg total) by mouth 2 (two) times daily. 120 tablet 2  . hydrALAZINE (APRESOLINE) 50 MG tablet Take 0.5 tablets (25 mg total) by mouth 3 (three) times daily. 90 tablet 2  . isosorbide mononitrate (IMDUR) 30 MG 24 hr tablet Take 1 tablet (30 mg total) by mouth daily. 30 tablet 5  . lisinopril (PRINIVIL,ZESTRIL) 20 MG tablet Take 1 tablet (20 mg total) by mouth daily. 30 tablet 3  . nitroGLYCERIN (NITROSTAT) 0.4 MG SL tablet Place 1 tablet (0.4 mg total) under the tongue every 5 (five) minutes x 3 doses as needed for chest pain. 25 tablet 1  . potassium chloride (K-DUR) 10 MEQ tablet Take 10 mEq by mouth daily.    . TRAVATAN Z 0.004 % SOLN ophthalmic solution Place 1 drop into both eyes at bedtime.  12  . carvedilol (COREG) 12.5 MG tablet Take 1 tablet (12.5 mg total) by mouth 2 (two) times daily. [place]  tablet 2   No facility-administered medications prior to visit.     ROS Review of Systems  Constitutional: Negative for activity change and appetite change.  HENT: Negative for sinus pressure and sore throat.   Eyes: Negative for visual disturbance.  Respiratory: Negative for cough, chest tightness and shortness of breath.   Cardiovascular: Negative for chest pain and leg swelling.  Gastrointestinal: Negative for abdominal distention, abdominal pain, constipation and diarrhea.  Endocrine: Negative.   Genitourinary: Negative for dysuria.    Musculoskeletal: Negative for joint swelling and myalgias.  Skin: Negative for rash.  Allergic/Immunologic: Negative.   Neurological: Positive for light-headedness. Negative for weakness and numbness.  Psychiatric/Behavioral: Negative for dysphoric mood and suicidal ideas.    Objective:  BP (!) 85/54   Pulse 74   Temp 97.7 F (36.5 C) (Oral)   Ht 5\' 11"  (1.803 m)   Wt (!) 356 lb 12.8 oz (161.8 kg)   SpO2 92%   BMI 49.76 kg/m   BP/Weight 01/24/2017 01/17/2017 4/33/2951  Systolic BP 85 884 166  Diastolic BP 54 64 77  Wt. (Lbs) 356.8 358.6 357.2  BMI 49.76 50.01 49.82      Physical Exam  Constitutional: He is oriented to person, place, and time. He appears well-developed and well-nourished.  Obese  HENT:  On 2 L of oxygen via nasal cannula  Cardiovascular: Normal rate, normal heart sounds and intact distal pulses.   No murmur heard. Pulmonary/Chest: Effort normal and breath sounds normal. He has no wheezes. He has no rales. He exhibits no tenderness.  Abdominal: Soft. Bowel sounds are normal. He exhibits no distension and no mass. There is no tenderness.  Musculoskeletal: Normal range of motion.  Neurological: He is alert and oriented to person, place, and time.  Skin: Skin is warm and dry.  Psychiatric: He has a normal mood and affect.     Assessment & Plan:   1. Diabetes mellitus type 2 in obese ([place]) Controlled with A1c of 6.2 - POCT glucose (manual entry)  2. Essential hypertension, benign He is hypotensive Decreased carvedilol from 12.5 to 6.25 mg We'll reassess blood pressure at next visit - carvedilol (COREG) 6.25 MG tablet; Take 1 tablet (6.25 mg total) by mouth 2 (two) times daily.  Dispense: 60 tablet; Refill: 5  3. Chronic combined systolic and diastolic CHF (congestive heart failure) (HCC) EF of 45-50% 2-D echo 08/2016 Euvolemic Advised on daily weights, limit fluid intake to less than 2 L a day, low-sodium diet  4. CKD (chronic kidney disease)  stage 4, GFR 15-29 ml/min (HCC) Avoid nephrotoxins - Basic Metabolic Panel  5. Obstructive sleep apnea Currently on CPAP machine   6. Chronic respiratory failure with hypoxia (HCC) Currently on 2 L of oxygen Order written for DME supplies He will also need home health to assist with medications   Meds ordered this encounter  Medications  . carvedilol (COREG) 6.25 MG tablet    Sig: Take 1 tablet (6.25 mg total) by mouth 2 (two) times daily.    Dispense:  60 tablet    Refill:  5    Discontinue 12.5 mg    Follow-up: Return in about 2 weeks (around 02/07/2017) for TCC - follow-up on congestive heart failure.   [name] MD

## 2017-01-25 LAB — BASIC METABOLIC PANEL
BUN / CREAT RATIO: 10 (ref 9–20)
BUN: 28 mg/dL — AB (ref 6–24)
CHLORIDE: 100 mmol/L (ref 96–106)
CO2: 32 mmol/L — ABNORMAL HIGH (ref 20–29)
Calcium: 8.3 mg/dL — ABNORMAL LOW (ref 8.7–10.2)
Creatinine, Ser: 2.94 mg/dL — ABNORMAL HIGH (ref 0.76–1.27)
GFR calc Af Amer: 26 mL/min/{1.73_m2} — ABNORMAL LOW (ref >59)
GFR calc non Af Amer: 23 mL/min/{1.73_m2} — ABNORMAL LOW (ref >59)
Glucose: 105 mg/dL — ABNORMAL HIGH (ref 65–99)
Potassium: 5 mmol/L (ref 3.5–5.2)
Sodium: 147 mmol/L — ABNORMAL HIGH (ref 134–144)

## 2017-01-28 ENCOUNTER — Telehealth: Payer: Self-pay

## 2017-01-28 NOTE — Telephone Encounter (Signed)
Prescription for enrichment port for CPAP and portable O2 concentrator faxed to [facility].

## 2017-01-29 ENCOUNTER — Telehealth: Payer: Self-pay

## 2017-01-29 NOTE — Telephone Encounter (Signed)
Pt was called and informed of lab results. 

## 2017-01-30 ENCOUNTER — Telehealth: Payer: Self-pay | Admitting: Family Medicine

## 2017-01-30 NOTE — Telephone Encounter (Signed)
Call placed to [employer] [phone] regarding the order that was faxed on 9/4. I spoke with Necee and she informed me that it was received and processed. Pt is scheduled for an evaluation on 9/10 @ 10 am.

## 2017-02-04 ENCOUNTER — Telehealth: Payer: Self-pay | Admitting: Family Medicine

## 2017-02-04 NOTE — Telephone Encounter (Signed)
Call placed to [employer] [phone], regarding patient's application that was faxed on 01/24/17. Jonni Sanger (rep) informed me that Freda Munro no longer works there and the new person to contact is [employer]. Call was transferred to her. No answer. Left Candace a message asking her to return my call in regard's to patient's application.

## 2017-02-04 NOTE — Telephone Encounter (Signed)
Adam Dennis from [facility] returned my call and informed me that patient's application was received. It was put on hold due to the long weekend (Labor Day), but they will be reaching out to patient soon. They are other people before him, therefore it might take a week or two.

## 2017-02-04 NOTE — Telephone Encounter (Signed)
Re faxed patient's completed [employer] application to [employer] eligibility [phone] (fax).

## 2017-02-05 ENCOUNTER — Encounter: Payer: Self-pay | Admitting: Family Medicine

## 2017-02-05 ENCOUNTER — Ambulatory Visit (HOSPITAL_BASED_OUTPATIENT_CLINIC_OR_DEPARTMENT_OTHER): Payer: Medicaid Other | Admitting: Family Medicine

## 2017-02-05 VITALS — BP 118/62 | HR 74 | Temp 97.7°F | Ht 71.0 in | Wt 355.4 lb

## 2017-02-05 DIAGNOSIS — I10 Essential (primary) hypertension: Secondary | ICD-10-CM

## 2017-02-05 DIAGNOSIS — E1122 Type 2 diabetes mellitus with diabetic chronic kidney disease: Secondary | ICD-10-CM

## 2017-02-05 DIAGNOSIS — H409 Unspecified glaucoma: Secondary | ICD-10-CM | POA: Insufficient documentation

## 2017-02-05 DIAGNOSIS — Z79899 Other long term (current) drug therapy: Secondary | ICD-10-CM

## 2017-02-05 DIAGNOSIS — Z7982 Long term (current) use of aspirin: Secondary | ICD-10-CM | POA: Insufficient documentation

## 2017-02-05 DIAGNOSIS — E1169 Type 2 diabetes mellitus with other specified complication: Secondary | ICD-10-CM | POA: Diagnosis not present

## 2017-02-05 DIAGNOSIS — I130 Hypertensive heart and chronic kidney disease with heart failure and stage 1 through stage 4 chronic kidney disease, or unspecified chronic kidney disease: Secondary | ICD-10-CM | POA: Insufficient documentation

## 2017-02-05 DIAGNOSIS — G4733 Obstructive sleep apnea (adult) (pediatric): Secondary | ICD-10-CM | POA: Insufficient documentation

## 2017-02-05 DIAGNOSIS — E669 Obesity, unspecified: Secondary | ICD-10-CM | POA: Diagnosis not present

## 2017-02-05 DIAGNOSIS — Z9981 Dependence on supplemental oxygen: Secondary | ICD-10-CM | POA: Insufficient documentation

## 2017-02-05 DIAGNOSIS — R05 Cough: Secondary | ICD-10-CM

## 2017-02-05 DIAGNOSIS — N184 Chronic kidney disease, stage 4 (severe): Secondary | ICD-10-CM

## 2017-02-05 DIAGNOSIS — J9611 Chronic respiratory failure with hypoxia: Secondary | ICD-10-CM

## 2017-02-05 DIAGNOSIS — K219 Gastro-esophageal reflux disease without esophagitis: Secondary | ICD-10-CM

## 2017-02-05 DIAGNOSIS — R42 Dizziness and giddiness: Secondary | ICD-10-CM | POA: Insufficient documentation

## 2017-02-05 DIAGNOSIS — I959 Hypotension, unspecified: Secondary | ICD-10-CM | POA: Insufficient documentation

## 2017-02-05 DIAGNOSIS — I5042 Chronic combined systolic (congestive) and diastolic (congestive) heart failure: Secondary | ICD-10-CM

## 2017-02-05 LAB — GLUCOSE, POCT (MANUAL RESULT ENTRY): POC Glucose: 85 mg/dl (ref 70–99)

## 2017-02-05 MED ORDER — ACCU-CHEK SOFTCLIX LANCET DEV MISC: 5 refills | Status: DC

## 2017-02-05 MED ORDER — GLUCOSE BLOOD VI STRP: ORAL_STRIP | 12 refills | Status: DC

## 2017-02-05 MED ORDER — ACCU-CHEK AVIVA DEVI: 0 refills | Status: DC

## 2017-02-05 NOTE — Telephone Encounter (Signed)
Met with the patient when he was in the clinic today. He stated that he received a call from [employer] and they will be calling him back to schedule an assessment.  He stated that he did not qualify for the portable O2 concentrator when he was assessed yesterday.  He also noted that he is still waiting for the enrichment port.  He inquired about housing information. He currently has housing and an income but is interested in finding a new place to live. Provided him with the information about the housing coalition.

## 2017-02-05 NOTE — Progress Notes (Addendum)
Subjective:  Patient ID: Adam Dennis, male    DOB: [birth year]  Age: 56 y.o. MRN: [id number]  CC: Congestive Heart Failure   HPI TRAEVION POEHLER is a 56 y/o male with PMHx of CHF, DM type 2, HTN, OSA, CKD stage 4, and chronic respiratory failure with required oxygen therapy. Patient presents today for follow-up regarding CHF, hypotension, dizziness, and DM type 2.  Patient recently seen in clinic (01/24/17) with noted hypotensive BP (85/54); carvedilol was reduced from 12.5 to 6.25 mg. Reports doing well at home with good and bad days. Currently taking medications as prescribed without difficulty. Reports chronic non-productive cough, orthopnea, and PND. Sleeps on 2-3 pillows per night. Patient reports poor sleep from frequent moments throughout the night where he has to wake-up and sit to the side of the bed due to his shortness of breath. Denies chest pain, peripheral swelling, palpitations, increased weight gain, or fatigue. Reports intermittent dizziness that is continuous throughout the day and believes the dizziness may be related to [employer]. Denies tinnitus, nausea, vomiting, or near syncope at home. Patient was seen by cardiologist last month and is scheduled for home telemetry installment this month.   Patient reports increased use of oxygen therapy at home from 2L [place] to 3-4L [place]. He uses his oxygen continuously throughout the day. He states while sitting his oxygenation and shortness of breath is controlled; however, he has increase dyspnea on exertion with oxygen therapy when walking up a flight of stairs. Patient describes having to rest after 4-5 stairs for a few minutes prior to continuing. Denies chest pain in exertion, claudication, or near syncope with exertion. Denies fevers, chills, malaise, or sudden weight loss. He was evaluated by [facility] for portable oxygen therapy; however, did not qualify based on oxygen requirement. Reports complication with CPAP  machine and requested a new mask and instructional teaching on how to attach his oxygen to the CPAP machine.   Patient reports intermittent epigastric discomfort with a burning sensation. Pain is localized and non-radiating. Denies nausea, vomiting, increased dizziness, or palpitations with the pain. The pain is more prominent after eating when he lies supine. Patient is taking Tums and reports moderate control with medication.   Patient's last A1C was 6.2. Patient does not check blood gluoces at home or keep a log. He is scheduled this week to have a glucometer machine placed for regular glucose checks. He is asymptomatic for hypoglycemic and hyperglycemic episodes at home. Denies numbness, tingling, reduced sensation, or recent changes in vision from baseline glaucoma.   Outpatient Medications Prior to Visit  Medication Sig Dispense Refill  . aspirin 325 MG tablet Take 325 mg by mouth daily.    Marland Kitchen atorvastatin (LIPITOR) 40 MG tablet Take 1 tablet (40 mg total) by mouth daily. 30 tablet 3  . calcium carbonate (TUMS - DOSED IN MG ELEMENTAL CALCIUM) 500 MG chewable tablet Chew 1 tablet (200 mg of elemental calcium total) by mouth 2 (two) times daily.    . carvedilol (COREG) 6.25 MG tablet Take 1 tablet (6.25 mg total) by mouth 2 (two) times daily. 60 tablet 5  . furosemide (LASIX) 40 MG tablet Take 2 tablets (80 mg total) by mouth 2 (two) times daily. 120 tablet 2  . hydrALAZINE (APRESOLINE) 50 MG tablet Take 0.5 tablets (25 mg total) by mouth 3 (three) times daily. 90 tablet 2  . isosorbide mononitrate (IMDUR) 30 MG 24 hr tablet Take 1 tablet (30 mg total) by mouth daily.  30 tablet 5  . lisinopril (PRINIVIL,ZESTRIL) 20 MG tablet Take 1 tablet (20 mg total) by mouth daily. 30 tablet 3  . nitroGLYCERIN (NITROSTAT) 0.4 MG SL tablet Place 1 tablet (0.4 mg total) under the tongue every 5 (five) minutes x 3 doses as needed for chest pain. 25 tablet 1  . potassium chloride (K-DUR) 10 MEQ tablet Take 10 mEq  by mouth daily.    . TRAVATAN Z 0.004 % SOLN ophthalmic solution Place 1 drop into both eyes at bedtime.  12   No facility-administered medications prior to visit.     ROS Review of Systems  Constitutional: Negative for activity change, appetite change, chills, fatigue, fever and unexpected weight change.  HENT: Negative for congestion, ear pain, postnasal drip, rhinorrhea and tinnitus.   Eyes: Negative for pain and visual disturbance.  Respiratory: Positive for cough and shortness of breath. Negative for choking, chest tightness and wheezing.   Cardiovascular: Negative for chest pain, palpitations and leg swelling.  Gastrointestinal: Negative for abdominal pain, constipation, nausea and vomiting.  Endocrine: Negative for polydipsia, polyphagia and polyuria.  Genitourinary: Negative for difficulty urinating, dysuria, flank pain, hematuria and urgency.  Musculoskeletal: Negative.   Skin: Negative for rash and wound.  Allergic/Immunologic: Negative.   Neurological: Positive for dizziness and light-headedness. Negative for syncope, weakness, numbness and headaches.  Hematological: Negative for adenopathy.  Psychiatric/Behavioral: Negative.    Objective:  BP 118/62   Pulse 74   Temp 97.7 F (36.5 C) (Oral)   Ht 5\' 11"  (1.803 m)   Wt (!) 355 lb 6.4 oz (161.2 kg)   SpO2 (!) 88%   BMI 49.57 kg/m   BP/Weight 02/05/2017 01/24/2017 5/78/4696  Systolic BP 295 85 284  Diastolic BP 62 54 64  Wt. (Lbs) 355.4 356.8 358.6  BMI 49.57 49.76 50.01   Physical Exam Vitals:   02/05/17 1008  BP: 118/62  Pulse: 74  Temp: 97.7 F (36.5 C)  SpO2: (!) 88%    General: Alert and oriented to person, place, and time. He is an obese male that appears age and well-nourished. He is talkative with a normal affect. HENT: normalcephalic, PERRLA, trachea midline, mucus membrane pink and moist, no nasal polys present, TM clear, bilaterlaly. No lymphadenopathy present.  CV: S1,S2 present with noted extra  heart sound of S3. No murmurs present. Negative for carotid bruits.Pulses +2 radial and DP, bilaterally. Capillary refill<2 seconds, bilaterally. No peripheral edema present. Patient was negative for homan's sign.  Resp: On 3L [place] with normal effort. BS clear, bilaterally.  Noted symmetrical rise and fall of chest. No wheezes, rales, or rhocnic present.  GI: Abdomen soft and non-tender. Hyperactive sounds present. No obvious renal or aortic bruits present. Negative for CVA tenderness.  MSK: Moves all extremities without difficulty . Neuro: CNII-CNXII intact. Sensation intact, bilaterally. Biceps, Brachioradial, and Petellar reflexes 2/2, bilaterally.   Skin: Warm and dry. Note dryness of ankles and feet. No rashes, wounds, or lesions present.   Assessment & Plan:   1. Diabetes mellitus type 2 in obese (HCC), Controlled  - POCT glucose (manual entry) - Ambulatory referral to Ophthalmology - glucose blood (ACCU-CHEK AVIVA) test strip; Use as instructed daily  Dispense: 100 each; Refill: 12 - Blood Glucose Monitoring Suppl (ACCU-CHEK AVIVA) device; Use as instructed daily.  Dispense: 1 each; Refill: 0 - Lancet Devices (ACCU-CHEK SOFTCLIX) lancets; Use as instructed daily.  Dispense: 1 each; Refill: 5  2. Essential hypertension, benign, Controlled -Continue with medication regimen.  - In-home telemonitoring is  expected to monitor blood pressure.  - Diet teaching with reduced fats, carbohydrates, less than 2 grams of salt per day, and more intake of fruits and vegetables. -Exercise teaching with attempting to do exercise in the chair with resistant bands or small weights. -Follow-up in clinic to repeat labs in 1 month.   3. CKD (chronic kidney disease) stage 4, GFR 15-29 ml/min ([place]), Controlled -Continue with medication regimen. -Expected schedule follow-up with Nephrology in 3 months. -Reduced fluid intake, no more than 2 liters of fluid per day.  -Reduced salt intake, no more than 2 grams per  day. -Maintain a diet with fruits and vegetables.  -Avoid over the counter medications, as they may affect kidney function.  -Follow-up in clinic to repeat labs in 1 month.   4. Chronic respiratory failure with hypoxia ([place]), Uncontrolled  - Pulmonary function test; Future - Increase oxygen therapy to 4 L [place] throughout the day. - Expected follow-up with [facility] for CPAP machine and oxygen therapy.   5. Chronic combined systolic and diastolic CHF (congestive heart failure) ([place]), Controlled -Continue with medication regimen. -Instructed to maintain a heart healthy diet with reduced intake of no more than 2 grams of salt. -Monitor weight gain daily, not to exceed more than 3 lbs of weight per day. -Instructed to maintain reduced fluid intake, not to exceed 2 liters per day.  6. Gastroesophageal reflux disease without esophagitis, Controlled  - Continue taking Tums for acid reflux. -Follow-up in 1 month, if not better consider a PPI medication  7. Dizziness. - Medication induced.  -Expected in-home telemonitoring. -Follow-up in 1 month for evaluation.   Meds ordered this encounter  Medications  . glucose blood (ACCU-CHEK AVIVA) test strip    Sig: Use as instructed daily    Dispense:  100 each    Refill:  12  . Blood Glucose Monitoring Suppl (ACCU-CHEK AVIVA) device    Sig: Use as instructed daily.    Dispense:  1 each    Refill:  0  . Lancet Devices (ACCU-CHEK SOFTCLIX) lancets    Sig: Use as instructed daily.    Dispense:  1 each    Refill:  5    Follow-up: Return in about 1 month (around 03/07/2017) for Follow-up on dizziness and dyspnea.     Evaluation and management procedures were performed by me with DNP Student in attendance, note written by DNP student under my supervision and collaboration. I have reviewed the note and I agree with the management and plan.   [name], MD. [facility] [place],  [place]   02/05/2017, 2:09 PM

## 2017-02-05 NOTE — Patient Instructions (Signed)
Heart Failure °Heart failure is a condition in which the heart has trouble pumping blood because it has become weak or stiff. This means that the heart does not pump blood efficiently for the body to work well. For some people with heart failure, fluid may back up into the lungs and there may be swelling (edema) in the lower legs. Heart failure is usually a long-term (chronic) condition. It is important for you to take good care of yourself and follow the treatment plan from your health care provider. °What are the causes? °This condition is caused by some health problems, including: °· High blood pressure (hypertension). Hypertension causes the heart muscle to work harder than normal. High blood pressure eventually causes the heart to become stiff and weak. °· Coronary artery disease (CAD). CAD is the buildup of cholesterol and fat (plaques) in the arteries of the heart. °· Heart attack (myocardial infarction). Injured tissue, which is caused by the heart attack, does not contract as well and the heart's ability to pump blood is weakened. °· Abnormal heart valves. When the heart valves do not open and close properly, the heart muscle must pump harder to keep the blood flowing. °· Heart muscle disease (cardiomyopathy or myocarditis). Heart muscle disease is damage to the heart muscle from a variety of causes, such as drug or alcohol abuse, infections, or unknown causes. These can increase the risk of heart failure. °· Lung disease. When the lungs do not work properly, the heart must work harder. ° °What increases the risk? °Risk of heart failure increases as a person ages. This condition is also more likely to develop in people who: °· Are overweight. °· Are male. °· Smoke or chew tobacco. °· Abuse alcohol or illegal drugs. °· Have taken medicines that can damage the heart, such as chemotherapy drugs. °· Have diabetes. °? High blood sugar (glucose) is associated with high fat (lipid) levels in the blood. °? Diabetes  can also damage tiny blood vessels that carry nutrients to the heart muscle. °· Have abnormal heart rhythms. °· Have thyroid problems. °· Have low blood counts (anemia). ° °What are the signs or symptoms? °Symptoms of this condition include: °· Shortness of breath with activity, such as when climbing stairs. °· Persistent cough. °· Swelling of the feet, ankles, legs, or abdomen. °· Unexplained weight gain. °· Difficulty breathing when lying flat (orthopnea). °· Waking from sleep because of the need to sit up and get more air. °· Rapid heartbeat. °· Fatigue and loss of energy. °· Feeling light-headed, dizzy, or close to fainting. °· Loss of appetite. °· Nausea. °· Increased urination during the night (nocturia). °· Confusion. ° °How is this diagnosed? °This condition is diagnosed based on: °· Medical history, symptoms, and a physical exam. °· Diagnostic tests, which may include: °? Echocardiogram. °? Electrocardiogram (ECG). °? Chest X-ray. °? Blood tests. °? Exercise stress test. °? Radionuclide scans. °? Cardiac catheterization and angiogram. ° °How is this treated? °Treatment for this condition is aimed at managing the symptoms of heart failure. Medicines, behavioral changes, or other treatments may be necessary to treat heart failure. °Medicines °These may include: °· Angiotensin-converting enzyme (ACE) inhibitors. This type of medicine blocks the effects of a blood protein called angiotensin-converting enzyme. ACE inhibitors relax (dilate) the blood vessels and help to lower blood pressure. °· Angiotensin receptor blockers (ARBs). This type of medicine blocks the actions of a blood protein called angiotensin. ARBs dilate the blood vessels and help to lower blood pressure. °· Water   pills (diuretics). Diuretics cause the kidneys to remove salt and water from the blood. The extra fluid is removed through urination, leaving a lower volume of blood that the heart has to pump. °· Beta blockers. These improve heart  muscle strength and they prevent the heart from beating too quickly. °· Digoxin. This increases the force of the heartbeat. ° °Healthy behavior changes °These may include: °· Reaching and maintaining a healthy weight. °· Stopping smoking or chewing tobacco. °· Eating heart-healthy foods. °· Limiting or avoiding alcohol. °· Stopping use of street drugs (illegal drugs). °· Physical activity. ° °Other treatments °These may include: °· Surgery to open blocked coronary arteries or repair damaged heart valves. °· Placement of a biventricular pacemaker to improve heart muscle function (cardiac resynchronization therapy). This device paces both the right ventricle and left ventricle. °· Placement of a device to treat serious abnormal heart rhythms (implantable cardioverter defibrillator, or ICD). °· Placement of a device to improve the pumping ability of the heart (left ventricular assist device, or LVAD). °· Heart transplant. This can cure heart failure, and it is considered for certain patients who do not improve with other therapies. ° °Follow these instructions at home: °Medicines °· Take over-the-counter and prescription medicines only as told by your health care provider. Medicines are important in reducing the workload of your heart, slowing the progression of heart failure, and improving your symptoms. °? Do not stop taking your medicine unless your health care provider told you to do that. °? Do not skip any dose of medicine. °? Refill your prescriptions before you run out of medicine. You need your medicines every day. °Eating and drinking ° °· Eat heart-healthy foods. Talk with a dietitian to make an eating plan that is right for you. °? Choose foods that contain no trans fat and are low in saturated fat and cholesterol. Healthy choices include fresh or frozen fruits and vegetables, fish, lean meats, legumes, fat-free or low-fat dairy products, and whole-grain or high-fiber foods. °? Limit salt (sodium) if  directed by your health care provider. Sodium restriction may reduce symptoms of heart failure. Ask a dietitian to recommend heart-healthy seasonings. °? Use healthy cooking methods instead of frying. Healthy methods include roasting, grilling, broiling, baking, poaching, steaming, and stir-frying. °· Limit your fluid intake if directed by your health care provider. Fluid restriction may reduce symptoms of heart failure. °Lifestyle °· Stop smoking or using chewing tobacco. Nicotine and tobacco can damage your heart and your blood vessels. Do not use nicotine gum or patches before talking to your health care provider. °· Limit alcohol intake to no more than 1 drink per day for non-pregnant women and 2 drinks per day for men. One drink equals 12 oz of beer, 5 oz of wine, or 1½ oz of hard liquor. °? Drinking more than that is harmful to your heart. Tell your health care provider if you drink alcohol several times a week. °? Talk with your health care provider about whether any level of alcohol use is safe for you. °? If your heart has already been damaged by alcohol or you have severe heart failure, drinking alcohol should be stopped completely. °· Stop use of illegal drugs. °· Lose weight if directed by your health care provider. Weight loss may reduce symptoms of heart failure. °· Do moderate physical activity if directed by your health care provider. People who are elderly and people with severe heart failure should consult with a health care provider for physical activity recommendations. °  Monitor important information °· Weigh yourself every day. Keeping track of your weight daily helps you to notice excess fluid sooner. °? Weigh yourself every morning after you urinate and before you eat breakfast. °? Wear the same amount of clothing each time you weigh yourself. °? Record your daily weight. Provide your health care provider with your weight record. °· Monitor and record your blood pressure as told by your health  care provider. °· Check your pulse as told by your health care provider. °Dealing with extreme temperatures °· If the weather is extremely hot: °? Avoid vigorous physical activity. °? Use air conditioning or fans or seek a cooler location. °? Avoid caffeine and alcohol. °? Wear loose-fitting, lightweight, and light-colored clothing. °· If the weather is extremely cold: °? Avoid vigorous physical activity. °? Layer your clothes. °? Wear mittens or gloves, a hat, and a scarf when you go outside. °? Avoid alcohol. °General instructions °· Manage other health conditions such as hypertension, diabetes, thyroid disease, or abnormal heart rhythms as told by your health care provider. °· Learn to manage stress. If you need help to do this, ask your health care provider. °· Plan rest periods when fatigued. °· Get ongoing education and support as needed. °· Participate in or seek rehabilitation as needed to maintain or improve independence and quality of life. °· Stay up to date with immunizations. Keeping current on pneumococcal and influenza immunizations is especially important to prevent respiratory infections. °· Keep all follow-up visits as told by your health care provider. This is important. °Contact a health care provider if: °· You have a rapid weight gain. °· You have increasing shortness of breath that is unusual for you. °· You are unable to participate in your usual physical activities. °· You tire easily. °· You cough more than normal, especially with physical activity. °· You have any swelling or more swelling in areas such as your hands, feet, ankles, or abdomen. °· You are unable to sleep because it is hard to breathe. °· You feel like your heart is beating quickly (palpitations). °· You become dizzy or light-headed when you stand up. °Get help right away if: °· You have difficulty breathing. °· You notice or your family notices a change in your awareness, such as having trouble staying awake or having  difficulty with concentration. °· You have pain or discomfort in your chest. °· You have an episode of fainting (syncope). °This information is not intended to replace advice given to you by your health care provider. Make sure you discuss any questions you have with your health care provider. °Document Released: 05/13/2005 Document Revised: 01/16/2016 Document Reviewed: 12/06/2015 °Elsevier Interactive Patient Education © 2017 [employer] ° °

## 2017-02-06 ENCOUNTER — Inpatient Hospital Stay (HOSPITAL_COMMUNITY)
Admission: EM | Admit: 2017-02-06 | Discharge: 2017-02-11 | DRG: 291 | Disposition: A | Discharge status: Home / Self Care | Payer: Medicaid Other | Attending: Cardiovascular Disease | Admitting: Cardiovascular Disease

## 2017-02-06 ENCOUNTER — Encounter (HOSPITAL_COMMUNITY): Payer: Self-pay | Admitting: Emergency Medicine

## 2017-02-06 ENCOUNTER — Emergency Department (HOSPITAL_COMMUNITY): Payer: Medicaid Other

## 2017-02-06 DIAGNOSIS — I509 Heart failure, unspecified: Secondary | ICD-10-CM

## 2017-02-06 DIAGNOSIS — H409 Unspecified glaucoma: Secondary | ICD-10-CM | POA: Diagnosis present

## 2017-02-06 DIAGNOSIS — N184 Chronic kidney disease, stage 4 (severe): Secondary | ICD-10-CM | POA: Diagnosis present

## 2017-02-06 DIAGNOSIS — E1122 Type 2 diabetes mellitus with diabetic chronic kidney disease: Secondary | ICD-10-CM | POA: Diagnosis present

## 2017-02-06 DIAGNOSIS — E1121 Type 2 diabetes mellitus with diabetic nephropathy: Secondary | ICD-10-CM | POA: Diagnosis present

## 2017-02-06 DIAGNOSIS — R0602 Shortness of breath: Secondary | ICD-10-CM | POA: Diagnosis not present

## 2017-02-06 DIAGNOSIS — G4733 Obstructive sleep apnea (adult) (pediatric): Secondary | ICD-10-CM | POA: Diagnosis present

## 2017-02-06 DIAGNOSIS — Z8249 Family history of ischemic heart disease and other diseases of the circulatory system: Secondary | ICD-10-CM

## 2017-02-06 DIAGNOSIS — E785 Hyperlipidemia, unspecified: Secondary | ICD-10-CM | POA: Diagnosis present

## 2017-02-06 DIAGNOSIS — I2510 Atherosclerotic heart disease of native coronary artery without angina pectoris: Secondary | ICD-10-CM | POA: Diagnosis present

## 2017-02-06 DIAGNOSIS — Z7982 Long term (current) use of aspirin: Secondary | ICD-10-CM | POA: Diagnosis not present

## 2017-02-06 DIAGNOSIS — I5043 Acute on chronic combined systolic (congestive) and diastolic (congestive) heart failure: Secondary | ICD-10-CM | POA: Diagnosis present

## 2017-02-06 DIAGNOSIS — Z6841 Body Mass Index (BMI) 40.0 and over, adult: Secondary | ICD-10-CM

## 2017-02-06 DIAGNOSIS — J9621 Acute and chronic respiratory failure with hypoxia: Secondary | ICD-10-CM | POA: Diagnosis present

## 2017-02-06 DIAGNOSIS — Z23 Encounter for immunization: Secondary | ICD-10-CM

## 2017-02-06 DIAGNOSIS — I130 Hypertensive heart and chronic kidney disease with heart failure and stage 1 through stage 4 chronic kidney disease, or unspecified chronic kidney disease: Secondary | ICD-10-CM | POA: Diagnosis present

## 2017-02-06 DIAGNOSIS — I5021 Acute systolic (congestive) heart failure: Secondary | ICD-10-CM | POA: Diagnosis present

## 2017-02-06 DIAGNOSIS — I10 Essential (primary) hypertension: Secondary | ICD-10-CM

## 2017-02-06 DIAGNOSIS — I5042 Chronic combined systolic (congestive) and diastolic (congestive) heart failure: Secondary | ICD-10-CM

## 2017-02-06 DIAGNOSIS — Z9981 Dependence on supplemental oxygen: Secondary | ICD-10-CM | POA: Diagnosis not present

## 2017-02-06 DIAGNOSIS — Z833 Family history of diabetes mellitus: Secondary | ICD-10-CM | POA: Diagnosis not present

## 2017-02-06 HISTORY — DX: Unspecified osteoarthritis, unspecified site: M19.90

## 2017-02-06 HISTORY — DX: Obstructive sleep apnea (adult) (pediatric): G47.33

## 2017-02-06 HISTORY — DX: Acute myocardial infarction, unspecified: I21.9

## 2017-02-06 HISTORY — DX: Type 2 diabetes mellitus without complications: E11.9

## 2017-02-06 HISTORY — DX: Pure hypercholesterolemia, unspecified: E78.00

## 2017-02-06 HISTORY — DX: Dependence on supplemental oxygen: Z99.81

## 2017-02-06 HISTORY — DX: Dependence on other enabling machines and devices: Z99.89

## 2017-02-06 LAB — CBC
HCT: 42.7 % (ref 39.0–52.0)
HCT: 43.7 % (ref 39.0–52.0)
Hemoglobin: 12.4 g/dL — ABNORMAL LOW (ref 13.0–17.0)
Hemoglobin: 13 g/dL (ref 13.0–17.0)
MCH: 25.1 pg — ABNORMAL LOW (ref 26.0–34.0)
MCH: 25.6 pg — AB (ref 26.0–34.0)
MCHC: 29 g/dL — ABNORMAL LOW (ref 30.0–36.0)
MCHC: 29.7 g/dL — AB (ref 30.0–36.0)
MCV: 86.3 fL (ref 78.0–100.0)
MCV: 86.2 fL (ref 78.0–100.0)
PLATELETS: 187 10*3/uL (ref 150–400)
Platelets: 215 10*3/uL (ref 150–400)
RBC: 4.95 MIL/uL (ref 4.22–5.81)
RBC: 5.07 MIL/uL (ref 4.22–5.81)
RDW: 19.1 % — ABNORMAL HIGH (ref 11.5–15.5)
RDW: 19 % — AB (ref 11.5–15.5)
WBC: 5.9 10*3/uL (ref 4.0–10.5)
WBC: 5.5 10*3/uL (ref 4.0–10.5)

## 2017-02-06 LAB — BASIC METABOLIC PANEL
ANION GAP: 6 (ref 5–15)
BUN: 31 mg/dL — ABNORMAL HIGH (ref 6–20)
CHLORIDE: 97 mmol/L — AB (ref 101–111)
CO2: 36 mmol/L — AB (ref 22–32)
Calcium: 8.5 mg/dL — ABNORMAL LOW (ref 8.9–10.3)
Creatinine, Ser: 2.89 mg/dL — ABNORMAL HIGH (ref 0.61–1.24)
GFR calc Af Amer: 27 mL/min — ABNORMAL LOW (ref >60)
GFR, EST NON AFRICAN AMERICAN: 23 mL/min — AB (ref >60)
Glucose, Bld: 97 mg/dL (ref 65–99)
POTASSIUM: 4.3 mmol/L (ref 3.5–5.1)
Sodium: 139 mmol/L (ref 135–145)

## 2017-02-06 LAB — BRAIN NATRIURETIC PEPTIDE: B Natriuretic Peptide: 358.3 pg/mL — ABNORMAL HIGH (ref 0.0–100.0)

## 2017-02-06 LAB — GLUCOSE, CAPILLARY: GLUCOSE-CAPILLARY: 109 mg/dL — AB (ref 65–99)

## 2017-02-06 LAB — CREATININE, SERUM
Creatinine, Ser: 2.73 mg/dL — ABNORMAL HIGH (ref 0.61–1.24)
GFR calc Af Amer: 29 mL/min — ABNORMAL LOW (ref >60)
GFR calc non Af Amer: 25 mL/min — ABNORMAL LOW (ref >60)

## 2017-02-06 LAB — I-STAT TROPONIN, ED: Troponin i, poc: 0.01 ng/mL (ref 0.00–0.08)

## 2017-02-06 LAB — TROPONIN I
Troponin I: <0.03 ng/mL (ref <0.03)
Troponin I: <0.03 ng/mL (ref <0.03)

## 2017-02-06 MED ORDER — FUROSEMIDE 10 MG/ML IJ SOLN
40.0000 mg | Freq: Once | INTRAMUSCULAR | Status: AC
  Administered 2017-02-06: 40 mg via INTRAVENOUS
  Filled 2017-02-06: qty 4

## 2017-02-06 MED ORDER — ISOSORBIDE MONONITRATE ER 30 MG PO TB24
30.0000 mg | ORAL_TABLET | Freq: Every day | ORAL | Status: DC
  Administered 2017-02-06 – 2017-02-11 (×6): 30 mg via ORAL
  Filled 2017-02-06 (×6): qty 1

## 2017-02-06 MED ORDER — SODIUM CHLORIDE 0.9% FLUSH
3.0000 mL | Freq: Two times a day (BID) | INTRAVENOUS | Status: DC
  Administered 2017-02-06 – 2017-02-11 (×10): 3 mL via INTRAVENOUS

## 2017-02-06 MED ORDER — SODIUM CHLORIDE 0.9% FLUSH: 3.0000 mL | INTRAVENOUS | Status: DC | PRN

## 2017-02-06 MED ORDER — HYDRALAZINE HCL 25 MG PO TABS
25.0000 mg | ORAL_TABLET | Freq: Three times a day (TID) | ORAL | Status: DC
  Administered 2017-02-06 – 2017-02-11 (×13): 25 mg via ORAL
  Filled 2017-02-06 (×14): qty 1

## 2017-02-06 MED ORDER — LISINOPRIL 20 MG PO TABS
20.0000 mg | ORAL_TABLET | Freq: Every day | ORAL | Status: DC
  Administered 2017-02-06 – 2017-02-07 (×2): 20 mg via ORAL
  Filled 2017-02-06 (×2): qty 1

## 2017-02-06 MED ORDER — HEPARIN SODIUM (PORCINE) 5000 UNIT/ML IJ SOLN
5000.0000 [IU] | Freq: Three times a day (TID) | INTRAMUSCULAR | Status: DC
  Administered 2017-02-06 – 2017-02-11 (×15): 5000 [IU] via SUBCUTANEOUS
  Filled 2017-02-06 (×15): qty 1

## 2017-02-06 MED ORDER — ONDANSETRON HCL 4 MG/2ML IJ SOLN: 4.0000 mg | Freq: Four times a day (QID) | INTRAMUSCULAR | Status: DC | PRN

## 2017-02-06 MED ORDER — FUROSEMIDE 10 MG/ML IJ SOLN
40.0000 mg | Freq: Two times a day (BID) | INTRAMUSCULAR | Status: DC
  Administered 2017-02-06: 40 mg via INTRAVENOUS
  Filled 2017-02-06: qty 4

## 2017-02-06 MED ORDER — POTASSIUM CHLORIDE CRYS ER 10 MEQ PO TBCR
10.0000 meq | EXTENDED_RELEASE_TABLET | Freq: Every day | ORAL | Status: DC
  Administered 2017-02-06 – 2017-02-08 (×3): 10 meq via ORAL
  Filled 2017-02-06 (×3): qty 1

## 2017-02-06 MED ORDER — INFLUENZA VAC SPLIT QUAD 0.5 ML IM SUSY
0.5000 mL | PREFILLED_SYRINGE | INTRAMUSCULAR | Status: AC
  Administered 2017-02-07: 0.5 mL via INTRAMUSCULAR
  Filled 2017-02-06: qty 0.5

## 2017-02-06 MED ORDER — ASPIRIN 325 MG PO TABS
325.0000 mg | ORAL_TABLET | Freq: Every day | ORAL | Status: DC
  Administered 2017-02-06 – 2017-02-11 (×6): 325 mg via ORAL
  Filled 2017-02-06 (×6): qty 1

## 2017-02-06 MED ORDER — CARVEDILOL 12.5 MG PO TABS
12.5000 mg | ORAL_TABLET | Freq: Two times a day (BID) | ORAL | Status: DC
  Administered 2017-02-06 – 2017-02-11 (×10): 12.5 mg via ORAL
  Filled 2017-02-06 (×10): qty 1

## 2017-02-06 MED ORDER — ATORVASTATIN CALCIUM 40 MG PO TABS
40.0000 mg | ORAL_TABLET | Freq: Every day | ORAL | Status: DC
  Administered 2017-02-07 – 2017-02-11 (×5): 40 mg via ORAL
  Filled 2017-02-06 (×6): qty 1

## 2017-02-06 MED ORDER — LATANOPROST 0.005 % OP SOLN
1.0000 [drp] | Freq: Every day | OPHTHALMIC | Status: DC
  Administered 2017-02-06 – 2017-02-10 (×5): 1 [drp] via OPHTHALMIC
  Filled 2017-02-06: qty 2.5

## 2017-02-06 MED ORDER — SODIUM CHLORIDE 0.9 % IV SOLN: 250.0000 mL | INTRAVENOUS | Status: DC | PRN

## 2017-02-06 NOTE — ED Notes (Signed)
Attempted report to [place], [place]

## 2017-02-06 NOTE — Progress Notes (Signed)
Pt set up for bath

## 2017-02-06 NOTE — Care Management Note (Signed)
Case Management Note  Patient Details  Name: Adam Dennis MRN: [id number] Date of Birth: 08/08/1960  Subjective/Objective:   CHF                Action/Plan: Patient lives at home; PCP: [name], MD; has private insurance with Medicaid with prescription drug coverage; DME- home 02, CPAP machine; Patient could benefit from a disease management program for CHF; CM following for DCP.  Expected Discharge Date:    possibly 02/11/2017              Expected Discharge Plan:  [place]  Discharge planning Services  CM Consult  Status of Service:  In process, will continue to follow  Sherrilyn Rist [phone] 02/06/2017, 3:03 PM

## 2017-02-06 NOTE — ED Notes (Signed)
Stuck for labs  Only gotttenlight green [place]

## 2017-02-06 NOTE — ED Notes (Signed)
Pt going to xray then D35

## 2017-02-06 NOTE — ED Notes (Signed)
Walked in the hallway on the pulse ox  He sustained  A 93 % untile we started back up the hallway  When he reached his room his sats stayed 77-785  For approx  5 minutes or more then gradually increased to 99%

## 2017-02-06 NOTE — H&P (Signed)
Referring Physician:  KUNIO CUMMISKEY is an 56 y.o. male.                       Chief Complaint: Shortness of breath  HPI: 56 years old male with PMH of chronic systolic and diastolic heart failure, Mprbid obesity, hypertension, DM, II, obstructive sleep apnea, CKD, IV , Glaucoma, chronic hypoxic respiratory failured and hyperlipidemia has 1 week history of shortness of breath with 5 pounds of weight gain. He denies fever, cough or chest pain. Chest x-ray is positive for vascular congestion and BNP is elevated at 358.3.  Past Medical History:  Diagnosis Date  . CHF (congestive heart failure) ([place])   . Chronic combined systolic and diastolic heart failure ([place])   . CKD (chronic kidney disease) stage 4, GFR 15-29 ml/min ([facility])   . Diabetes mellitus without complication ([place])    TYPE 2  . Hypertension   . Hypertrophy of tonsils alone   . Obesity, unspecified   . Obstructive sleep apnea (adult) (pediatric)       Past Surgical History:  Procedure Laterality Date  . UMBILICAL HERNIA REPAIR      Family History  Problem Relation Age of Onset  . Hypertension Mother   . Diabetes Brother   . Diabetes Sister   . Cancer Maternal Aunt    Social History:  reports that he has never smoked. He has never used smokeless tobacco. He reports that he does not drink alcohol or use drugs.  Allergies: No Known Allergies   (Not in a hospital admission)  Results for orders placed or performed during the hospital encounter of 02/06/17 (from the past 48 hour(s))  Basic metabolic panel     Status: Abnormal   Collection Time: 02/06/17  2:32 AM  Result Value Ref Range   Sodium 139 135 - 145 mmol/L   Potassium 4.3 3.5 - 5.1 mmol/L   Chloride 97 (L) 101 - 111 mmol/L   CO2 36 (H) 22 - 32 mmol/L   Glucose, Bld 97 65 - 99 mg/dL   BUN 31 (H) 6 - 20 mg/dL   Creatinine, Ser 2.89 (H) 0.61 - 1.24 mg/dL   Calcium 8.5 (L) 8.9 - 10.3 mg/dL   GFR calc non Af Amer 23 (L) >60 mL/min   GFR calc Af Amer 27 (L) >60  mL/min    Comment: (NOTE) The eGFR has been calculated using the CKD EPI equation. This calculation has not been validated in all clinical situations. eGFR's persistently <60 mL/min signify possible Chronic Kidney Disease.    Anion gap 6 5 - 15  CBC     Status: Abnormal   Collection Time: 02/06/17  2:32 AM  Result Value Ref Range   WBC 5.9 4.0 - 10.5 K/uL   RBC 4.95 4.22 - 5.81 MIL/uL   Hemoglobin 12.4 (L) 13.0 - 17.0 g/dL   HCT 42.7 39.0 - 52.0 %   MCV 86.3 78.0 - 100.0 fL   MCH 25.1 (L) 26.0 - 34.0 pg   MCHC 29.0 (L) 30.0 - 36.0 g/dL   RDW 19.1 (H) 11.5 - 15.5 %   Platelets 215 150 - 400 K/uL  I-stat troponin, ED     Status: None   Collection Time: 02/06/17  2:56 AM  Result Value Ref Range   Troponin i, poc 0.01 0.00 - 0.08 ng/mL   Comment 3            Comment: Due to the release kinetics of  cTnI, a negative result within the first hours of the onset of symptoms does not rule out myocardial infarction with certainty. If myocardial infarction is still suspected, repeat the test at appropriate intervals.   Brain natriuretic peptide     Status: Abnormal   Collection Time: 02/06/17  3:53 AM  Result Value Ref Range   B Natriuretic Peptide 358.3 (H) 0.0 - 100.0 pg/mL   Dg Chest 2 View  Result Date: 02/06/2017 CLINICAL DATA:  Shortness of breath tonight. EXAM: CHEST  2 VIEW COMPARISON:  Radiographs 01/15/2017. Lung bases from abdominal CT 09/03/2015 FINDINGS: Borderline mild vascular congestion. Chronic eventration of left hemidiaphragm. Chronic rightward mediastinal shift, mediastinal contours are unchanged. No consolidation. Chronic left lung base atelectasis. No pleural fluid. No pneumothorax. Stable osseous structures. IMPRESSION: Borderline mild vascular congestion. Otherwise unchanged exam. Chronic elevation of left hemidiaphragm. Electronically Signed   By: Jeb Levering M.D.   On: 02/06/2017 04:19    Review Of Systems Constitutional: No fever, chills, weight loss or  gain. Eyes: No vision change, wears glasses. No discharge or pain. Ears: No hearing loss, No tinnitus. Respiratory: No asthma, COPD, pneumonias. Positive shortness of breath. No hemoptysis. Cardiovascular: No chest pain, palpitation, positive leg edema. Gastrointestinal: No nausea, vomiting, diarrhea, constipation. No GI bleed. No hepatitis. Genitourinary: No dysuria, hematuria, kidney stone. No incontinance. Neurological: No headache, stroke, seizures.  Psychiatry: No psych facility admission for anxiety, depression, suicide. No detox. Skin: No rash. Musculoskeletal: Positive joint pain, no fibromyalgia. No neck pain, back pain. Lymphadenopathy: No lymphadenopathy. Hematology: No anemia or easy bruising.   Blood pressure (!) 142/58, pulse 73, temperature 98.3 F (36.8 C), temperature source Oral, resp. rate (!) 22, SpO2 92 %. There is no height or weight on file to calculate BMI. BMI over 50 with height 5' 11" and weight 360 pounds. General appearance: alert, cooperative, appears stated age and no distress Head: Normocephalic, atraumatic. Eyes: Brown eyes, pink conjunctiva, corneas clear. PERRL, EOM's intact. Neck: No adenopathy, no carotid bruit, + JVD, supple, symmetrical, trachea midline and thyroid not enlarged. Resp: Clear to auscultation bilaterally. Cardio: Regular rate and rhythm, S1, S2 normal, II/VI systolic murmur, no click, rub or gallop GI: Soft, non-tender; bowel sounds normal; no organomegaly. Extremities: 1 + edema, cyanosis or clubbing. Skin: Warm and dry.  Neurologic: Alert and oriented X 3, normal strength. Normal coordination and gait.  Assessment/Plan Acute on chronic left heart systolic and diastolic failure Morbid obesity Essential hypertension DM, II Obstructive sleep apnea CKD, IV [place] Acute on chronic respiratory failure with hypoxia Hyperlipidemia  Admit IV lasix/home medications.  Birdie Riddle, MD  02/06/2017, 8:27 AM

## 2017-02-06 NOTE — ED Provider Notes (Signed)
[facility] DEPT Provider Note   CSN: [id number] Arrival date & time: 02/06/17  0219     History   Chief Complaint Chief Complaint  Patient presents with  . Shortness of Breath    HPI REBEL WILLCUTT is a 56 y.o. male with a hx of CHF, chronic respiratory failure (on oxygen 2lpm via [place] at home), OSA, obesity, HTN, NIDDM presents to the Emergency Department complaining of gradual, persistent, progressively worsening SOB and cough tonight waking him from sleep.  Pt reports he became nervous, prompting his visit. Pt reports he felt increasingly SOB at home tonight even with his oxygen and CPAP.  Pt denies recent illness, fever, chills, HA, CP, abd pain, N/V/D, syncope.  Pt reports taking medications as instructed.  Pt reports SOB is worse with exertion.  No alleviating factors.  Pt denies known leg swelling or pain.  No hx of DVT.   Pt reports weight today was 355lbs.  He reports last weight was 300lbs.  Pt reports he is not a smoker.    The history is provided by the patient and medical records. No language interpreter was used.    Past Medical History:  Diagnosis Date  . CHF (congestive heart failure) ([place])   . Chronic combined systolic and diastolic heart failure ([place])   . CKD (chronic kidney disease) stage 4, GFR 15-29 ml/min (HCC)   . Diabetes mellitus without complication ([place])    TYPE 2  . Hypertension   . Hypertrophy of tonsils alone   . Obesity, unspecified   . Obstructive sleep apnea (adult) (pediatric)     Patient Active Problem List   Diagnosis Date Noted  . Acute on chronic systolic (congestive) heart failure ([place]) 01/15/2017  . Right flank pain 12/22/2016  . Chronic respiratory failure ([place]) 09/19/2016  . Vitamin D insufficiency 12/03/2015  . Glaucoma of right eye 11/30/2015  . CKD (chronic kidney disease) stage 4, GFR 15-29 ml/min (HCC) 10/04/2015  . Erectile dysfunction 06/26/2015  . O2 dependent 04/05/2015  . Diabetic nephropathy associated with type 2  diabetes mellitus ([place]) 02/13/2015  . Diabetes mellitus type 2 in obese ([place]) 02/10/2015  . Chronic combined systolic and diastolic CHF (congestive heart failure) ([place]) 02/10/2015  . Depression 02/10/2015  . Tinea pedis 02/10/2015  . Onychomycosis 02/10/2015  . Obesity hypoventilation syndrome ([place]) 02/10/2015  . Essential hypertension, benign 08/16/2013  . Dry skin 02/26/2013  . Hyperlipidemia 11/14/2008  . Morbid obesity ([place]) 03/10/2007  . Obstructive sleep apnea 03/10/2007  . HYPERTROPHY, TONSILS 03/10/2007    Past Surgical History:  Procedure Laterality Date  . UMBILICAL HERNIA REPAIR         Home Medications    Prior to Admission medications   Medication Sig Start Date End Date Taking? Authorizing Provider  aspirin 325 MG tablet Take 325 mg by mouth daily.   Yes [provider]  atorvastatin (LIPITOR) 40 MG tablet Take 1 tablet (40 mg total) by mouth daily. 12/16/16  Yes Funches, Josalyn, MD  calcium carbonate (TUMS - DOSED IN MG ELEMENTAL CALCIUM) 500 MG chewable tablet Chew 1 tablet (200 mg of elemental calcium total) by mouth 2 (two) times daily. 11/27/14  Yes Dixie Dials, MD  carvedilol (COREG) 12.5 MG tablet Take 12.5 mg by mouth 2 (two) times daily. 01/18/17  Yes [provider]  furosemide (LASIX) 40 MG tablet Take 2 tablets (80 mg total) by mouth 2 (two) times daily. 12/22/16  Yes Funches, Josalyn, MD  hydrALAZINE (APRESOLINE) 50 MG tablet Take  0.5 tablets (25 mg total) by mouth 3 (three) times daily. 12/22/16  Yes Funches, Josalyn, MD  isosorbide mononitrate (IMDUR) 30 MG 24 hr tablet Take 1 tablet (30 mg total) by mouth daily. 12/22/16  Yes Funches, Josalyn, MD  lisinopril (PRINIVIL,ZESTRIL) 20 MG tablet Take 1 tablet (20 mg total) by mouth daily. 12/16/16  Yes Funches, Josalyn, MD  nitroGLYCERIN (NITROSTAT) 0.4 MG SL tablet Place 1 tablet (0.4 mg total) under the tongue every 5 (five) minutes x 3 doses as needed for chest pain. 12/17/12  Yes Dixie Dials, MD  potassium chloride (K-DUR) 10 MEQ tablet Take 10 mEq by mouth daily.   Yes [provider]  TRAVATAN Z 0.004 % SOLN ophthalmic solution Place 1 drop into both eyes at bedtime. 11/01/15  Yes [provider]  Blood Glucose Monitoring Suppl (ACCU-CHEK AVIVA) device Use as instructed daily. 02/05/17   [name], MD  carvedilol (COREG) 6.25 MG tablet Take 1 tablet (6.25 mg total) by mouth 2 (two) times daily. Patient not taking: Reported on 02/06/2017 01/24/17   [name], MD  glucose blood (ACCU-CHEK AVIVA) test strip Use as instructed daily 02/05/17   [name], MD  Lancet Devices [facility]) lancets Use as instructed daily. 02/05/17   [name], MD    Family History Family History  Problem Relation Age of Onset  . Hypertension Mother   . Diabetes Brother   . Diabetes Sister   . Cancer Maternal Aunt     Social History Social History  Substance Use Topics  . Smoking status: Never Smoker  . Smokeless tobacco: Never Used  . Alcohol use No     Allergies   Patient has no known allergies.   Review of Systems Review of Systems  Constitutional: Negative for appetite change, diaphoresis, fatigue, fever and unexpected weight change.  HENT: Negative for mouth sores.   Eyes: Negative for visual disturbance.  Respiratory: Positive for shortness of breath. Negative for cough, chest tightness and wheezing.   Cardiovascular: Negative for chest pain.  Gastrointestinal: Negative for abdominal pain, constipation, diarrhea, nausea and vomiting.  Endocrine: Negative for polydipsia, polyphagia and polyuria.  Genitourinary: Negative for dysuria, frequency, hematuria and urgency.  Musculoskeletal: Negative for back pain and neck stiffness.  Skin: Negative for rash.  Allergic/Immunologic: Negative for immunocompromised state.  Neurological: Negative for syncope, light-headedness and headaches.  Hematological: Does not bruise/bleed easily.    Psychiatric/Behavioral: Negative for sleep disturbance. The patient is not nervous/anxious.   All other systems reviewed and are negative.    Physical Exam Updated Vital Signs BP 140/88   Pulse 66   Temp 98.3 F (36.8 C) (Oral)   Resp (!) 32   SpO2 95%   Physical Exam  Constitutional: He appears well-developed and well-nourished. No distress.  Awake, alert, nontoxic appearance Obese  HENT:  Head: Normocephalic and atraumatic.  Mouth/Throat: Oropharynx is clear and moist. No oropharyngeal exudate.  Eyes: Conjunctivae are normal. No scleral icterus.  Neck: Normal range of motion. Neck supple.  Cardiovascular: Normal rate, regular rhythm and intact distal pulses.   Pulmonary/Chest: Effort normal. No accessory muscle usage. No respiratory distress. He has decreased breath sounds ( throughout). He has no wheezes.  Equal chest expansion Pt on 3lpm via [place]  Abdominal: Soft. Bowel sounds are normal. He exhibits no mass. There is no tenderness. There is no rebound and no guarding.  Musculoskeletal: Normal range of motion. He exhibits no edema.  No calf tenderness No pitting edema  Neurological: He is  alert.  Speech is clear and goal oriented Moves extremities without ataxia  Skin: Skin is warm and dry. He is not diaphoretic.  Psychiatric: He has a normal mood and affect.  Nursing note and vitals reviewed.    ED Treatments / Results  Labs (all labs ordered are listed, but only abnormal results are displayed) Labs Reviewed  BASIC METABOLIC PANEL - Abnormal; Notable for the following:       Result Value   Chloride 97 (*)    CO2 36 (*)    BUN 31 (*)    Creatinine, Ser 2.89 (*)    Calcium 8.5 (*)    GFR calc non Af Amer 23 (*)    GFR calc Af Amer 27 (*)    All other components within normal limits  CBC - Abnormal; Notable for the following:    Hemoglobin 12.4 (*)    MCH 25.1 (*)    MCHC 29.0 (*)    RDW 19.1 (*)    All other components within normal limits  BRAIN  NATRIURETIC PEPTIDE - Abnormal; Notable for the following:    B Natriuretic Peptide 358.3 (*)    All other components within normal limits  I-STAT TROPONIN, ED    EKG  EKG Interpretation  Date/Time:  Thursday February 06 2017 02:22:14 EDT Ventricular Rate:  84 PR Interval:  146 QRS Duration: 84 QT Interval:  378 QTC Calculation: 446 R Axis:   -7 Text Interpretation:  Sinus rhythm with marked sinus arrhythmia Interpretation limited secondary to artifact Abnormal ekg Confirmed by Ripley Fraise [id number]) on 02/06/2017 3:02:08 AM       Radiology Dg Chest 2 View  Result Date: 02/06/2017 CLINICAL DATA:  Shortness of breath tonight. EXAM: CHEST  2 VIEW COMPARISON:  Radiographs 01/15/2017. Lung bases from abdominal CT 09/03/2015 FINDINGS: Borderline mild vascular congestion. Chronic eventration of left hemidiaphragm. Chronic rightward mediastinal shift, mediastinal contours are unchanged. No consolidation. Chronic left lung base atelectasis. No pleural fluid. No pneumothorax. Stable osseous structures. IMPRESSION: Borderline mild vascular congestion. Otherwise unchanged exam. Chronic elevation of left hemidiaphragm. Electronically Signed   By: Jeb Levering M.D.   On: 02/06/2017 04:19    Procedures Procedures (including critical care time)  Medications Ordered in ED Medications  furosemide (LASIX) injection 40 mg (40 mg Intravenous Given 02/06/17 0723)     Initial Impression / Assessment and Plan / ED Course  I have reviewed the triage vital signs and the nursing notes.  Pertinent labs & imaging results that were available during my care of the patient were reviewed by me and considered in my medical decision making (see chart for details).  Clinical Course as of Feb 07 724  Thu Feb 06, 2017  0708 Pt becomes hypoxic with walking even on his oxygen  [HM]  0723 Discussed with Dr. Doylene Canard who will admit  [HM]    Clinical Course User Index [HM] Tc [place], Jarrett [place], [place]     Patient with a history of CHF presents tonight with shortness of breath. Hypoxia on arrival. Patient's oxygen increased from the 2 L/m to 3 with improvement in his oxygen saturations. He initially requested discharge home however after ambulation patient becomes hypoxic with tachypnea and shortness of breath. He will need admission. Suspect he is fluid overloaded based on vascular congestion on his chest x-ray. Discussed with Dr. Doylene Canard. He will admit.  Final Clinical Impressions(s) / ED Diagnoses   Final diagnoses:  Shortness of breath  Acute on chronic congestive heart failure, unspecified heart failure  type [facility])    New Prescriptions New Prescriptions   No medications on file     Agapito Games 02/06/17 Jeannetta Nap, MD 02/06/17 [phone]

## 2017-02-06 NOTE — ED Triage Notes (Signed)
Patient reports SOB with dry cough onset this evening , denies chest pain , history of CHF , oxygen 2 lpm/[place] , no fever or chills .

## 2017-02-07 LAB — BASIC METABOLIC PANEL
Anion gap: 3 — ABNORMAL LOW (ref 5–15)
BUN: 32 mg/dL — AB (ref 6–20)
CALCIUM: 8.3 mg/dL — AB (ref 8.9–10.3)
CHLORIDE: 99 mmol/L — AB (ref 101–111)
CO2: 37 mmol/L — AB (ref 22–32)
CREATININE: 3.1 mg/dL — AB (ref 0.61–1.24)
GFR calc Af Amer: 24 mL/min — ABNORMAL LOW (ref >60)
GFR calc non Af Amer: 21 mL/min — ABNORMAL LOW (ref >60)
GLUCOSE: 100 mg/dL — AB (ref 65–99)
Potassium: 4.6 mmol/L (ref 3.5–5.1)
Sodium: 139 mmol/L (ref 135–145)

## 2017-02-07 LAB — GLUCOSE, CAPILLARY
GLUCOSE-CAPILLARY: 161 mg/dL — AB (ref 65–99)
GLUCOSE-CAPILLARY: 106 mg/dL — AB (ref 65–99)
Glucose-Capillary: 89 mg/dL (ref 65–99)
Glucose-Capillary: 115 mg/dL — ABNORMAL HIGH (ref 65–99)

## 2017-02-07 LAB — URIC ACID: URIC ACID, SERUM: 8.4 mg/dL — AB (ref 4.4–7.6)

## 2017-02-07 MED ORDER — SODIUM CHLORIDE 0.9 % IV SOLN: 100.0000 mg | Freq: Every day | INTRAVENOUS | Status: DC

## 2017-02-07 MED ORDER — ALLOPURINOL 100 MG PO TABS
100.0000 mg | ORAL_TABLET | Freq: Every day | ORAL | Status: DC
Start: 2017-02-07 — End: 2017-02-11
  Administered 2017-02-07 – 2017-02-11 (×5): 100 mg via ORAL
  Filled 2017-02-07 (×5): qty 1

## 2017-02-07 MED ORDER — FUROSEMIDE 10 MG/ML IJ SOLN
40.0000 mg | Freq: Two times a day (BID) | INTRAMUSCULAR | Status: DC
  Administered 2017-02-07 – 2017-02-10 (×7): 40 mg via INTRAVENOUS
  Filled 2017-02-07 (×7): qty 4

## 2017-02-07 NOTE — Progress Notes (Signed)
Ref: [name], MD   Subjective:  Feeling little better. Shortness of breath continues on exertion. Afebrile.  Objective:  Vital Signs in the last 24 hours: Temp:  [97.5 F (36.4 C)-98.6 F (37 C)] 98 F (36.7 C) (09/14 1209) Pulse Rate:  [57-75] 69 (09/14 1544) Cardiac Rhythm: Normal sinus rhythm (09/14 0700) Resp:  [18-20] 20 (09/14 1209) BP: (98-152)/(45-84) 98/45 (09/14 1544) SpO2:  [93 %-100 %] 100 % (09/14 1209) Weight:  [158 kg (348 lb 4.8 oz)] 158 kg (348 lb 4.8 oz) (09/14 0427)  Physical Exam: BP Readings from Last 1 Encounters:  02/07/17 (!) 98/45    Wt Readings from Last 1 Encounters:  02/07/17 (!) 158 kg (348 lb 4.8 oz)    Weight change:  Body mass index is 48.58 kg/m. HEENT: [place]/AT, Eyes-Brown, PERL, EOMI, Conjunctiva-Pink, Sclera-Non-icteric Neck: + JVD, No bruit, Trachea midline. Lungs:  Clear, Bilateral. Cardiac:  Regular rhythm, normal S1 and S2, no S3. II/VI systolic murmur. Abdomen:  Soft, non-tender. BS present. Extremities:  Trace edema present. No cyanosis. No clubbing. CNS: AxOx3, Cranial nerves grossly intact, moves all 4 extremities.  Skin: Warm and dry.   Intake/Output from previous day: 09/13 0701 - 09/14 0700 In: 720 [P.O.:720] Out: 2770 [Urine:2770]    Lab Results: BMET    Component Value Date/Time   NA 139 02/07/2017 0508   NA 139 02/06/2017 0232   NA 147 (H) 01/24/2017 1022   NA 143 01/17/2017 0646   K 4.6 02/07/2017 0508   K 4.3 02/06/2017 0232   K 5.0 01/24/2017 1022   CL 99 (L) 02/07/2017 0508   CL 97 (L) 02/06/2017 0232   CL 100 01/24/2017 1022   CO2 37 (H) 02/07/2017 0508   CO2 36 (H) 02/06/2017 0232   CO2 32 (H) 01/24/2017 1022   GLUCOSE 100 (H) 02/07/2017 0508   GLUCOSE 97 02/06/2017 0232   GLUCOSE 105 (H) 01/24/2017 1022   GLUCOSE 108 (H) 01/17/2017 0646   BUN 32 (H) 02/07/2017 0508   BUN 31 (H) 02/06/2017 0232   BUN 28 (H) 01/24/2017 1022   BUN 28 (H) 01/17/2017 0646   CREATININE 3.10 (H) 02/07/2017 0508   CREATININE 2.73 (H) 02/06/2017 0935   CREATININE 2.89 (H) 02/06/2017 0232   CREATININE 2.89 (H) 11/30/2015 1250   CREATININE 3.06 (H) 09/28/2015 1746   CREATININE 2.29 (H) 06/26/2015 1047   CALCIUM 8.3 (L) 02/07/2017 0508   CALCIUM 8.5 (L) 02/06/2017 0232   CALCIUM 8.3 (L) 01/24/2017 1022   GFRNONAA 21 (L) 02/07/2017 0508   GFRNONAA 25 (L) 02/06/2017 0935   GFRNONAA 23 (L) 02/06/2017 0232   GFRNONAA 24 (L) 11/30/2015 1250   GFRNONAA 22 (L) 09/28/2015 1746   GFRNONAA 59 (L) 12/12/2014 1009   GFRAA 24 (L) 02/07/2017 0508   GFRAA 29 (L) 02/06/2017 0935   GFRAA 27 (L) 02/06/2017 0232   GFRAA 27 (L) 11/30/2015 1250   GFRAA 26 (L) 09/28/2015 1746   GFRAA 68 12/12/2014 1009   CBC    Component Value Date/Time   WBC 5.5 02/06/2017 0935   RBC 5.07 02/06/2017 0935   HGB 13.0 02/06/2017 0935   HCT 43.7 02/06/2017 0935   PLT 187 02/06/2017 0935   MCV 86.2 02/06/2017 0935   MCH 25.6 (L) 02/06/2017 0935   MCHC 29.7 (L) 02/06/2017 0935   RDW 19.0 (H) 02/06/2017 0935   LYMPHSABS 1.1 09/19/2016 0942   MONOABS 0.8 09/19/2016 0942   EOSABS 0.1 09/19/2016 0942   BASOSABS 0.0 09/19/2016  0942   HEPATIC Function Panel  Recent Labs  09/19/16 0942  PROT 7.0   HEMOGLOBIN A1C No components found for: HGA1C,  MPG CARDIAC ENZYMES Lab Results  Component Value Date   TROPONINI <0.03 02/06/2017   TROPONINI <0.03 02/06/2017   TROPONINI <0.03 02/06/2017   BNP No results for input(s): PROBNP in the last 8760 hours. TSH  Recent Labs  09/19/16 1306  TSH 1.392   CHOLESTEROL No results for input(s): CHOL in the last 8760 hours.  Scheduled Meds: . aspirin  325 mg Oral Daily  . atorvastatin  40 mg Oral Daily  . carvedilol  12.5 mg Oral BID  . furosemide  40 mg Intravenous BID  . heparin  5,000 Units Subcutaneous Q8H  . hydrALAZINE  25 mg Oral TID  . isosorbide mononitrate  30 mg Oral Daily  . latanoprost  1 drop Both Eyes QHS  . lisinopril  20 mg Oral Daily  . potassium chloride  10  mEq Oral Daily  . sodium chloride flush  3 mL Intravenous Q12H   Continuous Infusions: . sodium chloride     PRN Meds:.sodium chloride, ondansetron (ZOFRAN) IV, sodium chloride flush  Assessment/Plan: Acute on chronic left heart systolic and diastolic failure Morbid obesity Hypertension DM, II OSA CKD, IV Glaucoma Acute on chronic respiratory failure with hypoxia Hyperlipidemia  Continue diuresis. Increase activity. Home soon.   LOS: 1 day    Dixie Dials  MD  02/07/2017, 4:01 PM    Feeling little better. Shortness of breath on exertion

## 2017-02-07 NOTE — Progress Notes (Signed)
HR 56-60 and BP 107/45. Coreg held and Hydralazine given. Will continue to monitor.

## 2017-02-07 NOTE — Progress Notes (Signed)
Refused bed alarm. Will continue to monitor patient. 

## 2017-02-08 LAB — BASIC METABOLIC PANEL
ANION GAP: 5 (ref 5–15)
BUN: 33 mg/dL — ABNORMAL HIGH (ref 6–20)
CALCIUM: 8.2 mg/dL — AB (ref 8.9–10.3)
CO2: 37 mmol/L — AB (ref 22–32)
Chloride: 97 mmol/L — ABNORMAL LOW (ref 101–111)
Creatinine, Ser: 3.18 mg/dL — ABNORMAL HIGH (ref 0.61–1.24)
GFR calc Af Amer: 24 mL/min — ABNORMAL LOW (ref >60)
GFR, EST NON AFRICAN AMERICAN: 20 mL/min — AB (ref >60)
Glucose, Bld: 94 mg/dL (ref 65–99)
Potassium: 5 mmol/L (ref 3.5–5.1)
Sodium: 139 mmol/L (ref 135–145)

## 2017-02-08 LAB — GLUCOSE, CAPILLARY
GLUCOSE-CAPILLARY: 104 mg/dL — AB (ref 65–99)
Glucose-Capillary: 96 mg/dL (ref 65–99)
Glucose-Capillary: 104 mg/dL — ABNORMAL HIGH (ref 65–99)
Glucose-Capillary: 151 mg/dL — ABNORMAL HIGH (ref 65–99)

## 2017-02-08 NOTE — Progress Notes (Signed)
Subjective:  Patient denies any chest pain states breathing is slowly improving.  Objective:  Vital Signs in the last 24 hours: Temp:  [98 F (36.7 C)-98.3 F (36.8 C)] 98.1 F (36.7 C) (09/15 0500) Pulse Rate:  [53-71] 71 (09/15 0929) Resp:  [18-20] 18 (09/15 0500) BP: (98-133)/(45-85) 104/46 (09/15 0929) SpO2:  [97 %-100 %] 97 % (09/15 0929) Weight:  [158.1 kg (348 lb 9.6 oz)] 158.1 kg (348 lb 9.6 oz) (09/15 0500)  Intake/Output from previous day: 09/14 0701 - 09/15 0700 In: 960 [P.O.:960] Out: 2000 [Urine:2000] Intake/Output from this shift: Total I/O In: 360 [P.O.:360] Out: -   Physical Exam: Neck: no adenopathy, no carotid bruit and supple, symmetrical, trachea midline Lungs: Decreased  breath sound at bases Heart: regular rate and rhythm, S1, S2 normal and Soft systolic murmur and S3 gallop noted Abdomen: soft, non-tender; bowel sounds normal; no masses,  no organomegaly Extremities: extremities normal, atraumatic, no cyanosis or edema  Lab Results:  Recent Labs  02/06/17 0232 02/06/17 0935  WBC 5.9 5.5  HGB 12.4* 13.0  PLT 215 187    Recent Labs  02/07/17 0508 02/08/17 0618  NA 139 139  K 4.6 5.0  CL 99* 97*  CO2 37* 37*  GLUCOSE 100* 94  BUN 32* 33*  CREATININE 3.10* 3.18*    Recent Labs  02/06/17 1555 02/06/17 2017  TROPONINI <0.03 <0.03   Hepatic Function Panel No results for input(s): PROT, ALBUMIN, AST, ALT, ALKPHOS, BILITOT, BILIDIR, IBILI in the last 72 hours. No results for input(s): CHOL in the last 72 hours. No results for input(s): PROTIME in the last 72 hours.  Ima resolvingging: Imaging results have been reviewed and No results found.  Cardiac Studies:  Assessment/Plan:  Resolving Acute on chronic left heart systolic and diastolic failure Morbid obesity Hypertension DM, II OSA CKD, IV Glaucoma Acute on chronic respiratory failure with hypoxia Hyperlipidemia Plan Continue present management  Increase ambulation as  tolerated Check labs in a.m.  LOS: 2 days    Charolette Forward 02/08/2017, 11:10 AM

## 2017-02-08 NOTE — Plan of Care (Signed)
Problem: Activity: Goal: Capacity to carry out activities will improve Outcome: Progressing States shortness of breath is improving   Problem: Cardiac: Goal: Ability to achieve and maintain adequate cardiopulmonary perfusion will improve Outcome: Progressing O2 dependent at baseline

## 2017-02-08 NOTE — Progress Notes (Signed)
Patient without complaint during 7 a to 7 p shift, BP soft, Hydralazine held.  Patient continues to diurese, states overall he feels better than on admission.

## 2017-02-09 LAB — CBC
HEMATOCRIT: 40.6 % (ref 39.0–52.0)
HEMOGLOBIN: 11.7 g/dL — AB (ref 13.0–17.0)
MCH: 25.4 pg — ABNORMAL LOW (ref 26.0–34.0)
MCHC: 28.8 g/dL — ABNORMAL LOW (ref 30.0–36.0)
MCV: 88.3 fL (ref 78.0–100.0)
Platelets: 190 10*3/uL (ref 150–400)
RBC: 4.6 MIL/uL (ref 4.22–5.81)
RDW: 19.5 % — ABNORMAL HIGH (ref 11.5–15.5)
WBC: 5.4 10*3/uL (ref 4.0–10.5)

## 2017-02-09 LAB — BASIC METABOLIC PANEL
ANION GAP: 3 — AB (ref 5–15)
BUN: 34 mg/dL — ABNORMAL HIGH (ref 6–20)
CALCIUM: 8.1 mg/dL — AB (ref 8.9–10.3)
CO2: 37 mmol/L — ABNORMAL HIGH (ref 22–32)
Chloride: 99 mmol/L — ABNORMAL LOW (ref 101–111)
Creatinine, Ser: 3.12 mg/dL — ABNORMAL HIGH (ref 0.61–1.24)
GFR, EST AFRICAN AMERICAN: 24 mL/min — AB (ref >60)
GFR, EST NON AFRICAN AMERICAN: 21 mL/min — AB (ref >60)
Glucose, Bld: 92 mg/dL (ref 65–99)
POTASSIUM: 4.8 mmol/L (ref 3.5–5.1)
SODIUM: 139 mmol/L (ref 135–145)

## 2017-02-09 LAB — GLUCOSE, CAPILLARY
GLUCOSE-CAPILLARY: 91 mg/dL (ref 65–99)
GLUCOSE-CAPILLARY: 89 mg/dL (ref 65–99)
Glucose-Capillary: 130 mg/dL — ABNORMAL HIGH (ref 65–99)
Glucose-Capillary: 105 mg/dL — ABNORMAL HIGH (ref 65–99)

## 2017-02-09 LAB — BRAIN NATRIURETIC PEPTIDE: B NATRIURETIC PEPTIDE 5: 184.8 pg/mL — AB (ref 0.0–100.0)

## 2017-02-09 NOTE — Progress Notes (Signed)
Patient refused bed alarm. Will continue to monitor patient. 

## 2017-02-09 NOTE — Progress Notes (Signed)
Pt refuse CPAP for the night.

## 2017-02-09 NOTE — Progress Notes (Signed)
Subjective:  Patient still complains of shortness of breath with minimal exertion states overall feels better. Denies any chest pain. BNP trending down  Objective:  Vital Signs in the last 24 hours: Temp:  [97.6 F (36.4 C)-98.3 F (36.8 C)] 97.6 F (36.4 C) (09/16 0547) Pulse Rate:  [61-82] 69 (09/16 0547) Resp:  [16-18] 16 (09/16 0547) BP: (94-127)/(43-67) 127/57 (09/16 0547) SpO2:  [95 %-100 %] 96 % (09/16 0547) Weight:  [158.9 kg (350 lb 4.8 oz)] 158.9 kg (350 lb 4.8 oz) (09/16 0547)  Intake/Output from previous day: 09/15 0701 - 09/16 0700 In: 1080 [P.O.:1080] Out: 800 [Urine:800] Intake/Output from this shift: No intake/output data recorded.  Physical Exam: Neck: no adenopathy, no carotid bruit, no JVD and supple, symmetrical, trachea midline Lungs: Decreased breath sound at bases air entry improved Heart: regular rate and rhythm, S1, S2 normal and Soft systolic murmur and S3 gallop noted Abdomen: soft, non-tender; bowel sounds normal; no masses,  no organomegaly Extremities: extremities normal, atraumatic, no cyanosis or edema  Lab Results:  Recent Labs  02/06/17 0935 02/09/17 0434  WBC 5.5 5.4  HGB 13.0 11.7*  PLT 187 190    Recent Labs  02/08/17 0618 02/09/17 0434  NA 139 139  K 5.0 4.8  CL 97* 99*  CO2 37* 37*  GLUCOSE 94 92  BUN 33* 34*  CREATININE 3.18* 3.12*    Recent Labs  02/06/17 1555 02/06/17 2017  TROPONINI <0.03 <0.03   Hepatic Function Panel No results for input(s): PROT, ALBUMIN, AST, ALT, ALKPHOS, BILITOT, BILIDIR, IBILI in the last 72 hours. No results for input(s): CHOL in the last 72 hours. No results for input(s): PROTIME in the last 72 hours.  Imaging: Imaging results have been reviewed and No results found.  Cardiac Studies:  Assessment/Plan:  Resolving Acute on chronic left heart systolic and diastolic failure Morbid obesity Hypertension DM, II OSA CKD, IV Glaucoma Acute on chronic respiratory failure with  hypoxia Hyperlipidemia Plan Continue present management Increase ambulation as tolerated  Discharge home soon  LOS: 3 days    Charolette Forward 02/09/2017, 8:55 AM

## 2017-02-10 LAB — GLUCOSE, CAPILLARY
GLUCOSE-CAPILLARY: 126 mg/dL — AB (ref 65–99)
GLUCOSE-CAPILLARY: 122 mg/dL — AB (ref 65–99)
Glucose-Capillary: 91 mg/dL (ref 65–99)
Glucose-Capillary: 117 mg/dL — ABNORMAL HIGH (ref 65–99)

## 2017-02-10 MED ORDER — FUROSEMIDE 40 MG PO TABS
40.0000 mg | ORAL_TABLET | Freq: Two times a day (BID) | ORAL | Status: DC
  Administered 2017-02-10 – 2017-02-11 (×2): 40 mg via ORAL
  Filled 2017-02-10 (×2): qty 1

## 2017-02-10 NOTE — Progress Notes (Signed)
Ref: Adam Morale, MD   Subjective:  Positive exertional dyspnea. Afebrile.  Objective:  Vital Signs in the last 24 hours: Temp:  [97.9 F (36.6 C)-98.2 F (36.8 C)] 98.1 F (36.7 C) (09/17 0538) Pulse Rate:  [66-71] 70 (09/17 0918) Cardiac Rhythm: Normal sinus rhythm (09/17 0701) Resp:  [20] 20 (09/17 0538) BP: (100-141)/(53-90) 140/70 (09/17 0918) SpO2:  [93 %-98 %] 93 % (09/17 0538) Weight:  [158.8 kg (350 lb 1.6 oz)] 158.8 kg (350 lb 1.6 oz) (09/17 0500)  Physical Exam: BP Readings from Last 1 Encounters:  02/10/17 140/70    Wt Readings from Last 1 Encounters:  02/10/17 (!) 158.8 kg (350 lb 1.6 oz)    Weight change: -0.091 kg (-3.2 oz) Body mass index is 48.83 kg/m. HEENT: [place]/AT, Eyes-Brown, PERL, EOMI, Conjunctiva-Pink, Sclera-Non-icteric Neck: No JVD, No bruit, Trachea midline. Lungs:  Clear, Bilateral. Cardiac:  Regular rhythm, normal S1 and S2, no S3. II/VI systolic murmur. Abdomen:  Soft, non-tender. BS present. Extremities:  Trace edema present. No cyanosis. No clubbing. CNS: AxOx3, Cranial nerves grossly intact, moves all 4 extremities.  Skin: Warm and dry.   Intake/Output from previous day: 09/16 0701 - 09/17 0700 In: 720 [P.O.:720] Out: 2630 [Urine:2630]    Lab Results: BMET    Component Value Date/Time   NA 139 02/09/2017 0434   NA 139 02/08/2017 0618   NA 139 02/07/2017 0508   NA 147 (H) 01/24/2017 1022   K 4.8 02/09/2017 0434   K 5.0 02/08/2017 0618   K 4.6 02/07/2017 0508   CL 99 (L) 02/09/2017 0434   CL 97 (L) 02/08/2017 0618   CL 99 (L) 02/07/2017 0508   CO2 37 (H) 02/09/2017 0434   CO2 37 (H) 02/08/2017 0618   CO2 37 (H) 02/07/2017 0508   GLUCOSE 92 02/09/2017 0434   GLUCOSE 94 02/08/2017 0618   GLUCOSE 100 (H) 02/07/2017 0508   BUN 34 (H) 02/09/2017 0434   BUN 33 (H) 02/08/2017 0618   BUN 32 (H) 02/07/2017 0508   BUN 28 (H) 01/24/2017 1022   CREATININE 3.12 (H) 02/09/2017 0434   CREATININE 3.18 (H) 02/08/2017 0618   CREATININE  3.10 (H) 02/07/2017 0508   CREATININE 2.89 (H) 11/30/2015 1250   CREATININE 3.06 (H) 09/28/2015 1746   CREATININE 2.29 (H) 06/26/2015 1047   CALCIUM 8.1 (L) 02/09/2017 0434   CALCIUM 8.2 (L) 02/08/2017 0618   CALCIUM 8.3 (L) 02/07/2017 0508   GFRNONAA 21 (L) 02/09/2017 0434   GFRNONAA 20 (L) 02/08/2017 0618   GFRNONAA 21 (L) 02/07/2017 0508   GFRNONAA 24 (L) 11/30/2015 1250   GFRNONAA 22 (L) 09/28/2015 1746   GFRNONAA 59 (L) 12/12/2014 1009   GFRAA 24 (L) 02/09/2017 0434   GFRAA 24 (L) 02/08/2017 0618   GFRAA 24 (L) 02/07/2017 0508   GFRAA 27 (L) 11/30/2015 1250   GFRAA 26 (L) 09/28/2015 1746   GFRAA 68 12/12/2014 1009   CBC    Component Value Date/Time   WBC 5.4 02/09/2017 0434   RBC 4.60 02/09/2017 0434   HGB 11.7 (L) 02/09/2017 0434   HCT 40.6 02/09/2017 0434   PLT 190 02/09/2017 0434   MCV 88.3 02/09/2017 0434   MCH 25.4 (L) 02/09/2017 0434   MCHC 28.8 (L) 02/09/2017 0434   RDW 19.5 (H) 02/09/2017 0434   LYMPHSABS 1.1 09/19/2016 0942   MONOABS 0.8 09/19/2016 0942   EOSABS 0.1 09/19/2016 0942   BASOSABS 0.0 09/19/2016 0942   HEPATIC Function Panel  Recent Labs  09/19/16 0942  PROT 7.0   HEMOGLOBIN A1C No components found for: HGA1C,  MPG CARDIAC ENZYMES Lab Results  Component Value Date   TROPONINI <0.03 02/06/2017   TROPONINI <0.03 02/06/2017   TROPONINI <0.03 02/06/2017   BNP No results for input(s): PROBNP in the last 8760 hours. TSH  Recent Labs  09/19/16 1306  TSH 1.392   CHOLESTEROL No results for input(s): CHOL in the last 8760 hours.  Scheduled Meds: . allopurinol  100 mg Oral Daily  . aspirin  325 mg Oral Daily  . atorvastatin  40 mg Oral Daily  . carvedilol  12.5 mg Oral BID  . furosemide  40 mg Oral BID  . heparin  5,000 Units Subcutaneous Q8H  . hydrALAZINE  25 mg Oral TID  . isosorbide mononitrate  30 mg Oral Daily  . latanoprost  1 drop Both Eyes QHS  . sodium chloride flush  3 mL Intravenous Q12H   Continuous Infusions: .  sodium chloride     PRN Meds:.sodium chloride, ondansetron (ZOFRAN) IV, sodium chloride flush  Assessment/Plan: Acute on chronic left heart systolic and diastolic heart failure. Morbid obesity Hypertension DM, II OSA CKD, IV Glaucoma Acute on chronic respiratory failure with hypoxia Hyperlipidemia  Increase ambulation. Change to PO lasix. Home in AM if stable.    LOS: 4 days    Dixie Dials  MD  02/10/2017, 10:24 AM

## 2017-02-10 NOTE — Progress Notes (Signed)
Pt is stable during AM shift, diuresing well, no any specific complain of CP and SOB, will continue to monitor the patient

## 2017-02-11 LAB — GLUCOSE, CAPILLARY
GLUCOSE-CAPILLARY: 89 mg/dL (ref 65–99)
Glucose-Capillary: 75 mg/dL (ref 65–99)

## 2017-02-11 MED ORDER — FUROSEMIDE 40 MG PO TABS: 40.0000 mg | ORAL_TABLET | Freq: Two times a day (BID) | ORAL | Status: DC

## 2017-02-11 MED ORDER — ALLOPURINOL 100 MG PO TABS: 100.0000 mg | ORAL_TABLET | Freq: Every day | ORAL | 6 refills | Status: DC

## 2017-02-11 NOTE — Discharge Summary (Signed)
Physician Discharge Summary  Patient ID: Adam Dennis MRN: [id number] DOB/AGE: [birth year] 56 y.o.  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 Active Problems:   Acute systolic heart failure (HCC)   Morbid obesity   Obstructive sleep apnea   Essential hypertension   Diabetes mellitus type 2   CAD stage IV   Glaucoma   Acute on chronic respiratory failure with hypoxia   Hyperlipidemia  Discharged Condition: fair  Hospital Course: 56 year old male with past medical history of chronic systolic and diastolic heart failure, morbid obesity, hypertension, type 2 diabetes mellitus, obstructive sleep apnea, stage IV chronic kidney disease, glaucoma, chronic hypoxic respiratory failure and hyperlipidemia had one-week history of shortness of breath with 5 pounds of weight gain. His a chest x-ray was positive for vascular congestion and BNP was elevated at 358.3. He responded to IV Lasix and was given instructions on diet activity and medication compliance. He will be seen by his primary care physician in one week and by me in 1 week.  Consults: cardiology  Significant Diagnostic Studies: labs: Near normal electrolytes but elevated BUN of 31 and creatinine of 2.38. CBC was near normal with Hgb of 12.4. BNP was 358.3 on admission and 184.8 two days ago. Blood sugars were near normal.  EKG-NSR.  Chest x-ray: Mild vascular congestion. Chronic elevation of left hemidiaphragm.  Treatments: cardiac meds: Aspirin, atorvastatin, carvedilol, hydralazine and isosorbide.  Discharge  Exam: Blood pressure 125/81, pulse 63, temperature 98.5 F (36.9 C), temperature source Oral, resp. rate 18, height 5\' 11"  (1.803 m), weight (!) 158.1 kg (348 lb 9.6 oz), SpO2 100 %. General appearance: alert, cooperative and appears stated age. Head: Normocephalic, atraumatic. Eyes: Brown eyes, pink conjunctiva, corneas clear. PERRL, EOM's intact.  Neck: No adenopathy, no carotid bruit, no JVD, supple, symmetrical, trachea midline and thyroid not enlarged. Resp: Clear to auscultation bilaterally. Cardio: Regular rate and rhythm, S1, S2 normal, II/VI systolic murmur, no click, rub or gallop. GI: Soft, non-tender; bowel sounds normal; no organomegaly. Extremities: No edema, cyanosis or clubbing. Skin: Warm and dry.  Neurologic: Alert and oriented X 3, normal strength and tone. Normal coordination and gait.  Disposition: 01-Home or Self Care   Allergies as of 02/11/2017   No Known Allergies     Medication List    STOP taking these medications   lisinopril 20 MG tablet Commonly known as:  PRINIVIL,ZESTRIL   potassium chloride 10 MEQ tablet Commonly known as:  K-DUR     TAKE these medications   ACCU-CHEK AVIVA device Use as instructed daily.   accu-chek softclix lancets Use as instructed daily.   allopurinol 100 MG tablet Commonly known as:  ZYLOPRIM Take 1 tablet (100 mg total) by mouth daily.   aspirin 325 MG tablet Take 325 mg by mouth daily.   atorvastatin 40 MG tablet Commonly known as:  LIPITOR Take 1 tablet (40 mg total) by mouth daily.   calcium carbonate 500 MG chewable tablet Commonly known as:  TUMS - dosed in mg elemental calcium Chew 1 tablet (200 mg of elemental calcium total) by mouth 2 (two) times daily.   carvedilol 12.5 MG tablet Commonly known as:  COREG Take 12.5 mg  by mouth 2 (two) times daily.   furosemide 40 MG tablet Commonly known as:  LASIX Take 1 tablet (40 mg total) by mouth 2 (two) times daily. What changed:  how much to take    glucose blood test strip Commonly known as:  ACCU-CHEK AVIVA Use as instructed daily   hydrALAZINE 50 MG tablet Commonly known as:  APRESOLINE Take 0.5 tablets (25 mg total) by mouth 3 (three) times daily.   isosorbide mononitrate 30 MG 24 hr tablet Commonly known as:  IMDUR Take 1 tablet (30 mg total) by mouth daily.   nitroGLYCERIN 0.4 MG SL tablet Commonly known as:  NITROSTAT Place 1 tablet (0.4 mg total) under the tongue every 5 (five) minutes x 3 doses as needed for chest pain.   TRAVATAN Z 0.004 % Soln ophthalmic solution Generic drug:  Travoprost (BAK Free) Place 1 drop into both eyes at bedtime.            Discharge Care Instructions        Start     Ordered   02/11/17 0000  allopurinol (ZYLOPRIM) 100 MG tablet  Daily     02/11/17 0926   02/11/17 0000  furosemide (LASIX) 40 MG tablet  2 times daily     02/11/17 4166     Follow-up Information    [name], MD. Schedule an appointment as soon as possible for a visit in 1 week(s).   Specialty:  Family Medicine Contact information: [place] [address] [phone]        Dixie Dials, MD. Schedule an appointment as soon as possible for a visit in 1 week(s).   Specialty:  Cardiology Contact information: [place] [address] [phone]           Signed: Birdie Riddle 02/11/2017, 9:27 AM

## 2017-02-11 NOTE — Hospital Discharge Follow-Up (Signed)
The patient is known to [facility].   An appointment has been scheduled for 02/17/17 @ 0945 and the information has been placed on the AVS. Voicemail  message update left for Olga Coaster, RN CM.   This CM spoke to the patient and he stated that [facility] went to his home yesterday to pick him up for an assessment.  Provided him with the contact # for [facility] to call to schedule another assessment.   He has O2 and CPAP at home.  He was recently assessed for a portable O2 concentrator but did not qualify.  He has continued to work at the [employer] when he is able.

## 2017-02-12 ENCOUNTER — Telehealth: Payer: Self-pay

## 2017-02-12 NOTE — Telephone Encounter (Signed)
[facility] Post-discharge Follow-Up Phone Call:  Date of Discharge: 02/11/17 Principal Discharge Diagnosis(es):  Acute on chronic systolic and diastolic heart failure, HTN, DM, CKD Post-discharge Communication: (Clearly document all attempts clearly and date contact made)  Call placed to the patient/  Call Completed: Yes                    With Whom: Patient Interpreter Needed: No     Please check all that apply:  X  Patient is knowledgeable of his/her condition(s) and/or treatment. X  Patient is caring for self at home. - he stated that he is feeling " okay."  He said that his wife can assist if needed. He also works at the [employer].  ? Patient is receiving assist at home from family and/or caregiver. Family and/or caregiver is knowledgeable of patient's condition(s) and/or treatment. ? Patient is receiving home health services. If so, name of agency.     Medication Reconciliation:  X  Medication list reviewed with patient. - he stated that he has the discharge instructions but they were not available for review during this CM call and he said that he did not need to review the list.  Reminded him to check the instructions before taking his medication as there are some changes.  Those changes were discussed - start taking allopurinol, stop lisinopril and potassium and note that there is a change in how he has been taking the lasix. X  Patient obtained all discharge medications. If not, why? - he said that he has not picked up his medications or the glucometer and plans to pick them up today.  He noted that he did not have any questions about his medications.    Activities of Daily Living:  ? Independent X  Needs assist (describe; ? home DME used) - uses O2 @ 2L continuously. He also explained that he has a CPAP machine at home and his mask may be leaking. He was not able to read the label on the CPAP noting the DME company because he did not have his glasses. He  stated that he has owned the CPAP for many years. Instructed him to obtain the name of the DME company and bring that information to the appointment at [facility] on 02/17/17 and we will see if we can assist him obtaining information about the CPAP equipment/supplies that he has.  ? [employer] (describe, ? home DME used)   Community resources in place for patient:  X  P4CC- This CM called Margarette Canada # [phone] to inquire about the status of the tele-monitoring. She stated that she went to the home on 02/10/17 but he was in the hospital. She noted that she will call him today and attempt to install the equipment tomorrow.  ? Home Health/Home DME ? Assisted Living ? Support Group          Patient Education: reminded the patient that he needs to call medicaid for a transportation assessment. He stated that he has the # for medicaid. Explained that the medicaid rides to medical appointments are free.  He has also been referred to [facility] and needs to contact [facility] to determine if he missed an assessment appointment and would then need to reschedule that appointment.      Questions/Concerns discussed:  Reminded him of his appointment at [facility] on 02/17/17 @ 0945. He said that he may have transportation. Instructed him to call the clinic if he needs transportation and cab  transport will be arranged for him.

## 2017-02-12 NOTE — Telephone Encounter (Signed)
Call received from the patient. He stated that he found his discharge medication list and wanted to review.  It was determined that the list he had was not from 02/11/17 and it was an old list.  The list was reviewed in detail and he repeated all changes correctly and wrote them down. He noted the change he is to make with the lasix and that he is to stop taking potassium and lisinopril. He still has to pick up the allopurinol at the pharmacy today and he added that to his list.  He also needs to pick up the glucometer.  He was able to spell each medication as the list was reviewed and correctly state the dose and frequency.  He also noted that he spoke to [place] with [facility] and she will be coming tomorrow to set up the tele-monitoring.

## 2017-02-17 ENCOUNTER — Encounter (HOSPITAL_COMMUNITY): Payer: Self-pay | Admitting: *Deleted

## 2017-02-17 ENCOUNTER — Inpatient Hospital Stay: Payer: Medicaid Other | Admitting: Family Medicine

## 2017-02-17 ENCOUNTER — Emergency Department (HOSPITAL_COMMUNITY)
Admission: EM | Admit: 2017-02-17 | Discharge: 2017-02-17 | Disposition: A | Discharge status: Home / Self Care | Payer: Medicaid Other | Attending: Emergency Medicine | Admitting: Emergency Medicine

## 2017-02-17 ENCOUNTER — Emergency Department (HOSPITAL_COMMUNITY): Payer: Medicaid Other

## 2017-02-17 ENCOUNTER — Other Ambulatory Visit: Payer: Self-pay

## 2017-02-17 DIAGNOSIS — I130 Hypertensive heart and chronic kidney disease with heart failure and stage 1 through stage 4 chronic kidney disease, or unspecified chronic kidney disease: Secondary | ICD-10-CM | POA: Diagnosis present

## 2017-02-17 DIAGNOSIS — J181 Lobar pneumonia, unspecified organism: Secondary | ICD-10-CM | POA: Diagnosis not present

## 2017-02-17 DIAGNOSIS — J189 Pneumonia, unspecified organism: Secondary | ICD-10-CM

## 2017-02-17 DIAGNOSIS — E1140 Type 2 diabetes mellitus with diabetic neuropathy, unspecified: Secondary | ICD-10-CM | POA: Diagnosis not present

## 2017-02-17 DIAGNOSIS — N184 Chronic kidney disease, stage 4 (severe): Secondary | ICD-10-CM | POA: Insufficient documentation

## 2017-02-17 DIAGNOSIS — Z79899 Other long term (current) drug therapy: Secondary | ICD-10-CM | POA: Diagnosis not present

## 2017-02-17 DIAGNOSIS — H538 Other visual disturbances: Secondary | ICD-10-CM | POA: Insufficient documentation

## 2017-02-17 DIAGNOSIS — I5042 Chronic combined systolic (congestive) and diastolic (congestive) heart failure: Secondary | ICD-10-CM | POA: Insufficient documentation

## 2017-02-17 DIAGNOSIS — I10 Essential (primary) hypertension: Secondary | ICD-10-CM

## 2017-02-17 LAB — CBC
HCT: 39.5 % (ref 39.0–52.0)
HEMOGLOBIN: 11.5 g/dL — AB (ref 13.0–17.0)
MCH: 25.7 pg — ABNORMAL LOW (ref 26.0–34.0)
MCHC: 29.1 g/dL — AB (ref 30.0–36.0)
MCV: 88.4 fL (ref 78.0–100.0)
Platelets: 143 10*3/uL — ABNORMAL LOW (ref 150–400)
RBC: 4.47 MIL/uL (ref 4.22–5.81)
RDW: 19.3 % — AB (ref 11.5–15.5)
WBC: 4.9 10*3/uL (ref 4.0–10.5)

## 2017-02-17 LAB — COMPREHENSIVE METABOLIC PANEL
ALK PHOS: 33 U/L — AB (ref 38–126)
ALT: 15 U/L — AB (ref 17–63)
AST: 18 U/L (ref 15–41)
Albumin: 2.9 g/dL — ABNORMAL LOW (ref 3.5–5.0)
Anion gap: 6 (ref 5–15)
BILIRUBIN TOTAL: 0.9 mg/dL (ref 0.3–1.2)
BUN: 32 mg/dL — AB (ref 6–20)
CALCIUM: 8.3 mg/dL — AB (ref 8.9–10.3)
CO2: 36 mmol/L — ABNORMAL HIGH (ref 22–32)
CREATININE: 2.99 mg/dL — AB (ref 0.61–1.24)
Chloride: 99 mmol/L — ABNORMAL LOW (ref 101–111)
GFR calc Af Amer: 26 mL/min — ABNORMAL LOW (ref >60)
GFR, EST NON AFRICAN AMERICAN: 22 mL/min — AB (ref >60)
Glucose, Bld: 118 mg/dL — ABNORMAL HIGH (ref 65–99)
Potassium: 4.3 mmol/L (ref 3.5–5.1)
Sodium: 141 mmol/L (ref 135–145)
TOTAL PROTEIN: 7 g/dL (ref 6.5–8.1)

## 2017-02-17 LAB — CBG MONITORING, ED: GLUCOSE-CAPILLARY: 110 mg/dL — AB (ref 65–99)

## 2017-02-17 LAB — DIFFERENTIAL
BASOS ABS: 0 10*3/uL (ref 0.0–0.1)
Basophils Relative: 0 %
Eosinophils Absolute: 0 10*3/uL (ref 0.0–0.7)
Eosinophils Relative: 1 %
LYMPHS ABS: 1 10*3/uL (ref 0.7–4.0)
Lymphocytes Relative: 19 %
MONOS PCT: 19 %
Monocytes Absolute: 0.9 10*3/uL (ref 0.1–1.0)
NEUTROS ABS: 3 10*3/uL (ref 1.7–7.7)
Neutrophils Relative %: 61 %

## 2017-02-17 LAB — I-STAT CHEM 8, ED
BUN: 36 mg/dL — ABNORMAL HIGH (ref 6–20)
CALCIUM ION: 1.09 mmol/L — AB (ref 1.15–1.40)
CHLORIDE: 97 mmol/L — AB (ref 101–111)
Creatinine, Ser: 2.9 mg/dL — ABNORMAL HIGH (ref 0.61–1.24)
GLUCOSE: 115 mg/dL — AB (ref 65–99)
HCT: 41 % (ref 39.0–52.0)
Hemoglobin: 13.9 g/dL (ref 13.0–17.0)
Potassium: 4.4 mmol/L (ref 3.5–5.1)
Sodium: 143 mmol/L (ref 135–145)
TCO2: 36 mmol/L — ABNORMAL HIGH (ref 22–32)

## 2017-02-17 LAB — APTT: APTT: 32 s (ref 24–36)

## 2017-02-17 LAB — PROTIME-INR
INR: 1.07
Prothrombin Time: 13.8 seconds (ref 11.4–15.2)

## 2017-02-17 LAB — BRAIN NATRIURETIC PEPTIDE: B NATRIURETIC PEPTIDE 5: 341.7 pg/mL — AB (ref 0.0–100.0)

## 2017-02-17 MED ORDER — DOXYCYCLINE HYCLATE 100 MG PO CAPS: 100.0000 mg | ORAL_CAPSULE | Freq: Two times a day (BID) | ORAL | 0 refills | Status: AC

## 2017-02-17 NOTE — Progress Notes (Signed)
CSW informed RN CM that pt is in need of oxygen tank at this time. There are no further CSW needs. CSW signing off.     Virgie Dad Sebastian Lurz, MSW, [facility] Clinical Social Worker [phone]

## 2017-02-17 NOTE — ED Triage Notes (Signed)
Pt is here about high blood pressure reading this am that was 189/126.  Pt has taken his medications for bp.  No other symptoms.  Pt states he has blurry vision at least since last night.  Pt is diabetic and states cbg was 80 something this am.

## 2017-02-17 NOTE — ED Provider Notes (Signed)
[facility] DEPT Provider Note   CSN: [id number] Arrival date & time: 02/17/17  6237     History   Chief Complaint Chief Complaint  Patient presents with  . Hypertension  . Eye Problem    blurry vision.    HPI Adam Dennis is a 56 y.o. male hypertension and intermittent blurry vision that began this morning. Patient ports that he woke up this morning and felt slightly dizzy. He reports that he checked his blood pressure with his home blood pressure machine and noted that his blood pressure was 186/126. Patient reports that he took his medications and immediately rechecked his blood pressure which was still elevated. Patient states he became very worried about the blood pressure prompting ED visit. Patient also reports he has had intermittent blurry vision that has been ongoing for several months. He has not been evaluated for these symptoms. Patient reports that he had some blurry vision last night but currently denies any vision changes. Patient reports that he is on 4 L of O2 at baseline. He has not had any increase in his O2 requirements. Patient does report that he sleeps sitting up but states that this is not new for him. He denies any PND, orthopnea, or lateral lower extremity edema, cough, fever, chest pain, abdominal pain, nausea/vomiting, headache, SOB, numbness/weakness of his arms or legs.  The history is provided by the patient.    Past Medical History:  Diagnosis Date  . Arthritis    "back" (02/06/2017)  . CHF (congestive heart failure) ([place])   . Chronic combined systolic and diastolic heart failure ([place])   . CKD (chronic kidney disease) stage 4, GFR 15-29 ml/min (HCC)   . High cholesterol   . Hypertension   . Hypertrophy of tonsils alone   . Myocardial infarction [facility])    "small one; ~ 2008" (02/06/2017)  . Obesity, unspecified   . On home oxygen therapy    "2-4L; 24/7" (02/06/2017)  . OSA on CPAP   . Type II diabetes mellitus [facility])     Patient Active  Problem List   Diagnosis Date Noted  . Acute systolic heart failure ([place]) 02/06/2017  . Acute on chronic systolic (congestive) heart failure ([place]) 01/15/2017  . Right flank pain 12/22/2016  . Chronic respiratory failure ([place]) 09/19/2016  . Vitamin D insufficiency 12/03/2015  . Glaucoma of right eye 11/30/2015  . CKD (chronic kidney disease) stage 4, GFR 15-29 ml/min (HCC) 10/04/2015  . Erectile dysfunction 06/26/2015  . O2 dependent 04/05/2015  . Diabetic nephropathy associated with type 2 diabetes mellitus ([place]) 02/13/2015  . Diabetes mellitus type 2 in obese ([place]) 02/10/2015  . Chronic combined systolic and diastolic CHF (congestive heart failure) ([place]) 02/10/2015  . Depression 02/10/2015  . Tinea pedis 02/10/2015  . Onychomycosis 02/10/2015  . Obesity hypoventilation syndrome ([place]) 02/10/2015  . Essential hypertension, benign 08/16/2013  . Dry skin 02/26/2013  . Hyperlipidemia 11/14/2008  . Morbid obesity ([place]) 03/10/2007  . Obstructive sleep apnea 03/10/2007  . HYPERTROPHY, TONSILS 03/10/2007    Past Surgical History:  Procedure Laterality Date  . HERNIA REPAIR    . UMBILICAL HERNIA REPAIR         Home Medications    Prior to Admission medications   Medication Sig Start Date End Date Taking? Authorizing Provider  allopurinol (ZYLOPRIM) 100 MG tablet Take 1 tablet (100 mg total) by mouth daily. 02/11/17  Yes Dixie Dials, MD  aspirin 325 MG tablet Take 325 mg by mouth daily.   Yes  [provider]  Blood Glucose Monitoring Suppl (ACCU-CHEK AVIVA) device Use as instructed daily. 02/05/17  Yes [name], MD  calcium carbonate (TUMS - DOSED IN MG ELEMENTAL CALCIUM) 500 MG chewable tablet Chew 1 tablet (200 mg of elemental calcium total) by mouth 2 (two) times daily. 11/27/14  Yes Dixie Dials, MD  carvedilol (COREG) 6.25 MG tablet Take 6.25 mg by mouth 2 (two) times daily. 01/24/17  Yes [provider]  furosemide (LASIX) 40 MG tablet Take 1 tablet (40 mg  total) by mouth 2 (two) times daily. 02/11/17  Yes Dixie Dials, MD  glucose blood (ACCU-CHEK AVIVA) test strip Use as instructed daily 02/05/17  Yes [name], MD  hydrALAZINE (APRESOLINE) 50 MG tablet Take 0.5 tablets (25 mg total) by mouth 3 (three) times daily. 12/22/16  Yes Funches, Josalyn, MD  isosorbide mononitrate (IMDUR) 30 MG 24 hr tablet Take 1 tablet (30 mg total) by mouth daily. 12/22/16  Yes Boykin Nearing, MD  Lancet Devices [facility]) lancets Use as instructed daily. 02/05/17  Yes [name], MD  lisinopril (PRINIVIL,ZESTRIL) 20 MG tablet Take 20 mg by mouth daily. 01/18/17  Yes [provider]  nitroGLYCERIN (NITROSTAT) 0.4 MG SL tablet Place 1 tablet (0.4 mg total) under the tongue every 5 (five) minutes x 3 doses as needed for chest pain. 12/17/12  Yes Dixie Dials, MD  NON FORMULARY 1 each by Other route at bedtime. CPAP MACHINE   Yes [provider]  OXYGEN Inhale 4 L into the lungs daily.   Yes [provider]  TRAVATAN Z 0.004 % SOLN ophthalmic solution Place 1 drop into both eyes at bedtime. 11/01/15  Yes [provider]  atorvastatin (LIPITOR) 40 MG tablet Take 1 tablet (40 mg total) by mouth daily. 12/16/16   Funches, Adriana Mccallum, MD  carvedilol (COREG) 12.5 MG tablet Take 12.5 mg by mouth 2 (two) times daily. 01/18/17   [provider]  doxycycline (VIBRAMYCIN) 100 MG capsule Take 1 capsule (100 mg total) by mouth 2 (two) times daily. 02/17/17 02/24/17  Volanda Napoleon, PA-C    Family History Family History  Problem Relation Age of Onset  . Hypertension Mother   . Diabetes Brother   . Diabetes Sister   . Cancer Maternal Aunt     Social History Social History  Substance Use Topics  . Smoking status: Never Smoker  . Smokeless tobacco: Never Used  . Alcohol use No     Allergies   Patient has no known allergies.   Review of Systems Review of Systems  Constitutional: Negative for chills and fever.  Eyes:  Positive for visual disturbance (resolved).  Respiratory: Negative for cough and shortness of breath.   Cardiovascular: Negative for chest pain and leg swelling.  Gastrointestinal: Negative for abdominal pain, diarrhea, nausea and vomiting.  Genitourinary: Negative for dysuria and hematuria.  Musculoskeletal: Negative for neck pain.  Neurological: Negative for weakness, numbness and headaches.     Physical Exam Updated Vital Signs BP 120/73 (BP Location: Right Arm)   Pulse 62   Temp 97.7 F (36.5 C)   Resp 16   SpO2 97%   Physical Exam  Constitutional: He is oriented to person, place, and time. He appears well-developed and well-nourished.  Sitting comfortably on examination table  HENT:  Head: Normocephalic and atraumatic.  Mouth/Throat: Oropharynx is clear and moist and mucous membranes are normal.  Eyes: Pupils are equal, round, and reactive to light. Conjunctivae, EOM and lids are normal.  Fundoscopic  exam:      The right eye shows no hemorrhage and no papilledema.       The left eye shows no hemorrhage and no papilledema.  Neck: Full passive range of motion without pain.  Cardiovascular: Normal rate, regular rhythm, normal heart sounds and normal pulses.  Exam reveals no gallop and no friction rub.   No murmur heard. Pulmonary/Chest: Effort normal. Tachypnea noted.  Exam limited to body habitus but has good air movement. No decreased breath sounds. Questionable rales. No evidence of respiratory distress. Able to speak in full sentences without difficulty. Currently 4 L nasal cannula, which is his baseline. Had slight tachypnea on initial evaluation.  Abdominal: Soft. Normal appearance. There is no tenderness. There is no rigidity and no guarding.  Musculoskeletal: Normal range of motion.  Bilateral lower extremities are symmetric in appearance. No pitting edema.  Neurological: He is alert and oriented to person, place, and time.  Cranial nerves III-XII intact Follows  commands, Moves all extremities  5/5 strength to BUE and BLE  Sensation intact throughout all major nerve distributions Normal finger to nose. No dysdiadochokinesia. No pronator drift. No gait abnormalities  No slurred speech. No facial droop.   Skin: Skin is warm and dry. Capillary refill takes less than 2 seconds.  Psychiatric: He has a normal mood and affect. His speech is normal.  Nursing note and vitals reviewed.    ED Treatments / Results  Labs (all labs ordered are listed, but only abnormal results are displayed) Labs Reviewed  CBC - Abnormal; Notable for the following:       Result Value   Hemoglobin 11.5 (*)    MCH 25.7 (*)    MCHC 29.1 (*)    RDW 19.3 (*)    Platelets 143 (*)    All other components within normal limits  COMPREHENSIVE METABOLIC PANEL - Abnormal; Notable for the following:    Chloride 99 (*)    CO2 36 (*)    Glucose, Bld 118 (*)    BUN 32 (*)    Creatinine, Ser 2.99 (*)    Calcium 8.3 (*)    Albumin 2.9 (*)    ALT 15 (*)    Alkaline Phosphatase 33 (*)    GFR calc non Af Amer 22 (*)    GFR calc Af Amer 26 (*)    All other components within normal limits  BRAIN NATRIURETIC PEPTIDE - Abnormal; Notable for the following:    B Natriuretic Peptide 341.7 (*)    All other components within normal limits  CBG MONITORING, ED - Abnormal; Notable for the following:    Glucose-Capillary 110 (*)    All other components within normal limits  I-STAT CHEM 8, ED - Abnormal; Notable for the following:    Chloride 97 (*)    BUN 36 (*)    Creatinine, Ser 2.90 (*)    Glucose, Bld 115 (*)    Calcium, Ion 1.09 (*)    TCO2 36 (*)    All other components within normal limits  PROTIME-INR  APTT  DIFFERENTIAL  I-STAT TROPONIN, ED    EKG  EKG Interpretation  Date/Time:  Monday February 17 2017 14:49:23 EDT Ventricular Rate:  57 PR Interval:  154 QRS Duration: 102 QT Interval:  439 QTC Calculation: 428 R Axis:   73 Text Interpretation:  Sinus rhythm  Low voltage, precordial leads No significant change since last tracing Confirmed by Blanchie Dessert [phone]) on 02/17/2017 3:41:01 PM  Radiology Dg Chest 2 View  Result Date: 02/17/2017 CLINICAL DATA:  Acute on chronic shortness of breath on home oxygen. History of COPD, previous MI, CHF. EXAM: CHEST  2 VIEW COMPARISON:  PA and lateral chest x-ray of February 06, 2017 FINDINGS: Today's study is obtained in a lordotic fashion. The lungs are reasonably well inflated. The cardiac silhouette is enlarged. The central pulmonary vascularity is engorged and there is mild cephalization. There is no definite pleural effusion. On the lateral view increased density projects over the lower thoracic spine worrisome for pneumonia. IMPRESSION: Limited study due to patient positioning. CHF with mild pulmonary vascular prominence. Probable left lower lobe pneumonia. Follow-up radiographs following anticipated antibiotic therapy are recommended to assure clearing. Electronically Signed   By: David  Martinique M.D.   On: 02/17/2017 15:58   Ct Head Wo Contrast  Result Date: 02/17/2017 CLINICAL DATA:  Focal neurologic deficit.  Stroke suspected. EXAM: CT HEAD WITHOUT CONTRAST TECHNIQUE: Contiguous axial images were obtained from the base of the skull through the vertex without intravenous contrast. COMPARISON:  11/25/2014 FINDINGS: Brain: No evidence of acute infarction, hemorrhage, hydrocephalus, extra-axial collection or mass lesion/mass effect. Vascular: No hyperdense vessel or unexpected calcification. Skull: Normal. Negative for fracture or focal lesion. Sinuses/Orbits: No acute finding. Other: None. IMPRESSION: 1. No acute intracranial abnormalities.  Normal brain. Electronically Signed   By: Kerby Moors M.D.   On: 02/17/2017 13:32    Procedures Procedures (including critical care time)  Medications Ordered in ED Medications - No data to display   Initial Impression / Assessment and Plan / ED Course  I  have reviewed the triage vital signs and the nursing notes.  Pertinent labs & imaging results that were available during my care of the patient were reviewed by me and considered in my medical decision making (see chart for details).     56 year old male with hypertension, CHF who is on 4L O2 baseline who presents with concerns of hypertension and intermittent blurry vision. Currently no vision changes. No headache, numbness/weakness of extremities, CP, SOB. Patient is afebrile, non-toxic appearing, sitting comfortably on examination table. Vital signs reviewed. Initial RR was 20. O2 sat 100% on 4L O2 which is patient's baseline. Patient has normal BP. Currently denies any vision changes at this time. No evidence of papilledema on physical exam. No neuro deficits on physical exam. Do not suspect Hypertensive emergency at this time given vitals. Do not suspect CVA given reassuring history/physical exam.  During my exam, patient was slightly tachypneic at 33. No SOB. No evidence of respiratory distress. His O2 sat remained >95% on 4L without any difficulty during this time. Difficulty assessing a lung exam secondary to patient's body habitus. No signs of fluid overload. Patient recently admitted for CHF exacerbation and has been compliant with his Lasix, has not taken his second dose today. Initial labs ordered at triage. Will add additional CXR and BNP.   Labs reviewed. CMP shows CO2 at 36. Records reviewed show that this patient's chronic baseline. Suspect her has chronic hypercapnea. BUN/Cr elevated but shows that this is consistent with patient's baseline. CBC unremarkable. APTT/PT/INR unremarkable. EKG shows sinus 57. CXR and BNP pending.   BNP is slightly elevated at 341.7. CXR shows is hard to read given patient's body habitus and positioning but there is questionable LLL pneumonia with mild pulmonary congestion. I reviewed the XR myself and there is questionable consilidation at the left lung bases.  No evidence of effusion. Discussed results with patient. He denies any  recent fevers, SOB, productive cough. He is still denying any CP or SOB. His RR has improved to 22. History/physical exam are not concerning acute respiratory distress or acute flash pulimonary edema. Patient has ambulated in the department without any difficulty or SOB. Given questionable appearance on CXR will plan to treat as pneumonia as this could be an early presentation. Since patient was recently admitted this could be considered HCAP but givent hat patient is asymptomatic I do not feel he requires IV antibiotics at this time. Patient's RR has improved and he has not been hypoxic during ED course. Patient has an appointment with his cardiologist tomorrow and with his PCP at the end of the week. Encouraged him to keep those appointments. Patient is stable for discharge at this time. Strict return precautions discussed. Patient expresses understanding and agreement to plan.   As patient was being discharged, he said his O2 tanks were nearing empty. He is attempting ot call someone at home to bring him a new tank. Will plan to consult social work for assistance.   Social work will talk to case management to see if we can provide patient with a new O2 tank. Patient is discharged at this time, but will wait in the ED until he can get a new tank. I informed oncoming Adam Perna, PA-C regarding patient. She will discuss with case management.    Final Clinical Impressions(s) / ED Diagnoses   Final diagnoses:  Hypertension, unspecified type  Community acquired pneumonia of left lower lobe of lung (HCC)    New Prescriptions New Prescriptions   DOXYCYCLINE (VIBRAMYCIN) 100 MG CAPSULE    Take 1 capsule (100 mg total) by mouth 2 (two) times daily.     Volanda Napoleon, PA-C 02/19/17 [place], MD 02/19/17 1429

## 2017-02-17 NOTE — Discharge Instructions (Signed)
Take your Lasix when you get home.   Take antibiotics as directed. Please take all of your antibiotics until finished.  Follow-up with your cardiologist tomorrow as previously arranged.   Follow-up with your primary care doctor in the next 24-48 hours for further evaluation.  Return the emergency Department for any worsening pain, difficulty breathing, chest pain, fevers, swelling in your legs, vomiting or any other worsening or concerning symptoms.

## 2017-02-17 NOTE — ED Notes (Signed)
Pt was able to walk from room to restroom, tolerate well. Pt on continuous [place] 4L.

## 2017-02-17 NOTE — ED Notes (Signed)
Pt said he has more O2 tanks at home but the one he came with is empty. Pt has no family member to take him home so will call a cab. Social work contacted re: more oxygen and ride.

## 2017-02-18 ENCOUNTER — Telehealth: Payer: Self-pay

## 2017-02-18 LAB — CBG MONITORING, ED: GLUCOSE-CAPILLARY: 85 mg/dL (ref 65–99)

## 2017-02-18 NOTE — Telephone Encounter (Signed)
Call placed to the patient to check on him as he was in the ED yesterday. He stated that he is feeling : okay..better than yesterday."  He stated that he has to pick up the doxycycline today at [facility]. He continues to use his O2 @ 4L/mon continuously.  He noted that the tele-monitoring wakes him up at 0800. He reported that his BP after taking his medications this morning was 120/71.   He also confirmed that he has been keeping a log of his blood sugars and this morning his blood sugar was " about 88."  Instructed him to bring the blood sugar log with him to his appointment next week.  He stated that he has an appointment for a SCAT assessment on 02/26/17 or 02/27/17. He said that he has been trying to schedule medicaid transportation to his medical appointments but needs to give 2-3 day notice. Instructed him to contact medicaid to arrange transportation to his appointment at the [facility] on 02/24/17 @ 0945.  He confirmed the appointment. The clinic will provide cab transportation for him if he is not able to arrange medicaid transportation  He stated that he is not sure of the company that provided him with the CPAP machine ( Remstar plus- M series) [employer]. He thinks he needs a new mask and is not sure of the need for a new machine.  He had the sleep study done at [facility] sleep center in 2008.  Informed him he may need a new sleep study in order to qualify for a new mask/CPAP machine   Call placed to [facility] - spoke to [place] with CPAP department to inquire about ordering a new mask for the patient. She stated that for insurance to cover the new mask and/or machine, he will need a new sleep study.

## 2017-02-19 ENCOUNTER — Ambulatory Visit (HOSPITAL_COMMUNITY)
Admission: RE | Admit: 2017-02-19 | Discharge: 2017-02-19 | Disposition: A | Discharge status: Home / Self Care | Payer: Medicaid Other | Source: Ambulatory Visit | Attending: Family Medicine | Admitting: Family Medicine

## 2017-02-19 ENCOUNTER — Telehealth: Payer: Self-pay

## 2017-02-19 DIAGNOSIS — J9611 Chronic respiratory failure with hypoxia: Secondary | ICD-10-CM | POA: Insufficient documentation

## 2017-02-19 DIAGNOSIS — J449 Chronic obstructive pulmonary disease, unspecified: Secondary | ICD-10-CM | POA: Diagnosis not present

## 2017-02-19 LAB — PULMONARY FUNCTION TEST
DL/VA % pred: 119 %
DL/VA: 5.53 ml/min/mmHg/L
DLCO UNC % PRED: 35 %
DLCO cor % pred: 39 %
DLCO cor: 12.87 ml/min/mmHg
DLCO unc: 11.58 ml/min/mmHg
FEF 25-75 PRE: 1.1 L/s
FEF 25-75 Post: 1.43 L/sec
FEF2575-%CHANGE-POST: 30 %
FEF2575-%PRED-POST: 45 %
FEF2575-%Pred-Pre: 35 %
FEV1-%CHANGE-POST: 2 %
FEV1-%PRED-POST: 31 %
FEV1-%PRED-PRE: 30 %
FEV1-POST: 1.03 L
FEV1-PRE: 1 L
FEV1FVC-%CHANGE-POST: 0 %
FEV1FVC-%PRED-PRE: 107 %
FEV6-%Change-Post: 3 %
FEV6-%PRED-POST: 30 %
FEV6-%Pred-Pre: 29 %
FEV6-PRE: 1.17 L
FEV6-Post: 1.21 L
FEV6FVC-%PRED-PRE: 103 %
FEV6FVC-%Pred-Post: 103 %
FVC-%CHANGE-POST: 3 %
FVC-%PRED-POST: 29 %
FVC-%PRED-PRE: 28 %
FVC-POST: 1.21 L
FVC-PRE: 1.17 L
POST FEV1/FVC RATIO: 84 %
PRE FEV6/FVC RATIO: 100 %
Post FEV6/FVC ratio: 100 %
Pre FEV1/FVC ratio: 85 %
RV % PRED: 83 %
RV: 1.78 L
TLC % pred: 43 %
TLC: 3.02 L

## 2017-02-19 MED ORDER — ALBUTEROL SULFATE (2.5 MG/3ML) 0.083% IN NEBU
2.5000 mg | INHALATION_SOLUTION | Freq: Once | RESPIRATORY_TRACT | Status: AC
  Administered 2017-02-19: 2.5 mg via RESPIRATORY_TRACT

## 2017-02-19 NOTE — Telephone Encounter (Signed)
Call placed to the patient to inform him that his appointment for 02/24/17 will need to be rescheduled. An appointment was scheduled for 02/21/17 @ 1330. Cab transportation to be arranged for the patient.

## 2017-02-21 ENCOUNTER — Ambulatory Visit: Payer: Medicaid Other | Attending: Family Medicine | Admitting: Family Medicine

## 2017-02-21 ENCOUNTER — Encounter: Payer: Self-pay | Admitting: Family Medicine

## 2017-02-21 VITALS — BP 117/72 | HR 59 | Temp 97.6°F | Ht 71.0 in | Wt 351.8 lb

## 2017-02-21 DIAGNOSIS — J181 Lobar pneumonia, unspecified organism: Secondary | ICD-10-CM

## 2017-02-21 DIAGNOSIS — J9611 Chronic respiratory failure with hypoxia: Secondary | ICD-10-CM | POA: Diagnosis not present

## 2017-02-21 DIAGNOSIS — J438 Other emphysema: Secondary | ICD-10-CM | POA: Diagnosis not present

## 2017-02-21 DIAGNOSIS — Z79899 Other long term (current) drug therapy: Secondary | ICD-10-CM | POA: Insufficient documentation

## 2017-02-21 DIAGNOSIS — N184 Chronic kidney disease, stage 4 (severe): Secondary | ICD-10-CM | POA: Diagnosis not present

## 2017-02-21 DIAGNOSIS — I10 Essential (primary) hypertension: Secondary | ICD-10-CM | POA: Diagnosis not present

## 2017-02-21 DIAGNOSIS — E7800 Pure hypercholesterolemia, unspecified: Secondary | ICD-10-CM | POA: Diagnosis not present

## 2017-02-21 DIAGNOSIS — E1169 Type 2 diabetes mellitus with other specified complication: Secondary | ICD-10-CM | POA: Diagnosis not present

## 2017-02-21 DIAGNOSIS — Z9119 Patient's noncompliance with other medical treatment and regimen: Secondary | ICD-10-CM | POA: Diagnosis not present

## 2017-02-21 DIAGNOSIS — J440 Chronic obstructive pulmonary disease with acute lower respiratory infection: Secondary | ICD-10-CM | POA: Insufficient documentation

## 2017-02-21 DIAGNOSIS — I252 Old myocardial infarction: Secondary | ICD-10-CM | POA: Diagnosis not present

## 2017-02-21 DIAGNOSIS — Z7951 Long term (current) use of inhaled steroids: Secondary | ICD-10-CM | POA: Diagnosis not present

## 2017-02-21 DIAGNOSIS — J189 Pneumonia, unspecified organism: Secondary | ICD-10-CM

## 2017-02-21 DIAGNOSIS — E1122 Type 2 diabetes mellitus with diabetic chronic kidney disease: Secondary | ICD-10-CM | POA: Insufficient documentation

## 2017-02-21 DIAGNOSIS — Z9981 Dependence on supplemental oxygen: Secondary | ICD-10-CM | POA: Insufficient documentation

## 2017-02-21 DIAGNOSIS — J449 Chronic obstructive pulmonary disease, unspecified: Secondary | ICD-10-CM | POA: Insufficient documentation

## 2017-02-21 DIAGNOSIS — I130 Hypertensive heart and chronic kidney disease with heart failure and stage 1 through stage 4 chronic kidney disease, or unspecified chronic kidney disease: Secondary | ICD-10-CM | POA: Insufficient documentation

## 2017-02-21 DIAGNOSIS — Z6841 Body Mass Index (BMI) 40.0 and over, adult: Secondary | ICD-10-CM | POA: Diagnosis not present

## 2017-02-21 DIAGNOSIS — E669 Obesity, unspecified: Secondary | ICD-10-CM | POA: Diagnosis not present

## 2017-02-21 DIAGNOSIS — Z9889 Other specified postprocedural states: Secondary | ICD-10-CM | POA: Insufficient documentation

## 2017-02-21 DIAGNOSIS — Z7982 Long term (current) use of aspirin: Secondary | ICD-10-CM | POA: Diagnosis not present

## 2017-02-21 DIAGNOSIS — G4733 Obstructive sleep apnea (adult) (pediatric): Secondary | ICD-10-CM | POA: Insufficient documentation

## 2017-02-21 DIAGNOSIS — I5042 Chronic combined systolic (congestive) and diastolic (congestive) heart failure: Secondary | ICD-10-CM | POA: Insufficient documentation

## 2017-02-21 LAB — GLUCOSE, POCT (MANUAL RESULT ENTRY): POC Glucose: 90 mg/dl (ref 70–99)

## 2017-02-21 MED ORDER — ALBUTEROL SULFATE HFA 108 (90 BASE) MCG/ACT IN AERS: 2.0000 | INHALATION_SPRAY | Freq: Four times a day (QID) | RESPIRATORY_TRACT | 3 refills | Status: DC | PRN

## 2017-02-21 MED ORDER — BUDESONIDE-FORMOTEROL FUMARATE 160-4.5 MCG/ACT IN AERO: 2.0000 | INHALATION_SPRAY | Freq: Two times a day (BID) | RESPIRATORY_TRACT | 3 refills | Status: DC

## 2017-02-21 NOTE — Patient Instructions (Signed)
Chronic Obstructive Pulmonary Disease Chronic obstructive pulmonary disease (COPD) is a common lung condition in which airflow from the lungs is limited. COPD is a general term that can be used to describe many different lung problems that limit airflow, including both chronic bronchitis and emphysema. If you have COPD, your lung function will probably never return to normal, but there are measures you can take to improve lung function and make yourself feel better. What are the causes?  Smoking (common).  Exposure to secondhand smoke.  Genetic problems.  Chronic inflammatory lung diseases or recurrent infections. What are the signs or symptoms?  Shortness of breath, especially with physical activity.  Deep, persistent (chronic) cough with a large amount of thick mucus.  Wheezing.  Rapid breaths (tachypnea).  Gray or bluish discoloration (cyanosis) of the skin, especially in your fingers, toes, or lips.  Fatigue.  Weight loss.  Frequent infections or episodes when breathing symptoms become much worse (exacerbations).  Chest tightness. How is this diagnosed? Your health care provider will take a medical history and perform a physical examination to diagnose COPD. Additional tests for COPD may include:  Lung (pulmonary) function tests.  Chest X-ray.  CT scan.  Blood tests. How is this treated? Treatment for COPD may include:  Inhaler and nebulizer medicines. These help manage the symptoms of COPD and make your breathing more comfortable.  Supplemental oxygen. Supplemental oxygen is only helpful if you have a low oxygen level in your blood.  Exercise and physical activity. These are beneficial for nearly all people with COPD.  Lung surgery or transplant.  Nutrition therapy to gain weight, if you are underweight.  Pulmonary rehabilitation. This may involve working with a team of health care providers and specialists, such as respiratory, occupational, and physical  therapists. Follow these instructions at home:  Take all medicines (inhaled or pills) as directed by your health care provider.  Avoid over-the-counter medicines or cough syrups that dry up your airway (such as antihistamines) and slow down the elimination of secretions unless instructed otherwise by your health care provider.  If you are a smoker, the most important thing that you can do is stop smoking. Continuing to smoke will cause further lung damage and breathing trouble. Ask your health care provider for help with quitting smoking. He or she can direct you to community resources or hospitals that provide support.  Avoid exposure to irritants such as smoke, chemicals, and fumes that aggravate your breathing.  Use oxygen therapy and pulmonary rehabilitation if directed by your health care provider. If you require home oxygen therapy, ask your health care provider whether you should purchase a pulse oximeter to measure your oxygen level at home.  Avoid contact with individuals who have a contagious illness.  Avoid extreme temperature and humidity changes.  Eat healthy foods. Eating smaller, more frequent meals and resting before meals may help you maintain your strength.  Stay active, but balance activity with periods of rest. Exercise and physical activity will help you maintain your ability to do things you want to do.  Preventing infection and hospitalization is very important when you have COPD. Make sure to receive all the vaccines your health care provider recommends, especially the pneumococcal and influenza vaccines. Ask your health care provider whether you need a pneumonia vaccine.  Learn and use relaxation techniques to manage stress.  Learn and use controlled breathing techniques as directed by your health care provider. Controlled breathing techniques include: 1. Pursed lip breathing. Start by breathing in (inhaling)   through your nose for 1 second. Then, purse your lips as  if you were going to whistle and breathe out (exhale) through the pursed lips for 2 seconds. 2. Diaphragmatic breathing. Start by putting one hand on your abdomen just above your waist. Inhale slowly through your nose. The hand on your abdomen should move out. Then purse your lips and exhale slowly. You should be able to feel the hand on your abdomen moving in as you exhale.  Learn and use controlled coughing to clear mucus from your lungs. Controlled coughing is a series of short, progressive coughs. The steps of controlled coughing are: 1. Lean your head slightly forward. 2. Breathe in deeply using diaphragmatic breathing. 3. Try to hold your breath for 3 seconds. 4. Keep your mouth slightly open while coughing twice. 5. Spit any mucus out into a tissue. 6. Rest and repeat the steps once or twice as needed. Contact a health care provider if:  You are coughing up more mucus than usual.  There is a change in the color or thickness of your mucus.  Your breathing is more labored than usual.  Your breathing is faster than usual. Get help right away if:  You have shortness of breath while you are resting.  You have shortness of breath that prevents you from:  Being able to talk.  Performing your usual physical activities.  You have chest pain lasting longer than 5 minutes.  Your skin color is more cyanotic than usual.  You measure low oxygen saturations for longer than 5 minutes with a pulse oximeter. This information is not intended to replace advice given to you by your health care provider. Make sure you discuss any questions you have with your health care provider. Document Released: 02/20/2005 Document Revised: 10/19/2015 Document Reviewed: 01/07/2013 Elsevier Interactive Patient Education  2017 [employer]  

## 2017-02-21 NOTE — Progress Notes (Signed)
Transitional care clinic  Subjective:  Patient ID: Adam Dennis, male    DOB: August 01, 1960  Age: 56 y.o. MRN: [id number]  CC: Congestive Heart Failure   HPI Adam Dennis s a 56 year old male with a history of hypertension, stage III chronic kidney disease, obstructive sleep apnea, chronic respiratory failure with hypoxia (on 4 L of oxygen at rest), type 2 diabetes mellitus (A1c 6.2), combined systolic and diastolic CHF (EF 29-79% from 08/2016) followed by cardiology (Dr. Doylene Canard) who presents for a follow up at the transitional care clinic for a follow-up visit  From 02/06/17 through 02/11/17 he was hospitalized for acute exacerbation of chronic systolic and diastolic heart failure during which time he responded to IV Lasix, lisinopril and potassium were held due to acute on chronic kidney disease.  Four days ago he had presented to the ED with elevated home blood pressure reading of 186/126, was ruled out for stroke. Chest x-ray revealed probable left lower lobe pneumonia which was treated as healthcare associated pneumonia given recent hospitalization and he was placed on doxycycline.  Today he remains on his 4 L of oxygen and has not had any increase in demand; he denies chest pains. He has been compliant with his telemetry monitoring and complains that his blood pressures have been high on some occasions with a 154/75. He has also had readings in the 892 systolic improved after sitting for about 10 minutes. He has obstructive sleep apnea but does not comply with his CPAP machine due to having the wrong mask which he has been unable to rectify from [facility].  His most recent PFTs revealed severe obstructive airway disease, severe restrictive disease, severely reduced DLCO.  Past Medical History:  Diagnosis Date  . Arthritis    "back" (02/06/2017)  . CHF (congestive heart failure) ([place])   . Chronic combined systolic and diastolic heart failure ([place])   . CKD (chronic kidney  disease) stage 4, GFR 15-29 ml/min (HCC)   . High cholesterol   . Hypertension   . Hypertrophy of tonsils alone   . Myocardial infarction [facility])    "small one; ~ 2008" (02/06/2017)  . Obesity, unspecified   . On home oxygen therapy    "2-4L; 24/7" (02/06/2017)  . OSA on CPAP   . Type II diabetes mellitus ([place])     Past Surgical History:  Procedure Laterality Date  . HERNIA REPAIR    . UMBILICAL HERNIA REPAIR      No Known Allergies   Outpatient Medications Prior to Visit  Medication Sig Dispense Refill  . allopurinol (ZYLOPRIM) 100 MG tablet Take 1 tablet (100 mg total) by mouth daily. 30 tablet 6  . aspirin 325 MG tablet Take 325 mg by mouth daily.    Marland Kitchen atorvastatin (LIPITOR) 40 MG tablet Take 1 tablet (40 mg total) by mouth daily. 30 tablet 3  . Blood Glucose Monitoring Suppl (ACCU-CHEK AVIVA) device Use as instructed daily. 1 each 0  . calcium carbonate (TUMS - DOSED IN MG ELEMENTAL CALCIUM) 500 MG chewable tablet Chew 1 tablet (200 mg of elemental calcium total) by mouth 2 (two) times daily.    . carvedilol (COREG) 12.5 MG tablet Take 12.5 mg by mouth 2 (two) times daily.  0  . carvedilol (COREG) 6.25 MG tablet Take 6.25 mg by mouth 2 (two) times daily.  0  . doxycycline (VIBRAMYCIN) 100 MG capsule Take 1 capsule (100 mg total) by mouth 2 (two) times daily. 14 capsule 0  . furosemide (  LASIX) 40 MG tablet Take 1 tablet (40 mg total) by mouth 2 (two) times daily.    Marland Kitchen glucose blood (ACCU-CHEK AVIVA) test strip Use as instructed daily 100 each 12  . hydrALAZINE (APRESOLINE) 50 MG tablet Take 0.5 tablets (25 mg total) by mouth 3 (three) times daily. 90 tablet 2  . isosorbide mononitrate (IMDUR) 30 MG 24 hr tablet Take 1 tablet (30 mg total) by mouth daily. 30 tablet 5  . Lancet Devices (ACCU-CHEK SOFTCLIX) lancets Use as instructed daily. 1 each 5  . lisinopril (PRINIVIL,ZESTRIL) 20 MG tablet Take 20 mg by mouth daily.  0  . nitroGLYCERIN (NITROSTAT) 0.4 MG SL tablet Place 1 tablet  (0.4 mg total) under the tongue every 5 (five) minutes x 3 doses as needed for chest pain. 25 tablet 1  . NON FORMULARY 1 each by Other route at bedtime. CPAP MACHINE    . OXYGEN Inhale 4 L into the lungs daily.    . TRAVATAN Z 0.004 % SOLN ophthalmic solution Place 1 drop into both eyes at bedtime.  12   No facility-administered medications prior to visit.     ROS Review of Systems  Constitutional: Negative for activity change and appetite change.  HENT: Negative for sinus pressure and sore throat.   Eyes: Negative for visual disturbance.  Respiratory: Positive for shortness of breath (chronic shortness of breath at baseline). Negative for cough and chest tightness.   Cardiovascular: Negative for chest pain and leg swelling.  Gastrointestinal: Negative for abdominal distention, abdominal pain, constipation and diarrhea.  Endocrine: Negative.   Genitourinary: Negative for dysuria.  Musculoskeletal: Negative for joint swelling and myalgias.  Skin: Negative for rash.  Allergic/Immunologic: Negative.   Neurological: Negative for weakness, light-headedness and numbness.  Psychiatric/Behavioral: Negative for dysphoric mood and suicidal ideas.    Objective:  BP 117/72   Pulse (!) 59   Temp 97.6 F (36.4 C) (Oral)   Ht 5\' 11"  (1.803 m)   Wt (!) 351 lb 12.8 oz (159.6 kg)   SpO2 93%   BMI 49.07 kg/m   BP/Weight 02/21/2017 02/17/2017 2/58/5277  Systolic BP 824 235 361  Diastolic BP 72 73 81  Wt. (Lbs) 351.8 - 348.6  BMI 49.07 - 48.62      Physical Exam  Constitutional: He is oriented to person, place, and time. He appears well-developed and well-nourished.  Morbidly obese  HENT:  On 4 L of oxygen via nasal cannula  Cardiovascular: Normal rate, normal heart sounds and intact distal pulses.   No murmur heard. Pulmonary/Chest: Effort normal and breath sounds normal. He has no wheezes. He has no rales. He exhibits no tenderness.  Abdominal: Soft. Bowel sounds are normal. He  exhibits no distension and no mass. There is no tenderness.  Musculoskeletal: Normal range of motion. He exhibits no edema.  Neurological: He is alert and oriented to person, place, and time.  Skin: Skin is warm and dry.  Psychiatric: He has a normal mood and affect.    Lab Results  Component Value Date   HGBA1C 6.2 12/16/2016    Assessment & Plan:   1. Diabetes mellitus type 2 in obese ([place]) Controlled with A1c of 6.2 - POCT glucose (manual entry)  2. Chronic combined systolic and diastolic CHF (congestive heart failure) (HCC) EF of 45-50%, mildly reduced systolic function from 2-D echo/2018 Euvolemic Continue medications Continue medications, daily weights, low sodium, heart healthy diet Keep appointment with cardiology  3. Essential hypertension, benign Blood pressure is controlled in  the clinic Elevated home blood pressures could be secondary to uncontrolled sleep apnea Will work on obtaining a mask for him to ensure compliance Low-sodium diet  4. Chronic respiratory failure with hypoxia (HCC) Currently on 4 L of oxygen  5. Other emphysema ([place]) New diagnoses PFT reveals severe obstructive airway disease, severe restrictive disease If symptoms persist will refer to pulmonary Commence Symbicort and Proventil - budesonide-formoterol (SYMBICORT) 160-4.5 MCG/ACT inhaler; Inhale 2 puffs into the lungs 2 (two) times daily.  Dispense: 1 Inhaler; Refill: 3 - albuterol (PROVENTIL HFA;VENTOLIN HFA) 108 (90 Base) MCG/ACT inhaler; Inhale 2 puffs into the lungs every 6 (six) hours as needed for wheezing or shortness of breath.  Dispense: 1 Inhaler; Refill: 3  6. Morbid obesity ([place]) Unable to exercise much due to reduced exercise tolerance Will need to work on  limiting caloric intake  7. Obstructive sleep apnea Not compliant with CPAP machine We'll get in touch with [facility] regarding a new mask  8. Pneumonia of left lower lobe due to infectious organism  [facility]) Completed course of doxycycline Improved We'll order chest x-ray at next visit to evaluate for complete resolution   Meds ordered this encounter  Medications  . budesonide-formoterol (SYMBICORT) 160-4.5 MCG/ACT inhaler    Sig: Inhale 2 puffs into the lungs 2 (two) times daily.    Dispense:  1 Inhaler    Refill:  3  . albuterol (PROVENTIL HFA;VENTOLIN HFA) 108 (90 Base) MCG/ACT inhaler    Sig: Inhale 2 puffs into the lungs every 6 (six) hours as needed for wheezing or shortness of breath.    Dispense:  1 Inhaler    Refill:  3    Follow-up: Return in about 3 weeks (around 03/14/2017) for follow up on COPD.   [name] MD

## 2017-02-24 ENCOUNTER — Encounter: Payer: Self-pay | Admitting: Pharmacist

## 2017-02-24 ENCOUNTER — Inpatient Hospital Stay: Payer: Medicaid Other | Admitting: Family Medicine

## 2017-02-24 NOTE — Progress Notes (Signed)
Prior authorization completed for Symbicort through [facility] Medicaid. Approved #[id number]

## 2017-02-25 ENCOUNTER — Telehealth: Payer: Self-pay | Admitting: Family Medicine

## 2017-02-25 NOTE — Telephone Encounter (Signed)
Call placed to patient [phone] in regards to his SCAT application. No answer. Left patient a message to return my call at [phone]. Wanted to ask patient if someone from [place] had contacted him in order to schedule his assessment.

## 2017-02-28 ENCOUNTER — Telehealth: Payer: Self-pay | Admitting: Family Medicine

## 2017-02-28 NOTE — Telephone Encounter (Signed)
Call placed to patient [phone] to check on the status of his SCAT application I spoke with the patient and he informed me that he got approved and received his SCAT ID yesterday. Reminded patient that he has an appointment on 10/17. He agreed on moving appointment to a sooner date if we get an opening. Reminded patient to bring bp log with him to his appointment.

## 2017-03-07 ENCOUNTER — Telehealth: Payer: Self-pay | Admitting: Family Medicine

## 2017-03-07 NOTE — Telephone Encounter (Signed)
Advised to check his weight for any change and notify the clinic. Continue with Lasix and please ensure he has a cardiology follow-up.

## 2017-03-07 NOTE — Telephone Encounter (Signed)
Call placed to patient # [phone] to check on his status and inform him of his upcoming appointment. Spoke with patient and he informed me that he has been taking his medication and is feeling good but that he has been experiencing some swelling on both feet. He is not in pain and can walk. Informed patient that I will be notifying PCP.  During conversation I reminded patient that he has an upcoming appointment. Patient stated that he will be needing transportation to appointment.

## 2017-03-10 NOTE — Telephone Encounter (Signed)
Call placed to the patient to check on him and report back from Dr Jarold Song. Informed him to continue to take his lasix as ordered - 40 mg twice a day and he said that is what he is taking. He has telemonitoring with [facility] and stated that his weight was 345 lbs yesterday.  He did not check it today but noted that his weight has been the same for a few days and has not varied by a couple of pounds since he has been home. He said that he does not have an appointment with cardiology because they have cancelled the appointments. Instructed him to call to schedule the appointment as soon as possible. He stated that he has the phone# an will call to make the appointment.   Reminded him of his appointment at [facility] this week  - 03/12/17.

## 2017-03-12 ENCOUNTER — Telehealth: Payer: Self-pay | Admitting: Family Medicine

## 2017-03-12 ENCOUNTER — Ambulatory Visit: Payer: Medicaid Other | Attending: Family Medicine | Admitting: Family Medicine

## 2017-03-12 ENCOUNTER — Encounter: Payer: Self-pay | Admitting: Family Medicine

## 2017-03-12 VITALS — BP 142/77 | HR 69 | Temp 97.5°F | Ht 71.0 in | Wt 352.0 lb

## 2017-03-12 DIAGNOSIS — I10 Essential (primary) hypertension: Secondary | ICD-10-CM

## 2017-03-12 DIAGNOSIS — E1122 Type 2 diabetes mellitus with diabetic chronic kidney disease: Secondary | ICD-10-CM | POA: Insufficient documentation

## 2017-03-12 DIAGNOSIS — J438 Other emphysema: Secondary | ICD-10-CM | POA: Diagnosis not present

## 2017-03-12 DIAGNOSIS — I252 Old myocardial infarction: Secondary | ICD-10-CM | POA: Insufficient documentation

## 2017-03-12 DIAGNOSIS — I130 Hypertensive heart and chronic kidney disease with heart failure and stage 1 through stage 4 chronic kidney disease, or unspecified chronic kidney disease: Secondary | ICD-10-CM | POA: Insufficient documentation

## 2017-03-12 DIAGNOSIS — J181 Lobar pneumonia, unspecified organism: Secondary | ICD-10-CM | POA: Diagnosis not present

## 2017-03-12 DIAGNOSIS — E669 Obesity, unspecified: Secondary | ICD-10-CM | POA: Insufficient documentation

## 2017-03-12 DIAGNOSIS — Z79899 Other long term (current) drug therapy: Secondary | ICD-10-CM | POA: Insufficient documentation

## 2017-03-12 DIAGNOSIS — G4733 Obstructive sleep apnea (adult) (pediatric): Secondary | ICD-10-CM | POA: Diagnosis not present

## 2017-03-12 DIAGNOSIS — E1169 Type 2 diabetes mellitus with other specified complication: Secondary | ICD-10-CM

## 2017-03-12 DIAGNOSIS — J9611 Chronic respiratory failure with hypoxia: Secondary | ICD-10-CM

## 2017-03-12 DIAGNOSIS — Z7982 Long term (current) use of aspirin: Secondary | ICD-10-CM | POA: Insufficient documentation

## 2017-03-12 DIAGNOSIS — N184 Chronic kidney disease, stage 4 (severe): Secondary | ICD-10-CM | POA: Diagnosis not present

## 2017-03-12 DIAGNOSIS — I5042 Chronic combined systolic (congestive) and diastolic (congestive) heart failure: Secondary | ICD-10-CM | POA: Diagnosis not present

## 2017-03-12 DIAGNOSIS — Z9981 Dependence on supplemental oxygen: Secondary | ICD-10-CM | POA: Diagnosis not present

## 2017-03-12 DIAGNOSIS — J189 Pneumonia, unspecified organism: Secondary | ICD-10-CM | POA: Diagnosis not present

## 2017-03-12 LAB — GLUCOSE, POCT (MANUAL RESULT ENTRY): POC Glucose: 110 mg/dl — AB (ref 70–99)

## 2017-03-12 LAB — POCT GLYCOSYLATED HEMOGLOBIN (HGB A1C): HEMOGLOBIN A1C: 6.1

## 2017-03-12 NOTE — Progress Notes (Signed)
Transitional care clinic  Subjective:  Patient ID: Adam Dennis, male    DOB: 1960/06/30  Age: 56 y.o. MRN: [id number]  CC: Diabetes   HPI Adam Dennis is a 56 year old male with a history of hypertension, stage III chronic kidney disease, obstructive sleep apnea, chronic respiratory failure with hypoxia (on 2 L of oxygen at rest), type 2 diabetes mellitus (A1c 6.2), combined systolic and diastolic CHF (EF 24-09% from 08/2016) followed by cardiology (Dr. Doylene Canard) who presents for a follow up at the transitional care clinic.   At  his last visit his pulmonary function tests was reviewed with him at which time he was commenced on Symbicort and Proventil. He reports no improvement in his shortness of breath despite using his inhalers as he got short of breath while walking from the waiting room to the exam room. Of note he informs me he reduced his oxygen requirement from 4 L to 2 L as instructed by one of the home health nurses.  Pulmonary Function Diagnosis from 02/19/17: Severe Obstructive Airways Disease Insignificant response to bronchodilator Severe Restriction -Interstitial Severe Diffusion Defect  He has chronic lower extremity edema but states his weight has been stable and he has an upcoming appointment with his cardiologist. Denies chest pains. For his diabetes remains under control.  Past Medical History:  Diagnosis Date  . Arthritis    "back" (02/06/2017)  . CHF (congestive heart failure) ([place])   . Chronic combined systolic and diastolic heart failure ([place])   . CKD (chronic kidney disease) stage 4, GFR 15-29 ml/min (HCC)   . High cholesterol   . Hypertension   . Hypertrophy of tonsils alone   . Myocardial infarction [facility])    "small one; ~ 2008" (02/06/2017)  . Obesity, unspecified   . On home oxygen therapy    "2-4L; 24/7" (02/06/2017)  . OSA on CPAP   . Type II diabetes mellitus ([place])     Past Surgical History:  Procedure Laterality Date  . HERNIA REPAIR      . UMBILICAL HERNIA REPAIR      No Known Allergies    Outpatient Medications Prior to Visit  Medication Sig Dispense Refill  . albuterol (PROVENTIL HFA;VENTOLIN HFA) 108 (90 Base) MCG/ACT inhaler Inhale 2 puffs into the lungs every 6 (six) hours as needed for wheezing or shortness of breath. 1 Inhaler 3  . allopurinol (ZYLOPRIM) 100 MG tablet Take 1 tablet (100 mg total) by mouth daily. 30 tablet 6  . aspirin 325 MG tablet Take 325 mg by mouth daily.    Marland Kitchen atorvastatin (LIPITOR) 40 MG tablet Take 1 tablet (40 mg total) by mouth daily. 30 tablet 3  . Blood Glucose Monitoring Suppl (ACCU-CHEK AVIVA) device Use as instructed daily. 1 each 0  . budesonide-formoterol (SYMBICORT) 160-4.5 MCG/ACT inhaler Inhale 2 puffs into the lungs 2 (two) times daily. 1 Inhaler 3  . calcium carbonate (TUMS - DOSED IN MG ELEMENTAL CALCIUM) 500 MG chewable tablet Chew 1 tablet (200 mg of elemental calcium total) by mouth 2 (two) times daily.    . carvedilol (COREG) 6.25 MG tablet Take 6.25 mg by mouth 2 (two) times daily.  0  . furosemide (LASIX) 40 MG tablet Take 1 tablet (40 mg total) by mouth 2 (two) times daily.    Marland Kitchen glucose blood (ACCU-CHEK AVIVA) test strip Use as instructed daily 100 each 12  . hydrALAZINE (APRESOLINE) 50 MG tablet Take 0.5 tablets (25 mg total) by mouth 3 (three) times daily.  90 tablet 2  . isosorbide mononitrate (IMDUR) 30 MG 24 hr tablet Take 1 tablet (30 mg total) by mouth daily. 30 tablet 5  . Lancet Devices (ACCU-CHEK SOFTCLIX) lancets Use as instructed daily. 1 each 5  . nitroGLYCERIN (NITROSTAT) 0.4 MG SL tablet Place 1 tablet (0.4 mg total) under the tongue every 5 (five) minutes x 3 doses as needed for chest pain. 25 tablet 1  . NON FORMULARY 1 each by Other route at bedtime. CPAP MACHINE    . OXYGEN Inhale 4 L into the lungs daily.    . TRAVATAN Z 0.004 % SOLN ophthalmic solution Place 1 drop into both eyes at bedtime.  12  . lisinopril (PRINIVIL,ZESTRIL) 20 MG tablet Take 20 mg  by mouth daily.  0  . carvedilol (COREG) 12.5 MG tablet Take 12.5 mg by mouth 2 (two) times daily.  0   No facility-administered medications prior to visit.     ROS Review of Systems  Constitutional: Negative for activity change and appetite change.  HENT: Negative for sinus pressure and sore throat.   Eyes: Negative for visual disturbance.  Respiratory: Positive for shortness of breath. Negative for cough and chest tightness.   Cardiovascular: Negative for chest pain and leg swelling.  Gastrointestinal: Negative for abdominal distention, abdominal pain, constipation and diarrhea.  Endocrine: Negative.   Genitourinary: Negative for dysuria.  Musculoskeletal: Negative for joint swelling and myalgias.  Skin: Negative for rash.  Allergic/Immunologic: Negative.   Neurological: Negative for weakness, light-headedness and numbness.  Psychiatric/Behavioral: Negative for dysphoric mood and suicidal ideas.    Objective:  BP (!) 142/77   Pulse 69   Temp (!) 97.5 F (36.4 C) (Oral)   Ht 5\' 11"  (1.803 m)   Wt (!) 352 lb (159.7 kg)   SpO2 (!) 80%   BMI 49.09 kg/m   BP/Weight 03/12/2017 02/21/2017 12/02/6281  Systolic BP 662 947 654  Diastolic BP 77 72 73  Wt. (Lbs) 352 351.8 -  BMI 49.09 49.07 -      Physical Exam  Constitutional: He is oriented to person, place, and time. He appears well-developed and well-nourished.  HENT:  Currently on 2 L of oxygen via nasal cannula  Cardiovascular: Normal rate, normal heart sounds and intact distal pulses.   No murmur heard. Pulmonary/Chest: Effort normal and breath sounds normal. He has no wheezes. He has no rales. He exhibits no tenderness.  Abdominal: Soft. Bowel sounds are normal. He exhibits no distension and no mass. There is no tenderness.  Musculoskeletal: Normal range of motion. He exhibits edema (1+ bilateral nonpitting pedal edema).  Neurological: He is alert and oriented to person, place, and time.  Psychiatric: He has a normal  mood and affect.     CMP Latest Ref Rng & Units 02/17/2017 02/17/2017 02/09/2017  Glucose 65 - 99 mg/dL 115(H) 118(H) 92  BUN 6 - 20 mg/dL 36(H) 32(H) 34(H)  Creatinine 0.61 - 1.24 mg/dL 2.90(H) 2.99(H) 3.12(H)  Sodium 135 - 145 mmol/L 143 141 139  Potassium 3.5 - 5.1 mmol/L 4.4 4.3 4.8  Chloride 101 - 111 mmol/L 97(L) 99(L) 99(L)  CO2 22 - 32 mmol/L - 36(H) 37(H)  Calcium 8.9 - 10.3 mg/dL - 8.3(L) 8.1(L)  Total Protein 6.5 - 8.1 g/dL - 7.0 -  Total Bilirubin 0.3 - 1.2 mg/dL - 0.9 -  Alkaline Phos 38 - 126 U/L - 33(L) -  AST 15 - 41 U/L - 18 -  ALT 17 - 63 U/L - 15(L) -  Lab Results  Component Value Date   HGBA1C 6.1 03/12/2017    Assessment & Plan:   1. Diabetes mellitus type 2 in obese (HCC) Diet controlled with A1c of 6.1 Continue diabetic diet, lifestyle modifications - POCT glucose (manual entry) - POCT glycosylated hemoglobin (Hb A1C)  2. Essential hypertension, benign Blood pressure is slightly elevated above goal of less than 130/80 but was 117/72 at his last visit No regimen changes at this time. Low-sodium, DASH diet  3. Other emphysema ([place]) Commenced on Symbicort and Proventil at his last office visit but he reports no improvement - Ambulatory referral to Pulmonology  4. CKD (chronic kidney disease) stage 4, GFR 15-29 ml/min (HCC) Combination of hypertensive and diabetic nephropathy Last creatinine was 2.9 Keep appointment with nephrology  5. Chronic combined systolic and diastolic CHF (congestive heart failure) (HCC) EF 45-50% from 08/2016 He does have chronic lower extremity edema Advised to increase oxygen to 4 L given hypoxia with a saturation of 80% at 2 L Continue medications Daily weights, limit fluid intake to 2 L per day Keep appointment with cardiology  6. Pneumonia of left lower lobe due to infectious organism [facility]) Complete her course of antibiotic - DG Chest 2 View; Future  7. Chronic respiratory failure with hypoxia (HCC) Currently  hypoxic at 80% on 2 L of oxygen I have advised him to increase back to 4 L of oxygen which he previously was on   No orders of the defined types were placed in this encounter.   Follow-up: Return in about 3 months (around 06/12/2017) for Follow-up of COPD .   [name] MD

## 2017-03-12 NOTE — Telephone Encounter (Signed)
Call placed to patient [phone] regarding his appointment with cardiologist. Patient informed me that he was able to make an appointment with Dr. Dixie Dials on 10/24 in the morning.

## 2017-04-08 ENCOUNTER — Encounter (HOSPITAL_COMMUNITY): Payer: Self-pay | Admitting: Emergency Medicine

## 2017-04-08 ENCOUNTER — Emergency Department (HOSPITAL_COMMUNITY): Payer: Medicare Other

## 2017-04-08 ENCOUNTER — Inpatient Hospital Stay (HOSPITAL_COMMUNITY)
Admission: EM | Admit: 2017-04-08 | Discharge: 2017-04-16 | DRG: 291 | Disposition: A | Discharge status: Home Health | Payer: Medicare Other | Attending: Student in an Organized Health Care Education/Training Program | Admitting: Student in an Organized Health Care Education/Training Program

## 2017-04-08 DIAGNOSIS — I130 Hypertensive heart and chronic kidney disease with heart failure and stage 1 through stage 4 chronic kidney disease, or unspecified chronic kidney disease: Secondary | ICD-10-CM | POA: Diagnosis not present

## 2017-04-08 DIAGNOSIS — E785 Hyperlipidemia, unspecified: Secondary | ICD-10-CM | POA: Diagnosis not present

## 2017-04-08 DIAGNOSIS — I5043 Acute on chronic combined systolic (congestive) and diastolic (congestive) heart failure: Secondary | ICD-10-CM | POA: Diagnosis not present

## 2017-04-08 DIAGNOSIS — E669 Obesity, unspecified: Secondary | ICD-10-CM

## 2017-04-08 DIAGNOSIS — I878 Other specified disorders of veins: Secondary | ICD-10-CM | POA: Diagnosis not present

## 2017-04-08 DIAGNOSIS — E1122 Type 2 diabetes mellitus with diabetic chronic kidney disease: Secondary | ICD-10-CM | POA: Diagnosis not present

## 2017-04-08 DIAGNOSIS — I10 Essential (primary) hypertension: Secondary | ICD-10-CM | POA: Diagnosis not present

## 2017-04-08 DIAGNOSIS — E872 Acidosis: Secondary | ICD-10-CM | POA: Diagnosis present

## 2017-04-08 DIAGNOSIS — I509 Heart failure, unspecified: Secondary | ICD-10-CM | POA: Diagnosis not present

## 2017-04-08 DIAGNOSIS — G4733 Obstructive sleep apnea (adult) (pediatric): Secondary | ICD-10-CM | POA: Diagnosis present

## 2017-04-08 DIAGNOSIS — J810 Acute pulmonary edema: Secondary | ICD-10-CM

## 2017-04-08 DIAGNOSIS — E7800 Pure hypercholesterolemia, unspecified: Secondary | ICD-10-CM | POA: Diagnosis present

## 2017-04-08 DIAGNOSIS — Z8249 Family history of ischemic heart disease and other diseases of the circulatory system: Secondary | ICD-10-CM | POA: Diagnosis not present

## 2017-04-08 DIAGNOSIS — N184 Chronic kidney disease, stage 4 (severe): Secondary | ICD-10-CM | POA: Diagnosis present

## 2017-04-08 DIAGNOSIS — Z833 Family history of diabetes mellitus: Secondary | ICD-10-CM | POA: Diagnosis not present

## 2017-04-08 DIAGNOSIS — Z7982 Long term (current) use of aspirin: Secondary | ICD-10-CM

## 2017-04-08 DIAGNOSIS — I5023 Acute on chronic systolic (congestive) heart failure: Secondary | ICD-10-CM | POA: Diagnosis not present

## 2017-04-08 DIAGNOSIS — Z7951 Long term (current) use of inhaled steroids: Secondary | ICD-10-CM | POA: Diagnosis not present

## 2017-04-08 DIAGNOSIS — E1121 Type 2 diabetes mellitus with diabetic nephropathy: Secondary | ICD-10-CM | POA: Diagnosis present

## 2017-04-08 DIAGNOSIS — J441 Chronic obstructive pulmonary disease with (acute) exacerbation: Secondary | ICD-10-CM | POA: Diagnosis not present

## 2017-04-08 DIAGNOSIS — Z9989 Dependence on other enabling machines and devices: Secondary | ICD-10-CM | POA: Diagnosis not present

## 2017-04-08 DIAGNOSIS — I340 Nonrheumatic mitral (valve) insufficiency: Secondary | ICD-10-CM | POA: Diagnosis not present

## 2017-04-08 DIAGNOSIS — J9622 Acute and chronic respiratory failure with hypercapnia: Secondary | ICD-10-CM | POA: Diagnosis not present

## 2017-04-08 DIAGNOSIS — R0902 Hypoxemia: Secondary | ICD-10-CM

## 2017-04-08 DIAGNOSIS — I5042 Chronic combined systolic (congestive) and diastolic (congestive) heart failure: Secondary | ICD-10-CM | POA: Diagnosis not present

## 2017-04-08 DIAGNOSIS — I081 Rheumatic disorders of both mitral and tricuspid valves: Secondary | ICD-10-CM | POA: Diagnosis not present

## 2017-04-08 DIAGNOSIS — Z6841 Body Mass Index (BMI) 40.0 and over, adult: Secondary | ICD-10-CM

## 2017-04-08 DIAGNOSIS — J9621 Acute and chronic respiratory failure with hypoxia: Secondary | ICD-10-CM | POA: Diagnosis not present

## 2017-04-08 DIAGNOSIS — I252 Old myocardial infarction: Secondary | ICD-10-CM

## 2017-04-08 DIAGNOSIS — J449 Chronic obstructive pulmonary disease, unspecified: Secondary | ICD-10-CM | POA: Diagnosis not present

## 2017-04-08 DIAGNOSIS — R0602 Shortness of breath: Secondary | ICD-10-CM | POA: Diagnosis not present

## 2017-04-08 DIAGNOSIS — Z9981 Dependence on supplemental oxygen: Secondary | ICD-10-CM

## 2017-04-08 DIAGNOSIS — K5900 Constipation, unspecified: Secondary | ICD-10-CM | POA: Diagnosis not present

## 2017-04-08 DIAGNOSIS — E662 Morbid (severe) obesity with alveolar hypoventilation: Secondary | ICD-10-CM | POA: Diagnosis not present

## 2017-04-08 DIAGNOSIS — Z79899 Other long term (current) drug therapy: Secondary | ICD-10-CM | POA: Diagnosis not present

## 2017-04-08 HISTORY — DX: Unspecified glaucoma: H40.9

## 2017-04-08 LAB — I-STAT ARTERIAL BLOOD GAS, ED
Acid-Base Excess: 9 mmol/L — ABNORMAL HIGH (ref 0.0–2.0)
BICARBONATE: 39.5 mmol/L — AB (ref 20.0–28.0)
O2 Saturation: 82 %
PCO2 ART: 79.8 mmHg — AB (ref 32.0–48.0)
TCO2: 42 mmol/L — ABNORMAL HIGH (ref 22–32)
pH, Arterial: 7.299 — ABNORMAL LOW (ref 7.350–7.450)
pO2, Arterial: 52 mmHg — ABNORMAL LOW (ref 83.0–108.0)

## 2017-04-08 LAB — COMPREHENSIVE METABOLIC PANEL
ALBUMIN: 3.2 g/dL — AB (ref 3.5–5.0)
ALT: 15 U/L — ABNORMAL LOW (ref 17–63)
ANION GAP: 9 (ref 5–15)
AST: 23 U/L (ref 15–41)
Alkaline Phosphatase: 46 U/L (ref 38–126)
BILIRUBIN TOTAL: 1.3 mg/dL — AB (ref 0.3–1.2)
BUN: 34 mg/dL — AB (ref 6–20)
CHLORIDE: 98 mmol/L — AB (ref 101–111)
CO2: 31 mmol/L (ref 22–32)
Calcium: 8.5 mg/dL — ABNORMAL LOW (ref 8.9–10.3)
Creatinine, Ser: 3.21 mg/dL — ABNORMAL HIGH (ref 0.61–1.24)
GFR calc Af Amer: 23 mL/min — ABNORMAL LOW (ref >60)
GFR calc non Af Amer: 20 mL/min — ABNORMAL LOW (ref >60)
GLUCOSE: 101 mg/dL — AB (ref 65–99)
POTASSIUM: 4.7 mmol/L (ref 3.5–5.1)
SODIUM: 138 mmol/L (ref 135–145)
TOTAL PROTEIN: 8.1 g/dL (ref 6.5–8.1)

## 2017-04-08 LAB — CBC
HEMATOCRIT: 40.2 % (ref 39.0–52.0)
HEMOGLOBIN: 11.6 g/dL — AB (ref 13.0–17.0)
MCH: 25.2 pg — AB (ref 26.0–34.0)
MCHC: 28.9 g/dL — ABNORMAL LOW (ref 30.0–36.0)
MCV: 87.2 fL (ref 78.0–100.0)
Platelets: 196 10*3/uL (ref 150–400)
RBC: 4.61 MIL/uL (ref 4.22–5.81)
RDW: 21.1 % — ABNORMAL HIGH (ref 11.5–15.5)
WBC: 6.9 10*3/uL (ref 4.0–10.5)

## 2017-04-08 LAB — I-STAT TROPONIN, ED: TROPONIN I, POC: 0.02 ng/mL (ref 0.00–0.08)

## 2017-04-08 LAB — BRAIN NATRIURETIC PEPTIDE: B NATRIURETIC PEPTIDE 5: 670.3 pg/mL — AB (ref 0.0–100.0)

## 2017-04-08 MED ORDER — HEPARIN SODIUM (PORCINE) 5000 UNIT/ML IJ SOLN
5000.0000 [IU] | Freq: Three times a day (TID) | INTRAMUSCULAR | Status: DC
  Administered 2017-04-09 – 2017-04-16 (×22): 5000 [IU] via SUBCUTANEOUS
  Filled 2017-04-08 (×22): qty 1

## 2017-04-08 MED ORDER — CARVEDILOL 12.5 MG PO TABS: 6.2500 mg | ORAL_TABLET | Freq: Two times a day (BID) | ORAL | Status: DC

## 2017-04-08 MED ORDER — ATORVASTATIN CALCIUM 40 MG PO TABS
40.0000 mg | ORAL_TABLET | Freq: Every day | ORAL | Status: DC
  Administered 2017-04-10 – 2017-04-16 (×7): 40 mg via ORAL
  Filled 2017-04-08 (×8): qty 1

## 2017-04-08 MED ORDER — ALBUTEROL (5 MG/ML) CONTINUOUS INHALATION SOLN
10.0000 mg/h | INHALATION_SOLUTION | RESPIRATORY_TRACT | Status: DC
  Administered 2017-04-08: 10 mg/h via RESPIRATORY_TRACT
  Filled 2017-04-08: qty 20

## 2017-04-08 MED ORDER — IPRATROPIUM BROMIDE 0.02 % IN SOLN
0.5000 mg | Freq: Once | RESPIRATORY_TRACT | Status: AC
  Administered 2017-04-08: 0.5 mg via RESPIRATORY_TRACT
  Filled 2017-04-08: qty 2.5

## 2017-04-08 MED ORDER — MOMETASONE FURO-FORMOTEROL FUM 200-5 MCG/ACT IN AERO
2.0000 | INHALATION_SPRAY | Freq: Two times a day (BID) | RESPIRATORY_TRACT | Status: DC
  Administered 2017-04-10 – 2017-04-16 (×12): 2 via RESPIRATORY_TRACT
  Filled 2017-04-08: qty 8.8

## 2017-04-08 MED ORDER — ALLOPURINOL 100 MG PO TABS
100.0000 mg | ORAL_TABLET | Freq: Every day | ORAL | Status: DC
  Filled 2017-04-08: qty 1

## 2017-04-08 MED ORDER — IPRATROPIUM-ALBUTEROL 0.5-2.5 (3) MG/3ML IN SOLN
3.0000 mL | RESPIRATORY_TRACT | Status: DC
  Administered 2017-04-08 – 2017-04-12 (×21): 3 mL via RESPIRATORY_TRACT
  Filled 2017-04-08 (×20): qty 3

## 2017-04-08 MED ORDER — PREDNISONE 20 MG PO TABS: 60.0000 mg | ORAL_TABLET | Freq: Every day | ORAL | Status: DC

## 2017-04-08 MED ORDER — ISOSORBIDE MONONITRATE ER 30 MG PO TB24
30.0000 mg | ORAL_TABLET | Freq: Every day | ORAL | Status: DC
  Administered 2017-04-10 – 2017-04-16 (×7): 30 mg via ORAL
  Filled 2017-04-08 (×7): qty 1

## 2017-04-08 MED ORDER — ASPIRIN 325 MG PO TABS
325.0000 mg | ORAL_TABLET | Freq: Every day | ORAL | Status: DC
  Administered 2017-04-10 – 2017-04-16 (×7): 325 mg via ORAL
  Filled 2017-04-08 (×7): qty 1

## 2017-04-08 MED ORDER — METHYLPREDNISOLONE SODIUM SUCC 125 MG IJ SOLR
125.0000 mg | Freq: Once | INTRAMUSCULAR | Status: AC
  Administered 2017-04-08: 125 mg via INTRAVENOUS
  Filled 2017-04-08: qty 2

## 2017-04-08 MED ORDER — LATANOPROST 0.005 % OP SOLN
1.0000 [drp] | Freq: Every day | OPHTHALMIC | Status: DC
  Administered 2017-04-09 – 2017-04-15 (×7): 1 [drp] via OPHTHALMIC
  Filled 2017-04-08 (×2): qty 2.5

## 2017-04-08 MED ORDER — FUROSEMIDE 10 MG/ML IJ SOLN
60.0000 mg | Freq: Once | INTRAMUSCULAR | Status: AC
  Administered 2017-04-08: 60 mg via INTRAVENOUS
  Filled 2017-04-08: qty 6

## 2017-04-08 MED ORDER — DOCUSATE SODIUM 100 MG PO CAPS
100.0000 mg | ORAL_CAPSULE | Freq: Two times a day (BID) | ORAL | Status: DC
  Administered 2017-04-10 – 2017-04-16 (×13): 100 mg via ORAL
  Filled 2017-04-08 (×13): qty 1

## 2017-04-08 MED ORDER — FUROSEMIDE 10 MG/ML IJ SOLN
80.0000 mg | Freq: Two times a day (BID) | INTRAMUSCULAR | Status: DC
  Administered 2017-04-09 – 2017-04-10 (×4): 80 mg via INTRAVENOUS
  Filled 2017-04-08 (×4): qty 8

## 2017-04-08 MED ORDER — HYDRALAZINE HCL 25 MG PO TABS: 25.0000 mg | ORAL_TABLET | Freq: Three times a day (TID) | ORAL | Status: DC

## 2017-04-08 NOTE — Progress Notes (Signed)
Pt placed on Bipap, 100% sat 91%. Pt tolerating well at this time.  RT will continue to follow.

## 2017-04-08 NOTE — ED Triage Notes (Signed)
Pt to ER with complaint of shortness of breath. Onset 2 days ago, hx of CHF, on arrival patient dyspneic at rest with sats 60% on home O2 of 4L. Pt is awake, alert and responding appropriately. Placed on NRB with improvement of sats to 94%

## 2017-04-08 NOTE — Progress Notes (Signed)
ABG critical values given to Dr Leonette Monarch.  Bipap ordered.

## 2017-04-08 NOTE — ED Provider Notes (Signed)
[facility] EMERGENCY DEPARTMENT Provider Note  CSN: [id number] Arrival date & time: 04/08/17 1726  Chief Complaint(s) Shortness of Breath  HPI Adam Dennis is a 56 y.o. male with a history of congestive heart failure with an EF of 40-45%, COPD who presents to the emergency department with worsening shortness of breath over the past 2-3 days with associated dyspnea on exertion, orthopnea and peripheral swelling.  Patient also noted that he was hypoxic with saturations in the 50s at home today.  He endorses dry cough for several days as well.  No recent fevers, URI symptoms, chills, nausea, vomiting, chest pain, abdominal pain, urinary symptoms.  Patient reports being compliant with his home medications.  He is a non-smoker.   Shortness of Breath  This is a recurrent problem. The problem occurs continuously.The current episode started 2 days ago. The problem has been gradually worsening. The treatment provided no relief. He has had prior hospitalizations. Associated medical issues include COPD and heart failure.    Past Medical History Past Medical History:  Diagnosis Date  . Arthritis    "back" (02/06/2017)  . CHF (congestive heart failure) ([place])   . Chronic combined systolic and diastolic heart failure ([place])   . CKD (chronic kidney disease) stage 4, GFR 15-29 ml/min (HCC)   . High cholesterol   . Hypertension   . Hypertrophy of tonsils alone   . Myocardial infarction [facility])    "small one; ~ 2008" (02/06/2017)  . Obesity, unspecified   . On home oxygen therapy    "2-4L; 24/7" (02/06/2017)  . OSA on CPAP   . Type II diabetes mellitus [facility])    Patient Active Problem List   Diagnosis Date Noted  . COPD (chronic obstructive pulmonary disease) ([place]) 02/21/2017  . Acute systolic heart failure ([place]) 02/06/2017  . Acute on chronic systolic (congestive) heart failure ([place]) 01/15/2017  . Right flank pain 12/22/2016  . Chronic respiratory failure ([place]) 09/19/2016  .  Vitamin D insufficiency 12/03/2015  . Glaucoma of right eye 11/30/2015  . CKD (chronic kidney disease) stage 4, GFR 15-29 ml/min (HCC) 10/04/2015  . Erectile dysfunction 06/26/2015  . O2 dependent 04/05/2015  . Diabetic nephropathy associated with type 2 diabetes mellitus ([place]) 02/13/2015  . Diabetes mellitus type 2 in obese ([place]) 02/10/2015  . Chronic combined systolic and diastolic CHF (congestive heart failure) ([place]) 02/10/2015  . Depression 02/10/2015  . Tinea pedis 02/10/2015  . Onychomycosis 02/10/2015  . Obesity hypoventilation syndrome ([place]) 02/10/2015  . Essential hypertension, benign 08/16/2013  . Dry skin 02/26/2013  . Hyperlipidemia 11/14/2008  . Morbid obesity ([place]) 03/10/2007  . Obstructive sleep apnea 03/10/2007  . HYPERTROPHY, TONSILS 03/10/2007   Home Medication(s) Prior to Admission medications   Medication Sig Start Date End Date Taking? Authorizing Provider  albuterol (PROVENTIL HFA;VENTOLIN HFA) 108 (90 Base) MCG/ACT inhaler Inhale 2 puffs into the lungs every 6 (six) hours as needed for wheezing or shortness of breath. 02/21/17   [name], MD  allopurinol (ZYLOPRIM) 100 MG tablet Take 1 tablet (100 mg total) by mouth daily. 02/11/17   Dixie Dials, MD  aspirin 325 MG tablet Take 325 mg by mouth daily.    [provider]  atorvastatin (LIPITOR) 40 MG tablet Take 1 tablet (40 mg total) by mouth daily. 12/16/16   Funches, Adriana Mccallum, MD  Blood Glucose Monitoring Suppl (ACCU-CHEK AVIVA) device Use as instructed daily. 02/05/17   [name], MD  budesonide-formoterol (SYMBICORT) 160-4.5 MCG/ACT inhaler Inhale 2 puffs into the  lungs 2 (two) times daily. 02/21/17   [name], MD  calcium carbonate (TUMS - DOSED IN MG ELEMENTAL CALCIUM) 500 MG chewable tablet Chew 1 tablet (200 mg of elemental calcium total) by mouth 2 (two) times daily. 11/27/14   Dixie Dials, MD  carvedilol (COREG) 6.25 MG tablet Take 6.25 mg by mouth 2 (two) times daily. 01/24/17   [provider]  furosemide (LASIX) 40 MG tablet Take 1 tablet (40 mg total) by mouth 2 (two) times daily. 02/11/17   Dixie Dials, MD  glucose blood (ACCU-CHEK AVIVA) test strip Use as instructed daily 02/05/17   [name], MD  hydrALAZINE (APRESOLINE) 50 MG tablet Take 0.5 tablets (25 mg total) by mouth 3 (three) times daily. 12/22/16   Funches, Adriana Mccallum, MD  isosorbide mononitrate (IMDUR) 30 MG 24 hr tablet Take 1 tablet (30 mg total) by mouth daily. 12/22/16   Boykin Nearing, MD  Lancet Devices [facility]) lancets Use as instructed daily. 02/05/17   [name], MD  lisinopril (PRINIVIL,ZESTRIL) 20 MG tablet Take 20 mg by mouth daily. 01/18/17   [provider]  nitroGLYCERIN (NITROSTAT) 0.4 MG SL tablet Place 1 tablet (0.4 mg total) under the tongue every 5 (five) minutes x 3 doses as needed for chest pain. 12/17/12   Dixie Dials, MD  NON FORMULARY 1 each by Other route at bedtime. CPAP MACHINE    [provider]  OXYGEN Inhale 4 L into the lungs daily.    [provider]  TRAVATAN Z 0.004 % SOLN ophthalmic solution Place 1 drop into both eyes at bedtime. 11/01/15   [provider]                                                                                                                                    Past Surgical History Past Surgical History:  Procedure Laterality Date  . HERNIA REPAIR    . UMBILICAL HERNIA REPAIR     Family History Family History  Problem Relation Age of Onset  . Hypertension Mother   . Diabetes Brother   . Diabetes Sister   . Cancer Maternal Aunt     Social History Social History   Tobacco Use  . Smoking status: Never Smoker  . Smokeless tobacco: Never Used  Substance Use Topics  . Alcohol use: No  . Drug use: No   Allergies Patient has no known allergies.  Review of Systems Review of Systems  Respiratory: Positive for shortness of breath.    All other systems are reviewed and are negative for  acute change except as noted in the HPI  Physical Exam Vital Signs  I have reviewed the triage vital signs BP 123/62 (BP Location: Left Arm)   Pulse 89   Temp 97.8 F (36.6 C) (Oral)   Resp (!) 28   SpO2 (!) 57%   Physical Exam  Constitutional: He is oriented to person, place,  and time. He appears well-developed and well-nourished. No distress. Face mask in place.  HENT:  Head: Normocephalic and atraumatic.  Nose: Nose normal.  Eyes: Conjunctivae and EOM are normal. Pupils are equal, round, and reactive to light. Right eye exhibits no discharge. Left eye exhibits no discharge. No scleral icterus.  Neck: Normal range of motion. Neck supple.  Cardiovascular: Normal rate and regular rhythm. Exam reveals no gallop and no friction rub.  No murmur heard. Pulmonary/Chest: Effort normal and breath sounds normal. No stridor. Tachypnea noted. No respiratory distress. He has no rales.  Decreased air movement throughout.  Abdominal: Soft. He exhibits no distension. There is no tenderness.  Musculoskeletal: He exhibits no edema or tenderness.  3+ bilateral lower extremity edema.  Neurological: He is alert and oriented to person, place, and time.  Skin: Skin is warm and dry. No rash noted. He is not diaphoretic. No erythema.  Psychiatric: He has a normal mood and affect.  Vitals reviewed.   ED Results and Treatments Labs (all labs ordered are listed, but only abnormal results are displayed) Labs Reviewed  COMPREHENSIVE METABOLIC PANEL - Abnormal; Notable for the following components:      Result Value   Chloride 98 (*)    Glucose, Bld 101 (*)    BUN 34 (*)    Creatinine, Ser 3.21 (*)    Calcium 8.5 (*)    Albumin 3.2 (*)    ALT 15 (*)    Total Bilirubin 1.3 (*)    GFR calc non Af Amer 20 (*)    GFR calc Af Amer 23 (*)    All other components within normal limits  CBC - Abnormal; Notable for the following components:   Hemoglobin 11.6 (*)    MCH 25.2 (*)    MCHC 28.9 (*)    RDW  21.1 (*)    All other components within normal limits  I-STAT ARTERIAL BLOOD GAS, ED - Abnormal; Notable for the following components:   pH, Arterial 7.299 (*)    pCO2 arterial 79.8 (*)    pO2, Arterial 52.0 (*)    Bicarbonate 39.5 (*)    TCO2 42 (*)    Acid-Base Excess 9.0 (*)    All other components within normal limits  BLOOD GAS, ARTERIAL  BRAIN NATRIURETIC PEPTIDE  I-STAT TROPONIN, ED                                                                                                                         EKG  EKG Interpretation  Date/Time:  Tuesday April 08 2017 17:34:36 EST Ventricular Rate:  91 PR Interval:  124 QRS Duration: 88 QT Interval:  400 QTC Calculation: 492 R Axis:   163 Text Interpretation:  Sinus rhythm with marked sinus arrhythmia Lateral infarct , age undetermined Abnormal ECG Artifact No significant change since last tracing Confirmed by Addison Lank [phone]) on 04/08/2017 6:42:22 PM      Radiology Dg Chest Port 1 View  Result Date:  04/08/2017 CLINICAL DATA:  56 year old male with shortness of breath. History of CHF and hypertension. EXAM: PORTABLE CHEST 1 VIEW COMPARISON:  Chest radiograph dated 02/17/2017 FINDINGS: There is cardiomegaly with vascular congestion. Overall worsened degree of congestion compared to the prior radiograph. Areas of hazy opacity at the lung bases may be combination of atelectasis and congestion, although pneumonia is not excluded. Clinical correlation is recommended. There is no large pleural effusion. No pneumothorax. No acute osseous pathology. IMPRESSION: Cardiomegaly with findings of CHF, worsened compared to prior radiograph. Pneumonia is not excluded. Clinical correlation is recommended. Electronically Signed   By: Anner Crete M.D.   On: 04/08/2017 18:22   Pertinent labs & imaging results that were available during my care of the patient were reviewed by me and considered in my medical decision making (see chart for  details).  Medications Ordered in ED Medications  albuterol (PROVENTIL,VENTOLIN) solution continuous neb (10 mg/hr Nebulization New Bag/Given 04/08/17 1816)  furosemide (LASIX) injection 60 mg (not administered)  ipratropium (ATROVENT) nebulizer solution 0.5 mg (0.5 mg Nebulization Given 04/08/17 1815)  methylPREDNISolone sodium succinate (SOLU-MEDROL) 125 mg/2 mL injection 125 mg (125 mg Intravenous Given 04/08/17 1944)                                                                                                                                    Procedures Procedures CRITICAL CARE Performed by: Grayce Sessions Nathyn Luiz Total critical care time: 35 minutes Critical care time was exclusive of separately billable procedures and treating other patients. Critical care was necessary to treat or prevent imminent or life-threatening deterioration. Critical care was time spent personally by me on the following activities: development of treatment plan with patient and/or surrogate as well as nursing, discussions with consultants, evaluation of patient's response to treatment, examination of patient, obtaining history from patient or surrogate, ordering and performing treatments and interventions, ordering and review of laboratory studies, ordering and review of radiographic studies, pulse oximetry and re-evaluation of patient's condition.   (including critical care time)  Medical Decision Making / ED Course I have reviewed the nursing notes for this encounter and the patient's prior records (if available in EHR or on provided paperwork).    Presentation consistent with superimposed CHF exacerbation with COPD exacerbation.  Patient noted to be hypoxic in triage with sats in the 50s which improved with nonrebreather.  ABG consistent with respiratory acidosis.  Patient provided with Solu-Medrol, continuous DuoNeb and placed on BiPAP.  Chest x-ray with diffuse pulmonary edema.  Renal function at  baseline.  Given IV Lasix and admitted to medicine for further management.  Final Clinical Impression(s) / ED Diagnoses Final diagnoses:  SOB (shortness of breath)  COPD exacerbation (HCC)  Acute on chronic combined systolic and diastolic congestive heart failure ([place])      This chart was dictated using voice recognition software.  Despite best efforts to proofread,  errors can occur which can change  the documentation meaning.   Fatima Blank, MD 04/08/17 2040

## 2017-04-08 NOTE — H&P (Signed)
Date: 04/09/2017               Patient Name:  Adam Dennis MRN: [id number]  DOB: 1961/02/22 Age / Sex: 56 y.o., male   PCP: [name], MD         Medical Service: Internal Medicine Teaching Service         Attending Physician: Dr. Bartholomew Crews, MD    First Contact: Dr. Chilton Greathouse Pager: [phone]  Second Contact: Dr. Velna Ochs Pager: [phone]       After Hours (After 5p/  First Contact Pager: [phone]  weekends / holidays): Second Contact Pager: [phone]   Chief Complaint:  Progressive SOB with exertion  History of Present Illness:  Adam Dennis is a 56 yo with a PMH of combined systolic and diastolic heart failure (LVEF 45-50%, G1DD on echo 08/2016), HTN, T2DM, HLD, CKD stage 4, COPD, OSA on CPAP, and chronic respiratory failure on 4L home oxygen who presents with a 2 day history of SOB. The history was obtained directly from patient, however it was a difficult interview as patient remained on BiPAP mask for treatment of hypoxia throughout interview. Patient states that he first noticed his symptoms about 2 days ago. He notices the shortness of breath most while walking around and while laying down. It has significantly progressed to the point where he cannot walk a few steps without gasping for air. He also cannot sleep on his usual two pillows at home. Over the past two nights when he does this he recalls waking up gasping for air and having to sleep sitting up as a result. He has intermittently increased his home oxygen levels past 4L with some improvement of his symptoms, but he doesn't do this often because he is afraid he will run out of home oxygen. The patient typically requires oxygen when exerting himself and states he does not use it if standing around in one spot as he does on a typical workday as a Biomedical scientist. He also reports swelling of lower extremities, possible increased abdominal girth, and constipation for the past 3 days. He denies chest pain,  palpitations, diaphoresis, nausea, vomiting, productive cough, fevers, sick contacts, abdominal pain, and difficulty with urination. Patient states that he takes all medications as prescribed and adheres to a fluid restricted diet, stating that he has a cup at home that he only allows himself to fill up 7-8 times per day. He also weighs himself daily, although he does not know what his most recent weights are.   In ED the patient was hypoxic to 60's on his home 4L of oxygen. A STAT ABG showed pH of 7.299 with ABG pCO2 79.8, pO2 52.0 and bicarb of 31. Respiratory therapy was called and the patient was immediately placed on BiPAP with resolution of his hypoxia. On further workup his BNP was elevated to 670 and CXR showed cardiomegaly, vascular congestion, and interstitial edema. Internal medicine was called for admission.   Meds:  No outpatient medications have been marked as taking for the 04/08/17 encounter [facility] Encounter).   Allergies: Allergies as of 04/08/2017  . (No Known Allergies)   Past Medical History: Past Medical History:  Diagnosis Date  . Arthritis    "back" (02/06/2017)  . CHF (congestive heart failure) ([place])   . Chronic combined systolic and diastolic heart failure ([place])   . CKD (chronic kidney disease) stage 4, GFR 15-29 ml/min (HCC)   . High cholesterol   . Hypertension   .  Hypertrophy of tonsils alone   . Myocardial infarction [facility])    "small one; ~ 2008" (02/06/2017)  . Obesity, unspecified   . On home oxygen therapy    "2-4L; 24/7" (02/06/2017)  . OSA on CPAP   . Type II diabetes mellitus ([place])    Past Surgical History: Past Surgical History:  Procedure Laterality Date  . HERNIA REPAIR    . UMBILICAL HERNIA REPAIR     Family History:  Family History  Problem Relation Age of Onset  . Hypertension Mother   . Diabetes Brother   . Diabetes Sister   . Cancer Maternal Aunt    Social History:  Patient lives with girlfriend and works as a Biomedical scientist at [employer]. Patient denies tobacco use, alcohol use, and illicit drug use.   Review of Systems: A complete ROS was negative except as per HPI.   Physical Exam: Blood pressure 136/66, pulse 85, temperature 97.8 F (36.6 C), temperature source Oral, resp. rate (!) 23, SpO2 94 %.  Physical Exam  Constitutional:  Obese gentleman sitting comfortably on edge of bed wearing BiPAP, diaphoretic but in no acute distress.   HENT:  Mouth/Throat: Oropharynx is clear and moist. No oropharyngeal exudate.  Eyes: EOM are normal.  Neck: No JVD present.  Cardiovascular: Normal rate, regular rhythm and normal heart sounds. Exam reveals no gallop and no friction rub.  No murmur heard. 2+ radial pulses bilaterally, 1+ posterior tibial pulses bilaterally  Respiratory:  Patient wearing BiPAP mask. No accessory muscle use or nasal flaring. Decreased breath sounds at bilateral lung bases, without wheezing or crackles throughout all lung fields.  GI: Soft. Bowel sounds are normal. He exhibits no distension. There is no tenderness. There is no rebound.  Musculoskeletal: He exhibits edema (Trace - 1+ pitting edema of bilateral lower extremities to mid shins). He exhibits no tenderness.  Lymphadenopathy:    He has no cervical adenopathy.  Skin: Skin is warm and dry. No rash noted. No erythema.  Patient has chronic dry skin of bilateral lower extremities without evidence of overlying ulceration or hyperpigmentation.    EKG: personally reviewed my interpretation is sinus rhythm, regular rate without signs of ST elevation or T wave inversion suggesting ischemia. Stable EKG from previous tests on file.  CXR: personally reviewed my interpretation is low lung volumes with increased vascular congestion and interstitial edema bilaterally.  Assessment & Plan by Problem: Active Problems:   Acute on chronic combined systolic (congestive) and diastolic (congestive) heart failure (HCC)  Adam Dennis is a 56 yo with a PMH  of combined systolic and diastolic heart failure (LVEF 45-50%, G1DD on echo 08/2016), HTN, T2DM, HLD, CKD stage 4, COPD, OSA on CPAP, and chronic respiratory failure on 4L home oxygen who presents with a 2 day history of SOB with signs and symptoms of volume overload on initial exam and workup. He was admitted to the internal medicine teaching service for management. The specific problems addressed during admission were as follows:  Acute on chronic hypercapnic respiratory failure with hypoxia: Patient typically on 4L [place] at home, but reports difficulty with use of home oxygen since his oxygen tanks frequently run out. Patient endorses orthopnea, PND, and has vascular congestion with interstitial edema on chest imaging. This is all consistent with acute CHF exacerbation. It is unclear why the patient has acutely decompensated, as he reports compliance with medications and fluid restricted diet. Will obtain echocardiography to ensure baseline EF has not worsened. The patient endorses a history of  COPD, but denies significant increase in wheezing, fevers, sick contacts, and productive cough over the past 2 days. He does not have wheezing on exam, but he was examined after receiving IV steroids and breathing treatments in ED. It is less likely that patient is currently having a COPD exacerbation, however will continue treatment with oral steroids and scheduled breathing treatments to prevent acute decompensation of respiratory status if this does play a role in his current presentation. His acute hypoxia is less likely to be a result of PE or pneumonia, as patient denies fevers, unilateral leg swelling, recent travel, chest pain, and productive cough. Will proceed with IV diuresis and continue current respiratory support.  -Admit to stepdown with continuous cardiorespiratory monitoring -Continue BiPAP, wean as tolerated -IV lasix 80 mg BID -Daily weights with strict I&O -Repeat Echocardiography (last in  08/2016) -Prednisone 60 mg for 4 days -Scheduled Duonebs and Dulera with PRN albuterol -Continue home coreg 6.25 mg BID  CKD stage 4: Patient's Cr near baseline of 2.9-3.2 per chart review. Patient has eGFR of 24-26, currently 54. Will continue to monitor while on diuretic therapy.  -Daily BMP  T2DM: Currently diet controlled at home with last A1C = 6.1% on 03/12/2017. -Carb modified diet while inpatient  HTN: Patient normotensive in ED -Continue home Imdur 30 mg daily and hydralazine TID  Fluids/electrolytes: None/replace as needed Nutrition: NPO while on BiPAP, heart healthy & carb modified VTE prophylaxis: Heparin TID Code Status: Full  Dispo: Admit patient to Observation with expected length of stay less than 2 midnights.  SignedThomasene Ripple, MD 04/09/2017, 12:45 AM  Pager: [phone]

## 2017-04-09 ENCOUNTER — Inpatient Hospital Stay (HOSPITAL_COMMUNITY): Payer: Medicare Other

## 2017-04-09 DIAGNOSIS — I878 Other specified disorders of veins: Secondary | ICD-10-CM

## 2017-04-09 DIAGNOSIS — I509 Heart failure, unspecified: Secondary | ICD-10-CM

## 2017-04-09 DIAGNOSIS — I5043 Acute on chronic combined systolic (congestive) and diastolic (congestive) heart failure: Secondary | ICD-10-CM

## 2017-04-09 DIAGNOSIS — J9621 Acute and chronic respiratory failure with hypoxia: Secondary | ICD-10-CM

## 2017-04-09 DIAGNOSIS — J449 Chronic obstructive pulmonary disease, unspecified: Secondary | ICD-10-CM

## 2017-04-09 DIAGNOSIS — G4733 Obstructive sleep apnea (adult) (pediatric): Secondary | ICD-10-CM

## 2017-04-09 DIAGNOSIS — J9622 Acute and chronic respiratory failure with hypercapnia: Secondary | ICD-10-CM

## 2017-04-09 LAB — CBC
HEMATOCRIT: 41.3 % (ref 39.0–52.0)
HEMOGLOBIN: 12 g/dL — AB (ref 13.0–17.0)
MCH: 25.5 pg — ABNORMAL LOW (ref 26.0–34.0)
MCHC: 29.1 g/dL — AB (ref 30.0–36.0)
MCV: 87.7 fL (ref 78.0–100.0)
Platelets: 227 10*3/uL (ref 150–400)
RBC: 4.71 MIL/uL (ref 4.22–5.81)
RDW: 21.1 % — ABNORMAL HIGH (ref 11.5–15.5)
WBC: 7.5 10*3/uL (ref 4.0–10.5)

## 2017-04-09 LAB — I-STAT ARTERIAL BLOOD GAS, ED
Acid-Base Excess: 10 mmol/L — ABNORMAL HIGH (ref 0.0–2.0)
Bicarbonate: 39.3 mmol/L — ABNORMAL HIGH (ref 20.0–28.0)
O2 SAT: 94 %
PCO2 ART: 74.6 mmHg — AB (ref 32.0–48.0)
PH ART: 7.329 — AB (ref 7.350–7.450)
TCO2: 42 mmol/L — ABNORMAL HIGH (ref 22–32)
pO2, Arterial: 81 mmHg — ABNORMAL LOW (ref 83.0–108.0)

## 2017-04-09 LAB — BASIC METABOLIC PANEL
ANION GAP: 10 (ref 5–15)
BUN: 35 mg/dL — ABNORMAL HIGH (ref 6–20)
CO2: 31 mmol/L (ref 22–32)
Calcium: 8.6 mg/dL — ABNORMAL LOW (ref 8.9–10.3)
Chloride: 98 mmol/L — ABNORMAL LOW (ref 101–111)
Creatinine, Ser: 3.22 mg/dL — ABNORMAL HIGH (ref 0.61–1.24)
GFR calc Af Amer: 23 mL/min — ABNORMAL LOW (ref >60)
GFR, EST NON AFRICAN AMERICAN: 20 mL/min — AB (ref >60)
GLUCOSE: 110 mg/dL — AB (ref 65–99)
POTASSIUM: 4.4 mmol/L (ref 3.5–5.1)
Sodium: 139 mmol/L (ref 135–145)

## 2017-04-09 LAB — ECHOCARDIOGRAM COMPLETE

## 2017-04-09 LAB — D-DIMER, QUANTITATIVE: D-Dimer, Quant: 0.75 ug/mL-FEU — ABNORMAL HIGH (ref 0.00–0.50)

## 2017-04-09 LAB — CBG MONITORING, ED
GLUCOSE-CAPILLARY: 122 mg/dL — AB (ref 65–99)
GLUCOSE-CAPILLARY: 118 mg/dL — AB (ref 65–99)
GLUCOSE-CAPILLARY: 100 mg/dL — AB (ref 65–99)

## 2017-04-09 LAB — GLUCOSE, CAPILLARY: Glucose-Capillary: 92 mg/dL (ref 65–99)

## 2017-04-09 LAB — MAGNESIUM: MAGNESIUM: 2.4 mg/dL (ref 1.7–2.4)

## 2017-04-09 MED ORDER — HYDRALAZINE HCL 20 MG/ML IJ SOLN
10.0000 mg | Freq: Three times a day (TID) | INTRAMUSCULAR | Status: DC
  Administered 2017-04-09 – 2017-04-16 (×3): 10 mg via INTRAVENOUS
  Filled 2017-04-09 (×9): qty 1

## 2017-04-09 MED ORDER — FUROSEMIDE 10 MG/ML IJ SOLN
100.0000 mg | Freq: Once | INTRAVENOUS | Status: AC
  Administered 2017-04-09: 100 mg via INTRAVENOUS
  Filled 2017-04-09: qty 10

## 2017-04-09 MED ORDER — METOLAZONE 5 MG PO TABS
5.0000 mg | ORAL_TABLET | Freq: Once | ORAL | Status: DC
  Filled 2017-04-09: qty 1

## 2017-04-09 MED ORDER — PERFLUTREN LIPID MICROSPHERE
1.0000 mL | INTRAVENOUS | Status: AC | PRN
  Administered 2017-04-09: 2 mL via INTRAVENOUS
  Filled 2017-04-09: qty 10

## 2017-04-09 NOTE — ED Notes (Signed)
Pt called for V/Q scan however because pt is on BiPap V/Q scan postponed until pt off of BiPap.

## 2017-04-09 NOTE — ED Notes (Signed)
Patient placed on hospital bed.

## 2017-04-09 NOTE — Progress Notes (Signed)
  Echocardiogram 2D Echocardiogram has been performed.  Bobbye Charleston 04/09/2017, 4:42 PM

## 2017-04-09 NOTE — Progress Notes (Signed)
Patient noted to be followed at the [facility] last visit was 03/12/17. ED CM will notify Eden Lathe RN CM at clinic of patient's hospitalization to continue to follow for care transitioning planning.

## 2017-04-09 NOTE — Progress Notes (Signed)
Paged MD. Patient on Bipap and cannot take PO meds. Requesting parameters for hydralazine too.

## 2017-04-09 NOTE — Progress Notes (Signed)
Transferred to [place] on BiPAP. No problems, patient tolerated well. Resting comfortably on BiPAP

## 2017-04-09 NOTE — Progress Notes (Signed)
   Subjective:  No acute events overnight. Patient continues to require BiPAP to maintain good oxygenation and desats to 50s when on nasal cannula. States he feels better than yesterday, but not back to baseline. States uses O2 PRN at work 2x/week.   Objective:  Vital signs in last 24 hours: Vitals:   04/09/17 0737 04/09/17 0829 04/09/17 0922 04/09/17 1135  BP: 105/66 103/60 (!) 148/68   Pulse:  74 85   Resp:  (!) 24 (!) 35 (!) 26  Temp:      TempSrc:      SpO2:  92% (!) 89%    General: very pleasant male, obese, well-developed, significantly hypoxic when speaking but O2 sats noted to improve with deep breathing. No acute distress when seen.  Cardiac: tachycardic with regular rhythm, nl S1/S2, no murmurs, rubs or gallops, JVD 2 cm above clavicle  Pulm: decreased breath sounds at the bases, no wheezes or crackles, no increased work of breathing while on BiPAP Abd: soft, NTND, hypoactive bowel sounds  Neuro: A&Ox3, able to move all 4 extremities with no focal deficits noted  Ext: warm and well perfused, 1+ pitting edema  bilaterally, 2+ DP pulses bilaterally    Assessment/Plan:  Adam Dennis is a 56 yo with a PMH of combined systolic and diastolic heart failure (LVEF 45-50%, G1DD on echo 08/2016), HTN, T2DM, HLD, CKD stage 4, COPD, OSA on CPAP, and chronic respiratory failure on 4L home oxygen who presents with a 2 day history of SOB with signs and symptoms of volume overload on initial exam and workup.    #Acute on chronic hypoxic/hypercapnic respiratory failure: Etiology of hypoxia remains unclear. He reports compliance with medications and fluid restricted diet. TTE with EF 45% and moderate inferior hypokinesis in 08/2016. No signs/symptoms of infection a this time.  D-dimer mildly elevated at 0.75 and A-a gradient is 562. Differential diagnosis includes shunt, V/Q mismatch (PE, PNA,), and alveolar hypoventilation (ILD). He has a history of COPD but per significant other has never  smoked. PFTs 01/2017 with high FEV1/FVC ratio, low lung volumes, and decreased DLCO concerning for restrictive process. No wheezing on exam today and will therefore d/c prednisone. Will order CTA chest for PE evaluation as symptoms are sudden in onset. If negative, would further evaluate for restrictive disease, ?ILD.  - Continue BiPAP, wean as tolerated - IV lasix 80 mg BID - Daily weights with strict I&O - F/u TTE  - Scheduled Duonebs and Dulera with PRN albuterol - Hold home coreg 6.25 mg BID  # CKD stage 4: Patient's Cr near baseline of 2.9-3.2 per chart review.  Will continue to monitor while on diuretic therapy.  -Daily BMP  # T2DM: Currently diet controlled at home with last A1C = 6.1% on 03/12/2017. -Carb modified diet while inpatient  # HTN: Patient normotensive in ED -Continue home Imdur 30 mg daily and hydralazine TID  Fluids/electrolytes: None/replace as needed Nutrition: NPO while on BiPAP, heart healthy & carb modified VTE prophylaxis: Heparin TID Code Status: Full code    Dispo: Anticipated discharge in approximately 2-3 day(s) pending clinical improvement.   Welford Roche, MD 04/09/2017, 11:49 AM Pager: [phone]

## 2017-04-09 NOTE — ED Notes (Signed)
Tech in room for Echo scan.

## 2017-04-09 NOTE — Progress Notes (Signed)
Patient seen. Consult note pending. Patient is in CHF exacerbation and is being diuresed. Cancelled VQ scan. Stable for admit to SDU on BIPAP. We will continue follow.   Vernie Murders, MD Pulmonary & Critical Care Medicine Pager: [phone]

## 2017-04-09 NOTE — Consult Note (Signed)
PULMONARY / CRITICAL CARE MEDICINE   Name: Adam Dennis MRN: [id number] DOB: 04/07/1961    ADMISSION DATE:  04/08/2017 CONSULTATION DATE:  04/09/17  REFERRING MD:  Dr. Lynnae January / IMTS   CHIEF COMPLAINT:  SOB  HISTORY OF PRESENT ILLNESS:   56 y/o M who presented to [facility] on 11/13 with reports of 3-4 days of increasing shortness of breath.   The patient reports he monitors his daily weight, blood pressure and blood sugar with reporting to his insurance company.  He has felt so poorly over the last 3-4 days that he has been unable to get downstairs to weigh himself.  He denies infectious symptoms to include fever, body aches, chills and cough/sputum production.  He denies known sick contacts.  At baseline he wears 3-4 L oxygen per nasal cannula 24 hours a day.  He continues to work and does not wear his oxygen during work hours.  As of late he reports he has been compliant with CPAP but does not wear during daytime napping.  He has noted increased swelling in his lower extremities.  Patient was placed on BiPAP for shortness of breath in the ER.  He was admitted by internal medicine teaching service for further evaluation.  Initial ABG 7.299 / 79.8 / 52 /39.5.  Chest x-ray demonstrated cardiomegaly with CHF.  No acute infiltrate identified.  Follow-up lab work on 11/14-NA 139, K4.4, CL 98, CO2 31, BUN 35, creatinine 3.22, glucose 110, mag 2.4, WBC 7.5 and hemoglobin 12.  Intermittent breaks from BiPAP were attempted however the patient did not tolerate from an oxygenation standpoint.  PCCM consulted for hypoxemia / shortness of breath.    PAST MEDICAL HISTORY :  He  has a past medical history of Arthritis, CHF (congestive heart failure) ([place]), Chronic combined systolic and diastolic heart failure ([place]), CKD (chronic kidney disease) stage 4, GFR 15-29 ml/min ([facility]), High cholesterol, Hypertension, Hypertrophy of tonsils alone, Myocardial infarction ([place]), Obesity, unspecified, On home oxygen  therapy, OSA on CPAP, and Type II diabetes mellitus ([place]).  PAST SURGICAL HISTORY: He  has a past surgical history that includes Umbilical hernia repair and Hernia repair.  No Known Allergies  No current facility-administered medications on file prior to encounter.    Current Outpatient Medications on File Prior to Encounter  Medication Sig  . albuterol (PROVENTIL HFA;VENTOLIN HFA) 108 (90 Base) MCG/ACT inhaler Inhale 2 puffs into the lungs every 6 (six) hours as needed for wheezing or shortness of breath.  . allopurinol (ZYLOPRIM) 100 MG tablet Take 1 tablet (100 mg total) by mouth daily.  Marland Kitchen amLODipine (NORVASC) 10 MG tablet Take 10 mg daily by mouth.  Marland Kitchen aspirin 325 MG tablet Take 325 mg by mouth daily.  Marland Kitchen atorvastatin (LIPITOR) 40 MG tablet Take 1 tablet (40 mg total) by mouth daily.  . Blood Glucose Monitoring Suppl (ACCU-CHEK AVIVA) device Use as instructed daily.  . budesonide-formoterol (SYMBICORT) 160-4.5 MCG/ACT inhaler Inhale 2 puffs into the lungs 2 (two) times daily.  . calcium carbonate (TUMS - DOSED IN MG ELEMENTAL CALCIUM) 500 MG chewable tablet Chew 1 tablet (200 mg of elemental calcium total) by mouth 2 (two) times daily.  . carvedilol (COREG) 6.25 MG tablet Take 6.25 mg by mouth 2 (two) times daily.  . furosemide (LASIX) 40 MG tablet Take 1 tablet (40 mg total) by mouth 2 (two) times daily.  Marland Kitchen glucose blood (ACCU-CHEK AVIVA) test strip Use as instructed daily  . hydrALAZINE (APRESOLINE) 50 MG tablet Take 0.5 tablets (  25 mg total) by mouth 3 (three) times daily.  . isosorbide mononitrate (IMDUR) 30 MG 24 hr tablet Take 1 tablet (30 mg total) by mouth daily.  Elmore Guise Devices (ACCU-CHEK SOFTCLIX) lancets Use as instructed daily.  Marland Kitchen lisinopril (PRINIVIL,ZESTRIL) 20 MG tablet Take 20 mg by mouth daily.  . nitroGLYCERIN (NITROSTAT) 0.4 MG SL tablet Place 1 tablet (0.4 mg total) under the tongue every 5 (five) minutes x 3 doses as needed for chest pain.  . NON FORMULARY 1 each  by Other route at bedtime. CPAP MACHINE  . OXYGEN Inhale 4 L into the lungs daily.  . TRAVATAN Z 0.004 % SOLN ophthalmic solution Place 1 drop into both eyes at bedtime.    FAMILY HISTORY:  His indicated that his mother is deceased. He indicated that his father is alive. He indicated that the status of his sister is unknown. He indicated that only one of his two brothers is alive. He indicated that the status of his maternal aunt is unknown.   SOCIAL HISTORY: He  reports that  has never smoked. he has never used smokeless tobacco. He reports that he does not drink alcohol or use drugs.  REVIEW OF SYSTEMS:  POSITIVES IN BOLD Gen: Denies fever, chills, weight change, fatigue, night sweats HEENT: Denies blurred vision, double vision, hearing loss, tinnitus, sinus congestion, rhinorrhea, sore throat, neck stiffness, dysphagia PULM: Denies shortness of breath, non-productive cough, sputum production, hemoptysis, wheezing CV: Denies chest pain, edema, orthopnea, paroxysmal nocturnal dyspnea, palpitations. Lower extremity swelling GI: Denies abdominal pain, nausea, vomiting, diarrhea, hematochezia, melena, constipation, change in bowel habits GU: Denies dysuria, hematuria, polyuria, oliguria, urethral discharge Endocrine: Denies hot or cold intolerance, polyuria, polyphagia or appetite change Derm: Denies rash, dry skin, scaling or peeling skin change Heme: Denies easy bruising, bleeding, bleeding gums Neuro: Denies headache, numbness, weakness, slurred speech, loss of memory or consciousness   SUBJECTIVE: Pt reports feeling some better but remains SOB with exertion.   VITAL SIGNS: BP 127/75 (BP Location: Left Wrist)   Pulse (!) 112   Temp 97.8 F (36.6 C) (Oral)   Resp (!) 28   SpO2 91%   HEMODYNAMICS:    VENTILATOR SETTINGS: FiO2 (%):  [60 %-100 %] 80 %  INTAKE / OUTPUT: I/O last 3 completed shifts: In: -  Out: 750 [Urine:750]  PHYSICAL EXAMINATION: General: obese adult male  in NAD on BiPAP  HEENT: MM pink/moist, no jvd PSY: calm/appropriate  Neuro: AAOx4, speech clear, MAE  CV: s1s2 rrr, no m/r/g PULM: even/non-labored, lungs bilaterally diminished  GN:FAOZ, non-tender, bsx4 active  Extremities: warm/dry, 2+ BLE edema  Skin: no rashes or lesions  LABS:  BMET Recent Labs  Lab 04/08/17 1921 04/09/17 0347  NA 138 139  K 4.7 4.4  CL 98* 98*  CO2 31 31  BUN 34* 35*  CREATININE 3.21* 3.22*  GLUCOSE 101* 110*    Electrolytes Recent Labs  Lab 04/08/17 1921 04/09/17 0347  CALCIUM 8.5* 8.6*  MG  --  2.4    CBC Recent Labs  Lab 04/08/17 1945 04/09/17 0347  WBC 6.9 7.5  HGB 11.6* 12.0*  HCT 40.2 41.3  PLT 196 227    Coag's No results for input(s): APTT, INR in the last 168 hours.  Sepsis Markers No results for input(s): LATICACIDVEN, PROCALCITON, O2SATVEN in the last 168 hours.  ABG Recent Labs  Lab 04/08/17 1835  PHART 7.299*  PCO2ART 79.8*  PO2ART 52.0*    Liver Enzymes Recent Labs  Lab 04/08/17  1921  AST 23  ALT 15*  ALKPHOS 46  BILITOT 1.3*  ALBUMIN 3.2*    Cardiac Enzymes No results for input(s): TROPONINI, PROBNP in the last 168 hours.  Glucose Recent Labs  Lab 04/09/17 0726 04/09/17 1218  GLUCAP 122* 118*    Imaging Dg Chest Port 1 View  Result Date: 04/08/2017 CLINICAL DATA:  56 year old male with shortness of breath. History of CHF and hypertension. EXAM: PORTABLE CHEST 1 VIEW COMPARISON:  Chest radiograph dated 02/17/2017 FINDINGS: There is cardiomegaly with vascular congestion. Overall worsened degree of congestion compared to the prior radiograph. Areas of hazy opacity at the lung bases may be combination of atelectasis and congestion, although pneumonia is not excluded. Clinical correlation is recommended. There is no large pleural effusion. No pneumothorax. No acute osseous pathology. IMPRESSION: Cardiomegaly with findings of CHF, worsened compared to prior radiograph. Pneumonia is not excluded.  Clinical correlation is recommended. Electronically Signed   By: Anner Crete M.D.   On: 04/08/2017 18:22     STUDIES:  CXR 11/14 >> cardiomegaly with pulmonary edema   CULTURES:   ANTIBIOTICS:   SIGNIFICANT EVENTS: 11/13 Admitted with dyspnea in the setting of CHF exacerbation  LINES/TUBES:   DISCUSSION: 56 year old male with OSA on CPAP, CKD 4 and chronic combined heart failure admitted 11/14 with dyspnea in the setting of decompensated CHF.  Chest x-ray & exam with evidence of volume overload.  ASSESSMENT / PLAN:  PULMONARY A: Acute on Chronic Hypercarbic Respiratory Failure - in setting of decompensated CHF, OSA OSA on CPAP  P:   BiPAP support > he likely will be slow to turn around  ABG reviewed with improvement  Intermittent CXR Diuresis as renal function / BP permit  Will need outpatient sleep follow up at discharge  Hold on V/Q scan at this time Pulmonary hygiene > upright positioning (his Vt doubled when sat up in the bed), IS once off bipap NPO while on BiPAP Discontinue prednisone  Consider outpatient PFT's > never smoker, doubt COPD.  More likely a restrictive process given morbid obesity, reduced DLCO with CHF.   CARDIOVASCULAR A:  Chronic Combined CHF with Acute Decompensation - LVEF 39-76%, grade 1 diastolic dysfunction 11/3417 HTN HLD P:  SDU admit Monitor  Fluid restriction 1500 - 2000 ml/day  May need to revisit lisinopril given CKD (difficult as he has CHF) Defer BP regimen to primary SVC   RENAL A:   CKD 4  P:   Lasix, metalozone  Strict I/O's Place condom cath  Trend BMP / urinary output Replace electrolytes as indicated Avoid nephrotoxic agents as able, ensure adequate renal perfusion Assess protein / creatinine ratio Consider holding allopurinol > given renal insuffiencey   FAMILY  - Updates: Family (sister and girlfriend) updated at bedside.     Noe Gens, NP-C [facility] Pgr: [phone] or if no  answer [phone] 04/09/2017, 3:29 PM

## 2017-04-09 NOTE — ED Notes (Signed)
Pt stood to urinate with no drop in stats.

## 2017-04-10 ENCOUNTER — Other Ambulatory Visit: Payer: Self-pay

## 2017-04-10 ENCOUNTER — Encounter (HOSPITAL_COMMUNITY): Payer: Self-pay | Admitting: General Practice

## 2017-04-10 DIAGNOSIS — G4733 Obstructive sleep apnea (adult) (pediatric): Secondary | ICD-10-CM

## 2017-04-10 DIAGNOSIS — J810 Acute pulmonary edema: Secondary | ICD-10-CM

## 2017-04-10 DIAGNOSIS — R0902 Hypoxemia: Secondary | ICD-10-CM

## 2017-04-10 LAB — CBC WITH DIFFERENTIAL/PLATELET
BASOS ABS: 0 10*3/uL (ref 0.0–0.1)
BASOS PCT: 0 %
Eosinophils Absolute: 0 10*3/uL (ref 0.0–0.7)
Eosinophils Relative: 0 %
HEMATOCRIT: 38.2 % — AB (ref 39.0–52.0)
HEMOGLOBIN: 11 g/dL — AB (ref 13.0–17.0)
LYMPHS PCT: 13 %
Lymphs Abs: 1 10*3/uL (ref 0.7–4.0)
MCH: 24.9 pg — ABNORMAL LOW (ref 26.0–34.0)
MCHC: 28.8 g/dL — AB (ref 30.0–36.0)
MCV: 86.6 fL (ref 78.0–100.0)
MONOS PCT: 15 %
Monocytes Absolute: 1.2 10*3/uL — ABNORMAL HIGH (ref 0.1–1.0)
NEUTROS ABS: 5.7 10*3/uL (ref 1.7–7.7)
NEUTROS PCT: 72 %
Platelets: 219 10*3/uL (ref 150–400)
RBC: 4.41 MIL/uL (ref 4.22–5.81)
RDW: 20.7 % — ABNORMAL HIGH (ref 11.5–15.5)
WBC: 8 10*3/uL (ref 4.0–10.5)

## 2017-04-10 LAB — GLUCOSE, CAPILLARY
GLUCOSE-CAPILLARY: 89 mg/dL (ref 65–99)
Glucose-Capillary: 86 mg/dL (ref 65–99)
Glucose-Capillary: 79 mg/dL (ref 65–99)
Glucose-Capillary: 104 mg/dL — ABNORMAL HIGH (ref 65–99)
Glucose-Capillary: 117 mg/dL — ABNORMAL HIGH (ref 65–99)

## 2017-04-10 LAB — MAGNESIUM: MAGNESIUM: 2.3 mg/dL (ref 1.7–2.4)

## 2017-04-10 LAB — BASIC METABOLIC PANEL
Anion gap: 9 (ref 5–15)
BUN: 42 mg/dL — AB (ref 6–20)
CHLORIDE: 97 mmol/L — AB (ref 101–111)
CO2: 39 mmol/L — AB (ref 22–32)
Calcium: 8.3 mg/dL — ABNORMAL LOW (ref 8.9–10.3)
Creatinine, Ser: 3.36 mg/dL — ABNORMAL HIGH (ref 0.61–1.24)
GFR calc Af Amer: 22 mL/min — ABNORMAL LOW (ref >60)
GFR calc non Af Amer: 19 mL/min — ABNORMAL LOW (ref >60)
Glucose, Bld: 89 mg/dL (ref 65–99)
POTASSIUM: 3.8 mmol/L (ref 3.5–5.1)
SODIUM: 145 mmol/L (ref 135–145)

## 2017-04-10 LAB — PROTEIN / CREATININE RATIO, URINE
CREATININE, URINE: 67.52 mg/dL
Protein Creatinine Ratio: 0.61 mg/mg{Cre} — ABNORMAL HIGH (ref 0.00–0.15)
Total Protein, Urine: 41 mg/dL

## 2017-04-10 LAB — MRSA PCR SCREENING: MRSA by PCR: NEGATIVE

## 2017-04-10 LAB — PHOSPHORUS: Phosphorus: 3.6 mg/dL (ref 2.5–4.6)

## 2017-04-10 MED ORDER — ORAL CARE MOUTH RINSE
15.0000 mL | Freq: Two times a day (BID) | OROMUCOSAL | Status: DC
  Administered 2017-04-10 – 2017-04-16 (×7): 15 mL via OROMUCOSAL

## 2017-04-10 MED ORDER — CHLORHEXIDINE GLUCONATE 0.12 % MT SOLN
15.0000 mL | Freq: Two times a day (BID) | OROMUCOSAL | Status: DC
  Administered 2017-04-10 – 2017-04-16 (×13): 15 mL via OROMUCOSAL
  Filled 2017-04-10 (×12): qty 15

## 2017-04-10 NOTE — Progress Notes (Signed)
PULMONARY / CRITICAL CARE MEDICINE   Name: MINOR IDEN MRN: [id number] DOB: 1961-03-08    ADMISSION DATE:  04/08/2017 CONSULTATION DATE:  04/09/17  REFERRING MD:  Dr. Lynnae January / IMTS   CHIEF COMPLAINT:  SOB  HISTORY OF PRESENT ILLNESS:   56 y/o M who presented to [facility] on 11/13 with reports of 3-4 days of increasing shortness of breath.   The patient reports he monitors his daily weight, blood pressure and blood sugar with reporting to his insurance company.  He has felt so poorly over the last 3-4 days that he has been unable to get downstairs to weigh himself.  He denies infectious symptoms to include fever, body aches, chills and cough/sputum production.  He denies known sick contacts.  At baseline he wears 3-4 L oxygen per nasal cannula 24 hours a day.  He continues to work and does not wear his oxygen during work hours.  As of late he reports he has been compliant with CPAP but does not wear during daytime napping.  He has noted increased swelling in his lower extremities.  Patient was placed on BiPAP for shortness of breath in the ER.  He was admitted by internal medicine teaching service for further evaluation.  Initial ABG 7.299 / 79.8 / 52 /39.5.  Chest x-ray demonstrated cardiomegaly with CHF.  No acute infiltrate identified.  Follow-up lab work on 11/14-NA 139, K4.4, CL 98, CO2 31, BUN 35, creatinine 3.22, glucose 110, mag 2.4, WBC 7.5 and hemoglobin 12.  Intermittent breaks from BiPAP were attempted however the patient did not tolerate from an oxygenation standpoint.  PCCM consulted for hypoxemia / shortness of breath.   SUBJECTIVE:  Feels a little better, lying in bed with BiPAP mask in place. VITAL SIGNS: BP 114/81   Pulse 66   Temp 97.7 F (36.5 C) (Axillary)   Resp 15   Ht 6\' 3"  (1.905 m)   Wt (!) 333 lb 1.6 oz (151.1 kg)   SpO2 94%   BMI 41.63 kg/m   HEMODYNAMICS:    VENTILATOR SETTINGS: FiO2 (%):  [60 %-80 %] 70 %  INTAKE / OUTPUT:  Intake/Output Summary  (Last 24 hours) at 04/10/2017 1048 Last data filed at 04/10/2017 0959 Gross per 24 hour  Intake -  Output 4475 ml  Net -4475 ml     PHYSICAL EXAMINATION: General: This is a morbidly obese male, lying essentially flat in bed with BiPAP in place no acute distress HEENT: Normocephalic atraumatic BiPAP mask in place positive air leak from the mask no jugular venous distention Pulmonary: No accessory muscle use, some coarse breath sounds, no wheezing Cardiac: Distant regular rate and rhythm without murmur rub or gallop Abdomen: Soft nontender positive bowel sounds no organomegaly Extremities/musculoskeletal: Good strength, equal bulk, brisk cap refill.  Has chronic lower extremity edema and dry scaly skin Neuro/psych: Awake oriented x3 moves all extremities  LABS:  BMET Recent Labs  Lab 04/08/17 1921 04/09/17 0347 04/10/17 0759  NA 138 139 145  K 4.7 4.4 3.8  CL 98* 98* 97*  CO2 31 31 39*  BUN 34* 35* 42*  CREATININE 3.21* 3.22* 3.36*  GLUCOSE 101* 110* 89    Electrolytes Recent Labs  Lab 04/08/17 1921 04/09/17 0347 04/10/17 0759  CALCIUM 8.5* 8.6* 8.3*  MG  --  2.4 2.3  PHOS  --   --  3.6    CBC Recent Labs  Lab 04/08/17 1945 04/09/17 0347 04/10/17 0759  WBC 6.9 7.5 8.0  HGB 11.6*  12.0* 11.0*  HCT 40.2 41.3 38.2*  PLT 196 227 219    Coag's No results for input(s): APTT, INR in the last 168 hours.  Sepsis Markers No results for input(s): LATICACIDVEN, PROCALCITON, O2SATVEN in the last 168 hours.  ABG Recent Labs  Lab 04/08/17 1835 04/09/17 1609  PHART 7.299* 7.329*  PCO2ART 79.8* 74.6*  PO2ART 52.0* 81.0*    Liver Enzymes Recent Labs  Lab 04/08/17 1921  AST 23  ALT 15*  ALKPHOS 46  BILITOT 1.3*  ALBUMIN 3.2*    Cardiac Enzymes No results for input(s): TROPONINI, PROBNP in the last 168 hours.  Glucose Recent Labs  Lab 04/09/17 0726 04/09/17 1218 04/09/17 1731 04/09/17 1930 04/10/17 0030 04/10/17 0733  GLUCAP 122* 118* 100* 92  89 86    Imaging No results found.   STUDIES:  CXR 11/14 >> cardiomegaly with pulmonary edema   CULTURES:   ANTIBIOTICS:   SIGNIFICANT EVENTS: 11/13 Admitted with dyspnea in the setting of CHF exacerbation  LINES/TUBES:   DISCUSSION: 56 year old male with OSA on CPAP, CKD 4 and chronic combined heart failure admitted 11/14 with dyspnea in the setting of decompensated CHF.  Chest x-ray & exam with evidence of volume overload.  ASSESSMENT / PLAN:   Acute on Chronic Hypercarbic Respiratory Failure - in setting of decompensated CHF, OSA OSA on CPAP  -down 5.2 liters since admit Plan Cont BIPAP at HS and PRN Cont diuresis as BP/BUN/cr allow  pulm hygiene and mobilization when not on BIPAP; we need to get him out of bed Consider out-pt PFTs  Wean Oxygen    Chronic Combined CHF with Acute Decompensation - LVEF 09-73%, grade 1 diastolic dysfunction 09/3297 HTN HLD Plan Cont tele  Cont fluid restrict BP control   CKD 4  PLan Cont lasix and metolazone Avoid nephortoxinsLasix, metalozone  Cont I&O  Erick Colace ACNP-BC [place] Pager # [phone] OR # [phone] if no answer  Attending Note:  56 year old male with PMH of CHF and OSA on CPAP who presents with fluid overload.  On exam, bibasilar crackles remain.  I reviewed CXR myself, pulmonary edema noted.  Discussed with PCCM-NP.  Acute on chronica hypercarbic respiratory failure:  - BiPAP QHS and PRN  - Diureses  - Will require PFTs as outpatient  Hypoxemia:  - Titrate O2 for sat of 88-92%.  - May need an ambulatory desat study prior to discharge for home O2  Acute pulmonary edema:  - Diureses as abelow  - Fluid restriction  - BP control  CKD4:  - lasix as ordered  - Metolazone as ordered.  OSA:  - BiPAP at QHS and while asleep during the day  PCCM will continue to follow.  Patient seen and examined, agree with above note.  I dictated the care and orders written for this  patient under my direction.  Rush Farmer, [place]

## 2017-04-10 NOTE — Progress Notes (Signed)
   Subjective:  No acute events overnight. Patient states his breathing is better today. On BiPAP when seen this AM, but now on high flow nasal cannula and oxygenating well. Discuss need of TEE. Patient in agreement with plan.   Objective:  Vital signs in last 24 hours: Vitals:   04/10/17 1000 04/10/17 1006 04/10/17 1011 04/10/17 1114  BP: (!) 94/52 (!) 95/55 114/81 124/75  Pulse: 66   64  Resp: 15   15  Temp:    98.1 F (36.7 C)  TempSrc:    Axillary  SpO2: 94%   94%  Weight:      Height:       General: very pleasant male, obese, well-developed,in no acute distress when seen. On BiPAP when seen and appeared comfortable with no respiratory muscle use.  Cardiac:regular rate and rhythm, nl S1/S2, no murmurs, rubs or gallops  Pulm: decreased breath sounds at the bases, no wheezes or crackles, no increased work of breathing while on BiPAP Abd: soft, NTND, hypoactive bowel sounds  Neuro: A&Ox3, able to move all 4 extremities with no focal deficits noted  Ext: warm and well perfused, 1+ pitting edema bilaterally improving    Assessment/Plan:  Ward Adam Dennis is a 56 yo with a PMH of combined systolic and diastolic heart failure (LVEF 45-50%, G1DD on echo 08/2016), HTN, T2DM, HLD,CKD stage 4,COPD, OSA on CPAP,and chronic respiratory failure on 4L home oxygen who presents with a 2 day history of SOBwith signs and symptoms of volume overload on initial exam and workup.    # Acute on chronichypoxic/hypercapnicrespiratory failure:Able to wean off of BiPAP today. Currently on 12L high flow nasal cannula with good oxygenation. Responding well to diuresis with net UOP -3.1L. LE edema improving. Etiology of hypoxia remains unclear.  Unfortunately, TTE nondiagnostic due to body habitus. Given his elevated A-a gradient will consult cardiology for TEE to evaluate for intracardiac shunt. Per PCCM, this is likely secondary to heart failure and V/Q scan cancelled. Will continue to diurese as below and  follow up with TEE. He will need outpatient cardiology follow-up for ischemic evaluation as etiology of heart failure has not been evaluated. Cardiology aware and will schedule follow-up.  - On 12L HFNC, wean as able  - IV lasix 80 mg BID - Daily weights with strict I&O - F/u TEE  - Scheduled Duonebs and Dulera with PRN albuterol - Hold home coreg 6.25 mg BID  # CKD stage 4:Patient's Cr near baseline of 2.9-3.2 per chart review.  Will continue to monitor while on diuretic therapy.  -Daily BMP  # T2DM:Currently diet controlled at home with last A1C = 6.1% on 03/12/2017. - CBG monitoring   # KKX:[id number]  -Continue home Imdur 30 mg daily and hydralazine TID  Fluids/electrolytes:None/replace as needed Nutrition:HH + CM   VTE prophylaxis:SQ Heparin  Code Status:Full code    Dispo: Anticipated discharge in approximately 2-3 day(s) pending improvement in volume and respiratory status.   Welford Roche, MD 04/10/2017, 11:56 AM Pager: [phone]

## 2017-04-10 NOTE — Plan of Care (Signed)
Pt stable on HFNC, using BiPAP at HS. Pt diuresing well, edema in BLE is decreasing.

## 2017-04-10 NOTE — Progress Notes (Signed)
  Date: 04/10/2017  Patient name: Adam Dennis  Medical record number: [id number]  Date of birth: [birth year]   I have seen and evaluated this patient and I have discussed the plan of care with the house staff. Please see their note for complete details. I concur with their findings with the following additions/corrections: still requiring O2 and bipap. Does get to high 80's on Bipap but increases when interacting with team. Cr up to 3.22. Net negative (was not recorded in ED) since on floor -4 L and weight down 10 lbs? Agree with TEE and ischemic W/U likely outpt.   Bartholomew Crews, MD 04/10/2017, 2:03 PM

## 2017-04-11 ENCOUNTER — Telehealth: Payer: Self-pay

## 2017-04-11 ENCOUNTER — Telehealth: Payer: Self-pay | Admitting: Family Medicine

## 2017-04-11 DIAGNOSIS — E662 Morbid (severe) obesity with alveolar hypoventilation: Secondary | ICD-10-CM

## 2017-04-11 DIAGNOSIS — I5023 Acute on chronic systolic (congestive) heart failure: Secondary | ICD-10-CM

## 2017-04-11 DIAGNOSIS — J810 Acute pulmonary edema: Secondary | ICD-10-CM

## 2017-04-11 LAB — BASIC METABOLIC PANEL
ANION GAP: 10 (ref 5–15)
BUN: 49 mg/dL — ABNORMAL HIGH (ref 6–20)
CALCIUM: 8.3 mg/dL — AB (ref 8.9–10.3)
CO2: 38 mmol/L — ABNORMAL HIGH (ref 22–32)
CREATININE: 3.58 mg/dL — AB (ref 0.61–1.24)
Chloride: 97 mmol/L — ABNORMAL LOW (ref 101–111)
GFR, EST AFRICAN AMERICAN: 21 mL/min — AB (ref >60)
GFR, EST NON AFRICAN AMERICAN: 18 mL/min — AB (ref >60)
GLUCOSE: 89 mg/dL (ref 65–99)
Potassium: 3.7 mmol/L (ref 3.5–5.1)
Sodium: 145 mmol/L (ref 135–145)

## 2017-04-11 LAB — GLUCOSE, CAPILLARY
Glucose-Capillary: 81 mg/dL (ref 65–99)
Glucose-Capillary: 133 mg/dL — ABNORMAL HIGH (ref 65–99)
Glucose-Capillary: 103 mg/dL — ABNORMAL HIGH (ref 65–99)
Glucose-Capillary: 99 mg/dL (ref 65–99)

## 2017-04-11 LAB — MAGNESIUM: Magnesium: 2.3 mg/dL (ref 1.7–2.4)

## 2017-04-11 MED ORDER — FUROSEMIDE 80 MG PO TABS
80.0000 mg | ORAL_TABLET | Freq: Two times a day (BID) | ORAL | Status: DC
  Administered 2017-04-11 – 2017-04-12 (×3): 80 mg via ORAL
  Filled 2017-04-11 (×3): qty 1

## 2017-04-11 MED ORDER — CARVEDILOL 6.25 MG PO TABS
6.2500 mg | ORAL_TABLET | Freq: Two times a day (BID) | ORAL | Status: DC
  Administered 2017-04-11 – 2017-04-16 (×9): 6.25 mg via ORAL
  Filled 2017-04-11 (×11): qty 1

## 2017-04-11 NOTE — Progress Notes (Signed)
PULMONARY / CRITICAL CARE MEDICINE   Name: Adam Dennis MRN: [id number] DOB: [birth year]    ADMISSION DATE:  04/08/2017 CONSULTATION DATE:  04/09/17  REFERRING MD:  Dr. Lynnae January / IMTS   CHIEF COMPLAINT:  SOB  HISTORY OF PRESENT ILLNESS:   56 y/o M on home O2 3-4 L, admitted 7/13 with acute hypoxic/hypercarbic respiratory failure requiring BiPAP  Past medical history of OSA, chronic combined systolic and diastolic heart failure and CKD stage IV  SUBJECTIVE:  Examined around 8-30 AM and still had BiPAP on ,slept well overnight, Denies chest pain, dyspnea improving, able to speak in full sentences through BiPAP fullface mask   VITAL SIGNS: BP 105/64 (BP Location: Right Arm)   Pulse 94   Temp 99 F (37.2 C) (Axillary)   Resp (!) 25   Ht 6\' 3"  (1.905 m)   Wt (!) 324 lb 1.6 oz (147 kg)   SpO2 99%   BMI 40.51 kg/m   HEMODYNAMICS:    VENTILATOR SETTINGS: FiO2 (%):  [55 %] 55 %  INTAKE / OUTPUT:  Intake/Output Summary (Last 24 hours) at 04/11/2017 1034 Last data filed at 04/10/2017 2358 Gross per 24 hour  Intake 1182 ml  Output 2500 ml  Net -1318 ml     PHYSICAL EXAMINATION: General: morbidly obese male, with BiPAP in place no acute distress HEENT: Normocephalic atraumatic BiPAP mask,no jugular venous distention Pulmonary: No accessory muscle use, some coarse breath sounds, no wheezing Cardiac: Distant regular rate and rhythm without murmur rub or gallop Abdomen: Soft nontender positive bowel sounds no organomegaly Extremities/musculoskeletal: Good strength, equal bulk, brisk cap refill.  Has chronic lower extremity edema and dry scaly skin Neuro/psych: Awake oriented x3 moves all extremities  LABS:  BMET Recent Labs  Lab 04/09/17 0347 04/10/17 0759 04/11/17 0522  NA 139 145 145  K 4.4 3.8 3.7  CL 98* 97* 97*  CO2 31 39* 38*  BUN 35* 42* 49*  CREATININE 3.22* 3.36* 3.58*  GLUCOSE 110* 89 89    Electrolytes Recent Labs  Lab 04/09/17 0347  04/10/17 0759 04/11/17 0522  CALCIUM 8.6* 8.3* 8.3*  MG 2.4 2.3  --   PHOS  --  3.6  --     CBC Recent Labs  Lab 04/08/17 1945 04/09/17 0347 04/10/17 0759  WBC 6.9 7.5 8.0  HGB 11.6* 12.0* 11.0*  HCT 40.2 41.3 38.2*  PLT 196 227 219    Coag's No results for input(s): APTT, INR in the last 168 hours.  Sepsis Markers No results for input(s): LATICACIDVEN, PROCALCITON, O2SATVEN in the last 168 hours.  ABG Recent Labs  Lab 04/08/17 1835 04/09/17 1609  PHART 7.299* 7.329*  PCO2ART 79.8* 74.6*  PO2ART 52.0* 81.0*    Liver Enzymes Recent Labs  Lab 04/08/17 1921  AST 23  ALT 15*  ALKPHOS 46  BILITOT 1.3*  ALBUMIN 3.2*    Cardiac Enzymes No results for input(s): TROPONINI, PROBNP in the last 168 hours.  Glucose Recent Labs  Lab 04/10/17 0030 04/10/17 0733 04/10/17 1112 04/10/17 1509 04/10/17 2036 04/11/17 0722  GLUCAP 89 86 79 104* 117* 81    Imaging No results found.   STUDIES:  CXR 11/14 >> cardiomegaly with pulmonary edema   CULTURES:   ANTIBIOTICS:   SIGNIFICANT EVENTS: 11/13 Admitted with dyspnea in the setting of CHF exacerbation  LINES/TUBES:   DISCUSSION: 56 year old male with OSA on CPAP, CKD 4 and chronic combined heart failure admitted 11/14 with dyspnea in the setting of decompensated CHF.  Improved significantly with diuresis and BiPAP  ASSESSMENT / PLAN:   Acute on Chronic Hypercarbic Respiratory Failure - in setting of decompensated CHF, OSA OSA on CPAP  PFTs 01/2017 reviewed-shows severe restriction with the low DLCO consistent with pulmonary edema, no evidence of ILD on CT abdomen 08/2015 reviewed  Plan Cont BIPAP at HS and PRN Mobilize out of bed daytime Wean Oxygen    Chronic Combined CHF with Acute Decompensation - LVEF 46-43%, grade 1 diastolic dysfunction 05/4274 HTN HLD Plan Cont tele  Cont fluid restrict BP control   CKD 4  PLan Cont lasix , creatinine has risen slightly from 3.2-3.5 and may have to  back off on diuretics if continues to rise  Kara Mead MD. [place] Flood. [facility] Pager [phone] If no response call [phone]   04/11/2017   Rigoberto Noel, MD

## 2017-04-11 NOTE — Care Management Note (Addendum)
Case Management Note  Patient Details  Name: Adam Dennis MRN: [id number] Date of Birth: December 17, 1960  Subjective/Objective: Pt presented for Acute on Chronic CHF- Pt is active with [facility]. Pt is followed in the community by [facility] for tele-monitoring. Pt has DME 02 at home @ 4 L continuous via [place]. Hospital Follow up appointment established with TCC @ the [facility] and [facility]. Appointment to be placed on the AVS.               Action/Plan:  CM did discuss home Care needs with the patient. Patient wants a [facility] RN once stable for d/c. MD will need to write [facility] Orders once stable. Pt states he wants to [name]. Referral made to [facility] with [facility] and SOC to begin within 24-48 hours post d/c. Pt states he is in need of a new Cardiologist as well.    Expected Discharge Date:                  Expected Discharge Plan:  Home with Home Health In-House Referral:  NA  Discharge planning Services  CM Consult, Follow-up appt scheduled, Medication Assistance, [place] Acute Care Choice:   Home Health, Durable Medical Equipment.  Choice offered to:   Patient  DME Arranged:  NIV DME Agency:   [place] Arranged:   RN/ Disease Management [place] Agency:   [facility]  Status of Service: Completed.  If discussed at [place] of Stay Meetings, dates discussed:    Additional Comments: 9935 04-16-17 NIV to be delivered to home today via [facility]. [facility] to contact patient with delivery time. MD to place orders for Home Health. No further needs from CM at this time. Jacqlyn Krauss, RN, BSN [phone]   0092 04-15-17 Jacqlyn Krauss, RN,BSN [phone] Non-Invasive Vent orders received. Orders provided to [place]. Pt for d/c home with NIV- [facility] to work on education. No further needs from CM at this time.     1415 04-14-17 Jacqlyn Krauss, RN, BSN [phone] CM did speak with MD in  regards to if pt goes home on Non- Invasive Home Vent- Medicaid will be able to pick up the bill. Pt will qualify for this by ABG's in Epic. CM is awaiting MD signature on order form. No further needs from CM at this time.    1132 04-14-17 Jacqlyn Krauss, RN,BSN [phone] CM did speak with [facility] in regards to new BIPAP order. Pt has not had a sleep study within 8 years per pt. Pt has Medicaid for Insurance- pt will need new sleep study prior to Medicaid being able to pay for BIPAP. CM has reached out to [facility] to Clarify.  Bethena Roys, RN 04/11/2017, 3:09 PM

## 2017-04-11 NOTE — Progress Notes (Signed)
   Subjective:  No acute events overnight.  Patient states he is doing well, breathing improving.  On BiPAP when seen this morning.   Objective:  Vital signs in last 24 hours: Vitals:   04/10/17 2355 04/11/17 0031 04/11/17 0355 04/11/17 0524  BP: (!) 103/51   (P) 117/75  Pulse: 73   (P) 94  Resp: (!) 26   (!) (P) 25  Temp: 98 F (36.7 C)   (P) 98 F (36.7 C)  TempSrc: Axillary   (P) Axillary  SpO2: 94% 92% 94% (P) 94%  Weight:      Height:       General: Pleasant male, obese, well-developed, lying in bed in no acute distress Cardiac: regular rate and rhythm, nl S1/S2, no murmurs, rubs or gallops  Pulm: Difficult exam due to body habitus, clear to auscultation bilaterally Abd: soft, NTND, bowel sounds present  Neuro: A&Ox3, able to move all 4 extremities with no focal deficits  Ext: warm and well perfused, trace pitting edema bilaterally   Assessment/Plan:  Adam Dennis is a 56 yo with a PMH of combined systolic and diastolic heart failure (LVEF 45-50%, G1DD on echo 08/2016), HTN, T2DM, HLD,CKD stage 4,COPD, OSA on CPAP,and chronic hypercarbic respiratory failure on 4L home oxygen who presents with acute onset of S OB and signs and symptoms of volume overload.  # Acute on chronichypoxic/hypercapnicrespiratory failure:On 12L HFNC with good oxygenation. Responding well to diuresis with net UOP -2.6L and -6.5L since admission.  Will transition to p.o. Lasix as below as he appears euvolemic on exam today. Etiology of hypoxia remains unclear, likely a combination of HF exacerbation and OHS in the setting of elevated A-A gradient. TEE on 11/19 for evaluation of intracardiac shunt. He will need outpatient cardiology follow-up for ischemic evaluation as etiology of heart failure has not been evaluated. Cardiology aware and will schedule follow-up.  - On 12L HFNC, wean as able  - BiPAP QHS  - IV lasix 80 mg BID - Daily weights with strict I&O - F/u TEE, hopefully 11/19  -  Scheduled Duonebs and Dulera with PRN albuterol -Resumehome coreg 6.25 mg BID  #CKD stage 4:Baseline Cr 2.9-3.2. Will continue to monitor while on diuretic therapy.  -Daily BMP  #T2DM:Currently diet controlled at home with last A1C = 6.1% on 03/12/2017. - CBG monitoring   #HTN:Normotensive  -Continue home Imdur 30 mg daily and hydralazine TID  Fluids/electrolytes:None/replace as needed Nutrition:HH + CM   VTE prophylaxis:SQ Heparin  Code Status:Fullcode   Dispo: Anticipated discharge in approximately 3-4 day(s) pending improvement in respiratory status and TEE.   Welford Roche, MD 04/11/2017, 5:57 AM Pager: [phone]

## 2017-04-11 NOTE — Telephone Encounter (Signed)
Patient's appointment at [facility] 04/16/17 @0945  was placed on the AVS. Jacqlyn Krauss, RN CM notified that the patient will be followed by the [facility] TCC

## 2017-04-11 NOTE — Telephone Encounter (Signed)
Met with patient while he's in the hospital. Informed him of the TCC clinic at [facility]. Patient is aware of clinic and has been part of it in the past. Patient agreed to be seen by Dr. Jarold Song on Wednesday 11/21 @ 9:45am.  During our conversation patient stated that he lives at home with his girlfriend Suanne Marker, who was present in the room.  She is his support system. She cooks and cares for him. Patient does not receive food stamps but receives disability ([employer]) but didn't say the amount. Patient works 4 hours a day and uses [employer] to get to work sometimes. Patient expressed that sometimes he has to wait a while to get picked up or dropped off. Asked patient if he has applied for Medicaid transportation and he said "No".   Patient mentioned that he has a glucometer at home. He was doing good for a while in logging the recordings but stopped. Encouraged patient to start doing it again and bring in readings on his next appointment. Patient also uses an oxygen tank. Patient also stated that he has a scale at home and continues to be tele monitored by [facility].   Patient confirmed that he uses [employer] on [address]. He also confirmed that his phone number and Wendy's phone number (his emergency contact) are correct as it appears on Epic.

## 2017-04-12 LAB — BASIC METABOLIC PANEL
ANION GAP: 8 (ref 5–15)
BUN: 42 mg/dL — ABNORMAL HIGH (ref 6–20)
CALCIUM: 8.2 mg/dL — AB (ref 8.9–10.3)
CO2: 38 mmol/L — ABNORMAL HIGH (ref 22–32)
Chloride: 96 mmol/L — ABNORMAL LOW (ref 101–111)
Creatinine, Ser: 3.02 mg/dL — ABNORMAL HIGH (ref 0.61–1.24)
GFR, EST AFRICAN AMERICAN: 25 mL/min — AB (ref >60)
GFR, EST NON AFRICAN AMERICAN: 22 mL/min — AB (ref >60)
Glucose, Bld: 88 mg/dL (ref 65–99)
Potassium: 3.6 mmol/L (ref 3.5–5.1)
Sodium: 142 mmol/L (ref 135–145)

## 2017-04-12 LAB — GLUCOSE, CAPILLARY
Glucose-Capillary: 79 mg/dL (ref 65–99)
Glucose-Capillary: 106 mg/dL — ABNORMAL HIGH (ref 65–99)
Glucose-Capillary: 120 mg/dL — ABNORMAL HIGH (ref 65–99)
Glucose-Capillary: 101 mg/dL — ABNORMAL HIGH (ref 65–99)

## 2017-04-12 LAB — MAGNESIUM: Magnesium: 2.2 mg/dL (ref 1.7–2.4)

## 2017-04-12 MED ORDER — IPRATROPIUM-ALBUTEROL 0.5-2.5 (3) MG/3ML IN SOLN
3.0000 mL | Freq: Three times a day (TID) | RESPIRATORY_TRACT | Status: DC | PRN
  Administered 2017-04-12: 3 mL via RESPIRATORY_TRACT
  Filled 2017-04-12: qty 3

## 2017-04-12 MED ORDER — POTASSIUM CHLORIDE CRYS ER 20 MEQ PO TBCR
40.0000 meq | EXTENDED_RELEASE_TABLET | Freq: Once | ORAL | Status: AC
Start: 2017-04-12 — End: 2017-04-12
  Administered 2017-04-12: 40 meq via ORAL
  Filled 2017-04-12: qty 2

## 2017-04-12 MED ORDER — BUMETANIDE 2 MG PO TABS
2.0000 mg | ORAL_TABLET | Freq: Two times a day (BID) | ORAL | Status: DC
  Administered 2017-04-12 – 2017-04-16 (×8): 2 mg via ORAL
  Filled 2017-04-12 (×10): qty 1

## 2017-04-12 NOTE — Progress Notes (Signed)
PULMONARY / CRITICAL CARE MEDICINE   Name: Adam Dennis MRN: [id number] DOB: 11/21/1960    ADMISSION DATE:  04/08/2017 CONSULTATION DATE:  04/09/17  REFERRING MD:  Dr. Lynnae January / IMTS   CHIEF COMPLAINT:  SOB  HISTORY OF PRESENT ILLNESS:   56 y/o M on home O2 3-4 L, admitted 7/13 with acute hypoxic/hypercarbic respiratory failure requiring BiPAP  Past medical history of OSA, chronic combined systolic and diastolic heart failure and CKD stage IV  SUBJECTIVE:  Much improved, sitting up speaking in full sentences. Down to 6 L nasal cannula   VITAL SIGNS: BP 113/74   Pulse 76   Temp 98 F (36.7 C) (Axillary)   Resp (!) 21   Ht 6\' 3"  (1.905 m)   Wt (!) 327 lb 3.2 oz (148.4 kg)   SpO2 92%   BMI 40.90 kg/m   HEMODYNAMICS:    VENTILATOR SETTINGS: FiO2 (%):  [55 %] 55 %  INTAKE / OUTPUT:  Intake/Output Summary (Last 24 hours) at 04/12/2017 1113 Last data filed at 04/12/2017 5852 Gross per 24 hour  Intake 720 ml  Output 1825 ml  Net -1105 ml     PHYSICAL EXAMINATION: General: morbidly obese male,  no acute distress HEENT: Normocephalic atraumatic ,no jugular venous distention Pulmonary: No accessory muscle use, bilateral decreased breath sounds, no wheezing Cardiac: Distant regular rate and rhythm without murmur rub or gallop Abdomen: Soft nontender positive bowel sounds no organomegaly Extremities/musculoskeletal: Good strength, equal bulk, brisk cap refill.  Has chronic lower extremity edema and dry scaly skin Neuro/psych: Awake oriented x3 moves all extremities  LABS:  BMET Recent Labs  Lab 04/10/17 0759 04/11/17 0522 04/12/17 0506  NA 145 145 142  K 3.8 3.7 3.6  CL 97* 97* 96*  CO2 39* 38* 38*  BUN 42* 49* 42*  CREATININE 3.36* 3.58* 3.02*  GLUCOSE 89 89 88    Electrolytes Recent Labs  Lab 04/10/17 0759 04/11/17 0522 04/12/17 0506  CALCIUM 8.3* 8.3* 8.2*  MG 2.3 2.3 2.2  PHOS 3.6  --   --     CBC Recent Labs  Lab 04/08/17 1945  04/09/17 0347 04/10/17 0759  WBC 6.9 7.5 8.0  HGB 11.6* 12.0* 11.0*  HCT 40.2 41.3 38.2*  PLT 196 227 219    Coag's No results for input(s): APTT, INR in the last 168 hours.  Sepsis Markers No results for input(s): LATICACIDVEN, PROCALCITON, O2SATVEN in the last 168 hours.  ABG Recent Labs  Lab 04/08/17 1835 04/09/17 1609  PHART 7.299* 7.329*  PCO2ART 79.8* 74.6*  PO2ART 52.0* 81.0*    Liver Enzymes Recent Labs  Lab 04/08/17 1921  AST 23  ALT 15*  ALKPHOS 46  BILITOT 1.3*  ALBUMIN 3.2*    Cardiac Enzymes No results for input(s): TROPONINI, PROBNP in the last 168 hours.  Glucose Recent Labs  Lab 04/10/17 2036 04/11/17 0722 04/11/17 1108 04/11/17 1625 04/11/17 2049 04/12/17 0726  GLUCAP 117* 81 133* 103* 99 79    Imaging No results found.   STUDIES:  CXR 11/14 >> cardiomegaly with pulmonary edema   CULTURES:   ANTIBIOTICS:   SIGNIFICANT EVENTS: 11/13 Admitted with dyspnea in the setting of CHF exacerbation  LINES/TUBES:   DISCUSSION: 56 year old male with OSA on CPAP, CKD 4 and chronic combined heart failure admitted 11/14 with dyspnea in the setting of decompensated CHF.  Improved significantly with diuresis and BiPAP  ASSESSMENT / PLAN:   Acute on Chronic Hypercarbic Respiratory Failure - in setting of decompensated  CHF, OSA OSA on CPAP  PFTs 01/2017 reviewed-shows severe restriction with the low DLCO consistent with pulmonary edema, no evidence of ILD on CT abdomen 08/2015 reviewed  Plan Cont BIPAP q HS  Mobilize Wean Oxygen -he is close to his home requirement of 4 L   Chronic Combined CHF with Acute Decompensation - LVEF 67-89%, grade 1 diastolic dysfunction 07/8099 HTN HLD Plan Cont tele    CKD 4  PLan Cont lasix , creatinine decreased back down to 3.0 and tolerating well  He has a CPAP machine and will need a prescription for BiPAP given his repeated admissions for acute on chronic respiratory failure Please send a  prescription for BiPAP 18/10 to DME on discharge He will also need follow-up appointment with pulmonary for 3-4 weeks  P CCM available as needed  Kara Mead MD. FCCP. [facility] Pager [phone] If no response call [phone]   04/12/2017   Rigoberto Noel, MD

## 2017-04-12 NOTE — Evaluation (Signed)
Physical Therapy Evaluation Patient Details Name: Adam Dennis MRN: [id number] DOB: Jun 15, 1960 Today's Date: 04/12/2017   History of Present Illness  Patient is a 56 yo male admitted 04/08/17 with Acute on Chronic Hypercarbic Respiratory Failure - in setting of decompensated CHF, OSA.  Patient requiring BiPAP at times.   PMH:  COPD, on O2 at 3-4 L, OSA on CPAP, CHF, CKD, morbid obesity, chronic LE edema, HTN, HLD, arthritis, DM, MI  Clinical Impression  Patient presents with problems listed below.   Will benefit from acute PT to maximize functional independence prior to return home.  Patient with decreased strength and DOE impacting mobility, gait, and activity tolerance.  Will continue to follow acutely in hospital.  Anticipate patient will progress well with mobility.  Will continue to assess d/c needs.    Follow Up Recommendations No PT follow up;Supervision - Intermittent    Equipment Recommendations  None recommended by PT    Recommendations for Other Services       Precautions / Restrictions Precautions Precautions: None Precaution Comments: On O2 Restrictions Weight Bearing Restrictions: No      Mobility  Bed Mobility               General bed mobility comments: Patient in recliner as PT entered room.  Transfers Overall transfer level: Needs assistance Equipment used: None Transfers: Sit to/from Stand Sit to Stand: Min guard         General transfer comment: Cues for hand placement.  Assist for safety.  Ambulation/Gait Ambulation/Gait assistance: Min guard Ambulation Distance (Feet): 30 Feet Assistive device: None Gait Pattern/deviations: Step-through pattern;Decreased stride length;Shuffle Gait velocity: decreased Gait velocity interpretation: Below normal speed for age/gender General Gait Details: Patient with good gait pattern and balance.  No assistive device needed.  Assist for safety only.  O2 sats at 93-94% during gait on O2.  HR increased to  121 during gait.  Quickly decreased to 107 once seated.  Stairs            Wheelchair Mobility    Modified Rankin (Stroke Patients Only)       Balance Overall balance assessment: No apparent balance deficits (not formally assessed)                                           Pertinent Vitals/Pain Pain Assessment: No/denies pain    Home Living Family/patient expects to be discharged to:: Private residence Living Arrangements: Spouse/significant other;Children Available Help at Discharge: Family;Available PRN/intermittently(Significant other works.) Type of Home: Apartment Home Access: Level entry     Home Layout: Two level;Bed/bath upstairs Home Equipment: None      Prior Function Level of Independence: Independent         Comments: Baseline able to ambulate household distances.  Outside of home requires assist.  Mobility declined over past 2 months.     Hand Dominance        Extremity/Trunk Assessment   Upper Extremity Assessment Upper Extremity Assessment: Overall WFL for tasks assessed    Lower Extremity Assessment Lower Extremity Assessment: Generalized weakness(chronic edema)       Communication   Communication: No difficulties  Cognition Arousal/Alertness: Awake/alert Behavior During Therapy: WFL for tasks assessed/performed Overall Cognitive Status: Within Functional Limits for tasks assessed  General Comments      Exercises     Assessment/Plan    PT Assessment Patient needs continued PT services  PT Problem List Decreased strength;Decreased activity tolerance;Decreased mobility;Cardiopulmonary status limiting activity;Obesity       PT Treatment Interventions DME instruction;Gait training;Stair training;Functional mobility training;Therapeutic activities;Therapeutic exercise;Patient/family education    PT Goals (Current goals can be found in the Care Plan  section)  Acute Rehab PT Goals Patient Stated Goal: To return home PT Goal Formulation: With patient Time For Goal Achievement: 04/19/17 Potential to Achieve Goals: Good    Frequency Min 3X/week   Barriers to discharge Decreased caregiver support Is alone during day.    Co-evaluation               AM-PAC PT "6 Clicks" Daily Activity  Outcome Measure Difficulty turning over in bed (including adjusting bedclothes, sheets and blankets)?: None Difficulty moving from lying on back to sitting on the side of the bed? : A Little Difficulty sitting down on and standing up from a chair with arms (e.g., wheelchair, bedside commode, etc,.)?: None Help needed moving to and from a bed to chair (including a wheelchair)?: None Help needed walking in hospital room?: None Help needed climbing 3-5 steps with a railing? : A Little 6 Click Score: 22    End of Session Equipment Utilized During Treatment: Gait belt;Oxygen Activity Tolerance: Patient tolerated treatment well;Patient limited by fatigue(Dyspnea 2/4 during gait.) Patient left: in chair;with call bell/phone within reach;with nursing/sitter in room Nurse Communication: Mobility status(O2 sats not reading well. RN to change probe.) PT Visit Diagnosis: Other abnormalities of gait and mobility (R26.89);Muscle weakness (generalized) (M62.81)    Time: 3818-4037 PT Time Calculation (min) (ACUTE ONLY): 12 min   Charges:   PT Evaluation $PT Eval Moderate Complexity: 1 Mod     PT G Codes:        Carita Pian. Sanjuana Kava, [facility] Acute Rehab Services Pager [phone]   Despina Pole 04/12/2017, 8:07 PM

## 2017-04-12 NOTE — Progress Notes (Addendum)
   Subjective:  No acute events overnight. Patient weaned down to nasal cannula yesterday. States he is feeling much better. No complaints this AM. Encouraged ambulation.   Objective:  Vital signs in last 24 hours: Vitals:   04/11/17 2351 04/12/17 0020 04/12/17 0408 04/12/17 0428  BP:  (!) 99/58  122/80  Pulse:  68  70  Resp:  (!) 25  14  Temp:  98.7 F (37.1 C)  99.2 F (37.3 C)  TempSrc:  Axillary  Axillary  SpO2: 95% 94% 94% 93%  Weight:    (!) 327 lb 3.2 oz (148.4 kg)  Height:       General: very pleasant male, obese, well-developed, sleeping in bed with BiPAP on in no acute distress  Cardiac: regular rate and rhythm, nl S1/S2, no murmurs, rubs or gallops  Pulm: difficult exam due to body habitus, no wheezes or crackles noted, no increased work of breathing while on BiPAP Abd: soft, NTND, bowel sounds present  Neuro: A&Ox3, able to move all 4 extremities with no focal deficits noted  Ext: warm and well perfused, no pitting edema bilaterally    Assessment/Plan:  Adam Dennis is a 56 yo with a history of combined systolic and diastolic heart failure (LVEF 45-50%, G1DD 08/2016), HTN, diet-controlled T2DM, HLD,CKD stage 4,COPD, OSA on CPAP,and chronic hypercarbic respiratory failure on 4L home oxygen who presents with acute onset of SOB and signs and symptoms of volume overload.  # Acute on chronic hypoxic/hypercapnic respiratory failure: Likely multifactorial (HF, OSA, and OHS).  Weaned to 6L of supplemental oxygen yesterday with oxygen saturations 88-99%.  Will transition to CPAP at night given improvement in respiratory status.  Currently on home diuretic regimen with net UOP -880cc. Euvolemic on exam, but weight increased by 3 lbs.  Renal function remains stable.  Will switch to Bumex 2 mg BID. Encourage patient to ambulate now that his respiratory status improved.  PT consult placed as well.  Awaiting for TEE on 11/19.  -ON 6L [place], wean to home O2 as able  - BiPAP QHS -->  CPAP QHS  - D/c PO Lasix 80 mg BID and switch to Bumex 2 mg BID  - Daily weights with strict I&O - F/u TEE, hopefully 11/19  - Continue home Dulera and Coreg 6.25 mg BID - Duonebs q8h PRN  - PT consult   #CKD stage 4:Baseline Cr 2.9-3.2. Will continue to monitor while on diuretic therapy.  -Daily BMP  #T2DM:Currently diet controlled at home with last A1C = 6.1% on 03/12/2017. -CBG monitoring  #HTN:Normotensive -Continue home Imdur 30 mg daily and hydralazine TID  F: none  E:replace as needed N:HH + CMdiet   VTE prophylaxis:SQHeparin    Code Status:Fullcode  Dispo: Anticipated discharge in approximately 2-3 day(s) pending improvement in respiratory status and TEE.   Welford Roche, MD 04/12/2017, 6:35 AM Pager: [phone]

## 2017-04-13 LAB — GLUCOSE, CAPILLARY
Glucose-Capillary: 85 mg/dL (ref 65–99)
Glucose-Capillary: 98 mg/dL (ref 65–99)
Glucose-Capillary: 84 mg/dL (ref 65–99)
Glucose-Capillary: 136 mg/dL — ABNORMAL HIGH (ref 65–99)

## 2017-04-13 LAB — BASIC METABOLIC PANEL
Anion gap: 6 (ref 5–15)
BUN: 39 mg/dL — AB (ref 6–20)
CALCIUM: 8.3 mg/dL — AB (ref 8.9–10.3)
CO2: 40 mmol/L — ABNORMAL HIGH (ref 22–32)
CREATININE: 2.77 mg/dL — AB (ref 0.61–1.24)
Chloride: 95 mmol/L — ABNORMAL LOW (ref 101–111)
GFR calc Af Amer: 28 mL/min — ABNORMAL LOW (ref >60)
GFR, EST NON AFRICAN AMERICAN: 24 mL/min — AB (ref >60)
GLUCOSE: 89 mg/dL (ref 65–99)
POTASSIUM: 4.2 mmol/L (ref 3.5–5.1)
SODIUM: 141 mmol/L (ref 135–145)

## 2017-04-13 LAB — MAGNESIUM: Magnesium: 2.2 mg/dL (ref 1.7–2.4)

## 2017-04-13 NOTE — Progress Notes (Signed)
   Subjective:  No acute events overnight. On 4-5L O2 [place] during the day. Wearing BiPAP when seen as he was sleeping. Reports feeling well. No complaints this AM.    Objective:  Vital signs in last 24 hours: Vitals:   04/13/17 0030 04/13/17 0451 04/13/17 0900 04/13/17 0945  BP: (!) 142/83 (!) 144/91 (!) 98/52   Pulse: 65 61    Resp:      Temp:  97.8 F (36.6 C) (!) 97.3 F (36.3 C)   TempSrc:  Oral Oral   SpO2: 100% 100%  91%  Weight:  (!) 330 lb 9.6 oz (150 kg)    Height:       General: very pleasant male, obese, well-developed, sleeping in bed with BiPAP on in no acute distress  Cardiac: regular rate and rhythm, nl S1/S2, no murmurs, rubs or gallops  Pulm: difficult exam due to body habitus, no wheezes or crackles noted, no increased work of breathing while on BiPAP Abd: soft, NTND, bowel sounds present  Neuro: A&Ox3, able to move all 4 extremities with no focal deficits noted  Ext: warm and well perfused, no pitting edema noted bilaterally    Assessment/Plan:  Blanchard Willhite is a 56 yo with a history of combined systolic and diastolic heart failure (LVEF 45-50%, G1DD 08/2016), HTN, diet-controlled T2DM, HLD,CKD stage 4,COPD, OSA on CPAP,and chronic hypercarbic respiratory failure on 4L home oxygen who presents with acute onset of SOB and signs and symptoms of volume overload.  # Acute on chronic hypoxic/hypercapnic respiratory failure: Likely multifactorial (HF, OSA, and OHS).  Weaned to home oxygen with good oxygenation. Will continue BiPAP QHS per PCCM recs. Three pound weight gain, but only received Bumex in PM yesterday. UOP 2L and net -1L. Remains euvolemic on exam. Will do Bumex bid today and reassess tomorrow. Awaiting for TEE on 11/19.  -On 4-5L O2 [place] (home oxygen) - BiPAP QHS per PCCM recommendations. Will prescribe on discharge. - Continue to Bumex 2 mg BID  - Daily weights with strict I&O - F/u TEE, hopefully 11/19, will confirm date with cardiology tomorrow AM  -  Continue home Dulera and Coreg 6.25 mg BID - Duonebs q8h PRN  - Continue PT   #CKD stage 4:Baseline Cr 2.9-3.2. Will continue to monitor while on diuretic therapy.  -Daily BMP   #T2DM:Currently diet controlled at home with last A1C = 6.1% on 03/12/2017. -CBG monitoring  #HTN:Normotensive -Continue home Imdur 30 mg daily and hydralazine TID  F: none  E:replete as needed N:HH + CMdiet   VTE prophylaxis:SQHeparin    Code Status:Fullcode  Dispo: Anticipated discharge in approximately 2-3 day(s) pending TEE.   Welford Roche, MD 04/13/2017, 10:46 AM Pager: [phone]

## 2017-04-14 LAB — GLUCOSE, CAPILLARY
GLUCOSE-CAPILLARY: 80 mg/dL (ref 65–99)
GLUCOSE-CAPILLARY: 137 mg/dL — AB (ref 65–99)
GLUCOSE-CAPILLARY: 85 mg/dL (ref 65–99)
Glucose-Capillary: 125 mg/dL — ABNORMAL HIGH (ref 65–99)

## 2017-04-14 LAB — BASIC METABOLIC PANEL
ANION GAP: 6 (ref 5–15)
BUN: 34 mg/dL — AB (ref 6–20)
CHLORIDE: 94 mmol/L — AB (ref 101–111)
CO2: 38 mmol/L — ABNORMAL HIGH (ref 22–32)
Calcium: 8.1 mg/dL — ABNORMAL LOW (ref 8.9–10.3)
Creatinine, Ser: 2.58 mg/dL — ABNORMAL HIGH (ref 0.61–1.24)
GFR calc Af Amer: 31 mL/min — ABNORMAL LOW (ref >60)
GFR calc non Af Amer: 26 mL/min — ABNORMAL LOW (ref >60)
Glucose, Bld: 99 mg/dL (ref 65–99)
POTASSIUM: 4 mmol/L (ref 3.5–5.1)
SODIUM: 138 mmol/L (ref 135–145)

## 2017-04-14 LAB — MAGNESIUM: MAGNESIUM: 2.3 mg/dL (ref 1.7–2.4)

## 2017-04-14 NOTE — Progress Notes (Signed)
   Subjective:  No acute events overnight. On 4-5L O2 [place] during the day. Wearing BiPAP when seen. Reports feeling well and making jokes. No complaints this AM.    Objective:  Vital signs in last 24 hours: Vitals:   04/13/17 2025 04/14/17 0023 04/14/17 0100 04/14/17 0409  BP: 121/65 131/88  134/82  Pulse: 67  (!) 58 76  Resp: 16 20  (!) 31  Temp: 98.5 F (36.9 C) 98.2 F (36.8 C)  98.3 F (36.8 C)  TempSrc: Oral Axillary  Axillary  SpO2: 97% 100%  98%  Weight:    (!) 331 lb 1.6 oz (150.2 kg)  Height:       General: very pleasant male, obese, well-developed, sleeping in bed with BiPAP on in no acute distress  Cardiac: regular rate and rhythm, nl S1/S2, no murmurs, rubs or gallops  Pulm: difficult exam due to body habitus, no wheezes or crackles noted, no increased work of breathing while on BiPAP Abd: soft, NTND, bowel sounds present  Neuro: A&Ox3, able to move all 4 extremities with no focal deficits noted  Ext: warm and well perfused, no pitting edema noted bilaterally    Assessment/Plan:  Adam Dennis is a 56 yo with a history of combined systolic and diastolic heart failure (LVEF 45-50%, G1DD 08/2016), HTN, diet-controlled T2DM, HLD,CKD stage 4,COPD, OSA on CPAP,and chronic hypercarbic respiratory failure on 4L home oxygen who presents with acute onset of SOB and signs and symptoms of volume overload.  # Acute on chronic hypoxic/hypercapnic respiratory failure: Likely multifactorial (HF, OSA, and OHS).  On home oxygen with good oxygenation. Will continue BiPAP QHS per PCCM recs. Diuresing well with UOP 2.8L net -2.1L, but one pound weight gain s/p Bumex 2 BID. Remains euvolemic on exam. Renal function stable. Will continue to monitor weights and increase Bumex dose if indicated. TEE scheduled for 11/21 at 11AM.  -On 4-5L O2  (home oxygen) - TEE 11/21 - BiPAP QHS per PCCM recommendations - Continue to Bumex 2 mg BID  - Daily weights with strict I&O - Continue home Dulera  and Coreg 6.25 mg BID - Duonebs q8h PRN  - Continue PT   #CKD stage 4:Baseline Cr 2.9-3.2. Will continue to monitor while on diuretic therapy.  -Daily BMP   #T2DM:Currently diet controlled at home with last A1C = 6.1% on 03/12/2017. -CBG monitoring  #HTN:Normotensive -Continue home Imdur 30 mg daily and hydralazine TID  F: none  E:replete as needed N:HH + CMdiet   VTE prophylaxis:SQHeparin    Code Status:Fullcode  Dispo: Anticipated discharge in approximately 1-2 day(s) pending TEE.   Welford Roche, MD 04/14/2017, 8:06 AM Pager: [phone]

## 2017-04-14 NOTE — Progress Notes (Signed)
Physical Therapy Treatment Patient Details Name: Adam Dennis MRN: [id number] DOB: 05-30-1960 Today's Date: 04/14/2017    History of Present Illness Patient is a 56 yo male admitted 04/08/17 with Acute on Chronic Hypercarbic Respiratory Failure - in setting of decompensated CHF, OSA.  Patient requiring BiPAP at times.   PMH:  COPD, on O2 at 3-4 L, OSA on CPAP, CHF, CKD, morbid obesity, chronic LE edema, HTN, HLD, arthritis, DM, MI    PT Comments    Pt is progressing well with his mobility.  He walked the entire unit with supervision.  VSS presumably stable as there was artifact on his heart rhythm and his O2 sat waveform was poor. He reported no difficulty breathing during gait and had no audible DOE on 4 L O2 [place] (which is his baseline at home).  PT will continue to follow acutely to progress mobility.    Follow Up Recommendations  No PT follow up;Supervision - Intermittent     Equipment Recommendations  None recommended by PT    Recommendations for Other Services       Precautions / Restrictions Precautions Precautions: Other (comment) Precaution Comments: Monitor vitals    Mobility  Bed Mobility               General bed mobility comments: Pt OOB in the recliner chair.   Transfers Overall transfer level: Needs assistance Equipment used: None Transfers: Sit to/from Stand Sit to Stand: Supervision         General transfer comment: supervision for safety  Ambulation/Gait Ambulation/Gait assistance: Supervision Ambulation Distance (Feet): 300 Feet Assistive device: None Gait Pattern/deviations: Step-through pattern Gait velocity: decreased Gait velocity interpretation: Below normal speed for age/gender General Gait Details: Generally normall gait pattern for a man of his size, just slow and cautious.           Balance Overall balance assessment: No apparent balance deficits (not formally assessed)                                          Cognition Arousal/Alertness: Awake/alert Behavior During Therapy: WFL for tasks assessed/performed Overall Cognitive Status: Within Functional Limits for tasks assessed                                        Exercises      General Comments General comments (skin integrity, edema, etc.): HR max observed 124, but artifact in reading, also, O2 sats 77%, but pt with no DOE and poor waveform.  Pt on 4 L O2 [place] at home at baseline and that is what we walked with today.  I encouraged him to ask RN staff to walk with him again later.       Pertinent Vitals/Pain Pain Assessment: No/denies pain           PT Goals (current goals can now be found in the care plan section) Acute Rehab PT Goals Patient Stated Goal: To return home Progress towards PT goals: Progressing toward goals    Frequency    Min 3X/week      PT Plan Current plan remains appropriate       AM-PAC PT "6 Clicks" Daily Activity  Outcome Measure  Difficulty turning over in bed (including adjusting bedclothes, sheets and blankets)?: None Difficulty moving from lying on  back to sitting on the side of the bed? : A Little Difficulty sitting down on and standing up from a chair with arms (e.g., wheelchair, bedside commode, etc,.)?: None Help needed moving to and from a bed to chair (including a wheelchair)?: None Help needed walking in hospital room?: None Help needed climbing 3-5 steps with a railing? : A Little 6 Click Score: 22    End of Session Equipment Utilized During Treatment: Oxygen(4 L O2 [place]) Activity Tolerance: Patient tolerated treatment well Patient left: in chair;with call bell/phone within reach Nurse Communication: Mobility status PT Visit Diagnosis: Other abnormalities of gait and mobility (R26.89);Muscle weakness (generalized) (M62.81)     Time: 7494-4967 PT Time Calculation (min) (ACUTE ONLY): 27 min  Charges:  $Gait Training: 23-37 mins           Tangie Stay B. Verdie Barrows,  PT, DPT [phone]           04/14/2017, 11:41 AM

## 2017-04-14 NOTE — Progress Notes (Signed)
Patient continues to exhibit signs of hypercapnia associated with chronic respiratory failure secondary to severe COPD. Patient requires the use of NIV both QHS and daytime to help with exacerbation periods. The use of the NIV will treat the patient's high CO2 levels and can reduce the risk of exacerbations and future hospitalizations when used at night and during the day. Patient will need these advanced settings in conjunction with his current medication regimen; BiPAP is not an option due to its functional limitations and the severity of the patient's condition. Failure to have NIV available for use over a 24 hour period could lead to death.

## 2017-04-15 ENCOUNTER — Telehealth: Payer: Self-pay

## 2017-04-15 LAB — BASIC METABOLIC PANEL
Anion gap: 8 (ref 5–15)
BUN: 33 mg/dL — AB (ref 6–20)
CHLORIDE: 95 mmol/L — AB (ref 101–111)
CO2: 36 mmol/L — AB (ref 22–32)
Calcium: 8.2 mg/dL — ABNORMAL LOW (ref 8.9–10.3)
Creatinine, Ser: 2.46 mg/dL — ABNORMAL HIGH (ref 0.61–1.24)
GFR calc Af Amer: 32 mL/min — ABNORMAL LOW (ref >60)
GFR, EST NON AFRICAN AMERICAN: 28 mL/min — AB (ref >60)
GLUCOSE: 87 mg/dL (ref 65–99)
POTASSIUM: 4.1 mmol/L (ref 3.5–5.1)
Sodium: 139 mmol/L (ref 135–145)

## 2017-04-15 LAB — GLUCOSE, CAPILLARY
Glucose-Capillary: 89 mg/dL (ref 65–99)
Glucose-Capillary: 95 mg/dL (ref 65–99)
Glucose-Capillary: 110 mg/dL — ABNORMAL HIGH (ref 65–99)
Glucose-Capillary: 104 mg/dL — ABNORMAL HIGH (ref 65–99)

## 2017-04-15 LAB — MAGNESIUM: Magnesium: 2.3 mg/dL (ref 1.7–2.4)

## 2017-04-15 MED ORDER — SODIUM CHLORIDE 0.9 % IV SOLN: INTRAVENOUS | Status: DC

## 2017-04-15 NOTE — Progress Notes (Signed)
   Subjective:  No acute events overnight.  Sitting up in bed when seen this morning and doing well. No complaints this AM.  Eager to go home.  Objective:  Vital signs in last 24 hours: Vitals:   04/14/17 2345 04/15/17 0235 04/15/17 0550 04/15/17 0739  BP: 114/65  (!) 112/54 108/73  Pulse: 60 (!) 59 64 61  Resp: (!) 21 16 18  (!) 21  Temp: 98.3 F (36.8 C)  98.7 F (37.1 C) 97.8 F (36.6 C)  TempSrc: Oral   Oral  SpO2: 100% 100% 100% 100%  Weight:   (!) 328 lb 7.8 oz (149 kg)   Height:       General: very pleasant male, obese, well-developed, sitting up in bed in no acute distress  Cardiac: regular rate and rhythm, nl S1/S2, no murmurs, rubs or gallops  Pulm: difficult exam due to body habitus, no wheezes or crackles noted, no increased work of breathing while on BiPAP Neuro: A&Ox3, able to move all 4 extremities with no focal deficits noted  Ext: warm and well perfused, no pitting edema noted bilaterally   Assessment/Plan:  Adam Dennis is a 56 yo with a history of combined systolic and diastolic heart failure (LVEF 45-50%, G1DD 08/2016), HTN, diet-controlled T2DM, HLD,CKD stage 4,COPD, OSA on CPAP,and chronic hypercarbic respiratory failure on 4L home oxygen who presents with acute onset of SOB and signs and symptoms of volume overload.  # Acute on chronic hypoxic/hypercapnic respiratory failure: Likely multifactorial (HF, OSA, and OHS). On home oxygen with good oxygenation. Will continue BiPAP QHS.Marland Kitchen Diuresing well. Remains euvolemic on exam. Renal function continues to improve. Will continue to monitor weights and adjust Bumex dose if indicated. TEE scheduled for 11/21 at 11AM.  -On 4-5L O2 [place] (home oxygen) - TEE 11/21 - BiPAP QHS per PCCM recommendations - Continue to Bumex 2 mg BID  - Daily weights with strict I&O - Continue home Dulera and Coreg 6.25 mg BID - Duonebs q8h PRN  - Continue PT   #CKD stage 4:Baseline Cr 2.9-3.2. Will continue to monitor while on  diuretic therapy.  -Daily BMP   #T2DM:Currently diet controlled at home with last A1C = 6.1% on 03/12/2017. -CBG monitoring  #HTN:Normotensive -Continue home Imdur 30 mg daily and hydralazine TID  F: none  E:replete as needed N:HH + CMdiet   VTE prophylaxis:SQHeparin    Code Status:Fullcode  Dispo: Anticipated discharge in approximately 1-2 day(s) pending TEE.   Welford Roche, MD 04/15/2017, 10:26 AM Pager: [phone]

## 2017-04-15 NOTE — Telephone Encounter (Signed)
As per Page Spiro CM the patient may be discharged tomorrow. His appointment at [facility] was rescheduled to 04/23/17 @ 0945 and the information was placed on the AVS.

## 2017-04-15 NOTE — Progress Notes (Signed)
    [facility] has been requested to perform a transesophageal echocardiogram on Adam Dennis for possible pulmonary hypertension and unable to see valves on transthoracic echocardiogram.  After careful review of history and examination, the risks and benefits of transesophageal echocardiogram have been explained including risks of esophageal damage, perforation (1:10,000 risk), bleeding, pharyngeal hematoma as well as other potential complications associated with conscious sedation including aspiration, arrhythmia, respiratory failure and death. Alternatives to treatment were discussed, questions were answered. Patient is willing to proceed.   Cecilie Kicks, NP  04/15/2017 3:17 PM

## 2017-04-16 ENCOUNTER — Inpatient Hospital Stay (HOSPITAL_COMMUNITY): Payer: Medicare Other

## 2017-04-16 ENCOUNTER — Encounter (HOSPITAL_COMMUNITY): Payer: Self-pay | Admitting: *Deleted

## 2017-04-16 ENCOUNTER — Encounter (HOSPITAL_COMMUNITY)
Admission: EM | Disposition: A | Discharge status: Home Health | Payer: Self-pay | Source: Home / Self Care | Attending: Student in an Organized Health Care Education/Training Program

## 2017-04-16 ENCOUNTER — Inpatient Hospital Stay: Payer: Medicaid Other | Admitting: Family Medicine

## 2017-04-16 ENCOUNTER — Inpatient Hospital Stay (HOSPITAL_COMMUNITY): Payer: Medicare Other | Admitting: Anesthesiology

## 2017-04-16 DIAGNOSIS — Z9989 Dependence on other enabling machines and devices: Secondary | ICD-10-CM

## 2017-04-16 DIAGNOSIS — Z9981 Dependence on supplemental oxygen: Secondary | ICD-10-CM

## 2017-04-16 DIAGNOSIS — N184 Chronic kidney disease, stage 4 (severe): Secondary | ICD-10-CM

## 2017-04-16 DIAGNOSIS — I130 Hypertensive heart and chronic kidney disease with heart failure and stage 1 through stage 4 chronic kidney disease, or unspecified chronic kidney disease: Principal | ICD-10-CM

## 2017-04-16 DIAGNOSIS — I340 Nonrheumatic mitral (valve) insufficiency: Secondary | ICD-10-CM

## 2017-04-16 DIAGNOSIS — E785 Hyperlipidemia, unspecified: Secondary | ICD-10-CM

## 2017-04-16 DIAGNOSIS — E1122 Type 2 diabetes mellitus with diabetic chronic kidney disease: Secondary | ICD-10-CM

## 2017-04-16 DIAGNOSIS — Z79899 Other long term (current) drug therapy: Secondary | ICD-10-CM

## 2017-04-16 HISTORY — PX: TEE WITHOUT CARDIOVERSION: SHX5443

## 2017-04-16 LAB — BASIC METABOLIC PANEL
Anion gap: 7 (ref 5–15)
BUN: 34 mg/dL — AB (ref 6–20)
CHLORIDE: 94 mmol/L — AB (ref 101–111)
CO2: 36 mmol/L — AB (ref 22–32)
CREATININE: 2.57 mg/dL — AB (ref 0.61–1.24)
Calcium: 8.1 mg/dL — ABNORMAL LOW (ref 8.9–10.3)
GFR calc Af Amer: 31 mL/min — ABNORMAL LOW (ref >60)
GFR calc non Af Amer: 26 mL/min — ABNORMAL LOW (ref >60)
GLUCOSE: 89 mg/dL (ref 65–99)
Potassium: 4.1 mmol/L (ref 3.5–5.1)
Sodium: 137 mmol/L (ref 135–145)

## 2017-04-16 LAB — PROTIME-INR
INR: 1.05
PROTHROMBIN TIME: 13.7 s (ref 11.4–15.2)

## 2017-04-16 LAB — GLUCOSE, CAPILLARY
Glucose-Capillary: 85 mg/dL (ref 65–99)
Glucose-Capillary: 79 mg/dL (ref 65–99)

## 2017-04-16 LAB — MAGNESIUM: Magnesium: 2.3 mg/dL (ref 1.7–2.4)

## 2017-04-16 SURGERY — ECHOCARDIOGRAM, TRANSESOPHAGEAL
Anesthesia: Monitor Anesthesia Care

## 2017-04-16 MED ORDER — LACTATED RINGERS IV SOLN
INTRAVENOUS | Status: DC
  Administered 2017-04-16 (×2): via INTRAVENOUS

## 2017-04-16 MED ORDER — BUMETANIDE 2 MG PO TABS: 2.0000 mg | ORAL_TABLET | Freq: Two times a day (BID) | ORAL | 2 refills | Status: DC

## 2017-04-16 MED ORDER — PHENYLEPHRINE HCL 10 MG/ML IJ SOLN
INTRAMUSCULAR | Status: DC | PRN
  Administered 2017-04-16: 80 ug via INTRAVENOUS
  Administered 2017-04-16 (×2): 120 ug via INTRAVENOUS
  Administered 2017-04-16: 80 ug via INTRAVENOUS

## 2017-04-16 MED ORDER — FENTANYL CITRATE (PF) 100 MCG/2ML IJ SOLN: 25.0000 ug | INTRAMUSCULAR | Status: DC | PRN

## 2017-04-16 MED ORDER — PROPOFOL 500 MG/50ML IV EMUL
INTRAVENOUS | Status: DC | PRN
  Administered 2017-04-16: 100 ug/kg/min via INTRAVENOUS

## 2017-04-16 MED ORDER — ONDANSETRON HCL 4 MG/2ML IJ SOLN: 4.0000 mg | Freq: Once | INTRAMUSCULAR | Status: DC | PRN

## 2017-04-16 NOTE — Discharge Instructions (Addendum)
You were seen at [facility] for shortness of breath.   Please stop taking your home Lasix and start taking Bumex 2mg  2 times a day.  I sent a prescription to your pharmacy.  Please make sure to pick this up today and take your nighttime dose.   Make sure to weigh yourself daily every morning.  If your weight increases by 3 pounds in 1 day or 5 pounds in 1 week please call your doctor.  They might have to adjust your water pill dose.  Please avoid foods with high salt content such as frozen foods and canned foods.  These kind of foods can make you build of fluid again in your lungs.  You will have to use your portable oxygen at all times when you are awake. They will contact you to deliver the home vent.

## 2017-04-16 NOTE — Progress Notes (Signed)
Boris Sharper to be D/C'd home per MD order.  Discussed with the patient and all questions fully answered.  VSS, Skin clean, dry and intact without evidence of skin break down, no evidence of skin tears noted. IV catheter discontinued intact. Site without signs and symptoms of complications. Dressing and pressure applied.  An After Visit Summary was printed and given to the patient. Patient received prescription. Patient discharged on [facility]  D/c education completed with patient/family including follow up instructions, medication list, d/c activities limitations if indicated, with other d/c instructions as indicated by MD - patient able to verbalize understanding, all questions fully answered.   Patient instructed to return to ED, call 911, or call MD for any changes in condition.   Patient escorted via [place], and D/C home via private auto.  Milas Hock 04/16/2017 3:39 PM

## 2017-04-16 NOTE — Progress Notes (Signed)
   Subjective:  No acute events overnight. Sitting up in bed when seen this morning and doing well. No complaints this AM.   Objective:  Vital signs in last 24 hours: Vitals:   04/16/17 0412 04/16/17 0700 04/16/17 0815 04/16/17 0822  BP:  137/87 121/72   Pulse: 65 69    Resp: 12 18    Temp:  98.4 F (36.9 C)    TempSrc:  Axillary    SpO2: 99% 93%  94%  Weight:      Height:       General: very pleasant male, obese, well-developed, sitting up in bed in no acute distress  Cardiac: regular rate and rhythm, nl S1/S2, no murmurs, rubs or gallops  Pulm: difficult exam due to body habitus, no wheezes or crackles noted, no increased work of breathing while on supplemental oxygen Neuro: A&Ox3, able to move all 4 extremities with no focal deficits noted  Ext: warm and well perfused, no pitting edema noted bilaterally   Assessment/Plan:  Adam Dennis is a 56 yo with a history of combined systolic and diastolic heart failure (LVEF 45-50%, G1DD 08/2016), HTN, diet-controlled T2DM, HLD,CKD stage 4,COPD, OSA on CPAP,and chronic hypercarbic respiratory failure on 4L home oxygen who presents with acute onset of SOB and signs and symptoms of volume overload.  # Acute on chronic hypoxic/hypercapnic respiratory failure: Likely multifactorial (HF, OSA, and OHS). On home oxygen with good oxygenation. Will continue BiPAP QHS. Remains euvolemic on exam and weight is stable. Renal function stable. TEE today. -On 4-5L O2 [place] (home oxygen) - F/u TEE - BiPAP QHS per PCCM recommendations - Continue to Bumex 2 mg BID  - Daily weights with strict I&O - Continue home Dulera and Coreg 6.25 mg BID - Duonebs q8h PRN  - Continue PT   #CKD stage 4:Baseline Cr 2.9-3.2. Stable. Will continue to monitor while on diuretic therapy.  -Daily BMP   #T2DM:Currently diet controlled at home with last A1C = 6.1% on 03/12/2017. -CBG monitoring  #HTN:Normotensive -Continue home Imdur 30 mg daily and  hydralazine TID  F: none  E:replete as needed N:HH + CMdiet   VTE prophylaxis:SQHeparin    Code Status:Fullcode  Dispo: Anticipated discharge in approximately today-1 (s) pending TEE.   Welford Roche, MD 04/16/2017, 8:35 AM Pager: [phone]

## 2017-04-16 NOTE — H&P (Signed)
   INTERVAL PROCEDURE H&P  History and Physical Interval Note:  04/16/2017 9:55 AM  Adam Dennis has presented today for their planned procedure. The various methods of treatment have been discussed with the patient and family. After consideration of risks, benefits and other options for treatment, the patient has consented to the procedure.  [name] outpatient history has been reviewed, patient examined, and no change in status from most recent office note within the past 30 days. I have reviewed the patients' chart and labs and will proceed as planned. Questions were answered to the patient's satisfaction.   Adam Casino, MD, [facility], [place] Director of the [facility] Attending Cardiologist  Direct Dial: [phone]  Fax: [phone]  Website:  [email or web address] 04/16/2017, 9:55 AM

## 2017-04-16 NOTE — Discharge Summary (Signed)
Name: Adam Dennis MRN: [id number] DOB: 26-Oct-1960 56 y.o. PCP: Adam Morale, MD  Date of Admission: 04/08/2017  5:52 PM Date of Discharge: 04/16/2017 Attending Physician: Adam Dennis, *  Discharge Diagnosis: 1. Acute on chronic hypoxic/hypercarbic respiratory failure 2. Combined systolic and diastolic HF  Active Problems:   Acute on chronic combined systolic and diastolic congestive heart failure (HCC)   Acute on chronic combined systolic (congestive) and diastolic (congestive) heart failure (HCC)   Hypoxemia   OSA (obstructive sleep apnea)   Acute pulmonary edema ([place])   Discharge Medications: Allergies as of 04/16/2017   No Known Allergies     Medication List    STOP taking these medications   furosemide 40 MG tablet Commonly known as:  LASIX   NON FORMULARY     TAKE these medications   ACCU-CHEK AVIVA device Use as instructed daily.   accu-chek softclix lancets Use as instructed daily.   albuterol 108 (90 Base) MCG/ACT inhaler Commonly known as:  PROVENTIL HFA;VENTOLIN HFA Inhale 2 puffs into the lungs every 6 (six) hours as needed for wheezing or shortness of breath.   allopurinol 100 MG tablet Commonly known as:  ZYLOPRIM Take 1 tablet (100 mg total) by mouth daily.   amLODipine 10 MG tablet Commonly known as:  NORVASC Take 10 mg daily by mouth.   aspirin 325 MG tablet Take 325 mg by mouth daily.   atorvastatin 40 MG tablet Commonly known as:  LIPITOR Take 1 tablet (40 mg total) by mouth daily.   budesonide-formoterol 160-4.5 MCG/ACT inhaler Commonly known as:  SYMBICORT Inhale 2 puffs into the lungs 2 (two) times daily.   bumetanide 2 MG tablet Commonly known as:  BUMEX Take 1 tablet (2 mg total) by mouth 2 (two) times daily.   calcium carbonate 500 MG chewable tablet Commonly known as:  TUMS - dosed in mg elemental calcium Chew 1 tablet (200 mg of elemental calcium total) by mouth 2 (two) times daily.   carvedilol 6.25  MG tablet Commonly known as:  COREG Take 6.25 mg by mouth 2 (two) times daily.   glucose blood test strip Commonly known as:  ACCU-CHEK AVIVA Use as instructed daily   hydrALAZINE 50 MG tablet Commonly known as:  APRESOLINE Take 0.5 tablets (25 mg total) by mouth 3 (three) times daily.   isosorbide mononitrate 30 MG 24 hr tablet Commonly known as:  IMDUR Take 1 tablet (30 mg total) by mouth daily.   lisinopril 20 MG tablet Commonly known as:  PRINIVIL,ZESTRIL Take 20 mg by mouth daily.   nitroGLYCERIN 0.4 MG SL tablet Commonly known as:  NITROSTAT Place 1 tablet (0.4 mg total) under the tongue every 5 (five) minutes x 3 doses as needed for chest pain.   OXYGEN Inhale 4 L into the lungs daily.   TRAVATAN Z 0.004 % Soln ophthalmic solution Generic drug:  Travoprost (BAK Free) Place 1 drop into both eyes at bedtime.       Disposition and follow-up:   Adam Dennis was discharged from [facility] in Stable condition.  At the hospital follow up visit please address:  1.  Please assess volume status and compliance with Bumex. Will need BMP to assess renal function. Please assess compliance with home vent. Will need continued education on importance of low sodium diet, fluid restriction, and lifestyle modifications.   2.  Labs / imaging needed at time of follow-up: BMP   3.  Pending labs/ test needing follow-up:  None   Follow-up Appointments: Follow-up Information    [place]. Go on 04/23/2017.   Why:  at 9:45am for an appointment at the [facility] with Dr Jarold Song.  please bring your medications with you to the appointment.  Contact information: [place] [phone] [place], [facility] Follow up.   Specialty:  Home Health Services Why:  Registered Nurse.  Contact information: [place]  [id number] [address] Follow up.   Why:  Non-Invasive Vent Contact information: [address] [place] [id number] [place] Hospital Course by problem list:   Adam Dennis is a 56 yo with a history of combined systolic and diastolic heart failure (LVEF 45-50%, G1DD 08/2016), HTN, diet-controlled T2DM, HLD,CKD stage 4,COPD, OSA on CPAP,and chronichypercarbicrespiratory failure on 4L home oxygen who presents withacute onset of SOB and signs and symptoms of volume overload.  1. Acute on chronic hypoxic/hypercarbic respiratory failure: Patient presented with increased oxygen requirements and required BiPAP for 48 hrs. His A-a gradient was elevated and concern for shunt. TEE negative for intracardiac shunt. Unclear etiology, but likely multifactorial from HF, OHS, and OSA. Did not respond well to aggressive diuresis with IV Lasix and was switched to Bumex.  He diuresed well on Bumex and had improvement of respiratory status. He was discharged on 4-5L of supplemental oxygen. He was prescribed a home vent as well.Will need continued education on importance of low sodium diet, fluid restriction, and lifestyle modifications.    2. Combined systolic and diastolic HF: TTE non diagnostic. TEE showed improvement in LVEF 60%. Patient was switched from Lasix to Bumex as above. Lowest reported weight was 324 lbs and he was 328 lbs on day of discharge. He was discharged on Bumex 2 mg BID.  He was referred to the advanced heart failure clinic.   Discharge Vitals:   BP 127/75 (BP Location: Right Arm)   Pulse 61   Temp 98.8 F (37.1 C) (Oral)   Resp 18   Ht 6\' 3"  (1.905 m)   Wt (!) 328 lb 14.8 oz (149.2 kg)   SpO2 94%   BMI 41.11 kg/m   Pertinent Labs, Studies, and Procedures:   CXR: IMPRESSION: Cardiomegaly with findings of CHF, worsened compared to prior radiograph. Pneumonia is not excluded. Clinical correlation  is recommended.  TEE 11/21: IMPRESSION:  1. No LAA thrombus 2. Negative for PFO or extracardiac shunting by saline microbubble contrast study 3. Mild LAE 4. Moderate LVH 5. Trivial TR, RVSP 35 mmHg + RAP 6. LVEF 60-65% with grossly normal wall motion   Discharge Instructions: Discharge Instructions    (HEART FAILURE PATIENTS) Call MD:  Anytime you have any of the following symptoms: 1) 3 pound weight gain in 24 hours or 5 pounds in 1 week 2) shortness of breath, with or without a dry hacking cough 3) swelling in the hands, feet or stomach 4) if you have to sleep on extra pillows at night in order to breathe.   Complete by:  As directed    AMB referral to CHF clinic   Complete by:  As directed    Diet - low sodium heart healthy   Complete by:  As directed    Face-to-face encounter (required for Medicare/Medicaid patients)   Complete by:  As directed    I Welford Roche certify that this patient is under my care and that I, or a nurse practitioner or physician's assistant working with me, had a face-to-face encounter that meets the physician face-to-face encounter requirements with this patient on 04/16/2017. The encounter with the patient was in whole, or in part for the following medical condition(s) which is the primary reason for home health care (List medical condition): heart failure, assistance with medication intake   The encounter with the patient was in whole, or in part, for the following medical condition, which is the primary reason for home health care:  heart failure   I certify that, based on my findings, the following services are medically necessary home health services:  Nursing   Reason for Medically Necessary Home Health Services:  Other See Comments   My clinical findings support the need for the above services:  Shortness of breath with activity   Further, I certify that my clinical findings support that this patient is homebound due to:  Shortness of Breath  with activity   Home Health   Complete by:  As directed    To provide the following care/treatments:  RN   Increase activity slowly   Complete by:  As directed       Signed: Welford Roche, MD 04/17/2017, 10:51 AM   Pager: [phone]

## 2017-04-16 NOTE — Anesthesia Postprocedure Evaluation (Signed)
Anesthesia Post Note  Patient: Adam Dennis  Procedure(s) Performed: TRANSESOPHAGEAL ECHOCARDIOGRAM (TEE) (N/A )     Anesthesia Type: MAC Level of consciousness: awake and alert Pain management: pain level controlled Vital Signs Assessment: post-procedure vital signs reviewed and stable Respiratory status: spontaneous breathing, nonlabored ventilation, respiratory function stable and patient connected to nasal cannula oxygen Cardiovascular status: stable and blood pressure returned to baseline Postop Assessment: no apparent nausea or vomiting Anesthetic complications: no Comments: No antiemetics given due to MAC procedure, and no patient complaint of nausea/vomiting.     Last Vitals:  Vitals:   04/16/17 1045 04/16/17 1100  BP: (!) 105/41 127/75  Pulse: 71 61  Resp: 16 18  Temp:  37.1 C  SpO2: 93% 94%    Last Pain:  Vitals:   04/16/17 1100  TempSrc: Oral  PainSc:                  Catalina Gravel

## 2017-04-16 NOTE — CV Procedure (Signed)
TRANSESOPHAGEAL ECHOCARDIOGRAM (TEE) NOTE  INDICATIONS: CHF, poor TTE windows, ?pulmonary hypertension or shunt  PROCEDURE:   Informed consent was obtained prior to the procedure. The risks, benefits and alternatives for the procedure were discussed and the patient comprehended these risks.  Risks include, but are not limited to, cough, sore throat, vomiting, nausea, somnolence, esophageal and stomach trauma or perforation, bleeding, low blood pressure, aspiration, pneumonia, infection, trauma to the teeth and death.    After a procedural time-out, the patient was given propofol per anesthesia for sedation.  The patient's heart rate, blood pressure, and oxygen saturation are monitored continuously during the procedure.The oropharynx was anesthetized with 2 cetacaine sprays.  The transesophageal probe was inserted in the esophagus and stomach without difficulty and multiple views were obtained.  The patient was kept under observation until the patient left the procedure room.  The patient left the procedure room in stable condition.   Agitated microbubble saline contrast was administered.  COMPLICATIONS:    There were no immediate complications.  Findings:  1. LEFT VENTRICLE: The left ventricular wall thickness is moderately increased.  The left ventricular cavity is small in size. Wall motion is normal.  LVEF is 60-65%.  2. RIGHT VENTRICLE:  The right ventricle is normal in structure and function without any thrombus or masses.    3. LEFT ATRIUM:  The left atrium is mildly dilated in size without any thrombus or masses.  There is not spontaneous echo contrast ("smoke") in the left atrium consistent with a low flow state.  4. LEFT ATRIAL APPENDAGE:  The large, windsock-shaped left atrial appendage is free of any thrombus or masses. The appendage has single lobes. Pulse doppler indicates moderate flow in the appendage.  5. ATRIAL SEPTUM:  The atrial septum appears intact and is free of  thrombus and/or masses.  There is no evidence for interatrial shunting by color doppler and saline microbubble. No apparent evidence for late-shunting as well.  6. RIGHT ATRIUM:  The right atrium is normal in size and function without any thrombus or masses.  7. MITRAL VALVE:  The mitral valve is normal in structure and function with Mild regurgitation.  There were no vegetations or stenosis.  8. AORTIC VALVE:  The aortic valve is trileaflet, normal in structure and function with trivial central regurgitation.  There were no vegetations or stenosis  9. TRICUSPID VALVE:  The tricuspid valve is normal in structure and function with trivial regurgitation. RVSP was 35 mmHg + RAP - suggesting mild pulmonary hypertension. There were no vegetations or stenosis  10.  PULMONIC VALVE:  The pulmonic valve was poorly visualized. No significant regurgitation was noted.   11. AORTIC ARCH, ASCENDING AND DESCENDING AORTA:  There was no Ron Parker et. Al, 1992) atherosclerosis of the ascending aorta, aortic arch, or proximal descending aorta.  12. PULMONARY VEINS: Anomalous pulmonary venous return was not noted.  13. PERICARDIUM: The pericardium appeared normal and non-thickened.  There is no pericardial effusion.  IMPRESSION:   1. No LAA thrombus 2. Negative for PFO or extracardiac shunting by saline microbubble contrast study 3. Mild LAE 4. Moderate LVH 5. Trivial TR, RVSP 35 mmHg + RAP 6. LVEF 60-65% with grossly normal wall motion  RECOMMENDATIONS:    Compared to a prior study in 08/2016, the LVEF has normalized.  Time Spent Directly with the Patient:  45 minutes   [name], MD, [facility], [place] Director of the Advanced Lipid Disorders &  Cardiovascular Risk Reduction Clinic Attending Cardiologist  Direct Dial: [phone]  Fax: [phone]  Website:  [email or web address] 04/16/2017, 10:24 AM

## 2017-04-16 NOTE — Transfer of Care (Signed)
Immediate Anesthesia Transfer of Care Note  Patient: Adam Dennis  Procedure(s) Performed: TRANSESOPHAGEAL ECHOCARDIOGRAM (TEE) (N/A )  Patient Location: PACU  Anesthesia Type:MAC  Level of Consciousness: awake, oriented, patient cooperative and responds to stimulation  Airway & Oxygen Therapy: Patient Spontanous Breathing and Patient connected to nasal cannula oxygen  Post-op Assessment: Report given to RN and Post -op Vital signs reviewed and stable  Post vital signs: Reviewed and stable  Last Vitals:  Vitals:   04/16/17 0935 04/16/17 0940  BP:    Pulse:  66  Resp:  19  Temp:    SpO2: 96% 98%    Last Pain:  Vitals:   04/16/17 0929  TempSrc: Oral  PainSc:       Patients Stated Pain Goal: 0 (93/71/69 [id number])  Complications: No apparent anesthesia complications

## 2017-04-16 NOTE — Anesthesia Preprocedure Evaluation (Addendum)
Anesthesia Evaluation  Patient identified by MRN, date of birth, ID band Patient awake    Reviewed: Allergy & Precautions, NPO status , Patient's Chart, lab work & pertinent test results, reviewed documented beta blocker date and time   Airway Mallampati: II  TM Distance: >3 FB Neck ROM: Full    Dental  (+) Dental Advisory Given, Missing, Poor Dentition   Pulmonary sleep apnea and Continuous Positive Airway Pressure Ventilation , COPD,  COPD inhaler and oxygen dependent,     + decreased breath sounds      Cardiovascular hypertension, Pt. on medications and Pt. on home beta blockers + Past MI and +CHF  Normal cardiovascular exam Rhythm:Regular Rate:Normal  Echo 09/20/16: Study Conclusions  - Left ventricle: The cavity size was normal. Systolic function was mildly reduced. The estimated ejection fraction was in the range of 45% to 50%. There is moderate hypokinesis of the inferior myocardium. Doppler parameters are consistent with abnormal left   ventricular relaxation (grade 1 diastolic dysfunction).   Neuro/Psych PSYCHIATRIC DISORDERS Depression negative neurological ROS     GI/Hepatic negative GI ROS, Neg liver ROS,   Endo/Other  diabetes, Type 2, Oral Hypoglycemic AgentsMorbid obesity  Renal/GU Renal InsufficiencyRenal disease     Musculoskeletal  (+) Arthritis , Osteoarthritis,    Abdominal   Peds  Hematology  (+) Blood dyscrasia, anemia ,   Anesthesia Other Findings Day of surgery medications reviewed with the patient.  Reproductive/Obstetrics                            Anesthesia Physical Anesthesia Plan  ASA: IV  Anesthesia Plan: MAC   Post-op Pain Management:    Induction: Intravenous  PONV Risk Score and Plan: 1 and Propofol infusion and Ondansetron  Airway Management Planned: Nasal Cannula  Additional Equipment:   Intra-op Plan:   Post-operative Plan:   Informed  Consent: I have reviewed the patients History and Physical, chart, labs and discussed the procedure including the risks, benefits and alternatives for the proposed anesthesia with the patient or authorized representative who has indicated his/her understanding and acceptance.   Dental advisory given  Plan Discussed with: CRNA and Anesthesiologist  Anesthesia Plan Comments: (Discussed risks/benefits/alternatives to MAC sedation including need for ventilatory support, hypotension, need for conversion to general anesthesia.  All patient questions answered.  Patient/guardian wishes to proceed.)        Anesthesia Quick Evaluation

## 2017-04-18 ENCOUNTER — Encounter (HOSPITAL_COMMUNITY): Payer: Self-pay | Admitting: Internal Medicine

## 2017-04-21 ENCOUNTER — Ambulatory Visit (INDEPENDENT_AMBULATORY_CARE_PROVIDER_SITE_OTHER): Payer: Medicare Other | Admitting: Ophthalmology

## 2017-04-21 ENCOUNTER — Encounter (INDEPENDENT_AMBULATORY_CARE_PROVIDER_SITE_OTHER): Payer: Self-pay | Admitting: Ophthalmology

## 2017-04-21 ENCOUNTER — Telehealth: Payer: Self-pay

## 2017-04-21 DIAGNOSIS — H35413 Lattice degeneration of retina, bilateral: Secondary | ICD-10-CM | POA: Diagnosis not present

## 2017-04-21 DIAGNOSIS — E113393 Type 2 diabetes mellitus with moderate nonproliferative diabetic retinopathy without macular edema, bilateral: Secondary | ICD-10-CM

## 2017-04-21 DIAGNOSIS — H25813 Combined forms of age-related cataract, bilateral: Secondary | ICD-10-CM | POA: Diagnosis not present

## 2017-04-21 DIAGNOSIS — H35033 Hypertensive retinopathy, bilateral: Secondary | ICD-10-CM

## 2017-04-21 DIAGNOSIS — H3581 Retinal edema: Secondary | ICD-10-CM

## 2017-04-21 DIAGNOSIS — H3589 Other specified retinal disorders: Secondary | ICD-10-CM

## 2017-04-21 MED ORDER — PREDNISOLONE ACETATE 1 % OP SUSP: 1.0000 [drp] | Freq: Four times a day (QID) | OPHTHALMIC | 0 refills | Status: AC

## 2017-04-21 NOTE — Telephone Encounter (Signed)
Transitional [facility] Clinic Post-discharge Follow-Up Phone Call:  Date of Discharge:  04/16/2017 Principal Discharge Diagnosis(es):  Acute on chronic hypoxic respiratory failure, combined systolic and diastolic heart failure  Post-discharge Communication: (Clearly document all attempts clearly and date contact made) call placed to the patient Call Completed: Yes                    With Whom:patient Interpreter Needed: No    Please check all that apply:  X  Patient is knowledgeable of his/her condition(s) and/or treatment. ? Patient is caring for self at home.  X  Patient is receiving assist at home from family and/or caregiver. Family and/or caregiver is knowledgeable of patient's condition(s) and/or treatment. - has help from girlfriend.  X  Patient is receiving home health services. If so, name of agency. - referral was [facility] to [facility] for home health RN at the time of discharge.      Medication Reconciliation:  X  Medication list reviewed with patient.  - he stated that he did not need to review the medication list and did not have any questions about his medications.  X  Patient obtained all discharge medications. If not, why? He stated that he has all of his medications, including the bumex which was ordered at discharge. He stated that he has the list of discharge medications and understands that he is to stop taking the lasix. He noted that he has a scale at home and this morning his weight was 316 lbs. He also has a glucometer and reported that his blood sugar this morning before eating was 84.  He did confirm that he is no longer on prednisone as per the discharge instructions.    Activities of Daily Living:  ? Independent X  Needs assist (describe; ? home DME used) -has home O2 @ 4L and a non invasive vent ( NIV) from [facility].  He stated that he uses the NIV at night.  ? [facility] (describe, ? home DME used)   Community resources in place for patient:      None  X  Home Health/Home  DME - [facility] provides DME and home health nursing. He also receives home tele-monitoring from [facility]. ? Assisted Living ? Support Group          Patient Education: he has [facility] services but still needs to call Medicaid to register for transportation. This CM encouraged him to do so and he said that he has the phone # to call to register.         Questions/Concerns discussed:He stated that overall he is " feeling okay." no other questions/concerns reported. He confirmed his appointment with Dr Jarold Song for 04/23/17 @ 0945 and said he would need transportation. SCAT has not been arranged.  Informed him that a cab would be ordered and this would be confirmed with him tomorrow.

## 2017-04-21 NOTE — Progress Notes (Signed)
[facility] Note  04/21/2017     CHIEF COMPLAINT Patient presents for Retina Evaluation   HISTORY OF PRESENT ILLNESS: Adam Dennis is a 56 y.o. male who presents to the clinic today for:   HPI    Retina Evaluation    In right eye.  Associated Symptoms Floaters.  Negative for Flashes, Distortion, Pain, Photophobia, Trauma, Jaw Claudication, Fever, Fatigue, Weight Loss, Shoulder/Hip pain, Scalp Tenderness, Glare, Redness and Blind Spot.  Context:  distance vision, mid-range vision and near vision.  Treatments tried include no treatments.  I, the attending physician,  performed the HPI with the patient and updated documentation appropriately.          Comments    Pt presents on the referral of Dr. [name] for concern of RT OD; pt diabetic BS 85 this am. A1C level unkown per pt. Pt sees occasional floater OD. Has been there for "awhile." Pt denies FOL       Last edited by Alyse Low on 04/21/2017  4:24 PM. (History)    Pt states that he was seen by Dr. [name] for glaucoma follow up;   Referring physician: Debbra Riding, MD [address] [place], [place] [address]  HISTORICAL INFORMATION:   Selected notes from the MEDICAL RECORD NUMBER Referred by Dr. [name] for RT OD LEE- 11.26.18 ([facility] [place]) Ocular Hx- glaucoma OU (on travatan OU qd) PMH- DM, COPD, CHF, kidney disease    CURRENT MEDICATIONS: Current Outpatient Medications (Ophthalmic Drugs)  Medication Sig  . prednisoLONE acetate (PRED FORTE) 1 % ophthalmic suspension Place 1 drop into the right eye 4 (four) times daily for 7 days.  . TRAVATAN Z 0.004 % SOLN ophthalmic solution Place 1 drop into both eyes at bedtime.   No current facility-administered medications for this visit.  (Ophthalmic Drugs)   Current Outpatient Medications (Other)  Medication Sig  . albuterol (PROVENTIL HFA;VENTOLIN HFA) 108 (90 Base) MCG/ACT inhaler Inhale 2 puffs into the lungs every 6 (six)  hours as needed for wheezing or shortness of breath.  . allopurinol (ZYLOPRIM) 100 MG tablet Take 1 tablet (100 mg total) by mouth daily.  Marland Kitchen amLODipine (NORVASC) 10 MG tablet Take 10 mg daily by mouth.  Marland Kitchen aspirin 325 MG tablet Take 325 mg by mouth daily.  Marland Kitchen atorvastatin (LIPITOR) 40 MG tablet Take 1 tablet (40 mg total) by mouth daily.  . Blood Glucose Monitoring Suppl (ACCU-CHEK AVIVA) device Use as instructed daily.  . budesonide-formoterol (SYMBICORT) 160-4.5 MCG/ACT inhaler Inhale 2 puffs into the lungs 2 (two) times daily.  . bumetanide (BUMEX) 2 MG tablet Take 1 tablet (2 mg total) by mouth 2 (two) times daily.  . calcium carbonate (TUMS - DOSED IN MG ELEMENTAL CALCIUM) 500 MG chewable tablet Chew 1 tablet (200 mg of elemental calcium total) by mouth 2 (two) times daily.  . carvedilol (COREG) 6.25 MG tablet Take 6.25 mg by mouth 2 (two) times daily.  . furosemide (LASIX) 40 MG tablet Take 80 mg by mouth 2 (two) times daily.  Marland Kitchen glucose blood (ACCU-CHEK AVIVA) test strip Use as instructed daily  . hydrALAZINE (APRESOLINE) 50 MG tablet Take 0.5 tablets (25 mg total) by mouth 3 (three) times daily.  . isosorbide mononitrate (IMDUR) 30 MG 24 hr tablet Take 1 tablet (30 mg total) by mouth daily.  Elmore Guise Devices (ACCU-CHEK SOFTCLIX) lancets Use as instructed daily.  Marland Kitchen lisinopril (PRINIVIL,ZESTRIL) 20 MG tablet Take 20 mg  by mouth daily.  . nitroGLYCERIN (NITROSTAT) 0.4 MG SL tablet Place 1 tablet (0.4 mg total) under the tongue every 5 (five) minutes x 3 doses as needed for chest pain.  . OXYGEN Inhale 4 L into the lungs daily.   No current facility-administered medications for this visit.  (Other)      REVIEW OF SYSTEMS:    ALLERGIES No Known Allergies  PAST MEDICAL HISTORY Past Medical History:  Diagnosis Date  . Arthritis    "back" (02/06/2017)  . CHF (congestive heart failure) ([place])    new onset /Encounter Date 09/12/2006  . Chronic combined systolic and diastolic heart  failure ([place])   . CKD (chronic kidney disease) stage 4, GFR 15-29 ml/min ([facility])   . Glaucoma, right eye   . High cholesterol   . Hypertension   . Hypertrophy of tonsils alone   . Myocardial infarction [facility])    "small one; ~ 2008" (02/06/2017)  . Obesity, unspecified   . On home oxygen therapy    "4L; 24/7" (04/10/2017)  . OSA on CPAP   . Type II diabetes mellitus ([place])    Past Surgical History:  Procedure Laterality Date  . HERNIA REPAIR    . TEE WITHOUT CARDIOVERSION N/A 04/16/2017   Procedure: TRANSESOPHAGEAL ECHOCARDIOGRAM (TEE);  Surgeon: [name], MD;  Location: [facility], [place] ENDOSCOPY;  Service: Cardiovascular;  Laterality: N/A;  . UMBILICAL HERNIA REPAIR  1960s   "when I was little baby"    FAMILY HISTORY Family History  Problem Relation Age of Onset  . Hypertension Mother   . Diabetes Brother   . Diabetes Sister   . Cancer Maternal Aunt   . Amblyopia Neg Hx   . Blindness Neg Hx   . Cataracts Neg Hx   . Glaucoma Neg Hx   . Macular degeneration Neg Hx   . Strabismus Neg Hx   . Retinal detachment Neg Hx   . Retinitis pigmentosa Neg Hx     SOCIAL HISTORY Social History   Tobacco Use  . Smoking status: Never Smoker  . Smokeless tobacco: Never Used  Substance Use Topics  . Alcohol use: No  . Drug use: No         OPHTHALMIC EXAM:  Base Eye Exam    Visual Acuity (Snellen - Linear)      Right Left   Dist [place] 20/50 +2 20/30 -2   Dist ph [place] 20/20 -1 20/20 -1   Correction:  Glasses       Tonometry (Tonopen, 4:15 PM)      Right Left   Pressure 14 13       Pupils      Dark Light Shape React APD   Right 8 8 Round Minimal None   Left 8 8 Round Minimal None       Visual Fields      Left Right    Full Full       Extraocular Movement      Right Left    Full, Ortho Full, Ortho       Neuro/Psych    Oriented x3:  Yes   Mood/Affect:  Normal       Dilation    Both eyes:  1.0% Mydriacyl, 2.5% Phenylephrine @ 4:15 PM        Slit Lamp and Fundus  Exam    External Exam      Right Left   External Normal Normal       Slit Lamp Exam  Right Left   Lids/Lashes Dermatochalasis - upper lid Dermatochalasis - upper lid   Conjunctiva/Sclera melanosis melanosis   Cornea Clear with mild arcus Clear with mild arcus   Anterior Chamber Deep and quiet Deep and quiet   Iris Round and dilated, No NVI Round and dilated, No NVI   Lens 2+ Nuclear sclerosis, 2+ Cortical cataract 2+ Nuclear sclerosis, 2+ Cortical cataract   Vitreous mild Vitreous syneresis mild Vitreous syneresis       Fundus Exam      Right Left   Disc Normal Normal   C/D Ratio 0.1 0.3   Macula Flat, good foveal reflex, no heme or edema Flat, good foveal reflex, no heme or edema   Vessels copper wiring, Tortuous, dilated copper wiring, Tortuous, dilated   Periphery Two Inferior-temporal patches of pigmented lattice degeneration with retinal breaks - one with irregular break at 0730 no SRF, patch of lattice at 1030 with atrophic holes,  temporal blot hemorrhages,  Attached, 2 disc diameter resolving subretinal hemorrhage at 0130, Inferior-temporal Lattice degeneration - patches from 0300 to 0600 with atrophic holes        Refraction    Manifest Refraction      Sphere Cylinder Axis Dist VA   Right -1.00 +1.25 062 20/20-2   Left -1.00 +1.75 092 20/20-1          IMAGING AND PROCEDURES  Imaging and Procedures for 04/22/17  OCT, Retina - OU - Both Eyes     Right Eye Quality was good. Central Foveal Thickness: 258. Progression has no prior data. Findings include normal foveal contour, no IRF, no SRF.   Left Eye Quality was good. Central Foveal Thickness: 255. Progression has no prior data. Findings include normal foveal contour, no IRF, no SRF.   Notes Images taken, stored on drive  Diagnosis / Impression:  NFP, No IRF, No SRF OU  Clinical management:  See below  Abbreviations: NFP - Normal foveal profile. CME - cystoid macular edema. PED - pigment epithelial  detachment. IRF - intraretinal fluid. SRF - subretinal fluid. EZ - ellipsoid zone. ERM - epiretinal membrane. ORA - outer retinal atrophy. ORT - outer retinal tubulation. SRHM - subretinal hyper-reflective material         Repair Retinal Breaks, Laser - OD - Right Eye     LASER PROCEDURE NOTE  Procedure:  Barrier laser retinopexy using slit lamp laser, right eye   Diagnosis:   Lattice degeneration with atrophic holes/breaks, right eye                     2 patches inf temp; 1 patch sup temp  Surgeon: Bernarda Caffey, MD, PhD  Anesthesia: Topical  Informed consent obtained, operative eye marked, and time out performed prior to initiation of laser.   Laser settings:  Lumenis Smart532 laser, slit lamp Lens: [place] PRP 165 Power: 290 mW Spot size: 200 microns Duration: 50 msec  # spots: 541  Placement of laser: Using a [place]ster PRP 165 contact lens at the slit lamp, laser was placed in three confluent rows around each patch of lattice as described above with additional rows anteriorly.  Complications: None.  Patient tolerated the procedure well and received written and verbal post-procedure care information/education.                ASSESSMENT/PLAN:    ICD-10-CM   1. Retinal breaks without detachment H35.89 Repair Retinal Breaks, Laser - OD - Right Eye  2. Lattice degeneration of both  retinas H35.413 Repair Retinal Breaks, Laser - OD - Right Eye  3. Retinal edema H35.81 OCT, Retina - OU - Both Eyes  4. Hypertensive retinopathy of both eyes H35.033   5. 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 Combined form of age-related cataract, both eyes H25.813     1,2. Lattice degeneration with atrophic holes/breaks OU - discussed findings, prognosis and treatment options including laser retinopexy vs observation - recommend laser retinopexy OU - pt wishes to proceed with laser  retinopexy OD today (11.26.18) - RBA of procedure discussed, questions answered - informed consent obtained and signed - see procedure note - start PF QID OD x7 days - Follow-up 2-3 wks for possible laser retinopexy OS  3. No retinal edema  4. Hypertensive retinopathy OU - discussed importance of tight BP control - monitor  5. Moderate non-proliferative diabetic retinopathy w/o DME, both eyes - The incidence, risk factors for progression, natural history and treatment options for diabetic retinopathy were discussed with patient.   - The need for close monitoring of blood glucose, blood pressure, and serum lipids, avoiding cigarette or any type of tobacco, and the need for long term follow up was also discussed with patient. - exam with a few peripheral IRHs OU - will check FA at future visit for full assessment - OCT without diabetic macular edema, both eyes  - monitor  6. Combined forms of age related cataracts OU-  - The symptoms of cataract, surgical options, and treatments and risks were discussed with patient. - discussed diagnosis and progression - not yet visually significant - monitor for now  Ophthalmic Meds Ordered this visit:  Meds ordered this encounter  Medications  . prednisoLONE acetate (PRED FORTE) 1 % ophthalmic suspension    Sig: Place 1 drop into the right eye 4 (four) times daily for 7 days.    Dispense:  10 mL    Refill:  0      Return in about 2 weeks (around 05/05/2017) for F/U lattice degen OU; poss laser retinopexy OS.  There are no Patient Instructions on file for this visit.   Explained the diagnoses, plan, and follow up with the patient and they expressed understanding.  Patient expressed understanding of the importance of proper follow up care.   Gardiner Sleeper, M.D., Ph.D. Diseases & Surgery of the Retina and Vitreous [facility] 04/22/17     Abbreviations: M myopia (nearsighted); A astigmatism; H hyperopia  (farsighted); P presbyopia; Mrx spectacle prescription;  CTL contact lenses; OD right eye; OS left eye; OU both eyes  XT exotropia; ET esotropia; PEK punctate epithelial keratitis; PEE punctate epithelial erosions; DES dry eye syndrome; MGD meibomian gland dysfunction; ATs artificial tears; PFAT's preservative free artificial tears; [place] nuclear sclerotic cataract; PSC posterior subcapsular cataract; ERM epi-retinal membrane; PVD posterior vitreous detachment; RD retinal detachment; DM diabetes mellitus; DR diabetic retinopathy; NPDR non-proliferative diabetic retinopathy; PDR proliferative diabetic retinopathy; CSME clinically significant macular edema; DME diabetic macular edema; dbh dot blot hemorrhages; CWS cotton wool spot; POAG primary open angle glaucoma; C/D cup-to-disc ratio; HVF humphrey visual field; GVF goldmann visual field; OCT optical coherence tomography; IOP intraocular pressure; BRVO Branch retinal vein occlusion; CRVO central retinal vein occlusion; CRAO central retinal artery occlusion; BRAO branch retinal artery occlusion; RT retinal tear; SB scleral buckle; PPV pars plana vitrectomy; VH Vitreous hemorrhage; PRP panretinal laser photocoagulation; IVK intravitreal kenalog; VMT vitreomacular traction;  MH Macular hole;  NVD neovascularization of the disc; NVE neovascularization elsewhere; AREDS age related eye disease study; ARMD age related macular degeneration; POAG primary open angle glaucoma; EBMD epithelial/anterior basement membrane dystrophy; ACIOL anterior chamber intraocular lens; IOL intraocular lens; PCIOL posterior chamber intraocular lens; Phaco/IOL phacoemulsification with intraocular lens placement; [place] photorefractive keratectomy; LASIK laser assisted in situ keratomileusis; HTN hypertension; DM diabetes mellitus; COPD chronic obstructive pulmonary disease

## 2017-04-22 ENCOUNTER — Telehealth: Payer: Self-pay | Admitting: Family Medicine

## 2017-04-22 ENCOUNTER — Encounter (INDEPENDENT_AMBULATORY_CARE_PROVIDER_SITE_OTHER): Payer: Self-pay | Admitting: Ophthalmology

## 2017-04-22 DIAGNOSIS — G4733 Obstructive sleep apnea (adult) (pediatric): Secondary | ICD-10-CM | POA: Diagnosis not present

## 2017-04-22 DIAGNOSIS — I129 Hypertensive chronic kidney disease with stage 1 through stage 4 chronic kidney disease, or unspecified chronic kidney disease: Secondary | ICD-10-CM | POA: Diagnosis not present

## 2017-04-22 DIAGNOSIS — I5032 Chronic diastolic (congestive) heart failure: Secondary | ICD-10-CM | POA: Diagnosis not present

## 2017-04-22 DIAGNOSIS — J9621 Acute and chronic respiratory failure with hypoxia: Secondary | ICD-10-CM | POA: Diagnosis not present

## 2017-04-22 DIAGNOSIS — E1122 Type 2 diabetes mellitus with diabetic chronic kidney disease: Secondary | ICD-10-CM | POA: Diagnosis not present

## 2017-04-22 DIAGNOSIS — D649 Anemia, unspecified: Secondary | ICD-10-CM | POA: Diagnosis not present

## 2017-04-22 DIAGNOSIS — E669 Obesity, unspecified: Secondary | ICD-10-CM | POA: Diagnosis not present

## 2017-04-22 DIAGNOSIS — Z9981 Dependence on supplemental oxygen: Secondary | ICD-10-CM | POA: Diagnosis not present

## 2017-04-22 DIAGNOSIS — Z7982 Long term (current) use of aspirin: Secondary | ICD-10-CM | POA: Diagnosis not present

## 2017-04-22 DIAGNOSIS — I5043 Acute on chronic combined systolic (congestive) and diastolic (congestive) heart failure: Secondary | ICD-10-CM | POA: Diagnosis not present

## 2017-04-22 DIAGNOSIS — I130 Hypertensive heart and chronic kidney disease with heart failure and stage 1 through stage 4 chronic kidney disease, or unspecified chronic kidney disease: Secondary | ICD-10-CM | POA: Diagnosis not present

## 2017-04-22 DIAGNOSIS — J9622 Acute and chronic respiratory failure with hypercapnia: Secondary | ICD-10-CM | POA: Diagnosis not present

## 2017-04-22 DIAGNOSIS — N184 Chronic kidney disease, stage 4 (severe): Secondary | ICD-10-CM | POA: Diagnosis not present

## 2017-04-22 DIAGNOSIS — J449 Chronic obstructive pulmonary disease, unspecified: Secondary | ICD-10-CM | POA: Diagnosis not present

## 2017-04-22 DIAGNOSIS — Z7951 Long term (current) use of inhaled steroids: Secondary | ICD-10-CM | POA: Diagnosis not present

## 2017-04-22 NOTE — Telephone Encounter (Signed)
[facility] called Tommi Rumps) asking for verbal orders for pt they need 2-times a weeks for 2 weeks and 1-time a week for one week Please call Tommi Rumps at [phone]

## 2017-04-22 NOTE — Telephone Encounter (Signed)
Ok to give verbal orders?

## 2017-04-22 NOTE — Telephone Encounter (Signed)
Call placed to [employer] [phone] and spoke with Phillips Odor. Reserved pick up for patient tomorrow Wednesday 11/28 at 9:00am in the morning.   Called placed to patient regarding transportation. Informed and confirmed with patient that driver from [employer] will pick him up tomorrow 11/28 @ 9am. Patient understood and had no further questions.

## 2017-04-23 ENCOUNTER — Encounter: Payer: Self-pay | Admitting: Family Medicine

## 2017-04-23 ENCOUNTER — Ambulatory Visit: Payer: Medicare Other | Attending: Family Medicine | Admitting: Family Medicine

## 2017-04-23 VITALS — BP 97/59 | HR 67 | Temp 97.3°F | Ht 71.0 in | Wt 326.2 lb

## 2017-04-23 DIAGNOSIS — Z9889 Other specified postprocedural states: Secondary | ICD-10-CM | POA: Insufficient documentation

## 2017-04-23 DIAGNOSIS — J439 Emphysema, unspecified: Secondary | ICD-10-CM | POA: Diagnosis not present

## 2017-04-23 DIAGNOSIS — Z9981 Dependence on supplemental oxygen: Secondary | ICD-10-CM | POA: Insufficient documentation

## 2017-04-23 DIAGNOSIS — Z6841 Body Mass Index (BMI) 40.0 and over, adult: Secondary | ICD-10-CM | POA: Diagnosis not present

## 2017-04-23 DIAGNOSIS — J9611 Chronic respiratory failure with hypoxia: Secondary | ICD-10-CM

## 2017-04-23 DIAGNOSIS — I10 Essential (primary) hypertension: Secondary | ICD-10-CM | POA: Diagnosis not present

## 2017-04-23 DIAGNOSIS — E669 Obesity, unspecified: Secondary | ICD-10-CM | POA: Insufficient documentation

## 2017-04-23 DIAGNOSIS — Z79899 Other long term (current) drug therapy: Secondary | ICD-10-CM | POA: Insufficient documentation

## 2017-04-23 DIAGNOSIS — E1169 Type 2 diabetes mellitus with other specified complication: Secondary | ICD-10-CM

## 2017-04-23 DIAGNOSIS — I5042 Chronic combined systolic (congestive) and diastolic (congestive) heart failure: Secondary | ICD-10-CM | POA: Diagnosis not present

## 2017-04-23 DIAGNOSIS — I130 Hypertensive heart and chronic kidney disease with heart failure and stage 1 through stage 4 chronic kidney disease, or unspecified chronic kidney disease: Secondary | ICD-10-CM | POA: Diagnosis not present

## 2017-04-23 DIAGNOSIS — G4733 Obstructive sleep apnea (adult) (pediatric): Secondary | ICD-10-CM | POA: Insufficient documentation

## 2017-04-23 DIAGNOSIS — N184 Chronic kidney disease, stage 4 (severe): Secondary | ICD-10-CM | POA: Diagnosis not present

## 2017-04-23 DIAGNOSIS — Z7982 Long term (current) use of aspirin: Secondary | ICD-10-CM | POA: Diagnosis not present

## 2017-04-23 DIAGNOSIS — E1122 Type 2 diabetes mellitus with diabetic chronic kidney disease: Secondary | ICD-10-CM | POA: Diagnosis not present

## 2017-04-23 DIAGNOSIS — J438 Other emphysema: Secondary | ICD-10-CM

## 2017-04-23 LAB — GLUCOSE, POCT (MANUAL RESULT ENTRY): POC Glucose: 99 mg/dl (ref 70–99)

## 2017-04-23 MED ORDER — ALBUTEROL SULFATE HFA 108 (90 BASE) MCG/ACT IN AERS: 2.0000 | INHALATION_SPRAY | Freq: Four times a day (QID) | RESPIRATORY_TRACT | 3 refills | Status: DC | PRN

## 2017-04-23 MED ORDER — HYDRALAZINE HCL 50 MG PO TABS: 25.0000 mg | ORAL_TABLET | Freq: Three times a day (TID) | ORAL | 5 refills | Status: DC

## 2017-04-23 MED ORDER — BUDESONIDE-FORMOTEROL FUMARATE 160-4.5 MCG/ACT IN AERO: 2.0000 | INHALATION_SPRAY | Freq: Two times a day (BID) | RESPIRATORY_TRACT | 3 refills | Status: DC

## 2017-04-23 MED ORDER — ISOSORBIDE MONONITRATE ER 30 MG PO TB24: 30.0000 mg | ORAL_TABLET | Freq: Every day | ORAL | 5 refills | Status: DC

## 2017-04-23 MED ORDER — ATORVASTATIN CALCIUM 40 MG PO TABS: 40.0000 mg | ORAL_TABLET | Freq: Every day | ORAL | 3 refills | Status: DC

## 2017-04-23 NOTE — Patient Instructions (Signed)
Heart Failure °Heart failure means your heart has trouble pumping blood. This makes it hard for your body to work well. Heart failure is usually a long-term (chronic) condition. You must take good care of yourself and follow your doctor's treatment plan. °Follow these instructions at home: °· Take your heart medicine as told by your doctor. °? Do not stop taking medicine unless your doctor tells you to. °? Do not skip any dose of medicine. °? Refill your medicines before they run out. °? Take other medicines only as told by your doctor or pharmacist. °· Stay active if told by your doctor. The elderly and people with severe heart failure should talk with a doctor about physical activity. °· Eat heart-healthy foods. Choose foods that are without trans fat and are low in saturated fat, cholesterol, and salt (sodium). This includes fresh or frozen fruits and vegetables, fish, lean meats, fat-free or low-fat dairy foods, whole grains, and high-fiber foods. Lentils and dried peas and beans (legumes) are also good choices. °· Limit salt if told by your doctor. °· Cook in a healthy way. Roast, grill, broil, bake, poach, steam, or stir-fry foods. °· Limit fluids as told by your doctor. °· Weigh yourself every morning. Do this after you pee (urinate) and before you eat breakfast. Write down your weight to give to your doctor. °· Take your blood pressure and write it down if your doctor tells you to. °· Ask your doctor how to check your pulse. Check your pulse as told. °· Lose weight if told by your doctor. °· Stop smoking or chewing tobacco. Do not use gum or patches that help you quit without your doctor's approval. °· Schedule and go to doctor visits as told. °· Nonpregnant women should have no more than 1 drink a day. Men should have no more than 2 drinks a day. Talk to your doctor about drinking alcohol. °· Stop illegal drug use. °· Stay current with shots (immunizations). °· Manage your health conditions as told by your  doctor. °· Learn to manage your stress. °· Rest when you are tired. °· If it is really hot outside: °? Avoid intense activities. °? Use air conditioning or fans, or get in a cooler place. °? Avoid caffeine and alcohol. °? Wear loose-fitting, lightweight, and light-colored clothing. °· If it is really cold outside: °? Avoid intense activities. °? Layer your clothing. °? Wear mittens or gloves, a hat, and a scarf when going outside. °? Avoid alcohol. °· Learn about heart failure and get support as needed. °· Get help to maintain or improve your quality of life and your ability to care for yourself as needed. °Contact a doctor if: °· You gain weight quickly. °· You are more short of breath than usual. °· You cannot do your normal activities. °· You tire easily. °· You cough more than normal, especially with activity. °· You have any or more puffiness (swelling) in areas such as your hands, feet, ankles, or belly (abdomen). °· You cannot sleep because it is hard to breathe. °· You feel like your heart is beating fast (palpitations). °· You get dizzy or light-headed when you stand up. °Get help right away if: °· You have trouble breathing. °· There is a change in mental status, such as becoming less alert or not being able to focus. °· You have chest pain or discomfort. °· You faint. °This information is not intended to replace advice given to you by your health care provider. Make sure you   discuss any questions you have with your health care provider. °Document Released: 02/20/2008 Document Revised: 10/19/2015 Document Reviewed: 06/29/2012 °Elsevier Interactive Patient Education © 2017 [employer] ° °

## 2017-04-23 NOTE — Telephone Encounter (Signed)
Ok

## 2017-04-23 NOTE — Progress Notes (Signed)
Adam Dennis  Date of Telephone Encounter: 04/21/17  Date of 1st service: 04/23/17   Admit Date: 04/08/17 Discharge Date:04/16/17     Subjective:  Patient ID: Adam Dennis, male    DOB: 06-07-1960  Age: 56 y.o. MRN: [id number]  CC: COPD   HPI Adam Dennis is a 56 year old male with a history of hypertension, stage III- IVchronic kidney disease, obstructive sleep apnea, chronic respiratory failure with hypoxia (on 4 L of oxygen at rest), type 2 diabetes mellitus (A1c 6.2), combined systolic and diastolic CHF (EF 89-21% from 08/2016) followed by cardiology (previously followed by Dr. Doylene Canard), COPD (diagnosed on PFT from 02/19/17) who presents for a follow up at the transitional care clinic after a recent hospitalization for acute on chronic respiratory failure secondary to CHF exacerbation.   He had presented with worsening shortness of breath and was found to be in fluid overload.  He was placed on BiPAP due to increased oxygen requirements; IV Lasix administered which was subsequently changed to Bumex due to poor response to the former. Chest x-ray revealed cardiomegaly with findings of CHF, worsened compared to prior, pneumonia is not excluded. Echocardiogram was nondiagnostic  TTE from 04/16/17: Impressions:  - LVEF 60-65%, moderate LVH, no LAA thrombus, negative for PFO or   AV shunting by color doppler, RVSP elevated at 35 mmHg + RAP.  His condition improved and he was discharged on Bumex, 4-5 L of oxygen as well as a home vent with an appointment with heart failure clinic.  He reports doing well today and has been compliant with his home vent as well as 4 L of oxygen. He has no complaints at this time. He has an upcoming appointment with a heart failure clinic on 05/02/17 (he is requesting a new Cardiologist) and with pulmonary on 04/28/17. I have reviewed his daily weight logs which reveal his weight has been  between 312 and 318 since discharge. His  blood pressure logs reveal fluctuations between 119-161/81. He denies pedal edema or worsening shortness of breath.  Past Medical History:  Diagnosis Date  . Arthritis    "back" (02/06/2017)  . CHF (congestive heart failure) ([place])    new onset /Encounter Date 09/12/2006  . Chronic combined systolic and diastolic heart failure ([place])   . CKD (chronic kidney disease) stage 4, GFR 15-29 ml/min (HCC)   . Glaucoma, right eye   . High cholesterol   . Hypertension   . Hypertrophy of tonsils alone   . Myocardial infarction [facility])    "small one; ~ 2008" (02/06/2017)  . Obesity, unspecified   . On home oxygen therapy    "4L; 24/7" (04/10/2017)  . OSA on CPAP   . Type II diabetes mellitus ([place])     Past Surgical History:  Procedure Laterality Date  . HERNIA REPAIR    . TEE WITHOUT CARDIOVERSION N/A 04/16/2017   Procedure: TRANSESOPHAGEAL ECHOCARDIOGRAM (TEE);  Surgeon: [name], MD;  Location: [facility] ENDOSCOPY;  Service: Cardiovascular;  Laterality: N/A;  . [place]   "when I was little baby"    No Known Allergies   Outpatient Medications Prior to Visit  Medication Sig Dispense Refill  . allopurinol (ZYLOPRIM) 100 MG tablet Take 1 tablet (100 mg total) by mouth daily. 30 tablet 6  . amLODipine (NORVASC) 10 MG tablet Take 10 mg daily by mouth.  0  . aspirin 325 MG tablet Take 325 mg by mouth daily.    . Blood Glucose Monitoring  Suppl (ACCU-CHEK AVIVA) device Use as instructed daily. 1 each 0  . bumetanide (BUMEX) 2 MG tablet Take 1 tablet (2 mg total) by mouth 2 (two) times daily. 60 tablet 2  . calcium carbonate (TUMS - DOSED IN MG ELEMENTAL CALCIUM) 500 MG chewable tablet Chew 1 tablet (200 mg of elemental calcium total) by mouth 2 (two) times daily.    . carvedilol (COREG) 6.25 MG tablet Take 6.25 mg by mouth 2 (two) times daily.  0  . furosemide (LASIX) 40 MG tablet Take 80 mg by mouth 2 (two) times daily.  0  . glucose blood (ACCU-CHEK AVIVA) test strip Use  as instructed daily 100 each 12  . Lancet Devices (ACCU-CHEK SOFTCLIX) lancets Use as instructed daily. 1 each 5  . lisinopril (PRINIVIL,ZESTRIL) 20 MG tablet Take 20 mg by mouth daily.  0  . nitroGLYCERIN (NITROSTAT) 0.4 MG SL tablet Place 1 tablet (0.4 mg total) under the tongue every 5 (five) minutes x 3 doses as needed for chest pain. 25 tablet 1  . OXYGEN Inhale 4 L into the lungs daily.    . prednisoLONE acetate (PRED FORTE) 1 % ophthalmic suspension Place 1 drop into the right eye 4 (four) times daily for 7 days. 10 mL 0  . TRAVATAN Z 0.004 % SOLN ophthalmic solution Place 1 drop into both eyes at bedtime.  12  . albuterol (PROVENTIL HFA;VENTOLIN HFA) 108 (90 Base) MCG/ACT inhaler Inhale 2 puffs into the lungs every 6 (six) hours as needed for wheezing or shortness of breath. 1 Inhaler 3  . atorvastatin (LIPITOR) 40 MG tablet Take 1 tablet (40 mg total) by mouth daily. 30 tablet 3  . budesonide-formoterol (SYMBICORT) 160-4.5 MCG/ACT inhaler Inhale 2 puffs into the lungs 2 (two) times daily. 1 Inhaler 3  . hydrALAZINE (APRESOLINE) 50 MG tablet Take 0.5 tablets (25 mg total) by mouth 3 (three) times daily. 90 tablet 2  . isosorbide mononitrate (IMDUR) 30 MG 24 hr tablet Take 1 tablet (30 mg total) by mouth daily. 30 tablet 5   No facility-administered medications prior to visit.     ROS Review of Systems  Constitutional: Negative for activity change and appetite change.  HENT: Negative for sinus pressure and sore throat.   Eyes: Negative for visual disturbance.  Respiratory: Positive for shortness of breath. Negative for cough and chest tightness.   Cardiovascular: Negative for chest pain and leg swelling.  Gastrointestinal: Negative for abdominal distention, abdominal pain, constipation and diarrhea.  Endocrine: Negative.   Genitourinary: Negative for dysuria.  Musculoskeletal: Negative for joint swelling and myalgias.  Skin: Negative for rash.  Allergic/Immunologic: Negative.     Neurological: Negative for weakness, light-headedness and numbness.  Psychiatric/Behavioral: Negative for dysphoric mood and suicidal ideas.    Objective:  BP (!) 97/59   Pulse 67   Temp (!) 97.3 F (36.3 C) (Oral)   Ht 5' 11" (1.803 m)   Wt (!) 326 lb 3.2 oz (148 kg)   SpO2 100%   BMI 45.50 kg/m   BP/Weight 04/23/2017 04/16/2017 46/27/0350  Systolic BP 97 093 818  Diastolic BP 59 75 77  Wt. (Lbs) 326.2 328.93 352  BMI 45.5 41.11 49.09      Physical Exam  Constitutional: He is oriented to person, place, and time. He appears well-developed and well-nourished.  HENT:  4L oxygen at rest  Cardiovascular: Normal rate, normal heart sounds and intact distal pulses.  No murmur heard. Pulmonary/Chest: Effort normal and breath sounds normal. He has  no wheezes. He has no rales. He exhibits no tenderness.  Abdominal: Soft. Bowel sounds are normal. He exhibits distension. He exhibits no mass. There is no tenderness.  Musculoskeletal: Normal range of motion.  Neurological: He is alert and oriented to person, place, and time.  Skin: Skin is warm and dry.  Psychiatric: He has a normal mood and affect.     Lab Results  Component Value Date   HGBA1C 6.1 03/12/2017    Assessment & Plan:   1. Diabetes mellitus type 2 in obese (HCC) Controlled with A1c of 6.1 Continue diet control - POCT glucose (manual entry) - CMP14+EGFR - atorvastatin (LIPITOR) 40 MG tablet; Take 1 tablet (40 mg total) by mouth daily.  Dispense: 30 tablet; Refill: 3  2. Other emphysema ([place]) No acute exacerbation Keep upcoming appointment with pulmonology - albuterol (PROVENTIL HFA;VENTOLIN HFA) 108 (90 Base) MCG/ACT inhaler; Inhale 2 puffs into the lungs every 6 (six) hours as needed for wheezing or shortness of breath.  Dispense: 1 Inhaler; Refill: 3 - budesonide-formoterol (SYMBICORT) 160-4.5 MCG/ACT inhaler; Inhale 2 puffs into the lungs 2 (two) times daily.  Dispense: 1 Inhaler; Refill: 3  3. Essential  hypertension, benign Blood pressure is on the low side today, 97/59 - his home blood pressure logs reveal elevation up to 161/81 No regimen change today as he is asymptomatic Low-sodium diet, DASH diet - isosorbide mononitrate (IMDUR) 30 MG 24 hr tablet; Take 1 tablet (30 mg total) by mouth daily.  Dispense: 30 tablet; Refill: 5 - hydrALAZINE (APRESOLINE) 50 MG tablet; Take 0.5 tablets (25 mg total) by mouth 3 (three) times daily.  Dispense: 90 tablet; Refill: 5  4. Chronic combined systolic and diastolic CHF (congestive heart failure) (HCC) EF 01-77%, normal systolic function, moderate LVH, no left atrial thrombus from TEE of 04/16/17 Euvolemic Continue daily weight checks, low-sodium, heart healthy diet Keep upcoming appointment with a heart failure clinic - isosorbide mononitrate (IMDUR) 30 MG 24 hr tablet; Take 1 tablet (30 mg total) by mouth daily.  Dispense: 30 tablet; Refill: 5 - hydrALAZINE (APRESOLINE) 50 MG tablet; Take 0.5 tablets (25 mg total) by mouth 3 (three) times daily.  Dispense: 90 tablet; Refill: 5  5. CKD (chronic kidney disease) stage 4, GFR 15-29 ml/min (HCC) Stage III-IV Recently seen by his nephrologist-[facility]  6. Chronic respiratory failure with hypoxia (HCC) Currently on 4 L of oxygen at rest Vent at night   Meds ordered this encounter  Medications  . albuterol (PROVENTIL HFA;VENTOLIN HFA) 108 (90 Base) MCG/ACT inhaler    Sig: Inhale 2 puffs into the lungs every 6 (six) hours as needed for wheezing or shortness of breath.    Dispense:  1 Inhaler    Refill:  3  . atorvastatin (LIPITOR) 40 MG tablet    Sig: Take 1 tablet (40 mg total) by mouth daily.    Dispense:  30 tablet    Refill:  3  . budesonide-formoterol (SYMBICORT) 160-4.5 MCG/ACT inhaler    Sig: Inhale 2 puffs into the lungs 2 (two) times daily.    Dispense:  1 Inhaler    Refill:  3  . isosorbide mononitrate (IMDUR) 30 MG 24 hr tablet    Sig: Take 1 tablet (30 mg total) by  mouth daily.    Dispense:  30 tablet    Refill:  5  . hydrALAZINE (APRESOLINE) 50 MG tablet    Sig: Take 0.5 tablets (25 mg total) by mouth 3 (three) times daily.  Dispense:  90 tablet    Refill:  5    Follow-up: Return in about 3 weeks (around 05/14/2017) for TCC - Follow-up of CHF.   [name] MD

## 2017-04-23 NOTE — Telephone Encounter (Signed)
cory was called and given verbal orders for home health. 

## 2017-04-24 ENCOUNTER — Telehealth: Payer: Self-pay | Admitting: Family Medicine

## 2017-04-24 DIAGNOSIS — J449 Chronic obstructive pulmonary disease, unspecified: Secondary | ICD-10-CM | POA: Diagnosis not present

## 2017-04-24 DIAGNOSIS — I130 Hypertensive heart and chronic kidney disease with heart failure and stage 1 through stage 4 chronic kidney disease, or unspecified chronic kidney disease: Secondary | ICD-10-CM | POA: Diagnosis not present

## 2017-04-24 DIAGNOSIS — J9621 Acute and chronic respiratory failure with hypoxia: Secondary | ICD-10-CM | POA: Diagnosis not present

## 2017-04-24 DIAGNOSIS — N184 Chronic kidney disease, stage 4 (severe): Secondary | ICD-10-CM | POA: Diagnosis not present

## 2017-04-24 DIAGNOSIS — E1122 Type 2 diabetes mellitus with diabetic chronic kidney disease: Secondary | ICD-10-CM | POA: Diagnosis not present

## 2017-04-24 DIAGNOSIS — I5043 Acute on chronic combined systolic (congestive) and diastolic (congestive) heart failure: Secondary | ICD-10-CM | POA: Diagnosis not present

## 2017-04-24 LAB — CMP14+EGFR
ALBUMIN: 3.6 g/dL (ref 3.5–5.5)
ALT: 12 IU/L (ref 0–44)
AST: 14 IU/L (ref 0–40)
Albumin/Globulin Ratio: 1 — ABNORMAL LOW (ref 1.2–2.2)
Alkaline Phosphatase: 42 IU/L (ref 39–117)
BILIRUBIN TOTAL: 0.6 mg/dL (ref 0.0–1.2)
BUN / CREAT RATIO: 9 (ref 9–20)
BUN: 30 mg/dL — AB (ref 6–24)
CHLORIDE: 100 mmol/L (ref 96–106)
CO2: 26 mmol/L (ref 20–29)
CREATININE: 3.29 mg/dL — AB (ref 0.76–1.27)
Calcium: 8.9 mg/dL (ref 8.7–10.2)
GFR calc non Af Amer: 20 mL/min/{1.73_m2} — ABNORMAL LOW (ref >59)
GFR, EST AFRICAN AMERICAN: 23 mL/min/{1.73_m2} — AB (ref >59)
GLOBULIN, TOTAL: 3.6 g/dL (ref 1.5–4.5)
GLUCOSE: 83 mg/dL (ref 65–99)
Potassium: 4.5 mmol/L (ref 3.5–5.2)
Sodium: 142 mmol/L (ref 134–144)
TOTAL PROTEIN: 7.2 g/dL (ref 6.0–8.5)

## 2017-04-24 NOTE — Telephone Encounter (Signed)
Patient called stating that he woke up and had gained 4lbs and that Dr. Jarold Song said to call him if he gained over 3lbs. Please fu

## 2017-04-24 NOTE — Telephone Encounter (Signed)
Pt was called and informed of medication change for the next 3 days.

## 2017-04-24 NOTE — Telephone Encounter (Signed)
Will route to PCP 

## 2017-04-24 NOTE — Telephone Encounter (Signed)
Please advise him to take an extra Bumex (2mg ) in the morning for the next 3 days to add up to a total of 4 mg then return back to regular dose, restrict sodium intake and limit fluid intake to 2 L/day.

## 2017-04-28 ENCOUNTER — Encounter: Payer: Self-pay | Admitting: Internal Medicine

## 2017-04-28 ENCOUNTER — Ambulatory Visit: Payer: Medicare Other | Admitting: Internal Medicine

## 2017-04-28 ENCOUNTER — Other Ambulatory Visit (INDEPENDENT_AMBULATORY_CARE_PROVIDER_SITE_OTHER): Payer: Medicare Other

## 2017-04-28 VITALS — BP 120/70 | HR 68 | Ht 72.0 in | Wt 326.4 lb

## 2017-04-28 DIAGNOSIS — E662 Morbid (severe) obesity with alveolar hypoventilation: Secondary | ICD-10-CM

## 2017-04-28 DIAGNOSIS — N189 Chronic kidney disease, unspecified: Secondary | ICD-10-CM

## 2017-04-28 DIAGNOSIS — J9611 Chronic respiratory failure with hypoxia: Secondary | ICD-10-CM

## 2017-04-28 DIAGNOSIS — I5042 Chronic combined systolic (congestive) and diastolic (congestive) heart failure: Secondary | ICD-10-CM | POA: Diagnosis not present

## 2017-04-28 DIAGNOSIS — J9612 Chronic respiratory failure with hypercapnia: Secondary | ICD-10-CM

## 2017-04-28 DIAGNOSIS — G4733 Obstructive sleep apnea (adult) (pediatric): Secondary | ICD-10-CM

## 2017-04-28 DIAGNOSIS — D638 Anemia in other chronic diseases classified elsewhere: Secondary | ICD-10-CM

## 2017-04-28 LAB — CBC WITH DIFFERENTIAL/PLATELET
BASOS PCT: 0.8 % (ref 0.0–3.0)
Basophils Absolute: 0 10*3/uL (ref 0.0–0.1)
EOS PCT: 1 % (ref 0.0–5.0)
Eosinophils Absolute: 0.1 10*3/uL (ref 0.0–0.7)
HCT: 39.3 % (ref 39.0–52.0)
Hemoglobin: 12 g/dL — ABNORMAL LOW (ref 13.0–17.0)
LYMPHS ABS: 1.2 10*3/uL (ref 0.7–4.0)
Lymphocytes Relative: 17.8 % (ref 12.0–46.0)
MCHC: 30.5 g/dL (ref 30.0–36.0)
MCV: 83.7 fl (ref 78.0–100.0)
MONO ABS: 0.8 10*3/uL (ref 0.1–1.0)
MONOS PCT: 12.4 % — AB (ref 3.0–12.0)
NEUTROS PCT: 68 % (ref 43.0–77.0)
Neutro Abs: 4.4 10*3/uL (ref 1.4–7.7)
Platelets: 270 10*3/uL (ref 150.0–400.0)
RBC: 4.69 Mil/uL (ref 4.22–5.81)
RDW: 21.4 % — AB (ref 11.5–15.5)
WBC: 6.5 10*3/uL (ref 4.0–10.5)

## 2017-04-28 LAB — BASIC METABOLIC PANEL
BUN: 25 mg/dL — AB (ref 6–23)
CHLORIDE: 100 meq/L (ref 96–112)
CO2: 29 meq/L (ref 19–32)
Calcium: 9.4 mg/dL (ref 8.4–10.5)
Creatinine, Ser: 2.84 mg/dL — ABNORMAL HIGH (ref 0.40–1.50)
GFR: 29.88 mL/min — AB (ref >60.00)
GLUCOSE: 87 mg/dL (ref 70–99)
POTASSIUM: 3.6 meq/L (ref 3.5–5.1)
SODIUM: 140 meq/L (ref 135–145)

## 2017-04-28 NOTE — Progress Notes (Signed)
Subjective:    Patient ID: Adam Dennis, male    DOB: Jun 20, 1960, 56 y.o.   MRN: [id number]     HPI   IOV 04/28/2017  Chief Complaint  Patient presents with  . Advice Only    Referred by [facility] due to pt having COPD. Denies any complaints of cough, SOB, or CP at this current time but states that with COPD he does become SOB with exertion. DME: [facility], 4L O77.     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  History is gained from talking to the patient, review of the outside/old chart and lab results.  As best as I can gather things approximately in 2008 he was diagnosed with sleep apnea based on a sleep study at [facility].  Apparently after that he did see 1 of our practice physicians and was placed on CPAP but since then has not followed up.  He does not have ongoing physician care for his sleep apnea.  He admits to coexisting chronic renal insufficiency and chronic systolic heart failure and anemia of chronic disease.  He no longer works as a Biomedical scientist.  Then mid to late November 2018 he was diagnosed with acute on chronic hypoxemic and hypercarbic rest tori failure with an ABG showing a PCO2 in the 70s.  He was diuresed started on BiPAP therapy and discharged from the hospital.  He states since then he has been on nocturnal BiPAP instead of CPAP.  He is also on chronic daily 4 L of oxygen at rest which is been on for the last few years.  Currently he is feeling better and back to baseline.  He states he is here to establish pulmonary care.  His COPD Score which was given on the assumption he might have COPD is 20 showing significant amount of shortness of breath and limitations with activities of daily living probably on account of his obesity.  An Epworth sleep questionnaire was administered and is score is currently 7.  He says he has a  slight chance of sleeping when he watches TV or is in a passenger in a car for an hour without a break and a moderate chance of sleeping when he is lying down to rest in the afternoon but otherwise he has no chance of sleeping while sitting and reading or sitting quietly after lunch or in a stopped car or talking to somebody.  Walking desaturation test 185 feet x3 laps on room air: desaturated on room air to 86% but only with exdertion. Wsa fine at rest. Correcte with 2 LNC  LABS /CXR images - personally visualzied  CAT COPD Symptom & Quality of Life Score ([place]) 0 is no burden. 5 is highest burden 04/28/2017   Never Cough -> Cough all the time 2  No phlegm in chest -> Chest is full of phlegm 0  No chest tightness -> Chest feels very tight 0  No dyspnea for 1 flight stairs/hill -> Very dyspneic for 1 flight of stairs 4  No limitations for ADL at home -> Very limited with ADL at home 4  Confident leaving home -> Not at all confident leaving home 4  Sleep soundly -> Do not sleep soundly because of lung condition 3  Lots of Energy -> No energy at all 3  TOTAL Score (max 40)  20  has a past medical history of Arthritis, CHF (congestive heart failure) ([place]), Chronic combined systolic and diastolic heart failure ([place]), CKD (chronic kidney disease) stage 4, GFR 15-29 ml/min (HCC), Glaucoma, right eye, High cholesterol, Hypertension, Hypertrophy of tonsils alone, Myocardial infarction ([place]), Obesity, unspecified, On home oxygen therapy, OSA on CPAP, and Type II diabetes mellitus ([place]).   reports that  has never smoked. he has never used smokeless tobacco.  Past Surgical History:  Procedure Laterality Date  . HERNIA REPAIR    . TEE WITHOUT CARDIOVERSION N/A 04/16/2017   Procedure: TRANSESOPHAGEAL ECHOCARDIOGRAM (TEE);  Surgeon: [name], MD;  Location: [facility] ENDOSCOPY;  Service: Cardiovascular;  Laterality: N/A;  . UMBILICAL HERNIA REPAIR  1960s   "when I was little baby"     No Known Allergies  Immunization History  Administered Date(s) Administered  . Influenza Whole 06/02/2009  . Influenza,inj,Quad PF,6+ Mos 02/15/2013, 02/10/2015, 02/07/2017  . Pneumococcal Polysaccharide-23 12/13/2012  . Tdap 06/26/2015    Family History  Problem Relation Age of Onset  . Hypertension Mother   . Diabetes Brother   . Diabetes Sister   . Cancer Maternal Aunt   . Amblyopia Neg Hx   . Blindness Neg Hx   . Cataracts Neg Hx   . Glaucoma Neg Hx   . Macular degeneration Neg Hx   . Strabismus Neg Hx   . Retinal detachment Neg Hx   . Retinitis pigmentosa Neg Hx      Current Outpatient Medications:  .  albuterol (PROVENTIL HFA;VENTOLIN HFA) 108 (90 Base) MCG/ACT inhaler, Inhale 2 puffs into the lungs every 6 (six) hours as needed for wheezing or shortness of breath., Disp: 1 Inhaler, Rfl: 3 .  allopurinol (ZYLOPRIM) 100 MG tablet, Take 1 tablet (100 mg total) by mouth daily., Disp: 30 tablet, Rfl: 6 .  amLODipine (NORVASC) 10 MG tablet, Take 10 mg daily by mouth., Disp: , Rfl: 0 .  aspirin 325 MG tablet, Take 325 mg by mouth daily., Disp: , Rfl:  .  atorvastatin (LIPITOR) 40 MG tablet, Take 1 tablet (40 mg total) by mouth daily., Disp: 30 tablet, Rfl: 3 .  Blood Glucose Monitoring Suppl (ACCU-CHEK AVIVA) device, Use as instructed daily., Disp: 1 each, Rfl: 0 .  budesonide-formoterol (SYMBICORT) 160-4.5 MCG/ACT inhaler, Inhale 2 puffs into the lungs 2 (two) times daily., Disp: 1 Inhaler, Rfl: 3 .  bumetanide (BUMEX) 2 MG tablet, Take 1 tablet (2 mg total) by mouth 2 (two) times daily., Disp: 60 tablet, Rfl: 2 .  calcium carbonate (TUMS - DOSED IN MG ELEMENTAL CALCIUM) 500 MG chewable tablet, Chew 1 tablet (200 mg of elemental calcium total) by mouth 2 (two) times daily., Disp: , Rfl:  .  carvedilol (COREG) 6.25 MG tablet, Take 6.25 mg by mouth 2 (two) times daily., Disp: , Rfl: 0 .  glucose blood (ACCU-CHEK AVIVA) test strip, Use as instructed daily, Disp: 100 each,  Rfl: 12 .  hydrALAZINE (APRESOLINE) 50 MG tablet, Take 0.5 tablets (25 mg total) by mouth 3 (three) times daily., Disp: 90 tablet, Rfl: 5 .  isosorbide mononitrate (IMDUR) 30 MG 24 hr tablet, Take 1 tablet (30 mg total) by mouth daily., Disp: 30 tablet, Rfl: 5 .  Lancet Devices (ACCU-CHEK SOFTCLIX) lancets, Use as instructed daily., Disp: 1 each, Rfl: 5 .  lisinopril (PRINIVIL,ZESTRIL) 20 MG tablet, Take 20 mg by mouth daily., Disp: , Rfl: 0 .  nitroGLYCERIN (NITROSTAT) 0.4 MG SL tablet, Place 1 tablet (0.4 mg total) under the tongue every  5 (five) minutes x 3 doses as needed for chest pain., Disp: 25 tablet, Rfl: 1 .  OXYGEN, Inhale 4 L into the lungs daily., Disp: , Rfl:  .  prednisoLONE acetate (PRED FORTE) 1 % ophthalmic suspension, Place 1 drop into the right eye 4 (four) times daily for 7 days., Disp: 10 mL, Rfl: 0 .  TRAVATAN Z 0.004 % SOLN ophthalmic solution, Place 1 drop into both eyes at bedtime., Disp: , Rfl: 12   Review of Systems  Constitutional: Negative for fever and unexpected weight change.  HENT: Negative for congestion, dental problem, ear pain, nosebleeds, postnasal drip, rhinorrhea, sinus pressure, sneezing, sore throat and trouble swallowing.   Eyes: Negative for redness and itching.  Respiratory: Negative for cough, chest tightness, shortness of breath and wheezing.   Cardiovascular: Negative for palpitations.  Gastrointestinal: Negative for nausea and vomiting.  Genitourinary: Negative for dysuria.  Musculoskeletal: Negative for joint swelling.  Skin: Negative for rash.  Allergic/Immunologic: Negative.  Negative for environmental allergies, food allergies and immunocompromised state.  Neurological: Negative for headaches.  Hematological: Does not bruise/bleed easily.  Psychiatric/Behavioral: Negative for dysphoric mood. The patient is not nervous/anxious.        Objective:   Physical Exam  Constitutional: He is oriented to person, place, and time. He appears  well-developed and well-nourished. No distress.  HENT:  Head: Normocephalic and atraumatic.  Right Ear: External ear normal.  Left Ear: External ear normal.  Mouth/Throat: Oropharynx is clear and moist. No oropharyngeal exudate.  Eyes: Conjunctivae and EOM are normal. Pupils are equal, round, and reactive to light. Right eye exhibits no discharge. Left eye exhibits no discharge. No scleral icterus.  Neck: Normal range of motion. Neck supple. No JVD present. No tracheal deviation present. No thyromegaly present.  Cardiovascular: Normal rate, regular rhythm and intact distal pulses. Exam reveals no gallop and no friction rub.  No murmur heard. Pulmonary/Chest: Effort normal and breath sounds normal. No respiratory distress. He has no wheezes. He has no rales. He exhibits no tenderness.  Abdominal: Soft. Bowel sounds are normal. He exhibits no distension and no mass. There is no tenderness. There is no rebound and no guarding.  Morbid visceral obesity +  Musculoskeletal: Normal range of motion. He exhibits no edema or tenderness.  Chronic edemaa  Lymphadenopathy:    He has no cervical adenopathy.  Neurological: He is alert and oriented to person, place, and time. He has normal reflexes. No cranial nerve deficit. Coordination normal.  Skin: Skin is warm and dry. No rash noted. He is not diaphoretic. No erythema. No pallor.  Psychiatric: He has a normal mood and affect. His behavior is normal. Judgment and thought content normal.  Poor historian  Nursing note and vitals reviewed.   Vitals:   04/28/17 1124  BP: 120/70  Pulse: 68  SpO2: 100%  Weight: (!) 326 lb 6.4 oz (148.1 kg)  Height: 6' (1.829 m)   Estimated body mass index is 44.27 kg/m as calculated from the following:   Height as of this encounter: 6' (1.829 m).   Weight as of this encounter: 326 lb 6.4 oz (148.1 kg).       Assessment & Plan:     ICD-10-CM   1. Morbid obesity with alveolar hypoventilation (HCC) E66.2   2.  OSA (obstructive sleep apnea) G47.33   3. Chronic respiratory failure with hypoxia and hypercapnia (HCC) J96.11    J96.12   4. Chronic kidney disease, unspecified CKD stage N18.9   5. Anemia,  chronic disease D63.8   6. Chronic combined systolic and diastolic heart failure (HCC) I50.42     Glad you are better after recent November hospitalization Your problems are sum total of all of the above Doubt you have copd/asthma  Plan - recheck post hospital ABG,  Cbc and bmet; will call with results - refer sleep doc in our office - continue o2 and other meds and bipap at night  Followup  - to sleep doc in our office next few to several weeks    Dr. Brand Males, M.D., [facility].C.P Pulmonary and Critical Care Medicine Staff Physician, [place] Director - Interstitial Lung Disease  Program  Pulmonary [place] [facility] [place], [place], [address]  Pager: [phone], If no answer or between  15:00h - 7:00h: call [phone] Telephone: [phone]

## 2017-04-28 NOTE — Patient Instructions (Addendum)
ICD-10-CM   1. Morbid obesity with alveolar hypoventilation (HCC) E66.2   2. OSA (obstructive sleep apnea) G47.33   3. Chronic respiratory failure with hypoxia and hypercapnia (HCC) J96.11    J96.12   4. Chronic kidney disease, unspecified CKD stage N18.9   5. Anemia, chronic disease D63.8   6. Chronic combined systolic and diastolic heart failure (HCC) I50.42     Glad you are better after recent November hospitalization Your problems are sum total of all of the above Doubt you have copd/asthma  Plan - recheck post hospital ABG on RA,  Cbc and bmet; will call with results - refer sleep doc in our office - continue o2 2L [place] with exertion and at night as before and other meds and bipap at night  Followup  - to sleep doc in our office next few to several weeks

## 2017-04-29 ENCOUNTER — Ambulatory Visit (HOSPITAL_COMMUNITY)
Admission: RE | Admit: 2017-04-29 | Discharge: 2017-04-29 | Disposition: A | Discharge status: Home / Self Care | Payer: Medicare Other | Source: Ambulatory Visit | Attending: Internal Medicine | Admitting: Internal Medicine

## 2017-04-29 ENCOUNTER — Telehealth: Payer: Self-pay

## 2017-04-29 DIAGNOSIS — E662 Morbid (severe) obesity with alveolar hypoventilation: Secondary | ICD-10-CM | POA: Insufficient documentation

## 2017-04-29 LAB — BLOOD GAS, ARTERIAL
ACID-BASE EXCESS: 3.3 mmol/L — AB (ref 0.0–2.0)
Bicarbonate: 27.6 mmol/L (ref 20.0–28.0)
Drawn by: 270521
FIO2: 21
O2 SAT: 88.9 %
PATIENT TEMPERATURE: 98.6
PCO2 ART: 45.2 mmHg (ref 32.0–48.0)
PO2 ART: 61.4 mmHg — AB (ref 83.0–108.0)
pH, Arterial: 7.404 (ref 7.350–7.450)

## 2017-04-29 NOTE — Telephone Encounter (Signed)
Pt was called and informed of lab results. 

## 2017-05-01 ENCOUNTER — Telehealth: Payer: Self-pay | Admitting: Family Medicine

## 2017-05-01 DIAGNOSIS — E1122 Type 2 diabetes mellitus with diabetic chronic kidney disease: Secondary | ICD-10-CM | POA: Diagnosis not present

## 2017-05-01 DIAGNOSIS — N184 Chronic kidney disease, stage 4 (severe): Secondary | ICD-10-CM | POA: Diagnosis not present

## 2017-05-01 DIAGNOSIS — I130 Hypertensive heart and chronic kidney disease with heart failure and stage 1 through stage 4 chronic kidney disease, or unspecified chronic kidney disease: Secondary | ICD-10-CM | POA: Diagnosis not present

## 2017-05-01 DIAGNOSIS — J449 Chronic obstructive pulmonary disease, unspecified: Secondary | ICD-10-CM | POA: Diagnosis not present

## 2017-05-01 DIAGNOSIS — J9621 Acute and chronic respiratory failure with hypoxia: Secondary | ICD-10-CM | POA: Diagnosis not present

## 2017-05-01 DIAGNOSIS — I5043 Acute on chronic combined systolic (congestive) and diastolic (congestive) heart failure: Secondary | ICD-10-CM | POA: Diagnosis not present

## 2017-05-01 NOTE — Telephone Encounter (Signed)
Call placed to patient [phone] to check on his status. Spoke with patient and he informed me that he's doing good and taking medication. He is also using his Bipap machine. Home health nurse had just visited patient. Patient stated that he has reached out to Medicaid in regards to transportation.

## 2017-05-02 DIAGNOSIS — I129 Hypertensive chronic kidney disease with stage 1 through stage 4 chronic kidney disease, or unspecified chronic kidney disease: Secondary | ICD-10-CM | POA: Diagnosis not present

## 2017-05-02 DIAGNOSIS — G4733 Obstructive sleep apnea (adult) (pediatric): Secondary | ICD-10-CM | POA: Diagnosis not present

## 2017-05-02 DIAGNOSIS — M109 Gout, unspecified: Secondary | ICD-10-CM | POA: Diagnosis not present

## 2017-05-02 DIAGNOSIS — N189 Chronic kidney disease, unspecified: Secondary | ICD-10-CM | POA: Diagnosis not present

## 2017-05-02 DIAGNOSIS — E785 Hyperlipidemia, unspecified: Secondary | ICD-10-CM | POA: Diagnosis not present

## 2017-05-02 DIAGNOSIS — J449 Chronic obstructive pulmonary disease, unspecified: Secondary | ICD-10-CM | POA: Diagnosis not present

## 2017-05-02 NOTE — Progress Notes (Deleted)
[facility] Note  05/06/2017     CHIEF COMPLAINT Patient presents for No chief complaint on file.   HISTORY OF PRESENT ILLNESS: Adam Dennis is a 56 y.o. male who presents to the clinic today for:   Pt states that he was seen by Dr. [name] for glaucoma follow up;   Referring physician: [name], MD [place], [place] [address]  HISTORICAL INFORMATION:   Selected notes from the [place] Referred by Dr. [name] for RT OD LEE- 11.26.18 (S. [place]) Ocular Hx- glaucoma OU (on travatan OU qd) PMH- DM, COPD, CHF, kidney disease    CURRENT MEDICATIONS: Current Outpatient Medications (Ophthalmic Drugs)  Medication Sig   TRAVATAN Z 0.004 % SOLN ophthalmic solution Place 1 drop into both eyes at bedtime.   No current facility-administered medications for this visit.  (Ophthalmic Drugs)   Current Outpatient Medications (Other)  Medication Sig   albuterol (PROVENTIL HFA;VENTOLIN HFA) 108 (90 Base) MCG/ACT inhaler Inhale 2 puffs into the lungs every 6 (six) hours as needed for wheezing or shortness of breath.   allopurinol (ZYLOPRIM) 100 MG tablet Take 1 tablet (100 mg total) by mouth daily.   amLODipine (NORVASC) 10 MG tablet Take 10 mg daily by mouth.   aspirin 325 MG tablet Take 325 mg by mouth daily.   atorvastatin (LIPITOR) 40 MG tablet Take 1 tablet (40 mg total) by mouth daily.   Blood Glucose Monitoring Suppl (ACCU-CHEK AVIVA) device Use as instructed daily.   budesonide-formoterol (SYMBICORT) 160-4.5 MCG/ACT inhaler Inhale 2 puffs into the lungs 2 (two) times daily.   bumetanide (BUMEX) 2 MG tablet Take 1 tablet (2 mg total) by mouth 2 (two) times daily.   calcium carbonate (TUMS - DOSED IN MG ELEMENTAL CALCIUM) 500 MG chewable tablet Chew 1 tablet (200 mg of elemental calcium total) by mouth 2 (two) times daily.   carvedilol (COREG) 6.25 MG tablet Take 6.25 mg by mouth 2 (two) times daily.    glucose blood (ACCU-CHEK AVIVA) test strip Use as instructed daily   hydrALAZINE (APRESOLINE) 50 MG tablet Take 0.5 tablets (25 mg total) by mouth 3 (three) times daily.   isosorbide mononitrate (IMDUR) 30 MG 24 hr tablet Take 1 tablet (30 mg total) by mouth daily.   Lancet Devices (ACCU-CHEK SOFTCLIX) lancets Use as instructed daily.   lisinopril (PRINIVIL,ZESTRIL) 20 MG tablet Take 20 mg by mouth daily.   nitroGLYCERIN (NITROSTAT) 0.4 MG SL tablet Place 1 tablet (0.4 mg total) under the tongue every 5 (five) minutes x 3 doses as needed for chest pain.   OXYGEN Inhale 4 L into the lungs daily.   No current facility-administered medications for this visit.  (Other)      REVIEW OF SYSTEMS:    ALLERGIES No Known Allergies  PAST MEDICAL HISTORY Past Medical History:  Diagnosis Date   Arthritis    "back" (02/06/2017)   CHF (congestive heart failure) ([place])    new onset /Encounter Date 09/12/2006   Chronic combined systolic and diastolic heart failure (HCC)    CKD (chronic kidney disease) stage 4, GFR 15-29 ml/min (HCC)    Glaucoma, right eye    High cholesterol    Hypertension    Hypertrophy of tonsils alone    Myocardial infarction ([place])    "small one; ~ 2008" (02/06/2017)   Obesity, unspecified    On home oxygen therapy    "4L; 24/7" (04/10/2017)   OSA on CPAP  Type II diabetes mellitus ([place])    Past Surgical History:  Procedure Laterality Date   HERNIA REPAIR     TEE WITHOUT CARDIOVERSION N/A 04/16/2017   Procedure: TRANSESOPHAGEAL ECHOCARDIOGRAM (TEE);  Surgeon: [name], MD;  Location: [facility] ENDOSCOPY;  Service: Cardiovascular;  Laterality: N/A;   UMBILICAL HERNIA REPAIR  1960s   "when I was little baby"    FAMILY HISTORY Family History  Problem Relation Age of Onset   Hypertension Mother    Diabetes Brother    Diabetes Sister    Cancer Maternal Aunt    Amblyopia Neg Hx    Blindness Neg Hx    Cataracts Neg Hx    Glaucoma  Neg Hx    Macular degeneration Neg Hx    Strabismus Neg Hx    Retinal detachment Neg Hx    Retinitis pigmentosa Neg Hx     SOCIAL HISTORY Social History   Tobacco Use   Smoking status: Never Smoker   Smokeless tobacco: Never Used  Substance Use Topics   Alcohol use: No   Drug use: No         OPHTHALMIC EXAM:   Not recorded      IMAGING AND PROCEDURES  Imaging and Procedures for 05/02/17           ASSESSMENT/PLAN:    ICD-10-CM   1. Retinal breaks without detachment H35.89 OCT, Retina - OU - Both Eyes  2. Lattice degeneration of both retinas H35.413   3. Retinal edema H35.81   4. Hypertensive retinopathy of both eyes H35.033   5. Moderate nonproliferative diabetic retinopathy of both eyes without macular edema associated with type 2 diabetes mellitus ([place]) C62.3762   6. Combined form of age-related cataract, both eyes H25.813     1,2. Lattice degeneration with atrophic holes/breaks OU - discussed findings, prognosis and treatment options including laser retinopexy vs observation - recommend laser retinopexy OU - S/P laser retinopexy OD (11.26.18) - pt wishes to proceed with laser retinopexy OS *** - RBA of procedure discussed, questions answered - informed consent obtained and signed - see procedure note - start PF QID OS x7 days - Follow-up 2-3 wks for possible laser retinopexy OS  3. No retinal edema  4. Hypertensive retinopathy OU - discussed importance of tight BP control - monitor  5. Moderate non-proliferative diabetic retinopathy w/o DME, both eyes - The incidence, risk factors for progression, natural history and treatment options for diabetic retinopathy were discussed with patient.   - The need for close monitoring of blood glucose, blood pressure, and serum lipids, avoiding cigarette or any type of tobacco, and the need for long term follow up was also discussed with patient. - exam with a few peripheral IRHs OU - will check FA at  future visit for full assessment - OCT without diabetic macular edema, both eyes  - monitor  6. Combined forms of age related cataracts OU-  - The symptoms of cataract, surgical options, and treatments and risks were discussed with patient. - discussed diagnosis and progression - not yet visually significant - monitor for now  Ophthalmic Meds Ordered this visit:  No orders of the defined types were placed in this encounter.     No Follow-up on file.  There are no Patient Instructions on file for this visit.   Explained the diagnoses, plan, and follow up with the patient and they expressed understanding.  Patient expressed understanding of the importance of proper follow up care.   This document serves  as a record of services personally performed by Gardiner Sleeper, MD, PhD. It was created on their behalf by Catha Brow, [place], a certified ophthalmic assistant. The creation of this record is the provider's dictation and/or activities during the visit.  Electronically signed by: Catha Brow, [place]  05/02/17 4:52 PM   Gardiner Sleeper, M.D., Ph.D. Diseases & Surgery of the Retina and [place] 05/02/17     Abbreviations: M myopia (nearsighted); A astigmatism; H hyperopia (farsighted); P presbyopia; Mrx spectacle prescription;  CTL contact lenses; OD right eye; OS left eye; OU both eyes  XT exotropia; ET esotropia; PEK punctate epithelial keratitis; PEE punctate epithelial erosions; DES dry eye syndrome; MGD meibomian gland dysfunction; ATs artificial tears; PFAT's preservative free artificial tears; [place] nuclear sclerotic cataract; PSC posterior subcapsular cataract; ERM epi-retinal membrane; PVD posterior vitreous detachment; RD retinal detachment; DM diabetes mellitus; DR diabetic retinopathy; NPDR non-proliferative diabetic retinopathy; PDR proliferative diabetic retinopathy; CSME clinically significant macular edema; DME diabetic macular edema;  dbh dot blot hemorrhages; CWS cotton wool spot; POAG primary open angle glaucoma; C/D cup-to-disc ratio; HVF humphrey visual field; GVF goldmann visual field; OCT optical coherence tomography; IOP intraocular pressure; BRVO Branch retinal vein occlusion; CRVO central retinal vein occlusion; CRAO central retinal artery occlusion; BRAO branch retinal artery occlusion; RT retinal tear; SB scleral buckle; PPV pars plana vitrectomy; VH Vitreous hemorrhage; PRP panretinal laser photocoagulation; IVK intravitreal kenalog; VMT vitreomacular traction; MH Macular hole;  NVD neovascularization of the disc; NVE neovascularization elsewhere; AREDS age related eye disease study; ARMD age related macular degeneration; POAG primary open angle glaucoma; EBMD epithelial/anterior basement membrane dystrophy; ACIOL anterior chamber intraocular lens; IOL intraocular lens; PCIOL posterior chamber intraocular lens; Phaco/IOL phacoemulsification with intraocular lens placement; [place] photorefractive keratectomy; LASIK laser assisted in situ keratomileusis; HTN hypertension; DM diabetes mellitus; COPD chronic obstructive pulmonary disease

## 2017-05-06 ENCOUNTER — Encounter (INDEPENDENT_AMBULATORY_CARE_PROVIDER_SITE_OTHER): Payer: Medicaid Other | Admitting: Ophthalmology

## 2017-05-06 DIAGNOSIS — J9621 Acute and chronic respiratory failure with hypoxia: Secondary | ICD-10-CM | POA: Diagnosis not present

## 2017-05-06 DIAGNOSIS — I130 Hypertensive heart and chronic kidney disease with heart failure and stage 1 through stage 4 chronic kidney disease, or unspecified chronic kidney disease: Secondary | ICD-10-CM | POA: Diagnosis not present

## 2017-05-06 DIAGNOSIS — J449 Chronic obstructive pulmonary disease, unspecified: Secondary | ICD-10-CM | POA: Diagnosis not present

## 2017-05-06 DIAGNOSIS — I5043 Acute on chronic combined systolic (congestive) and diastolic (congestive) heart failure: Secondary | ICD-10-CM | POA: Diagnosis not present

## 2017-05-06 DIAGNOSIS — N184 Chronic kidney disease, stage 4 (severe): Secondary | ICD-10-CM | POA: Diagnosis not present

## 2017-05-06 DIAGNOSIS — E1122 Type 2 diabetes mellitus with diabetic chronic kidney disease: Secondary | ICD-10-CM | POA: Diagnosis not present

## 2017-05-08 NOTE — Addendum Note (Signed)
Addended by: [name] on: 05/08/2017 08:42 AM   Modules accepted: Level of Service

## 2017-05-08 NOTE — Progress Notes (Signed)
[facility] Note  05/09/2017     CHIEF COMPLAINT Patient presents for Diabetic Eye Exam and Retina Follow Up   HISTORY OF PRESENT ILLNESS: Adam Dennis is a 56 y.o. male who presents to the clinic today for:   HPI    Diabetic Eye Exam    Vision is stable.  Associated Symptoms Negative for Blind Spot, Glare, Shoulder/Hip pain, Fatigue, Jaw Claudication, Photophobia, Distortion, Floaters, Redness, Scalp Tenderness, Weight Loss, Fever, Trauma, Pain and Flashes.  Diabetes characteristics include Type 2.  This started years ago.  Blood sugar level is controlled.  Last Blood Glucose 86.  Associated Diagnosis Kidney Disease.          Retina Follow Up    Patient presents with  Retinal Break/Detachment.  In right eye.  Severity is mild.  Since onset it is gradually improving.  I, the attending physician,  performed the HPI with the patient and updated documentation appropriately.          Comments    F/U Lattice degen. W/atrophic holes OU S/P laser retinopexy OD 04/21/17. Patient states his vision is getting better, Denies floaters, flashes and pain. Bs 86 this am A1C unknown . Pt uses Travatan @Hs  OU. Denies vits       Last edited by Bernarda Caffey, MD on 05/09/2017 11:39 AM. (History)      Referring physician: [name], MD [place], [place] [address]  HISTORICAL INFORMATION:   Selected notes from the MEDICAL RECORD NUMBER Referred by Dr. [name] for RT OD LEE- 11.26.18 ([place]. [place]) Ocular Hx- glaucoma OU (on travatan OU qd) PMH- DM, COPD, CHF, kidney disease    CURRENT MEDICATIONS: Current Outpatient Medications (Ophthalmic Drugs)  Medication Sig  . TRAVATAN Z 0.004 % SOLN ophthalmic solution Place 1 drop into both eyes at bedtime.   No current facility-administered medications for this visit.  (Ophthalmic Drugs)   Current Outpatient Medications (Other)  Medication Sig  . albuterol (PROVENTIL HFA;VENTOLIN HFA) 108 (90  Base) MCG/ACT inhaler Inhale 2 puffs into the lungs every 6 (six) hours as needed for wheezing or shortness of breath.  . allopurinol (ZYLOPRIM) 100 MG tablet Take 1 tablet (100 mg total) by mouth daily.  Marland Kitchen amLODipine (NORVASC) 10 MG tablet Take 10 mg daily by mouth.  Marland Kitchen aspirin 325 MG tablet Take 325 mg by mouth daily.  Marland Kitchen atorvastatin (LIPITOR) 40 MG tablet Take 1 tablet (40 mg total) by mouth daily.  . Blood Glucose Monitoring Suppl (ACCU-CHEK AVIVA) device Use as instructed daily.  . budesonide-formoterol (SYMBICORT) 160-4.5 MCG/ACT inhaler Inhale 2 puffs into the lungs 2 (two) times daily.  . bumetanide (BUMEX) 2 MG tablet Take 1 tablet (2 mg total) by mouth 2 (two) times daily.  . calcium carbonate (TUMS - DOSED IN MG ELEMENTAL CALCIUM) 500 MG chewable tablet Chew 1 tablet (200 mg of elemental calcium total) by mouth 2 (two) times daily.  . carvedilol (COREG) 6.25 MG tablet Take 6.25 mg by mouth 2 (two) times daily.  Marland Kitchen glucose blood (ACCU-CHEK AVIVA) test strip Use as instructed daily  . hydrALAZINE (APRESOLINE) 50 MG tablet Take 0.5 tablets (25 mg total) by mouth 3 (three) times daily.  . isosorbide mononitrate (IMDUR) 30 MG 24 hr tablet Take 1 tablet (30 mg total) by mouth daily.  Elmore Guise Devices (ACCU-CHEK SOFTCLIX) lancets Use as instructed daily.  Marland Kitchen lisinopril (PRINIVIL,ZESTRIL) 20 MG tablet Take 20 mg by mouth daily.  . nitroGLYCERIN (  NITROSTAT) 0.4 MG SL tablet Place 1 tablet (0.4 mg total) under the tongue every 5 (five) minutes x 3 doses as needed for chest pain.  . OXYGEN Inhale 4 L into the lungs daily.   No current facility-administered medications for this visit.  (Other)      REVIEW OF SYSTEMS: ROS    Positive for: Skin, Musculoskeletal, Endocrine, Cardiovascular, Eyes, Respiratory, Psychiatric, Allergic/Imm, Heme/Lymph   Negative for: Constitutional, Gastrointestinal, Neurological, Genitourinary, HENT   Last edited by Zenovia Jordan, LPN on 16/02/9603  5:40 AM.  (History)       ALLERGIES No Known Allergies  PAST MEDICAL HISTORY Past Medical History:  Diagnosis Date  . Arthritis    "back" (02/06/2017)  . CHF (congestive heart failure) ([place])    new onset /Encounter Date 09/12/2006  . Chronic combined systolic and diastolic heart failure ([place])   . CKD (chronic kidney disease) stage 4, GFR 15-29 ml/min (HCC)   . Glaucoma, right eye   . High cholesterol   . Hypertension   . Hypertrophy of tonsils alone   . Myocardial infarction [facility])    "small one; ~ 2008" (02/06/2017)  . Obesity, unspecified   . On home oxygen therapy    "4L; 24/7" (04/10/2017)  . OSA on CPAP   . Type II diabetes mellitus ([place])    Past Surgical History:  Procedure Laterality Date  . HERNIA REPAIR    . TEE WITHOUT CARDIOVERSION N/A 04/16/2017   Procedure: TRANSESOPHAGEAL ECHOCARDIOGRAM (TEE);  Surgeon: [name], MD;  Location: [facility] ENDOSCOPY;  Service: Cardiovascular;  Laterality: N/A;  . UMBILICAL HERNIA REPAIR  1960s   "when I was little baby"    FAMILY HISTORY Family History  Problem Relation Age of Onset  . Hypertension Mother   . Diabetes Brother   . Diabetes Sister   . Cancer Maternal Aunt   . Amblyopia Neg Hx   . Blindness Neg Hx   . Cataracts Neg Hx   . Glaucoma Neg Hx   . Macular degeneration Neg Hx   . Strabismus Neg Hx   . Retinal detachment Neg Hx   . Retinitis pigmentosa Neg Hx     SOCIAL HISTORY Social History   Tobacco Use  . Smoking status: Never Smoker  . Smokeless tobacco: Never Used  Substance Use Topics  . Alcohol use: No  . Drug use: No         OPHTHALMIC EXAM:  Base Eye Exam    Visual Acuity (Snellen - Linear)      Right Left   Dist [place] 20/25 20/25   Dist ph [place] 20/20 20/20       Tonometry (Tonopen, 9:21 AM)      Right Left   Pressure 20 20       Pupils      Dark Shape APD   Right 7 Round None   Left 7 Round None       Visual Fields (Counting fingers)      Left Right    Full Full       Extraocular  Movement      Right Left    Full, Ortho Full, Ortho       Neuro/Psych    Oriented x3:  Yes   Mood/Affect:  Normal       Dilation    Both eyes:  1.0% Mydriacyl, 2.5% Phenylephrine @ 9:21 AM        Slit Lamp and Fundus Exam    External Exam  Right Left   External Normal Normal       Slit Lamp Exam      Right Left   Lids/Lashes Dermatochalasis - upper lid Dermatochalasis - upper lid   Conjunctiva/Sclera melanosis melanosis   Cornea Clear with mild arcus Clear with mild arcus   Anterior Chamber Deep and quiet Deep and quiet   Iris Round and dilated, No NVI Round and dilated, No NVI   Lens 2+ Nuclear sclerosis, 2+ Cortical cataract 2+ Nuclear sclerosis, 2+ Cortical cataract   Vitreous mild Vitreous syneresis mild Vitreous syneresis       Fundus Exam      Right Left   Disc Normal Normal   C/D Ratio 0.1 0.3   Macula Flat, good foveal reflex, no heme or edema Flat, good foveal reflex, no heme or edema   Vessels copper wiring, Tortuous, dilated copper wiring, Tortuous, dilated   Periphery Two Inferior-temporal patches of pigmented lattice degeneration with retinal breaks - one with irregular break at 0730 no SRF -- good laser surrounding, patch of lattice at 1030 with atrophic holes -- good laser surrounding,  temporal blot hemorrhages,  Attached, 2 disc diameter resolving subretinal hemorrhage at 0130 -- clearing, Inferior-temporal Lattice degeneration - patches from 0300 to 0600 with atrophic holes          IMAGING AND PROCEDURES  Imaging and Procedures for 05/09/17  Repair Retinal Breaks, Laser - OS - Left Eye     LASER PROCEDURE NOTE  Procedure:  Barrier laser retinopexy using slit lamp laser, LEFT eye   Diagnosis:   Lattice degeneration w/ atrophic holes, LEFT eye                     Patches of lattice with atrophic holes in inf temporal quadrant, LEFT eye   Surgeon: Bernarda Caffey, MD, PhD  Anesthesia: Topical  Informed consent obtained, operative eye  marked, and time out performed prior to initiation of laser.   Laser settings:  Lumenis [id number] laser, slit lamp Lens: [place] PRP 165 Power: 220 mW Spot size: 200 microns Duration: 50 msec  # spots: 489  Placement of laser: Using a [place] PRP 165 contact lens at the slit lamp, laser was placed in three confluent rows around patches of lattice in inferotemporal quadrant.  Complications: None.  Patient tolerated the procedure well and received written and verbal post-procedure care information/education.                  ASSESSMENT/PLAN:    ICD-10-CM   1. Retinal breaks without detachment H35.89 Repair Retinal Breaks, Laser - OS - Left Eye  2. Lattice degeneration of both retinas H35.413 Repair Retinal Breaks, Laser - OS - Left Eye  3. Retinal edema H35.81   4. Hypertensive retinopathy of both eyes H35.033   5. Moderate nonproliferative diabetic retinopathy of both eyes without macular edema associated with type 2 diabetes mellitus ([place]) Z76.7341   6. Combined form of age-related cataract, both eyes H25.813     1,2. Lattice degeneration with atrophic holes/breaks OU - discussed findings, prognosis and treatment options including laser retinopexy vs observation - recommend laser retinopexy OU - S/P laser retinopexy OD (11.26.18) - laser OD looks good - pt wishes to proceed with laser retinopexy OS today (12.14.18) - RBA of procedure discussed, questions answered - informed consent obtained and signed - see procedure note - start PF QID OS x7 days - Follow-up 2-3 wks  3. No retinal edema  4. Hypertensive retinopathy  OU - discussed importance of tight BP control - monitor  5. Moderate non-proliferative diabetic retinopathy w/o DME, both eyes - The incidence, risk factors for progression, natural history and treatment options for diabetic retinopathy were discussed with patient.   - The need for close monitoring of blood glucose, blood pressure, and serum  lipids, avoiding cigarette or any type of tobacco, and the need for long term follow up was also discussed with patient. - exam with a few peripheral IRHs OU - will check FA at next visit for full assessment - OCT without diabetic macular edema, both eyes  - monitor  6. Combined forms of age related cataracts OU-  - The symptoms of cataract, surgical options, and treatments and risks were discussed with patient. - discussed diagnosis and progression - not yet visually significant - monitor for now  Ophthalmic Meds Ordered this visit:  No orders of the defined types were placed in this encounter.     Return for 2-3 wks, POV.  There are no Patient Instructions on file for this visit.   Explained the diagnoses, plan, and follow up with the patient and they expressed understanding.  Patient expressed understanding of the importance of proper follow up care.   This document serves as a record of services personally performed by Gardiner Sleeper, MD, PhD. It was created on their behalf by Catha Brow, [place], a certified ophthalmic assistant. The creation of this record is the provider's dictation and/or activities during the visit.  Electronically signed by: Catha Brow, COA  05/09/17 11:48 AM   Gardiner Sleeper, M.D., Ph.D. Diseases & Surgery of the Retina and [place] 05/09/17  I have reviewed the above documentation for accuracy and completeness, and I agree with the above. Gardiner Sleeper, M.D., Ph.D. 05/09/17 11:48 AM     Abbreviations: M myopia (nearsighted); A astigmatism; H hyperopia (farsighted); P presbyopia; Mrx spectacle prescription;  CTL contact lenses; OD right eye; OS left eye; OU both eyes  XT exotropia; ET esotropia; PEK punctate epithelial keratitis; PEE punctate epithelial erosions; DES dry eye syndrome; MGD meibomian gland dysfunction; ATs artificial tears; PFAT's preservative free artificial tears; [place] nuclear sclerotic  cataract; PSC posterior subcapsular cataract; ERM epi-retinal membrane; PVD posterior vitreous detachment; RD retinal detachment; DM diabetes mellitus; DR diabetic retinopathy; NPDR non-proliferative diabetic retinopathy; PDR proliferative diabetic retinopathy; CSME clinically significant macular edema; DME diabetic macular edema; dbh dot blot hemorrhages; CWS cotton wool spot; POAG primary open angle glaucoma; C/D cup-to-disc ratio; HVF humphrey visual field; GVF goldmann visual field; OCT optical coherence tomography; IOP intraocular pressure; BRVO Branch retinal vein occlusion; CRVO central retinal vein occlusion; CRAO central retinal artery occlusion; BRAO branch retinal artery occlusion; RT retinal tear; SB scleral buckle; PPV pars plana vitrectomy; VH Vitreous hemorrhage; PRP panretinal laser photocoagulation; IVK intravitreal kenalog; VMT vitreomacular traction; MH Macular hole;  NVD neovascularization of the disc; NVE neovascularization elsewhere; AREDS age related eye disease study; ARMD age related macular degeneration; POAG primary open angle glaucoma; EBMD epithelial/anterior basement membrane dystrophy; ACIOL anterior chamber intraocular lens; IOL intraocular lens; PCIOL posterior chamber intraocular lens; Phaco/IOL phacoemulsification with intraocular lens placement; [place] photorefractive keratectomy; LASIK laser assisted in situ keratomileusis; HTN hypertension; DM diabetes mellitus; COPD chronic obstructive pulmonary disease

## 2017-05-09 ENCOUNTER — Ambulatory Visit (INDEPENDENT_AMBULATORY_CARE_PROVIDER_SITE_OTHER): Payer: Medicare Other | Admitting: Ophthalmology

## 2017-05-09 ENCOUNTER — Encounter (INDEPENDENT_AMBULATORY_CARE_PROVIDER_SITE_OTHER): Payer: Self-pay | Admitting: Ophthalmology

## 2017-05-09 DIAGNOSIS — H35413 Lattice degeneration of retina, bilateral: Secondary | ICD-10-CM | POA: Diagnosis not present

## 2017-05-09 DIAGNOSIS — H3581 Retinal edema: Secondary | ICD-10-CM

## 2017-05-09 DIAGNOSIS — H3589 Other specified retinal disorders: Secondary | ICD-10-CM

## 2017-05-09 DIAGNOSIS — H35033 Hypertensive retinopathy, bilateral: Secondary | ICD-10-CM

## 2017-05-09 DIAGNOSIS — E113393 Type 2 diabetes mellitus with moderate nonproliferative diabetic retinopathy without macular edema, bilateral: Secondary | ICD-10-CM

## 2017-05-09 DIAGNOSIS — H25813 Combined forms of age-related cataract, bilateral: Secondary | ICD-10-CM

## 2017-05-14 ENCOUNTER — Encounter: Payer: Self-pay | Admitting: Family Medicine

## 2017-05-14 ENCOUNTER — Ambulatory Visit: Payer: Medicare Other | Attending: Family Medicine | Admitting: Family Medicine

## 2017-05-14 VITALS — BP 115/71 | HR 59 | Temp 97.4°F | Ht 72.0 in | Wt 325.0 lb

## 2017-05-14 DIAGNOSIS — J438 Other emphysema: Secondary | ICD-10-CM | POA: Diagnosis not present

## 2017-05-14 DIAGNOSIS — I252 Old myocardial infarction: Secondary | ICD-10-CM | POA: Insufficient documentation

## 2017-05-14 DIAGNOSIS — G4733 Obstructive sleep apnea (adult) (pediatric): Secondary | ICD-10-CM | POA: Diagnosis not present

## 2017-05-14 DIAGNOSIS — Z7951 Long term (current) use of inhaled steroids: Secondary | ICD-10-CM | POA: Diagnosis not present

## 2017-05-14 DIAGNOSIS — M1A00X0 Idiopathic chronic gout, unspecified site, without tophus (tophi): Secondary | ICD-10-CM | POA: Diagnosis not present

## 2017-05-14 DIAGNOSIS — I509 Heart failure, unspecified: Secondary | ICD-10-CM | POA: Diagnosis present

## 2017-05-14 DIAGNOSIS — H409 Unspecified glaucoma: Secondary | ICD-10-CM | POA: Insufficient documentation

## 2017-05-14 DIAGNOSIS — M109 Gout, unspecified: Secondary | ICD-10-CM | POA: Insufficient documentation

## 2017-05-14 DIAGNOSIS — M1990 Unspecified osteoarthritis, unspecified site: Secondary | ICD-10-CM | POA: Diagnosis not present

## 2017-05-14 DIAGNOSIS — Z79899 Other long term (current) drug therapy: Secondary | ICD-10-CM | POA: Insufficient documentation

## 2017-05-14 DIAGNOSIS — Z6841 Body Mass Index (BMI) 40.0 and over, adult: Secondary | ICD-10-CM | POA: Diagnosis not present

## 2017-05-14 DIAGNOSIS — E1122 Type 2 diabetes mellitus with diabetic chronic kidney disease: Secondary | ICD-10-CM | POA: Insufficient documentation

## 2017-05-14 DIAGNOSIS — N184 Chronic kidney disease, stage 4 (severe): Secondary | ICD-10-CM | POA: Diagnosis not present

## 2017-05-14 DIAGNOSIS — I5042 Chronic combined systolic (congestive) and diastolic (congestive) heart failure: Secondary | ICD-10-CM | POA: Diagnosis not present

## 2017-05-14 DIAGNOSIS — Z9981 Dependence on supplemental oxygen: Secondary | ICD-10-CM | POA: Diagnosis not present

## 2017-05-14 DIAGNOSIS — E1169 Type 2 diabetes mellitus with other specified complication: Secondary | ICD-10-CM | POA: Diagnosis not present

## 2017-05-14 DIAGNOSIS — J9612 Chronic respiratory failure with hypercapnia: Secondary | ICD-10-CM

## 2017-05-14 DIAGNOSIS — Z7982 Long term (current) use of aspirin: Secondary | ICD-10-CM | POA: Insufficient documentation

## 2017-05-14 DIAGNOSIS — J9611 Chronic respiratory failure with hypoxia: Secondary | ICD-10-CM | POA: Diagnosis not present

## 2017-05-14 DIAGNOSIS — E7800 Pure hypercholesterolemia, unspecified: Secondary | ICD-10-CM | POA: Insufficient documentation

## 2017-05-14 DIAGNOSIS — I130 Hypertensive heart and chronic kidney disease with heart failure and stage 1 through stage 4 chronic kidney disease, or unspecified chronic kidney disease: Secondary | ICD-10-CM | POA: Diagnosis not present

## 2017-05-14 DIAGNOSIS — E669 Obesity, unspecified: Secondary | ICD-10-CM | POA: Diagnosis not present

## 2017-05-14 LAB — GLUCOSE, POCT (MANUAL RESULT ENTRY): POC GLUCOSE: 85 mg/dL (ref 70–99)

## 2017-05-14 MED ORDER — ACCU-CHEK SOFTCLIX LANCET DEV MISC: 5 refills | Status: AC

## 2017-05-14 MED ORDER — ALLOPURINOL 100 MG PO TABS: 100.0000 mg | ORAL_TABLET | Freq: Every day | ORAL | 6 refills | Status: DC

## 2017-05-14 NOTE — Patient Instructions (Signed)

## 2017-05-14 NOTE — Progress Notes (Signed)
Subjective:  Patient ID: Adam Dennis, male    DOB: 1960-11-26  Age: 56 y.o. MRN: [id number]  CC: Congestive Heart Failure and Diabetes   HPI Adam Dennis is a 56 year old male with a history of hypertension, stage III- IVchronic kidney disease, obstructive sleep apnea, chronic respiratory failure with hypoxia (on 4 L of oxygen at rest), type 2 diabetes mellitus (A1c 6.2), combined systolic and diastolic CHF (EF 56-81% from TEE of 03/2017) followed by cardiology, COPD (diagnosed on PFT from 02/19/17) who presents for a follow up visit.  He was recently seen by pulmonary on 04/28/17 with no change in his current management. He also informed me he was recently seen by cardiology, notes are not available.  He reports doing well and his weight has been stable, he denies pedal edema or chest pains.  He continually requires 4 L of oxygen at rest; he had previously reduced it to 3 L but then increasingly short of breath and had to increase back to 4 L. Is doing well on his CPAP machine. He will need recertification paperwork completed for his oxygen.  Diabetes is diet controlled; had recent laser surgery and is followed by [facility] with his last visit 5 days ago. He is requesting a refill of Accu-Chek lancets today.  Past Medical History:  Diagnosis Date  . Arthritis    "back" (02/06/2017)  . CHF (congestive heart failure) ([place])    new onset /Encounter Date 09/12/2006  . Chronic combined systolic and diastolic heart failure ([place])   . CKD (chronic kidney disease) stage 4, GFR 15-29 ml/min (HCC)   . Glaucoma, right eye   . High cholesterol   . Hypertension   . Hypertrophy of tonsils alone   . Myocardial infarction [facility])    "small one; ~ 2008" (02/06/2017)  . Obesity, unspecified   . On home oxygen therapy    "4L; 24/7" (04/10/2017)  . OSA on CPAP   . Type II diabetes mellitus ([place])     Past Surgical History:  Procedure Laterality Date  . HERNIA  REPAIR    . TEE WITHOUT CARDIOVERSION N/A 04/16/2017   Procedure: TRANSESOPHAGEAL ECHOCARDIOGRAM (TEE);  Surgeon: [name], MD;  Location: [facility] ENDOSCOPY;  Service: Cardiovascular;  Laterality: N/A;  . [place]   "when I was little baby"    No Known Allergies   Outpatient Medications Prior to Visit  Medication Sig Dispense Refill  . albuterol (PROVENTIL HFA;VENTOLIN HFA) 108 (90 Base) MCG/ACT inhaler Inhale 2 puffs into the lungs every 6 (six) hours as needed for wheezing or shortness of breath. 1 Inhaler 3  . amLODipine (NORVASC) 10 MG tablet Take 10 mg daily by mouth.  0  . aspirin 325 MG tablet Take 325 mg by mouth daily.    Marland Kitchen atorvastatin (LIPITOR) 40 MG tablet Take 1 tablet (40 mg total) by mouth daily. 30 tablet 3  . Blood Glucose Monitoring Suppl (ACCU-CHEK AVIVA) device Use as instructed daily. 1 each 0  . budesonide-formoterol (SYMBICORT) 160-4.5 MCG/ACT inhaler Inhale 2 puffs into the lungs 2 (two) times daily. 1 Inhaler 3  . bumetanide (BUMEX) 2 MG tablet Take 1 tablet (2 mg total) by mouth 2 (two) times daily. 60 tablet 2  . calcium carbonate (TUMS - DOSED IN MG ELEMENTAL CALCIUM) 500 MG chewable tablet Chew 1 tablet (200 mg of elemental calcium total) by mouth 2 (two) times daily.    . carvedilol (COREG) 6.25  MG tablet Take 6.25 mg by mouth 2 (two) times daily.  0  . glucose blood (ACCU-CHEK AVIVA) test strip Use as instructed daily 100 each 12  . hydrALAZINE (APRESOLINE) 50 MG tablet Take 0.5 tablets (25 mg total) by mouth 3 (three) times daily. 90 tablet 5  . isosorbide mononitrate (IMDUR) 30 MG 24 hr tablet Take 1 tablet (30 mg total) by mouth daily. 30 tablet 5  . Lancet Devices (ACCU-CHEK SOFTCLIX) lancets Use as instructed daily. 1 each 5  . lisinopril (PRINIVIL,ZESTRIL) 20 MG tablet Take 20 mg by mouth daily.  0  . nitroGLYCERIN (NITROSTAT) 0.4 MG SL tablet Place 1 tablet (0.4 mg total) under the tongue every 5 (five) minutes x 3 doses as  needed for chest pain. 25 tablet 1  . OXYGEN Inhale 4 L into the lungs daily.    . TRAVATAN Z 0.004 % SOLN ophthalmic solution Place 1 drop into both eyes at bedtime.  12  . allopurinol (ZYLOPRIM) 100 MG tablet Take 1 tablet (100 mg total) by mouth daily. 30 tablet 6   No facility-administered medications prior to visit.     ROS Review of Systems General: negative for fever, weight loss, appetite change Eyes: no visual symptoms. ENT: no ear symptoms, no sinus tenderness, no nasal congestion or sore throat. Neck: no pain  Respiratory: no wheezing, cough, shortness of breath is chronic but at baseline Cardiovascular: no chest pain, no dyspnea on exertion, no pedal edema, no orthopnea. Gastrointestinal: no abdominal pain, no diarrhea, no constipation Genito-Urinary: no urinary frequency, no dysuria, no polyuria. Hematologic: no bruising Endocrine: no cold or heat intolerance Neurological: no headaches, no seizures, no tremors Musculoskeletal: no joint pains, no joint swelling Skin: no pruritus, no rash. Psychological: no depression, no anxiety,    Objective:  BP 115/71   Pulse (!) 59   Temp (!) 97.4 F (36.3 C) (Oral)   Ht 6' (1.829 m)   Wt (!) 325 lb (147.4 kg)   SpO2 100%   BMI 44.08 kg/m   BP/Weight 05/14/2017 04/28/2017 62/69/4854  Systolic BP 627 035 97  Diastolic BP 71 70 59  Wt. (Lbs) 325 326.4 326.2  BMI 44.08 44.27 45.5      Physical Exam Constitutional: normal appearing,  Eyes: PERRLA HEENT: Head is atraumatic, normal sinuses, normal oropharynx, normal appearing tonsils and palate, tympanic membrane is normal bilaterally; 4L oxygen at rest Neck: normal range of motion, no thyromegaly, no JVD Cardiovascular: normal rate and rhythm, normal heart sounds, no murmurs, rub or gallop, no pedal edema Respiratory: clear to auscultation bilaterally, no wheezes, no rales, no rhonchi Abdomen: soft, lightly distended, not tender to palpation, normal bowel sounds, no  enlarged organs Extremities: Full ROM, no tenderness in joints Skin: warm and dry, no lesions. Neurological: alert, oriented x3, cranial nerves I-XII grossly intact , normal motor strength, normal sensation. Psychological: normal mood.   Lab Results  Component Value Date   HGBA1C 6.1 03/12/2017    Assessment & Plan:   1. Diabetes mellitus type 2 in obese ([place]) Diet controlled with A1c of 6.1 Keep blood sugar logs with fasting goals of 80-120 mg/dl, random of less than 180 and in the event of sugars less than 60 mg/dl or greater than 400 mg/dl please notify the clinic ASAP. It is recommended that you undergo annual eye exams and annual foot exams. Pneumovax is recommended every 5 years before the age of 7 and once for a lifetime at or after the age of 64. -  POCT glucose (manual entry)  2. Other emphysema (HCC) No acute exacerbations Continue Symbicort and Proventil  3. Chronic combined systolic and diastolic CHF (congestive heart failure) (HCC) EF 03-75%, normal systolic function from TEE of 04/16/17 Weight is stable Continue medications and follow-up with cardiology  4. CKD (chronic kidney disease) stage 4, GFR 15-29 ml/min (HCC) Closely followed by nephrology Avoid nephrotoxins  5. Idiopathic chronic gout without tophus, unspecified site No acute flare Continue renal dose of allopurinol  6.  Chronic respiratory failure Continue 4 L of oxygen at rest We will fax recertification paperwork [facility]  Meds ordered this encounter  Medications  . allopurinol (ZYLOPRIM) 100 MG tablet    Sig: Take 1 tablet (100 mg total) by mouth daily.    Dispense:  30 tablet    Refill:  6    Follow-up: Return in about 3 months (around 08/12/2017) for Follow-up of chronic medical conditions.   [name] MD

## 2017-05-22 DIAGNOSIS — R0902 Hypoxemia: Secondary | ICD-10-CM | POA: Diagnosis not present

## 2017-05-22 DIAGNOSIS — J449 Chronic obstructive pulmonary disease, unspecified: Secondary | ICD-10-CM | POA: Diagnosis not present

## 2017-05-28 ENCOUNTER — Encounter: Payer: Self-pay | Admitting: Pulmonary Disease

## 2017-06-02 ENCOUNTER — Institutional Professional Consult (permissible substitution): Payer: Medicaid Other | Admitting: Pulmonary Disease

## 2017-06-03 NOTE — Progress Notes (Signed)
Adam Dennis Note  06/04/2017     CHIEF COMPLAINT Patient presents for Post-op Follow-up   HISTORY OF PRESENT ILLNESS: Adam Dennis is a 57 y.o. male who presents to the [facility] today for:   HPI    Post-op Follow-up    In both eyes.  Discomfort includes Negative for pain, tearing, none, itching, discharge, foreign body sensation and floaters.  Vision is stable.          Comments    Pt presents today for F/U of NPDR OU; s/p laser retinopexy OD (11.26.18) OS (12.14.18), pt states vision is stable, denies flashes, floaters, pain or wavy vision, pts BS this AM was 81, pt is using Travatan,        Last edited by Adam Dennis, COT on 06/04/2017  8:23 AM. (History)      Referring physician: [name], MD [place], [place] [address]  HISTORICAL INFORMATION:   Selected notes from the MEDICAL RECORD NUMBER Referred by Dr. Zenia Dennis for RT OD LEE- 11.26.18 ([facility] [place]) Ocular Hx- glaucoma OU (on travatan OU qd) PMH- DM, COPD, CHF, kidney disease    CURRENT MEDICATIONS: Current Outpatient Medications (Ophthalmic Drugs)  Medication Sig  . TRAVATAN Z 0.004 % SOLN ophthalmic solution Place 1 drop into both eyes at bedtime.   No current facility-administered medications for this visit.  (Ophthalmic Drugs)   Current Outpatient Medications (Other)  Medication Sig  . albuterol (PROVENTIL HFA;VENTOLIN HFA) 108 (90 Base) MCG/ACT inhaler Inhale 2 puffs into the lungs every 6 (six) hours as needed for wheezing or shortness of breath.  . allopurinol (ZYLOPRIM) 100 MG tablet Take 1 tablet (100 mg total) by mouth daily.  Marland Kitchen amLODipine (NORVASC) 10 MG tablet Take 10 mg daily by mouth.  Marland Kitchen aspirin 325 MG tablet Take 325 mg by mouth daily.  Marland Kitchen atorvastatin (LIPITOR) 40 MG tablet Take 1 tablet (40 mg total) by mouth daily.  . Blood Glucose Monitoring Suppl (ACCU-CHEK AVIVA) device Use as instructed daily.  . budesonide-formoterol (SYMBICORT) 160-4.5  MCG/ACT inhaler Inhale 2 puffs into the lungs 2 (two) times daily.  . bumetanide (BUMEX) 2 MG tablet Take 1 tablet (2 mg total) by mouth 2 (two) times daily.  . calcium carbonate (TUMS - DOSED IN MG ELEMENTAL CALCIUM) 500 MG chewable tablet Chew 1 tablet (200 mg of elemental calcium total) by mouth 2 (two) times daily.  . carvedilol (COREG) 6.25 MG tablet Take 6.25 mg by mouth 2 (two) times daily.  Marland Kitchen glucose blood (ACCU-CHEK AVIVA) test strip Use as instructed daily  . hydrALAZINE (APRESOLINE) 50 MG tablet Take 0.5 tablets (25 mg total) by mouth 3 (three) times daily.  . isosorbide mononitrate (IMDUR) 30 MG 24 hr tablet Take 1 tablet (30 mg total) by mouth daily.  Elmore Guise Devices (ACCU-CHEK SOFTCLIX) lancets Use as instructed daily.  Marland Kitchen lisinopril (PRINIVIL,ZESTRIL) 20 MG tablet Take 20 mg by mouth daily.  . nitroGLYCERIN (NITROSTAT) 0.4 MG SL tablet Place 1 tablet (0.4 mg total) under the tongue every 5 (five) minutes x 3 doses as needed for chest pain.  . OXYGEN Inhale 4 L into the lungs daily.   No current facility-administered medications for this visit.  (Other)      REVIEW OF SYSTEMS: ROS    Positive for: Endocrine, Eyes   Negative for: Constitutional, Gastrointestinal, Neurological, Skin, Genitourinary, Musculoskeletal, HENT, Cardiovascular, Respiratory, Psychiatric, Allergic/Imm, Heme/Lymph   Last edited by Adam Dennis, COT on  06/04/2017  8:23 AM. (History)       ALLERGIES No Known Allergies  PAST MEDICAL HISTORY Past Medical History:  Diagnosis Date  . Arthritis    "back" (02/06/2017)  . CHF (congestive heart failure) ([place])    new onset /Encounter Date 09/12/2006  . Chronic combined systolic and diastolic heart failure ([place])   . CKD (chronic kidney disease) stage 4, GFR 15-29 ml/min (HCC)   . Glaucoma, right eye   . High cholesterol   . Hypertension   . Hypertrophy of tonsils alone   . Myocardial infarction [facility])    "small one; ~ 2008" (02/06/2017)  . Obesity,  unspecified   . On home oxygen therapy    "4L; 24/7" (04/10/2017)  . OSA on CPAP   . Type II diabetes mellitus ([place])    Past Surgical History:  Procedure Laterality Date  . HERNIA REPAIR    . TEE WITHOUT CARDIOVERSION N/A 04/16/2017   Procedure: TRANSESOPHAGEAL ECHOCARDIOGRAM (TEE);  Surgeon: [name], MD;  Location: [facility] ENDOSCOPY;  Service: Cardiovascular;  Laterality: N/A;  . UMBILICAL HERNIA REPAIR  1960s   "when I was little baby"    FAMILY HISTORY Family History  Problem Relation Age of Onset  . Hypertension Mother   . Diabetes Brother   . Diabetes Sister   . Cancer Maternal Aunt   . Amblyopia Neg Hx   . Blindness Neg Hx   . Cataracts Neg Hx   . Glaucoma Neg Hx   . Macular degeneration Neg Hx   . Strabismus Neg Hx   . Retinal detachment Neg Hx   . Retinitis pigmentosa Neg Hx     SOCIAL HISTORY Social History   Tobacco Use  . Smoking status: Never Smoker  . Smokeless tobacco: Never Used  Substance Use Topics  . Alcohol use: No  . Drug use: No         OPHTHALMIC EXAM:  Base Eye Exam    Visual Acuity (Snellen - Linear)      Right Left   Dist [place] 20/25 -2 20/30 -1   Dist ph [place] 20/20 -1 20/25 +2       Tonometry (Tonopen, 8:32 AM)      Right Left   Pressure 18 19       Pupils      Dark Light Shape React APD   Right 6 5 Round Minimal None   Left 6 5 Round Minimal None       Visual Fields (Counting fingers)      Left Right    Full Full       Extraocular Movement      Right Left    Full, Ortho Full, Ortho       Neuro/Psych    Oriented x3:  Yes   Mood/Affect:  Normal       Dilation    Both eyes:  1.0% Mydriacyl, 2.5% Phenylephrine @ 8:32 AM        Slit Lamp and Fundus Exam    External Exam      Right Left   External Normal Normal       Slit Lamp Exam      Right Left   Lids/Lashes Dermatochalasis - upper lid Dermatochalasis - upper lid   Conjunctiva/Sclera melanosis melanosis   Cornea Clear with mild arcus Clear with mild  arcus   Anterior Chamber Deep and quiet Deep and quiet   Iris Round and dilated, No NVI Round and dilated, No NVI  Lens 2+ Nuclear sclerosis, 2+ Cortical cataract 2+ Nuclear sclerosis, 2+ Cortical cataract   Vitreous mild Vitreous syneresis mild Vitreous syneresis, impending Posterior vitreous detachment       Fundus Exam      Right Left   Disc Normal Normal; impending PVD   C/D Ratio 0.1 0.3   Macula Flat, good foveal reflex, no heme or edema Flat, good foveal reflex, no heme or edema   Vessels copper wiring, Tortuous, dilated copper wiring, Tortuous, dilated   Periphery Two Inferior-temporal patches of pigmented lattice degeneration with retinal breaks - one with irregular break at 0730 no SRF -- good laser surrounding, patch of lattice at 1030 with atrophic holes -- good laser surrounding,  temporal blot hemorrhages,  Attached, 2 disc diameter resolving subretinal hemorrhage at 0130 -- clearing, Inferior-temporal Lattice degeneration - patches from 0300 to 0600 with atrophic holes with good laser surrounding          IMAGING AND PROCEDURES  Imaging and Procedures for 06/04/17  OCT, Retina - OU - Both Eyes     Right Eye Quality was good. Central Foveal Thickness: 264. Progression has been stable. Findings include normal foveal contour, no IRF, no SRF.   Left Eye Quality was good. Central Foveal Thickness: 265. Progression has been stable. Findings include normal foveal contour, no IRF, no SRF (Trace ERM).   Notes Images taken, stored on drive  Diagnosis / Impression:  NFP, No IRF, No SRF OU  Clinical management:  See below  Abbreviations: NFP - Normal foveal profile. CME - cystoid macular edema. PED - pigment epithelial detachment. IRF - intraretinal fluid. SRF - subretinal fluid. EZ - ellipsoid zone. ERM - epiretinal membrane. ORA - outer retinal atrophy. ORT - outer retinal tubulation. SRHM - subretinal hyper-reflective material         Fluorescein Angiography Optos  (Transit OD)     Right Eye Progression has no prior data. Early phase findings include vascular perfusion defect, window defect. Mid/Late phase findings include window defect, staining, vascular perfusion defect.   Left Eye Progression has no prior data. Early phase findings include vascular perfusion defect, window defect. Mid/Late phase findings include staining, window defect, vascular perfusion defect.   Notes Peripheral hyperfluorescent staining OU corresponding to laser retinopexy scars Mild far peripheral nonperfusion OU                ASSESSMENT/PLAN:    ICD-10-CM   1. Retinal breaks without detachment H35.89 OCT, Retina - OU - Both Eyes  2. Lattice degeneration of both retinas H35.413   3. Retinal edema H35.81   4. Hypertensive retinopathy of both eyes H35.033 Fluorescein Angiography Optos (Transit OD)  5. Moderate nonproliferative diabetic retinopathy of both eyes without macular edema associated with type 2 diabetes mellitus (HCC) G29.5284 Fluorescein Angiography Optos (Transit OD)  6. Combined form of age-related cataract, both eyes H25.813     1,2. Lattice degeneration with atrophic holes/breaks OU - discussed findings, prognosis and treatment options including laser retinopexy vs observation - recommend laser retinopexy OU - S/P laser retinopexy OD (11.26.18); S/P laser retinopexy OS (12.14.18) - laser OU looks good -- no new rt/rd  3. No retinal edema on exam or OCT  4. Hypertensive retinopathy OU - discussed importance of tight BP control - monitor  5. Moderate non-proliferative diabetic retinopathy w/o DME, both eyes - The incidence, risk factors for progression, natural history and treatment options for diabetic retinopathy were discussed with patient.   - The need for close monitoring  of blood glucose, blood pressure, and serum lipids, avoiding cigarette or any type of tobacco, and the need for long term follow up was also discussed with patient. -  exam with a few peripheral IRHs OU - FA today, 1.9.19, shows mild peripheral nonperfusion, no MAs or NV - OCT without diabetic macular edema, both eyes  - monitor - F/U 4 months  6. Combined forms of age related cataracts OU-  - The symptoms of cataract, surgical options, and treatments and risks were discussed with patient. - discussed diagnosis and progression - not yet visually significant - monitor for now  Ophthalmic Meds Ordered this visit:  No orders of the defined types were placed in this encounter.     Return in about 4 months (around 10/02/2017) for F/U NPDR OU.  There are no Patient Instructions on file for this visit.   Explained the diagnoses, plan, and follow up with the patient and they expressed understanding.  Patient expressed understanding of the importance of proper follow up care.   This document serves as a record of services personally performed by Gardiner Sleeper, MD, PhD. It was created on their behalf by Catha Brow, [place], a certified ophthalmic assistant. The creation of this record is the provider's dictation and/or activities during the visit.  Electronically signed by: Catha Brow, [place]  06/04/17 9:30 AM    Gardiner Sleeper, M.D., Ph.D. Diseases & Surgery of the Retina and [place] 06/04/17  I have reviewed the above documentation for accuracy and completeness, and I agree with the above. Gardiner Sleeper, M.D., Ph.D. 06/04/17 9:30 AM     Abbreviations: M myopia (nearsighted); A astigmatism; H hyperopia (farsighted); P presbyopia; Mrx spectacle prescription;  CTL contact lenses; OD right eye; OS left eye; OU both eyes  XT exotropia; ET esotropia; PEK punctate epithelial keratitis; PEE punctate epithelial erosions; DES dry eye syndrome; MGD meibomian gland dysfunction; ATs artificial tears; PFAT's preservative free artificial tears; [place] nuclear sclerotic cataract; PSC posterior subcapsular cataract; ERM  epi-retinal membrane; PVD posterior vitreous detachment; RD retinal detachment; DM diabetes mellitus; DR diabetic retinopathy; NPDR non-proliferative diabetic retinopathy; PDR proliferative diabetic retinopathy; CSME clinically significant macular edema; DME diabetic macular edema; dbh dot blot hemorrhages; CWS cotton wool spot; POAG primary open angle glaucoma; C/D cup-to-disc ratio; HVF humphrey visual field; GVF goldmann visual field; OCT optical coherence tomography; IOP intraocular pressure; BRVO Branch retinal vein occlusion; CRVO central retinal vein occlusion; CRAO central retinal artery occlusion; BRAO branch retinal artery occlusion; RT retinal tear; SB scleral buckle; PPV pars plana vitrectomy; VH Vitreous hemorrhage; PRP panretinal laser photocoagulation; IVK intravitreal kenalog; VMT vitreomacular traction; MH Macular hole;  NVD neovascularization of the disc; NVE neovascularization elsewhere; AREDS age related eye disease study; ARMD age related macular degeneration; POAG primary open angle glaucoma; EBMD epithelial/anterior basement membrane dystrophy; ACIOL anterior chamber intraocular lens; IOL intraocular lens; PCIOL posterior chamber intraocular lens; Phaco/IOL phacoemulsification with intraocular lens placement; [place] photorefractive keratectomy; LASIK laser assisted in situ keratomileusis; HTN hypertension; DM diabetes mellitus; COPD chronic obstructive pulmonary disease

## 2017-06-04 ENCOUNTER — Encounter (INDEPENDENT_AMBULATORY_CARE_PROVIDER_SITE_OTHER): Payer: Self-pay | Admitting: Ophthalmology

## 2017-06-04 ENCOUNTER — Ambulatory Visit (INDEPENDENT_AMBULATORY_CARE_PROVIDER_SITE_OTHER): Payer: Medicare Other | Admitting: Ophthalmology

## 2017-06-04 DIAGNOSIS — H35413 Lattice degeneration of retina, bilateral: Secondary | ICD-10-CM | POA: Diagnosis not present

## 2017-06-04 DIAGNOSIS — H35033 Hypertensive retinopathy, bilateral: Secondary | ICD-10-CM

## 2017-06-04 DIAGNOSIS — I10 Essential (primary) hypertension: Secondary | ICD-10-CM

## 2017-06-04 DIAGNOSIS — H25813 Combined forms of age-related cataract, bilateral: Secondary | ICD-10-CM

## 2017-06-04 DIAGNOSIS — H3589 Other specified retinal disorders: Secondary | ICD-10-CM

## 2017-06-04 DIAGNOSIS — H3581 Retinal edema: Secondary | ICD-10-CM | POA: Diagnosis not present

## 2017-06-04 DIAGNOSIS — E113393 Type 2 diabetes mellitus with moderate nonproliferative diabetic retinopathy without macular edema, bilateral: Secondary | ICD-10-CM

## 2017-06-04 DIAGNOSIS — E11311 Type 2 diabetes mellitus with unspecified diabetic retinopathy with macular edema: Secondary | ICD-10-CM | POA: Diagnosis not present

## 2017-06-06 ENCOUNTER — Telehealth: Payer: Self-pay | Admitting: Pulmonary Disease

## 2017-06-06 NOTE — Telephone Encounter (Signed)
Spoke with pt, I advised him that I don't see any notes of a phone call or results. Pt understood and nothing further is needed.

## 2017-06-11 ENCOUNTER — Encounter: Payer: Self-pay | Admitting: Family Medicine

## 2017-06-11 ENCOUNTER — Ambulatory Visit: Payer: Medicare Other | Attending: Family Medicine | Admitting: Family Medicine

## 2017-06-11 VITALS — BP 125/76 | HR 53 | Temp 97.4°F | Ht 72.0 in | Wt 320.0 lb

## 2017-06-11 DIAGNOSIS — Z7951 Long term (current) use of inhaled steroids: Secondary | ICD-10-CM | POA: Diagnosis not present

## 2017-06-11 DIAGNOSIS — I130 Hypertensive heart and chronic kidney disease with heart failure and stage 1 through stage 4 chronic kidney disease, or unspecified chronic kidney disease: Secondary | ICD-10-CM | POA: Insufficient documentation

## 2017-06-11 DIAGNOSIS — E1169 Type 2 diabetes mellitus with other specified complication: Secondary | ICD-10-CM

## 2017-06-11 DIAGNOSIS — Z9981 Dependence on supplemental oxygen: Secondary | ICD-10-CM | POA: Diagnosis not present

## 2017-06-11 DIAGNOSIS — Z79899 Other long term (current) drug therapy: Secondary | ICD-10-CM | POA: Diagnosis not present

## 2017-06-11 DIAGNOSIS — E1122 Type 2 diabetes mellitus with diabetic chronic kidney disease: Secondary | ICD-10-CM | POA: Insufficient documentation

## 2017-06-11 DIAGNOSIS — E1121 Type 2 diabetes mellitus with diabetic nephropathy: Secondary | ICD-10-CM | POA: Diagnosis not present

## 2017-06-11 DIAGNOSIS — G4733 Obstructive sleep apnea (adult) (pediatric): Secondary | ICD-10-CM | POA: Diagnosis not present

## 2017-06-11 DIAGNOSIS — J9611 Chronic respiratory failure with hypoxia: Secondary | ICD-10-CM

## 2017-06-11 DIAGNOSIS — Z7982 Long term (current) use of aspirin: Secondary | ICD-10-CM | POA: Diagnosis not present

## 2017-06-11 DIAGNOSIS — J438 Other emphysema: Secondary | ICD-10-CM | POA: Diagnosis not present

## 2017-06-11 DIAGNOSIS — I252 Old myocardial infarction: Secondary | ICD-10-CM | POA: Insufficient documentation

## 2017-06-11 DIAGNOSIS — N184 Chronic kidney disease, stage 4 (severe): Secondary | ICD-10-CM | POA: Diagnosis not present

## 2017-06-11 DIAGNOSIS — J449 Chronic obstructive pulmonary disease, unspecified: Secondary | ICD-10-CM | POA: Diagnosis present

## 2017-06-11 DIAGNOSIS — Z6841 Body Mass Index (BMI) 40.0 and over, adult: Secondary | ICD-10-CM | POA: Insufficient documentation

## 2017-06-11 DIAGNOSIS — E669 Obesity, unspecified: Secondary | ICD-10-CM

## 2017-06-11 DIAGNOSIS — J9612 Chronic respiratory failure with hypercapnia: Secondary | ICD-10-CM | POA: Diagnosis not present

## 2017-06-11 DIAGNOSIS — E7800 Pure hypercholesterolemia, unspecified: Secondary | ICD-10-CM | POA: Insufficient documentation

## 2017-06-11 DIAGNOSIS — I5042 Chronic combined systolic (congestive) and diastolic (congestive) heart failure: Secondary | ICD-10-CM

## 2017-06-11 LAB — POCT GLYCOSYLATED HEMOGLOBIN (HGB A1C): HEMOGLOBIN A1C: 6.1

## 2017-06-11 LAB — GLUCOSE, POCT (MANUAL RESULT ENTRY): POC GLUCOSE: 103 mg/dL — AB (ref 70–99)

## 2017-06-11 NOTE — Patient Instructions (Signed)

## 2017-06-11 NOTE — Progress Notes (Signed)
Subjective:  Patient ID: Adam Dennis, male    DOB: 1961-04-01  Age: 57 y.o. MRN: [id number]  CC: COPD   HPI Adam Dennis  is a 57 year old male with a history of hypertension, stage III- IVchronic kidney disease, obstructive sleep apnea, chronic respiratory failure with hypoxia (on 4 L of oxygen at rest), type 2 diabetes mellitus (A1c 6.1), combined systolic and diastolic CHF (EF 20-35% from TEE of 03/2017) followed by cardiology, COPD (diagnosed on PFT from 02/19/17) who presents for a follow up visit.  He has been compliant with Symbicort and Proventil and denies shortness of breath worsening from baseline. Currently doing well on 4 L of oxygen and denies wheezing, chest pains. He checks his weight at home and it has been stable; was 317 this morning at home but just 320 in the clinic today.  He has been compliant with a diabetic diet and low sodium as well and denies any visual concerns. He recently had bilateral eye laser procedures. She has no acute concerns today.  Past Medical History:  Diagnosis Date  . Arthritis    "back" (02/06/2017)  . CHF (congestive heart failure) ([place])    new onset /Encounter Date 09/12/2006  . Chronic combined systolic and diastolic heart failure ([place])   . CKD (chronic kidney disease) stage 4, GFR 15-29 ml/min (HCC)   . Glaucoma, right eye   . High cholesterol   . Hypertension   . Hypertrophy of tonsils alone   . Myocardial infarction [facility])    "small one; ~ 2008" (02/06/2017)  . Obesity, unspecified   . On home oxygen therapy    "4L; 24/7" (04/10/2017)  . OSA on CPAP   . Type II diabetes mellitus ([place])     Past Surgical History:  Procedure Laterality Date  . HERNIA REPAIR    . TEE WITHOUT CARDIOVERSION N/A 04/16/2017   Procedure: TRANSESOPHAGEAL ECHOCARDIOGRAM (TEE);  Surgeon: [name], MD;  Location: [facility] ENDOSCOPY;  Service: Cardiovascular;  Laterality: N/A;  . [place]   "when I was little baby"    No  Known Allergies   Outpatient Medications Prior to Visit  Medication Sig Dispense Refill  . albuterol (PROVENTIL HFA;VENTOLIN HFA) 108 (90 Base) MCG/ACT inhaler Inhale 2 puffs into the lungs every 6 (six) hours as needed for wheezing or shortness of breath. 1 Inhaler 3  . allopurinol (ZYLOPRIM) 100 MG tablet Take 1 tablet (100 mg total) by mouth daily. 30 tablet 6  . amLODipine (NORVASC) 10 MG tablet Take 10 mg daily by mouth.  0  . aspirin 325 MG tablet Take 325 mg by mouth daily.    Marland Kitchen atorvastatin (LIPITOR) 40 MG tablet Take 1 tablet (40 mg total) by mouth daily. 30 tablet 3  . Blood Glucose Monitoring Suppl (ACCU-CHEK AVIVA) device Use as instructed daily. 1 each 0  . budesonide-formoterol (SYMBICORT) 160-4.5 MCG/ACT inhaler Inhale 2 puffs into the lungs 2 (two) times daily. 1 Inhaler 3  . bumetanide (BUMEX) 2 MG tablet Take 1 tablet (2 mg total) by mouth 2 (two) times daily. 60 tablet 2  . calcium carbonate (TUMS - DOSED IN MG ELEMENTAL CALCIUM) 500 MG chewable tablet Chew 1 tablet (200 mg of elemental calcium total) by mouth 2 (two) times daily.    . carvedilol (COREG) 6.25 MG tablet Take 6.25 mg by mouth 2 (two) times daily.  0  . glucose blood (ACCU-CHEK AVIVA) test strip Use as instructed daily 100 each 12  .  hydrALAZINE (APRESOLINE) 50 MG tablet Take 0.5 tablets (25 mg total) by mouth 3 (three) times daily. 90 tablet 5  . isosorbide mononitrate (IMDUR) 30 MG 24 hr tablet Take 1 tablet (30 mg total) by mouth daily. 30 tablet 5  . Lancet Devices (ACCU-CHEK SOFTCLIX) lancets Use as instructed daily. 1 each 5  . lisinopril (PRINIVIL,ZESTRIL) 20 MG tablet Take 20 mg by mouth daily.  0  . nitroGLYCERIN (NITROSTAT) 0.4 MG SL tablet Place 1 tablet (0.4 mg total) under the tongue every 5 (five) minutes x 3 doses as needed for chest pain. 25 tablet 1  . OXYGEN Inhale 4 L into the lungs daily.    . TRAVATAN Z 0.004 % SOLN ophthalmic solution Place 1 drop into both eyes at bedtime.  12   No  facility-administered medications prior to visit.     ROS Review of Systems  Constitutional: Negative for activity change and appetite change.  HENT: Negative for sinus pressure and sore throat.   Eyes: Negative for visual disturbance.  Respiratory: Negative for cough, chest tightness and shortness of breath.   Cardiovascular: Negative for chest pain and leg swelling.  Gastrointestinal: Negative for abdominal distention, abdominal pain, constipation and diarrhea.  Endocrine: Negative.   Genitourinary: Negative for dysuria.  Musculoskeletal: Negative for joint swelling and myalgias.  Skin: Negative for rash.  Allergic/Immunologic: Negative.   Neurological: Negative for weakness, light-headedness and numbness.  Psychiatric/Behavioral: Negative for dysphoric mood and suicidal ideas.    Objective:  BP 125/76   Pulse (!) 53   Temp (!) 97.4 F (36.3 C) (Oral)   Ht 6' (1.829 m)   Wt (!) 320 lb (145.2 kg)   SpO2 100%   BMI 43.40 kg/m   BP/Weight 06/11/2017 05/14/2017 67/12/9379  Systolic BP 017 510 258  Diastolic BP 76 71 70  Wt. (Lbs) 320 325 326.4  BMI 43.4 44.08 44.27    Wt Readings from Last 3 Encounters:  06/11/17 (!) 320 lb (145.2 kg)  05/14/17 (!) 325 lb (147.4 kg)  04/28/17 (!) 326 lb 6.4 oz (148.1 kg)    Physical Exam  Constitutional: He is oriented to person, place, and time. He appears well-developed and well-nourished.  HENT:  Head: Normocephalic and atraumatic.  Right Ear: External ear normal.  Left Ear: External ear normal.  Eyes: Conjunctivae and EOM are normal. Pupils are equal, round, and reactive to light.  Neck: Normal range of motion. Neck supple. No tracheal deviation present.  Cardiovascular: Normal rate, regular rhythm and normal heart sounds.  No murmur heard. Pulmonary/Chest: Effort normal and breath sounds normal. No respiratory distress. He has no wheezes. He exhibits no tenderness.  Abdominal: Soft. Bowel sounds are normal. He exhibits no mass.  There is no tenderness.  Musculoskeletal: Normal range of motion. He exhibits no edema or tenderness.  Neurological: He is alert and oriented to person, place, and time.  Skin: Skin is warm and dry.  Psychiatric: He has a normal mood and affect.    Lab Results  Component Value Date   HGBA1C 6.1 06/11/2017    Assessment & Plan:   1. Diabetes mellitus type 2 in obese ([place]) Diet controlled with A1c of 6.1 Keep blood sugar logs with fasting goals of 80-120 mg/dl, random of less than 180 and in the event of sugars less than 60 mg/dl or greater than 400 mg/dl please notify the clinic ASAP. It is recommended that you undergo annual eye exams and annual foot exams. Pneumovax is recommended every 5 years  before the age of 65 and once for a lifetime at or after the age of 50. - POCT glucose (manual entry) - POCT glycosylated hemoglobin (Hb A1C)  2. Diabetic nephropathy associated with type 2 diabetes mellitus ([place]) Secondary to diabetic and hypertensive nephropathy Stage III CKD Followed by [place] [facility] Avoid nephrotoxic agents  3. Other emphysema (HCC) Controlled on Symbicort and Proventil  4. Chronic combined systolic and diastolic CHF (congestive heart failure) (HCC) EF 60-65% from echo of 03/2017 Euvolemic, weight is stable Continue daily weights, low sodium, heart healthy diet  5. Chronic respiratory failure with hypoxia and hypercapnia (HCC) Requesting 4 L of oxygen via nasal cannula   No orders of the defined types were placed in this encounter.   Follow-up: Return for Follow-up of chronic medical conditions, keep previously scheduled appointment.[name] MD

## 2017-06-24 ENCOUNTER — Encounter: Payer: Self-pay | Admitting: Pulmonary Disease

## 2017-06-24 ENCOUNTER — Ambulatory Visit (INDEPENDENT_AMBULATORY_CARE_PROVIDER_SITE_OTHER): Payer: Medicare Other | Admitting: Pulmonary Disease

## 2017-06-24 DIAGNOSIS — G4733 Obstructive sleep apnea (adult) (pediatric): Secondary | ICD-10-CM | POA: Diagnosis not present

## 2017-06-24 DIAGNOSIS — Z9981 Dependence on supplemental oxygen: Secondary | ICD-10-CM | POA: Diagnosis not present

## 2017-06-24 DIAGNOSIS — D649 Anemia, unspecified: Secondary | ICD-10-CM | POA: Diagnosis not present

## 2017-06-24 DIAGNOSIS — J9611 Chronic respiratory failure with hypoxia: Secondary | ICD-10-CM | POA: Diagnosis not present

## 2017-06-24 DIAGNOSIS — N2581 Secondary hyperparathyroidism of renal origin: Secondary | ICD-10-CM | POA: Diagnosis not present

## 2017-06-24 DIAGNOSIS — I129 Hypertensive chronic kidney disease with stage 1 through stage 4 chronic kidney disease, or unspecified chronic kidney disease: Secondary | ICD-10-CM | POA: Diagnosis not present

## 2017-06-24 DIAGNOSIS — N184 Chronic kidney disease, stage 4 (severe): Secondary | ICD-10-CM | POA: Diagnosis not present

## 2017-06-24 DIAGNOSIS — I5032 Chronic diastolic (congestive) heart failure: Secondary | ICD-10-CM | POA: Diagnosis not present

## 2017-06-24 NOTE — Progress Notes (Signed)
Subjective:    Patient ID: Adam Dennis, male    DOB: 1960-10-26, 57 y.o.   MRN: [id number]  HPI  57 year old morbidly obese man presents to establish care for obstructive sleep apnea/obesity hyperventilation syndrome.  NP SG 09/2006 was reviewed which showed severe OSA with AHI 161/hour and lowest desaturation of 58%.  His weight then was around 330 pounds.  Based on this he was initiated on CPAP by 13 cm and followed with my partner Dr. Annamaria Boots. After many years of using this, he collapsed with his CPAP therapy and stopped using this. He had a hospitalization 03/2017 for acute hypercarbic respiratory failure -7.3 0/80/52.  At that time he also had acute systolic heart failure, TEE was negative, EF subsequently recovered from 45% with normal, he was diuresed with Bumex to 328 pounds by discharge.  For some reason he was discharged on a trilogy machine with 4 L of oxygen blended in. He is compliant with this machine and this is really helped him.  His daytime somnolence and fatigue is improved He has lost about 5 pounds since then to his current weight of 323 pounds.  He is compliant with his diuretics. He lives with his girlfriend.  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 Epworth sleepiness score is 7 Bedtime is between 9 and 10 PM , sleep latency is minimal, he sleeps on his right side with one pillow, reports 3-4 nocturnal awakenings including nocturia and is out of bed at 7 AM feeling rested without dryness of mouth or headaches.  He rarely uses the humidifier on his machine  There is no history suggestive of cataplexy, sleep paralysis or parasomnias  Significant tests/ events reviewed  PFTs 01/2017 showed ratio of 85, FVC 28%, TLC 43% and DLCO of 35% and corrects for alveolar volume suggesting severe restriction likely due to obesity rather than intraparenchymal cause  Past Medical History:  Diagnosis Date  . Arthritis    "back" (02/06/2017)  . CHF (congestive heart failure) ([place])    new onset /Encounter Date 09/12/2006  . Chronic combined systolic and diastolic heart failure ([place])   . CKD (chronic kidney disease) stage 4, GFR 15-29 ml/min (HCC)   . Glaucoma, right eye   . High cholesterol   . Hypertension   . Hypertrophy of tonsils alone   . Myocardial infarction [facility])    "small one; ~ 2008" (02/06/2017)  . Obesity, unspecified   . On home oxygen therapy    "4L; 24/7" (04/10/2017)  . OSA on CPAP   . Type II diabetes mellitus ([place])    Past Surgical History:  Procedure Laterality Date  . HERNIA REPAIR    . TEE WITHOUT CARDIOVERSION N/A 04/16/2017   Procedure: TRANSESOPHAGEAL ECHOCARDIOGRAM (TEE);  Surgeon: [name], MD;  Location: [facility] ENDOSCOPY;  Service: Cardiovascular;  Laterality: N/A;  . UMBILICAL HERNIA REPAIR  1960s   "when I was little baby"    No Known Allergies  Social History   Socioeconomic History  . Marital status: Single    Spouse name: Not on file  . Number of children: 0  . Years of education: Not on file  . Highest education level: Not on file  Social Needs  . Financial resource strain: Not on file  . Food insecurity - worry: Not on file  . Food insecurity - inability: Not on file  . Transportation needs - medical: Not on file  . Transportation needs - non-medical: Not on file  Occupational History  .  Occupation: works as a Engineer, materials  . Smoking status: Never Smoker  . Smokeless tobacco: Never Used  Substance and Sexual Activity  . Alcohol use: No  . Drug use: No  . Sexual activity: Not Currently  Other Topics Concern  . Not on file  Social History Narrative  . Not on file     Family History  Problem Relation Age of Onset  . Hypertension Mother   . Diabetes Brother   . Diabetes Sister   . Cancer Maternal Aunt   . Amblyopia Neg Hx   . Blindness Neg Hx   . Cataracts Neg Hx   . Glaucoma Neg Hx   . Macular degeneration Neg Hx   . Strabismus Neg Hx   . Retinal detachment Neg Hx   . Retinitis pigmentosa  Neg Hx      Review of Systems  HENT: Positive for dental problem.   Cardiovascular: Positive for palpitations and leg swelling.  Allergic/Immunologic: Negative.   Neurological: Positive for headaches.       Objective:   Physical Exam  Gen. Pleasant, obese, in no distress, normal affect ENT - edentulous, no post nasal drip, class 2 airway Neck: No JVD, no thyromegaly, no carotid bruits Lungs: no use of accessory muscles, no dullness to percussion, decreased without rales or rhonchi  Cardiovascular: Rhythm regular, heart sounds  normal, no murmurs or gallops, 1+ peripheral edema Abdomen: soft and non-tender, no hepatosplenomegaly, BS normal. Musculoskeletal: No deformities, no cyanosis or clubbing Neuro:  alert, non focal, no tremors       Assessment & Plan:

## 2017-06-24 NOTE — Patient Instructions (Signed)
Titration study will be scheduled. Based on this we will change out your machine if required. Expectation would be that you use the machine at least 6 hours every night We will also reassess whether you still need oxygen or not -meanwhile decrease oxygen to 2 L during sleep

## 2017-06-24 NOTE — Assessment & Plan Note (Signed)
Titration study will be scheduled. Based on this we will change out your machine if required -he probably does not need to trilogy machine anymore, probably even a CPAP machine may suffice  Weight loss encouraged, compliance with goal of at least 4-6 hrs every night is the expectation. Advised against medications with sedative side effects Cautioned against driving when sleepy - understanding that sleepiness will vary on a day to day basis

## 2017-06-24 NOTE — Assessment & Plan Note (Signed)
We will also reassess whether you still need oxygen or not -meanwhile decrease oxygen to 2 L during sleep  Do not think that he has COPD, his hypoxia most likely related to systolic heart failure during his hospital admission

## 2017-06-27 ENCOUNTER — Telehealth: Payer: Self-pay | Admitting: Pulmonary Disease

## 2017-06-27 NOTE — Telephone Encounter (Signed)
Spoke with pt, he would like a note for work from 1/29. I asked him did RA take him out of work since he was here for a OSA visit and the notes did not say that he was taken out of work for any reason. He stated RA did not but he needs a note to RTW. RA please advise if note can be written.    Instructions      Return in about 3 months (around 09/22/2017).  Titration study will be scheduled. Based on this we will change out your machine if required. Expectation would be that you use the machine at least 6 hours every night We will also reassess whether you still need oxygen or not -meanwhile decrease oxygen to 2 L during sleep

## 2017-06-27 NOTE — Telephone Encounter (Signed)
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 Just state that he is under our care for obesity hypoventilation syndrome Do need report from DME on his trilogy machine

## 2017-07-01 ENCOUNTER — Ambulatory Visit (HOSPITAL_BASED_OUTPATIENT_CLINIC_OR_DEPARTMENT_OTHER): Payer: Medicare Other | Attending: Pulmonary Disease | Admitting: Pulmonary Disease

## 2017-07-01 DIAGNOSIS — G4733 Obstructive sleep apnea (adult) (pediatric): Secondary | ICD-10-CM | POA: Diagnosis not present

## 2017-07-01 NOTE — Telephone Encounter (Signed)
lmtcb x1 for pt. Will need to verify dates before witting letter.

## 2017-07-02 NOTE — Telephone Encounter (Signed)
Patient returned call, work note for out on 06/24/17 and return to work on 07/05/17.  CB is [phone].  States if he misses the call to please leave a message.

## 2017-07-02 NOTE — Telephone Encounter (Signed)
Pt is leaving the fax# [phone]  Pt number is [phone]

## 2017-07-02 NOTE — Telephone Encounter (Signed)
ATC pt, no answer. Left message for pt to call back.  

## 2017-07-02 NOTE — Telephone Encounter (Signed)
Signed and faxed letter to appropriate fax number. Spoke with Corene Cornea he is having RT fax on a download. Will leave open.

## 2017-07-02 NOTE — Telephone Encounter (Signed)
Spoke with pt, advised pt letter has been written. He would like it faxed to his employer. He will call back to give the fax number. Will await return call. Letter on triage desk.

## 2017-07-03 NOTE — Telephone Encounter (Signed)
Checked RA's cubby and folder up front, DL has not yet been received.   Community message sent to [place] at [facility] asking to refax DL to our office.  Will await fax.

## 2017-07-04 NOTE — Telephone Encounter (Signed)
Still awaiting fax from [facility].

## 2017-07-07 ENCOUNTER — Telehealth: Payer: Self-pay | Admitting: Pulmonary Disease

## 2017-07-07 DIAGNOSIS — G4733 Obstructive sleep apnea (adult) (pediatric): Secondary | ICD-10-CM | POA: Diagnosis not present

## 2017-07-07 NOTE — Telephone Encounter (Signed)
Pl let him know based on results of study  Send order to DME to -dc trilogy machine -dc O2 -Rx for  CPAP 15 cm H2O with a Medium size Fisher[employer]Paykel Full Face Mask Simplus mask and heated humidification. - OV in 6 wks with TP/ me

## 2017-07-07 NOTE — Procedures (Signed)
Patient Name: Adam Dennis, Adam Dennis Date: 07/01/2017 Gender: Male D.O.B: Oct 19, 1960 Age (years): 72 Referring Provider: Kara Mead MD, ABSM Height (inches): 72 Interpreting Physician: Kara Mead MD, ABSM Weight (lbs): 314 RPSGT: Laren Everts BMI: 43 MRN: [id number] Neck Size: 17.50 <br> <br>   CLINICAL INFORMATION The patient is referred for a CPAP titration to treat sleep apnea.  Date of NPSG :  09/2006 - severe OSA with AHI 161/hour and lowest desaturation of 58%  SLEEP STUDY TECHNIQUE As per the AASM Manual for the Scoring of Sleep and Associated Events v2.3 (April 2016) with a hypopnea requiring 4% desaturations.  The channels recorded and monitored were frontal, central and occipital EEG, electrooculogram (EOG), submentalis EMG (chin), nasal and oral airflow, thoracic and abdominal wall motion, anterior tibialis EMG, snore microphone, electrocardiogram, and pulse oximetry. Continuous positive airway pressure (CPAP) was initiated at the beginning of the study and titrated to treat sleep-disordered breathing.  RESPIRATORY PARAMETERS Optimal PAP Pressure (cm): 15 AHI at Optimal Pressure (/hr): 0.0 Overall Minimal O2 (%): 77.00 Supine % at Optimal Pressure (%): 100 Minimal O2 at Optimal Pressure (%): 88.0   SLEEP ARCHITECTURE The study was initiated at 10:41:02 PM and ended at 5:04:29 AM.  Sleep onset time was 27.2 minutes and the sleep efficiency was 86.3%. The total sleep time was 331.0 minutes.  The patient spent 4.83% of the night in stage N1 sleep, 52.42% in stage N2 sleep, 0.00% in stage N3 and 42.75% in REM.Stage REM latency was 47.0 minutes  Wake after sleep onset was 25.2. Alpha intrusion was absent. Supine sleep was 69.18%.  CARDIAC DATA The 2 lead EKG demonstrated sinus rhythm. The mean heart rate was 56.14 beats per minute. Other EKG findings include: PVCs.   LEG MOVEMENT DATA The total Periodic Limb Movements of Sleep (PLMS) were 0. The PLMS index was  0.00. A PLMS index of <15 is considered normal in adults.  IMPRESSIONS - The optimal PAP pressure was 15 cm of water. - Central sleep apnea was not noted during this titration (CAI = 0.0/h). - Severe oxygen desaturations were observed during this titration (min O2 = 77.00%). - The patient snored with moderate snoring volume during this titration study. - 2-lead EKG demonstrated: PVCs - Clinically significant periodic limb movements were not noted during this study. Arousals associated with PLMs were rare.   DIAGNOSIS - Obstructive Sleep Apnea (327.23 [G47.33 ICD-10])   RECOMMENDATIONS - Trial of CPAP therapy on 15 cm H2O with a Medium size Fisher&Paykel Full Face Mask Simplus mask and heated humidification. - Avoid alcohol, sedatives and other CNS depressants that may worsen sleep apnea and disrupt normal sleep architecture. - Sleep hygiene should be reviewed to assess factors that may improve sleep quality. - Weight management and regular exercise should be initiated or continued. - Return to Sleep Center for re-evaluation after 4 weeks of therapy   Kara Mead MD Board Certified in [place]

## 2017-07-08 NOTE — Telephone Encounter (Signed)
Orders placed, scheduled appointment, patient aware nothing further needed.

## 2017-07-08 NOTE — Telephone Encounter (Signed)
Pt returning call. Cb is [phone]

## 2017-07-08 NOTE — Telephone Encounter (Signed)
Attempted to call pt but no answer.   Left message for pt to return our call x1 

## 2017-07-10 ENCOUNTER — Telehealth: Payer: Self-pay | Admitting: Pulmonary Disease

## 2017-07-10 DIAGNOSIS — G4733 Obstructive sleep apnea (adult) (pediatric): Secondary | ICD-10-CM

## 2017-07-10 NOTE — Telephone Encounter (Signed)
Order has been placed to new DME.

## 2017-07-10 NOTE — Telephone Encounter (Signed)
Called and spoke to [place] with [facility]. Barbaraann Rondo states pt will need sleep study, as pt was discharged in 2018 on vent and now switching back to cpap after titration.   RA please advise. Thanks.

## 2017-07-10 NOTE — Telephone Encounter (Signed)
He had a sleep study done in 2008 and does not need another one. He needs a different DME company please

## 2017-07-16 ENCOUNTER — Telehealth: Payer: Self-pay | Admitting: Family Medicine

## 2017-07-16 MED ORDER — BUMETANIDE 2 MG PO TABS: 2.0000 mg | ORAL_TABLET | Freq: Two times a day (BID) | ORAL | 1 refills | Status: DC

## 2017-07-16 NOTE — Telephone Encounter (Signed)
Refilled

## 2017-07-16 NOTE — Telephone Encounter (Signed)
Patient called and requested for a refill on listed medications:  bumetanide (BUMEX) 2 MG tablet [[id number]]  Please send to  [place], [place]

## 2017-07-25 ENCOUNTER — Telehealth: Payer: Self-pay | Admitting: Family Medicine

## 2017-07-25 DIAGNOSIS — E1169 Type 2 diabetes mellitus with other specified complication: Secondary | ICD-10-CM

## 2017-07-25 DIAGNOSIS — E669 Obesity, unspecified: Principal | ICD-10-CM

## 2017-07-25 MED ORDER — GLUCOSE BLOOD VI STRP: ORAL_STRIP | 12 refills | Status: DC

## 2017-07-25 NOTE — Telephone Encounter (Signed)
Refilled

## 2017-07-25 NOTE — Telephone Encounter (Signed)
Vons from [facility] along with patient called the office to request test strips to be refilled in order for patient to check his blood sugar level. Please send it to [facility] on [address]. Please follow up.   Thank you.

## 2017-07-28 DIAGNOSIS — Z6841 Body Mass Index (BMI) 40.0 and over, adult: Secondary | ICD-10-CM | POA: Diagnosis not present

## 2017-07-28 DIAGNOSIS — G4733 Obstructive sleep apnea (adult) (pediatric): Secondary | ICD-10-CM | POA: Diagnosis not present

## 2017-07-28 DIAGNOSIS — I10 Essential (primary) hypertension: Secondary | ICD-10-CM | POA: Diagnosis not present

## 2017-07-28 DIAGNOSIS — J449 Chronic obstructive pulmonary disease, unspecified: Secondary | ICD-10-CM | POA: Diagnosis not present

## 2017-07-28 DIAGNOSIS — N189 Chronic kidney disease, unspecified: Secondary | ICD-10-CM | POA: Diagnosis not present

## 2017-07-28 DIAGNOSIS — M109 Gout, unspecified: Secondary | ICD-10-CM | POA: Diagnosis not present

## 2017-07-28 DIAGNOSIS — E785 Hyperlipidemia, unspecified: Secondary | ICD-10-CM | POA: Diagnosis not present

## 2017-07-29 ENCOUNTER — Telehealth: Payer: Self-pay

## 2017-07-29 NOTE — Telephone Encounter (Signed)
Call received from [facility], [place] stating that the patient had contacted his pharmacy and they do not have the prescription for the glucometer test strips. The prescription was to be sent to [facility] on [address].  Call placed to [facility] @ [place] # [phone]. Spoke to [place] in the pharmacy who stated that they received the prescription for the test strips but they were not covered by medicaid. The accuchek aviva have been covered by medicaid, so Ronalee Belts stated that he would call medicaid and inquire about the reason for non-coverage. He then noted that he would call the patient with an update.  Call placed to [facility] # [phone] and informed her of the call with the [facility] pharmacy. This CM also noted that the patient can call the pharmacy himself to check on the status of the order.

## 2017-08-08 ENCOUNTER — Telehealth: Payer: Self-pay | Admitting: Pulmonary Disease

## 2017-08-08 NOTE — Telephone Encounter (Signed)
Melissa returned call - no need to call her back at this time  1.  LMOM TCB x1 for patient - why did he not want to relinquish the Trilogy vent?  Does he want to stay on the Trilogy and not change to CPAP?  2.  Will go ahead and route to RA to make him aware that pt has refused to turn in the Trilogy per [facility] and to verify that pt will need to change to CPAP.

## 2017-08-08 NOTE — Telephone Encounter (Signed)
Called Melissa unable to reach left message to give [place] a call back on patient.

## 2017-08-08 NOTE — Telephone Encounter (Signed)
Pt is calling back [phone]

## 2017-08-08 NOTE — Telephone Encounter (Signed)
Patient returning call, CB is [phone].

## 2017-08-08 NOTE — Telephone Encounter (Signed)
Received some additional information from [place]: Per Melissa w/ [facility], they received an order to dc O2 and Trilogy and a new CPAP order/start was then sent to another DME ([facility]).  Per Melissa, the O2 has been picked up but the patient is refusing to relinquish the Trilogy.  Does patient need to continue the Trilogy or change to the CPAP w/ the other DME company?  If pt is to continue the Trilogy, then a new plan of treatment will need to be sent.

## 2017-08-08 NOTE — Telephone Encounter (Signed)
Pt is aware of Dr. Bari Mantis response. We are still waiting to hear back from [facility] in regards to this matter.

## 2017-08-08 NOTE — Telephone Encounter (Signed)
That is what I would want him to do, keep the trilogy until he receives his CPAP and then make the change

## 2017-08-08 NOTE — Telephone Encounter (Signed)
Melissa returning Corrine's call, CB is [phone].

## 2017-08-08 NOTE — Telephone Encounter (Signed)
Spoke with the pt  He states that he thinks that there was a miscommunication with him and [facility]  He states that he wanted to keep the trilogy vent until he received his CPAP machine  He wants [facility] to call him to discuss  [place] for [facility]

## 2017-08-08 NOTE — Telephone Encounter (Signed)
Called Lenna Sciara unable to reach will await call back.

## 2017-08-12 NOTE — Telephone Encounter (Signed)
Weidman but she is out today so I gave mary the message about the pt's trilogy. Nothing further is needed.

## 2017-08-13 ENCOUNTER — Ambulatory Visit: Payer: Medicare Other | Attending: Family Medicine | Admitting: Family Medicine

## 2017-08-13 VITALS — BP 145/83 | HR 62 | Temp 97.8°F | Ht 72.0 in | Wt 325.0 lb

## 2017-08-13 DIAGNOSIS — Z9981 Dependence on supplemental oxygen: Secondary | ICD-10-CM | POA: Insufficient documentation

## 2017-08-13 DIAGNOSIS — Z1211 Encounter for screening for malignant neoplasm of colon: Secondary | ICD-10-CM

## 2017-08-13 DIAGNOSIS — I252 Old myocardial infarction: Secondary | ICD-10-CM | POA: Diagnosis not present

## 2017-08-13 DIAGNOSIS — E1122 Type 2 diabetes mellitus with diabetic chronic kidney disease: Secondary | ICD-10-CM | POA: Diagnosis not present

## 2017-08-13 DIAGNOSIS — Z9889 Other specified postprocedural states: Secondary | ICD-10-CM | POA: Diagnosis not present

## 2017-08-13 DIAGNOSIS — Z7982 Long term (current) use of aspirin: Secondary | ICD-10-CM | POA: Diagnosis not present

## 2017-08-13 DIAGNOSIS — E669 Obesity, unspecified: Secondary | ICD-10-CM | POA: Diagnosis not present

## 2017-08-13 DIAGNOSIS — E1169 Type 2 diabetes mellitus with other specified complication: Secondary | ICD-10-CM | POA: Diagnosis not present

## 2017-08-13 DIAGNOSIS — G4733 Obstructive sleep apnea (adult) (pediatric): Secondary | ICD-10-CM | POA: Diagnosis not present

## 2017-08-13 DIAGNOSIS — E1121 Type 2 diabetes mellitus with diabetic nephropathy: Secondary | ICD-10-CM | POA: Diagnosis not present

## 2017-08-13 DIAGNOSIS — I5042 Chronic combined systolic (congestive) and diastolic (congestive) heart failure: Secondary | ICD-10-CM

## 2017-08-13 DIAGNOSIS — Z79899 Other long term (current) drug therapy: Secondary | ICD-10-CM | POA: Insufficient documentation

## 2017-08-13 DIAGNOSIS — Z6841 Body Mass Index (BMI) 40.0 and over, adult: Secondary | ICD-10-CM | POA: Diagnosis not present

## 2017-08-13 DIAGNOSIS — J449 Chronic obstructive pulmonary disease, unspecified: Secondary | ICD-10-CM | POA: Diagnosis not present

## 2017-08-13 DIAGNOSIS — I130 Hypertensive heart and chronic kidney disease with heart failure and stage 1 through stage 4 chronic kidney disease, or unspecified chronic kidney disease: Secondary | ICD-10-CM | POA: Insufficient documentation

## 2017-08-13 DIAGNOSIS — R0902 Hypoxemia: Secondary | ICD-10-CM | POA: Diagnosis not present

## 2017-08-13 DIAGNOSIS — H409 Unspecified glaucoma: Secondary | ICD-10-CM | POA: Diagnosis not present

## 2017-08-13 DIAGNOSIS — I10 Essential (primary) hypertension: Secondary | ICD-10-CM

## 2017-08-13 DIAGNOSIS — E7800 Pure hypercholesterolemia, unspecified: Secondary | ICD-10-CM | POA: Diagnosis not present

## 2017-08-13 DIAGNOSIS — N184 Chronic kidney disease, stage 4 (severe): Secondary | ICD-10-CM

## 2017-08-13 LAB — GLUCOSE, POCT (MANUAL RESULT ENTRY): POC Glucose: 116 mg/dl — AB (ref 70–99)

## 2017-08-13 MED ORDER — ISOSORBIDE MONONITRATE ER 30 MG PO TB24: 30.0000 mg | ORAL_TABLET | Freq: Every day | ORAL | 5 refills | Status: DC

## 2017-08-13 MED ORDER — HYDRALAZINE HCL 50 MG PO TABS: 25.0000 mg | ORAL_TABLET | Freq: Three times a day (TID) | ORAL | 5 refills | Status: DC

## 2017-08-13 MED ORDER — ATORVASTATIN CALCIUM 40 MG PO TABS: 40.0000 mg | ORAL_TABLET | Freq: Every day | ORAL | 3 refills | Status: DC

## 2017-08-13 NOTE — Patient Instructions (Signed)

## 2017-08-13 NOTE — Progress Notes (Signed)
Subjective:  Patient ID: Adam Dennis, male    DOB: 04-Apr-1961  Age: 57 y.o. MRN: [id number]  CC: Hypertension and Diabetes   HPI Adam Dennis is a 57 year old male with a history of hypertension, stage III- IVchronic kidney disease, obstructive sleep apnea (on Trilogy at night), type 2 diabetes mellitus (A1c 6.1), combined systolic and diastolic CHF (EF 33-00% from TEE of 03/2017) followed by cardiology, previously on 4 L of oxygen for chronic respiratory failure who presents today for a follow-up visit. Since his last office visit he has been seen by pulmonary and was taken of oxygen and he reports doing well with no complaints of dyspnea, shortness of breath and is rarely needing to use his inhalers which was used to treat COPD.  Dr. Elsworth Soho did not feel he had COPD but hypoxia was thought most likely to be related to his systolic heart failure during last hospitalization and the plan is to discontinue trilogy and place him on CPAP for his sleep apnea.  He checks his weight at home and informs me it has been stable (states it was 312 at home even though it reads 325 here in the clinic); denies pedal edema.  His cardiologist is Dr. Terrence Dupont. Nephrology appointment comes up on 08/27/17. With regards to his diabetes mellitus he is currently on diet control and denies hypoglycemia or numbness in extremities.  His ophthalmology appointment comes up in 2 months.  Past Medical History:  Diagnosis Date  . Arthritis    "back" (02/06/2017)  . CHF (congestive heart failure) ([place])    new onset /Encounter Date 09/12/2006  . Chronic combined systolic and diastolic heart failure ([place])   . CKD (chronic kidney disease) stage 4, GFR 15-29 ml/min (HCC)   . Glaucoma, right eye   . High cholesterol   . Hypertension   . Hypertrophy of tonsils alone   . Myocardial infarction [facility])    "small one; ~ 2008" (02/06/2017)  . Obesity, unspecified   . On home oxygen therapy    "4L; 24/7" (04/10/2017)  . OSA on CPAP    . Type II diabetes mellitus ([place])     Past Surgical History:  Procedure Laterality Date  . HERNIA REPAIR    . TEE WITHOUT CARDIOVERSION N/A 04/16/2017   Procedure: TRANSESOPHAGEAL ECHOCARDIOGRAM (TEE);  Surgeon: [name], MD;  Location: [facility] ENDOSCOPY;  Service: Cardiovascular;  Laterality: N/A;  . [place]   "when I was little baby"     Outpatient Medications Prior to Visit  Medication Sig Dispense Refill  . albuterol (PROVENTIL HFA;VENTOLIN HFA) 108 (90 Base) MCG/ACT inhaler Inhale 2 puffs into the lungs every 6 (six) hours as needed for wheezing or shortness of breath. 1 Inhaler 3  . allopurinol (ZYLOPRIM) 100 MG tablet Take 1 tablet (100 mg total) by mouth daily. 30 tablet 6  . amLODipine (NORVASC) 10 MG tablet Take 10 mg daily by mouth.  0  . aspirin 325 MG tablet Take 325 mg by mouth daily.    . Blood Glucose Monitoring Suppl (ACCU-CHEK AVIVA) device Use as instructed daily. 1 each 0  . budesonide-formoterol (SYMBICORT) 160-4.5 MCG/ACT inhaler Inhale 2 puffs into the lungs 2 (two) times daily. 1 Inhaler 3  . bumetanide (BUMEX) 2 MG tablet Take 1 tablet (2 mg total) by mouth 2 (two) times daily. 60 tablet 1  . calcium carbonate (TUMS - DOSED IN MG ELEMENTAL CALCIUM) 500 MG chewable tablet Chew 1 tablet (200 mg of elemental  calcium total) by mouth 2 (two) times daily.    . carvedilol (COREG) 6.25 MG tablet Take 6.25 mg by mouth 2 (two) times daily.  0  . glucose blood (ACCU-CHEK AVIVA) test strip Use as instructed once daily. E11.9 100 each 12  . Lancet Devices (ACCU-CHEK SOFTCLIX) lancets Use as instructed daily. 1 each 5  . lisinopril (PRINIVIL,ZESTRIL) 20 MG tablet Take 20 mg by mouth daily.  0  . nitroGLYCERIN (NITROSTAT) 0.4 MG SL tablet Place 1 tablet (0.4 mg total) under the tongue every 5 (five) minutes x 3 doses as needed for chest pain. 25 tablet 1  . OXYGEN Inhale 4 L into the lungs daily.    . TRAVATAN Z 0.004 % SOLN ophthalmic solution  Place 1 drop into both eyes at bedtime.  12  . atorvastatin (LIPITOR) 40 MG tablet Take 1 tablet (40 mg total) by mouth daily. 30 tablet 3  . hydrALAZINE (APRESOLINE) 50 MG tablet Take 0.5 tablets (25 mg total) by mouth 3 (three) times daily. 90 tablet 5  . isosorbide mononitrate (IMDUR) 30 MG 24 hr tablet Take 1 tablet (30 mg total) by mouth daily. 30 tablet 5   No facility-administered medications prior to visit.     ROS Review of Systems  Constitutional: Negative for activity change and appetite change.  HENT: Negative for sinus pressure and sore throat.   Eyes: Negative for visual disturbance.  Respiratory: Negative for cough, chest tightness and shortness of breath.   Cardiovascular: Negative for chest pain and leg swelling.  Gastrointestinal: Negative for abdominal distention, abdominal pain, constipation and diarrhea.  Endocrine: Negative.   Genitourinary: Negative for dysuria.  Musculoskeletal: Negative for joint swelling and myalgias.  Skin: Negative for rash.  Allergic/Immunologic: Negative.   Neurological: Negative for weakness, light-headedness and numbness.  Psychiatric/Behavioral: Negative for dysphoric mood and suicidal ideas.    Objective:  BP (!) 145/83   Pulse 62   Temp 97.8 F (36.6 C) (Oral)   Ht 6' (1.829 m)   Wt (!) 325 lb (147.4 kg)   SpO2 100%   BMI 44.08 kg/m   BP/Weight 08/13/2017 07/01/2017 3/41/9622  Systolic BP 297 - 989  Diastolic BP 83 - 76  Wt. (Lbs) 325 314 323.4  BMI 44.08 42.59 43.86      Physical Exam  Constitutional: He is oriented to person, place, and time. He appears well-developed and well-nourished.  Cardiovascular: Normal rate, normal heart sounds and intact distal pulses.  No murmur heard. Pulmonary/Chest: Effort normal and breath sounds normal. He has no wheezes. He has no rales. He exhibits no tenderness.  Abdominal: Soft. Bowel sounds are normal. He exhibits no distension and no mass. There is no tenderness.    Musculoskeletal: Normal range of motion.  Neurological: He is alert and oriented to person, place, and time.  Skin: Skin is warm and dry.  Psychiatric: He has a normal mood and affect.     Lab Results  Component Value Date   HGBA1C 6.1 06/11/2017    Assessment & Plan:   1. Diabetes mellitus type 2 in obese ([place]) Controlled with A1c of 6.1 Continue diet control, lifestyle modifications - POCT glucose (manual entry) - CMP14+EGFR; Future - Lipid panel; Future - Microalbumin/Creatinine Ratio, Urine; Future - Ambulatory referral to Podiatry - atorvastatin (LIPITOR) 40 MG tablet; Take 1 tablet (40 mg total) by mouth daily.  Dispense: 30 tablet; Refill: 3  2. Screening for colon cancer - Ambulatory referral to Gastroenterology  3. Essential hypertension, benign Controlled  Low-sodium, DASH diet - isosorbide mononitrate (IMDUR) 30 MG 24 hr tablet; Take 1 tablet (30 mg total) by mouth daily.  Dispense: 30 tablet; Refill: 5 - hydrALAZINE (APRESOLINE) 50 MG tablet; Take 0.5 tablets (25 mg total) by mouth 3 (three) times daily.  Dispense: 90 tablet; Refill: 5  4. Chronic combined systolic and diastolic CHF (congestive heart failure) (HCC) 60-65% from 03/2017 Euvolemic - isosorbide mononitrate (IMDUR) 30 MG 24 hr tablet; Take 1 tablet (30 mg total) by mouth daily.  Dispense: 30 tablet; Refill: 5 - hydrALAZINE (APRESOLINE) 50 MG tablet; Take 0.5 tablets (25 mg total) by mouth 3 (three) times daily.  Dispense: 90 tablet; Refill: 5  5. OSA (obstructive sleep apnea) Doing well on CPAP machine  6. CKD (chronic kidney disease) stage 4, GFR 15-29 ml/min (HCC) Closely followed by [facility]: Last creatinine was 2.8 Avoid nephrotoxins  7. Morbid obesity ([place]) Discussed the need to exercise, reduce portion sizes,avoid late meals   Meds ordered this encounter  Medications  . atorvastatin (LIPITOR) 40 MG tablet    Sig: Take 1 tablet (40 mg total) by mouth daily.     Dispense:  30 tablet    Refill:  3  . isosorbide mononitrate (IMDUR) 30 MG 24 hr tablet    Sig: Take 1 tablet (30 mg total) by mouth daily.    Dispense:  30 tablet    Refill:  5  . hydrALAZINE (APRESOLINE) 50 MG tablet    Sig: Take 0.5 tablets (25 mg total) by mouth 3 (three) times daily.    Dispense:  90 tablet    Refill:  5    Follow-up: Return in about 3 months (around 11/13/2017) for follow up of chronic medical conditions.   Charlott Rakes MD

## 2017-08-14 ENCOUNTER — Ambulatory Visit: Payer: Medicare Other | Attending: Family Medicine

## 2017-08-14 ENCOUNTER — Encounter: Payer: Self-pay | Admitting: Family Medicine

## 2017-08-14 DIAGNOSIS — E669 Obesity, unspecified: Secondary | ICD-10-CM | POA: Diagnosis not present

## 2017-08-14 DIAGNOSIS — E1169 Type 2 diabetes mellitus with other specified complication: Secondary | ICD-10-CM | POA: Diagnosis not present

## 2017-08-14 NOTE — Progress Notes (Signed)
Patient here for lab visit only 

## 2017-08-15 LAB — CMP14+EGFR
ALT: 9 IU/L (ref 0–44)
AST: 16 IU/L (ref 0–40)
Albumin/Globulin Ratio: 0.9 — ABNORMAL LOW (ref 1.2–2.2)
Albumin: 3.4 g/dL — ABNORMAL LOW (ref 3.5–5.5)
Alkaline Phosphatase: 60 IU/L (ref 39–117)
BUN/Creatinine Ratio: 11 (ref 9–20)
BUN: 34 mg/dL — AB (ref 6–24)
Bilirubin Total: 0.7 mg/dL (ref 0.0–1.2)
CALCIUM: 8.7 mg/dL (ref 8.7–10.2)
CHLORIDE: 104 mmol/L (ref 96–106)
CO2: 25 mmol/L (ref 20–29)
Creatinine, Ser: 3.21 mg/dL — ABNORMAL HIGH (ref 0.76–1.27)
GFR, EST AFRICAN AMERICAN: 24 mL/min/{1.73_m2} — AB (ref >59)
GFR, EST NON AFRICAN AMERICAN: 21 mL/min/{1.73_m2} — AB (ref >59)
GLUCOSE: 83 mg/dL (ref 65–99)
Globulin, Total: 3.8 g/dL (ref 1.5–4.5)
Potassium: 4.5 mmol/L (ref 3.5–5.2)
Sodium: 143 mmol/L (ref 134–144)
TOTAL PROTEIN: 7.2 g/dL (ref 6.0–8.5)

## 2017-08-15 LAB — LIPID PANEL
CHOL/HDL RATIO: 3 ratio (ref 0.0–5.0)
Cholesterol, Total: 129 mg/dL (ref 100–199)
HDL: 43 mg/dL (ref >39)
LDL Calculated: 73 mg/dL (ref 0–99)
Triglycerides: 63 mg/dL (ref 0–149)
VLDL Cholesterol Cal: 13 mg/dL (ref 5–40)

## 2017-08-15 LAB — MICROALBUMIN / CREATININE URINE RATIO
Creatinine, Urine: 27.5 mg/dL
Microalb/Creat Ratio: 273.1 mg/g creat — ABNORMAL HIGH (ref 0.0–30.0)
Microalbumin, Urine: 75.1 ug/mL

## 2017-08-19 ENCOUNTER — Telehealth: Payer: Self-pay

## 2017-08-19 NOTE — Telephone Encounter (Signed)
Patient was called and informed of lab results via voicemail. 

## 2017-08-20 ENCOUNTER — Encounter: Payer: Self-pay | Admitting: Adult Health

## 2017-08-20 ENCOUNTER — Other Ambulatory Visit: Payer: Self-pay | Admitting: Family Medicine

## 2017-08-20 ENCOUNTER — Ambulatory Visit (INDEPENDENT_AMBULATORY_CARE_PROVIDER_SITE_OTHER): Payer: Medicare Other | Admitting: Adult Health

## 2017-08-20 DIAGNOSIS — E662 Morbid (severe) obesity with alveolar hypoventilation: Secondary | ICD-10-CM | POA: Diagnosis not present

## 2017-08-20 DIAGNOSIS — G4733 Obstructive sleep apnea (adult) (pediatric): Secondary | ICD-10-CM

## 2017-08-20 NOTE — Patient Instructions (Signed)
Continue on TRILOGY At bedtime  . When able change over to CPAP At bedtime  . Work on healthy weight .  Follow up with Dr. Elsworth Soho  In 2-3 months with CPAP download.

## 2017-08-20 NOTE — Progress Notes (Signed)
@Patient  ID: Adam Dennis, male    DOB: [birth year], 57 y.o.   MRN: [id number]  Chief Complaint  Patient presents with  . Follow-up    OSA     Referring provider: Charlott Rakes, MD  HPI: 57 year old morbidly obese man followed for obstructive sleep apnea/obesity hyperventilation syndrome. PMH CHF   TEST  NP SG 09/2006 was reviewed which showed severe OSA with AHI 161/hour and lowest desaturation of 58%.  His weight then was around 330 pounds   PFTs 01/2017 showed ratio of 85, FVC 28%, TLC 43% and DLCO of 35% and corrects for alveolar volume suggesting severe restriction likely due to obesity rather than intraparenchymal cause    08/20/2017 Follow up : OSA /OHS  Patient returns for a 56-month follow-up.  Patient was seen last visit for his OHS and OSA.  He has been on TRILOGY machine with oxygen.  Patient was previously on CPAP.  Patient underwent a sleep study CPAP titration study on July 01, 2017 that showed optimal control on CPAP at 15 cm H2O.  Patient has been recommended to change to CPAP.  He has not been able to get this set up as of yet.  Appointment is made for next week.  Patient says he feels good.  He feels rested on his current regimen.  Denies any significant daytime sleepiness.   No Known Allergies  Immunization History  Administered Date(s) Administered  . Influenza Whole 06/02/2009  . Influenza,inj,Quad PF,6+ Mos 02/15/2013, 02/10/2015, 02/07/2017  . Pneumococcal Polysaccharide-23 12/13/2012  . Tdap 06/26/2015    Past Medical History:  Diagnosis Date  . Arthritis    "back" (02/06/2017)  . CHF (congestive heart failure) ([place])    new onset /Encounter Date 09/12/2006  . Chronic combined systolic and diastolic heart failure ([place])   . CKD (chronic kidney disease) stage 4, GFR 15-29 ml/min (HCC)   . Glaucoma, right eye   . High cholesterol   . Hypertension   . Hypertrophy of tonsils alone   . Myocardial infarction [facility])    "small one; ~ 2008"  (02/06/2017)  . Obesity, unspecified   . On home oxygen therapy    "4L; 24/7" (04/10/2017)  . OSA on CPAP   . Type II diabetes mellitus (HCC)     Tobacco History: Social History   Tobacco Use  Smoking Status Never Smoker  Smokeless Tobacco Never Used   Counseling given: Not Answered   Outpatient Encounter Medications as of 08/20/2017  Medication Sig  . albuterol (PROVENTIL HFA;VENTOLIN HFA) 108 (90 Base) MCG/ACT inhaler Inhale 2 puffs into the lungs every 6 (six) hours as needed for wheezing or shortness of breath.  . allopurinol (ZYLOPRIM) 100 MG tablet Take 1 tablet (100 mg total) by mouth daily.  Marland Kitchen amLODipine (NORVASC) 10 MG tablet Take 10 mg daily by mouth.  Marland Kitchen aspirin 325 MG tablet Take 325 mg by mouth daily.  Marland Kitchen atorvastatin (LIPITOR) 40 MG tablet Take 1 tablet (40 mg total) by mouth daily.  . Blood Glucose Monitoring Suppl (ACCU-CHEK AVIVA) device Use as instructed daily.  . budesonide-formoterol (SYMBICORT) 160-4.5 MCG/ACT inhaler Inhale 2 puffs into the lungs 2 (two) times daily.  . bumetanide (BUMEX) 2 MG tablet Take 1 tablet (2 mg total) by mouth 2 (two) times daily.  . calcium carbonate (TUMS - DOSED IN MG ELEMENTAL CALCIUM) 500 MG chewable tablet Chew 1 tablet (200 mg of elemental calcium total) by mouth 2 (two) times daily.  . carvedilol (COREG) 6.25 MG tablet Take  6.25 mg by mouth 2 (two) times daily.  Marland Kitchen glucose blood (ACCU-CHEK AVIVA) test strip Use as instructed once daily. E11.9  . hydrALAZINE (APRESOLINE) 50 MG tablet Take 0.5 tablets (25 mg total) by mouth 3 (three) times daily.  . isosorbide mononitrate (IMDUR) 30 MG 24 hr tablet Take 1 tablet (30 mg total) by mouth daily.  Elmore Guise Devices (ACCU-CHEK SOFTCLIX) lancets Use as instructed daily.  Marland Kitchen lisinopril (PRINIVIL,ZESTRIL) 20 MG tablet Take 20 mg by mouth daily.  . nitroGLYCERIN (NITROSTAT) 0.4 MG SL tablet Place 1 tablet (0.4 mg total) under the tongue every 5 (five) minutes x 3 doses as needed for chest pain.    . OXYGEN Inhale 4 L into the lungs daily.  . TRAVATAN Z 0.004 % SOLN ophthalmic solution Place 1 drop into both eyes at bedtime.   No facility-administered encounter medications on file as of 08/20/2017.      Review of Systems  Constitutional:   No  weight loss, night sweats,  Fevers, chills,  +fatigue, or  lassitude.  HEENT:   No headaches,  Difficulty swallowing,  Tooth/dental problems, or  Sore throat,                No sneezing, itching, ear ache, nasal congestion, post nasal drip,   CV:  No chest pain,  Orthopnea, PND, swelling in lower extremities, anasarca, dizziness, palpitations, syncope.   GI  No heartburn, indigestion, abdominal pain, nausea, vomiting, diarrhea, change in bowel habits, loss of appetite, bloody stools.   Resp:    No chest wall deformity  Skin: no rash or lesions.  GU: no dysuria, change in color of urine, no urgency or frequency.  No flank pain, no hematuria   MS:  No joint pain or swelling.  No decreased range of motion.  No back pain.    Physical Exam  BP 128/62 (BP Location: Right Arm, Cuff Size: Large)   Pulse 80   Ht 6' (1.829 m)   Wt (!) 321 lb 9.6 oz (145.9 kg)   SpO2 97%   BMI 43.62 kg/m   GEN: A/Ox3; pleasant , NAD, obese    HEENT:  /AT,  EACs-clear, TMs-wnl, NOSE-clear, THROAT-clear, no lesions, no postnasal drip or exudate noted. Class 3 MP airway , poor dentition   NECK:  Supple w/ fair ROM; no JVD; normal carotid impulses w/o bruits; no thyromegaly or nodules palpated; no lymphadenopathy.    RESP  Clear  P & A; w/o, wheezes/ rales/ or rhonchi. no accessory muscle use, no dullness to percussion  CARD:  RRR, no m/r/g, tr  peripheral edema, pulses intact, no cyanosis or clubbing.  GI:   Soft & nt; nml bowel sounds; no organomegaly or masses detected.   Musco: Warm bil, no deformities or joint swelling noted.   Neuro: alert, no focal deficits noted.    Skin: Warm, no lesions or rashes    Lab Results:   Imaging: No  results found.   Assessment & Plan:   Obesity hypoventilation syndrome ([place]) Doing well on current regimen  He has had recent CPAP titration study with good control on 15cm H2O .  Will change over to CPAP once available .   Plan  Patient Instructions  Continue on TRILOGY At bedtime  . When able change over to CPAP At bedtime  . Work on healthy weight .  Follow up with Dr. Elsworth Soho  In 2-3 months with CPAP download.       Obstructive sleep apnea Patient Instructions  Continue on TRILOGY At bedtime  . When able change over to CPAP At bedtime  . Work on healthy weight .  Follow up with Dr. Elsworth Soho  In 2-3 months with CPAP download.          Rexene Edison, NP 08/20/2017

## 2017-08-20 NOTE — Assessment & Plan Note (Signed)
Patient Instructions  Continue on TRILOGY At bedtime  . When able change over to CPAP At bedtime  . Work on healthy weight .  Follow up with Dr. Elsworth Soho  In 2-3 months with CPAP download.

## 2017-08-20 NOTE — Assessment & Plan Note (Signed)
Doing well on current regimen  He has had recent CPAP titration study with good control on 15cm H2O .  Will change over to CPAP once available .   Plan  Patient Instructions  Continue on TRILOGY At bedtime  . When able change over to CPAP At bedtime  . Work on healthy weight .  Follow up with Dr. Elsworth Soho  In 2-3 months with CPAP download.

## 2017-08-27 DIAGNOSIS — N184 Chronic kidney disease, stage 4 (severe): Secondary | ICD-10-CM | POA: Diagnosis not present

## 2017-08-27 DIAGNOSIS — I129 Hypertensive chronic kidney disease with stage 1 through stage 4 chronic kidney disease, or unspecified chronic kidney disease: Secondary | ICD-10-CM | POA: Diagnosis not present

## 2017-08-27 DIAGNOSIS — G4733 Obstructive sleep apnea (adult) (pediatric): Secondary | ICD-10-CM | POA: Diagnosis not present

## 2017-08-27 DIAGNOSIS — N2581 Secondary hyperparathyroidism of renal origin: Secondary | ICD-10-CM | POA: Diagnosis not present

## 2017-08-27 DIAGNOSIS — E785 Hyperlipidemia, unspecified: Secondary | ICD-10-CM | POA: Diagnosis not present

## 2017-08-27 DIAGNOSIS — D631 Anemia in chronic kidney disease: Secondary | ICD-10-CM | POA: Diagnosis not present

## 2017-08-27 DIAGNOSIS — I5032 Chronic diastolic (congestive) heart failure: Secondary | ICD-10-CM | POA: Diagnosis not present

## 2017-09-01 ENCOUNTER — Telehealth: Payer: Self-pay | Admitting: Family Medicine

## 2017-09-01 NOTE — Telephone Encounter (Signed)
5 page, paperwork received through fax 09-01-17.

## 2017-09-09 ENCOUNTER — Other Ambulatory Visit: Payer: Self-pay | Admitting: Family Medicine

## 2017-09-16 ENCOUNTER — Other Ambulatory Visit: Payer: Self-pay | Admitting: Family Medicine

## 2017-09-18 ENCOUNTER — Encounter: Payer: Self-pay | Admitting: Gastroenterology

## 2017-09-19 ENCOUNTER — Other Ambulatory Visit: Payer: Self-pay | Admitting: Family Medicine

## 2017-09-24 ENCOUNTER — Other Ambulatory Visit: Payer: Self-pay | Admitting: Family Medicine

## 2017-09-24 MED ORDER — LISINOPRIL 20 MG PO TABS: 20.0000 mg | ORAL_TABLET | Freq: Every day | ORAL | 5 refills | Status: DC

## 2017-10-01 ENCOUNTER — Encounter: Payer: Self-pay | Admitting: Podiatry

## 2017-10-01 ENCOUNTER — Ambulatory Visit (INDEPENDENT_AMBULATORY_CARE_PROVIDER_SITE_OTHER): Payer: Medicare Other | Admitting: Podiatry

## 2017-10-01 DIAGNOSIS — B351 Tinea unguium: Secondary | ICD-10-CM | POA: Diagnosis not present

## 2017-10-01 DIAGNOSIS — M79609 Pain in unspecified limb: Secondary | ICD-10-CM

## 2017-10-01 DIAGNOSIS — E119 Type 2 diabetes mellitus without complications: Secondary | ICD-10-CM | POA: Diagnosis not present

## 2017-10-01 NOTE — Progress Notes (Signed)
.   This patient returns to the office follow-up for his annual diabetic foot exam.  . He states that he any medicines for the diabetes since his numbers run fine.   He states that he does have thick nails, which are painful walking  and wearing his shoes.  . He states he is unable to self treat.  Marland Kitchen He presents the office f or his annual diabetic foot exam and preventative foot care services.  General Appearance  Alert, conversant and in no acute stress.  Vascular  Dorsalis pedis and posterior tibial  pulses are palpable  bilaterally.  Capillary return is within normal limits  bilaterally. Temperature is within normal limits  bilaterally.  Neurologic  Senn-Weinstein monofilament wire test within normal limits  bilaterally. Muscle power within normal limits bilaterally.  Nails Thick disfigured discolored nails with subungual debris  from hallux to fifth toes bilaterally. No evidence of bacterial infection or drainage bilaterally.  Orthopedic  No limitations of motion of motion feet .  No crepitus or effusions noted.  No bony pathology or digital deformities noted.  Skin  normotropic skin with no porokeratosis noted bilaterally.  No signs of infections or ulcers noted.    Onychomycosis  B/L  Diabetes with no complications.  ROV for diabetic foot exam.  No vascular or neurologic pathology.  Debride nails  X 10.  RTC 3 months for preventative foot care services.   Gardiner Barefoot DPM

## 2017-10-07 ENCOUNTER — Other Ambulatory Visit: Payer: Self-pay | Admitting: Family Medicine

## 2017-10-08 ENCOUNTER — Encounter (INDEPENDENT_AMBULATORY_CARE_PROVIDER_SITE_OTHER): Payer: Medicare Other | Admitting: Ophthalmology

## 2017-10-14 ENCOUNTER — Other Ambulatory Visit: Payer: Self-pay | Admitting: Family Medicine

## 2017-10-15 ENCOUNTER — Encounter: Payer: Self-pay | Admitting: Pulmonary Disease

## 2017-10-15 ENCOUNTER — Other Ambulatory Visit: Payer: Self-pay | Admitting: Family Medicine

## 2017-10-15 ENCOUNTER — Ambulatory Visit (INDEPENDENT_AMBULATORY_CARE_PROVIDER_SITE_OTHER): Payer: Medicare Other | Admitting: Pulmonary Disease

## 2017-10-15 DIAGNOSIS — G4733 Obstructive sleep apnea (adult) (pediatric): Secondary | ICD-10-CM

## 2017-10-15 DIAGNOSIS — I5042 Chronic combined systolic (congestive) and diastolic (congestive) heart failure: Secondary | ICD-10-CM

## 2017-10-15 DIAGNOSIS — E662 Morbid (severe) obesity with alveolar hypoventilation: Secondary | ICD-10-CM | POA: Diagnosis not present

## 2017-10-15 MED ORDER — CARVEDILOL 6.25 MG PO TABS: 6.2500 mg | ORAL_TABLET | Freq: Two times a day (BID) | ORAL | 0 refills | Status: DC

## 2017-10-15 NOTE — Assessment & Plan Note (Signed)
Continue hydralazine and Bumex

## 2017-10-15 NOTE — Progress Notes (Signed)
   Subjective:    Patient ID: Adam Dennis, male    DOB: 22-Mar-1961, 57 y.o.   MRN: [id number]  HPI  57 year old morbidly obese man for FU of  obstructive sleep apnea/obesity hyperventilation syndrome.  He was hospitalized 03/2017 for acute hypercarbic respiratory failure -7.3 0/80/52.  At that time he also had acute systolic heart failure, TEE was negative, EF subsequently recovered from 45% with normal, he was diuresed with Bumex to 328 pounds by discharge.  For some reason he was discharged on a trilogy machine with 4 L of oxygen blended in.  Patient is subsequent titration study, trilogy was discontinued and he was started on CPAP.  No oxygen was required. He is very compliant with his CPAP machine and this has helped. He is also compliant with diuretics, weight is stable. He has been able to tolerate being off the oxygen. He cooks his own food and does admit to some noncompliance with sodium intake  He is off Symbicort now and only uses albuterol as needed    Significant tests/ events reviewed  NPSG 09/2006 >> severe OSA with AHI 161/hour and lowest desaturation of 58%.  Wt 330 Lbs >>CPAP by 13 cm  PFTs 01/2017 showed ratio of 85, FVC 28%, TLC 43% and DLCO of 35% and corrects for alveolar volume suggesting severe restriction likely due to obesity rather than intraparenchymal cause  CPAP titration 06/2017 >> 15 cm H2O, NO oxygen needed    Review of Systems neg for any significant sore throat, dysphagia, itching, sneezing, nasal congestion or excess/ purulent secretions, fever, chills, sweats, unintended wt loss, pleuritic or exertional cp, hempoptysis, orthopnea pnd or change in chronic leg swelling.   Also denies presyncope, palpitations, heartburn, abdominal pain, nausea, vomiting, diarrhea or change in bowel or urinary habits, dysuria,hematuria, rash, arthralgias, visual complaints, headache, numbness weakness or ataxia.     Objective:   Physical Exam  Gen. Pleasant,  obese, in no distress ENT - class 2 airway, no post nasal drip, missin teeth Neck: No JVD, no thyromegaly, no carotid bruits Lungs: no use of accessory muscles, no dullness to percussion, decreased without rales or rhonchi  Cardiovascular: Rhythm regular, heart sounds  normal, no murmurs or gallops, no peripheral edema Musculoskeletal: No deformities, no cyanosis or clubbing , no tremors        Assessment & Plan:

## 2017-10-15 NOTE — Assessment & Plan Note (Signed)
Seems to be doing well on CPAP. Does not need oxygen.

## 2017-10-15 NOTE — Patient Instructions (Signed)
Continue on CPAP  

## 2017-10-15 NOTE — Assessment & Plan Note (Signed)
Continue CPAP current settings  Weight loss encouraged, compliance with goal of at least 4-6 hrs every night is the expectation. Advised against medications with sedative side effects Cautioned against driving when sleepy - understanding that sleepiness will vary on a day to day basis

## 2017-10-21 DIAGNOSIS — H2513 Age-related nuclear cataract, bilateral: Secondary | ICD-10-CM | POA: Diagnosis not present

## 2017-10-21 DIAGNOSIS — H10413 Chronic giant papillary conjunctivitis, bilateral: Secondary | ICD-10-CM | POA: Diagnosis not present

## 2017-10-21 DIAGNOSIS — E119 Type 2 diabetes mellitus without complications: Secondary | ICD-10-CM | POA: Diagnosis not present

## 2017-10-21 DIAGNOSIS — H40013 Open angle with borderline findings, low risk, bilateral: Secondary | ICD-10-CM | POA: Diagnosis not present

## 2017-10-24 ENCOUNTER — Telehealth: Payer: Self-pay | Admitting: Pulmonary Disease

## 2017-10-24 NOTE — Telephone Encounter (Signed)
Returned Patient call. Unable to find reason or note in his chart from our office calling.  I did see upcoming appt with GI and he stated that it may be there office calling.  Nothing further needed at this time.

## 2017-10-27 ENCOUNTER — Telehealth: Payer: Self-pay | Admitting: *Deleted

## 2017-10-27 ENCOUNTER — Other Ambulatory Visit: Payer: Self-pay | Admitting: *Deleted

## 2017-10-27 DIAGNOSIS — I1310 Hypertensive heart and chronic kidney disease without heart failure, with stage 1 through stage 4 chronic kidney disease, or unspecified chronic kidney disease: Secondary | ICD-10-CM | POA: Diagnosis not present

## 2017-10-27 DIAGNOSIS — M109 Gout, unspecified: Secondary | ICD-10-CM | POA: Diagnosis not present

## 2017-10-27 DIAGNOSIS — I10 Essential (primary) hypertension: Secondary | ICD-10-CM | POA: Diagnosis not present

## 2017-10-27 DIAGNOSIS — I5042 Chronic combined systolic (congestive) and diastolic (congestive) heart failure: Secondary | ICD-10-CM

## 2017-10-27 DIAGNOSIS — I5041 Acute combined systolic (congestive) and diastolic (congestive) heart failure: Secondary | ICD-10-CM | POA: Diagnosis not present

## 2017-10-27 DIAGNOSIS — E785 Hyperlipidemia, unspecified: Secondary | ICD-10-CM | POA: Diagnosis not present

## 2017-10-27 DIAGNOSIS — N189 Chronic kidney disease, unspecified: Secondary | ICD-10-CM | POA: Diagnosis not present

## 2017-10-27 DIAGNOSIS — G4733 Obstructive sleep apnea (adult) (pediatric): Secondary | ICD-10-CM | POA: Diagnosis not present

## 2017-10-27 MED ORDER — ISOSORBIDE MONONITRATE ER 30 MG PO TB24: 30.0000 mg | ORAL_TABLET | Freq: Every day | ORAL | 4 refills | Status: DC

## 2017-10-27 NOTE — Telephone Encounter (Signed)
Called pt no answer, left message for pt to call us back. Just need to make sure pt is not using home O2 and breathing is better since ED admission.

## 2017-10-27 NOTE — Telephone Encounter (Signed)
Patient called back. He denies being on any home Oxygen or any blood thinners.

## 2017-11-05 ENCOUNTER — Telehealth: Payer: Self-pay

## 2017-11-05 DIAGNOSIS — Z1211 Encounter for screening for malignant neoplasm of colon: Secondary | ICD-10-CM | POA: Diagnosis not present

## 2017-11-05 DIAGNOSIS — D126 Benign neoplasm of colon, unspecified: Secondary | ICD-10-CM | POA: Diagnosis not present

## 2017-11-05 NOTE — Telephone Encounter (Signed)
Patient No Showed for Pre-Visit today. A message was left on his voicemail to call and reschedule his PV before 5:00 Pm today. If patient does not reschedule a no show letter will be mailed and his procedure will be cancelled per [place] guidelines.   Riki Sheer, LPN ( PV )

## 2017-11-06 ENCOUNTER — Other Ambulatory Visit: Payer: Self-pay | Admitting: Family Medicine

## 2017-11-07 DIAGNOSIS — D126 Benign neoplasm of colon, unspecified: Secondary | ICD-10-CM | POA: Diagnosis not present

## 2017-11-07 DIAGNOSIS — Z1211 Encounter for screening for malignant neoplasm of colon: Secondary | ICD-10-CM | POA: Diagnosis not present

## 2017-11-12 ENCOUNTER — Encounter: Payer: Self-pay | Admitting: Family Medicine

## 2017-11-12 ENCOUNTER — Ambulatory Visit: Payer: Medicare Other | Attending: Family Medicine | Admitting: Family Medicine

## 2017-11-12 VITALS — BP 130/82 | HR 57 | Temp 97.2°F | Ht 72.0 in | Wt 330.0 lb

## 2017-11-12 DIAGNOSIS — H42 Glaucoma in diseases classified elsewhere: Secondary | ICD-10-CM | POA: Insufficient documentation

## 2017-11-12 DIAGNOSIS — Z6841 Body Mass Index (BMI) 40.0 and over, adult: Secondary | ICD-10-CM | POA: Insufficient documentation

## 2017-11-12 DIAGNOSIS — Z7951 Long term (current) use of inhaled steroids: Secondary | ICD-10-CM | POA: Diagnosis not present

## 2017-11-12 DIAGNOSIS — Z79899 Other long term (current) drug therapy: Secondary | ICD-10-CM | POA: Diagnosis not present

## 2017-11-12 DIAGNOSIS — G4733 Obstructive sleep apnea (adult) (pediatric): Secondary | ICD-10-CM

## 2017-11-12 DIAGNOSIS — Z9981 Dependence on supplemental oxygen: Secondary | ICD-10-CM | POA: Diagnosis not present

## 2017-11-12 DIAGNOSIS — E669 Obesity, unspecified: Secondary | ICD-10-CM | POA: Diagnosis not present

## 2017-11-12 DIAGNOSIS — E1139 Type 2 diabetes mellitus with other diabetic ophthalmic complication: Secondary | ICD-10-CM | POA: Insufficient documentation

## 2017-11-12 DIAGNOSIS — E7800 Pure hypercholesterolemia, unspecified: Secondary | ICD-10-CM | POA: Diagnosis not present

## 2017-11-12 DIAGNOSIS — J438 Other emphysema: Secondary | ICD-10-CM | POA: Insufficient documentation

## 2017-11-12 DIAGNOSIS — N184 Chronic kidney disease, stage 4 (severe): Secondary | ICD-10-CM | POA: Insufficient documentation

## 2017-11-12 DIAGNOSIS — I10 Essential (primary) hypertension: Secondary | ICD-10-CM

## 2017-11-12 DIAGNOSIS — E1169 Type 2 diabetes mellitus with other specified complication: Secondary | ICD-10-CM

## 2017-11-12 DIAGNOSIS — E1121 Type 2 diabetes mellitus with diabetic nephropathy: Secondary | ICD-10-CM | POA: Insufficient documentation

## 2017-11-12 DIAGNOSIS — Z7982 Long term (current) use of aspirin: Secondary | ICD-10-CM | POA: Insufficient documentation

## 2017-11-12 DIAGNOSIS — I130 Hypertensive heart and chronic kidney disease with heart failure and stage 1 through stage 4 chronic kidney disease, or unspecified chronic kidney disease: Secondary | ICD-10-CM | POA: Insufficient documentation

## 2017-11-12 DIAGNOSIS — I252 Old myocardial infarction: Secondary | ICD-10-CM | POA: Insufficient documentation

## 2017-11-12 DIAGNOSIS — E1122 Type 2 diabetes mellitus with diabetic chronic kidney disease: Secondary | ICD-10-CM | POA: Diagnosis not present

## 2017-11-12 DIAGNOSIS — I5042 Chronic combined systolic (congestive) and diastolic (congestive) heart failure: Secondary | ICD-10-CM

## 2017-11-12 LAB — POCT GLYCOSYLATED HEMOGLOBIN (HGB A1C): HbA1c, POC (controlled diabetic range): 6 % (ref 0.0–7.0)

## 2017-11-12 LAB — GLUCOSE, POCT (MANUAL RESULT ENTRY): POC Glucose: 76 mg/dl (ref 70–99)

## 2017-11-12 MED ORDER — ALBUTEROL SULFATE HFA 108 (90 BASE) MCG/ACT IN AERS: 2.0000 | INHALATION_SPRAY | Freq: Four times a day (QID) | RESPIRATORY_TRACT | 3 refills | Status: DC | PRN

## 2017-11-12 MED ORDER — ALLOPURINOL 100 MG PO TABS: 100.0000 mg | ORAL_TABLET | Freq: Every day | ORAL | 1 refills | Status: DC

## 2017-11-12 MED ORDER — NITROGLYCERIN 0.4 MG SL SUBL: 0.4000 mg | SUBLINGUAL_TABLET | SUBLINGUAL | 1 refills | Status: DC | PRN

## 2017-11-12 MED ORDER — BUDESONIDE-FORMOTEROL FUMARATE 160-4.5 MCG/ACT IN AERO: 2.0000 | INHALATION_SPRAY | Freq: Two times a day (BID) | RESPIRATORY_TRACT | 3 refills | Status: DC

## 2017-11-12 MED ORDER — CARVEDILOL 6.25 MG PO TABS: 6.2500 mg | ORAL_TABLET | Freq: Two times a day (BID) | ORAL | 1 refills | Status: DC

## 2017-11-12 MED ORDER — ATORVASTATIN CALCIUM 40 MG PO TABS: 40.0000 mg | ORAL_TABLET | Freq: Every day | ORAL | 1 refills | Status: DC

## 2017-11-12 NOTE — Patient Instructions (Signed)

## 2017-11-12 NOTE — Progress Notes (Signed)
Subjective:  Patient ID: Adam Dennis, male    DOB: Oct 13, 1960  Age: 57 y.o. MRN: [id number]  CC: Diabetes   HPI Adam Dennis is a 57 year old male with a history of hypertension, stage III- IVchronic kidney disease, obstructive sleep apnea (on Trilogy at night), type 2 diabetes mellitus (A1c 6.0), combined systolic and diastolic CHF (EF 47-42% from TEE of 03/2017) followed by cardiology, previously on 4 L of oxygen for chronic respiratory failure who presents today for a follow-up visit. Informs me he was seen by Dr. Terrence Dupont his cardiologist last month and he denies shortness of breath, chest pains or weight gain.  He informs me his weight this morning was 317 pounds and has been stable but this weight was taken without colds however in the clinic he weighs 330 pounds. With regards to his diabetes mellitus he remains on diet control and denies hypoglycemia, visual concerns or numbness in extremities.  Last month he had his toenails debrided by [facility]. Up-to-date on annual eye exams. From a sleep apnea perspective he is doing well with his trilogy at night and does not require oxygen any longer; his exercise tolerance is good.  Seen by [facility] on 10/15/2017 with a 56-month follow-up recommended. He has no acute concerns today.  Past Medical History:  Diagnosis Date  . Arthritis    "back" (02/06/2017)  . CHF (congestive heart failure) ([place])    new onset /Encounter Date 09/12/2006  . Chronic combined systolic and diastolic heart failure ([place])   . CKD (chronic kidney disease) stage 4, GFR 15-29 ml/min (HCC)   . Glaucoma, right eye   . High cholesterol   . Hypertension   . Hypertrophy of tonsils alone   . Myocardial infarction [facility])    "small one; ~ 2008" (02/06/2017)  . Obesity, unspecified   . On home oxygen therapy    "4L; 24/7" (04/10/2017)  . OSA on CPAP   . Type II diabetes mellitus ([place])     Past Surgical History:  Procedure Laterality Date  .  HERNIA REPAIR    . TEE WITHOUT CARDIOVERSION N/A 04/16/2017   Procedure: TRANSESOPHAGEAL ECHOCARDIOGRAM (TEE);  Surgeon: [name], MD;  Location: [facility] ENDOSCOPY;  Service: Cardiovascular;  Laterality: N/A;  . [place]   "when I was little baby"    No Known Allergies   Outpatient Medications Prior to Visit  Medication Sig Dispense Refill  . amLODipine (NORVASC) 10 MG tablet Take 10 mg daily by mouth.  0  . aspirin 325 MG tablet Take 325 mg by mouth daily.    . Blood Glucose Monitoring Suppl (ACCU-CHEK AVIVA) device Use as instructed daily. 1 each 0  . bumetanide (BUMEX) 2 MG tablet TAKE 1 TABLET BY MOUTH TWICE DAILY 60 tablet 0  . calcium carbonate (TUMS - DOSED IN MG ELEMENTAL CALCIUM) 500 MG chewable tablet Chew 1 tablet (200 mg of elemental calcium total) by mouth 2 (two) times daily.    Marland Kitchen glucose blood (ACCU-CHEK AVIVA) test strip Use as instructed once daily. E11.9 100 each 12  . hydrALAZINE (APRESOLINE) 50 MG tablet Take 0.5 tablets (25 mg total) by mouth 3 (three) times daily. 90 tablet 5  . isosorbide mononitrate (IMDUR) 30 MG 24 hr tablet Take 1 tablet (30 mg total) by mouth daily. 30 tablet 4  . Lancet Devices (ACCU-CHEK SOFTCLIX) lancets Use as instructed daily. 1 each 5  . lisinopril (PRINIVIL,ZESTRIL) 20 MG tablet Take 1  tablet (20 mg total) by mouth daily. 30 tablet 5  . TRAVATAN Z 0.004 % SOLN ophthalmic solution Place 1 drop into both eyes at bedtime.  12  . albuterol (PROVENTIL HFA;VENTOLIN HFA) 108 (90 Base) MCG/ACT inhaler Inhale 2 puffs into the lungs every 6 (six) hours as needed for wheezing or shortness of breath. 1 Inhaler 3  . allopurinol (ZYLOPRIM) 100 MG tablet TAKE 1 TABLET BY MOUTH EVERY DAY 90 tablet 0  . atorvastatin (LIPITOR) 40 MG tablet Take 1 tablet (40 mg total) by mouth daily. 30 tablet 3  . budesonide-formoterol (SYMBICORT) 160-4.5 MCG/ACT inhaler Inhale 2 puffs into the lungs 2 (two) times daily. 1 Inhaler 3  . carvedilol  (COREG) 6.25 MG tablet Take 1 tablet (6.25 mg total) by mouth 2 (two) times daily. 60 tablet 0  . nitroGLYCERIN (NITROSTAT) 0.4 MG SL tablet Place 1 tablet (0.4 mg total) under the tongue every 5 (five) minutes x 3 doses as needed for chest pain. 25 tablet 1   No facility-administered medications prior to visit.     ROS Review of Systems  Constitutional: Negative for activity change and appetite change.  HENT: Negative for sinus pressure and sore throat.   Eyes: Negative for visual disturbance.  Respiratory: Negative for cough, chest tightness and shortness of breath.   Cardiovascular: Negative for chest pain and leg swelling.  Gastrointestinal: Negative for abdominal distention, abdominal pain, constipation and diarrhea.  Endocrine: Negative.   Genitourinary: Negative for dysuria.  Musculoskeletal: Negative for joint swelling and myalgias.  Skin: Negative for rash.  Allergic/Immunologic: Negative.   Neurological: Negative for weakness, light-headedness and numbness.  Psychiatric/Behavioral: Negative for dysphoric mood and suicidal ideas.    Objective:  BP 130/82   Pulse (!) 57   Temp (!) 97.2 F (36.2 C) (Oral)   Ht 6' (1.829 m)   Wt (!) 330 lb (149.7 kg)   SpO2 98%   BMI 44.76 kg/m   BP/Weight 11/12/2017 10/15/2017 8/92/1194  Systolic BP 174 081 448  Diastolic BP 82 80 62  Wt. (Lbs) 330 323.4 321.6  BMI 44.76 43.86 43.62      Physical Exam  Constitutional: He is oriented to person, place, and time. He appears well-developed and well-nourished.  Morbidly obese   Neck: No JVD present.  Cardiovascular: Normal rate, normal heart sounds and intact distal pulses.  No murmur heard. Pulmonary/Chest: Effort normal and breath sounds normal. He has no wheezes. He has no rales. He exhibits no tenderness.  Abdominal: Soft. Bowel sounds are normal. He exhibits no distension and no mass. There is no tenderness.  Musculoskeletal: Normal range of motion.  Neurological: He is alert  and oriented to person, place, and time.  Skin: Skin is warm and dry.  Psychiatric: He has a normal mood and affect.    CMP Latest Ref Rng & Units 08/14/2017 04/28/2017 04/23/2017  Glucose 65 - 99 mg/dL 83 87 83  BUN 6 - 24 mg/dL 34(H) 25(H) 30(H)  Creatinine 0.76 - 1.27 mg/dL 3.21(H) 2.84(H) 3.29(H)  Sodium 134 - 144 mmol/L 143 140 142  Potassium 3.5 - 5.2 mmol/L 4.5 3.6 4.5  Chloride 96 - 106 mmol/L 104 100 100  CO2 20 - 29 mmol/L 25 29 26   Calcium 8.7 - 10.2 mg/dL 8.7 9.4 8.9  Total Protein 6.0 - 8.5 g/dL 7.2 - 7.2  Total Bilirubin 0.0 - 1.2 mg/dL 0.7 - 0.6  Alkaline Phos 39 - 117 IU/L 60 - 42  AST 0 - 40 IU/L 16 -  14  ALT 0 - 44 IU/L 9 - 12    Lipid Panel     Component Value Date/Time   CHOL 129 08/14/2017 0956   TRIG 63 08/14/2017 0956   HDL 43 08/14/2017 0956   CHOLHDL 3.0 08/14/2017 0956   CHOLHDL 2.8 10/07/2014 1547   VLDL 13 10/07/2014 1547   LDLCALC 73 08/14/2017 0956    Lab Results  Component Value Date   HGBA1C 6.0 11/12/2017    Assessment & Plan:   1. Diabetes mellitus type 2 in obese ([employer]) Controlled with A1c of 6.0 -diet controlled Counseled on Diabetic diet, my plate method, 628 minutes of moderate intensity exercise/week Keep blood sugar logs with fasting goals of 80-120 mg/dl, random of less than 180 and in the event of sugars less than 60 mg/dl or greater than 400 mg/dl please notify the clinic ASAP. It is recommended that you undergo annual eye exams and annual foot exams. Pneumonia vaccine is recommended. - POCT glucose (manual entry) - POCT glycosylated hemoglobin (Hb A1C) - atorvastatin (LIPITOR) 40 MG tablet; Take 1 tablet (40 mg total) by mouth daily.  Dispense: 90 tablet; Refill: 1  2. Other emphysema (HCC) Stable, no acute exacerbations - budesonide-formoterol (SYMBICORT) 160-4.5 MCG/ACT inhaler; Inhale 2 puffs into the lungs 2 (two) times daily.  Dispense: 1 Inhaler; Refill: 3 - albuterol (PROVENTIL HFA;VENTOLIN HFA) 108 (90 Base) MCG/ACT  inhaler; Inhale 2 puffs into the lungs every 6 (six) hours as needed for wheezing or shortness of breath.  Dispense: 1 Inhaler; Refill: 3  3. Chronic combined systolic and diastolic CHF (congestive heart failure) (HCC) EF 60 to 65%; NYHA II Her weight at home has been stable but weight in clinic reveals a 5 pound weight gain in the last 3 months. Euvolemic Continue Bumex, ACE inhibitor, beta-blocker, hydralazine and Imdur Keep appointment with cardiologist  4. OSA (obstructive sleep apnea) Using Triology at night No longer requires oxygen Follow-up with pulmonary as scheduled  5. Essential hypertension, benign Controlled  6. CKD (chronic kidney disease) stage 4, GFR 15-29 ml/min (HCC) Hypertensive and diabetic nephropathy Closely followed by [facility] Avoid nephrotoxins   Meds ordered this encounter  Medications  . nitroGLYCERIN (NITROSTAT) 0.4 MG SL tablet    Sig: Place 1 tablet (0.4 mg total) under the tongue every 5 (five) minutes x 3 doses as needed for chest pain.    Dispense:  30 tablet    Refill:  1  . carvedilol (COREG) 6.25 MG tablet    Sig: Take 1 tablet (6.25 mg total) by mouth 2 (two) times daily.    Dispense:  180 tablet    Refill:  1  . budesonide-formoterol (SYMBICORT) 160-4.5 MCG/ACT inhaler    Sig: Inhale 2 puffs into the lungs 2 (two) times daily.    Dispense:  1 Inhaler    Refill:  3  . atorvastatin (LIPITOR) 40 MG tablet    Sig: Take 1 tablet (40 mg total) by mouth daily.    Dispense:  90 tablet    Refill:  1  . albuterol (PROVENTIL HFA;VENTOLIN HFA) 108 (90 Base) MCG/ACT inhaler    Sig: Inhale 2 puffs into the lungs every 6 (six) hours as needed for wheezing or shortness of breath.    Dispense:  1 Inhaler    Refill:  3  . allopurinol (ZYLOPRIM) 100 MG tablet    Sig: Take 1 tablet (100 mg total) by mouth daily.    Dispense:  90 tablet    Refill:  1    **Patient requests 90 days supply**    Follow-up: Return in about 3 months (around  02/12/2018) for Follow-up of chronic medical conditions.   Charlott Rakes MD

## 2017-11-14 ENCOUNTER — Other Ambulatory Visit: Payer: Self-pay | Admitting: Family Medicine

## 2017-11-14 MED ORDER — FLUTICASONE-SALMETEROL 250-50 MCG/DOSE IN AEPB: 1.0000 | INHALATION_SPRAY | Freq: Two times a day (BID) | RESPIRATORY_TRACT | 3 refills | Status: DC

## 2017-11-26 ENCOUNTER — Encounter: Payer: Medicare Other | Admitting: Gastroenterology

## 2017-12-07 ENCOUNTER — Other Ambulatory Visit: Payer: Self-pay | Admitting: Family Medicine

## 2017-12-08 ENCOUNTER — Other Ambulatory Visit: Payer: Self-pay | Admitting: Family Medicine

## 2017-12-08 DIAGNOSIS — E669 Obesity, unspecified: Principal | ICD-10-CM

## 2017-12-08 DIAGNOSIS — E1169 Type 2 diabetes mellitus with other specified complication: Secondary | ICD-10-CM

## 2017-12-25 DIAGNOSIS — N2581 Secondary hyperparathyroidism of renal origin: Secondary | ICD-10-CM | POA: Diagnosis not present

## 2017-12-25 DIAGNOSIS — D631 Anemia in chronic kidney disease: Secondary | ICD-10-CM | POA: Diagnosis not present

## 2017-12-25 DIAGNOSIS — N184 Chronic kidney disease, stage 4 (severe): Secondary | ICD-10-CM | POA: Diagnosis not present

## 2017-12-25 DIAGNOSIS — I5032 Chronic diastolic (congestive) heart failure: Secondary | ICD-10-CM | POA: Diagnosis not present

## 2017-12-25 DIAGNOSIS — I129 Hypertensive chronic kidney disease with stage 1 through stage 4 chronic kidney disease, or unspecified chronic kidney disease: Secondary | ICD-10-CM | POA: Diagnosis not present

## 2017-12-25 DIAGNOSIS — G4733 Obstructive sleep apnea (adult) (pediatric): Secondary | ICD-10-CM | POA: Diagnosis not present

## 2018-01-02 ENCOUNTER — Ambulatory Visit: Payer: Medicare Other | Admitting: Podiatry

## 2018-01-26 DIAGNOSIS — G4733 Obstructive sleep apnea (adult) (pediatric): Secondary | ICD-10-CM | POA: Diagnosis not present

## 2018-01-30 DIAGNOSIS — G4733 Obstructive sleep apnea (adult) (pediatric): Secondary | ICD-10-CM | POA: Diagnosis not present

## 2018-02-02 ENCOUNTER — Other Ambulatory Visit: Payer: Self-pay | Admitting: Family Medicine

## 2018-02-10 DIAGNOSIS — I10 Essential (primary) hypertension: Secondary | ICD-10-CM | POA: Diagnosis not present

## 2018-02-10 DIAGNOSIS — N189 Chronic kidney disease, unspecified: Secondary | ICD-10-CM | POA: Diagnosis not present

## 2018-02-10 DIAGNOSIS — I5041 Acute combined systolic (congestive) and diastolic (congestive) heart failure: Secondary | ICD-10-CM | POA: Diagnosis not present

## 2018-02-10 DIAGNOSIS — E785 Hyperlipidemia, unspecified: Secondary | ICD-10-CM | POA: Diagnosis not present

## 2018-02-10 DIAGNOSIS — M109 Gout, unspecified: Secondary | ICD-10-CM | POA: Diagnosis not present

## 2018-02-10 DIAGNOSIS — G4733 Obstructive sleep apnea (adult) (pediatric): Secondary | ICD-10-CM | POA: Diagnosis not present

## 2018-02-18 ENCOUNTER — Ambulatory Visit: Payer: Medicare Other | Admitting: Family Medicine

## 2018-02-25 DIAGNOSIS — G4733 Obstructive sleep apnea (adult) (pediatric): Secondary | ICD-10-CM | POA: Diagnosis not present

## 2018-03-03 ENCOUNTER — Other Ambulatory Visit: Payer: Self-pay | Admitting: Family Medicine

## 2018-03-04 ENCOUNTER — Ambulatory Visit: Payer: Medicare HMO | Attending: Family Medicine | Admitting: Family Medicine

## 2018-03-04 ENCOUNTER — Encounter: Payer: Self-pay | Admitting: Family Medicine

## 2018-03-04 VITALS — BP 142/87 | HR 64 | Temp 97.7°F | Ht 72.0 in | Wt 334.6 lb

## 2018-03-04 DIAGNOSIS — E1122 Type 2 diabetes mellitus with diabetic chronic kidney disease: Secondary | ICD-10-CM | POA: Insufficient documentation

## 2018-03-04 DIAGNOSIS — Z7951 Long term (current) use of inhaled steroids: Secondary | ICD-10-CM | POA: Insufficient documentation

## 2018-03-04 DIAGNOSIS — E662 Morbid (severe) obesity with alveolar hypoventilation: Secondary | ICD-10-CM

## 2018-03-04 DIAGNOSIS — E1169 Type 2 diabetes mellitus with other specified complication: Secondary | ICD-10-CM | POA: Diagnosis not present

## 2018-03-04 DIAGNOSIS — Z9981 Dependence on supplemental oxygen: Secondary | ICD-10-CM | POA: Insufficient documentation

## 2018-03-04 DIAGNOSIS — E669 Obesity, unspecified: Secondary | ICD-10-CM | POA: Diagnosis not present

## 2018-03-04 DIAGNOSIS — Z79899 Other long term (current) drug therapy: Secondary | ICD-10-CM | POA: Diagnosis not present

## 2018-03-04 DIAGNOSIS — M1990 Unspecified osteoarthritis, unspecified site: Secondary | ICD-10-CM | POA: Diagnosis not present

## 2018-03-04 DIAGNOSIS — Z6841 Body Mass Index (BMI) 40.0 and over, adult: Secondary | ICD-10-CM | POA: Insufficient documentation

## 2018-03-04 DIAGNOSIS — G4733 Obstructive sleep apnea (adult) (pediatric): Secondary | ICD-10-CM | POA: Insufficient documentation

## 2018-03-04 DIAGNOSIS — I5042 Chronic combined systolic (congestive) and diastolic (congestive) heart failure: Secondary | ICD-10-CM | POA: Diagnosis not present

## 2018-03-04 DIAGNOSIS — I10 Essential (primary) hypertension: Secondary | ICD-10-CM

## 2018-03-04 DIAGNOSIS — N184 Chronic kidney disease, stage 4 (severe): Secondary | ICD-10-CM | POA: Diagnosis not present

## 2018-03-04 DIAGNOSIS — H409 Unspecified glaucoma: Secondary | ICD-10-CM | POA: Insufficient documentation

## 2018-03-04 DIAGNOSIS — I130 Hypertensive heart and chronic kidney disease with heart failure and stage 1 through stage 4 chronic kidney disease, or unspecified chronic kidney disease: Secondary | ICD-10-CM | POA: Insufficient documentation

## 2018-03-04 DIAGNOSIS — E7800 Pure hypercholesterolemia, unspecified: Secondary | ICD-10-CM | POA: Insufficient documentation

## 2018-03-04 DIAGNOSIS — I252 Old myocardial infarction: Secondary | ICD-10-CM | POA: Insufficient documentation

## 2018-03-04 DIAGNOSIS — Z7982 Long term (current) use of aspirin: Secondary | ICD-10-CM | POA: Insufficient documentation

## 2018-03-04 DIAGNOSIS — J438 Other emphysema: Secondary | ICD-10-CM | POA: Insufficient documentation

## 2018-03-04 LAB — POCT GLYCOSYLATED HEMOGLOBIN (HGB A1C): HBA1C, POC (CONTROLLED DIABETIC RANGE): 5.7 % (ref 0.0–7.0)

## 2018-03-04 LAB — GLUCOSE, POCT (MANUAL RESULT ENTRY): POC GLUCOSE: 85 mg/dL (ref 70–99)

## 2018-03-04 MED ORDER — CARVEDILOL 6.25 MG PO TABS: 6.2500 mg | ORAL_TABLET | Freq: Two times a day (BID) | ORAL | 1 refills | Status: DC

## 2018-03-04 MED ORDER — ALBUTEROL SULFATE HFA 108 (90 BASE) MCG/ACT IN AERS: 2.0000 | INHALATION_SPRAY | Freq: Four times a day (QID) | RESPIRATORY_TRACT | 6 refills | Status: DC | PRN

## 2018-03-04 MED ORDER — ISOSORBIDE MONONITRATE ER 30 MG PO TB24: 30.0000 mg | ORAL_TABLET | Freq: Every day | ORAL | 1 refills | Status: DC

## 2018-03-04 MED ORDER — HYDRALAZINE HCL 25 MG PO TABS: 25.0000 mg | ORAL_TABLET | Freq: Three times a day (TID) | ORAL | 6 refills | Status: DC

## 2018-03-04 MED ORDER — LISINOPRIL 20 MG PO TABS: 20.0000 mg | ORAL_TABLET | Freq: Every day | ORAL | 1 refills | Status: DC

## 2018-03-04 MED ORDER — ATORVASTATIN CALCIUM 40 MG PO TABS: 40.0000 mg | ORAL_TABLET | Freq: Every day | ORAL | 1 refills | Status: DC

## 2018-03-04 MED ORDER — FLUTICASONE-SALMETEROL 250-50 MCG/DOSE IN AEPB
1.0000 | INHALATION_SPRAY | Freq: Two times a day (BID) | RESPIRATORY_TRACT | 6 refills | Status: DC
Start: 2018-03-04 — End: 2018-06-30

## 2018-03-04 NOTE — Progress Notes (Signed)
Subjective:  Patient ID: Adam Dennis, male    DOB: 1961/05/13  Age: 57 y.o. MRN: [id number]  CC: Diabetes   HPI Adam Dennis  is a 57 year old male with a history of hypertension, stage III- IVchronic kidney disease, obstructive sleep apnea (on CPAP at night), type 2 diabetes mellitus (A1c 5.7), combined systolic and diastolic CHF (EF 47-09% from TEE of 03/2017) followed by cardiology, obesity hypoventilation syndrome previously on 4 L of oxygen for chronic respiratory failure who presents today for a follow-up visit. He reports feeling fine and denies chest pains, shortness of breath, is doing well on his CPAP which he uses at night.  He has gained 17 pounds in the last 3 months but denies pedal edema or symptoms of fluid overload but endorses excessive eating and not exercising.  He is unable to check his weights at home as he is no longer in the tele-monitoring program. He was seen by his cardiologist about 2 to 3 weeks ago with no recommended changes in his regimen. His A1c is 5.7 which has improved from 6.0 previously and his diabetes is diet controlled.  He denies numbness in extremities, visual concerns and is up-to-date on his eye exams. Closely followed by [facility] for management of his chronic kidney disease.  He has no acute concerns today.  Past Medical History:  Diagnosis Date  . Arthritis    "back" (02/06/2017)  . CHF (congestive heart failure) ([place])    new onset /Encounter Date 09/12/2006  . Chronic combined systolic and diastolic heart failure ([place])   . CKD (chronic kidney disease) stage 4, GFR 15-29 ml/min (HCC)   . Glaucoma, right eye   . High cholesterol   . Hypertension   . Hypertrophy of tonsils alone   . Myocardial infarction [facility])    "small one; ~ 2008" (02/06/2017)  . Obesity, unspecified   . On home oxygen therapy    "4L; 24/7" (04/10/2017)  . OSA on CPAP   . Type II diabetes mellitus ([place])     Past Surgical History:  Procedure  Laterality Date  . HERNIA REPAIR    . TEE WITHOUT CARDIOVERSION N/A 04/16/2017   Procedure: TRANSESOPHAGEAL ECHOCARDIOGRAM (TEE);  Surgeon: [name], MD;  Location: [facility] ENDOSCOPY;  Service: Cardiovascular;  Laterality: N/A;  . [place]   "when I was little baby"    No Known Allergies   Outpatient Medications Prior to Visit  Medication Sig Dispense Refill  . allopurinol (ZYLOPRIM) 100 MG tablet Take 1 tablet (100 mg total) by mouth daily. 90 tablet 1  . amLODipine (NORVASC) 10 MG tablet Take 10 mg daily by mouth.  0  . aspirin 325 MG tablet Take 325 mg by mouth daily.    . Blood Glucose Monitoring Suppl (ACCU-CHEK AVIVA) device Use as instructed daily. 1 each 0  . bumetanide (BUMEX) 2 MG tablet TAKE 1 TABLET BY MOUTH TWICE DAILY 60 tablet 0  . calcium carbonate (TUMS - DOSED IN MG ELEMENTAL CALCIUM) 500 MG chewable tablet Chew 1 tablet (200 mg of elemental calcium total) by mouth 2 (two) times daily.    Marland Kitchen glucose blood (ACCU-CHEK AVIVA) test strip Use as instructed once daily. E11.9 100 each 12  . Lancet Devices (ACCU-CHEK SOFTCLIX) lancets Use as instructed daily. 1 each 5  . nitroGLYCERIN (NITROSTAT) 0.4 MG SL tablet Place 1 tablet (0.4 mg total) under the tongue every 5 (five) minutes x 3 doses as needed for chest pain.  30 tablet 1  . TRAVATAN Z 0.004 % SOLN ophthalmic solution Place 1 drop into both eyes at bedtime.  12  . albuterol (PROVENTIL HFA;VENTOLIN HFA) 108 (90 Base) MCG/ACT inhaler Inhale 2 puffs into the lungs every 6 (six) hours as needed for wheezing or shortness of breath. 1 Inhaler 3  . atorvastatin (LIPITOR) 40 MG tablet Take 1 tablet (40 mg total) by mouth daily. 90 tablet 1  . carvedilol (COREG) 6.25 MG tablet Take 1 tablet (6.25 mg total) by mouth 2 (two) times daily. 180 tablet 1  . Fluticasone-Salmeterol (ADVAIR DISKUS) 250-50 MCG/DOSE AEPB Inhale 1 puff into the lungs 2 (two) times daily. 1 each 3  . hydrALAZINE (APRESOLINE) 50 MG tablet  Take 0.5 tablets (25 mg total) by mouth 3 (three) times daily. 90 tablet 5  . isosorbide mononitrate (IMDUR) 30 MG 24 hr tablet Take 1 tablet (30 mg total) by mouth daily. 30 tablet 4  . lisinopril (PRINIVIL,ZESTRIL) 20 MG tablet Take 1 tablet (20 mg total) by mouth daily. 30 tablet 5   No facility-administered medications prior to visit.     ROS Review of Systems  Constitutional: Negative for activity change and appetite change.  HENT: Negative for sinus pressure and sore throat.   Eyes: Negative for visual disturbance.  Respiratory: Negative for cough, chest tightness and shortness of breath.   Cardiovascular: Negative for chest pain and leg swelling.  Gastrointestinal: Negative for abdominal distention, abdominal pain, constipation and diarrhea.  Endocrine: Negative.   Genitourinary: Negative for dysuria.  Musculoskeletal: Negative for joint swelling and myalgias.  Skin: Negative for rash.  Allergic/Immunologic: Negative.   Neurological: Negative for weakness, light-headedness and numbness.  Psychiatric/Behavioral: Negative for dysphoric mood and suicidal ideas.    Objective:  BP (!) 142/87   Pulse 64   Temp 97.7 F (36.5 C) (Oral)   Ht 6' (1.829 m)   Wt (!) 334 lb 9.6 oz (151.8 kg)   SpO2 95%   BMI 45.38 kg/m   BP/Weight 03/04/2018 11/12/2017 5/39/7673  Systolic BP 419 379 024  Diastolic BP 87 82 80  Wt. (Lbs) 334.6 330 323.4  BMI 45.38 44.76 43.86      Physical Exam  Constitutional: He is oriented to person, place, and time. He appears well-developed and well-nourished.  HENT:  Right Ear: External ear normal.  Left Ear: External ear normal.  Mouth/Throat: Oropharynx is clear and moist.  Neck: No JVD present.  Cardiovascular: Normal rate, normal heart sounds and intact distal pulses.  No murmur heard. Pulmonary/Chest: Effort normal and breath sounds normal. He has no wheezes. He has no rales. He exhibits no tenderness.  Abdominal: Soft. Bowel sounds are normal.  He exhibits no distension and no mass. There is no tenderness.  Musculoskeletal: Normal range of motion.  Neurological: He is alert and oriented to person, place, and time.  Skin: Skin is warm and dry.  Psychiatric: He has a normal mood and affect.     CMP Latest Ref Rng & Units 08/14/2017 04/28/2017 04/23/2017  Glucose 65 - 99 mg/dL 83 87 83  BUN 6 - 24 mg/dL 34(H) 25(H) 30(H)  Creatinine 0.76 - 1.27 mg/dL 3.21(H) 2.84(H) 3.29(H)  Sodium 134 - 144 mmol/L 143 140 142  Potassium 3.5 - 5.2 mmol/L 4.5 3.6 4.5  Chloride 96 - 106 mmol/L 104 100 100  CO2 20 - 29 mmol/L 25 29 26   Calcium 8.7 - 10.2 mg/dL 8.7 9.4 8.9  Total Protein 6.0 - 8.5 g/dL 7.2 - 7.2  Total Bilirubin 0.0 - 1.2 mg/dL 0.7 - 0.6  Alkaline Phos 39 - 117 IU/L 60 - 42  AST 0 - 40 IU/L 16 - 14  ALT 0 - 44 IU/L 9 - 12    Lipid Panel     Component Value Date/Time   CHOL 129 08/14/2017 0956   TRIG 63 08/14/2017 0956   HDL 43 08/14/2017 0956   CHOLHDL 3.0 08/14/2017 0956   CHOLHDL 2.8 10/07/2014 1547   VLDL 13 10/07/2014 1547   LDLCALC 73 08/14/2017 0956    Lab Results  Component Value Date   HGBA1C 5.7 03/04/2018    Assessment & Plan:   1. Diabetes mellitus type 2 in obese ([place]) Diet controlled with A1c of 5.7 Counseled on Diabetic diet, my plate method, 660 minutes of moderate intensity exercise/week Keep blood sugar logs with fasting goals of 80-120 mg/dl, random of less than 180 and in the event of sugars less than 60 mg/dl or greater than 400 mg/dl please notify the clinic ASAP. It is recommended that you undergo annual eye exams and annual foot exams. Pneumonia vaccine is recommended. - POCT glucose (manual entry) - POCT glycosylated hemoglobin (Hb A1C) - atorvastatin (LIPITOR) 40 MG tablet; Take 1 tablet (40 mg total) by mouth daily.  Dispense: 90 tablet; Refill: 1 - Basic Metabolic Panel  2. Essential hypertension, benign Slightly elevated No regimen change today Counseled on blood pressure goal of  less than 130/80, low-sodium, DASH diet, medication compliance, 150 minutes of moderate intensity exercise per week. Discussed medication compliance, adverse effects. - isosorbide mononitrate (IMDUR) 30 MG 24 hr tablet; Take 1 tablet (30 mg total) by mouth daily.  Dispense: 90 tablet; Refill: 1 - hydrALAZINE (APRESOLINE) 25 MG tablet; Take 1 tablet (25 mg total) by mouth 3 (three) times daily.  Dispense: 90 tablet; Refill: 6  3. Chronic combined systolic and diastolic CHF (congestive heart failure) (HCC) EF 60 to 65% from 03/2017 - isosorbide mononitrate (IMDUR) 30 MG 24 hr tablet; Take 1 tablet (30 mg total) by mouth daily.  Dispense: 90 tablet; Refill: 1 - hydrALAZINE (APRESOLINE) 25 MG tablet; Take 1 tablet (25 mg total) by mouth 3 (three) times daily.  Dispense: 90 tablet; Refill: 6  4. Other emphysema ([place]) Doing well on the Proventil and is not needing his ICS/LABA - albuterol (PROVENTIL HFA;VENTOLIN HFA) 108 (90 Base) MCG/ACT inhaler; Inhale 2 puffs into the lungs every 6 (six) hours as needed for wheezing or shortness of breath.  Dispense: 1 Inhaler; Refill: 6  5. CKD (chronic kidney disease) stage 4, GFR 15-29 ml/min (HCC) Stable Managed by nephrology  6. Obesity hypoventilation syndrome ([place]) Doing well off oxygen  7. OSA (obstructive sleep apnea) Continue CPAP at night  8. Morbid obesity ([place]) Advised on reducing portion sizes, increasing physical activity   Meds ordered this encounter  Medications  . lisinopril (PRINIVIL,ZESTRIL) 20 MG tablet    Sig: Take 1 tablet (20 mg total) by mouth daily.    Dispense:  90 tablet    Refill:  1  . isosorbide mononitrate (IMDUR) 30 MG 24 hr tablet    Sig: Take 1 tablet (30 mg total) by mouth daily.    Dispense:  90 tablet    Refill:  1  . hydrALAZINE (APRESOLINE) 25 MG tablet    Sig: Take 1 tablet (25 mg total) by mouth 3 (three) times daily.    Dispense:  90 tablet    Refill:  6  . Fluticasone-Salmeterol (ADVAIR DISKUS) 250-50  MCG/DOSE AEPB    Sig: Inhale 1 puff into the lungs 2 (two) times daily.    Dispense:  1 each    Refill:  6  . carvedilol (COREG) 6.25 MG tablet    Sig: Take 1 tablet (6.25 mg total) by mouth 2 (two) times daily.    Dispense:  180 tablet    Refill:  1  . albuterol (PROVENTIL HFA;VENTOLIN HFA) 108 (90 Base) MCG/ACT inhaler    Sig: Inhale 2 puffs into the lungs every 6 (six) hours as needed for wheezing or shortness of breath.    Dispense:  1 Inhaler    Refill:  6  . atorvastatin (LIPITOR) 40 MG tablet    Sig: Take 1 tablet (40 mg total) by mouth daily.    Dispense:  90 tablet    Refill:  1    Follow-up: Return in about 3 months (around 06/04/2018) for Follow-up of chronic medical conditions.   Charlott Rakes MD

## 2018-03-04 NOTE — Patient Instructions (Signed)

## 2018-03-05 ENCOUNTER — Telehealth: Payer: Self-pay

## 2018-03-05 LAB — BASIC METABOLIC PANEL
BUN / CREAT RATIO: 13 (ref 9–20)
BUN: 38 mg/dL — AB (ref 6–24)
CO2: 26 mmol/L (ref 20–29)
CREATININE: 3.03 mg/dL — AB (ref 0.76–1.27)
Calcium: 9 mg/dL (ref 8.7–10.2)
Chloride: 104 mmol/L (ref 96–106)
GFR, EST AFRICAN AMERICAN: 25 mL/min/{1.73_m2} — AB (ref >59)
GFR, EST NON AFRICAN AMERICAN: 22 mL/min/{1.73_m2} — AB (ref >59)
GLUCOSE: 82 mg/dL (ref 65–99)
Potassium: 4.9 mmol/L (ref 3.5–5.2)
SODIUM: 145 mmol/L — AB (ref 134–144)

## 2018-03-05 NOTE — Telephone Encounter (Signed)
Patient was called and informed of lab results and to keep appointment with the kidney doctor.

## 2018-03-05 NOTE — Telephone Encounter (Signed)
-----   Message from Charlott Rakes, MD sent at 03/05/2018  8:46 AM EDT ----- Kidney function reveals a decline but this has improved slightly compared to last set of labs.  Please encourage to keep appointment with nephrologist

## 2018-03-16 ENCOUNTER — Other Ambulatory Visit: Payer: Self-pay | Admitting: Family Medicine

## 2018-03-25 DIAGNOSIS — G4733 Obstructive sleep apnea (adult) (pediatric): Secondary | ICD-10-CM | POA: Diagnosis not present

## 2018-03-28 DIAGNOSIS — G4733 Obstructive sleep apnea (adult) (pediatric): Secondary | ICD-10-CM | POA: Diagnosis not present

## 2018-03-31 DIAGNOSIS — I129 Hypertensive chronic kidney disease with stage 1 through stage 4 chronic kidney disease, or unspecified chronic kidney disease: Secondary | ICD-10-CM | POA: Diagnosis not present

## 2018-03-31 DIAGNOSIS — N184 Chronic kidney disease, stage 4 (severe): Secondary | ICD-10-CM | POA: Diagnosis not present

## 2018-03-31 DIAGNOSIS — N2581 Secondary hyperparathyroidism of renal origin: Secondary | ICD-10-CM | POA: Diagnosis not present

## 2018-03-31 DIAGNOSIS — D631 Anemia in chronic kidney disease: Secondary | ICD-10-CM | POA: Diagnosis not present

## 2018-03-31 DIAGNOSIS — E1121 Type 2 diabetes mellitus with diabetic nephropathy: Secondary | ICD-10-CM | POA: Diagnosis not present

## 2018-04-22 IMAGING — DX DG CHEST 2V
2 series · 2 of 2 positions shown · non-contrast
Comparison: PA and lateral chest x-ray February 06, 2017

CLINICAL DATA: Acute on chronic shortness of breath on home oxygen.
History of COPD, previous MI, CHF.

EXAM:
CHEST  2 VIEW

[chest lat]
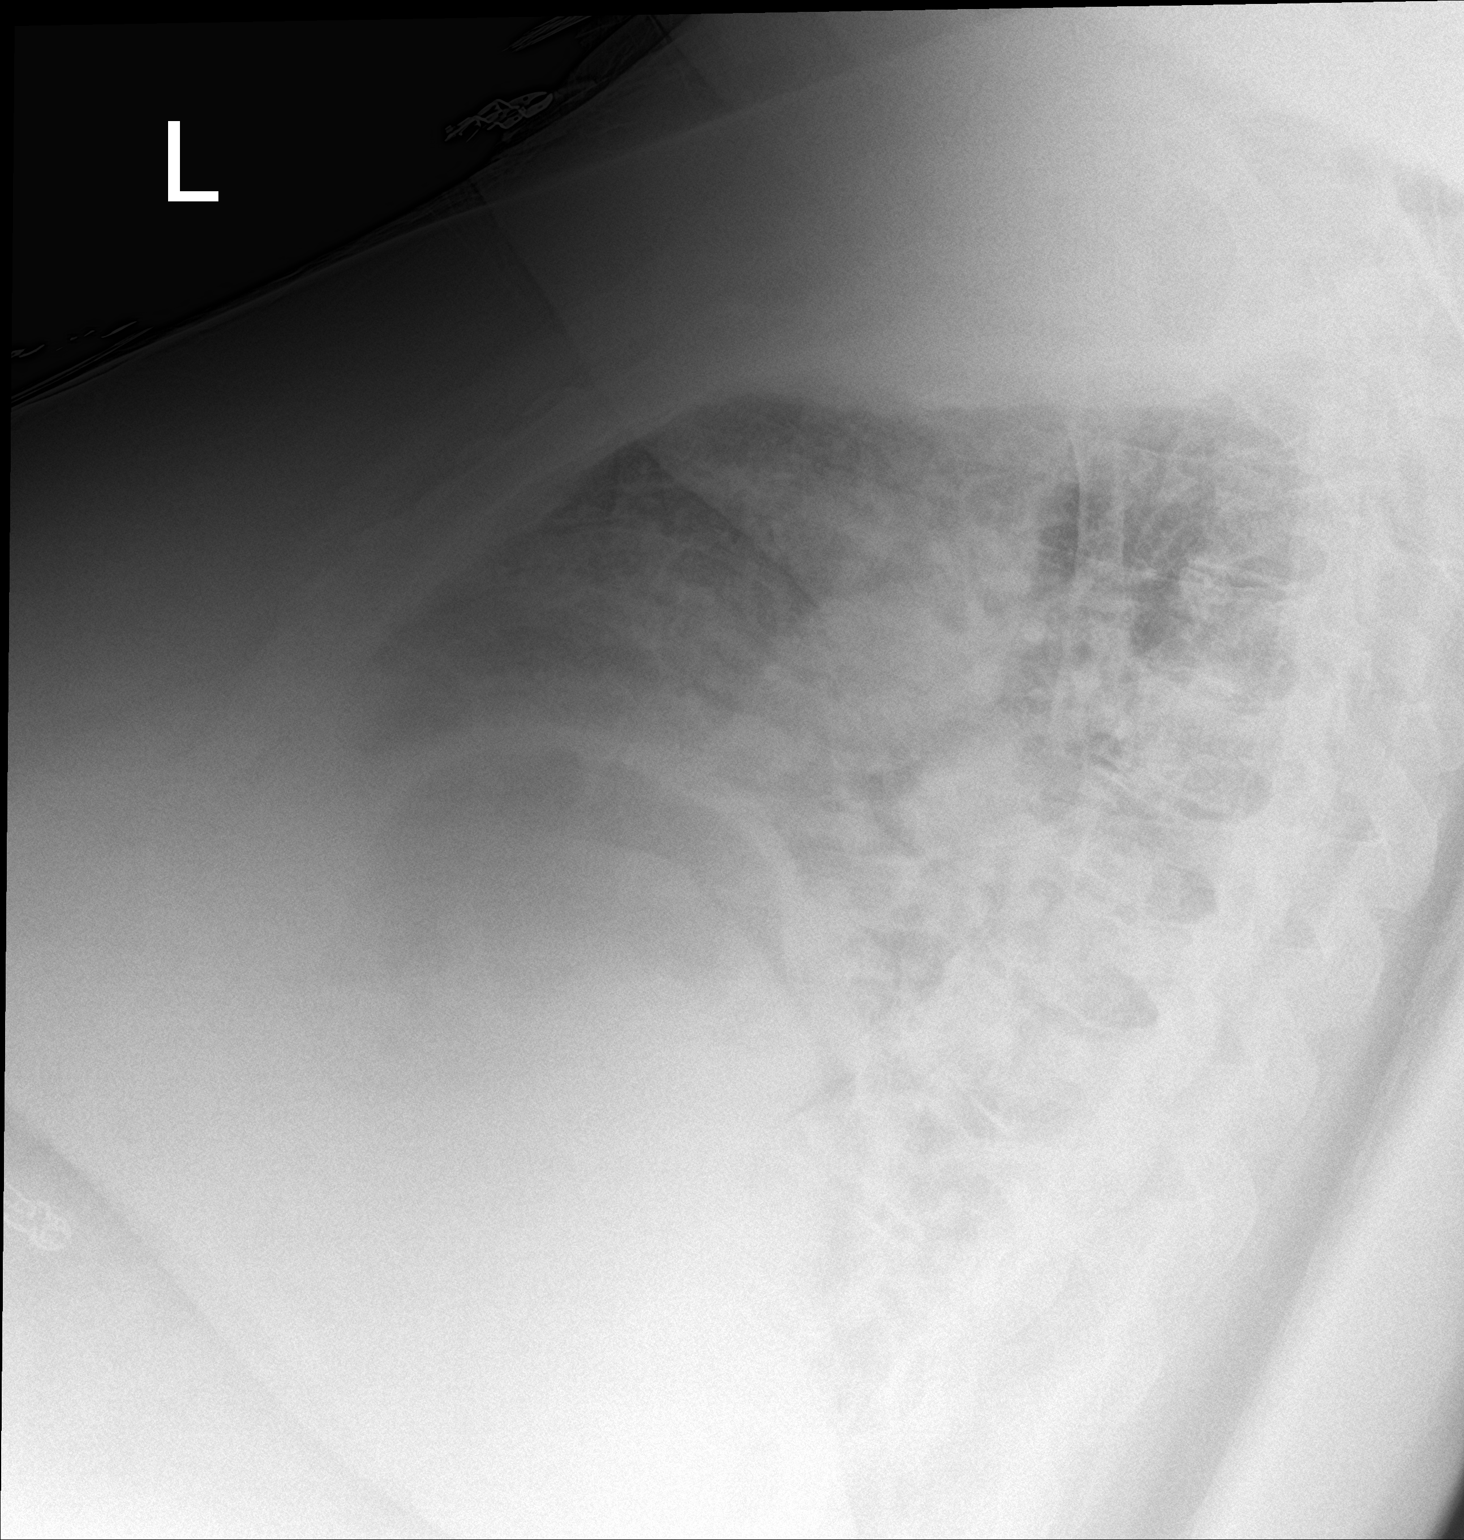

[chest ap]
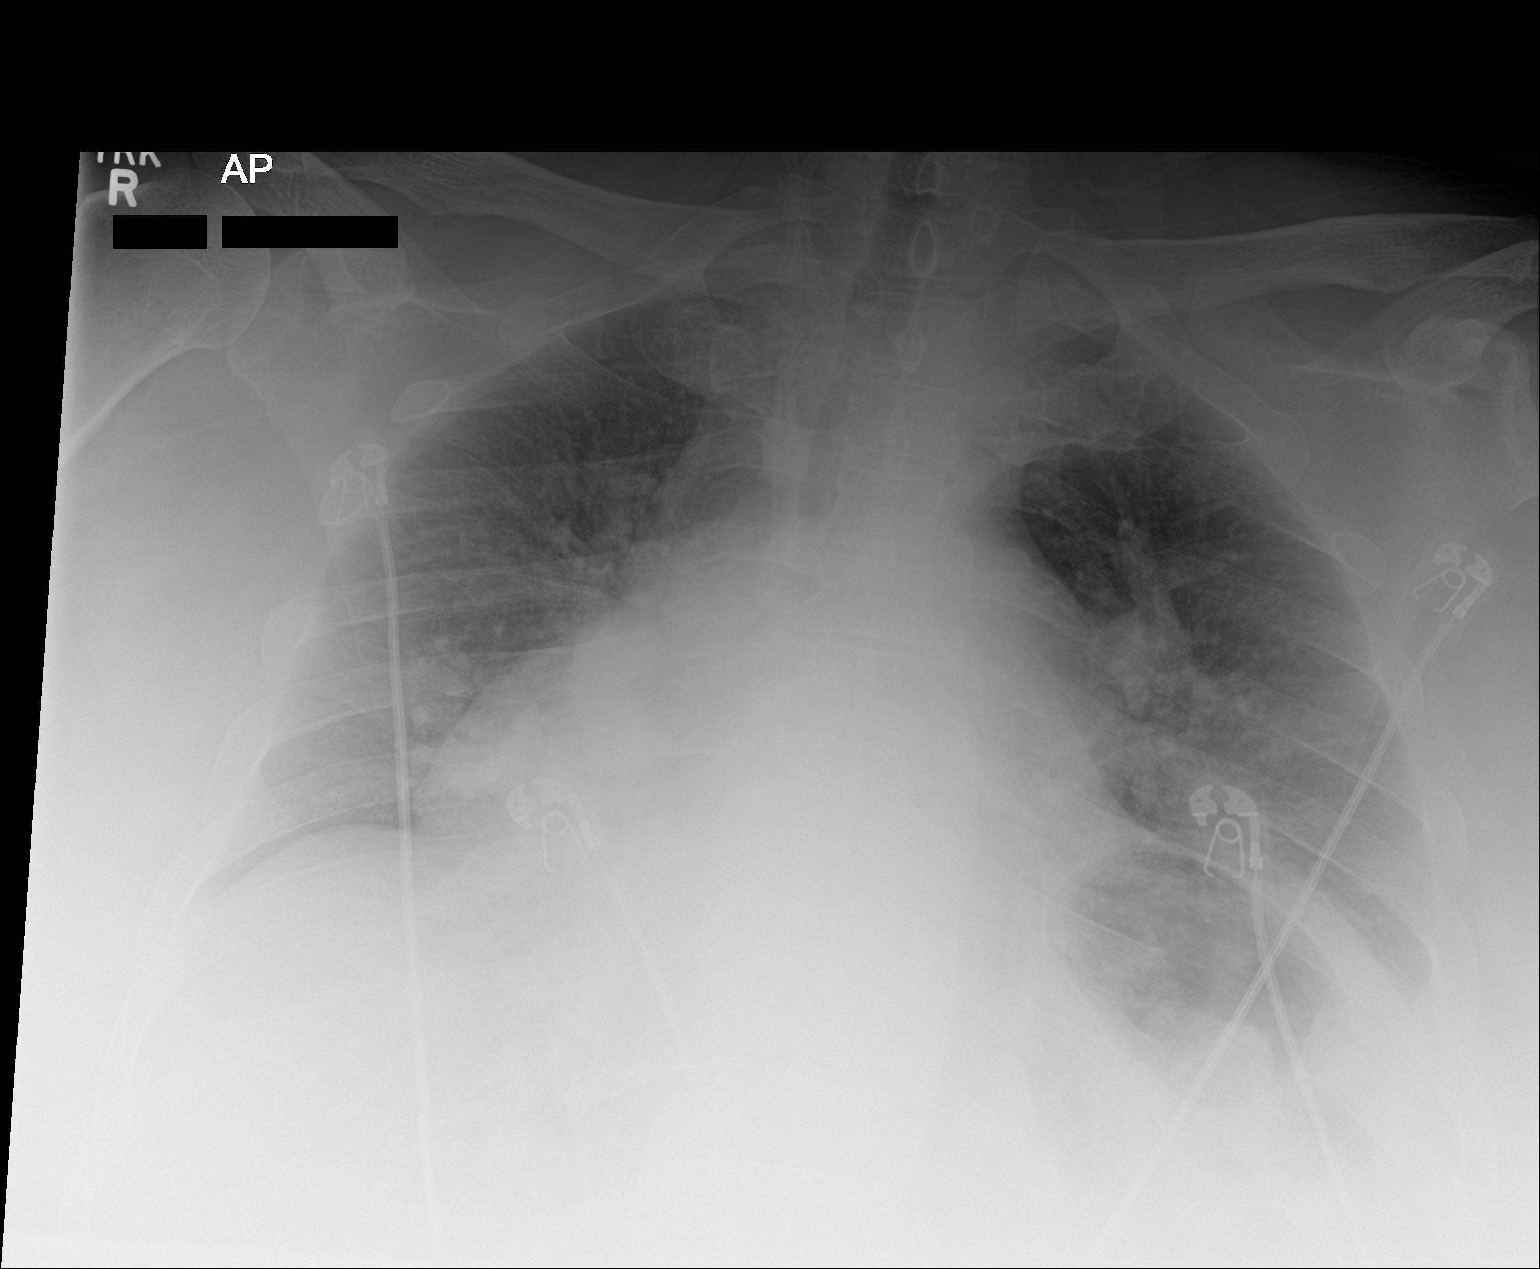

[2 of 2 positions shown; findings below may reference images not displayed]

FINDINGS: Today's study is obtained in a lordotic fashion. The lungs are
reasonably well inflated. The cardiac silhouette is enlarged. The
central pulmonary vascularity is engorged and there is mild
cephalization. There is no definite pleural effusion. On the lateral
view increased density projects over the lower thoracic spine
worrisome for pneumonia.
IMPRESSION: Limited study due to patient positioning. CHF with mild pulmonary
vascular prominence. Probable left lower lobe pneumonia. Follow-up
radiographs following anticipated antibiotic therapy are recommended
to assure clearing.

## 2018-04-27 DIAGNOSIS — G4733 Obstructive sleep apnea (adult) (pediatric): Secondary | ICD-10-CM | POA: Diagnosis not present

## 2018-04-28 ENCOUNTER — Encounter: Payer: Self-pay | Admitting: Family Medicine

## 2018-04-28 DIAGNOSIS — H40013 Open angle with borderline findings, low risk, bilateral: Secondary | ICD-10-CM | POA: Diagnosis not present

## 2018-04-28 DIAGNOSIS — H10413 Chronic giant papillary conjunctivitis, bilateral: Secondary | ICD-10-CM | POA: Diagnosis not present

## 2018-04-28 DIAGNOSIS — E119 Type 2 diabetes mellitus without complications: Secondary | ICD-10-CM | POA: Diagnosis not present

## 2018-04-28 DIAGNOSIS — H2513 Age-related nuclear cataract, bilateral: Secondary | ICD-10-CM | POA: Diagnosis not present

## 2018-04-28 LAB — HM DIABETES EYE EXAM

## 2018-05-04 ENCOUNTER — Telehealth (INDEPENDENT_AMBULATORY_CARE_PROVIDER_SITE_OTHER): Payer: Self-pay

## 2018-05-08 ENCOUNTER — Other Ambulatory Visit: Payer: Self-pay | Admitting: Family Medicine

## 2018-05-28 DIAGNOSIS — G4733 Obstructive sleep apnea (adult) (pediatric): Secondary | ICD-10-CM | POA: Diagnosis not present

## 2018-06-02 DIAGNOSIS — E119 Type 2 diabetes mellitus without complications: Secondary | ICD-10-CM | POA: Diagnosis not present

## 2018-06-02 DIAGNOSIS — H10413 Chronic giant papillary conjunctivitis, bilateral: Secondary | ICD-10-CM | POA: Diagnosis not present

## 2018-06-02 DIAGNOSIS — H2513 Age-related nuclear cataract, bilateral: Secondary | ICD-10-CM | POA: Diagnosis not present

## 2018-06-02 DIAGNOSIS — H40013 Open angle with borderline findings, low risk, bilateral: Secondary | ICD-10-CM | POA: Diagnosis not present

## 2018-06-09 DIAGNOSIS — I5041 Acute combined systolic (congestive) and diastolic (congestive) heart failure: Secondary | ICD-10-CM | POA: Diagnosis not present

## 2018-06-09 DIAGNOSIS — M109 Gout, unspecified: Secondary | ICD-10-CM | POA: Diagnosis not present

## 2018-06-09 DIAGNOSIS — I10 Essential (primary) hypertension: Secondary | ICD-10-CM | POA: Diagnosis not present

## 2018-06-09 DIAGNOSIS — N189 Chronic kidney disease, unspecified: Secondary | ICD-10-CM | POA: Diagnosis not present

## 2018-06-09 DIAGNOSIS — E785 Hyperlipidemia, unspecified: Secondary | ICD-10-CM | POA: Diagnosis not present

## 2018-06-09 DIAGNOSIS — G4733 Obstructive sleep apnea (adult) (pediatric): Secondary | ICD-10-CM | POA: Diagnosis not present

## 2018-06-19 ENCOUNTER — Other Ambulatory Visit: Payer: Self-pay | Admitting: Pharmacist

## 2018-06-19 DIAGNOSIS — E669 Obesity, unspecified: Principal | ICD-10-CM

## 2018-06-19 DIAGNOSIS — E1169 Type 2 diabetes mellitus with other specified complication: Secondary | ICD-10-CM

## 2018-06-19 MED ORDER — GLUCOSE BLOOD VI STRP: ORAL_STRIP | 11 refills | Status: AC

## 2018-06-19 MED ORDER — ACCU-CHEK AVIVA DEVI: 0 refills | Status: AC

## 2018-06-19 MED ORDER — ACCU-CHEK SOFTCLIX LANCETS MISC: 11 refills | Status: AC

## 2018-06-25 DIAGNOSIS — G4733 Obstructive sleep apnea (adult) (pediatric): Secondary | ICD-10-CM | POA: Diagnosis not present

## 2018-06-26 ENCOUNTER — Other Ambulatory Visit: Payer: Self-pay | Admitting: Family Medicine

## 2018-06-26 DIAGNOSIS — E1169 Type 2 diabetes mellitus with other specified complication: Secondary | ICD-10-CM

## 2018-06-26 DIAGNOSIS — E669 Obesity, unspecified: Principal | ICD-10-CM

## 2018-06-28 DIAGNOSIS — G4733 Obstructive sleep apnea (adult) (pediatric): Secondary | ICD-10-CM | POA: Diagnosis not present

## 2018-06-30 ENCOUNTER — Ambulatory Visit: Payer: Medicare HMO | Attending: Family Medicine | Admitting: Family Medicine

## 2018-06-30 ENCOUNTER — Encounter: Payer: Self-pay | Admitting: Family Medicine

## 2018-06-30 VITALS — BP 140/100 | HR 59 | Temp 97.8°F | Ht 72.0 in | Wt 342.0 lb

## 2018-06-30 DIAGNOSIS — I5042 Chronic combined systolic (congestive) and diastolic (congestive) heart failure: Secondary | ICD-10-CM | POA: Diagnosis not present

## 2018-06-30 DIAGNOSIS — E669 Obesity, unspecified: Secondary | ICD-10-CM | POA: Diagnosis not present

## 2018-06-30 DIAGNOSIS — J438 Other emphysema: Secondary | ICD-10-CM

## 2018-06-30 DIAGNOSIS — E1169 Type 2 diabetes mellitus with other specified complication: Secondary | ICD-10-CM

## 2018-06-30 DIAGNOSIS — M1A00X0 Idiopathic chronic gout, unspecified site, without tophus (tophi): Secondary | ICD-10-CM | POA: Diagnosis not present

## 2018-06-30 DIAGNOSIS — I10 Essential (primary) hypertension: Secondary | ICD-10-CM

## 2018-06-30 DIAGNOSIS — Z6841 Body Mass Index (BMI) 40.0 and over, adult: Secondary | ICD-10-CM

## 2018-06-30 LAB — POCT GLYCOSYLATED HEMOGLOBIN (HGB A1C): HBA1C, POC (CONTROLLED DIABETIC RANGE): 6.1 % (ref 0.0–7.0)

## 2018-06-30 MED ORDER — CARVEDILOL 6.25 MG PO TABS: 6.2500 mg | ORAL_TABLET | Freq: Two times a day (BID) | ORAL | 1 refills | Status: DC

## 2018-06-30 MED ORDER — ISOSORBIDE MONONITRATE ER 30 MG PO TB24: 30.0000 mg | ORAL_TABLET | Freq: Every day | ORAL | 1 refills | Status: DC

## 2018-06-30 MED ORDER — ALBUTEROL SULFATE HFA 108 (90 BASE) MCG/ACT IN AERS: 2.0000 | INHALATION_SPRAY | Freq: Four times a day (QID) | RESPIRATORY_TRACT | 6 refills | Status: DC | PRN

## 2018-06-30 MED ORDER — HYDRALAZINE HCL 25 MG PO TABS: 25.0000 mg | ORAL_TABLET | Freq: Three times a day (TID) | ORAL | 6 refills | Status: DC

## 2018-06-30 MED ORDER — FLUTICASONE-SALMETEROL 250-50 MCG/DOSE IN AEPB: 1.0000 | INHALATION_SPRAY | Freq: Two times a day (BID) | RESPIRATORY_TRACT | 6 refills | Status: DC

## 2018-06-30 MED ORDER — BUMETANIDE 2 MG PO TABS: 2.0000 mg | ORAL_TABLET | Freq: Two times a day (BID) | ORAL | 2 refills | Status: DC

## 2018-06-30 MED ORDER — ALLOPURINOL 100 MG PO TABS: 100.0000 mg | ORAL_TABLET | Freq: Every day | ORAL | 1 refills | Status: DC

## 2018-06-30 MED ORDER — LISINOPRIL 20 MG PO TABS: 20.0000 mg | ORAL_TABLET | Freq: Every day | ORAL | 1 refills | Status: DC

## 2018-06-30 NOTE — Patient Instructions (Signed)

## 2018-06-30 NOTE — Progress Notes (Signed)
Subjective:  Patient ID: Adam Dennis, male    DOB: [birth year]  Age: 58 y.o. MRN: [id number]  CC: Diabetes   HPI Adam Dennis  is a 58 year old male with a history of hypertension, stage III- IV chronic kidney disease, obstructive sleep apnea (on CPAP at night), type 2 diabetes mellitus (A1c 6.1), combined systolic and diastolic CHF (EF 52-84% from TEE of 03/2017) followed by cardiology, obesity hypoventilation syndrome previously on 4 L of oxygen for chronic respiratory failure ( no longer requires oxygen) who presents today for a follow-up visit. His diabetes is diet-controlled and he denies hypoglycemia or numbness in extremities. His weight today is 342 pounds however he states his weight was 335 pounds at home. He has joined the gym and has been consistent for the last 2 weeks with the aim of losing weight.  Chronic kidney disease is managed by nephrology and he has an upcoming appointment next week. His CHF is managed by cardiology-Dr. Terrence Dupont.  He denies chest pains, shortness of breath, pedal edema. Emphysema has been stable with no recent flare. He has no additional concerns today.  Past Medical History:  Diagnosis Date  . Arthritis    "back" (02/06/2017)  . CHF (congestive heart failure) ([place])    new onset /Encounter Date 09/12/2006  . Chronic combined systolic and diastolic heart failure ([place])   . CKD (chronic kidney disease) stage 4, GFR 15-29 ml/min (HCC)   . Glaucoma, right eye   . High cholesterol   . Hypertension   . Hypertrophy of tonsils alone   . Myocardial infarction [facility])    "small one; ~ 2008" (02/06/2017)  . Obesity, unspecified   . On home oxygen therapy    "4L; 24/7" (04/10/2017)  . OSA on CPAP   . Type II diabetes mellitus ([place])     Past Surgical History:  Procedure Laterality Date  . HERNIA REPAIR    . TEE WITHOUT CARDIOVERSION N/A 04/16/2017   Procedure: TRANSESOPHAGEAL ECHOCARDIOGRAM (TEE);  Surgeon: [name], MD;  Location: [facility]  ENDOSCOPY;  Service: Cardiovascular;  Laterality: N/A;  . [place]   "when I was little baby"    No Known Allergies   Outpatient Medications Prior to Visit  Medication Sig Dispense Refill  . ACCU-CHEK SOFTCLIX LANCETS lancets Use as instructed to check blood sugar up to 3 times daily. 100 each 11  . amLODipine (NORVASC) 10 MG tablet Take 10 mg daily by mouth.  0  . aspirin 325 MG tablet Take 325 mg by mouth daily.    Marland Kitchen atorvastatin (LIPITOR) 40 MG tablet TAKE 1 TABLET(40 MG) BY MOUTH DAILY 90 tablet 0  . Blood Glucose Monitoring Suppl (ACCU-CHEK AVIVA) device Use as instructed to check blood sugar up to 3 times daily. 1 each 0  . calcium carbonate (TUMS - DOSED IN MG ELEMENTAL CALCIUM) 500 MG chewable tablet Chew 1 tablet (200 mg of elemental calcium total) by mouth 2 (two) times daily.    Marland Kitchen glucose blood (ACCU-CHEK AVIVA PLUS) test strip Use as instructed to check blood sugar up to 3 times daily. 100 each 11  . Lancet Devices (ACCU-CHEK SOFTCLIX) lancets Use as instructed daily. 1 each 5  . nitroGLYCERIN (NITROSTAT) 0.4 MG SL tablet Place 1 tablet (0.4 mg total) under the tongue every 5 (five) minutes x 3 doses as needed for chest pain. 30 tablet 1  . TRAVATAN Z 0.004 % SOLN ophthalmic solution Place 1 drop into both eyes at bedtime.  12  . albuterol (PROVENTIL HFA;VENTOLIN HFA) 108 (90 Base) MCG/ACT inhaler Inhale 2 puffs into the lungs every 6 (six) hours as needed for wheezing or shortness of breath. 1 Inhaler 6  . allopurinol (ZYLOPRIM) 100 MG tablet Take 1 tablet (100 mg total) by mouth daily. 90 tablet 1  . bumetanide (BUMEX) 2 MG tablet TAKE 1 TABLET BY MOUTH TWICE DAILY 60 tablet 2  . carvedilol (COREG) 6.25 MG tablet Take 1 tablet (6.25 mg total) by mouth 2 (two) times daily. 180 tablet 1  . Fluticasone-Salmeterol (ADVAIR DISKUS) 250-50 MCG/DOSE AEPB Inhale 1 puff into the lungs 2 (two) times daily. 1 each 6  . hydrALAZINE (APRESOLINE) 25 MG tablet Take 1  tablet (25 mg total) by mouth 3 (three) times daily. 90 tablet 6  . isosorbide mononitrate (IMDUR) 30 MG 24 hr tablet Take 1 tablet (30 mg total) by mouth daily. 90 tablet 1  . lisinopril (PRINIVIL,ZESTRIL) 20 MG tablet Take 1 tablet (20 mg total) by mouth daily. 90 tablet 1   No facility-administered medications prior to visit.     ROS Review of Systems  Constitutional: Negative for activity change and appetite change.  HENT: Negative for sinus pressure and sore throat.   Eyes: Negative for visual disturbance.  Respiratory: Negative for cough, chest tightness and shortness of breath.   Cardiovascular: Negative for chest pain and leg swelling.  Gastrointestinal: Negative for abdominal distention, abdominal pain, constipation and diarrhea.  Endocrine: Negative.   Genitourinary: Negative for dysuria.  Musculoskeletal: Negative for joint swelling and myalgias.  Skin: Negative for rash.  Allergic/Immunologic: Negative.   Neurological: Negative for weakness, light-headedness and numbness.  Psychiatric/Behavioral: Negative for dysphoric mood and suicidal ideas.    Objective:  BP (!) 152/102   Pulse (!) 59   Temp 97.8 F (36.6 C) (Oral)   Ht 6' (1.829 m)   Wt (!) 342 lb (155.1 kg)   SpO2 96%   BMI 46.38 kg/m   BP/Weight 06/30/2018 03/04/2018 3/71/0626  Systolic BP 948 546 270  Diastolic BP 350 87 82  Wt. (Lbs) 342 334.6 330  BMI 46.38 45.38 44.76      Physical Exam Constitutional:      Appearance: He is well-developed. He is obese.  Cardiovascular:     Rate and Rhythm: Normal rate.     Heart sounds: Normal heart sounds. No murmur.  Pulmonary:     Effort: Pulmonary effort is normal.     Breath sounds: Normal breath sounds. No wheezing or rales.  Chest:     Chest wall: No tenderness.  Abdominal:     General: Bowel sounds are normal. There is no distension.     Palpations: Abdomen is soft. There is no mass.     Tenderness: There is no abdominal tenderness.    Musculoskeletal: Normal range of motion.  Neurological:     Mental Status: He is alert and oriented to person, place, and time.  Psychiatric:        Mood and Affect: Mood normal.        Behavior: Behavior normal.      CMP Latest Ref Rng & Units 03/04/2018 08/14/2017 04/28/2017  Glucose 65 - 99 mg/dL 82 83 87  BUN 6 - 24 mg/dL 38(H) 34(H) 25(H)  Creatinine 0.76 - 1.27 mg/dL 3.03(H) 3.21(H) 2.84(H)  Sodium 134 - 144 mmol/L 145(H) 143 140  Potassium 3.5 - 5.2 mmol/L 4.9 4.5 3.6  Chloride 96 - 106 mmol/L 104 104 100  CO2 20 -  29 mmol/L 26 25 29   Calcium 8.7 - 10.2 mg/dL 9.0 8.7 9.4  Total Protein 6.0 - 8.5 g/dL - 7.2 -  Total Bilirubin 0.0 - 1.2 mg/dL - 0.7 -  Alkaline Phos 39 - 117 IU/L - 60 -  AST 0 - 40 IU/L - 16 -  ALT 0 - 44 IU/L - 9 -    Lipid Panel     Component Value Date/Time   CHOL 129 08/14/2017 0956   TRIG 63 08/14/2017 0956   HDL 43 08/14/2017 0956   CHOLHDL 3.0 08/14/2017 0956   CHOLHDL 2.8 10/07/2014 1547   VLDL 13 10/07/2014 1547   LDLCALC 73 08/14/2017 0956    Lab Results  Component Value Date   HGBA1C 6.1 06/30/2018     Assessment & Plan:   1. Diabetes mellitus type 2 in obese ([place]) Diet controlled with A1c of 6.1 - POCT glycosylated hemoglobin (Hb A1C) - Microalbumin/Creatinine Ratio, Urine; Future  2. Other emphysema (HCC) Stable - albuterol (PROVENTIL HFA;VENTOLIN HFA) 108 (90 Base) MCG/ACT inhaler; Inhale 2 puffs into the lungs every 6 (six) hours as needed for wheezing or shortness of breath.  Dispense: 1 Inhaler; Refill: 6 - Fluticasone-Salmeterol (ADVAIR DISKUS) 250-50 MCG/DOSE AEPB; Inhale 1 puff into the lungs 2 (two) times daily.  Dispense: 1 each; Refill: 6  3. Essential hypertension, benign Slight diastolic elevation No regimen change at this time Counseled on blood pressure goal of less than 130/80, low-sodium, DASH diet, medication compliance, 150 minutes of moderate intensity exercise per week. Discussed medication compliance,  adverse effects. - isosorbide mononitrate (IMDUR) 30 MG 24 hr tablet; Take 1 tablet (30 mg total) by mouth daily.  Dispense: 90 tablet; Refill: 1 - hydrALAZINE (APRESOLINE) 25 MG tablet; Take 1 tablet (25 mg total) by mouth 3 (three) times daily.  Dispense: 90 tablet; Refill: 6  4. Chronic combined systolic and diastolic CHF (congestive heart failure) ([place]) Euvolemic Labs ordered and will fax to his cardiologist - Dr Terrence Dupont at [phone] - Lipid panel; Future - CMP14+EGFR; Future - Brain natriuretic peptide; Future - bumetanide (BUMEX) 2 MG tablet; Take 1 tablet (2 mg total) by mouth 2 (two) times daily.  Dispense: 60 tablet; Refill: 2 - carvedilol (COREG) 6.25 MG tablet; Take 1 tablet (6.25 mg total) by mouth 2 (two) times daily.  Dispense: 180 tablet; Refill: 1 - isosorbide mononitrate (IMDUR) 30 MG 24 hr tablet; Take 1 tablet (30 mg total) by mouth daily.  Dispense: 90 tablet; Refill: 1 - hydrALAZINE (APRESOLINE) 25 MG tablet; Take 1 tablet (25 mg total) by mouth 3 (three) times daily.  Dispense: 90 tablet; Refill: 6  5. Idiopathic chronic gout without tophus, unspecified site No recent flares - Uric Acid; Future - allopurinol (ZYLOPRIM) 100 MG tablet; Take 1 tablet (100 mg total) by mouth daily.  Dispense: 90 tablet; Refill: 1   Meds ordered this encounter  Medications  . allopurinol (ZYLOPRIM) 100 MG tablet    Sig: Take 1 tablet (100 mg total) by mouth daily.    Dispense:  90 tablet    Refill:  1    **Patient requests 90 days supply**  . albuterol (PROVENTIL HFA;VENTOLIN HFA) 108 (90 Base) MCG/ACT inhaler    Sig: Inhale 2 puffs into the lungs every 6 (six) hours as needed for wheezing or shortness of breath.    Dispense:  1 Inhaler    Refill:  6  . bumetanide (BUMEX) 2 MG tablet    Sig: Take 1 tablet (2 mg total)  by mouth 2 (two) times daily.    Dispense:  60 tablet    Refill:  2  . carvedilol (COREG) 6.25 MG tablet    Sig: Take 1 tablet (6.25 mg total) by mouth 2 (two)  times daily.    Dispense:  180 tablet    Refill:  1  . Fluticasone-Salmeterol (ADVAIR DISKUS) 250-50 MCG/DOSE AEPB    Sig: Inhale 1 puff into the lungs 2 (two) times daily.    Dispense:  1 each    Refill:  6  . isosorbide mononitrate (IMDUR) 30 MG 24 hr tablet    Sig: Take 1 tablet (30 mg total) by mouth daily.    Dispense:  90 tablet    Refill:  1  . hydrALAZINE (APRESOLINE) 25 MG tablet    Sig: Take 1 tablet (25 mg total) by mouth 3 (three) times daily.    Dispense:  90 tablet    Refill:  6  . lisinopril (PRINIVIL,ZESTRIL) 20 MG tablet    Sig: Take 1 tablet (20 mg total) by mouth daily.    Dispense:  90 tablet    Refill:  1    Follow-up: Return in about 3 months (around 09/28/2018) for follow up of chronic medical conditions.   Charlott Rakes MD

## 2018-07-01 ENCOUNTER — Ambulatory Visit: Payer: Medicare HMO | Attending: Family Medicine

## 2018-07-01 DIAGNOSIS — E669 Obesity, unspecified: Secondary | ICD-10-CM | POA: Diagnosis not present

## 2018-07-01 DIAGNOSIS — E1169 Type 2 diabetes mellitus with other specified complication: Secondary | ICD-10-CM | POA: Diagnosis not present

## 2018-07-01 DIAGNOSIS — I5042 Chronic combined systolic (congestive) and diastolic (congestive) heart failure: Secondary | ICD-10-CM

## 2018-07-01 DIAGNOSIS — M1A00X0 Idiopathic chronic gout, unspecified site, without tophus (tophi): Secondary | ICD-10-CM | POA: Diagnosis not present

## 2018-07-02 ENCOUNTER — Ambulatory Visit: Payer: Medicare HMO | Admitting: Adult Health

## 2018-07-02 LAB — MICROALBUMIN / CREATININE URINE RATIO
Creatinine, Urine: 23.5 mg/dL
Microalb/Creat Ratio: 220 mg/g creat — ABNORMAL HIGH (ref 0–29)
Microalbumin, Urine: 51.6 ug/mL

## 2018-07-02 LAB — LIPID PANEL
Chol/HDL Ratio: 2.2 ratio (ref 0.0–5.0)
Cholesterol, Total: 112 mg/dL (ref 100–199)
HDL: 52 mg/dL (ref >39)
LDL Calculated: 49 mg/dL (ref 0–99)
Triglycerides: 56 mg/dL (ref 0–149)
VLDL Cholesterol Cal: 11 mg/dL (ref 5–40)

## 2018-07-02 LAB — BRAIN NATRIURETIC PEPTIDE: BNP: 67.1 pg/mL (ref 0.0–100.0)

## 2018-07-02 LAB — CMP14+EGFR
ALT: 18 IU/L (ref 0–44)
AST: 23 IU/L (ref 0–40)
Albumin/Globulin Ratio: 1 — ABNORMAL LOW (ref 1.2–2.2)
Albumin: 3.5 g/dL — ABNORMAL LOW (ref 3.8–4.9)
Alkaline Phosphatase: 49 IU/L (ref 39–117)
BUN/Creatinine Ratio: 10 (ref 9–20)
BUN: 32 mg/dL — ABNORMAL HIGH (ref 6–24)
Bilirubin Total: 0.5 mg/dL (ref 0.0–1.2)
CALCIUM: 8.4 mg/dL — AB (ref 8.7–10.2)
CO2: 26 mmol/L (ref 20–29)
Chloride: 101 mmol/L (ref 96–106)
Creatinine, Ser: 3.14 mg/dL — ABNORMAL HIGH (ref 0.76–1.27)
GFR calc Af Amer: 24 mL/min/{1.73_m2} — ABNORMAL LOW (ref >59)
GFR, EST NON AFRICAN AMERICAN: 21 mL/min/{1.73_m2} — AB (ref >59)
Globulin, Total: 3.4 g/dL (ref 1.5–4.5)
Glucose: 84 mg/dL (ref 65–99)
Potassium: 4.6 mmol/L (ref 3.5–5.2)
Sodium: 142 mmol/L (ref 134–144)
Total Protein: 6.9 g/dL (ref 6.0–8.5)

## 2018-07-02 LAB — URIC ACID: Uric Acid: 6.4 mg/dL (ref 3.7–8.6)

## 2018-07-03 ENCOUNTER — Telehealth: Payer: Self-pay

## 2018-07-03 NOTE — Telephone Encounter (Signed)
Patient was called and informed of lab results. 

## 2018-07-03 NOTE — Telephone Encounter (Signed)
-----   Message from Charlott Rakes, MD sent at 07/03/2018 12:25 PM EST ----- Labs reveal normal cholesterol, uric acid which is a marker of gout flare is normal.  Kidney function shows a decline and I will need him to with nephrology.

## 2018-07-06 ENCOUNTER — Ambulatory Visit (INDEPENDENT_AMBULATORY_CARE_PROVIDER_SITE_OTHER): Payer: Medicare HMO | Admitting: Adult Health

## 2018-07-06 ENCOUNTER — Encounter: Payer: Self-pay | Admitting: Adult Health

## 2018-07-06 DIAGNOSIS — G4733 Obstructive sleep apnea (adult) (pediatric): Secondary | ICD-10-CM

## 2018-07-06 DIAGNOSIS — E662 Morbid (severe) obesity with alveolar hypoventilation: Secondary | ICD-10-CM | POA: Diagnosis not present

## 2018-07-06 NOTE — Assessment & Plan Note (Signed)
Doing well on CPAP   Plan  Patient Instructions  Continue on CPAP At bedtime  .  Keep up good work  Work on [employer]  Do not drive if sleepy .  Follow up with Dr. Elsworth Soho  In 1 year and As needed     '

## 2018-07-06 NOTE — Assessment & Plan Note (Signed)
Excellent control and compliance on CPAP  Plan  Patient Instructions  Continue on CPAP At bedtime  .  Keep up good work  Work on [employer]  Do not drive if sleepy .  Follow up with Dr. Elsworth Soho  In 1 year and As needed

## 2018-07-06 NOTE — Progress Notes (Signed)
@Patient  ID: Adam Dennis, male    DOB: [birth year], 58 y.o.   MRN: [id number]  Chief Complaint  Patient presents with  . Follow-up    OSA     Referring provider: Charlott Rakes, MD  HPI: 58 year old morbidly obese never smoker  man followed for  very severe obstructive sleep apnea/obesity hypoventilation syndrome. Medical history significant for CHF      TEST/EVENTS :  NSPG 09/2006 was reviewed which showed severe OSA with AHI 161/hour and lowest desaturation of 58%. His weight then was around 330 pounds   PFTs 01/2017 showed ratio of 85, FVC 28%, TLC 43% and DLCO of 35% and corrects for alveolar volume suggesting severe restriction likely due to obesity rather than intraparenchymal cause  Hospitalized November 2018 for acute hypercarbic respiratory failure (PCO2 80).  Discharged on trilogy vent.   CPAP titration 06/2017 >> 15 cm H2O, NO oxygen needed  07/06/2018 Follow up : OSA/OHS Patient returns for a 10-month follow-up.  Patient has underlying obstructive sleep apnea and OHS.  He is on CPAP at bedtime.  Patient says he is doing very well on CPAP.  He feels rested and feels he benefits from CPAP.  Says overall he is doing very well.  He denies any significant daytime sleepiness.  He uses a full facemask.  Patient is on CPAP 15 cm H2O download shows excellent compliance with 100% usage.  Daily average usage at 6.5 hours.  AHI 2.3.  No Known Allergies  Immunization History  Administered Date(s) Administered  . Influenza Whole 06/02/2009  . Influenza,inj,Quad PF,6+ Mos 02/15/2013, 02/10/2015, 02/07/2017  . Pneumococcal Polysaccharide-23 12/13/2012  . Tdap 06/26/2015    Past Medical History:  Diagnosis Date  . Arthritis    "back" (02/06/2017)  . CHF (congestive heart failure) ([place])    new onset /Encounter Date 09/12/2006  . Chronic combined systolic and diastolic heart failure ([place])   . CKD (chronic kidney disease) stage 4, GFR 15-29 ml/min (HCC)   . Glaucoma, right  eye   . High cholesterol   . Hypertension   . Hypertrophy of tonsils alone   . Myocardial infarction [facility])    "small one; ~ 2008" (02/06/2017)  . Obesity, unspecified   . On home oxygen therapy    "4L; 24/7" (04/10/2017)  . OSA on CPAP   . Type II diabetes mellitus (HCC)     Tobacco History: Social History   Tobacco Use  Smoking Status Never Smoker  Smokeless Tobacco Never Used   Counseling given: Not Answered   Outpatient Medications Prior to Visit  Medication Sig Dispense Refill  . ACCU-CHEK SOFTCLIX LANCETS lancets Use as instructed to check blood sugar up to 3 times daily. 100 each 11  . albuterol (PROVENTIL HFA;VENTOLIN HFA) 108 (90 Base) MCG/ACT inhaler Inhale 2 puffs into the lungs every 6 (six) hours as needed for wheezing or shortness of breath. 1 Inhaler 6  . allopurinol (ZYLOPRIM) 100 MG tablet Take 1 tablet (100 mg total) by mouth daily. 90 tablet 1  . amLODipine (NORVASC) 10 MG tablet Take 10 mg daily by mouth.  0  . aspirin 325 MG tablet Take 325 mg by mouth daily.    Marland Kitchen atorvastatin (LIPITOR) 40 MG tablet TAKE 1 TABLET(40 MG) BY MOUTH DAILY 90 tablet 0  . Blood Glucose Monitoring Suppl (ACCU-CHEK AVIVA) device Use as instructed to check blood sugar up to 3 times daily. 1 each 0  . bumetanide (BUMEX) 2 MG tablet Take 1 tablet (2 mg total)  by mouth 2 (two) times daily. 60 tablet 2  . calcium carbonate (TUMS - DOSED IN MG ELEMENTAL CALCIUM) 500 MG chewable tablet Chew 1 tablet (200 mg of elemental calcium total) by mouth 2 (two) times daily.    . carvedilol (COREG) 6.25 MG tablet Take 1 tablet (6.25 mg total) by mouth 2 (two) times daily. 180 tablet 1  . Cholecalciferol (VITAMIN D3) 50 MCG (2000 UT) TABS Take 1 tablet by mouth daily.    . ferrous sulfate 325 (65 FE) MG tablet Take 325 mg by mouth daily with breakfast.    . Fluticasone-Salmeterol (ADVAIR DISKUS) 250-50 MCG/DOSE AEPB Inhale 1 puff into the lungs 2 (two) times daily. 1 each 6  . glucose blood (ACCU-CHEK  AVIVA PLUS) test strip Use as instructed to check blood sugar up to 3 times daily. 100 each 11  . hydrALAZINE (APRESOLINE) 25 MG tablet Take 1 tablet (25 mg total) by mouth 3 (three) times daily. 90 tablet 6  . isosorbide mononitrate (IMDUR) 30 MG 24 hr tablet Take 1 tablet (30 mg total) by mouth daily. 90 tablet 1  . Lancet Devices (ACCU-CHEK SOFTCLIX) lancets Use as instructed daily. 1 each 5  . lisinopril (PRINIVIL,ZESTRIL) 20 MG tablet Take 1 tablet (20 mg total) by mouth daily. 90 tablet 1  . nitroGLYCERIN (NITROSTAT) 0.4 MG SL tablet Place 1 tablet (0.4 mg total) under the tongue every 5 (five) minutes x 3 doses as needed for chest pain. 30 tablet 1  . timolol (BETIMOL) 0.5 % ophthalmic solution Place 1 drop into both eyes daily.     . TRAVATAN Z 0.004 % SOLN ophthalmic solution Place 1 drop into both eyes at bedtime.  12   No facility-administered medications prior to visit.      Review of Systems:   Constitutional:   No  weight loss, night sweats,  Fevers, chills, fatigue, or  lassitude.  HEENT:   No headaches,  Difficulty swallowing,  Tooth/dental problems, or  Sore throat,                No sneezing, itching, ear ache, nasal congestion, post nasal drip,   CV:  No chest pain,  Orthopnea, PND, swelling in lower extremities, anasarca, dizziness, palpitations, syncope.   GI  No heartburn, indigestion, abdominal pain, nausea, vomiting, diarrhea, change in bowel habits, loss of appetite, bloody stools.   Resp: No shortness of breath with exertion or at rest.  No excess mucus, no productive cough,  No non-productive cough,  No coughing up of blood.  No change in color of mucus.  No wheezing.  No chest wall deformity  Skin: no rash or lesions.  GU: no dysuria, change in color of urine, no urgency or frequency.  No flank pain, no hematuria   MS:  No joint pain or swelling.  No decreased range of motion.  No back pain.    Physical Exam  BP (!) 144/84 (BP Location: Left Arm, Cuff  Size: Large)   Pulse 79   Ht 6' (1.829 m)   Wt (!) 345 lb (156.5 kg)   SpO2 92%   BMI 46.79 kg/m   GEN: A/Ox3; pleasant , NAD, morbidly obese   HEENT:  [place]/AT,  EACs-clear, TMs-wnl, NOSE-clear, THROAT-clear, no lesions, no postnasal drip or exudate noted.  Poor dentition  NECK:  Supple w/ fair ROM; no JVD; normal carotid impulses w/o bruits; no thyromegaly or nodules palpated; no lymphadenopathy.    RESP  Clear  P & A; w/o,  wheezes/ rales/ or rhonchi. no accessory muscle use, no dullness to percussion  CARD:  RRR, no m/r/g, trace peripheral edema, pulses intact, no cyanosis or clubbing.  GI:   Soft & nt; nml bowel sounds; no organomegaly or masses detected.   Musco: Warm bil, no deformities or joint swelling noted.   Neuro: alert, no focal deficits noted.    Skin: Warm, no lesions or rashes    Lab Results:  CBC   Imaging: No results found.    PFT Results Latest Ref Rng & Units 02/19/2017  FVC-Pre L 1.17  FVC-Predicted Pre % 28  FVC-Post L 1.21  FVC-Predicted Post % 29  Pre FEV1/FVC % % 85  Post FEV1/FCV % % 84  FEV1-Pre L 1.00  FEV1-Predicted Pre % 30  FEV1-Post L 1.03  DLCO UNC% % 35  DLCO COR %Predicted % 119  TLC L 3.02  TLC % Predicted % 43  RV % Predicted % 83    No results found for: NITRICOXIDE      Assessment & Plan:   OSA (obstructive sleep apnea) Excellent control and compliance on CPAP  Plan  Patient Instructions  Continue on CPAP At bedtime  .  Keep up good work  Work on [employer]  Do not drive if sleepy .  Follow up with Dr. Elsworth Soho  In 1 year and As needed        Morbid obesity ([place]) Wt loss   Obesity hypoventilation syndrome ([place]) Doing well on CPAP   Plan  Patient Instructions  Continue on CPAP At bedtime  .  Keep up good work  Work on [employer]  Do not drive if sleepy .  Follow up with Dr. Elsworth Soho  In 1 year and As needed     '      Mikeila Burgen, NP 07/06/2018

## 2018-07-06 NOTE — Assessment & Plan Note (Signed)
Wt loss  

## 2018-07-06 NOTE — Patient Instructions (Signed)
Continue on CPAP At bedtime .  ?Keep up good work.  ?Work on healthy weight .  ?Do not drive if sleepy  ?Follow up with Dr. Alva  In 1 year and As needed   ? ?

## 2018-07-08 DIAGNOSIS — E1121 Type 2 diabetes mellitus with diabetic nephropathy: Secondary | ICD-10-CM | POA: Diagnosis not present

## 2018-07-08 DIAGNOSIS — G4733 Obstructive sleep apnea (adult) (pediatric): Secondary | ICD-10-CM | POA: Diagnosis not present

## 2018-07-08 DIAGNOSIS — D631 Anemia in chronic kidney disease: Secondary | ICD-10-CM | POA: Diagnosis not present

## 2018-07-08 DIAGNOSIS — I5032 Chronic diastolic (congestive) heart failure: Secondary | ICD-10-CM | POA: Diagnosis not present

## 2018-07-08 DIAGNOSIS — N184 Chronic kidney disease, stage 4 (severe): Secondary | ICD-10-CM | POA: Diagnosis not present

## 2018-07-08 DIAGNOSIS — I129 Hypertensive chronic kidney disease with stage 1 through stage 4 chronic kidney disease, or unspecified chronic kidney disease: Secondary | ICD-10-CM | POA: Diagnosis not present

## 2018-07-08 DIAGNOSIS — N2581 Secondary hyperparathyroidism of renal origin: Secondary | ICD-10-CM | POA: Diagnosis not present

## 2018-07-09 ENCOUNTER — Ambulatory Visit: Payer: Medicare HMO | Admitting: Adult Health

## 2018-07-18 ENCOUNTER — Other Ambulatory Visit: Payer: Self-pay | Admitting: Family Medicine

## 2018-07-18 DIAGNOSIS — I5042 Chronic combined systolic (congestive) and diastolic (congestive) heart failure: Secondary | ICD-10-CM

## 2018-07-27 DIAGNOSIS — G4733 Obstructive sleep apnea (adult) (pediatric): Secondary | ICD-10-CM | POA: Diagnosis not present

## 2018-08-27 DIAGNOSIS — G4733 Obstructive sleep apnea (adult) (pediatric): Secondary | ICD-10-CM | POA: Diagnosis not present

## 2018-09-07 DIAGNOSIS — I5042 Chronic combined systolic (congestive) and diastolic (congestive) heart failure: Secondary | ICD-10-CM | POA: Diagnosis not present

## 2018-09-07 DIAGNOSIS — M109 Gout, unspecified: Secondary | ICD-10-CM | POA: Diagnosis not present

## 2018-09-07 DIAGNOSIS — I10 Essential (primary) hypertension: Secondary | ICD-10-CM | POA: Diagnosis not present

## 2018-09-07 DIAGNOSIS — E785 Hyperlipidemia, unspecified: Secondary | ICD-10-CM | POA: Diagnosis not present

## 2018-09-07 DIAGNOSIS — N189 Chronic kidney disease, unspecified: Secondary | ICD-10-CM | POA: Diagnosis not present

## 2018-09-07 DIAGNOSIS — G4733 Obstructive sleep apnea (adult) (pediatric): Secondary | ICD-10-CM | POA: Diagnosis not present

## 2018-09-17 ENCOUNTER — Ambulatory Visit: Payer: Medicare HMO | Attending: Family Medicine | Admitting: Physician Assistant

## 2018-09-17 ENCOUNTER — Other Ambulatory Visit: Payer: Self-pay

## 2018-09-17 VITALS — BP 106/70 | HR 69 | Temp 97.7°F | Resp 16 | Wt 343.0 lb

## 2018-09-17 DIAGNOSIS — Z6841 Body Mass Index (BMI) 40.0 and over, adult: Secondary | ICD-10-CM

## 2018-09-17 DIAGNOSIS — T7840XA Allergy, unspecified, initial encounter: Secondary | ICD-10-CM

## 2018-09-17 DIAGNOSIS — E669 Obesity, unspecified: Secondary | ICD-10-CM

## 2018-09-17 DIAGNOSIS — E1169 Type 2 diabetes mellitus with other specified complication: Secondary | ICD-10-CM | POA: Diagnosis not present

## 2018-09-17 LAB — GLUCOSE, POCT (MANUAL RESULT ENTRY): POC Glucose: 76 mg/dl (ref 70–99)

## 2018-09-17 MED ORDER — CETIRIZINE HCL 10 MG PO TABS: 10.0000 mg | ORAL_TABLET | Freq: Every day | ORAL | 11 refills | Status: DC

## 2018-09-17 MED FILL — CETIRIZINE HCL 10 MG TABS: 10 | 30 days supply | Qty: 30 | Fill #0

## 2018-09-17 NOTE — Progress Notes (Signed)
Patient ID: Adam Dennis, male   DOB: [birth year], 58 y.o.   MRN: [id number]   Adam Dennis, is a 58 y.o. male  MVE:[id number]  GEZ:[id number]  DOB - [birth year]  Subjective:  Chief Complaint and HPI: Adam Dennis is a 58 y.o. male here today with concerns of his bottom lip that was swollen yesterday.  It started after he and his wife went for a walk and his face brushed up against some bushes and trees.  Lower lip swelled and was itching.  Resolved after applying ice.  No new meds or foods.  No difficulty breathing or swallowing at the time.  His lip returned to normal after ice and has been normal since.  No swelling this morning.  Blood sugars running from 70-110 fasting.  Doing well.  Feels back to normal today.    ROS:   Constitutional:  No f/c, No night sweats, No unexplained weight loss. EENT:  No vision changes, No blurry vision, No hearing changes. No mouth, throat, or ear problems otday.  Respiratory: No cough, No SOB Cardiac: No CP, no palpitations GI:  No abd pain, No N/V/D. GU: No Urinary s/sx Musculoskeletal: No joint pain Neuro: No headache, no dizziness, no motor weakness.  Skin: No rash Endocrine:  No polydipsia. No polyuria.  Psych: Denies SI/HI  No problems updated.  ALLERGIES: No Known Allergies  PAST MEDICAL HISTORY: Past Medical History:  Diagnosis Date  . Arthritis    "back" (02/06/2017)  . CHF (congestive heart failure) ([place])    new onset /Encounter Date 09/12/2006  . Chronic combined systolic and diastolic heart failure ([employer])   . CKD (chronic kidney disease) stage 4, GFR 15-29 ml/min (HCC)   . Glaucoma, right eye   . High cholesterol   . Hypertension   . Hypertrophy of tonsils alone   . Myocardial infarction [facility])    "small one; ~ 2008" (02/06/2017)  . Obesity, unspecified   . On home oxygen therapy    "4L; 24/7" (04/10/2017)  . OSA on CPAP   . Type II diabetes mellitus ([place])     MEDICATIONS AT HOME: Prior to Admission medications    Medication Sig Start Date End Date Taking? Authorizing Provider  ACCU-CHEK SOFTCLIX LANCETS lancets Use as instructed to check blood sugar up to 3 times daily. 06/19/18   Charlott Rakes, MD  albuterol (PROVENTIL HFA;VENTOLIN HFA) 108 (90 Base) MCG/ACT inhaler Inhale 2 puffs into the lungs every 6 (six) hours as needed for wheezing or shortness of breath. 06/30/18   Charlott Rakes, MD  allopurinol (ZYLOPRIM) 100 MG tablet Take 1 tablet (100 mg total) by mouth daily. 06/30/18   Charlott Rakes, MD  amLODipine (NORVASC) 10 MG tablet Take 10 mg daily by mouth. 03/09/17   [provider]  aspirin 325 MG tablet Take 325 mg by mouth daily.    [provider]  atorvastatin (LIPITOR) 40 MG tablet TAKE 1 TABLET(40 MG) BY MOUTH DAILY 06/26/18   Charlott Rakes, MD  Blood Glucose Monitoring Suppl (ACCU-CHEK AVIVA) device Use as instructed to check blood sugar up to 3 times daily. 06/19/18   Charlott Rakes, MD  bumetanide (BUMEX) 2 MG tablet Take 1 tablet (2 mg total) by mouth 2 (two) times daily. 06/30/18   Charlott Rakes, MD  calcium carbonate (TUMS - DOSED IN MG ELEMENTAL CALCIUM) 500 MG chewable tablet Chew 1 tablet (200 mg of elemental calcium total) by mouth 2 (two) times daily. 11/27/14   Dixie Dials, MD  carvedilol (COREG) 6.25  MG tablet Take 1 tablet (6.25 mg total) by mouth 2 (two) times daily. 06/30/18   Charlott Rakes, MD  cetirizine (ZYRTEC) 10 MG tablet Take 1 tablet (10 mg total) by mouth daily. 09/17/18   Argentina Donovan, PA-C  Cholecalciferol (VITAMIN D3) 50 MCG (2000 UT) TABS Take 1 tablet by mouth daily.    [provider]  ferrous sulfate 325 (65 FE) MG tablet Take 325 mg by mouth daily with breakfast.    [provider]  Fluticasone-Salmeterol (ADVAIR DISKUS) 250-50 MCG/DOSE AEPB Inhale 1 puff into the lungs 2 (two) times daily. 06/30/18   Charlott Rakes, MD  glucose blood (ACCU-CHEK AVIVA PLUS) test strip Use as instructed to check blood sugar up to 3 times  daily. 06/19/18   Charlott Rakes, MD  hydrALAZINE (APRESOLINE) 25 MG tablet Take 1 tablet (25 mg total) by mouth 3 (three) times daily. 06/30/18   Charlott Rakes, MD  isosorbide mononitrate (IMDUR) 30 MG 24 hr tablet Take 1 tablet (30 mg total) by mouth daily. 06/30/18   Charlott Rakes, MD  Lancet Devices [facility]) lancets Use as instructed daily. 05/14/17   Charlott Rakes, MD  lisinopril (PRINIVIL,ZESTRIL) 20 MG tablet Take 1 tablet (20 mg total) by mouth daily. 06/30/18   Charlott Rakes, MD  nitroGLYCERIN (NITROSTAT) 0.4 MG SL tablet Place 1 tablet (0.4 mg total) under the tongue every 5 (five) minutes x 3 doses as needed for chest pain. 11/12/17   Charlott Rakes, MD  timolol (BETIMOL) 0.5 % ophthalmic solution Place 1 drop into both eyes daily.     [provider]  TRAVATAN Z 0.004 % SOLN ophthalmic solution Place 1 drop into both eyes at bedtime. 11/01/15   [provider]     Objective:  EXAM:   Vitals:   09/17/18 0859  BP: 106/70  Pulse: 69  Resp: 16  Temp: 97.7 F (36.5 C)  TempSrc: Oral  SpO2: 95%  Weight: (!) 343 lb (155.6 kg)    General appearance : A&OX3. NAD. Non-toxic-appearing HEENT: Atraumatic and Normocephalic.  PERRLA. EOM intact.  TM clear B. Mouth-MMM, post pharynx WNL w/o erythema, No PND.  No swelling of lips at all today.  Throat is patent.  There is NO mucosal swelling of any type.  Face appears normal.  Missing multiple teeth.   Neck: supple, no JVD. No cervical lymphadenopathy. No thyromegaly Chest/Lungs:  Breathing-non-labored, Good air entry bilaterally, breath sounds normal without rales, rhonchi, or wheezing  CVS: S1 S2 regular, no murmurs, gallops, rubs  Neurology:  CN II-XII grossly intact, Non focal.   Psych:  TP linear. J/I WNL. Normal speech. Appropriate eye contact and affect.  Skin:  No Rash  Data Review Lab Results  Component Value Date   HGBA1C 6.1 06/30/2018   HGBA1C 5.7 03/04/2018   HGBA1C 6.0 11/12/2017      Assessment & Plan   1. Allergic reaction, initial encounter -avoid offending plants/trees - cetirizine (ZYRTEC) 10 MG tablet; Take 1 tablet (10 mg total) by mouth daily.  Dispense: 30 tablet; Refill: 11 -discussed dangers of severe allergic reaction/true angioedema and advised to call 911 if any of those occur.  Patient expresses understanding and agrees.    2. Diabetes mellitus type 2 in obese ([place]) Controlled.  Continue current regimen and work on diet.   - Glucose (CBG)   Patient have been counseled extensively about nutrition and exercise  Return for for 5/6 appt with Dr Evie Lacks that is already scheduled.  The patient was  given clear instructions to go to ER or return to medical center if symptoms don't improve, worsen or new problems develop. The patient verbalized understanding. The patient was told to call to get lab results if they haven't heard anything in the next week.     Freeman Caldron, PA-C [facility] [place], [place]   09/17/2018, 9:05 AM

## 2018-09-24 DIAGNOSIS — G4733 Obstructive sleep apnea (adult) (pediatric): Secondary | ICD-10-CM | POA: Diagnosis not present

## 2018-09-27 ENCOUNTER — Other Ambulatory Visit: Payer: Self-pay | Admitting: Family Medicine

## 2018-09-27 DIAGNOSIS — E669 Obesity, unspecified: Principal | ICD-10-CM

## 2018-09-27 DIAGNOSIS — E1169 Type 2 diabetes mellitus with other specified complication: Secondary | ICD-10-CM

## 2018-09-30 ENCOUNTER — Encounter: Payer: Self-pay | Admitting: Family Medicine

## 2018-09-30 ENCOUNTER — Ambulatory Visit: Payer: Medicare HMO | Attending: Family Medicine | Admitting: Family Medicine

## 2018-09-30 ENCOUNTER — Other Ambulatory Visit: Payer: Self-pay

## 2018-09-30 DIAGNOSIS — E1121 Type 2 diabetes mellitus with diabetic nephropathy: Secondary | ICD-10-CM | POA: Diagnosis not present

## 2018-09-30 DIAGNOSIS — N184 Chronic kidney disease, stage 4 (severe): Secondary | ICD-10-CM

## 2018-09-30 DIAGNOSIS — I5042 Chronic combined systolic (congestive) and diastolic (congestive) heart failure: Secondary | ICD-10-CM | POA: Diagnosis not present

## 2018-09-30 DIAGNOSIS — G4733 Obstructive sleep apnea (adult) (pediatric): Secondary | ICD-10-CM

## 2018-09-30 DIAGNOSIS — I10 Essential (primary) hypertension: Secondary | ICD-10-CM

## 2018-09-30 NOTE — Progress Notes (Signed)
Virtual Visit via Telephone Note  I connected with Adam Dennis, on 09/30/2018 at 8:37 AM by telephone due to the COVID-19 pandemic and verified that I am speaking with the correct person using two identifiers.   Consent: I discussed the limitations, risks, security and privacy concerns of performing an evaluation and management service by telephone and the availability of in person appointments. I also discussed with the patient that there may be a patient responsible charge related to this service. The patient expressed understanding and agreed to proceed.   Location of Patient: Home  Location of Provider: Clinic   Persons participating in Telemedicine visit: BENSON PORCARO  She Farrington-CMA Dr. Margarita Rana -PCP     History of Present Illness: Adam Dennis  is a 58 year old male with a history of hypertension, stage III- IV chronic kidney disease, obstructive sleep apnea (on CPAP at night), type 2 diabetes mellitus (A1c 6.1 diet controlled), combined systolic and diastolic CHF (EF 41-28% from TEE of 03/2017) followed by cardiology, obesity hypoventilation syndrome previously on 4 L of oxygen for chronic respiratory failure ( no longer requires oxygen) who presents today for a follow-up visit.  His blood pressure yesterday was 128/68 and his weight this morning was 336 pounds.  He walks on some days of the week and this is his major form of exercise. Denies dyspnea, pedal edema but has a two-pillow orthopnea. Last visit with cardiology was 3 months ago.  Doing well on his CPAP and is compliant with his nephrology follow-up for management of his chronic kidney disease.  He denies gout flares. Denies additional concerns today. With regards to his diabetes mellitus he is on diet control and denies hypoglycemia or numbness in extremities.  He had an eye exam this year but is wondering if he can be referred to podiatrist for toenail clipping.   Past Medical History:  Diagnosis Date   . Arthritis    "back" (02/06/2017)  . CHF (congestive heart failure) ([place])    new onset /Encounter Date 09/12/2006  . Chronic combined systolic and diastolic heart failure ([place])   . CKD (chronic kidney disease) stage 4, GFR 15-29 ml/min (HCC)   . Glaucoma, right eye   . High cholesterol   . Hypertension   . Hypertrophy of tonsils alone   . Myocardial infarction [facility])    "small one; ~ 2008" (02/06/2017)  . Obesity, unspecified   . On home oxygen therapy    "4L; 24/7" (04/10/2017)  . OSA on CPAP   . Type II diabetes mellitus (HCC)    No Known Allergies  Current Outpatient Medications on File Prior to Visit  Medication Sig Dispense Refill  . ACCU-CHEK SOFTCLIX LANCETS lancets Use as instructed to check blood sugar up to 3 times daily. 100 each 11  . albuterol (PROVENTIL HFA;VENTOLIN HFA) 108 (90 Base) MCG/ACT inhaler Inhale 2 puffs into the lungs every 6 (six) hours as needed for wheezing or shortness of breath. 1 Inhaler 6  . allopurinol (ZYLOPRIM) 100 MG tablet Take 1 tablet (100 mg total) by mouth daily. 90 tablet 1  . amLODipine (NORVASC) 10 MG tablet Take 10 mg daily by mouth.  0  . aspirin 325 MG tablet Take 325 mg by mouth daily.    Marland Kitchen atorvastatin (LIPITOR) 40 MG tablet TAKE 1 TABLET(40 MG) BY MOUTH DAILY 90 tablet 0  . Blood Glucose Monitoring Suppl (ACCU-CHEK AVIVA) device Use as instructed to check blood sugar up to 3 times daily. 1 each 0  .  bumetanide (BUMEX) 2 MG tablet Take 1 tablet (2 mg total) by mouth 2 (two) times daily. 60 tablet 2  . calcium carbonate (TUMS - DOSED IN MG ELEMENTAL CALCIUM) 500 MG chewable tablet Chew 1 tablet (200 mg of elemental calcium total) by mouth 2 (two) times daily.    . carvedilol (COREG) 6.25 MG tablet Take 1 tablet (6.25 mg total) by mouth 2 (two) times daily. 180 tablet 1  . cetirizine (ZYRTEC) 10 MG tablet Take 1 tablet (10 mg total) by mouth daily. 30 tablet 11  . Cholecalciferol (VITAMIN D3) 50 MCG (2000 UT) TABS Take 1 tablet by mouth  daily.    . ferrous sulfate 325 (65 FE) MG tablet Take 325 mg by mouth daily with breakfast.    . Fluticasone-Salmeterol (ADVAIR DISKUS) 250-50 MCG/DOSE AEPB Inhale 1 puff into the lungs 2 (two) times daily. 1 each 6  . glucose blood (ACCU-CHEK AVIVA PLUS) test strip Use as instructed to check blood sugar up to 3 times daily. 100 each 11  . hydrALAZINE (APRESOLINE) 25 MG tablet Take 1 tablet (25 mg total) by mouth 3 (three) times daily. 90 tablet 6  . isosorbide mononitrate (IMDUR) 30 MG 24 hr tablet Take 1 tablet (30 mg total) by mouth daily. 90 tablet 1  . Lancet Devices (ACCU-CHEK SOFTCLIX) lancets Use as instructed daily. 1 each 5  . lisinopril (PRINIVIL,ZESTRIL) 20 MG tablet Take 1 tablet (20 mg total) by mouth daily. 90 tablet 1  . nitroGLYCERIN (NITROSTAT) 0.4 MG SL tablet Place 1 tablet (0.4 mg total) under the tongue every 5 (five) minutes x 3 doses as needed for chest pain. 30 tablet 1  . timolol (BETIMOL) 0.5 % ophthalmic solution Place 1 drop into both eyes daily.     . TRAVATAN Z 0.004 % SOLN ophthalmic solution Place 1 drop into both eyes at bedtime.  12   No current facility-administered medications on file prior to visit.     Observations/Objective: Awake, alert, oriented x3 Not in acute distress  CMP Latest Ref Rng & Units 07/01/2018 03/04/2018 08/14/2017  Glucose 65 - 99 mg/dL 84 82 83  BUN 6 - 24 mg/dL 32(H) 38(H) 34(H)  Creatinine 0.76 - 1.27 mg/dL 3.14(H) 3.03(H) 3.21(H)  Sodium 134 - 144 mmol/L 142 145(H) 143  Potassium 3.5 - 5.2 mmol/L 4.6 4.9 4.5  Chloride 96 - 106 mmol/L 101 104 104  CO2 20 - 29 mmol/L 26 26 25   Calcium 8.7 - 10.2 mg/dL 8.4(L) 9.0 8.7  Total Protein 6.0 - 8.5 g/dL 6.9 - 7.2  Total Bilirubin 0.0 - 1.2 mg/dL 0.5 - 0.7  Alkaline Phos 39 - 117 IU/L 49 - 60  AST 0 - 40 IU/L 23 - 16  ALT 0 - 44 IU/L 18 - 9    Lipid Panel     Component Value Date/Time   CHOL 112 07/01/2018 1114   TRIG 56 07/01/2018 1114   HDL 52 07/01/2018 1114   CHOLHDL 2.2  07/01/2018 1114   CHOLHDL 2.8 10/07/2014 1547   VLDL 13 10/07/2014 1547   LDLCALC 49 07/01/2018 1114    Lab Results  Component Value Date   HGBA1C 6.1 06/30/2018     Assessment and Plan: 1. Chronic combined systolic and diastolic CHF (congestive heart failure) (HCC) EF 60 to 65% on 03/2017 Euvolemic Weight is stable  2. CKD (chronic kidney disease) stage 4, GFR 15-29 ml/min (HCC) Combination of hypertensive and diabetic nephropathy Avoid nephrotoxin Followed by [facility]  3.  Diabetic nephropathy associated with type 2 diabetes mellitus ([place]) Diet controlled with A1c of 6.1 Up-to-date on annual eye exam Continue diabetic diet and lifestyle modifications - Ambulatory referral to Podiatry  4. Essential hypertension, benign Controlled Continue antihypertensives  5. OSA (obstructive sleep apnea) Doing well on CPAP   Follow Up Instructions: Return in about 3 months (around 12/31/2018).    I discussed the assessment and treatment plan with the patient. The patient was provided an opportunity to ask questions and all were answered. The patient agreed with the plan and demonstrated an understanding of the instructions.   The patient was advised to call back or seek an in-person evaluation if the symptoms worsen or if the condition fails to improve as anticipated.     I provided 25 minutes total of non-face-to-face time during this encounter including median intraservice time, reviewing previous notes, labs, imaging, medications and explaining diagnosis and management.     Charlott Rakes, MD, FAAFP. [facility] and [place], [place]   09/30/2018, 8:37 AM

## 2018-09-30 NOTE — Progress Notes (Signed)
Patient has been called and DOB has been verified. Patient has been screened and transferred to PCP to start phone visit.     

## 2018-10-22 ENCOUNTER — Other Ambulatory Visit: Payer: Self-pay | Admitting: Family Medicine

## 2018-10-22 DIAGNOSIS — I5042 Chronic combined systolic (congestive) and diastolic (congestive) heart failure: Secondary | ICD-10-CM

## 2018-10-28 DIAGNOSIS — N189 Chronic kidney disease, unspecified: Secondary | ICD-10-CM | POA: Diagnosis not present

## 2018-10-28 DIAGNOSIS — N2581 Secondary hyperparathyroidism of renal origin: Secondary | ICD-10-CM | POA: Diagnosis not present

## 2018-10-28 DIAGNOSIS — I5032 Chronic diastolic (congestive) heart failure: Secondary | ICD-10-CM | POA: Diagnosis not present

## 2018-10-28 DIAGNOSIS — G4733 Obstructive sleep apnea (adult) (pediatric): Secondary | ICD-10-CM | POA: Diagnosis not present

## 2018-10-28 DIAGNOSIS — I129 Hypertensive chronic kidney disease with stage 1 through stage 4 chronic kidney disease, or unspecified chronic kidney disease: Secondary | ICD-10-CM | POA: Diagnosis not present

## 2018-10-28 DIAGNOSIS — N184 Chronic kidney disease, stage 4 (severe): Secondary | ICD-10-CM | POA: Diagnosis not present

## 2018-10-28 DIAGNOSIS — E1122 Type 2 diabetes mellitus with diabetic chronic kidney disease: Secondary | ICD-10-CM | POA: Diagnosis not present

## 2018-10-28 DIAGNOSIS — D631 Anemia in chronic kidney disease: Secondary | ICD-10-CM | POA: Diagnosis not present

## 2018-11-25 ENCOUNTER — Ambulatory Visit (INDEPENDENT_AMBULATORY_CARE_PROVIDER_SITE_OTHER): Payer: Medicare HMO | Admitting: Podiatry

## 2018-11-25 ENCOUNTER — Encounter: Payer: Self-pay | Admitting: Podiatry

## 2018-11-25 ENCOUNTER — Other Ambulatory Visit: Payer: Self-pay

## 2018-11-25 DIAGNOSIS — B351 Tinea unguium: Secondary | ICD-10-CM

## 2018-11-25 DIAGNOSIS — E1169 Type 2 diabetes mellitus with other specified complication: Secondary | ICD-10-CM | POA: Diagnosis not present

## 2018-11-25 DIAGNOSIS — M79675 Pain in left toe(s): Secondary | ICD-10-CM

## 2018-11-25 DIAGNOSIS — M79674 Pain in right toe(s): Secondary | ICD-10-CM | POA: Diagnosis not present

## 2018-11-25 DIAGNOSIS — E669 Obesity, unspecified: Secondary | ICD-10-CM

## 2018-11-25 NOTE — Progress Notes (Signed)
Complaint:  Visit Type: Patient returns to my office for continued preventative foot care services. Complaint: Patient states" my nails have grown long and thick and become painful to walk and wear shoes" Patient has been diagnosed with DM with no foot complications. The patient presents for preventative foot care services. No changes to ROS.  Patient has not been seen for over one year.  Podiatric Exam: Vascular: dorsalis pedis and posterior tibial pulses are palpable bilateral. Capillary return is immediate. Temperature gradient is WNL. Skin turgor WNL  Sensorium: Normal Semmes Weinstein monofilament test. Normal tactile sensation bilaterally. Nail Exam: Pt has thick disfigured discolored nails with subungual debris noted bilateral entire nail hallux through fifth toenails Ulcer Exam: There is no evidence of ulcer or pre-ulcerative changes or infection. Orthopedic Exam: Muscle tone and strength are WNL. No limitations in general ROM. No crepitus or effusions noted. Foot type and digits show no abnormalities. Bony prominences are unremarkable. Skin: No Porokeratosis. No infection or ulcers  Diagnosis:  Onychomycosis, , Pain in right toe, pain in left toes  Treatment & Plan Procedures and Treatment: Consent by patient was obtained for treatment procedures.   Debridement of mycotic and hypertrophic toenails, 1 through 5 bilateral and clearing of subungual debris. No ulceration, no infection noted.  Return Visit-Office Procedure: Patient instructed to return to the office for a follow up visit 4 months for continued evaluation and treatment.    Gardiner Barefoot DPM

## 2018-12-01 DIAGNOSIS — H401123 Primary open-angle glaucoma, left eye, severe stage: Secondary | ICD-10-CM | POA: Diagnosis not present

## 2018-12-01 DIAGNOSIS — H40011 Open angle with borderline findings, low risk, right eye: Secondary | ICD-10-CM | POA: Diagnosis not present

## 2018-12-07 DIAGNOSIS — E785 Hyperlipidemia, unspecified: Secondary | ICD-10-CM | POA: Diagnosis not present

## 2018-12-07 DIAGNOSIS — I5042 Chronic combined systolic (congestive) and diastolic (congestive) heart failure: Secondary | ICD-10-CM | POA: Diagnosis not present

## 2018-12-07 DIAGNOSIS — G4733 Obstructive sleep apnea (adult) (pediatric): Secondary | ICD-10-CM | POA: Diagnosis not present

## 2018-12-07 DIAGNOSIS — M109 Gout, unspecified: Secondary | ICD-10-CM | POA: Diagnosis not present

## 2018-12-07 DIAGNOSIS — I10 Essential (primary) hypertension: Secondary | ICD-10-CM | POA: Diagnosis not present

## 2018-12-07 DIAGNOSIS — N189 Chronic kidney disease, unspecified: Secondary | ICD-10-CM | POA: Diagnosis not present

## 2018-12-24 DIAGNOSIS — G4733 Obstructive sleep apnea (adult) (pediatric): Secondary | ICD-10-CM | POA: Diagnosis not present

## 2018-12-26 ENCOUNTER — Other Ambulatory Visit: Payer: Self-pay | Admitting: Family Medicine

## 2018-12-26 DIAGNOSIS — E1169 Type 2 diabetes mellitus with other specified complication: Secondary | ICD-10-CM

## 2018-12-26 DIAGNOSIS — E669 Obesity, unspecified: Secondary | ICD-10-CM

## 2019-01-25 ENCOUNTER — Other Ambulatory Visit: Payer: Self-pay | Admitting: Family Medicine

## 2019-01-25 DIAGNOSIS — M1A00X0 Idiopathic chronic gout, unspecified site, without tophus (tophi): Secondary | ICD-10-CM

## 2019-01-26 ENCOUNTER — Other Ambulatory Visit: Payer: Self-pay | Admitting: Family Medicine

## 2019-01-26 DIAGNOSIS — I5042 Chronic combined systolic (congestive) and diastolic (congestive) heart failure: Secondary | ICD-10-CM

## 2019-01-27 ENCOUNTER — Other Ambulatory Visit: Payer: Self-pay | Admitting: Family Medicine

## 2019-01-27 DIAGNOSIS — I5042 Chronic combined systolic (congestive) and diastolic (congestive) heart failure: Secondary | ICD-10-CM

## 2019-01-29 ENCOUNTER — Other Ambulatory Visit: Payer: Self-pay | Admitting: Family Medicine

## 2019-02-23 ENCOUNTER — Other Ambulatory Visit: Payer: Self-pay | Admitting: Family Medicine

## 2019-02-23 DIAGNOSIS — I5042 Chronic combined systolic (congestive) and diastolic (congestive) heart failure: Secondary | ICD-10-CM

## 2019-03-08 DIAGNOSIS — N189 Chronic kidney disease, unspecified: Secondary | ICD-10-CM | POA: Diagnosis not present

## 2019-03-08 DIAGNOSIS — M109 Gout, unspecified: Secondary | ICD-10-CM | POA: Diagnosis not present

## 2019-03-08 DIAGNOSIS — I5042 Chronic combined systolic (congestive) and diastolic (congestive) heart failure: Secondary | ICD-10-CM | POA: Diagnosis not present

## 2019-03-08 DIAGNOSIS — R0609 Other forms of dyspnea: Secondary | ICD-10-CM | POA: Diagnosis not present

## 2019-03-08 DIAGNOSIS — G4733 Obstructive sleep apnea (adult) (pediatric): Secondary | ICD-10-CM | POA: Diagnosis not present

## 2019-03-08 DIAGNOSIS — I10 Essential (primary) hypertension: Secondary | ICD-10-CM | POA: Diagnosis not present

## 2019-03-08 DIAGNOSIS — E785 Hyperlipidemia, unspecified: Secondary | ICD-10-CM | POA: Diagnosis not present

## 2019-03-16 ENCOUNTER — Other Ambulatory Visit: Payer: Self-pay | Admitting: Family Medicine

## 2019-03-16 DIAGNOSIS — I5042 Chronic combined systolic (congestive) and diastolic (congestive) heart failure: Secondary | ICD-10-CM

## 2019-03-18 ENCOUNTER — Telehealth: Payer: Self-pay | Admitting: Family Medicine

## 2019-03-18 DIAGNOSIS — I5042 Chronic combined systolic (congestive) and diastolic (congestive) heart failure: Secondary | ICD-10-CM

## 2019-03-18 NOTE — Telephone Encounter (Signed)
1) Medication(s) Requested (by name): -bumetanide (BUMEX) 2 MG tablet   2) Pharmacy of Choice: -[facility] [place] [address] [place]  3) Special Requests: Would like at least one week supply, until his appt 10.28.2020

## 2019-03-19 DIAGNOSIS — R0609 Other forms of dyspnea: Secondary | ICD-10-CM | POA: Diagnosis not present

## 2019-03-19 MED ORDER — BUMETANIDE 2 MG PO TABS: 2.0000 mg | ORAL_TABLET | Freq: Two times a day (BID) | ORAL | 0 refills | Status: DC

## 2019-03-19 NOTE — Telephone Encounter (Signed)
Refill provided

## 2019-03-24 ENCOUNTER — Ambulatory Visit: Payer: Medicare HMO | Attending: Family Medicine | Admitting: Physician Assistant

## 2019-03-24 ENCOUNTER — Other Ambulatory Visit: Payer: Self-pay | Admitting: Physician Assistant

## 2019-03-24 ENCOUNTER — Other Ambulatory Visit: Payer: Self-pay

## 2019-03-24 VITALS — BP 129/74 | HR 74 | Temp 98.7°F | Ht 72.0 in | Wt 348.4 lb

## 2019-03-24 DIAGNOSIS — E7849 Other hyperlipidemia: Secondary | ICD-10-CM | POA: Diagnosis not present

## 2019-03-24 DIAGNOSIS — J438 Other emphysema: Secondary | ICD-10-CM

## 2019-03-24 DIAGNOSIS — M1A00X0 Idiopathic chronic gout, unspecified site, without tophus (tophi): Secondary | ICD-10-CM

## 2019-03-24 DIAGNOSIS — E1165 Type 2 diabetes mellitus with hyperglycemia: Secondary | ICD-10-CM | POA: Diagnosis not present

## 2019-03-24 DIAGNOSIS — E669 Obesity, unspecified: Secondary | ICD-10-CM | POA: Diagnosis not present

## 2019-03-24 DIAGNOSIS — I10 Essential (primary) hypertension: Secondary | ICD-10-CM

## 2019-03-24 DIAGNOSIS — I5042 Chronic combined systolic (congestive) and diastolic (congestive) heart failure: Secondary | ICD-10-CM

## 2019-03-24 DIAGNOSIS — E1169 Type 2 diabetes mellitus with other specified complication: Secondary | ICD-10-CM

## 2019-03-24 LAB — POCT GLYCOSYLATED HEMOGLOBIN (HGB A1C): Hemoglobin A1C: 6.2 % — AB (ref 4.0–5.6)

## 2019-03-24 LAB — GLUCOSE, POCT (MANUAL RESULT ENTRY): POC Glucose: 145 mg/dl — AB (ref 70–99)

## 2019-03-24 MED ORDER — CARVEDILOL 6.25 MG PO TABS: 6.2500 mg | ORAL_TABLET | Freq: Two times a day (BID) | ORAL | 1 refills | Status: DC

## 2019-03-24 MED ORDER — ALLOPURINOL 100 MG PO TABS: 100.0000 mg | ORAL_TABLET | Freq: Every day | ORAL | 0 refills | Status: DC

## 2019-03-24 MED ORDER — ALBUTEROL SULFATE HFA 108 (90 BASE) MCG/ACT IN AERS: 2.0000 | INHALATION_SPRAY | Freq: Four times a day (QID) | RESPIRATORY_TRACT | 1 refills | Status: DC | PRN

## 2019-03-24 MED ORDER — ISOSORBIDE MONONITRATE ER 30 MG PO TB24: 30.0000 mg | ORAL_TABLET | Freq: Every day | ORAL | 1 refills | Status: DC

## 2019-03-24 MED ORDER — FLUTICASONE-SALMETEROL 250-50 MCG/DOSE IN AEPB: 1.0000 | INHALATION_SPRAY | Freq: Two times a day (BID) | RESPIRATORY_TRACT | 6 refills | Status: DC

## 2019-03-24 MED ORDER — FERROUS SULFATE 325 (65 FE) MG PO TABS: 325.0000 mg | ORAL_TABLET | Freq: Every day | ORAL | 3 refills | Status: DC

## 2019-03-24 MED ORDER — BUMETANIDE 2 MG PO TABS: 2.0000 mg | ORAL_TABLET | Freq: Two times a day (BID) | ORAL | 3 refills | Status: DC

## 2019-03-24 MED ORDER — LISINOPRIL 20 MG PO TABS: 20.0000 mg | ORAL_TABLET | Freq: Every day | ORAL | 1 refills | Status: DC

## 2019-03-24 MED ORDER — AMLODIPINE BESYLATE 10 MG PO TABS: 10.0000 mg | ORAL_TABLET | Freq: Every day | ORAL | 1 refills | Status: DC

## 2019-03-24 MED ORDER — ATORVASTATIN CALCIUM 40 MG PO TABS: ORAL_TABLET | ORAL | 1 refills | Status: DC

## 2019-03-24 NOTE — Progress Notes (Signed)
Adam Dennis, is a 58 y.o. male  XNA:[id number]  RKY:[id number]  DOB - 01-26-1961  Subjective:  Chief Complaint and HPI: Adam Dennis is a 58 y.o. male here today for 3 months check up.  Doing well no complaints.  Compliant with meds.  He says he did see nephrology.  He had his flu shot 3 weeks ago at [facility].    ROS:   Constitutional:  No f/c, No night sweats, No unexplained weight loss. EENT:  No vision changes, No blurry vision, No hearing changes. No mouth, throat, or ear problems.  Respiratory: No cough, No SOB Cardiac: No CP, no palpitations GI:  No abd pain, No N/V/D. GU: No Urinary s/sx Musculoskeletal: No joint pain Neuro: No headache, no dizziness, no motor weakness.  Skin: No rash Endocrine:  No polydipsia. No polyuria.  Psych: Denies SI/HI  No problems updated.  ALLERGIES: No Known Allergies  PAST MEDICAL HISTORY: Past Medical History:  Diagnosis Date  . Arthritis    "back" (02/06/2017)  . CHF (congestive heart failure) ([place])    new onset /Encounter Date 09/12/2006  . Chronic combined systolic and diastolic heart failure ([place])   . CKD (chronic kidney disease) stage 4, GFR 15-29 ml/min (HCC)   . Glaucoma, right eye   . High cholesterol   . Hypertension   . Hypertrophy of tonsils alone   . Myocardial infarction [facility])    "small one; ~ 2008" (02/06/2017)  . Obesity, unspecified   . On home oxygen therapy    "4L; 24/7" (04/10/2017)  . OSA on CPAP   . Type II diabetes mellitus ([place])     MEDICATIONS AT HOME: Prior to Admission medications   Medication Sig Start Date End Date Taking? Authorizing Provider  ACCU-CHEK SOFTCLIX LANCETS lancets Use as instructed to check blood sugar up to 3 times daily. 06/19/18  Yes Charlott Rakes, MD  albuterol (VENTOLIN HFA) 108 (90 Base) MCG/ACT inhaler Inhale 2 puffs into the lungs every 6 (six) hours as needed for wheezing or shortness of breath. 03/24/19  Yes Freeman Caldron M, PA-C  allopurinol (ZYLOPRIM) 100 MG tablet  Take 1 tablet (100 mg total) by mouth daily. Must have office visit for refills 03/24/19  Yes Argentina Donovan, PA-C  amLODipine (NORVASC) 10 MG tablet Take 1 tablet (10 mg total) by mouth daily. 03/24/19  Yes Argentina Donovan, PA-C  aspirin 325 MG tablet Take 325 mg by mouth daily.   Yes [provider]  atorvastatin (LIPITOR) 40 MG tablet TAKE 1 TABLET(40 MG) BY MOUTH DAILY 03/24/19  Yes McClung, Angela M, PA-C  Blood Glucose Monitoring Suppl (ACCU-CHEK AVIVA) device Use as instructed to check blood sugar up to 3 times daily. 06/19/18  Yes Newlin, Charlane Ferretti, MD  bumetanide (BUMEX) 2 MG tablet Take 1 tablet (2 mg total) by mouth 2 (two) times daily. 03/24/19  Yes Freeman Caldron M, PA-C  calcitRIOL (ROCALTROL) 0.25 MCG capsule TK 1 C PO D 10/30/18  Yes [provider]  calcium carbonate (TUMS - DOSED IN MG ELEMENTAL CALCIUM) 500 MG chewable tablet Chew 1 tablet (200 mg of elemental calcium total) by mouth 2 (two) times daily. 11/27/14  Yes Dixie Dials, MD  carvedilol (COREG) 6.25 MG tablet Take 1 tablet (6.25 mg total) by mouth 2 (two) times daily. 03/24/19  Yes Freeman Caldron M, PA-C  cetirizine (ZYRTEC) 10 MG tablet Take 1 tablet (10 mg total) by mouth daily. 09/17/18  Yes Argentina Donovan, PA-C  Cholecalciferol (VITAMIN D3) 50 MCG (  2000 UT) TABS Take 1 tablet by mouth daily.   Yes [provider]  ferrous sulfate 325 (65 FE) MG tablet Take 1 tablet (325 mg total) by mouth daily with breakfast. 03/24/19  Yes McClung, Angela M, PA-C  Fluticasone-Salmeterol (ADVAIR DISKUS) 250-50 MCG/DOSE AEPB Inhale 1 puff into the lungs 2 (two) times daily. 03/24/19  Yes Freeman Caldron M, PA-C  glucose blood (ACCU-CHEK AVIVA PLUS) test strip Use as instructed to check blood sugar up to 3 times daily. 06/19/18  Yes Charlott Rakes, MD  isosorbide mononitrate (IMDUR) 30 MG 24 hr tablet Take 1 tablet (30 mg total) by mouth daily. 03/24/19  Yes Argentina Donovan, PA-C  Lancet Devices (ACCU-CHEK  [place]) lancets Use as instructed daily. 05/14/17  Yes Charlott Rakes, MD  lisinopril (ZESTRIL) 20 MG tablet Take 1 tablet (20 mg total) by mouth daily. 03/24/19  Yes McClung, Angela M, PA-C  nitroGLYCERIN (NITROSTAT) 0.4 MG SL tablet PLACE 1 TABLET BY MOUTH UNDER TONGUE EVERY 5 MINUTES FOR 3 DOSES AS NEEDED FOR CHEST PAIN 01/29/19  Yes Newlin, Enobong, MD  timolol (BETIMOL) 0.5 % ophthalmic solution Place 1 drop into both eyes daily.    Yes [provider]  TRAVATAN Z 0.004 % SOLN ophthalmic solution Place 1 drop into both eyes at bedtime. 11/01/15  Yes [provider]  hydrALAZINE (APRESOLINE) 25 MG tablet Take 25 mg by mouth 3 (three) times daily. 02/10/19   [provider]  timolol (TIMOPTIC-XR) 0.5 % ophthalmic gel-forming INT 1 GTT IN OU IN THE MORNING 10/31/18   [provider]     Objective:  EXAM:   Vitals:   03/24/19 0951  BP: 129/74  Pulse: 74  Temp: 98.7 F (37.1 C)  TempSrc: Oral  SpO2: 93%  Weight: (!) 348 lb 6.4 oz (158 kg)  Height: 6' (1.829 m)    General appearance : A&OX3. NAD. Non-toxic-appearing, morbidly obese HEENT: Atraumatic and Normocephalic.  PERRLA. EOM intact. Neck: supple, no JVD. No cervical lymphadenopathy. No thyromegaly Chest/Lungs:  Breathing-non-labored, Good air entry bilaterally, breath sounds normal without rales, rhonchi, or wheezing  CVS: S1 S2 regular, no murmurs, gallops, rubs  Extremities: Bilateral Lower Ext shows no edema, both legs are warm to touch with = pulse throughout Neurology:  CN II-XII grossly intact, Non focal.   Psych:  TP linear. J/I WNL. Normal speech. Appropriate eye contact and affect.  Skin:  No Rash  Data Review Lab Results  Component Value Date   HGBA1C 6.2 (A) 03/24/2019   HGBA1C 6.1 06/30/2018   HGBA1C 5.7 03/04/2018     Assessment & Plan   1. Diabetes mellitus type 2 in obese ([place]) Needs to improve upon diet.  I have had a lengthy discussion and provided education about  insulin resistance and the intake of too much sugar/refined carbohydrates.  I have advised the patient to work at a goal of eliminating sugary drinks, candy, desserts, sweets, refined sugars, processed foods, and white carbohydrates.  The patient expresses understanding.  - Glucose (CBG) - HgB A1c - Comprehensive metabolic panel - atorvastatin (LIPITOR) 40 MG tablet; TAKE 1 TABLET(40 MG) BY MOUTH DAILY  Dispense: 90 tablet; Refill: 1  2. Other emphysema (HCC) - albuterol (VENTOLIN HFA) 108 (90 Base) MCG/ACT inhaler; Inhale 2 puffs into the lungs every 6 (six) hours as needed for wheezing or shortness of breath.  Dispense: 18 g; Refill: 1 - Fluticasone-Salmeterol (ADVAIR DISKUS) 250-50 MCG/DOSE AEPB; Inhale 1 puff into the lungs 2 (two) times daily.  Dispense: 1 each; Refill: 6  3. Idiopathic chronic gout without tophus, unspecified site - allopurinol (ZYLOPRIM) 100 MG tablet; Take 1 tablet (100 mg total) by mouth daily. Must have office visit for refills  Dispense: 90 tablet; Refill: 0  4. Chronic combined systolic and diastolic CHF (congestive heart failure) (HCC) Continue f/up with cardiology - bumetanide (BUMEX) 2 MG tablet; Take 1 tablet (2 mg total) by mouth 2 (two) times daily.  Dispense: 60 tablet; Refill: 3 - carvedilol (COREG) 6.25 MG tablet; Take 1 tablet (6.25 mg total) by mouth 2 (two) times daily.  Dispense: 180 tablet; Refill: 1 - isosorbide mononitrate (IMDUR) 30 MG 24 hr tablet; Take 1 tablet (30 mg total) by mouth daily.  Dispense: 90 tablet; Refill: 1  5. Essential hypertension, benign BP controlled - CBC with Differential/Platelet - isosorbide mononitrate (IMDUR) 30 MG 24 hr tablet; Take 1 tablet (30 mg total) by mouth daily.  Dispense: 90 tablet; Refill: 1 - lisinopril (ZESTRIL) 20 MG tablet; Take 1 tablet (20 mg total) by mouth daily.  Dispense: 90 tablet; Refill: 1  6. Other hyperlipidemia - Lipid panel  Patient have been counseled extensively about nutrition and  exercise  Return in about 3 months (around 06/24/2019) for PCP-chronic conditions.  The patient was given clear instructions to go to ER or return to medical center if symptoms don't improve, worsen or new problems develop. The patient verbalized understanding. The patient was told to call to get lab results if they haven't heard anything in the next week.     Freeman Caldron, PA-C [facility] and [place], [place]   03/24/2019, 10:12 AMPatient ID: Adam Dennis, male   DOB: 08-22-1960, 58 y.o.   MRN: [id number]

## 2019-03-24 NOTE — Patient Instructions (Signed)
Decrease sugar and white carbohydrate intake.

## 2019-03-24 NOTE — Progress Notes (Signed)
Med refills  A1c is 6.2   CBG  Is 145

## 2019-03-25 LAB — CBC WITH DIFFERENTIAL/PLATELET
Basophils Absolute: 0 10*3/uL (ref 0.0–0.2)
Basos: 1 %
EOS (ABSOLUTE): 0 10*3/uL (ref 0.0–0.4)
Eos: 1 %
Hematocrit: 39.7 % (ref 37.5–51.0)
Hemoglobin: 12.8 g/dL — ABNORMAL LOW (ref 13.0–17.7)
Immature Grans (Abs): 0 10*3/uL (ref 0.0–0.1)
Immature Granulocytes: 0 %
Lymphocytes Absolute: 1.3 10*3/uL (ref 0.7–3.1)
Lymphs: 22 %
MCH: 28.2 pg (ref 26.6–33.0)
MCHC: 32.2 g/dL (ref 31.5–35.7)
MCV: 87 fL (ref 79–97)
Monocytes Absolute: 0.6 10*3/uL (ref 0.1–0.9)
Monocytes: 11 %
Neutrophils Absolute: 3.7 10*3/uL (ref 1.4–7.0)
Neutrophils: 65 %
Platelets: 191 10*3/uL (ref 150–450)
RBC: 4.54 x10E6/uL (ref 4.14–5.80)
RDW: 13.9 % (ref 11.6–15.4)
WBC: 5.7 10*3/uL (ref 3.4–10.8)

## 2019-03-25 LAB — COMPREHENSIVE METABOLIC PANEL
ALT: 10 IU/L (ref 0–44)
AST: 12 IU/L (ref 0–40)
Albumin/Globulin Ratio: 1 — ABNORMAL LOW (ref 1.2–2.2)
Albumin: 3.4 g/dL — ABNORMAL LOW (ref 3.8–4.9)
Alkaline Phosphatase: 48 IU/L (ref 39–117)
BUN/Creatinine Ratio: 8 — ABNORMAL LOW (ref 9–20)
BUN: 36 mg/dL — ABNORMAL HIGH (ref 6–24)
Bilirubin Total: 0.5 mg/dL (ref 0.0–1.2)
CO2: 28 mmol/L (ref 20–29)
Calcium: 8.8 mg/dL (ref 8.7–10.2)
Chloride: 103 mmol/L (ref 96–106)
Creatinine, Ser: 4.4 mg/dL — ABNORMAL HIGH (ref 0.76–1.27)
GFR calc Af Amer: 16 mL/min/{1.73_m2} — ABNORMAL LOW (ref >59)
GFR calc non Af Amer: 14 mL/min/{1.73_m2} — ABNORMAL LOW (ref >59)
Globulin, Total: 3.5 g/dL (ref 1.5–4.5)
Glucose: 116 mg/dL — ABNORMAL HIGH (ref 65–99)
Potassium: 4.7 mmol/L (ref 3.5–5.2)
Sodium: 141 mmol/L (ref 134–144)
Total Protein: 6.9 g/dL (ref 6.0–8.5)

## 2019-03-25 LAB — LIPID PANEL
Chol/HDL Ratio: 2.3 ratio (ref 0.0–5.0)
Cholesterol, Total: 119 mg/dL (ref 100–199)
HDL: 51 mg/dL (ref >39)
LDL Chol Calc (NIH): 55 mg/dL (ref 0–99)
Triglycerides: 59 mg/dL (ref 0–149)
VLDL Cholesterol Cal: 13 mg/dL (ref 5–40)

## 2019-03-26 DIAGNOSIS — G4733 Obstructive sleep apnea (adult) (pediatric): Secondary | ICD-10-CM | POA: Diagnosis not present

## 2019-03-29 DIAGNOSIS — I129 Hypertensive chronic kidney disease with stage 1 through stage 4 chronic kidney disease, or unspecified chronic kidney disease: Secondary | ICD-10-CM | POA: Diagnosis not present

## 2019-03-29 DIAGNOSIS — N2581 Secondary hyperparathyroidism of renal origin: Secondary | ICD-10-CM | POA: Diagnosis not present

## 2019-03-29 DIAGNOSIS — N184 Chronic kidney disease, stage 4 (severe): Secondary | ICD-10-CM | POA: Diagnosis not present

## 2019-03-29 DIAGNOSIS — E1122 Type 2 diabetes mellitus with diabetic chronic kidney disease: Secondary | ICD-10-CM | POA: Diagnosis not present

## 2019-03-29 DIAGNOSIS — D631 Anemia in chronic kidney disease: Secondary | ICD-10-CM | POA: Diagnosis not present

## 2019-03-29 DIAGNOSIS — I5032 Chronic diastolic (congestive) heart failure: Secondary | ICD-10-CM | POA: Diagnosis not present

## 2019-03-31 ENCOUNTER — Ambulatory Visit (INDEPENDENT_AMBULATORY_CARE_PROVIDER_SITE_OTHER): Payer: Medicare HMO | Admitting: Podiatry

## 2019-03-31 ENCOUNTER — Encounter: Payer: Self-pay | Admitting: Podiatry

## 2019-03-31 ENCOUNTER — Other Ambulatory Visit: Payer: Self-pay

## 2019-03-31 DIAGNOSIS — M79675 Pain in left toe(s): Secondary | ICD-10-CM

## 2019-03-31 DIAGNOSIS — M79674 Pain in right toe(s): Secondary | ICD-10-CM

## 2019-03-31 DIAGNOSIS — E1169 Type 2 diabetes mellitus with other specified complication: Secondary | ICD-10-CM | POA: Diagnosis not present

## 2019-03-31 DIAGNOSIS — B351 Tinea unguium: Secondary | ICD-10-CM | POA: Diagnosis not present

## 2019-03-31 DIAGNOSIS — E669 Obesity, unspecified: Secondary | ICD-10-CM

## 2019-03-31 NOTE — Progress Notes (Signed)
Complaint:  Visit Type: Patient returns to my office for continued preventative foot care services. Complaint: Patient states" my nails have grown long and thick and become painful to walk and wear shoes" Patient has been diagnosed with DM with no foot complications. The patient presents for preventative foot care services. No changes to ROS.  Patient has not been seen for over one year.  Podiatric Exam: Vascular: dorsalis pedis and posterior tibial pulses are palpable bilateral. Capillary return is immediate. Temperature gradient is WNL. Skin turgor WNL  Sensorium: Normal Semmes Weinstein monofilament test. Normal tactile sensation bilaterally. Nail Exam: Pt has thick disfigured discolored nails with subungual debris noted bilateral entire nail hallux through fifth toenails Ulcer Exam: There is no evidence of ulcer or pre-ulcerative changes or infection. Orthopedic Exam: Muscle tone and strength are WNL. No limitations in general ROM. No crepitus or effusions noted. Foot type and digits show no abnormalities. Bony prominences are unremarkable. Skin: No Porokeratosis. No infection or ulcers  Diagnosis:  Onychomycosis, , Pain in right toe, pain in left toes  Treatment & Plan Procedures and Treatment: Consent by patient was obtained for treatment procedures.   Debridement of mycotic and hypertrophic toenails, 1 through 5 bilateral and clearing of subungual debris. No ulceration, no infection noted.  Return Visit-Office Procedure: Patient instructed to return to the office for a follow up visit 4 months for continued evaluation and treatment.    Jex Strausbaugh DPM 

## 2019-04-05 ENCOUNTER — Other Ambulatory Visit: Payer: Self-pay | Admitting: Nephrology

## 2019-04-05 DIAGNOSIS — N184 Chronic kidney disease, stage 4 (severe): Secondary | ICD-10-CM

## 2019-04-06 ENCOUNTER — Ambulatory Visit
Admission: RE | Admit: 2019-04-06 | Discharge: 2019-04-06 | Disposition: A | Discharge status: Home / Self Care | Payer: Medicare HMO | Source: Ambulatory Visit | Attending: Nephrology | Admitting: Nephrology

## 2019-04-06 DIAGNOSIS — N184 Chronic kidney disease, stage 4 (severe): Secondary | ICD-10-CM | POA: Diagnosis not present

## 2019-04-15 ENCOUNTER — Other Ambulatory Visit: Payer: Self-pay | Admitting: Family Medicine

## 2019-04-15 DIAGNOSIS — I10 Essential (primary) hypertension: Secondary | ICD-10-CM

## 2019-04-21 DIAGNOSIS — G4733 Obstructive sleep apnea (adult) (pediatric): Secondary | ICD-10-CM | POA: Diagnosis not present

## 2019-04-21 DIAGNOSIS — N185 Chronic kidney disease, stage 5: Secondary | ICD-10-CM | POA: Diagnosis not present

## 2019-04-21 DIAGNOSIS — N184 Chronic kidney disease, stage 4 (severe): Secondary | ICD-10-CM | POA: Diagnosis not present

## 2019-04-21 DIAGNOSIS — E1122 Type 2 diabetes mellitus with diabetic chronic kidney disease: Secondary | ICD-10-CM | POA: Diagnosis not present

## 2019-04-21 DIAGNOSIS — I120 Hypertensive chronic kidney disease with stage 5 chronic kidney disease or end stage renal disease: Secondary | ICD-10-CM | POA: Diagnosis not present

## 2019-06-11 DIAGNOSIS — G4733 Obstructive sleep apnea (adult) (pediatric): Secondary | ICD-10-CM | POA: Diagnosis not present

## 2019-06-11 DIAGNOSIS — I5042 Chronic combined systolic (congestive) and diastolic (congestive) heart failure: Secondary | ICD-10-CM | POA: Diagnosis not present

## 2019-06-11 DIAGNOSIS — I10 Essential (primary) hypertension: Secondary | ICD-10-CM | POA: Diagnosis not present

## 2019-06-11 DIAGNOSIS — N189 Chronic kidney disease, unspecified: Secondary | ICD-10-CM | POA: Diagnosis not present

## 2019-06-11 DIAGNOSIS — E785 Hyperlipidemia, unspecified: Secondary | ICD-10-CM | POA: Diagnosis not present

## 2019-06-11 DIAGNOSIS — M109 Gout, unspecified: Secondary | ICD-10-CM | POA: Diagnosis not present

## 2019-06-14 ENCOUNTER — Other Ambulatory Visit: Payer: Self-pay

## 2019-06-14 DIAGNOSIS — N184 Chronic kidney disease, stage 4 (severe): Secondary | ICD-10-CM

## 2019-06-15 ENCOUNTER — Ambulatory Visit (INDEPENDENT_AMBULATORY_CARE_PROVIDER_SITE_OTHER): Payer: Medicare HMO | Admitting: Vascular Surgery

## 2019-06-15 ENCOUNTER — Ambulatory Visit (INDEPENDENT_AMBULATORY_CARE_PROVIDER_SITE_OTHER)
Admission: RE | Admit: 2019-06-15 | Discharge: 2019-06-15 | Disposition: A | Discharge status: Home / Self Care | Payer: Medicare HMO | Source: Ambulatory Visit | Attending: Vascular Surgery | Admitting: Vascular Surgery

## 2019-06-15 ENCOUNTER — Encounter: Payer: Self-pay | Admitting: Vascular Surgery

## 2019-06-15 ENCOUNTER — Ambulatory Visit (HOSPITAL_COMMUNITY)
Admission: RE | Admit: 2019-06-15 | Discharge: 2019-06-15 | Disposition: A | Discharge status: Home / Self Care | Payer: Medicare HMO | Source: Ambulatory Visit | Attending: Vascular Surgery | Admitting: Vascular Surgery

## 2019-06-15 ENCOUNTER — Other Ambulatory Visit: Payer: Self-pay

## 2019-06-15 VITALS — BP 167/95 | HR 65 | Temp 97.5°F | Resp 20 | Ht 72.0 in | Wt 352.0 lb

## 2019-06-15 DIAGNOSIS — N184 Chronic kidney disease, stage 4 (severe): Secondary | ICD-10-CM

## 2019-06-15 NOTE — Progress Notes (Signed)
[facility]ist [facility] [place]  Patient name: Adam Dennis MRN: [id number] DOB: 02-10-1961 Sex: male  REASON FOR CONSULT: [place] access for hemodialysis  HPI: Adam Dennis is a 59 y.o. male, who is here today for discussion of hemodialysis access.  He is left-handed.  He has progressive renal insufficiency and is now stage IV.  We have been requested to place a fistula if possible and wait on a graft.  He does not have any history of catheter placement or pacemaker.  Does have a history of congestive heart failure and hypertension.  Morbidly obese with BMI of 48  Past Medical History:  Diagnosis Date  . Arthritis    "back" (02/06/2017)  . CHF (congestive heart failure) ([place])    new onset /Encounter Date 09/12/2006  . Chronic combined systolic and diastolic heart failure ([place])   . CKD (chronic kidney disease) stage 4, GFR 15-29 ml/min (HCC)   . Glaucoma, right eye   . High cholesterol   . Hypertension   . Hypertrophy of tonsils alone   . Myocardial infarction [facility])    "small one; ~ 2008" (02/06/2017)  . Obesity, unspecified   . On home oxygen therapy    "4L; 24/7" (04/10/2017)  . OSA on CPAP   . Type II diabetes mellitus (HCC)     Family History  Problem Relation Age of Onset  . Hypertension Mother   . Diabetes Brother   . Diabetes Sister   . Cancer Maternal Aunt   . Amblyopia Neg Hx   . Blindness Neg Hx   . Cataracts Neg Hx   . Glaucoma Neg Hx   . Macular degeneration Neg Hx   . Strabismus Neg Hx   . Retinal detachment Neg Hx   . Retinitis pigmentosa Neg Hx     SOCIAL HISTORY: Social History   Socioeconomic History  . Marital status: Single    Spouse name: Not on file  . Number of children: 0  . Years of education: Not on file  . Highest education level: Not on file  Occupational History  . Occupation: works as a Engineer, materials  . Smoking status: Never Smoker  . Smokeless tobacco: Never Used  Substance and  Sexual Activity  . Alcohol use: No  . Drug use: No  . Sexual activity: Not Currently  Other Topics Concern  . Not on file  Social History Narrative  . Not on file   Social Determinants of Health   Financial Resource Strain:   . Difficulty of Paying Living Expenses: Not on file  Food Insecurity:   . Worried About Charity fundraiser in the Last Year: Not on file  . Ran Out of Food in the Last Year: Not on file  Transportation Needs:   . Lack of Transportation (Medical): Not on file  . Lack of Transportation (Non-Medical): Not on file  Physical Activity:   . Days of Exercise per Week: Not on file  . Minutes of Exercise per Session: Not on file  Stress:   . Feeling of Stress : Not on file  Social Connections:   . Frequency of Communication with Friends and Family: Not on file  . Frequency of Social Gatherings with Friends and Family: Not on file  . Attends Religious Services: Not on file  . Active Member of Clubs or Organizations: Not on file  . Attends Archivist Meetings: Not on file  . Marital Status: Not on file  Intimate Partner Violence:   . Fear of Current or Ex-Partner: Not on file  . Emotionally Abused: Not on file  . Physically Abused: Not on file  . Sexually Abused: Not on file    No Known Allergies  Current Outpatient Medications  Medication Sig Dispense Refill  . ACCU-CHEK SOFTCLIX LANCETS lancets Use as instructed to check blood sugar up to 3 times daily. 100 each 11  . albuterol (VENTOLIN HFA) 108 (90 Base) MCG/ACT inhaler INHALE 2 PUFFS INTO THE LUNGS EVERY 6 HOURS AS NEEDED FOR WHEEZING OR SHORTNESS OF BREATH 54 g 0  . allopurinol (ZYLOPRIM) 100 MG tablet Take 1 tablet (100 mg total) by mouth daily. Must have office visit for refills 90 tablet 0  . amLODipine (NORVASC) 10 MG tablet Take 1 tablet (10 mg total) by mouth daily. 90 tablet 1  . aspirin 325 MG tablet Take 325 mg by mouth daily.    Marland Kitchen atorvastatin (LIPITOR) 40 MG tablet TAKE 1 TABLET(40  MG) BY MOUTH DAILY 90 tablet 1  . Blood Glucose Monitoring Suppl (ACCU-CHEK AVIVA) device Use as instructed to check blood sugar up to 3 times daily. 1 each 0  . bumetanide (BUMEX) 2 MG tablet Take 1 tablet (2 mg total) by mouth 2 (two) times daily. 60 tablet 3  . calcitRIOL (ROCALTROL) 0.25 MCG capsule TK 1 C PO D    . calcium carbonate (TUMS - DOSED IN MG ELEMENTAL CALCIUM) 500 MG chewable tablet Chew 1 tablet (200 mg of elemental calcium total) by mouth 2 (two) times daily.    . carvedilol (COREG) 12.5 MG tablet Take 12.5 mg by mouth 2 (two) times daily with a meal.    . cetirizine (ZYRTEC) 10 MG tablet Take 1 tablet (10 mg total) by mouth daily. 30 tablet 11  . Cholecalciferol (VITAMIN D3) 50 MCG (2000 UT) TABS Take 1 tablet by mouth daily.    . ferrous sulfate 325 (65 FE) MG tablet Take 1 tablet (325 mg total) by mouth daily with breakfast. 100 tablet 3  . Fluticasone-Salmeterol (ADVAIR DISKUS) 250-50 MCG/DOSE AEPB Inhale 1 puff into the lungs 2 (two) times daily. 1 each 6  . glucose blood (ACCU-CHEK AVIVA PLUS) test strip Use as instructed to check blood sugar up to 3 times daily. 100 each 11  . hydrALAZINE (APRESOLINE) 25 MG tablet Take 25 mg by mouth 3 (three) times daily.    . isosorbide mononitrate (IMDUR) 30 MG 24 hr tablet Take 1 tablet (30 mg total) by mouth daily. 90 tablet 1  . Lancet Devices (ACCU-CHEK SOFTCLIX) lancets Use as instructed daily. 1 each 5  . nitroGLYCERIN (NITROSTAT) 0.4 MG SL tablet PLACE 1 TABLET BY MOUTH UNDER TONGUE EVERY 5 MINUTES FOR 3 DOSES AS NEEDED FOR CHEST PAIN 25 tablet 0  . timolol (BETIMOL) 0.5 % ophthalmic solution Place 1 drop into both eyes daily.     . timolol (TIMOPTIC-XR) 0.5 % ophthalmic gel-forming INT 1 GTT IN OU IN THE MORNING    . TRAVATAN Z 0.004 % SOLN ophthalmic solution Place 1 drop into both eyes at bedtime.  12   No current facility-administered medications for this visit.    REVIEW OF SYSTEMS:  [X]  denotes positive finding, [ ]   denotes negative finding Cardiac  Comments:  Chest pain or chest pressure: x   Shortness of breath upon exertion: x   Short of breath when lying flat:    Irregular heart rhythm:        Vascular  Pain in calf, thigh, or hip brought on by ambulation:    Pain in feet at night that wakes you up from your sleep:     Blood clot in your veins:    Leg swelling:  x       Pulmonary    Oxygen at home:    Productive cough:     Wheezing:         Neurologic    Sudden weakness in arms or legs:     Sudden numbness in arms or legs:     Sudden onset of difficulty speaking or slurred speech:    Temporary loss of vision in one eye:     Problems with dizziness:         Gastrointestinal    Blood in stool:     Vomited blood:         Genitourinary    Burning when urinating:     Blood in urine:        Psychiatric    Major depression:         Hematologic    Bleeding problems:    Problems with blood clotting too easily:        Skin    Rashes or ulcers:        Constitutional    Fever or chills:      PHYSICAL EXAM: Vitals:   06/15/19 0950  BP: (!) 167/95  Pulse: 65  Resp: 20  Temp: (!) 97.5 F (36.4 C)  SpO2: 94%  Weight: (!) 352 lb (159.7 kg)  Height: 6' (1.829 m)    GENERAL: The patient is a well-nourished male, in no acute distress. The vital signs are documented above. CARDIOVASCULAR: 2+ radial pulses.  Does have visible cephalic antecubital veins. PULMONARY: There is good air exchange  ABDOMEN: Soft and non-tender  MUSCULOSKELETAL: There are no major deformities or cyanosis. NEUROLOGIC: No focal weakness or paresthesias are detected. SKIN: There are no ulcers or rashes noted. PSYCHIATRIC: The patient has a normal affect.  DATA:  Noninvasive studies reveal normal triphasic radial and ulnar pulses bilaterally  Good size cephalic vein bilaterally  MEDICAL ISSUES: Had long discussion with the patient regarding access for hemodialysis.  Discussed catheter placement, AV  fistula and AV graft.  He does appear to be a good candidate for AV fistula creation.  I discussed the potential for nonmaturation.  He is left-handed so would place a left arm AV fistula.  Would reimage his veins at the time of surgery to determine radiocephalic versus brachiocephalic fistula.   Rosetta Posner, MD FACS [facility] Tel [phone] Pager [phone]

## 2019-06-16 ENCOUNTER — Telehealth: Payer: Self-pay | Admitting: *Deleted

## 2019-06-16 NOTE — Telephone Encounter (Signed)
Patient is not on HD at present. He wishes to wait to schedule after office appointment with Nephrologist. Left message with Sahquina at [facility] to call this office if patient needs HD access before Covid restrictions are lifted. Patient aware of my call to Nephrologist.

## 2019-06-18 ENCOUNTER — Telehealth: Payer: Self-pay | Admitting: *Deleted

## 2019-06-18 ENCOUNTER — Other Ambulatory Visit: Payer: Self-pay | Admitting: *Deleted

## 2019-06-18 NOTE — Telephone Encounter (Signed)
Call from Dr. Hollie Salk  Patient needs to be scheduled for permanent HD acces. [facility].

## 2019-06-18 NOTE — Progress Notes (Signed)
Patient instructed to be at [facility] admitting at 7 am or as directed by the hospital on 07/08/2019 for surgery with Dr. Donnetta Hutching. NPO past MN night prior and must have a driver and caregiver for discharge. Patient uses [facility]. I told him he must have someone with him to ride on [facility]. Expect a call and follow the detailed surgery,medication instructions received from the hospital preadmission department. They will schedule time and place for pre-op nasal swab testing for Covid-19. Verbalized understanding.

## 2019-06-21 ENCOUNTER — Other Ambulatory Visit: Payer: Self-pay | Admitting: Family Medicine

## 2019-06-21 ENCOUNTER — Telehealth: Payer: Self-pay | Admitting: Family Medicine

## 2019-06-21 MED ORDER — HYDRALAZINE HCL 25 MG PO TABS: 25.0000 mg | ORAL_TABLET | Freq: Three times a day (TID) | ORAL | 0 refills | Status: DC

## 2019-06-21 NOTE — Telephone Encounter (Signed)
1) Medication(s) Requested (by name):  hydrALAZINE (APRESOLINE) 25 MG tablet   2) Pharmacy of Choice: [facility] [place], [address]   3) Special Requests:   Approved medications will be sent to the pharmacy, we will reach out if there is an issue.  Requests made after 3pm may not be addressed until the following business day!  If a patient is unsure of the name of the medication(s) please note and ask patient to call back when they are able to provide all info, do not send to responsible party until all information is available!

## 2019-06-26 DIAGNOSIS — G4733 Obstructive sleep apnea (adult) (pediatric): Secondary | ICD-10-CM | POA: Diagnosis not present

## 2019-06-28 ENCOUNTER — Ambulatory Visit: Payer: Medicare HMO | Admitting: Family Medicine

## 2019-06-30 ENCOUNTER — Emergency Department (HOSPITAL_COMMUNITY): Payer: Medicare HMO

## 2019-06-30 ENCOUNTER — Inpatient Hospital Stay (HOSPITAL_COMMUNITY)
Admission: EM | Admit: 2019-06-30 | Discharge: 2019-07-07 | DRG: 981 | Disposition: A | Discharge status: Home / Self Care | Payer: Medicare HMO | Attending: Student in an Organized Health Care Education/Training Program | Admitting: Student in an Organized Health Care Education/Training Program

## 2019-06-30 ENCOUNTER — Other Ambulatory Visit: Payer: Self-pay

## 2019-06-30 ENCOUNTER — Encounter (HOSPITAL_COMMUNITY): Payer: Self-pay | Admitting: Emergency Medicine

## 2019-06-30 DIAGNOSIS — R578 Other shock: Secondary | ICD-10-CM | POA: Diagnosis not present

## 2019-06-30 DIAGNOSIS — Z20822 Contact with and (suspected) exposure to covid-19: Secondary | ICD-10-CM | POA: Diagnosis present

## 2019-06-30 DIAGNOSIS — R0989 Other specified symptoms and signs involving the circulatory and respiratory systems: Secondary | ICD-10-CM | POA: Diagnosis not present

## 2019-06-30 DIAGNOSIS — N186 End stage renal disease: Secondary | ICD-10-CM | POA: Diagnosis not present

## 2019-06-30 DIAGNOSIS — J9602 Acute respiratory failure with hypercapnia: Secondary | ICD-10-CM | POA: Diagnosis not present

## 2019-06-30 DIAGNOSIS — J9620 Acute and chronic respiratory failure, unspecified whether with hypoxia or hypercapnia: Secondary | ICD-10-CM | POA: Diagnosis not present

## 2019-06-30 DIAGNOSIS — Z7951 Long term (current) use of inhaled steroids: Secondary | ICD-10-CM

## 2019-06-30 DIAGNOSIS — Z8249 Family history of ischemic heart disease and other diseases of the circulatory system: Secondary | ICD-10-CM

## 2019-06-30 DIAGNOSIS — Z992 Dependence on renal dialysis: Secondary | ICD-10-CM

## 2019-06-30 DIAGNOSIS — I501 Left ventricular failure: Secondary | ICD-10-CM | POA: Diagnosis not present

## 2019-06-30 DIAGNOSIS — Z833 Family history of diabetes mellitus: Secondary | ICD-10-CM

## 2019-06-30 DIAGNOSIS — E785 Hyperlipidemia, unspecified: Secondary | ICD-10-CM | POA: Diagnosis present

## 2019-06-30 DIAGNOSIS — I5043 Acute on chronic combined systolic (congestive) and diastolic (congestive) heart failure: Secondary | ICD-10-CM | POA: Diagnosis present

## 2019-06-30 DIAGNOSIS — E872 Acidosis: Secondary | ICD-10-CM | POA: Diagnosis not present

## 2019-06-30 DIAGNOSIS — Z79899 Other long term (current) drug therapy: Secondary | ICD-10-CM

## 2019-06-30 DIAGNOSIS — H409 Unspecified glaucoma: Secondary | ICD-10-CM | POA: Diagnosis present

## 2019-06-30 DIAGNOSIS — M109 Gout, unspecified: Secondary | ICD-10-CM | POA: Diagnosis present

## 2019-06-30 DIAGNOSIS — F329 Major depressive disorder, single episode, unspecified: Secondary | ICD-10-CM | POA: Diagnosis present

## 2019-06-30 DIAGNOSIS — I5040 Unspecified combined systolic (congestive) and diastolic (congestive) heart failure: Secondary | ICD-10-CM | POA: Diagnosis not present

## 2019-06-30 DIAGNOSIS — D638 Anemia in other chronic diseases classified elsewhere: Secondary | ICD-10-CM | POA: Diagnosis not present

## 2019-06-30 DIAGNOSIS — Z9981 Dependence on supplemental oxygen: Secondary | ICD-10-CM

## 2019-06-30 DIAGNOSIS — I132 Hypertensive heart and chronic kidney disease with heart failure and with stage 5 chronic kidney disease, or end stage renal disease: Secondary | ICD-10-CM | POA: Diagnosis present

## 2019-06-30 DIAGNOSIS — I130 Hypertensive heart and chronic kidney disease with heart failure and stage 1 through stage 4 chronic kidney disease, or unspecified chronic kidney disease: Secondary | ICD-10-CM | POA: Diagnosis not present

## 2019-06-30 DIAGNOSIS — T68XXXA Hypothermia, initial encounter: Secondary | ICD-10-CM | POA: Diagnosis not present

## 2019-06-30 DIAGNOSIS — E1169 Type 2 diabetes mellitus with other specified complication: Secondary | ICD-10-CM | POA: Diagnosis present

## 2019-06-30 DIAGNOSIS — I252 Old myocardial infarction: Secondary | ICD-10-CM

## 2019-06-30 DIAGNOSIS — J449 Chronic obstructive pulmonary disease, unspecified: Secondary | ICD-10-CM | POA: Diagnosis not present

## 2019-06-30 DIAGNOSIS — J9601 Acute respiratory failure with hypoxia: Secondary | ICD-10-CM | POA: Insufficient documentation

## 2019-06-30 DIAGNOSIS — Z452 Encounter for adjustment and management of vascular access device: Secondary | ICD-10-CM | POA: Diagnosis not present

## 2019-06-30 DIAGNOSIS — E119 Type 2 diabetes mellitus without complications: Secondary | ICD-10-CM | POA: Diagnosis not present

## 2019-06-30 DIAGNOSIS — Z6841 Body Mass Index (BMI) 40.0 and over, adult: Secondary | ICD-10-CM

## 2019-06-30 DIAGNOSIS — N179 Acute kidney failure, unspecified: Secondary | ICD-10-CM | POA: Diagnosis present

## 2019-06-30 DIAGNOSIS — R0602 Shortness of breath: Secondary | ICD-10-CM | POA: Diagnosis not present

## 2019-06-30 DIAGNOSIS — E1122 Type 2 diabetes mellitus with diabetic chronic kidney disease: Secondary | ICD-10-CM | POA: Diagnosis not present

## 2019-06-30 DIAGNOSIS — J9611 Chronic respiratory failure with hypoxia: Secondary | ICD-10-CM | POA: Diagnosis not present

## 2019-06-30 DIAGNOSIS — N185 Chronic kidney disease, stage 5: Secondary | ICD-10-CM | POA: Diagnosis not present

## 2019-06-30 DIAGNOSIS — Z9989 Dependence on other enabling machines and devices: Secondary | ICD-10-CM | POA: Diagnosis not present

## 2019-06-30 DIAGNOSIS — Z7982 Long term (current) use of aspirin: Secondary | ICD-10-CM

## 2019-06-30 DIAGNOSIS — R680 Hypothermia, not associated with low environmental temperature: Secondary | ICD-10-CM | POA: Diagnosis not present

## 2019-06-30 DIAGNOSIS — Z794 Long term (current) use of insulin: Secondary | ICD-10-CM | POA: Diagnosis not present

## 2019-06-30 DIAGNOSIS — R34 Anuria and oliguria: Secondary | ICD-10-CM | POA: Diagnosis present

## 2019-06-30 DIAGNOSIS — Z9889 Other specified postprocedural states: Secondary | ICD-10-CM

## 2019-06-30 DIAGNOSIS — R0789 Other chest pain: Secondary | ICD-10-CM | POA: Diagnosis present

## 2019-06-30 DIAGNOSIS — I5020 Unspecified systolic (congestive) heart failure: Secondary | ICD-10-CM | POA: Diagnosis not present

## 2019-06-30 DIAGNOSIS — E662 Morbid (severe) obesity with alveolar hypoventilation: Secondary | ICD-10-CM | POA: Diagnosis not present

## 2019-06-30 DIAGNOSIS — I10 Essential (primary) hypertension: Secondary | ICD-10-CM | POA: Diagnosis not present

## 2019-06-30 DIAGNOSIS — I5033 Acute on chronic diastolic (congestive) heart failure: Secondary | ICD-10-CM | POA: Diagnosis present

## 2019-06-30 DIAGNOSIS — E559 Vitamin D deficiency, unspecified: Secondary | ICD-10-CM | POA: Diagnosis present

## 2019-06-30 DIAGNOSIS — I509 Heart failure, unspecified: Secondary | ICD-10-CM | POA: Insufficient documentation

## 2019-06-30 DIAGNOSIS — I5042 Chronic combined systolic (congestive) and diastolic (congestive) heart failure: Secondary | ICD-10-CM | POA: Diagnosis not present

## 2019-06-30 DIAGNOSIS — N189 Chronic kidney disease, unspecified: Secondary | ICD-10-CM | POA: Diagnosis present

## 2019-06-30 DIAGNOSIS — D631 Anemia in chronic kidney disease: Secondary | ICD-10-CM | POA: Diagnosis not present

## 2019-06-30 DIAGNOSIS — N184 Chronic kidney disease, stage 4 (severe): Secondary | ICD-10-CM | POA: Diagnosis not present

## 2019-06-30 DIAGNOSIS — I5030 Unspecified diastolic (congestive) heart failure: Secondary | ICD-10-CM | POA: Diagnosis not present

## 2019-06-30 DIAGNOSIS — I5031 Acute diastolic (congestive) heart failure: Secondary | ICD-10-CM | POA: Diagnosis not present

## 2019-06-30 HISTORY — DX: Chronic obstructive pulmonary disease, unspecified: J44.9

## 2019-06-30 LAB — POC SARS CORONAVIRUS 2 AG -  ED: SARS Coronavirus 2 Ag: NEGATIVE

## 2019-06-30 LAB — CBC
HCT: 44.5 % (ref 39.0–52.0)
HCT: 45.2 % (ref 39.0–52.0)
Hemoglobin: 13.5 g/dL (ref 13.0–17.0)
Hemoglobin: 13.1 g/dL (ref 13.0–17.0)
MCH: 28.1 pg (ref 26.0–34.0)
MCH: 27.6 pg (ref 26.0–34.0)
MCHC: 30.3 g/dL (ref 30.0–36.0)
MCHC: 29 g/dL — ABNORMAL LOW (ref 30.0–36.0)
MCV: 92.7 fL (ref 80.0–100.0)
MCV: 95.2 fL (ref 80.0–100.0)
Platelets: 184 10*3/uL (ref 150–400)
Platelets: 193 10*3/uL (ref 150–400)
RBC: 4.8 MIL/uL (ref 4.22–5.81)
RBC: 4.75 MIL/uL (ref 4.22–5.81)
RDW: 17.3 % — ABNORMAL HIGH (ref 11.5–15.5)
RDW: 17.5 % — ABNORMAL HIGH (ref 11.5–15.5)
WBC: 5.9 10*3/uL (ref 4.0–10.5)
WBC: 5.8 10*3/uL (ref 4.0–10.5)
nRBC: 0.5 % — ABNORMAL HIGH (ref 0.0–0.2)
nRBC: 0.3 % — ABNORMAL HIGH (ref 0.0–0.2)

## 2019-06-30 LAB — BASIC METABOLIC PANEL
Anion gap: 15 (ref 5–15)
BUN: 44 mg/dL — ABNORMAL HIGH (ref 6–20)
CO2: 24 mmol/L (ref 22–32)
Calcium: 8.1 mg/dL — ABNORMAL LOW (ref 8.9–10.3)
Chloride: 100 mmol/L (ref 98–111)
Creatinine, Ser: 6.8 mg/dL — ABNORMAL HIGH (ref 0.61–1.24)
GFR calc Af Amer: 9 mL/min — ABNORMAL LOW (ref >60)
GFR calc non Af Amer: 8 mL/min — ABNORMAL LOW (ref >60)
Glucose, Bld: 106 mg/dL — ABNORMAL HIGH (ref 70–99)
Potassium: 3.9 mmol/L (ref 3.5–5.1)
Sodium: 139 mmol/L (ref 135–145)

## 2019-06-30 LAB — GLUCOSE, CAPILLARY
Glucose-Capillary: 152 mg/dL — ABNORMAL HIGH (ref 70–99)
Glucose-Capillary: 116 mg/dL — ABNORMAL HIGH (ref 70–99)

## 2019-06-30 LAB — LACTIC ACID, PLASMA
Lactic Acid, Venous: 0.7 mmol/L (ref 0.5–1.9)
Lactic Acid, Venous: 0.7 mmol/L (ref 0.5–1.9)

## 2019-06-30 LAB — HEMOGLOBIN A1C
Hgb A1c MFr Bld: 6.2 % — ABNORMAL HIGH (ref 4.8–5.6)
Mean Plasma Glucose: 131.24 mg/dL

## 2019-06-30 LAB — HEPATIC FUNCTION PANEL
ALT: 12 U/L (ref 0–44)
AST: 17 U/L (ref 15–41)
Albumin: 2.9 g/dL — ABNORMAL LOW (ref 3.5–5.0)
Alkaline Phosphatase: 44 U/L (ref 38–126)
Bilirubin, Direct: 0.3 mg/dL — ABNORMAL HIGH (ref 0.0–0.2)
Indirect Bilirubin: 0.4 mg/dL (ref 0.3–0.9)
Total Bilirubin: 0.7 mg/dL (ref 0.3–1.2)
Total Protein: 8 g/dL (ref 6.5–8.1)

## 2019-06-30 LAB — TROPONIN I (HIGH SENSITIVITY)
Troponin I (High Sensitivity): 14 ng/L (ref <18)
Troponin I (High Sensitivity): 14 ng/L (ref <18)
Troponin I (High Sensitivity): 10 ng/L (ref <18)
Troponin I (High Sensitivity): 12 ng/L (ref <18)

## 2019-06-30 LAB — HIV ANTIBODY (ROUTINE TESTING W REFLEX): HIV Screen 4th Generation wRfx: NONREACTIVE

## 2019-06-30 LAB — SARS CORONAVIRUS 2 (TAT 6-24 HRS): SARS Coronavirus 2: NEGATIVE

## 2019-06-30 LAB — BRAIN NATRIURETIC PEPTIDE: B Natriuretic Peptide: 639.5 pg/mL — ABNORMAL HIGH (ref 0.0–100.0)

## 2019-06-30 MED ORDER — MOMETASONE FURO-FORMOTEROL FUM 200-5 MCG/ACT IN AERO
2.0000 | INHALATION_SPRAY | Freq: Two times a day (BID) | RESPIRATORY_TRACT | Status: DC
  Administered 2019-06-30 – 2019-07-07 (×9): 2 via RESPIRATORY_TRACT
  Filled 2019-06-30 (×2): qty 8.8

## 2019-06-30 MED ORDER — ASPIRIN EC 81 MG PO TBEC
81.0000 mg | DELAYED_RELEASE_TABLET | Freq: Every day | ORAL | Status: DC
  Administered 2019-06-30 – 2019-07-07 (×8): 81 mg via ORAL
  Filled 2019-06-30 (×8): qty 1

## 2019-06-30 MED ORDER — ISOSORBIDE MONONITRATE ER 30 MG PO TB24: 30.0000 mg | ORAL_TABLET | Freq: Every day | ORAL | Status: DC

## 2019-06-30 MED ORDER — VITAMIN D 25 MCG (1000 UNIT) PO TABS
2000.0000 [IU] | ORAL_TABLET | Freq: Every day | ORAL | Status: DC
  Administered 2019-07-01 – 2019-07-02 (×2): 2000 [IU] via ORAL
  Filled 2019-06-30 (×3): qty 2

## 2019-06-30 MED ORDER — ACETAMINOPHEN 325 MG PO TABS
650.0000 mg | ORAL_TABLET | Freq: Four times a day (QID) | ORAL | Status: DC | PRN
  Administered 2019-07-01 – 2019-07-06 (×8): 650 mg via ORAL
  Filled 2019-06-30 (×9): qty 2

## 2019-06-30 MED ORDER — INSULIN ASPART 100 UNIT/ML ~~LOC~~ SOLN: 0.0000 [IU] | Freq: Every day | SUBCUTANEOUS | Status: DC

## 2019-06-30 MED ORDER — ALBUTEROL SULFATE HFA 108 (90 BASE) MCG/ACT IN AERS
2.0000 | INHALATION_SPRAY | Freq: Four times a day (QID) | RESPIRATORY_TRACT | Status: DC
  Administered 2019-06-30 (×2): 2 via RESPIRATORY_TRACT
  Filled 2019-06-30: qty 6.7

## 2019-06-30 MED ORDER — ALBUTEROL SULFATE (2.5 MG/3ML) 0.083% IN NEBU
2.5000 mg | INHALATION_SOLUTION | Freq: Four times a day (QID) | RESPIRATORY_TRACT | Status: DC
  Administered 2019-06-30: 20:00:00 2.5 mg via RESPIRATORY_TRACT
  Filled 2019-06-30: qty 3

## 2019-06-30 MED ORDER — TIMOLOL MALEATE 0.5 % OP SOLN
1.0000 [drp] | Freq: Every day | OPHTHALMIC | Status: DC
  Administered 2019-07-01 – 2019-07-07 (×7): 1 [drp] via OPHTHALMIC
  Filled 2019-06-30: qty 5

## 2019-06-30 MED ORDER — CALCITRIOL 0.25 MCG PO CAPS
0.2500 ug | ORAL_CAPSULE | Freq: Every day | ORAL | Status: DC
  Administered 2019-07-01 – 2019-07-07 (×7): 0.25 ug via ORAL
  Filled 2019-06-30 (×7): qty 1

## 2019-06-30 MED ORDER — LATANOPROST 0.005 % OP SOLN
1.0000 [drp] | Freq: Every day | OPHTHALMIC | Status: DC
  Administered 2019-06-30 – 2019-07-06 (×5): 1 [drp] via OPHTHALMIC
  Filled 2019-06-30 (×2): qty 2.5

## 2019-06-30 MED ORDER — SODIUM CHLORIDE 0.9% FLUSH
3.0000 mL | Freq: Once | INTRAVENOUS | Status: AC
  Administered 2019-06-30: 07:00:00 3 mL via INTRAVENOUS

## 2019-06-30 MED ORDER — FERROUS SULFATE 325 (65 FE) MG PO TABS: 325.0000 mg | ORAL_TABLET | Freq: Every day | ORAL | Status: DC

## 2019-06-30 MED ORDER — CARVEDILOL 12.5 MG PO TABS: 12.5000 mg | ORAL_TABLET | Freq: Two times a day (BID) | ORAL | Status: DC

## 2019-06-30 MED ORDER — ACETAMINOPHEN 650 MG RE SUPP: 650.0000 mg | Freq: Four times a day (QID) | RECTAL | Status: DC | PRN

## 2019-06-30 MED ORDER — NOREPINEPHRINE 4 MG/250ML-% IV SOLN
0.0000 ug/min | INTRAVENOUS | Status: DC
  Filled 2019-06-30: qty 250

## 2019-06-30 MED ORDER — SERTRALINE HCL 25 MG PO TABS
25.0000 mg | ORAL_TABLET | Freq: Every day | ORAL | Status: DC
  Administered 2019-06-30 – 2019-07-07 (×8): 25 mg via ORAL
  Filled 2019-06-30 (×8): qty 1

## 2019-06-30 MED ORDER — ATORVASTATIN CALCIUM 40 MG PO TABS
40.0000 mg | ORAL_TABLET | Freq: Every day | ORAL | Status: DC
  Administered 2019-07-01 – 2019-07-06 (×6): 40 mg via ORAL
  Filled 2019-06-30 (×5): qty 1

## 2019-06-30 MED ORDER — ONDANSETRON HCL 4 MG PO TABS: 4.0000 mg | ORAL_TABLET | Freq: Four times a day (QID) | ORAL | Status: DC | PRN

## 2019-06-30 MED ORDER — ENOXAPARIN SODIUM 30 MG/0.3ML ~~LOC~~ SOLN
30.0000 mg | SUBCUTANEOUS | Status: DC
  Administered 2019-06-30: 14:00:00 30 mg via SUBCUTANEOUS
  Filled 2019-06-30: qty 0.3

## 2019-06-30 MED ORDER — CALCIUM CARBONATE ANTACID 500 MG PO CHEW
1.0000 | CHEWABLE_TABLET | Freq: Two times a day (BID) | ORAL | Status: DC
  Administered 2019-06-30 – 2019-07-07 (×11): 200 mg via ORAL
  Filled 2019-06-30 (×11): qty 1

## 2019-06-30 MED ORDER — HYDRALAZINE HCL 25 MG PO TABS
25.0000 mg | ORAL_TABLET | Freq: Three times a day (TID) | ORAL | Status: DC
  Administered 2019-06-30: 17:00:00 25 mg via ORAL
  Filled 2019-06-30: qty 1

## 2019-06-30 MED ORDER — DEXAMETHASONE SODIUM PHOSPHATE 10 MG/ML IJ SOLN
10.0000 mg | Freq: Once | INTRAMUSCULAR | Status: AC
  Administered 2019-06-30: 11:00:00 10 mg via INTRAVENOUS
  Filled 2019-06-30: qty 1

## 2019-06-30 MED ORDER — LORATADINE 10 MG PO TABS
10.0000 mg | ORAL_TABLET | Freq: Every day | ORAL | Status: DC
  Administered 2019-07-01 – 2019-07-07 (×7): 10 mg via ORAL
  Filled 2019-06-30 (×7): qty 1

## 2019-06-30 MED ORDER — ONDANSETRON HCL 4 MG/2ML IJ SOLN: 4.0000 mg | Freq: Four times a day (QID) | INTRAMUSCULAR | Status: DC | PRN

## 2019-06-30 MED ORDER — INSULIN ASPART 100 UNIT/ML ~~LOC~~ SOLN
0.0000 [IU] | Freq: Three times a day (TID) | SUBCUTANEOUS | Status: DC
  Administered 2019-06-30: 18:00:00 2 [IU] via SUBCUTANEOUS
  Administered 2019-07-04: 0 [IU] via SUBCUTANEOUS

## 2019-06-30 MED ORDER — ALBUTEROL SULFATE (2.5 MG/3ML) 0.083% IN NEBU: 2.5000 mg | INHALATION_SOLUTION | Freq: Four times a day (QID) | RESPIRATORY_TRACT | Status: DC | PRN

## 2019-06-30 MED ORDER — FUROSEMIDE 10 MG/ML IJ SOLN
120.0000 mg | Freq: Once | INTRAVENOUS | Status: AC
  Administered 2019-06-30: 14:00:00 120 mg via INTRAVENOUS
  Filled 2019-06-30: qty 12

## 2019-06-30 MED ORDER — ALBUTEROL SULFATE (2.5 MG/3ML) 0.083% IN NEBU
2.5000 mg | INHALATION_SOLUTION | Freq: Three times a day (TID) | RESPIRATORY_TRACT | Status: DC
  Administered 2019-07-01: 10:00:00 2.5 mg via RESPIRATORY_TRACT

## 2019-06-30 NOTE — Consult Note (Signed)
Reason for Consult:decompensated congestive heart failure Referring Physician: EDP  Adam Dennis is an 59 y.o. male.  Adam Dennis:[id number] is 59 year old male with past medical history is significant for hypertensive heart disease with diastolic dysfunction, history of congestive heart failure secondary to preserved LV systolic function, diabetes mellitus, morbid obesity, obstructive sleep apnea,/obesity hypoventilation syndrome on CPAP at home, chronic kidney disease stage IV, COPD, hyperlipidemia, came to the ER complaining of progressive increasing shortness of breath for last 1 week to the point 4 last 2-3 days his breathing has gone worst with walking a few yards..  Patient does give a history of increasing fluid intake lately.denies excessive salty food intake.  Denies noncompliance to medications.  Denies any fever, chills, cough.  Denies any chest pain.  States he has been followed up by renal service and was scheduled for AV graft next week in anticipation for hemodialysis in near future. patient does give a history of PND, orthopnea and leg swelling. The patient was noted to have mildly elevated BNP.  2 sets of hypersensitivity troponin  I has been negative.  EKG showed no acute ischemic changes.  Past Medical History:  Diagnosis Date  . Arthritis    "back" (02/06/2017)  . CHF (congestive heart failure) ([place])    new onset /Encounter Date 09/12/2006  . Chronic combined systolic and diastolic heart failure ([place])   . CKD (chronic kidney disease) stage 4, GFR 15-29 ml/min (HCC)   . COPD (chronic obstructive pulmonary disease) ([place])   . Glaucoma, right eye   . High cholesterol   . Hypertension   . Hypertrophy of tonsils alone   . Myocardial infarction [facility])    "small one; ~ 2008" (02/06/2017)  . Obesity, unspecified   . On home oxygen therapy    "4L; 24/7" (04/10/2017)  . OSA on CPAP   . Type II diabetes mellitus ([place])     Past Surgical History:  Procedure Laterality Date  . HERNIA REPAIR     . TEE WITHOUT CARDIOVERSION N/A 04/16/2017   Procedure: TRANSESOPHAGEAL ECHOCARDIOGRAM (TEE);  Surgeon: [name], MD;  Location: [facility] ENDOSCOPY;  Service: Cardiovascular;  Laterality: N/A;  . UMBILICAL HERNIA REPAIR  1960s   "when I was little baby"    Family History  Problem Relation Age of Onset  . Hypertension Mother   . Diabetes Brother   . Diabetes Sister   . Cancer Maternal Aunt   . Amblyopia Neg Hx   . Blindness Neg Hx   . Cataracts Neg Hx   . Glaucoma Neg Hx   . Macular degeneration Neg Hx   . Strabismus Neg Hx   . Retinal detachment Neg Hx   . Retinitis pigmentosa Neg Hx     Social History:  reports that he has never smoked. He has never used smokeless tobacco. He reports that he does not drink alcohol or use drugs.  Allergies: No Known Allergies  Medications: I have reviewed the patient's current medications.  Results for orders placed or performed during the hospital encounter of 06/30/19 (from the past 48 hour(s))  Brain natriuretic peptide     Status: Abnormal   Collection Time: 06/30/19  6:23 AM  Result Value Ref Range   B Natriuretic Peptide 639.5 (H) 0.0 - 100.0 pg/mL    Comment: Performed at [facility] [address], [place], [place] [address]  Basic metabolic panel     Status: Abnormal   Collection Time: 06/30/19  6:24 AM  Result Value Ref Range  Sodium 139 135 - 145 mmol/L   Potassium 3.9 3.5 - 5.1 mmol/L   Chloride 100 98 - 111 mmol/L   CO2 24 22 - 32 mmol/L   Glucose, Bld 106 (H) 70 - 99 mg/dL   BUN 44 (H) 6 - 20 mg/dL   Creatinine, Ser 6.80 (H) 0.61 - 1.24 mg/dL   Calcium 8.1 (L) 8.9 - 10.3 mg/dL   GFR calc non Af Amer 8 (L) >60 mL/min   GFR calc Af Amer 9 (L) >60 mL/min   Anion gap 15 5 - 15    Comment: Performed at [facility] [address], [place], [place] [address]  CBC     Status: Abnormal   Collection Time: 06/30/19  6:24 AM  Result Value Ref Range   WBC 5.9 4.0 - 10.5 K/uL   RBC 4.80 4.22 - 5.81 MIL/uL    Hemoglobin 13.5 13.0 - 17.0 g/dL   HCT 44.5 39.0 - 52.0 %   MCV 92.7 80.0 - 100.0 fL   MCH 28.1 26.0 - 34.0 pg   MCHC 30.3 30.0 - 36.0 g/dL   RDW 17.3 (H) 11.5 - 15.5 %   Platelets 184 150 - 400 K/uL   nRBC 0.5 (H) 0.0 - 0.2 %    Comment: Performed at [facility], [place] [address] [place], [place] [address]  Troponin I (High Sensitivity)     Status: None   Collection Time: 06/30/19  6:24 AM  Result Value Ref Range   Troponin I (High Sensitivity) 14 <18 ng/L    Comment: (NOTE) Elevated high sensitivity troponin I (hsTnI) values and significant  changes across serial measurements may suggest ACS but many other  chronic and acute conditions are known to elevate hsTnI results.  Refer to the "Links" section for chest pain algorithms and additional  guidance. Performed at [facility], [place] [address] [place], [place] [address]   Hemoglobin A1c     Status: Abnormal   Collection Time: 06/30/19  6:45 AM  Result Value Ref Range   Hgb A1c MFr Bld 6.2 (H) 4.8 - 5.6 %    Comment: (NOTE) Pre diabetes:          5.7%-6.4% Diabetes:              >6.4% Glycemic control for   <7.0% adults with diabetes    Mean Plasma Glucose 131.24 mg/dL    Comment: Performed at [facility] [address], [place],  [address]  POC SARS Coronavirus 2 Ag-ED -     Status: None   Collection Time: 06/30/19  7:10 AM  Result Value Ref Range   SARS Coronavirus 2 Ag NEGATIVE NEGATIVE    Comment: (NOTE) SARS-CoV-2 antigen NOT DETECTED.  Negative results are presumptive.  Negative results do not preclude SARS-CoV-2 infection and should not be used as the sole basis for treatment or other patient management decisions, including infection  control decisions, particularly in the presence of clinical signs and  symptoms consistent with COVID-19, or in those who have been in contact with the virus.  Negative results must be combined with clinical observations, patient history, and  epidemiological information. The expected result is Negative. Fact Sheet for Patients: PodPark.tn Fact Sheet for Healthcare Providers: GiftContent.is This test is not yet approved or cleared by the [place] FDA and  has been authorized for detection and/or diagnosis of SARS-CoV-2 by FDA under an Emergency Use Authorization (EUA).  This EUA will  remain in effect (meaning this test can be used) for the duration of  the COVID-19 de claration under Section 564(b)(1) of the Act, 21 U.S.C. section 360bbb-3(b)(1), unless the authorization is terminated or revoked sooner.   Troponin I (High Sensitivity)     Status: None   Collection Time: 06/30/19  8:27 AM  Result Value Ref Range   Troponin I (High Sensitivity) 14 <18 ng/L    Comment: (NOTE) Elevated high sensitivity troponin I (hsTnI) values and significant  changes across serial measurements may suggest ACS but many other  chronic and acute conditions are known to elevate hsTnI results.  Refer to the "Links" section for chest pain algorithms and additional  guidance. Performed at [facility], [place] [address], [place], [place] [address]   SARS CORONAVIRUS 2 (TAT 6-24 HRS) Nasopharyngeal Nasopharyngeal Swab     Status: None   Collection Time: 06/30/19  9:30 AM   Specimen: Nasopharyngeal Swab  Result Value Ref Range   SARS Coronavirus 2 NEGATIVE NEGATIVE    Comment: (NOTE) SARS-CoV-2 target nucleic acids are NOT DETECTED. The SARS-CoV-2 RNA is generally detectable in upper and lower respiratory specimens during the acute phase of infection. Negative results do not preclude SARS-CoV-2 infection, do not rule out co-infections with other pathogens, and should not be used as the sole basis for treatment or other patient management decisions. Negative results must be combined with clinical observations, patient history, and epidemiological information. The expected result  is Negative. Fact Sheet for Patients: SugarRoll.be Fact Sheet for Healthcare Providers: https://www.woods-mathews.com/ This test is not yet approved or cleared by the [place] FDA and  has been authorized for detection and/or diagnosis of SARS-CoV-2 by FDA under an Emergency Use Authorization (EUA). This EUA will remain  in effect (meaning this test can be used) for the duration of the COVID-19 declaration under Section 56 4(b)(1) of the Act, 21 U.S.C. section 360bbb-3(b)(1), unless the authorization is terminated or revoked sooner. Performed at [facility], [place] [address], [place], [place] [address]     DG Chest Port 1 View  Result Date: 06/30/2019 CLINICAL DATA:  Worsening shortness of breath and chest tightness. EXAM: PORTABLE CHEST 1 VIEW COMPARISON:  04/08/2017 FINDINGS: Limited by body habitus and soft tissue attenuation. Cardiomegaly with a probable large left diaphragmatic herniation based on 2008 CT. Lung volumes are low and there is hazy density at both lung bases that is primarily attributed to soft tissue attenuation. No Kerley lines, visible effusion, or pneumothorax. IMPRESSION: 1. Cardiomega remote ly and vascular congestion. 2. 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 3. Limited study due to body habitus. Electronically Signed   By: Monte Fantasia M.D.   On: 06/30/2019 07:11    Review of Systems  Constitutional: Negative for chills and diaphoresis.  HENT: Negative for sore throat.   Eyes: Negative for itching.  Respiratory: Positive for shortness of breath. Negative for cough.   Cardiovascular: Positive for leg swelling. Negative for chest pain and palpitations.  Gastrointestinal: Negative for abdominal pain.  Genitourinary: Negative for difficulty urinating and flank pain.  Neurological: Negative for dizziness and syncope.   Blood pressure 115/65, pulse 64, temperature 97.9 F (36.6 C), temperature source Oral,  resp. rate 17, SpO2 91 %. Physical Exam  HENT:  Head: Normocephalic and atraumatic.  Eyes: Pupils are equal, round, and reactive to light. Conjunctivae are normal. Left eye exhibits no discharge. No scleral icterus.  Neck: No JVD present. No tracheal deviation present. No thyromegaly present.  Cardiovascular: Normal rate and regular rhythm.  Murmur (soft systolic murmur and S3 gallop noted) heard. Respiratory:  Decreased breath sounds at bases with faint rales noted  GI: Soft. Bowel sounds are normal. He exhibits distension. There is no abdominal tenderness. There is no rebound.  Musculoskeletal:     Cervical back: Normal range of motion and neck supple.     Comments: No clubbing, cyanosis, 1+ edema noted    Assessment/Plan: Decompensated congestive heart failure secondary to preserved LV systolic function .  Secondary to increased fluid intake/worsening renal function Hypertension. Diabetes mellitus. Acute on chronic kidney disease stage IV. Probably component of cardiorenal syndrome COPD Morbid obesity. Obstructive sleep apnea/obesity hypoventilation syndrome on CPAP. Plan Agree with present management. Avoid hypotension. Discussed with patient regarding compliance with diet, medication and fluid restrictions. May need renal and vascular surgery consult during this admission, although patient scheduled for AV fistula as outpatient next week.  Charolette Forward 06/30/2019, 2:14 PM

## 2019-06-30 NOTE — Progress Notes (Signed)
Patient has not voided since arrival to our unit.  Bladder scan and got 1 ml-18 ml Done by 3 nurses. Paged M.D. on call . Call return and they will review chart and call back

## 2019-06-30 NOTE — Progress Notes (Signed)
Pt temp 92.2 rectally. MD paged. Will place pt on bair hugger. Pt denies needs and/or complaints at this time.  Arletta Bale, RN

## 2019-06-30 NOTE — H&P (Addendum)
Date: 06/30/2019               Patient Name:  Adam Dennis MRN: [id number]  DOB: 12-29-1960 Age / Sex: 59 y.o., male   PCP: Charlott Rakes, MD         Medical Service: Internal Medicine Teaching Service         Attending Physician: Dr. Evette Doffing, Mallie Mussel, *    First Contact: Dr. Marianna Payment Pager: [phone]  Second Contact: Dr. Maricela Bo Pager: [phone]       After Hours (After 5p/  First Contact Pager: [phone]  weekends / holidays): Second Contact Pager: [phone]   Chief Complaint: [facility]  History of Present Illness: Adam Dennis is a 59 y.o male with HFrEF (LVEF now recovered), HTN, OSA, and CKD stage IV-V who presented to the ED with 4 days of progressive DOE. History was obtained via the patient and through chart review.   Approximately 3 to 4 days ago the patient noticed an acute change in his respiratory status. Prior to this he was able to walk up 13 stairs at his house without needing to stop. However over the last 3 to 4 days he is only been able to walk up 5 to 6 steps before feeling the need to stop to catch his breath. In addition to his progressive dyspnea on exertion he is also noticed progressive orthopnea and PND. He states that he sleeps with a CPAP but is unable to catch his breath whenever he lies flat at night. He estimates that over the last 3 to 4 days he is only slept two hours per night. The remainder of the night is leaning over the edge of the bed trying to catch his breath. He has noticed a decrease in his appetite but has not noticed any early satiety, increased abdominal girth, nausea/vomiting. He has also noticed nocturia and worsening lower extremity edema, L > R (this is normal for him). He has been compliant with his home diuretic therapy, Bumex 2mg  twice daily. He does drink a significant amount of fluid per day, estimating over 2 L per day. He does not weigh himself daily.  In regards to his CKD. He follows with Dr. Hollie Salk at [facility]. He is aware that he  is progressing towards dialysis. He was supposed to be admitted next week for an AV fistula creation of the right arm. He is unsure exactly what caused his CKD but thinks he has been told it was related to his obesity, hypertension, and diabetes.  He denies fevers/chills, headaches, sinus congestion/pain, sore throat, cough, wheezing, chest pain, abdominal pain, nausea/vomiting, diarrhea/constipation, dysuria, hematuria, myalgias, arthralgias, new rash, focal neural deficits. He has noticed an occasional palpitation but nothing sustained.  Meds:  No current facility-administered medications on file prior to encounter.   Current Outpatient Medications on File Prior to Encounter  Medication Sig Dispense Refill  . ACCU-CHEK SOFTCLIX LANCETS lancets Use as instructed to check blood sugar up to 3 times daily. 100 each 11  . albuterol (VENTOLIN HFA) 108 (90 Base) MCG/ACT inhaler INHALE 2 PUFFS INTO THE LUNGS EVERY 6 HOURS AS NEEDED FOR WHEEZING OR SHORTNESS OF BREATH 54 g 0  . allopurinol (ZYLOPRIM) 100 MG tablet Take 1 tablet (100 mg total) by mouth daily. Must have office visit for refills 90 tablet 0  . amLODipine (NORVASC) 10 MG tablet Take 1 tablet (10 mg total) by mouth daily. 90 tablet 1  . aspirin 325 MG tablet Take 325 mg  by mouth daily.    Marland Kitchen atorvastatin (LIPITOR) 40 MG tablet TAKE 1 TABLET(40 MG) BY MOUTH DAILY 90 tablet 1  . Blood Glucose Monitoring Suppl (ACCU-CHEK AVIVA) device Use as instructed to check blood sugar up to 3 times daily. 1 each 0  . bumetanide (BUMEX) 2 MG tablet Take 1 tablet (2 mg total) by mouth 2 (two) times daily. 60 tablet 3  . calcitRIOL (ROCALTROL) 0.25 MCG capsule TK 1 C PO D    . calcium carbonate (TUMS - DOSED IN MG ELEMENTAL CALCIUM) 500 MG chewable tablet Chew 1 tablet (200 mg of elemental calcium total) by mouth 2 (two) times daily.    . carvedilol (COREG) 12.5 MG tablet Take 12.5 mg by mouth 2 (two) times daily with a meal.    . cetirizine (ZYRTEC) 10 MG  tablet Take 1 tablet (10 mg total) by mouth daily. 30 tablet 11  . Cholecalciferol (VITAMIN D3) 50 MCG (2000 UT) TABS Take 1 tablet by mouth daily.    . ferrous sulfate 325 (65 FE) MG tablet Take 1 tablet (325 mg total) by mouth daily with breakfast. 100 tablet 3  . Fluticasone-Salmeterol (ADVAIR DISKUS) 250-50 MCG/DOSE AEPB Inhale 1 puff into the lungs 2 (two) times daily. 1 each 6  . glucose blood (ACCU-CHEK AVIVA PLUS) test strip Use as instructed to check blood sugar up to 3 times daily. 100 each 11  . hydrALAZINE (APRESOLINE) 25 MG tablet Take 1 tablet (25 mg total) by mouth 3 (three) times daily. 90 tablet 0  . isosorbide mononitrate (IMDUR) 30 MG 24 hr tablet Take 1 tablet (30 mg total) by mouth daily. 90 tablet 1  . Lancet Devices (ACCU-CHEK SOFTCLIX) lancets Use as instructed daily. 1 each 5  . nitroGLYCERIN (NITROSTAT) 0.4 MG SL tablet PLACE 1 TABLET BY MOUTH UNDER TONGUE EVERY 5 MINUTES FOR 3 DOSES AS NEEDED FOR CHEST PAIN 25 tablet 0  . timolol (BETIMOL) 0.5 % ophthalmic solution Place 1 drop into both eyes daily.     . timolol (TIMOPTIC-XR) 0.5 % ophthalmic gel-forming INT 1 GTT IN OU IN THE MORNING    . TRAVATAN Z 0.004 % SOLN ophthalmic solution Place 1 drop into both eyes at bedtime.  12   Allergies: Allergies as of 06/30/2019  . (No Known Allergies)   Past Medical History:  Diagnosis Date  . Arthritis    "back" (02/06/2017)  . CHF (congestive heart failure) ([place])    new onset /Encounter Date 09/12/2006  . Chronic combined systolic and diastolic heart failure ([place])   . CKD (chronic kidney disease) stage 4, GFR 15-29 ml/min (HCC)   . COPD (chronic obstructive pulmonary disease) ([place])   . Glaucoma, right eye   . High cholesterol   . Hypertension   . Hypertrophy of tonsils alone   . Myocardial infarction [facility])    "small one; ~ 2008" (02/06/2017)  . Obesity, unspecified   . On home oxygen therapy    "4L; 24/7" (04/10/2017)  . OSA on CPAP   . Type II diabetes mellitus  (HCC)    Family History: Patient's mother was on dialysis for a couple years before passing. He believes her kidney damage was due to uncontrolled hypertension and diabetes. To the best of his knowledge no other family members had any chronic medical conditions.  Social History: Previously worked at a [employer] before transitioning to the [employer]. Has worked here for several years however due to [place] has not been working. Primarily lives  a sedentary lifestyle. He is married without any children. His wife is a Training and development officer. He denies use of tobacco products. He is a social drinker. He has remote history of marijuana use but states he has not used that 30 to 40 years.  Review of Systems: A complete ROS was negative except as per HPI.   Physical Exam: Blood pressure 111/71, pulse 80, temperature 97.9 F (36.6 C), temperature source Oral, resp. rate (!) 29, SpO2 95 %.  General: Morbidly obese male in no acute distress HENT: Normocephalic, atraumatic, moist mucus membranes Pulm: Good air movement with bibasilar crackles CV: RRR, no murmurs, no rubs, unable to appreciate JVD due to neck girth  Abdomen: Active bowel sounds, soft, no tenderness to palpation, pitting edema of the lower abdomen  Extremities: Pulses palpable in all extremities, 2+ pitting edema of the LLE, 1+ pitting edema of the RLE Skin: Warm and dry  Neuro: Alert and oriented x 3  EKG: personally reviewed my interpretation is sinus rhythm with normal axis. Normal P wave morphology. No PR prolongation. No new conduction abnormalities. No new ST elevation or T-wave inversion.  CXR: personally reviewed my interpretation is poor rotation, inspiration, and penetration. Enlarged cardiac silhouette, appears to be increased vascular congestion. Bowel gas noticed in the left hemothorax. Unable to definitively say whether he is pleural effusions.  Assessment & Plan by Problem: Principal Problem:   Acute respiratory failure  with hypoxia (HCC) Active Problems:   Morbid obesity (HCC)   Chronic combined systolic and diastolic CHF (congestive heart failure) (HCC)   CKD (chronic kidney disease) stage 4, GFR 15-29 ml/min (HCC)   AKI (acute kidney injury) (HCC)  Mylan Schwarz is a 59 y.o male with HFrEF (LVEF now recovered), HTN, OSA, and CKD stage IV-V who presented to the ED with signs and symptoms of volume overload. In the ED he was found to be hypoxic requiring > 6L/min [place] with an acute on chronic kideny injury. He was subsequently admitted for further evaluation/management of his acute hypoxic respiratory failure.   Acute Hypoxic Respiratory Failure 2/2 acute HF exacerbation  - TTE in 03/2017 with LVEF of 60-65%, mild MR, LAE, and elevated RVSP but no RV dysfunction. Prior TTEs have been inadequate due to body habitus. May need to repeat TEE to assess cardiac function.  - On Bumex 2 mg BID, equivalent to Furosemide 80 mg BID. Will trial furosemide 120 mg and assess response.  - Continue Hydralazine 25 mg TID, Isosorbide mononitrate 30 mg QD, and Carvedilol 6.25 mg BID  - Unable to use ACE / ARB/ ARNI or aldosterone antagonist due to renal function  - No indication for device therapy  - Wean oxygen as tolerated  - Strict I&Os  - Daily weights  - Appreciate cardiology consultation and recommendations  AKI on CKD Stage IV-V CRS type 1 vs type 2 - Progressive decline in renal function of the past 12 months  - No indication for emergent/urgent HD  - May need to discuss patient admission both with vascular surgery and nephrology as he is scheduled next week for an AV fistula creation of the right arm  HTN - On Amlodipine 10 mg QD, Hydralazine 25 mg TID, Isosorbide mononitrate 30 mg QD, and Carvedilol 6.25 mg BID  - Continue Hydralazine 25 mg TID, Isosorbide mononitrate 30 mg QD, and Carvedilol 6.25 mg BID  - Hold amlodipine 10 mg QD for now  OSA on CPAP  - QHS CPAP  DM  - Repeat A1c  -  SSI  MDD - > 2  weeks of increasing depression, lack of energy, guilt, lack of interest, increase sleep/fatigue - Will start Zoloft 25 mg QD   Diet: Heart/Renal  VTE ppx: Dose adjusted lovenox  CODE STATUS: Full Code  Dispo: Admit patient to Inpatient with expected length of stay greater than 2 midnights.  Signed: Ina Homes, MD 06/30/2019, 12:00 PM  Pager: [phone]

## 2019-06-30 NOTE — Progress Notes (Signed)
1840 MD at bedside with patient. MD originally wanted to start Levophed drip.This RN paged rapid response as we cannot start these drips on the unit. Rapid response came to bedside to assess pt.   Pt VS taken per MEWS protocol. Improvements in BP and temperature noted in chart.  1900 Temperature 93.2 rectal, BP 101/51(66), HR 66, O2 sat 96% 6LNC.   Pt denies needs and/or complaints.   Arletta Bale, RN

## 2019-06-30 NOTE — ED Provider Notes (Signed)
Care of the patient assumed at signout. Patient's Covid test point-of-care is negative, formal test pending. Patient is found however, to have worsening renal function as well as evidence for worsening heart failure. I discussed his case with his cardiologist, who will follow as a Optometrist. Given his substantial other medical issues, patient will be admitted to an internal medicine team. With consideration of COPD plus CHF exacerbation in the context of worsening renal function, patient has received steroids, will receive continued albuterol, as well.   Carmin Muskrat, MD 06/30/19 1026

## 2019-06-30 NOTE — ED Notes (Signed)
Pt 59% on RA from triage. Placed on 15L nonrebreather. Sats now 98%+

## 2019-06-30 NOTE — Progress Notes (Signed)
Pt received from ED to [place]. Oriented to room and call bell. CHG wipes applied. VSS. Pt hooked up to wall monitor and CCMD called. Pt denies needs at this time.  Arletta Bale, RN

## 2019-06-30 NOTE — Progress Notes (Signed)
Pt MEWS changed from yellow to red due to change in status. At [place], NT checked pt BP and it was 87/51. This RN rechecked BP and it was 90/52. Other significant VS include rectal temperature 92.8 with pt on bair hugger. HR 60 and O2 sat 88% on 6L [place]. This RN increased O2 to 7L [place] and pt satting 91%. Pt denies complaints at this time. MD intern called at [phone] and stated she would come to the bedside. VS set to sequence per MEWS protocol. Will continue to monitor.   Arletta Bale, RN

## 2019-06-30 NOTE — ED Provider Notes (Signed)
[facility] EMERGENCY DEPARTMENT Provider Note   CSN: [id number] Arrival date & time: 06/30/19  2585     History Chief Complaint  Patient presents with  . COPD/Chest Tightness   Level 5 caveat due to shortness of breath/acuity of condition Adam Dennis is a 59 y.o. male.  The history is provided by the patient.  Shortness of Breath Severity:  Severe Onset quality:  Gradual Duration:  4 days Timing:  Constant Progression:  Worsening Chronicity:  New Relieved by:  Nothing Exacerbated by: lying flat. Associated symptoms: no chest pain, no cough and no fever    Patient history of CHF, COPD, chronic kidney disease presents with shortness of breath.  He reports increasing shortness of breath in the past 4 days.  It is worse with lying flat.  He reports he is unable to sleep No Chest pain.  No fever or cough. He reports he feels like he has fluid in his lungs    Past Medical History:  Diagnosis Date  . Arthritis    "back" (02/06/2017)  . CHF (congestive heart failure) ([place])    new onset /Encounter Date 09/12/2006  . Chronic combined systolic and diastolic heart failure ([place])   . CKD (chronic kidney disease) stage 4, GFR 15-29 ml/min (HCC)   . COPD (chronic obstructive pulmonary disease) ([place])   . Glaucoma, right eye   . High cholesterol   . Hypertension   . Hypertrophy of tonsils alone   . Myocardial infarction [facility])    "small one; ~ 2008" (02/06/2017)  . Obesity, unspecified   . On home oxygen therapy    "4L; 24/7" (04/10/2017)  . OSA on CPAP   . Type II diabetes mellitus [facility])     Patient Active Problem List   Diagnosis Date Noted  . Pain due to onychomycosis of toenails of both feet 11/25/2018  . Gout 05/14/2017  . Hypoxemia   . OSA (obstructive sleep apnea)   . Right flank pain 12/22/2016  . Vitamin D insufficiency 12/03/2015  . Glaucoma of right eye 11/30/2015  . CKD (chronic kidney disease) stage 4, GFR 15-29 ml/min (HCC) 10/04/2015  .  Erectile dysfunction 06/26/2015  . Diabetic nephropathy associated with type 2 diabetes mellitus ([place]) 02/13/2015  . Diabetes mellitus type 2 in obese ([place]) 02/10/2015  . Chronic combined systolic and diastolic CHF (congestive heart failure) ([place]) 02/10/2015  . Depression 02/10/2015  . Tinea pedis 02/10/2015  . Onychomycosis 02/10/2015  . Obesity hypoventilation syndrome ([place]) 02/10/2015  . Essential hypertension, benign 08/16/2013  . Dry skin 02/26/2013  . Hyperlipidemia 11/14/2008  . Morbid obesity ([place]) 03/10/2007  . HYPERTROPHY, TONSILS 03/10/2007    Past Surgical History:  Procedure Laterality Date  . HERNIA REPAIR    . TEE WITHOUT CARDIOVERSION N/A 04/16/2017   Procedure: TRANSESOPHAGEAL ECHOCARDIOGRAM (TEE);  Surgeon: [name], MD;  Location: [facility] ENDOSCOPY;  Service: Cardiovascular;  Laterality: N/A;  . UMBILICAL HERNIA REPAIR  1960s   "when I was little baby"       Family History  Problem Relation Age of Onset  . Hypertension Mother   . Diabetes Brother   . Diabetes Sister   . Cancer Maternal Aunt   . Amblyopia Neg Hx   . Blindness Neg Hx   . Cataracts Neg Hx   . Glaucoma Neg Hx   . Macular degeneration Neg Hx   . Strabismus Neg Hx   . Retinal detachment Neg Hx   . Retinitis pigmentosa Neg Hx  Social History   Tobacco Use  . Smoking status: Never Smoker  . Smokeless tobacco: Never Used  Substance Use Topics  . Alcohol use: No  . Drug use: No    Home Medications Prior to Admission medications   Medication Sig Start Date End Date Taking? Authorizing Provider  ACCU-CHEK SOFTCLIX LANCETS lancets Use as instructed to check blood sugar up to 3 times daily. 06/19/18   Charlott Rakes, MD  albuterol (VENTOLIN HFA) 108 (90 Base) MCG/ACT inhaler INHALE 2 PUFFS INTO THE LUNGS EVERY 6 HOURS AS NEEDED FOR WHEEZING OR SHORTNESS OF BREATH 03/24/19   Charlott Rakes, MD  allopurinol (ZYLOPRIM) 100 MG tablet Take 1 tablet (100 mg total) by mouth daily. Must  have office visit for refills 03/24/19   Argentina Donovan, PA-C  amLODipine (NORVASC) 10 MG tablet Take 1 tablet (10 mg total) by mouth daily. 03/24/19   Argentina Donovan, PA-C  aspirin 325 MG tablet Take 325 mg by mouth daily.    [provider]  atorvastatin (LIPITOR) 40 MG tablet TAKE 1 TABLET(40 MG) BY MOUTH DAILY 03/24/19   Freeman Caldron M, PA-C  Blood Glucose Monitoring Suppl (ACCU-CHEK AVIVA) device Use as instructed to check blood sugar up to 3 times daily. 06/19/18   Charlott Rakes, MD  bumetanide (BUMEX) 2 MG tablet Take 1 tablet (2 mg total) by mouth 2 (two) times daily. 03/24/19   Argentina Donovan, PA-C  calcitRIOL (ROCALTROL) 0.25 MCG capsule TK 1 C PO D 10/30/18   [provider]  calcium carbonate (TUMS - DOSED IN MG ELEMENTAL CALCIUM) 500 MG chewable tablet Chew 1 tablet (200 mg of elemental calcium total) by mouth 2 (two) times daily. 11/27/14   Dixie Dials, MD  carvedilol (COREG) 12.5 MG tablet Take 12.5 mg by mouth 2 (two) times daily with a meal.    [provider]  cetirizine (ZYRTEC) 10 MG tablet Take 1 tablet (10 mg total) by mouth daily. 09/17/18   Argentina Donovan, PA-C  Cholecalciferol (VITAMIN D3) 50 MCG (2000 UT) TABS Take 1 tablet by mouth daily.    [provider]  ferrous sulfate 325 (65 FE) MG tablet Take 1 tablet (325 mg total) by mouth daily with breakfast. 03/24/19   Argentina Donovan, PA-C  Fluticasone-Salmeterol (ADVAIR DISKUS) 250-50 MCG/DOSE AEPB Inhale 1 puff into the lungs 2 (two) times daily. 03/24/19   Argentina Donovan, PA-C  glucose blood (ACCU-CHEK AVIVA PLUS) test strip Use as instructed to check blood sugar up to 3 times daily. 06/19/18   Charlott Rakes, MD  hydrALAZINE (APRESOLINE) 25 MG tablet Take 1 tablet (25 mg total) by mouth 3 (three) times daily. 06/21/19   Charlott Rakes, MD  isosorbide mononitrate (IMDUR) 30 MG 24 hr tablet Take 1 tablet (30 mg total) by mouth daily. 03/24/19   Argentina Donovan, PA-C    Lancet Devices [facility]) lancets Use as instructed daily. 05/14/17   Charlott Rakes, MD  nitroGLYCERIN (NITROSTAT) 0.4 MG SL tablet PLACE 1 TABLET BY MOUTH UNDER TONGUE EVERY 5 MINUTES FOR 3 DOSES AS NEEDED FOR CHEST PAIN 01/29/19   Charlott Rakes, MD  timolol (BETIMOL) 0.5 % ophthalmic solution Place 1 drop into both eyes daily.     [provider]  timolol (TIMOPTIC-XR) 0.5 % ophthalmic gel-forming INT 1 GTT IN OU IN THE MORNING 10/31/18   [provider]  TRAVATAN Z 0.004 % SOLN ophthalmic solution Place 1 drop into both eyes at bedtime. 11/01/15  [provider]    Allergies    Patient has no known allergies.  Review of Systems   Review of Systems  Unable to perform ROS: Acuity of condition  Constitutional: Negative for fever.  Respiratory: Positive for shortness of breath. Negative for cough.   Cardiovascular: Negative for chest pain.    Physical Exam Updated Vital Signs BP 125/80   Pulse 89   Temp 97.9 F (36.6 C) (Oral)   Resp (!) 24   SpO2 100%   Physical Exam CONSTITUTIONAL: Chronically ill-appearing HEAD: Normocephalic/atraumatic EYES: EOMI/PERRL ENMT: Mucous membranes moist NECK: supple no meningeal signs SPINE/BACK:entire spine nontender CV: S1/S2 noted LUNGS: Distant lung sounds, decreased breath sounds bilaterally, tachypneic ABDOMEN: soft, nontender, obese NEURO: Pt is awake/alert/appropriate, moves all extremitiesx4.  No facial droop.   EXTREMITIES: pulses normal/equal, full ROM, edema lower extremities SKIN: warm, color normal PSYCH: no abnormalities of mood noted, alert and oriented to situation  ED Results / Procedures / Treatments   Labs (all labs ordered are listed, but only abnormal results are displayed) Labs Reviewed  RESPIRATORY PANEL BY RT PCR (FLU A&B, COVID)  BASIC METABOLIC PANEL  CBC  BRAIN NATRIURETIC PEPTIDE  TROPONIN I (HIGH SENSITIVITY)    EKG EKG Interpretation  Date/Time:  Wednesday  June 30 2019 06:28:20 EST Ventricular Rate:  73 PR Interval:    QRS Duration: 94 QT Interval:  429 QTC Calculation: 473 R Axis:   75 Text Interpretation: Sinus rhythm Nonspecific T abnormalities, lateral leads Interpretation limited secondary to artifact Confirmed by Ripley Fraise [phone]) on 06/30/2019 6:39:58 AM   Radiology No results found.  Procedures Procedures  Medications Ordered in ED Medications  sodium chloride flush (NS) 0.9 % injection 3 mL (has no administration in time range)    ED Course  I have reviewed the triage vital signs and the nursing notes.  Pertinent labs & imaging results that were available during my care of the patient were reviewed by me and considered in my medical decision making (see chart for details).    MDM Rules/Calculators/A&P                      6:54 AM Patient history of CHF/COPD presents with increasing shortness of breath.  He has a history of chronic kidney disease and is scheduled to have an AV fistula placed in over a week X-ray and labs pending at this time.  Initial pulse ox was 59% but has now improved on a nonrebreather 7:11 AM Signed out to dr lockwood with labs pending Pt will need to be admitted  Final Clinical Impression(s) / ED Diagnoses Final diagnoses:  SOB (shortness of breath)    Rx / DC Orders ED Discharge Orders    None       Ripley Fraise, MD 06/30/19 [phone]

## 2019-06-30 NOTE — Progress Notes (Signed)
Paged about decreased temperature to 92.2, bair hugger placed. Repeat temp was 92.8. BP decreased to 87/41, repeat was 90/52. Went to evaluate at bedside. Patient was awake and able to answer some questions however had decreased responsiveness. He was able to tell me where he was. Reported that he was always cold, denied any light headedness or dizziness. He denied any specific complaints at this time. On exam he still appears very fluid overloaded, bibasilar crackles noted, 2+ pitting edema, mildly elevated JVD. He received 120 IV lasix earlier today, he has not had any output yet. His vitals remained low, with MAP consistently less then 65. Sent page to cardiology and PCCM. Ordered levophed drip however when re-evaluating patient BP improved to 116/54, MAP 70. Discontinued levophed drip for now. BP continued to improve, now up to 126/57 with MAP 78.   Spoke with Dr. Terrence Dupont, he reports that patient recently had echo and ekg in the clinic that was normal. He advised to hold all blood pressure medications, his drop in BP may have been due to his hydralazine and lasix, also has cardio renal syndrome which may be contributing. If EKG is changed then likely will repeat echo in AM. Cycle enzymes for now.   -Obtained EKG, NSR, no ST changes, 1 PVC -Stop metoprolol, hydralazine, and imdur -Obtaining lactic acid, troponin, hepatic function panel -Obtaining blood cultures and urine culture

## 2019-06-30 NOTE — ED Triage Notes (Signed)
Patient reports worsening SOB with chest tightness onset this week , denies cough or fever , no emesis or diaphoresis , history of CHF/COPD.

## 2019-06-30 NOTE — Progress Notes (Signed)
RT set up CPAP with 6L bled into circuit. Patient not ready to go on CPAP at this time. CPAP is at patient's bedside. RT instructed patient to have RT called if assistance is needed. RN is aware. RT will monitor as needed.

## 2019-06-30 NOTE — Significant Event (Addendum)
Rapid Response Event Note  Overview: Asked to come to bedside to start levophed drip d/t hypotension(SBP-80s). On arrival, Dr. Sherry Ruffing at bedside assessing pt. BP-116/54. Pt lethargic but can answer questions and follow commands. Per MD, levophed drip d/c'd and rapid response no longer needed. Asked MD and nursing staff to call RRT if assistance needed in future.      Adam Dennis

## 2019-07-01 ENCOUNTER — Inpatient Hospital Stay (HOSPITAL_COMMUNITY): Payer: Medicare HMO

## 2019-07-01 ENCOUNTER — Encounter (HOSPITAL_COMMUNITY): Payer: Self-pay | Admitting: Student in an Organized Health Care Education/Training Program

## 2019-07-01 ENCOUNTER — Inpatient Hospital Stay (HOSPITAL_COMMUNITY): Payer: Medicare HMO | Admitting: Certified Registered Nurse Anesthetist

## 2019-07-01 ENCOUNTER — Encounter (HOSPITAL_COMMUNITY)
Admission: EM | Disposition: A | Discharge status: Home / Self Care | Payer: Self-pay | Source: Home / Self Care | Attending: Student in an Organized Health Care Education/Training Program

## 2019-07-01 DIAGNOSIS — Z794 Long term (current) use of insulin: Secondary | ICD-10-CM

## 2019-07-01 DIAGNOSIS — E1122 Type 2 diabetes mellitus with diabetic chronic kidney disease: Secondary | ICD-10-CM

## 2019-07-01 DIAGNOSIS — N185 Chronic kidney disease, stage 5: Secondary | ICD-10-CM

## 2019-07-01 DIAGNOSIS — I132 Hypertensive heart and chronic kidney disease with heart failure and with stage 5 chronic kidney disease, or end stage renal disease: Secondary | ICD-10-CM

## 2019-07-01 DIAGNOSIS — I5031 Acute diastolic (congestive) heart failure: Secondary | ICD-10-CM

## 2019-07-01 DIAGNOSIS — N179 Acute kidney failure, unspecified: Secondary | ICD-10-CM

## 2019-07-01 DIAGNOSIS — N186 End stage renal disease: Secondary | ICD-10-CM

## 2019-07-01 DIAGNOSIS — E662 Morbid (severe) obesity with alveolar hypoventilation: Secondary | ICD-10-CM

## 2019-07-01 DIAGNOSIS — E872 Acidosis: Secondary | ICD-10-CM

## 2019-07-01 DIAGNOSIS — I5020 Unspecified systolic (congestive) heart failure: Secondary | ICD-10-CM

## 2019-07-01 DIAGNOSIS — R680 Hypothermia, not associated with low environmental temperature: Secondary | ICD-10-CM

## 2019-07-01 DIAGNOSIS — Z992 Dependence on renal dialysis: Secondary | ICD-10-CM

## 2019-07-01 DIAGNOSIS — E785 Hyperlipidemia, unspecified: Secondary | ICD-10-CM

## 2019-07-01 HISTORY — PX: AV FISTULA PLACEMENT: SHX1204

## 2019-07-01 HISTORY — PX: INSERTION OF DIALYSIS CATHETER: SHX1324

## 2019-07-01 LAB — BLOOD GAS, ARTERIAL
Acid-Base Excess: 2.1 mmol/L — ABNORMAL HIGH (ref 0.0–2.0)
Acid-base deficit: 0.2 mmol/L (ref 0.0–2.0)
Bicarbonate: 28 mmol/L (ref 20.0–28.0)
Bicarbonate: 28.6 mmol/L — ABNORMAL HIGH (ref 20.0–28.0)
Drawn by: 24610
FIO2: 40
FIO2: 60
O2 Saturation: 93.3 %
O2 Saturation: 96.9 %
Patient temperature: 36.6
Patient temperature: 36.9
pCO2 arterial: 83.2 mmHg (ref 32.0–48.0)
pCO2 arterial: 66.6 mmHg (ref 32.0–48.0)
pH, Arterial: 7.15 — CL (ref 7.350–7.450)
pH, Arterial: 7.256 — ABNORMAL LOW (ref 7.350–7.450)
pO2, Arterial: 76.9 mmHg — ABNORMAL LOW (ref 83.0–108.0)
pO2, Arterial: 95 mmHg (ref 83.0–108.0)

## 2019-07-01 LAB — ECHOCARDIOGRAM COMPLETE: Weight: 5714.32 oz

## 2019-07-01 LAB — CBC
HCT: 44.2 % (ref 39.0–52.0)
Hemoglobin: 13.1 g/dL (ref 13.0–17.0)
MCH: 28.1 pg (ref 26.0–34.0)
MCHC: 29.6 g/dL — ABNORMAL LOW (ref 30.0–36.0)
MCV: 94.8 fL (ref 80.0–100.0)
Platelets: 199 10*3/uL (ref 150–400)
RBC: 4.66 MIL/uL (ref 4.22–5.81)
RDW: 17.7 % — ABNORMAL HIGH (ref 11.5–15.5)
WBC: 6.2 10*3/uL (ref 4.0–10.5)
nRBC: 1.1 % — ABNORMAL HIGH (ref 0.0–0.2)

## 2019-07-01 LAB — RENAL FUNCTION PANEL
Albumin: 2.8 g/dL — ABNORMAL LOW (ref 3.5–5.0)
Anion gap: 12 (ref 5–15)
BUN: 53 mg/dL — ABNORMAL HIGH (ref 6–20)
CO2: 26 mmol/L (ref 22–32)
Calcium: 8.1 mg/dL — ABNORMAL LOW (ref 8.9–10.3)
Chloride: 103 mmol/L (ref 98–111)
Creatinine, Ser: 7.51 mg/dL — ABNORMAL HIGH (ref 0.61–1.24)
GFR calc Af Amer: 8 mL/min — ABNORMAL LOW (ref >60)
GFR calc non Af Amer: 7 mL/min — ABNORMAL LOW (ref >60)
Glucose, Bld: 120 mg/dL — ABNORMAL HIGH (ref 70–99)
Phosphorus: 8.8 mg/dL — ABNORMAL HIGH (ref 2.5–4.6)
Potassium: 5.1 mmol/L (ref 3.5–5.1)
Sodium: 141 mmol/L (ref 135–145)

## 2019-07-01 LAB — URINALYSIS, ROUTINE W REFLEX MICROSCOPIC
Bilirubin Urine: NEGATIVE
Glucose, UA: NEGATIVE mg/dL
Hgb urine dipstick: NEGATIVE
Ketones, ur: NEGATIVE mg/dL
Nitrite: NEGATIVE
Protein, ur: >=300 mg/dL — AB
Specific Gravity, Urine: 1.015 (ref 1.005–1.030)
pH: 5 (ref 5.0–8.0)

## 2019-07-01 LAB — GLUCOSE, CAPILLARY
Glucose-Capillary: 115 mg/dL — ABNORMAL HIGH (ref 70–99)
Glucose-Capillary: 92 mg/dL (ref 70–99)
Glucose-Capillary: 110 mg/dL — ABNORMAL HIGH (ref 70–99)
Glucose-Capillary: 86 mg/dL (ref 70–99)
Glucose-Capillary: 91 mg/dL (ref 70–99)

## 2019-07-01 LAB — HEPATITIS B CORE ANTIBODY, TOTAL: Hep B Core Total Ab: NONREACTIVE

## 2019-07-01 SURGERY — INSERTION OF DIALYSIS CATHETER
Anesthesia: Monitor Anesthesia Care | Site: Arm Upper | Laterality: Right

## 2019-07-01 MED ORDER — LIDOCAINE-EPINEPHRINE 1 %-1:100000 IJ SOLN
INTRAMUSCULAR | Status: AC
  Filled 2019-07-01: qty 1

## 2019-07-01 MED ORDER — HEPARIN SODIUM (PORCINE) 1000 UNIT/ML IJ SOLN
INTRAMUSCULAR | Status: DC | PRN
  Administered 2019-07-01: 3000 [IU] via INTRAVENOUS

## 2019-07-01 MED ORDER — FUROSEMIDE 10 MG/ML IJ SOLN
160.0000 mg | Freq: Three times a day (TID) | INTRAVENOUS | Status: DC
  Administered 2019-07-01 – 2019-07-02 (×2): 160 mg via INTRAVENOUS
  Filled 2019-07-01 (×5): qty 16

## 2019-07-01 MED ORDER — PROPOFOL 500 MG/50ML IV EMUL
INTRAVENOUS | Status: DC | PRN
  Administered 2019-07-01: 40 ug/kg/min via INTRAVENOUS

## 2019-07-01 MED ORDER — SODIUM CHLORIDE 0.9 % IV SOLN
1.5000 g | INTRAVENOUS | Status: AC
  Administered 2019-07-01: 1.5 g via INTRAVENOUS
  Filled 2019-07-01 (×2): qty 1.5

## 2019-07-01 MED ORDER — SODIUM CHLORIDE 0.9 % IV SOLN
INTRAVENOUS | Status: AC
  Filled 2019-07-01: qty 1.2

## 2019-07-01 MED ORDER — CHLORHEXIDINE GLUCONATE CLOTH 2 % EX PADS
6.0000 | MEDICATED_PAD | Freq: Every day | CUTANEOUS | Status: DC
  Administered 2019-07-02 – 2019-07-04 (×3): 6 via TOPICAL

## 2019-07-01 MED ORDER — CHLORHEXIDINE GLUCONATE CLOTH 2 % EX PADS
6.0000 | MEDICATED_PAD | Freq: Every day | CUTANEOUS | Status: DC
  Administered 2019-07-01: 10:00:00 6 via TOPICAL

## 2019-07-01 MED ORDER — SODIUM CHLORIDE 0.9 % IV SOLN: 100.0000 mL | INTRAVENOUS | Status: DC | PRN

## 2019-07-01 MED ORDER — FENTANYL CITRATE (PF) 250 MCG/5ML IJ SOLN
INTRAMUSCULAR | Status: AC
  Filled 2019-07-01: qty 5

## 2019-07-01 MED ORDER — PROPOFOL 10 MG/ML IV BOLUS
INTRAVENOUS | Status: DC | PRN
  Administered 2019-07-01: 20 mg via INTRAVENOUS

## 2019-07-01 MED ORDER — LIDOCAINE-PRILOCAINE 2.5-2.5 % EX CREA
1.0000 "application " | TOPICAL_CREAM | CUTANEOUS | Status: DC | PRN
  Filled 2019-07-01: qty 5

## 2019-07-01 MED ORDER — HEPARIN SODIUM (PORCINE) 1000 UNIT/ML IJ SOLN
INTRAMUSCULAR | Status: AC
  Administered 2019-07-01: 18:00:00 3000 [IU] via INTRAVENOUS_CENTRAL
  Filled 2019-07-01: qty 3

## 2019-07-01 MED ORDER — LIDOCAINE 2% (20 MG/ML) 5 ML SYRINGE
INTRAMUSCULAR | Status: DC | PRN
  Administered 2019-07-01: 100 mg via INTRAVENOUS

## 2019-07-01 MED ORDER — PERFLUTREN LIPID MICROSPHERE
1.0000 mL | INTRAVENOUS | Status: AC | PRN
  Administered 2019-07-01: 09:00:00 3.5 mL via INTRAVENOUS
  Filled 2019-07-01: qty 10

## 2019-07-01 MED ORDER — HEPARIN SODIUM (PORCINE) 1000 UNIT/ML DIALYSIS
1000.0000 [IU] | INTRAMUSCULAR | Status: DC | PRN
  Administered 2019-07-06: 3000 [IU] via INTRAVENOUS_CENTRAL
  Filled 2019-07-01 (×4): qty 1

## 2019-07-01 MED ORDER — LIDOCAINE HCL (PF) 1 % IJ SOLN: 5.0000 mL | INTRAMUSCULAR | Status: DC | PRN

## 2019-07-01 MED ORDER — 0.9 % SODIUM CHLORIDE (POUR BTL) OPTIME
TOPICAL | Status: DC | PRN
  Administered 2019-07-01: 12:00:00 1000 mL

## 2019-07-01 MED ORDER — LIDOCAINE-EPINEPHRINE (PF) 1 %-1:200000 IJ SOLN
INTRAMUSCULAR | Status: DC | PRN
  Administered 2019-07-01: 24 mL

## 2019-07-01 MED ORDER — PENTAFLUOROPROP-TETRAFLUOROETH EX AERO: 1.0000 "application " | INHALATION_SPRAY | CUTANEOUS | Status: DC | PRN

## 2019-07-01 MED ORDER — SODIUM CHLORIDE 0.9 % IV SOLN
INTRAVENOUS | Status: DC | PRN
  Administered 2019-07-01: 500 mL

## 2019-07-01 MED ORDER — HEPARIN SODIUM (PORCINE) 5000 UNIT/ML IJ SOLN
5000.0000 [IU] | Freq: Three times a day (TID) | INTRAMUSCULAR | Status: DC
  Administered 2019-07-01 – 2019-07-07 (×17): 5000 [IU] via SUBCUTANEOUS
  Filled 2019-07-01 (×15): qty 1

## 2019-07-01 MED ORDER — PROPOFOL 10 MG/ML IV BOLUS
INTRAVENOUS | Status: AC
  Filled 2019-07-01: qty 40

## 2019-07-01 MED ORDER — PHENYLEPHRINE 40 MCG/ML (10ML) SYRINGE FOR IV PUSH (FOR BLOOD PRESSURE SUPPORT)
PREFILLED_SYRINGE | INTRAVENOUS | Status: DC | PRN
  Administered 2019-07-01 (×2): 40 ug via INTRAVENOUS

## 2019-07-01 MED ORDER — LIDOCAINE HCL (PF) 1 % IJ SOLN
INTRAMUSCULAR | Status: AC
  Filled 2019-07-01: qty 30

## 2019-07-01 MED ORDER — DEXTROSE 5 % IV SOLN
5.0000 mg | Freq: Once | INTRAVENOUS | Status: AC
  Administered 2019-07-01: 02:00:00 5 mg via INTRAVENOUS
  Filled 2019-07-01: qty 20

## 2019-07-01 MED ORDER — HEPARIN SODIUM (PORCINE) 1000 UNIT/ML IJ SOLN
INTRAMUSCULAR | Status: AC
  Filled 2019-07-01: qty 1

## 2019-07-01 SURGICAL SUPPLY — 54 items
ADH SKN CLS APL DERMABOND .7 (GAUZE/BANDAGES/DRESSINGS) ×2
ARMBAND PINK RESTRICT EXTREMIT (MISCELLANEOUS) ×3 IMPLANT
BAG DECANTER FOR FLEXI CONT (MISCELLANEOUS) ×3 IMPLANT
BIOPATCH RED 1 DISK 7.0 (GAUZE/BANDAGES/DRESSINGS) ×2 IMPLANT
BIOPATCH RED 1IN DISK 7.0MM (GAUZE/BANDAGES/DRESSINGS) ×1
CANISTER SUCT 3000ML PPV (MISCELLANEOUS) ×3 IMPLANT
CATH PALINDROME RT-P 15FX19CM (CATHETERS) ×2 IMPLANT
CATH PALINDROME RT-P 15FX23CM (CATHETERS) IMPLANT
CATH PALINDROME RT-P 15FX28CM (CATHETERS) IMPLANT
CATH PALINDROME RT-P 15FX55CM (CATHETERS) IMPLANT
CLIP VESOCCLUDE MED 6/CT (CLIP) ×3 IMPLANT
CLIP VESOCCLUDE SM WIDE 6/CT (CLIP) ×3 IMPLANT
COVER PROBE W GEL 5X96 (DRAPES) ×3 IMPLANT
COVER SURGICAL LIGHT HANDLE (MISCELLANEOUS) ×3 IMPLANT
COVER WAND RF STERILE (DRAPES) ×3 IMPLANT
DERMABOND ADVANCED (GAUZE/BANDAGES/DRESSINGS) ×4
DERMABOND ADVANCED .7 DNX12 (GAUZE/BANDAGES/DRESSINGS) ×1 IMPLANT
DRAPE C-ARM 42X72 X-RAY (DRAPES) ×3 IMPLANT
DRAPE CHEST BREAST 15X10 FENES (DRAPES) ×3 IMPLANT
DRAPE ORTHO SPLIT 77X108 STRL (DRAPES) ×3
DRAPE SURG ORHT 6 SPLT 77X108 (DRAPES) IMPLANT
ELECT REM PT RETURN 9FT ADLT (ELECTROSURGICAL) ×3
ELECTRODE REM PT RTRN 9FT ADLT (ELECTROSURGICAL) ×1 IMPLANT
GAUZE 4X4 16PLY RFD (DISPOSABLE) ×3 IMPLANT
GLOVE BIO SURGEON STRL SZ7.5 (GLOVE) ×3 IMPLANT
GOWN STRL REUS W/ TWL LRG LVL3 (GOWN DISPOSABLE) ×2 IMPLANT
GOWN STRL REUS W/ TWL XL LVL3 (GOWN DISPOSABLE) ×1 IMPLANT
GOWN STRL REUS W/TWL LRG LVL3 (GOWN DISPOSABLE) ×6
GOWN STRL REUS W/TWL XL LVL3 (GOWN DISPOSABLE) ×3
INSERT FOGARTY SM (MISCELLANEOUS) IMPLANT
KIT BASIN OR (CUSTOM PROCEDURE TRAY) ×3 IMPLANT
KIT TURNOVER KIT B (KITS) ×3 IMPLANT
NDL 18GX1X1/2 (RX/OR ONLY) (NEEDLE) ×1 IMPLANT
NDL HYPO 25GX1X1/2 BEV (NEEDLE) ×1 IMPLANT
NEEDLE 18GX1X1/2 (RX/OR ONLY) (NEEDLE) ×3 IMPLANT
NEEDLE HYPO 25GX1X1/2 BEV (NEEDLE) ×3 IMPLANT
NS IRRIG 1000ML POUR BTL (IV SOLUTION) ×3 IMPLANT
PACK CV ACCESS (CUSTOM PROCEDURE TRAY) ×3 IMPLANT
PACK SURGICAL SETUP 50X90 (CUSTOM PROCEDURE TRAY) ×3 IMPLANT
PAD ARMBOARD 7.5X6 YLW CONV (MISCELLANEOUS) ×6 IMPLANT
SOAP 2 % CHG 4 OZ (WOUND CARE) ×3 IMPLANT
SUT ETHILON 3 0 PS 1 (SUTURE) ×3 IMPLANT
SUT MNCRL AB 4-0 PS2 18 (SUTURE) ×3 IMPLANT
SUT PROLENE 6 0 BV (SUTURE) ×3 IMPLANT
SUT VIC AB 3-0 SH 27 (SUTURE) ×3
SUT VIC AB 3-0 SH 27X BRD (SUTURE) ×1 IMPLANT
SYR 10ML LL (SYRINGE) ×3 IMPLANT
SYR 20ML LL LF (SYRINGE) ×6 IMPLANT
SYR 5ML LL (SYRINGE) ×3 IMPLANT
SYR CONTROL 10ML LL (SYRINGE) ×3 IMPLANT
TOWEL GREEN STERILE (TOWEL DISPOSABLE) ×3 IMPLANT
TOWEL GREEN STERILE FF (TOWEL DISPOSABLE) ×6 IMPLANT
UNDERPAD 30X30 (UNDERPADS AND DIAPERS) ×3 IMPLANT
WATER STERILE IRR 1000ML POUR (IV SOLUTION) ×3 IMPLANT

## 2019-07-01 NOTE — Progress Notes (Signed)
CRITICAL VALUE ALERT  Critical Value:  pH 7.1  Date & Time Notied:  07/01/2019 0910  Provider Notified: Dr. Marianna Payment  Orders Received/Actions taken: No orders at this time

## 2019-07-01 NOTE — Progress Notes (Addendum)
Per Dr. Ronnald Ramp order. Inserted Foley F16 patient tol well. 50 ml of urine obtain. Only one attempt .Dr. Ronnald Ramp was text page the results

## 2019-07-01 NOTE — Progress Notes (Signed)
Repeat ABG drawn and sent to lab.  I spoke w/ Cherone in lab to notify the ABG has been sent.    HHN not done at this time d/t ECHO testing in progress.

## 2019-07-01 NOTE — Consult Note (Signed)
[facility]  HISTORY AND PHYSICAL  Adam Dennis is an 59 y.o. male.    Chief Complaint: SOB and fluid overload  HPI: Pt is a 3M with CKD V, CHF, OSA, obesity, COPD who I follow in clinic.  Last visit was late Jan.  Was getting prepared for starting HD and was supposed to get an AVF with Dr. Donnetta Hutching 2/11 in preparation.  Last eGFR was around 15%.  Comes in with acute hypoxic and hypercarbic RF, volume overload, progressive renal failure.    Pt is on BiPaP.  Knows he needs to start HD.  He's getting somewhat emotional about it but understands.  VVS to place catheter and AVF today.  Got IV Lasix last night in ED, none yet today.     PMH: Past Medical History:  Diagnosis Date  . Arthritis    "back" (02/06/2017)  . CHF (congestive heart failure) ([place])    new onset /Encounter Date 09/12/2006  . Chronic combined systolic and diastolic heart failure ([place])   . CKD (chronic kidney disease) stage 4, GFR 15-29 ml/min (HCC)   . COPD (chronic obstructive pulmonary disease) ([place])   . Glaucoma, right eye   . High cholesterol   . Hypertension   . Hypertrophy of tonsils alone   . Myocardial infarction [facility])    "small one; ~ 2008" (02/06/2017)  . Obesity, unspecified   . On home oxygen therapy    "4L; 24/7" (04/10/2017)  . OSA on CPAP   . Type II diabetes mellitus (HCC)    PSH: Past Surgical History:  Procedure Laterality Date  . HERNIA REPAIR    . TEE WITHOUT CARDIOVERSION N/A 04/16/2017   Procedure: TRANSESOPHAGEAL ECHOCARDIOGRAM (TEE);  Surgeon: [name], MD;  Location: [facility] ENDOSCOPY;  Service: Cardiovascular;  Laterality: N/A;  . UMBILICAL HERNIA REPAIR  1960s   "when I was little baby"    Past Medical History:  Diagnosis Date  . Arthritis    "back" (02/06/2017)  . CHF (congestive heart failure) ([place])    new onset /Encounter Date 09/12/2006  . Chronic combined systolic and diastolic heart failure ([place])   . CKD (chronic kidney disease) stage 4, GFR 15-29 ml/min  (HCC)   . COPD (chronic obstructive pulmonary disease) ([place])   . Glaucoma, right eye   . High cholesterol   . Hypertension   . Hypertrophy of tonsils alone   . Myocardial infarction [facility])    "small one; ~ 2008" (02/06/2017)  . Obesity, unspecified   . On home oxygen therapy    "4L; 24/7" (04/10/2017)  . OSA on CPAP   . Type II diabetes mellitus (HCC)     Medications:   Scheduled: . aspirin EC  81 mg Oral Daily  . atorvastatin  40 mg Oral q1800  . calcitRIOL  0.25 mcg Oral Daily  . calcium carbonate  1 tablet Oral BID  . Chlorhexidine Gluconate Cloth  6 each Topical Daily  . cholecalciferol  2,000 Units Oral Daily  . [START ON 07/02/2019] ferrous sulfate  325 mg Oral Q breakfast  . heparin injection (subcutaneous)  5,000 Units Subcutaneous Q8H  . insulin aspart  0-5 Units Subcutaneous QHS  . insulin aspart  0-9 Units Subcutaneous TID WC  . latanoprost  1 drop Both Eyes QHS  . loratadine  10 mg Oral Daily  . mometasone-formoterol  2 puff Inhalation BID  . sertraline  25 mg Oral Daily  . timolol  1 drop Both Eyes Daily  Medications Prior to Admission  Medication Sig Dispense Refill  . albuterol (VENTOLIN HFA) 108 (90 Base) MCG/ACT inhaler INHALE 2 PUFFS INTO THE LUNGS EVERY 6 HOURS AS NEEDED FOR WHEEZING OR SHORTNESS OF BREATH (Patient taking differently: Inhale 2 puffs into the lungs every 6 (six) hours as needed for wheezing or shortness of breath. ) 54 g 0  . allopurinol (ZYLOPRIM) 100 MG tablet Take 1 tablet (100 mg total) by mouth daily. Must have office visit for refills (Patient taking differently: Take 100 mg by mouth daily. ) 90 tablet 0  . amLODipine (NORVASC) 10 MG tablet Take 1 tablet (10 mg total) by mouth daily. 90 tablet 1  . aspirin 325 MG tablet Take 325 mg by mouth daily.    Marland Kitchen atorvastatin (LIPITOR) 40 MG tablet TAKE 1 TABLET(40 MG) BY MOUTH DAILY (Patient taking differently: Take 40 mg by mouth daily. ) 90 tablet 1  . bumetanide (BUMEX) 2 MG tablet Take 1  tablet (2 mg total) by mouth 2 (two) times daily. 60 tablet 3  . calcitRIOL (ROCALTROL) 0.25 MCG capsule Take 0.25 mcg by mouth daily.     . calcium carbonate (TUMS - DOSED IN MG ELEMENTAL CALCIUM) 500 MG chewable tablet Chew 1 tablet (200 mg of elemental calcium total) by mouth 2 (two) times daily.    . carvedilol (COREG) 12.5 MG tablet Take 12.5 mg by mouth 2 (two) times daily with a meal.    . cetirizine (ZYRTEC) 10 MG tablet Take 1 tablet (10 mg total) by mouth daily. 30 tablet 11  . Cholecalciferol (VITAMIN D3) 50 MCG (2000 UT) TABS Take 1 tablet by mouth daily.    . ferrous sulfate 325 (65 FE) MG tablet Take 1 tablet (325 mg total) by mouth daily with breakfast. 100 tablet 3  . Fluticasone-Salmeterol (ADVAIR DISKUS) 250-50 MCG/DOSE AEPB Inhale 1 puff into the lungs 2 (two) times daily. 1 each 6  . hydrALAZINE (APRESOLINE) 25 MG tablet Take 1 tablet (25 mg total) by mouth 3 (three) times daily. 90 tablet 0  . isosorbide mononitrate (IMDUR) 30 MG 24 hr tablet Take 1 tablet (30 mg total) by mouth daily. 90 tablet 1  . nitroGLYCERIN (NITROSTAT) 0.4 MG SL tablet PLACE 1 TABLET BY MOUTH UNDER TONGUE EVERY 5 MINUTES FOR 3 DOSES AS NEEDED FOR CHEST PAIN (Patient taking differently: Place 0.4 mg under the tongue every 5 (five) minutes as needed for chest pain. ) 25 tablet 0  . timolol (TIMOPTIC-XR) 0.5 % ophthalmic gel-forming Place 1 drop into both eyes daily.     . TRAVATAN Z 0.004 % SOLN ophthalmic solution Place 1 drop into both eyes at bedtime.  12  . ACCU-CHEK SOFTCLIX LANCETS lancets Use as instructed to check blood sugar up to 3 times daily. 100 each 11  . Blood Glucose Monitoring Suppl (ACCU-CHEK AVIVA) device Use as instructed to check blood sugar up to 3 times daily. 1 each 0  . glucose blood (ACCU-CHEK AVIVA PLUS) test strip Use as instructed to check blood sugar up to 3 times daily. 100 each 11  . Lancet Devices (ACCU-CHEK SOFTCLIX) lancets Use as instructed daily. 1 each 5     ALLERGIES:  No Known Allergies  FAM HX: Family History  Problem Relation Age of Onset  . Hypertension Mother   . Diabetes Brother   . Diabetes Sister   . Cancer Maternal Aunt   . Amblyopia Neg Hx   . Blindness Neg Hx   . Cataracts Neg Hx   .  Glaucoma Neg Hx   . Macular degeneration Neg Hx   . Strabismus Neg Hx   . Retinal detachment Neg Hx   . Retinitis pigmentosa Neg Hx     Social History:   reports that he has never smoked. He has never used smokeless tobacco. He reports that he does not drink alcohol or use drugs.  ROS: ROS: all systems reviewed and are negative except as per HPI  Blood pressure (!) 136/121, pulse 83, temperature 97.9 F (36.6 C), temperature source Oral, resp. rate (!) 27, weight (!) 162 kg, SpO2 97 %. PHYSICAL EXAM: Physical Exam  GEN: lying in bed on BiPaP HEENT EOMI PERRL NECK + JVD PULM somewhat increased WOB, crackles posteriorly CV RRR loud S2 ABD obese EXT 1+ LE edema NEURO AAO x 3 nonfocal SKIN no rashes or lesions MSK no effusions   Results for orders placed or performed during the hospital encounter of 06/30/19 (from the past 48 hour(s))  Brain natriuretic peptide     Status: Abnormal   Collection Time: 06/30/19  6:23 AM  Result Value Ref Range   B Natriuretic Peptide 639.5 (H) 0.0 - 100.0 pg/mL    Comment: Performed at [facility] [address], [place], [place] [address]  Basic metabolic panel     Status: Abnormal   Collection Time: 06/30/19  6:24 AM  Result Value Ref Range   Sodium 139 135 - 145 mmol/L   Potassium 3.9 3.5 - 5.1 mmol/L   Chloride 100 98 - 111 mmol/L   CO2 24 22 - 32 mmol/L   Glucose, Bld 106 (H) 70 - 99 mg/dL   BUN 44 (H) 6 - 20 mg/dL   Creatinine, Ser 6.80 (H) 0.61 - 1.24 mg/dL   Calcium 8.1 (L) 8.9 - 10.3 mg/dL   GFR calc non Af Amer 8 (L) >60 mL/min   GFR calc Af Amer 9 (L) >60 mL/min   Anion gap 15 5 - 15    Comment: Performed at [facility] [address] [place], [place]  [address]  CBC     Status: Abnormal   Collection Time: 06/30/19  6:24 AM  Result Value Ref Range   WBC 5.9 4.0 - 10.5 K/uL   RBC 4.80 4.22 - 5.81 MIL/uL   Hemoglobin 13.5 13.0 - 17.0 g/dL   HCT 44.5 39.0 - 52.0 %   MCV 92.7 80.0 - 100.0 fL   MCH 28.1 26.0 - 34.0 pg   MCHC 30.3 30.0 - 36.0 g/dL   RDW 17.3 (H) 11.5 - 15.5 %   Platelets 184 150 - 400 K/uL   nRBC 0.5 (H) 0.0 - 0.2 %    Comment: Performed at [facility], [place] [address] [place], [place] [address]  Troponin I (High Sensitivity)     Status: None   Collection Time: 06/30/19  6:24 AM  Result Value Ref Range   Troponin I (High Sensitivity) 14 <18 ng/L    Comment: (NOTE) Elevated high sensitivity troponin I (hsTnI) values and significant  changes across serial measurements may suggest ACS but many other  chronic and acute conditions are known to elevate hsTnI results.  Refer to the "Links" section for chest pain algorithms and additional  guidance. Performed at [facility], [place] [address] [place], [place] [address]   HIV Antibody (routine testing w rflx)     Status: None   Collection Time: 06/30/19  6:45 AM  Result Value Ref Range  HIV Screen 4th Generation wRfx NON REACTIVE NON REACTIVE    Comment: Performed at [facility], [place] [address] [place], [place] [address]  Hemoglobin A1c     Status: Abnormal   Collection Time: 06/30/19  6:45 AM  Result Value Ref Range   Hgb A1c MFr Bld 6.2 (H) 4.8 - 5.6 %    Comment: (NOTE) Pre diabetes:          5.7%-6.4% Diabetes:              >6.4% Glycemic control for   <7.0% adults with diabetes    Mean Plasma Glucose 131.24 mg/dL    Comment: Performed at [facility] [address] [place], [place] [address]  POC SARS Coronavirus 2 Ag-ED -     Status: None   Collection Time: 06/30/19  7:10 AM  Result Value Ref Range   SARS Coronavirus 2 Ag NEGATIVE NEGATIVE    Comment: (NOTE) SARS-CoV-2 antigen NOT DETECTED.  Negative results are presumptive.   Negative results do not preclude SARS-CoV-2 infection and should not be used as the sole basis for treatment or other patient management decisions, including infection  control decisions, particularly in the presence of clinical signs and  symptoms consistent with COVID-19, or in those who have been in contact with the virus.  Negative results must be combined with clinical observations, patient history, and epidemiological information. The expected result is Negative. Fact Sheet for Patients: PodPark.tn Fact Sheet for Healthcare Providers: GiftContent.is This test is not yet approved or cleared by the [place] FDA and  has been authorized for detection and/or diagnosis of SARS-CoV-2 by FDA under an Emergency Use Authorization (EUA).  This EUA will remain in effect (meaning this test can be used) for the duration of  the COVID-19 de claration under Section 564(b)(1) of the Act, 21 U.S.C. section 360bbb-3(b)(1), unless the authorization is terminated or revoked sooner.   Troponin I (High Sensitivity)     Status: None   Collection Time: 06/30/19  8:27 AM  Result Value Ref Range   Troponin I (High Sensitivity) 14 <18 ng/L    Comment: (NOTE) Elevated high sensitivity troponin I (hsTnI) values and significant  changes across serial measurements may suggest ACS but many other  chronic and acute conditions are known to elevate hsTnI results.  Refer to the "Links" section for chest pain algorithms and additional  guidance. Performed at [facility], [place] [address] [place], [place] [address]   SARS CORONAVIRUS 2 (TAT 6-24 HRS) Nasopharyngeal Nasopharyngeal Swab     Status: None   Collection Time: 06/30/19  9:30 AM   Specimen: Nasopharyngeal Swab  Result Value Ref Range   SARS Coronavirus 2 NEGATIVE NEGATIVE    Comment: (NOTE) SARS-CoV-2 target nucleic acids are NOT DETECTED. The SARS-CoV-2 RNA is generally detectable  in upper and lower respiratory specimens during the acute phase of infection. Negative results do not preclude SARS-CoV-2 infection, do not rule out co-infections with other pathogens, and should not be used as the sole basis for treatment or other patient management decisions. Negative results must be combined with clinical observations, patient history, and epidemiological information. The expected result is Negative. Fact Sheet for Patients: SugarRoll.be Fact Sheet for Healthcare Providers: https://www.woods-mathews.com/ This test is not yet approved or cleared by the [place] FDA and  has been authorized for detection and/or diagnosis of SARS-CoV-2 by FDA under an Emergency Use Authorization (EUA). This EUA will remain  in effect (meaning  this test can be used) for the duration of the COVID-19 declaration under Section 56 4(b)(1) of the Act, 21 U.S.C. section 360bbb-3(b)(1), unless the authorization is terminated or revoked sooner. Performed at [facility], [place] [address], [place], [place] [address]   Glucose, capillary     Status: Abnormal   Collection Time: 06/30/19  5:06 PM  Result Value Ref Range   Glucose-Capillary 152 (H) 70 - 99 mg/dL  Lactic acid, plasma     Status: None   Collection Time: 06/30/19  7:52 PM  Result Value Ref Range   Lactic Acid, Venous 0.7 0.5 - 1.9 mmol/L    Comment: Performed at [facility] [address] [place], [place] [address]  Troponin I (High Sensitivity)     Status: None   Collection Time: 06/30/19  7:52 PM  Result Value Ref Range   Troponin I (High Sensitivity) 10 <18 ng/L    Comment: (NOTE) Elevated high sensitivity troponin I (hsTnI) values and significant  changes across serial measurements may suggest ACS but many other  chronic and acute conditions are known to elevate hsTnI results.  Refer to the "Links" section for chest pain algorithms and additional   guidance. Performed at [facility], [place] [address], [place], [place] [address]   Culture, blood (routine x 2)     Status: None (Preliminary result)   Collection Time: 06/30/19  7:52 PM   Specimen: BLOOD  Result Value Ref Range   Specimen Description BLOOD BLOOD RIGHT FOREARM    Special Requests      BOTTLES DRAWN AEROBIC ONLY Blood Culture results may not be optimal due to an inadequate volume of blood received in culture bottles   Culture      NO GROWTH < 24 HOURS Performed at [facility] [address] [place], [place] [address]    Report Status PENDING   Hepatic function panel     Status: Abnormal   Collection Time: 06/30/19  7:52 PM  Result Value Ref Range   Total Protein 8.0 6.5 - 8.1 g/dL   Albumin 2.9 (L) 3.5 - 5.0 g/dL   AST 17 15 - 41 U/L   ALT 12 0 - 44 U/L   Alkaline Phosphatase 44 38 - 126 U/L   Total Bilirubin 0.7 0.3 - 1.2 mg/dL   Bilirubin, Direct 0.3 (H) 0.0 - 0.2 mg/dL   Indirect Bilirubin 0.4 0.3 - 0.9 mg/dL    Comment: Performed at [facility] [address] [place], [place] [address]  Culture, blood (routine x 2)     Status: None (Preliminary result)   Collection Time: 06/30/19  8:00 PM   Specimen: BLOOD RIGHT HAND  Result Value Ref Range   Specimen Description BLOOD RIGHT HAND    Special Requests      BOTTLES DRAWN AEROBIC ONLY Blood Culture results may not be optimal due to an inadequate volume of blood received in culture bottles   Culture      NO GROWTH < 24 HOURS Performed at [facility] [address], [place], [place] [address]    Report Status PENDING   Troponin I (High Sensitivity)     Status: None   Collection Time: 06/30/19  8:55 PM  Result Value Ref Range   Troponin I (High Sensitivity) 12 <18 ng/L    Comment: (NOTE) Elevated high sensitivity troponin I (hsTnI) values and significant  changes across serial measurements may suggest ACS but  many other  chronic and acute conditions are known to elevate  hsTnI results.  Refer to the "Links" section for chest pain algorithms and additional  guidance. Performed at [facility], [place] [address] [place], [place] [address]   CBC     Status: Abnormal   Collection Time: 06/30/19  8:55 PM  Result Value Ref Range   WBC 5.8 4.0 - 10.5 K/uL   RBC 4.75 4.22 - 5.81 MIL/uL   Hemoglobin 13.1 13.0 - 17.0 g/dL   HCT 45.2 39.0 - 52.0 %   MCV 95.2 80.0 - 100.0 fL   MCH 27.6 26.0 - 34.0 pg   MCHC 29.0 (L) 30.0 - 36.0 g/dL   RDW 17.5 (H) 11.5 - 15.5 %   Platelets 193 150 - 400 K/uL   nRBC 0.3 (H) 0.0 - 0.2 %    Comment: Performed at [facility] [address], [place], [place] [address]  Glucose, capillary     Status: Abnormal   Collection Time: 06/30/19 10:22 PM  Result Value Ref Range   Glucose-Capillary 116 (H) 70 - 99 mg/dL  Lactic acid, plasma     Status: None   Collection Time: 06/30/19 10:23 PM  Result Value Ref Range   Lactic Acid, Venous 0.7 0.5 - 1.9 mmol/L    Comment: Performed at [facility] [address], [place], [place] [address]  Urinalysis, Routine w reflex microscopic     Status: Abnormal   Collection Time: 07/01/19 12:44 AM  Result Value Ref Range   Color, Urine AMBER (A) YELLOW    Comment: BIOCHEMICALS MAY BE AFFECTED BY COLOR   APPearance CLOUDY (A) CLEAR   Specific Gravity, Urine 1.015 1.005 - 1.030   pH 5.0 5.0 - 8.0   Glucose, UA NEGATIVE NEGATIVE mg/dL   Hgb urine dipstick NEGATIVE NEGATIVE   Bilirubin Urine NEGATIVE NEGATIVE   Ketones, ur NEGATIVE NEGATIVE mg/dL   Protein, ur >=300 (A) NEGATIVE mg/dL   Nitrite NEGATIVE NEGATIVE   Leukocytes,Ua MODERATE (A) NEGATIVE   RBC / HPF 0-5 0 - 5 RBC/hpf   WBC, UA 21-50 0 - 5 WBC/hpf   Bacteria, UA FEW (A) NONE SEEN   Squamous Epithelial / LPF 0-5 0 - 5   Mucus PRESENT     Comment: Performed at [facility], [address], [place], [place] [address]  CBC     Status: Abnormal   Collection Time: 07/01/19  2:23 AM  Result Value Ref Range    WBC 6.2 4.0 - 10.5 K/uL   RBC 4.66 4.22 - 5.81 MIL/uL   Hemoglobin 13.1 13.0 - 17.0 g/dL   HCT 44.2 39.0 - 52.0 %   MCV 94.8 80.0 - 100.0 fL   MCH 28.1 26.0 - 34.0 pg   MCHC 29.6 (L) 30.0 - 36.0 g/dL   RDW 17.7 (H) 11.5 - 15.5 %   Platelets 199 150 - 400 K/uL   nRBC 1.1 (H) 0.0 - 0.2 %    Comment: Performed at [facility], [place] [address] [place], [place] [address]  Renal function panel     Status: Abnormal   Collection Time: 07/01/19  2:23 AM  Result Value Ref Range   Sodium 141 135 - 145 mmol/L   Potassium 5.1 3.5 - 5.1 mmol/L   Chloride 103 98 - 111 mmol/L   CO2 26 22 - 32 mmol/L   Glucose, Bld 120 (H) 70 - 99 mg/dL   BUN  53 (H) 6 - 20 mg/dL   Creatinine, Ser 7.51 (H) 0.61 - 1.24 mg/dL   Calcium 8.1 (L) 8.9 - 10.3 mg/dL   Phosphorus 8.8 (H) 2.5 - 4.6 mg/dL   Albumin 2.8 (L) 3.5 - 5.0 g/dL   GFR calc non Af Amer 7 (L) >60 mL/min   GFR calc Af Amer 8 (L) >60 mL/min   Anion gap 12 5 - 15    Comment: Performed at [facility] [address], [place], [place] [address]  Glucose, capillary     Status: Abnormal   Collection Time: 07/01/19  6:19 AM  Result Value Ref Range   Glucose-Capillary 115 (H) 70 - 99 mg/dL  Blood gas, arterial     Status: Abnormal   Collection Time: 07/01/19  8:45 AM  Result Value Ref Range   FIO2 40.00    pH, Arterial 7.150 (LL) 7.350 - 7.450    Comment: CRITICAL RESULT CALLED TO, READ BACK BY AND VERIFIED WITH: LEWIS,B RN @ 0911 07/01/19 LEONARD,A    pCO2 arterial 83.2 (HH) 32.0 - 48.0 mmHg    Comment: CRITICAL RESULT CALLED TO, READ BACK BY AND VERIFIED WITH: LEWIS,B RN @ 6045 07/01/19 LEONARD,A    pO2, Arterial 76.9 (L) 83.0 - 108.0 mmHg   Bicarbonate 28.0 20.0 - 28.0 mmol/L   Acid-base deficit 0.2 0.0 - 2.0 mmol/L   O2 Saturation 93.3 %   Patient temperature 36.6    Collection site RIGHT RADIAL    Drawn by [id number]     Comment: COLLECTED BY RT   Sample type ARTERIAL    Allens test (pass/fail) PASS PASS    Comment: Performed at [facility], [place] [address], [place], [place] [address]  Glucose, capillary     Status: None   Collection Time: 07/01/19 11:31 AM  Result Value Ref Range   Glucose-Capillary 92 70 - 99 mg/dL    DG Chest Port 1 View  Result Date: 06/30/2019 CLINICAL DATA:  Worsening shortness of breath and chest tightness. EXAM: PORTABLE CHEST 1 VIEW COMPARISON:  04/08/2017 FINDINGS: Limited by body habitus and soft tissue attenuation. Cardiomegaly with a probable large left diaphragmatic herniation based on 2008 CT. Lung volumes are low and there is hazy density at both lung bases that is primarily attributed to soft tissue attenuation. No Kerley lines, visible effusion, or pneumothorax. IMPRESSION: 1. Cardiomega remote ly and vascular congestion. 2. 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 3. Limited study due to body habitus. Electronically Signed   By: Monte Fantasia M.D.   On: 06/30/2019 07:11   ECHOCARDIOGRAM COMPLETE  Result Date: 07/01/2019   ECHOCARDIOGRAM REPORT   Patient Name:   ATHA MCBAIN Slyter Date of Exam: 07/01/2019 Medical Rec #:  [id number]       Height:       72.0 in Accession #:    [id number]      Weight:       357.1 lb Date of Birth:  22-Dec-1960        BSA:          2.73 m Patient Age:    38 years        BP:           116/64 mmHg Patient Gender: M               HR:           87 bpm. Exam Location:  Inpatient Procedure: 2D Echo, Color Doppler, Cardiac Doppler  and Intracardiac            Opacification Agent STAT ECHO Indications:    V66.81 Acute diastolic (congestive) heart failure  History:        Patient has prior history of Echocardiogram examinations, most                 recent 04/16/2017. CHF, COPD; Risk Factors:Hypertension,                 Diabetes, Dyslipidemia and Sleep Apnea.  Sonographer:    [place] Referring Phys: [phone] [facility] VINCENT  Sonographer Comments: Image acquisition challenging due to patient body habitus. IMPRESSIONS  1. Left ventricular ejection fraction, by visual  estimation, is 60 to 65%. The left ventricle has normal function. There is no left ventricular hypertrophy.  2. Definity contrast agent was given IV to delineate the left ventricular endocardial borders.  3. The left ventricle has no regional wall motion abnormalities.  4. Abnormal septal motion.  5. Global right ventricle has normal systolic function.The right ventricular size is normal. No increase in right ventricular wall thickness.  6. Left atrial size was normal.  7. Right atrial size was mildly dilated.  8. The mitral valve is normal in structure. Trivial mitral valve regurgitation. No evidence of mitral stenosis.  9. The tricuspid valve is normal in structure. 10. The tricuspid valve is normal in structure. Tricuspid valve regurgitation is not demonstrated. 11. The aortic valve was not well visualized. Aortic valve regurgitation is not visualized. No evidence of aortic valve sclerosis or stenosis. 12. The pulmonic valve was not well visualized. Pulmonic valve regurgitation is not visualized. 13. The inferior vena cava is normal in size with greater than 50% respiratory variability, suggesting right atrial pressure of 3 mmHg. 14. The interatrial septum was not well visualized. FINDINGS  Left Ventricle: Left ventricular ejection fraction, by visual estimation, is 60 to 65%. The left ventricle has normal function. Definity contrast agent was given IV to delineate the left ventricular endocardial borders. The left ventricle has no regional wall motion abnormalities. There is no left ventricular hypertrophy. Left ventricular diastolic parameters were normal. Normal left atrial pressure. Abnormal septal motion. Right Ventricle: The right ventricular size is normal. No increase in right ventricular wall thickness. Global RV systolic function is has normal systolic function. Left Atrium: Left atrial size was normal in size. Right Atrium: Right atrial size was mildly dilated Pericardium: There is no evidence of  pericardial effusion. Mitral Valve: The mitral valve is normal in structure. There is mild thickening of the mitral valve leaflet(s). Trivial mitral valve regurgitation. No evidence of mitral valve stenosis by observation. Tricuspid Valve: The tricuspid valve is normal in structure. Tricuspid valve regurgitation is not demonstrated. Aortic Valve: The aortic valve was not well visualized. Aortic valve regurgitation is not visualized. The aortic valve is structurally normal, with no evidence of sclerosis or stenosis. Pulmonic Valve: The pulmonic valve was not well visualized. Pulmonic valve regurgitation is not visualized. Pulmonic regurgitation is not visualized. Aorta: The aortic root, ascending aorta and aortic arch are all structurally normal, with no evidence of dilitation or obstruction. Venous: The inferior vena cava is normal in size with greater than 50% respiratory variability, suggesting right atrial pressure of 3 mmHg. IAS/Shunts: The interatrial septum was not well visualized. There is no evidence of a patent foramen ovale. No ventricular septal defect is seen or detected. There is no evidence of an atrial septal defect.  LEFT VENTRICLE PLAX 2D LVOT  diam:     2.00 cm  Diastology LVOT Area:     3.14 cm LV e' lateral:   9.90 cm/s                         LV E/e' lateral: 9.3                         LV e' medial:    7.29 cm/s                         LV E/e' medial:  12.6  RIGHT VENTRICLE RV S prime:     13.90 cm/s TAPSE (M-mode): 1.8 cm LEFT ATRIUM           Index       RIGHT ATRIUM           Index LA diam:      3.70 cm 1.36 cm/m  RA Area:     35.40 cm LA Vol (A4C): 81.2 ml 29.79 ml/m RA Volume:   158.00 ml 57.96 ml/m  AORTIC VALVE LVOT Vmax:   125.00 cm/s LVOT Vmean:  88.800 cm/s LVOT VTI:    0.263 m  AORTA Ao Root diam: 3.10 cm Ao Asc diam:  3.10 cm MITRAL VALVE MV Area (PHT): 3.99 cm             SHUNTS MV PHT:        55.10 msec           Systemic VTI:  0.26 m MV Decel Time: 190 msec              Systemic Diam: 2.00 cm MV E velocity: 92.20 cm/s 103 cm/s MV A velocity: 86.80 cm/s 70.3 cm/s MV E/A ratio:  1.06       1.5  Jenkins Rouge MD Electronically signed by Jenkins Rouge MD Signature Date/Time: 07/01/2019/9:11:18 AM    Final     Assessment/Plan  1.  AKI on CKD V --> ESRD: volume overload.  Needs to start HD.  VVS to place catheter, will do first HD when out of OR, high-dose IV Lasix for that.  Will need CLIP, will speak to renal navigator.  2.  HTN: reasonable control, may be able to get off some antihypertensives when we get UF off  3.  Anemia: no ESA needed  4.  Bone/ mineral: check PTH  5.  CHF: expect to improve with UF and high dose Lasix  6.  Acute hypoxic and hypercarbic RF: on BiPaP for now  7.  Dispo: pending  Madelon Lips 07/01/2019, 11:52 AM

## 2019-07-01 NOTE — Transfer of Care (Signed)
Immediate Anesthesia Transfer of Care Note  Patient: Adam Dennis  Procedure(s) Performed: INSERTION OF TUNNELED DIALYSIS CATHETER (Right Arm Upper) Right arm arteriovenous fistual (Right Arm Upper)  Patient Location: PACU  Anesthesia Type:MAC  Level of Consciousness: drowsy  Airway & Oxygen Therapy: Patient Spontanous Breathing  Post-op Assessment: Report given to RN, Post -op Vital signs reviewed and stable, Patient moving all extremities and .  Post vital signs: Reviewed and stable  Last Vitals:  Vitals Value Taken Time  BP 122/102 07/01/19 1409  Temp 36.5 C 07/01/19 1354  Pulse 77 07/01/19 1410  Resp 24 07/01/19 1410  SpO2 97 % 07/01/19 1410  Vitals shown include unvalidated device data.  Last Pain:  Vitals:   07/01/19 1354  TempSrc:   PainSc: Asleep         Complications: No apparent anesthesia complications

## 2019-07-01 NOTE — Progress Notes (Addendum)
He was seen by Dr. Donnetta Hutching in clinic on 06/15/19 with a surgical plan for 07/08/19 at that time he was Stage IV CKD.    History of Present Illness:  Patient is a 59 y.o. year old male who is hospitalized for  progressive increasing shortness of breath for last 1 week to the point 4 last 2-3 days his breathing has gone worst with walking a few yards.    Past medical history includes: hypertensive heart disease with diastolic dysfunction, history of congestive heart failure secondary to preserved LV systolic function, diabetes mellitus, morbid obesity, obstructive sleep apnea,/obesity hypoventilation syndrome on CPAP at home, chronic kidney disease stage IV, COPD, and hyperlipidemia.  He has now progressed to ESRD with Cr of 7.5 with a GFR of 8.   We have been asked to place a [facility] and permanent access.  He has not eaten today.    Past Medical History:  Diagnosis Date  . Arthritis    "back" (02/06/2017)  . CHF (congestive heart failure) ([place])    new onset /Encounter Date 09/12/2006  . Chronic combined systolic and diastolic heart failure ([place])   . CKD (chronic kidney disease) stage 4, GFR 15-29 ml/min (H[facility])   . COPD (chronic obstructive pulmonary disease) ([place])   . Glaucoma, right eye   . High cholesterol   . Hypertension   . Hypertrophy of tonsils alone   . Myocardial infarction [place])    "small one; ~ 2008" (02/06/2017)  . Obesity, unspecified   . On home oxygen therapy    "4L; 24/7" (04/10/2017)  . OSA on CPAP   . Type II diabetes mellitus ([place])     Past Surgical History:  Procedure Laterality Date  . HERNIA REPAIR    . TEE WITHOUT CARDIOVERSION N/A 04/16/2017   Procedure: TRANSESOPHAGEAL ECHOCARDIOGRAM (TEE);  Surgeon: [name], MD;  Location: [facility] ENDOSCOPY;  Service: Cardiovascular;  Laterality: N/A;  . UMBILICAL HERNIA REPAIR  1960s   "when I was little baby"     Social History Social History   Tobacco Use  . Smoking status: Never Smoker  . Smokeless tobacco:  Never Used  Substance Use Topics  . Alcohol use: No  . Drug use: No    Family History Family History  Problem Relation Age of Onset  . Hypertension Mother   . Diabetes Brother   . Diabetes Sister   . Cancer Maternal Aunt   . Amblyopia Neg Hx   . Blindness Neg Hx   . Cataracts Neg Hx   . Glaucoma Neg Hx   . Macular degeneration Neg Hx   . Strabismus Neg Hx   . Retinal detachment Neg Hx   . Retinitis pigmentosa Neg Hx     Allergies  No Known Allergies   Current Facility-Administered Medications  Medication Dose Route Frequency Provider Last Rate Last Admin  . acetaminophen (TYLENOL) tablet 650 mg  650 mg Oral Q6H PRN Ina Homes, MD   650 mg at 07/01/19 0944   Or  . acetaminophen (TYLENOL) suppository 650 mg  650 mg Rectal Q6H PRN Ina Homes, MD      . albuterol (PROVENTIL) (2.5 MG/3ML) 0.083% nebulizer solution 2.5 mg  2.5 mg Nebulization Q6H PRN Axel Filler, MD      . aspirin EC tablet 81 mg  81 mg Oral Daily Ina Homes, MD   81 mg at 07/01/19 0919  . atorvastatin (LIPITOR) tablet 40 mg  40 mg Oral q1800 Ina Homes, MD      .  calcitRIOL (ROCALTROL) capsule 0.25 mcg  0.25 mcg Oral Daily Ina Homes, MD   0.25 mcg at 07/01/19 0919  . calcium carbonate (TUMS - dosed in mg elemental calcium) chewable tablet 200 mg of elemental calcium  1 tablet Oral BID Ina Homes, MD   200 mg of elemental calcium at 07/01/19 0919  . [START ON 07/02/2019] cefUROXime (ZINACEF) 1.5 g in sodium chloride 0.9 % 100 mL IVPB  1.5 g Intravenous On Call to OR Ulyses Amor, PA-C      . Chlorhexidine Gluconate Cloth 2 % PADS 6 each  6 each Topical Daily Axel Filler, MD   6 each at 07/01/19 [phone]  . cholecalciferol (VITAMIN D3) tablet 2,000 Units  2,000 Units Oral Daily Ina Homes, MD   2,000 Units at 07/01/19 0919  . enoxaparin (LOVENOX) injection 30 mg  30 mg Subcutaneous Q24H Helberg, Justin, MD   30 mg at 06/30/19 1341  . [START ON 07/02/2019] ferrous  sulfate tablet 325 mg  325 mg Oral Q breakfast Helberg, Justin, MD      . insulin aspart (novoLOG) injection 0-5 Units  0-5 Units Subcutaneous QHS Helberg, Justin, MD      . insulin aspart (novoLOG) injection 0-9 Units  0-9 Units Subcutaneous TID WC Ina Homes, MD   2 Units at 06/30/19 1745  . latanoprost (XALATAN) 0.005 % ophthalmic solution 1 drop  1 drop Both Eyes QHS Ina Homes, MD   1 drop at 06/30/19 2231  . loratadine (CLARITIN) tablet 10 mg  10 mg Oral Daily Ina Homes, MD   10 mg at 07/01/19 0919  . mometasone-formoterol (DULERA) 200-5 MCG/ACT inhaler 2 puff  2 puff Inhalation BID Ina Homes, MD   2 puff at 07/01/19 0939  . ondansetron (ZOFRAN) tablet 4 mg  4 mg Oral Q6H PRN Ina Homes, MD       Or  . ondansetron (ZOFRAN) injection 4 mg  4 mg Intravenous Q6H PRN Helberg, Justin, MD      . perflutren lipid microspheres (DEFINITY) IV suspension  1-10 mL Intravenous PRN Axel Filler, MD   3.5 mL at 07/01/19 0859  . sertraline (ZOLOFT) tablet 25 mg  25 mg Oral Daily Ina Homes, MD   25 mg at 07/01/19 0919  . timolol (TIMOPTIC) 0.5 % ophthalmic solution 1 drop  1 drop Both Eyes Daily Helberg, Larkin Ina, MD   1 drop at 07/01/19 0944    ROS:   General:  No weight loss, Fever, chills  HEENT: No recent headaches, no nasal bleeding, no visual changes, no sore throat  Neurologic: No dizziness, blackouts, seizures. No recent symptoms of stroke or mini- stroke. No recent episodes of slurred speech, or temporary blindness.  Cardiac: No recent episodes of chest pain/pressure, no shortness of breath at rest.  No shortness of breath with exertion.  Denies history of atrial fibrillation or irregular heartbeat  Vascular: No history of rest pain in feet.  No history of claudication.  No history of non-healing ulcer, No history of DVT   Pulmonary: No home oxygen, no productive cough, no hemoptysis,  No asthma or wheezing labored breathing now on  CPAP  Musculoskeletal:  [ ]  Arthritis, [ ]  Low back pain,  [ ]  Joint pain  Hematologic:No history of hypercoagulable state.  No history of easy bleeding.  No history of anemia  Gastrointestinal: No hematochezia or melena,  No gastroesophageal reflux, no trouble swallowing  Urinary: [x ] chronic Kidney disease, [ ]  on HD - [ ]   MWF or [ ]  TTHS, [ ]  Burning with urination, [ ]  Frequent urination, [ ]  Difficulty urinating;   Skin: No rashes  Psychological: No history of anxiety,  No history of depression   Physical Examination  Vitals:   07/01/19 0619 07/01/19 0939 07/01/19 0940 07/01/19 0949  BP:    111/64  Pulse: 93   84  Resp: (!) 21   (!) 27  Temp:      TempSrc:      SpO2: 96% 96% 96% 97%  Weight:        Body mass index is 48.44 kg/m.  General:  Alert and oriented, no acute distress can carry on a conversation. HEENT: Normal Neck: No bruit or JVD Pulmonary: bibasilar crackles, BiPAP Cardiac: Regular Rate and Rhythm without murmur Gastrointestinal: Soft, non-tender, non-distended, no mass, no scars Skin: No rash Extremity Pulses:  2+ radial, brachial pulses bilaterally Musculoskeletal: B pitting  LE edema  Neurologic: Upper and lower extremity grossly motor and symmetric  DATA:  +-----------------+-------------+----------+---------+  Right Cephalic  Diameter (cm)Depth (cm)Findings   +-----------------+-------------+----------+---------+  Shoulder       0.47               +-----------------+-------------+----------+---------+  Prox upper arm    0.51               +-----------------+-------------+----------+---------+  Mid upper arm    0.55               +-----------------+-------------+----------+---------+  Dist upper arm    0.41               +-----------------+-------------+----------+---------+  Antecubital fossa  0.64                +-----------------+-------------+----------+---------+  Prox forearm    0.34 / 0.41      branching  +-----------------+-------------+----------+---------+  Mid forearm     0.30               +-----------------+-------------+----------+---------+  Dist forearm     0.22               +-----------------+-------------+----------+---------+   +-----------------+-------------+----------+--------------+  Right Basilic  Diameter (cm)Depth (cm)  Findings    +-----------------+-------------+----------+--------------+  Prox upper arm              not visualized  +-----------------+-------------+----------+--------------+  Mid upper arm    0.47                 +-----------------+-------------+----------+--------------+  Dist upper arm    0.49                 +-----------------+-------------+----------+--------------+  Antecubital fossa 0.50 / 0.45               +-----------------+-------------+----------+--------------+  Prox forearm     0.29         branching    +-----------------+-------------+----------+--------------+   +-----------------+-------------+----------+---------+  Left Cephalic  Diameter (cm)Depth (cm)Findings   +-----------------+-------------+----------+---------+  Shoulder       0.57               +-----------------+-------------+----------+---------+  Prox upper arm    0.57        branching  +-----------------+-------------+----------+---------+  Mid upper arm    0.50               +-----------------+-------------+----------+---------+  Dist upper arm   0.53 / 0.49      branching  +-----------------+-------------+----------+---------+  Antecubital fossa 0.55 / 0.58      branching  +-----------------+-------------+----------+---------+  Prox forearm     0.46        branching  +-----------------+-------------+----------+---------+  Mid forearm     0.29               +-----------------+-------------+----------+---------+  Dist forearm     0.24               +-----------------+-------------+----------+---------+   +-----------------+------------------+----------+--------------+  Left Basilic    Diameter (cm)  Depth (cm)  Findings    +-----------------+------------------+----------+--------------+  Prox upper arm                not visualized  +-----------------+------------------+----------+--------------+  Mid upper arm    0.60 / 0.41        branching    +-----------------+------------------+----------+--------------+  Dist upper arm  0.52 / 0.37 / 0.49      branching    +-----------------+------------------+----------+--------------+  Antecubital fossa  0.24 / 0.37                +-----------------+------------------+----------+--------------+  Prox forearm       0.32                  +-----------------+------------------+----------+--------------+    ASSESSMENT:  ESRD   PLAN: ESRD He has a very acceptable right cephalic vein on vein mapping Hypotensive, hypothermia.  Rapid response was called 06/30/19  was started  RRT started Bipap.  He is stable now.    NPO Consent for right UE AV fistula verses graft Patient agrees to proceed to surgery today.  Roxy Horseman PA-C [facility] [place] Office: [phone]  I have independently interviewed and examined patient and agree with PA assessment and plan above. Plan for tdc today and he will need hd. Will also place avf vs avg while in OR as long as he tolerates from a pulmonary standpoint.   Jaquis Picklesimer C. Donzetta Matters, MD [facility] [place] Office:  [phone] Pager: [phone]

## 2019-07-01 NOTE — Progress Notes (Signed)
Spoke with and updated pt girlfriend Abigail Butts per pt request. Informed girlfriend that pt is in [place] getting fistula placed. All questions answered.   Arletta Bale, RN

## 2019-07-01 NOTE — Progress Notes (Signed)
Pt received from hemodialysis. RRT at bedside assisting with bipap. VSS. Pt denies needs at this time.  Arletta Bale, RN

## 2019-07-01 NOTE — Progress Notes (Signed)
RT NOTES: Transported patient on bipap from PACU to room 7N42 without complications.

## 2019-07-01 NOTE — Progress Notes (Signed)
RT NOTES: ABG obtained and sent to lab. Lab notified.

## 2019-07-01 NOTE — Op Note (Signed)
Patient name: Adam Dennis MRN: [id number] DOB: March 06, 1961 Sex: male  07/01/2019 Pre-operative Diagnosis: esrd Post-operative diagnosis:  Same Surgeon:  Erlene Quan C. Donzetta Matters, MD Assistant: Karoline Caldwell, PA Procedure Performed: 1.  Right IJ 19 cm tunneled dialysis catheter placement with ultrasound guidance 2.  Right arm brachial artery to cephalic vein AV fistula creation  Indications: 59 year old male was scheduled for permanent dialysis access electively.  He has now been admitted with volume overload and is in need of tunneled catheter as well as fistula versus graft.  Findings: Right IJ was large when cannulation he had significant venous flow through the needle consistent with volume overload.  19 cm catheter was placed to the SVC atrial junction.  The cephalic vein at the wrist was quite diminutive and sclerotic.  There was some evidence of previous IV cannulation at the antecubitum.  This was with both ultrasound and open exploration.  It was easily dilated and a fistula was created with strong thrill.  Patient palpable radial artery pulse the wrist.   Procedure:  The patient was identified in the holding area and taken to the operating where is placed supine operative table.  MAC anesthesia was induced.  Patient was on BiPAP already in preoperative holding due to volume overload.  He was sterilely prepped and draped in his right neck and chest as well as right upper extremity in the usual fashion.  Antibiotics were administered and a timeout was called.  Ultrasound was used to identify both the IJ this area was anesthetized as well as the cephalic vein with the above findings.  The antecubital was also anesthetized these were done with 1% lidocaine.  I then cannulated the IJ with direct ultrasound visualization with 18-gauge needle and a wire was passed centrally.  A counterincision was made and a 19 cm catheter was tunneled and assembled.  The wire tract was serially dilated the catheter was  placed to the SVC atrial junction.  Was flushed heparinized saline affixed to the skin with 3-0 nylon suture.  The neck incision was closed with 4 Monocryl and Dermabond was placed to both sides.  Catheter was then locked with 1.5 cc of concentrated heparin either port.  We turned our attention to the right upper extremity.  We made a transverse incision between the palpable brachial pulse below the antecubitum and the previously marked cephalic vein.  We dissected out the vein marked for orientation.  It was clear that this has been cannulated for recent IV placement.  I then dissected through the deep fascia identified the brachial artery and placed a vessel loop around this.  The vein was clipped distally and transected.  I spatulated the end.  I flushed with heparinized saline dilating up to 3 mm and clamped it.  I then clamped the artery distally and proximally opened longitudinally flushed with heparinized saline both directions.  The vein was then sewn inside with 6-0 Prolene suture.  Prior to completion anastomosis without flushing all directions.  Upon completion there was a good thrill in the vein confirmed with Doppler we are able to trace this up to the biceps.  He also had a radial artery pulse confirmed with Doppler at the wrist.  Satisfied with this we irrigated wound closed in layers with Vicryl and Monocryl.  Dermabond placed to the level of skin.  He is awake from anesthesia having tolerated procedure without immediate complication.  All counts were correct at completion.  EBL: 50 cc   Deyonna Fitzsimmons C. Donzetta Matters,  MD [facility] [place] Office: [phone] Pager: [phone]

## 2019-07-01 NOTE — Progress Notes (Signed)
Subjective:  Events of last night noted.  Patient denies any chest pain or shortness of breath remains on BiPAP.  No further episodes of hypotension.  States has passed small amount of urine.  Objective:  Vital Signs in the last 24 hours: Temp:  [92.2 F (33.4 C)-98.2 F (36.8 C)] 97.9 F (36.6 C) (02/04 0541) Pulse Rate:  [58-93] 93 (02/04 0619) Resp:  [15-39] 21 (02/04 0619) BP: (83-145)/(43-92) 123/79 (02/04 0600) SpO2:  [86 %-100 %] 96 % (02/04 0619) Weight:  [162 kg] 162 kg (02/04 0541)  Intake/Output from previous day: 02/03 0701 - 02/04 0700 In: 503 [P.O.:240; IV Piggyback:112] Out: 0  Intake/Output from this shift: Total I/O In: -  Out: 15 [Urine:15]  Physical Exam: Neck: no adenopathy, no carotid bruit, no JVD and supple, symmetrical, trachea midline Lungs: Decreased breath sound at bases clear anterolaterally air entry has improved Heart: regular rate and rhythm, S1, S2 normal and Soft systolic murmur noted no pericardial rub Abdomen: soft, non-tender; bowel sounds normal; no masses,  no organomegaly Extremities: extremities normal, atraumatic, no cyanosis or edema  Lab Results: Recent Labs    06/30/19 2055 07/01/19 0223  WBC 5.8 6.2  HGB 13.1 13.1  PLT 193 199   Recent Labs    06/30/19 0624 07/01/19 0223  NA 139 141  K 3.9 5.1  CL 100 103  CO2 24 26  GLUCOSE 106* 120*  BUN 44* 53*  CREATININE 6.80* 7.51*   No results for input(s): TROPONINI in the last 72 hours.  Invalid input(s): CK, MB Hepatic Function Panel Recent Labs    06/30/19 1952 06/30/19 1952 07/01/19 0223  PROT 8.0  --   --   ALBUMIN 2.9*   < > 2.8*  AST 17  --   --   ALT 12  --   --   ALKPHOS 44  --   --   BILITOT 0.7  --   --   BILIDIR 0.3*  --   --   IBILI 0.4  --   --    < > = values in this interval not displayed.   No results for input(s): CHOL in the last 72 hours. No results for input(s): PROTIME in the last 72 hours.  Imaging: Imaging results have been reviewed  and DG Chest Port 1 View  Result Date: 06/30/2019 CLINICAL DATA:  Worsening shortness of breath and chest tightness. EXAM: PORTABLE CHEST 1 VIEW COMPARISON:  04/08/2017 FINDINGS: Limited by body habitus and soft tissue attenuation. Cardiomegaly with a probable large left diaphragmatic herniation based on 2008 CT. Lung volumes are low and there is hazy density at both lung bases that is primarily attributed to soft tissue attenuation. No Kerley lines, visible effusion, or pneumothorax. IMPRESSION: 1. Cardiomega remote ly and vascular congestion. 2. 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 3. Limited study due to body habitus. Electronically Signed   By: Monte Fantasia M.D.   On: 06/30/2019 07:11    Cardiac Studies:  Assessment/Plan:  Decompensated congestive heart failure secondary to preserved LV systolic function .  Secondary to increased fluid intake/worsening renal function Status post hypotensive shock probably secondary to medications Hypertension. Diabetes mellitus. Acute on chronic kidney disease stage IV. Probably component of cardiorenal syndrome COPD Morbid obesity. Obstructive sleep apnea/obesity hypoventilation syndrome on CPAP. Plan Continue present management  Hold BP meds Consider renal service consult Wean off CPAP as tolerated Check 2D echo  LOS: 1 day    Charolette Forward 07/01/2019, 9:06 AM

## 2019-07-01 NOTE — Progress Notes (Signed)
RT NOTES: Transported patient on bipap from room 4E11 to dialysis without complications.

## 2019-07-01 NOTE — Anesthesia Postprocedure Evaluation (Signed)
Anesthesia Post Note  Patient: Adam Dennis  Procedure(s) Performed: INSERTION OF TUNNELED DIALYSIS CATHETER (Right Arm Upper) Right arm arteriovenous fistual (Right Arm Upper)     Patient location during evaluation: PACU Anesthesia Type: MAC Level of consciousness: awake and alert Pain management: pain level controlled Vital Signs Assessment: post-procedure vital signs reviewed and stable Respiratory status: spontaneous breathing, nonlabored ventilation, respiratory function stable and patient connected to nasal cannula oxygen Cardiovascular status: stable and blood pressure returned to baseline Postop Assessment: no apparent nausea or vomiting Anesthetic complications: no    Last Vitals:  Vitals:   07/01/19 1409 07/01/19 1439  BP:  (!) 157/134  Pulse:  76  Resp:  (!) 22  Temp:  36.4 C  SpO2: 96% 97%    Last Pain:  Vitals:   07/01/19 1439  TempSrc: Axillary  PainSc:                  Bethanny Toelle DAVID

## 2019-07-01 NOTE — Progress Notes (Signed)
Pt received from PACU. HD cath in place, dressing clean and intact. New fistula in right upper arm with old drainage.  Respiratory therapist at bedside with BIPAP. VSS. Pt denies needs at this time.   Arletta Bale, RN

## 2019-07-01 NOTE — Anesthesia Preprocedure Evaluation (Addendum)
Anesthesia Evaluation  Patient identified by MRN, date of birth, ID band Patient awake    Reviewed: Allergy & Precautions, NPO status , Patient's Chart, lab work & pertinent test results  Airway Mallampati: II  TM Distance: >3 FB Neck ROM: Full    Dental   Pulmonary sleep apnea ,    Pulmonary exam normal        Cardiovascular METS: hypertension, Pt. on medications +CHF  Normal cardiovascular exam     Neuro/Psych Depression    GI/Hepatic   Endo/Other  diabetes, Type 2Morbid obesity  Renal/GU ESRFRenal disease     Musculoskeletal   Abdominal   Peds  Hematology   Anesthesia Other Findings   Reproductive/Obstetrics                            Anesthesia Physical Anesthesia Plan  ASA: III  Anesthesia Plan: MAC   Post-op Pain Management:    Induction: Intravenous  PONV Risk Score and Plan: 1 and Midazolam and Propofol infusion  Airway Management Planned: Oral ETT  Additional Equipment:   Intra-op Plan:   Post-operative Plan:   Informed Consent: I have reviewed the patients History and Physical, chart, labs and discussed the procedure including the risks, benefits and alternatives for the proposed anesthesia with the patient or authorized representative who has indicated his/her understanding and acceptance.       Plan Discussed with: CRNA and Surgeon  Anesthesia Plan Comments:        Anesthesia Quick Evaluation

## 2019-07-01 NOTE — Progress Notes (Addendum)
Echocardiogram 2D Echocardiogram has been performed.  Oneal Deputy Rameen Quinney 07/01/2019, 9:07 AM   Dr. Johnsie Cancel notified of stat

## 2019-07-01 NOTE — Progress Notes (Signed)
Spoke with and updated pt sister Olivia Mackie on plan of care . Informed sister that pt is in surgery getting AV fistula placed. All questions answered.   Arletta Bale, RN

## 2019-07-01 NOTE — Progress Notes (Signed)
RT NOTES: Transported patient from dialysis to room 4E11 on bipap without complications.

## 2019-07-01 NOTE — Progress Notes (Signed)
Subjective: HD#1 Events Overnight: Patient developed a low temperature of 92.2 rectally and placed on bear hugger. Blood pressure of 87/51, HR 60's, SpO2 of 88% on 6L, Supplemental O2 increased to 7L. He has had minimal to no output recorded overnight and his most recent weight was last recorded on 159.7 kg. Bladder scan was performed showing minimal to no volume and foley cath inserted with minimal output.   PCCM and Cardiology were pages and levophed drip order. Upon re-evaluation the patient's blood pressure improved to 126/57 with a MAP of 78. Troponin's were trended and stable at 10 and 12. EKG unchanged from prior. Patients blood pressure medications were held per Dr. Terrence Dupont. Infectious work up initiated with lactic acid, blood cultures and urine cultures. Echocardiogram ordered.  Extra dose of Bumex 5 mg given overnight, still minimal urine output this morning.  Patient was seen this morning on rounds. He denies cough, chest pain, dysuria, non-healing wounds.   Objective:  Vital signs in last 24 hours: Vitals:   07/01/19 0225 07/01/19 0231 07/01/19 0300 07/01/19 0541  BP:   (!) 120/92 133/84  Pulse:   88 80  Resp:   (!) 39 (!) 35  Temp: 97.8 F (36.6 C) (!) 97.4 F (36.3 C)  97.9 F (36.6 C)  TempSrc: Axillary Rectal  Oral  SpO2:   96% 97%  Weight:    (!) 162 kg  Patient placed on bipap  Physical Exam: Physical Exam  Constitutional: He is oriented to person, place, and time. He has a sickly appearance. No distress.  Shivering  HENT:  Head: Normocephalic and atraumatic.  Eyes: EOM are normal.  Cardiovascular: Normal rate, regular rhythm, normal heart sounds and intact distal pulses. Exam reveals no gallop and no friction rub.  No murmur heard. Pulmonary/Chest: Accessory muscle usage present. Tachypnea noted. He is in respiratory distress. He has decreased breath sounds.  Musculoskeletal:        General: Edema present. Normal range of motion.     Cervical back: Normal  range of motion.  Neurological: He is alert and oriented to person, place, and time.  Skin: Skin is warm and dry.    Filed Weights   07/01/19 0541  Weight: (!) 162 kg  Previous weight in 08/2018: 155.6 Kg   Intake/Output Summary (Last 24 hours) at 07/01/2019 0641 Last data filed at 07/01/2019 0543 Gross per 24 hour  Intake 493 ml  Output 0 ml  Net 493 ml  Bladder scan: did not detect urine    Pertinent labs/Imaging: CBC Latest Ref Rng & Units 07/01/2019 06/30/2019 06/30/2019  WBC 4.0 - 10.5 K/uL 6.2 5.8 5.9  Hemoglobin 13.0 - 17.0 g/dL 13.1 13.1 13.5  Hematocrit 39.0 - 52.0 % 44.2 45.2 44.5  Platelets 150 - 400 K/uL 199 193 184   Lactic acid: 0.7 Hgb A1C: 6.2  Troponin: 10, 12   ABG: ABG    Component Value Date/Time   PHART 7.150 (LL) 07/01/2019 0845   PCO2ART 83.2 (HH) 07/01/2019 0845   PO2ART 76.9 (L) 07/01/2019 0845   HCO3 28.0 07/01/2019 0845   TCO2 42 (H) 04/09/2017 1609   ACIDBASEDEF 0.2 07/01/2019 0845   O2SAT 93.3 07/01/2019 0845   CMP Latest Ref Rng & Units 07/01/2019 06/30/2019 03/24/2019  Glucose 70 - 99 mg/dL 120(H) 106(H) 116(H)  BUN 6 - 20 mg/dL 53(H) 44(H) 36(H)  Creatinine 0.61 - 1.24 mg/dL 7.51(H) 6.80(H) 4.40(H)  Sodium 135 - 145 mmol/L 141 139 141  Potassium 3.5 - 5.1 mmol/L  5.1 3.9 4.7  Chloride 98 - 111 mmol/L 103 100 103  CO2 22 - 32 mmol/L 26 24 28   Calcium 8.9 - 10.3 mg/dL 8.1(L) 8.1(L) 8.8  Total Protein 6.5 - 8.1 g/dL - 8.0 6.9  Total Bilirubin 0.3 - 1.2 mg/dL - 0.7 0.5  Alkaline Phos 38 - 126 U/L - 44 48  AST 15 - 41 U/L - 17 12  ALT 0 - 44 U/L - 12 10   CXR: 1. Cardiomega remote ly and vascular congestion. 2. 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 3. Limited study due to body habitus.  UA:  Urine dipstick shows positive for WBC's.  Micro exam: 21-50 WBC's per HPF, 0-5 RBC's per HPF, 300+ protein, Few bacteria and mucus seen.   Assessment/Plan:  Principal Problem:   Acute respiratory failure with hypoxia (HCC) Active Problems:    Morbid obesity (HCC)   Chronic combined systolic and diastolic CHF (congestive heart failure) (HCC)   CKD (chronic kidney disease) stage 4, GFR 15-29 ml/min (HCC)   AKI (acute kidney injury) ([place])   Heart failure ([place])   Patient Summary: Adam Dennis is a 59 y.o. with pertinent PMH of HFrEF (LVEF now recovered), HTN, OSA, HLD, and CKD stage V, who presented to the ED with DOE, admit for HFrEF exacerbation. Now on hospital day 1  #Acute Hypercarbic/Hypoxic Respiratory Failure  #Obesity Hypoventilation Syndrome #Acute Respiratory Acidosis #Cardiorenal Syndrome  #AKI on CKD Stage V: Patient's respiratory failure worsened overnight with patient becoming progressively tachypnic, hypoxemic, and hypercarbic in the setting of worsening kidney function and anuria. Patient has a history of HFrEF that has since resolved. Most recent echo shows an EF of 60-65%, normal LV function without hypertrophy or region wall motion abnormalities. Patient respiratory failure is likely multifactorial in the setting cardiorenal syndrome and obesity hypoventilation syndrome. Recent ABG showed hypercardia with acute respiratory acidosis. Patient was started on BiPAP.  - ABG this morning showed respiratory acidosis.  BiPAP was ordered. We will repeat ABG. - Patient was given 120 Lasix and additional 5 mg Bumex without urine output or improvement of his respiratory symptoms.   - Dr. Hollie Salk form nephrology was consulted and she plans to continue furosemide 160 mg for now and dialysis when vascular access is obtained. - Continue BiPAP for respiratory acidosis  #Hypothermia: Patient had an episode of hypothermia and hypotension overnight that resolved without intervention.  Patient does not have a leukocytosis or lactic acidosis.  He is currently afebrile and not hypothermic, normotensive.  ABG did show a respiratory acidosis with a possible secondary metabolic acidosis.  Infectious work-up initiated overnight noting blood  cultures, urinalysis, urine cultures.  No empiric antibiotics were started. - Blood cultures pending - Urine culture pending  - Urinalysis significant for bacteruria, without dysuria   #Obesity Hypoventilation Syndrome: Patient's PFTs show significant restrictive disease likely secondary to obesity hypoventilation syndrome.  He uses a CPAP nightly including last night.  He states that this has helped his breathing some.   - Continue BiPAP    #HTN:  - Discontinued HTN medication overnight in the setting of hypotension.  #DM: - A1C of 6.2 - SSI  Diet: NPO for surgery today IVF: none VTE: Heparin Code: Full  Dispo: Anticipated discharge pending clinical improvement.Marland Kitchen   Marianna Payment, D.O. [facility], PGY-1 Date 07/01/2019 Time 6:41 AM Pager: [phone]

## 2019-07-01 NOTE — Progress Notes (Signed)
D.C. bear hugger per M.D.orders Dr. Ronnald Ramp

## 2019-07-01 NOTE — Progress Notes (Addendum)
CRITICAL VALUE ALERT  Critical Value:  PCO2 83.2  Date & Time Notied:  06/30/2019 0910  Provider Notified: Dr. Marianna Payment  Orders Received/Actions taken: Verbal order to start pt on BIPAP. Will notify respiratory therapy.

## 2019-07-02 DIAGNOSIS — I5033 Acute on chronic diastolic (congestive) heart failure: Secondary | ICD-10-CM

## 2019-07-02 DIAGNOSIS — N186 End stage renal disease: Secondary | ICD-10-CM

## 2019-07-02 DIAGNOSIS — J9611 Chronic respiratory failure with hypoxia: Secondary | ICD-10-CM | POA: Diagnosis not present

## 2019-07-02 LAB — RENAL FUNCTION PANEL
Albumin: 2.6 g/dL — ABNORMAL LOW (ref 3.5–5.0)
Anion gap: 16 — ABNORMAL HIGH (ref 5–15)
BUN: 45 mg/dL — ABNORMAL HIGH (ref 6–20)
CO2: 25 mmol/L (ref 22–32)
Calcium: 7.7 mg/dL — ABNORMAL LOW (ref 8.9–10.3)
Chloride: 99 mmol/L (ref 98–111)
Creatinine, Ser: 6.8 mg/dL — ABNORMAL HIGH (ref 0.61–1.24)
GFR calc Af Amer: 9 mL/min — ABNORMAL LOW (ref >60)
GFR calc non Af Amer: 8 mL/min — ABNORMAL LOW (ref >60)
Glucose, Bld: 83 mg/dL (ref 70–99)
Phosphorus: 6.9 mg/dL — ABNORMAL HIGH (ref 2.5–4.6)
Potassium: 4.8 mmol/L (ref 3.5–5.1)
Sodium: 140 mmol/L (ref 135–145)

## 2019-07-02 LAB — BLOOD GAS, ARTERIAL
Acid-Base Excess: 1.8 mmol/L (ref 0.0–2.0)
Acid-Base Excess: 3.2 mmol/L — ABNORMAL HIGH (ref 0.0–2.0)
Bicarbonate: 28.5 mmol/L — ABNORMAL HIGH (ref 20.0–28.0)
Bicarbonate: 29.3 mmol/L — ABNORMAL HIGH (ref 20.0–28.0)
Drawn by: 511471
FIO2: 60
FIO2: 50
O2 Saturation: 96.6 %
O2 Saturation: 97.2 %
Patient temperature: 37.1
Patient temperature: 37.1
pCO2 arterial: 69.5 mmHg (ref 32.0–48.0)
pCO2 arterial: 63.5 mmHg — ABNORMAL HIGH (ref 32.0–48.0)
pH, Arterial: 7.238 — ABNORMAL LOW (ref 7.350–7.450)
pH, Arterial: 7.286 — ABNORMAL LOW (ref 7.350–7.450)
pO2, Arterial: 97.6 mmHg (ref 83.0–108.0)
pO2, Arterial: 102 mmHg (ref 83.0–108.0)

## 2019-07-02 LAB — CBC
HCT: 40.7 % (ref 39.0–52.0)
Hemoglobin: 12.3 g/dL — ABNORMAL LOW (ref 13.0–17.0)
MCH: 28.1 pg (ref 26.0–34.0)
MCHC: 30.2 g/dL (ref 30.0–36.0)
MCV: 93.1 fL (ref 80.0–100.0)
Platelets: 162 10*3/uL (ref 150–400)
RBC: 4.37 MIL/uL (ref 4.22–5.81)
RDW: 17 % — ABNORMAL HIGH (ref 11.5–15.5)
WBC: 7.7 10*3/uL (ref 4.0–10.5)
nRBC: 0.8 % — ABNORMAL HIGH (ref 0.0–0.2)

## 2019-07-02 LAB — GLUCOSE, CAPILLARY
Glucose-Capillary: 82 mg/dL (ref 70–99)
Glucose-Capillary: 88 mg/dL (ref 70–99)
Glucose-Capillary: 75 mg/dL (ref 70–99)
Glucose-Capillary: 78 mg/dL (ref 70–99)

## 2019-07-02 LAB — HEPATITIS B E ANTIGEN: Hep B E Ag: NEGATIVE

## 2019-07-02 LAB — HEPATITIS B E ANTIBODY: Hep B E Ab: NEGATIVE

## 2019-07-02 MED ORDER — HEPARIN SODIUM (PORCINE) 1000 UNIT/ML IJ SOLN
INTRAMUSCULAR | Status: AC
  Administered 2019-07-02: 3000 [IU] via INTRAVENOUS_CENTRAL
  Filled 2019-07-02: qty 3

## 2019-07-02 MED ORDER — CHLORHEXIDINE GLUCONATE CLOTH 2 % EX PADS
6.0000 | MEDICATED_PAD | Freq: Every day | CUTANEOUS | Status: DC
  Administered 2019-07-02 – 2019-07-07 (×6): 6 via TOPICAL

## 2019-07-02 NOTE — Progress Notes (Signed)
[facility] NEPHROLOGY PROGRESS NOTE  Assessment/ Plan: Pt is a 59 y.o. yo male with CHF, OSA, obesity, COPD, CKD 5 admitted with respiratory failure, fluid overload and progressive renal failure.  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  Started HD on 07/02/2019 after placement of right IJ TDC and right arm brachial artery to cephalic vein AV fistula.  Tolerated HD well.  Plan for [place] next HD today. Will need CLIP.  #  HTN: Expect to improve with HD.  UF during HD.  # Anemia of CKD: Hemoglobin at goal, no ESA needed  #Bone/ mineral: Phosphorus level expect to improve after HD.  Follow-up PTH level.  #  CHF: DC Lasix since he is now on HD.  # Acute hypoxic and hypercarbic RF: on BiPaP for now  # Dispo: pending, need CLIP.  Subjective: Seen and examined at bedside.  Using BiPAP.  Denies nausea vomiting chest pain.  Some shortness of breath. Objective Vital signs in last 24 hours: Vitals:   07/02/19 0516 07/02/19 0525 07/02/19 0810 07/02/19 0829  BP:   (!) 146/84 (!) 146/84  Pulse: 80  80 77  Resp: (!) 28  17 20   Temp:  98.6 F (37 C) 98 F (36.7 C)   TempSrc:  Axillary Axillary   SpO2: 95%  96% 95%  Weight:  (!) 160 kg     Weight change: 1.3 kg  Intake/Output Summary (Last 24 hours) at 07/02/2019 0858 Last data filed at 07/02/2019 0500 Gross per 24 hour  Intake 739.29 ml  Output 2375 ml  Net -1635.71 ml       Labs: Basic Metabolic Panel: Recent Labs  Lab 06/30/19 0624 07/01/19 0223 07/02/19 0512  NA 139 141 140  K 3.9 5.1 4.8  CL 100 103 99  CO2 24 26 25   GLUCOSE 106* 120* 83  BUN 44* 53* 45*  CREATININE 6.80* 7.51* 6.80*  CALCIUM 8.1* 8.1* 7.7*  PHOS  --  8.8* 6.9*   Liver Function Tests: Recent Labs  Lab 06/30/19 1952 07/01/19 0223 07/02/19 0512  AST 17  --   --   ALT 12  --   --   ALKPHOS 44  --   --   BILITOT 0.7  --   --   PROT 8.0  --   --   ALBUMIN 2.9* 2.8* 2.6*   No results for input(s): LIPASE, AMYLASE in the  last 168 hours. No results for input(s): AMMONIA in the last 168 hours. CBC: Recent Labs  Lab 06/30/19 0624 06/30/19 0624 06/30/19 2055 07/01/19 0223 07/02/19 0512  WBC 5.9   < > 5.8 6.2 7.7  HGB 13.5   < > 13.1 13.1 12.3*  HCT 44.5   < > 45.2 44.2 40.7  MCV 92.7  --  95.2 94.8 93.1  PLT 184   < > 193 199 162   < > = values in this interval not displayed.   Cardiac Enzymes: No results for input(s): CKTOTAL, CKMB, CKMBINDEX, TROPONINI in the last 168 hours. CBG: Recent Labs  Lab 07/01/19 1401 07/01/19 1830 07/01/19 2115 07/02/19 0559 07/02/19 0759  GLUCAP 110* 86 91 82 88    Iron Studies: No results for input(s): IRON, TIBC, TRANSFERRIN, FERRITIN in the last 72 hours. Studies/Results: DG CHEST PORT 1 VIEW  Result Date: 07/01/2019 CLINICAL DATA:  Postoperative state. Insertion of tunneled dialysis catheter. EXAM: PORTABLE CHEST 1 VIEW COMPARISON:  06/30/2019 FINDINGS: Double lumen central venous catheter appears in good position in the  superior vena cava at the level of the carina. The patient has chronic prominence of the right heart and pulmonary arteries as demonstrated on the prior CT scan of 09/12/2006. There is slight vascular congestion on the left. No pneumothorax. No acute bone abnormality. IMPRESSION: Central venous catheter in good position. No pneumothorax. Chronic prominence of the right heart and pulmonary arteries. Electronically Signed   By: Lorriane Shire M.D.   On: 07/01/2019 14:23   ECHOCARDIOGRAM COMPLETE  Result Date: 07/01/2019   ECHOCARDIOGRAM REPORT   Patient Name:   Adam Dennis Date of Exam: 07/01/2019 Medical Rec #:  [id number]       Height:       72.0 in Accession #:    [id number]      Weight:       357.1 lb Date of Birth:  [birth year]        BSA:          2.73 m Patient Age:    59 years        BP:           116/64 mmHg Patient Gender: M               HR:           87 bpm. Exam Location:  Inpatient Procedure: 2D Echo, Color Doppler, Cardiac Doppler and  Intracardiac            Opacification Agent STAT ECHO Indications:    W73.71 Acute diastolic (congestive) heart failure  History:        Patient has prior history of Echocardiogram examinations, most                 recent 04/16/2017. CHF, COPD; Risk Factors:Hypertension,                 Diabetes, Dyslipidemia and Sleep Apnea.  Sonographer:    Hall Referring Phys: [phone] [facility]  Sonographer Comments: Image acquisition challenging due to patient body habitus. IMPRESSIONS  1. Left ventricular ejection fraction, by visual estimation, is 60 to 65%. The left ventricle has normal function. There is no left ventricular hypertrophy.  2. Definity contrast agent was given IV to delineate the left ventricular endocardial borders.  3. The left ventricle has no regional wall motion abnormalities.  4. Abnormal septal motion.  5. Global right ventricle has normal systolic function.The right ventricular size is normal. No increase in right ventricular wall thickness.  6. Left atrial size was normal.  7. Right atrial size was mildly dilated.  8. The mitral valve is normal in structure. Trivial mitral valve regurgitation. No evidence of mitral stenosis.  9. The tricuspid valve is normal in structure. 10. The tricuspid valve is normal in structure. Tricuspid valve regurgitation is not demonstrated. 11. The aortic valve was not well visualized. Aortic valve regurgitation is not visualized. No evidence of aortic valve sclerosis or stenosis. 12. The pulmonic valve was not well visualized. Pulmonic valve regurgitation is not visualized. 13. The inferior vena cava is normal in size with greater than 50% respiratory variability, suggesting right atrial pressure of 3 mmHg. 14. The interatrial septum was not well visualized. FINDINGS  Left Ventricle: Left ventricular ejection fraction, by visual estimation, is 60 to 65%. The left ventricle has normal function. Definity contrast agent was given IV to delineate the  left ventricular endocardial borders. The left ventricle has no regional wall motion abnormalities. There is no left ventricular hypertrophy. Left ventricular diastolic parameters  were normal. Normal left atrial pressure. Abnormal septal motion. Right Ventricle: The right ventricular size is normal. No increase in right ventricular wall thickness. Global RV systolic function is has normal systolic function. Left Atrium: Left atrial size was normal in size. Right Atrium: Right atrial size was mildly dilated Pericardium: There is no evidence of pericardial effusion. Mitral Valve: The mitral valve is normal in structure. There is mild thickening of the mitral valve leaflet(s). Trivial mitral valve regurgitation. No evidence of mitral valve stenosis by observation. Tricuspid Valve: The tricuspid valve is normal in structure. Tricuspid valve regurgitation is not demonstrated. Aortic Valve: The aortic valve was not well visualized. Aortic valve regurgitation is not visualized. The aortic valve is structurally normal, with no evidence of sclerosis or stenosis. Pulmonic Valve: The pulmonic valve was not well visualized. Pulmonic valve regurgitation is not visualized. Pulmonic regurgitation is not visualized. Aorta: The aortic root, ascending aorta and aortic arch are all structurally normal, with no evidence of dilitation or obstruction. Venous: The inferior vena cava is normal in size with greater than 50% respiratory variability, suggesting right atrial pressure of 3 mmHg. IAS/Shunts: The interatrial septum was not well visualized. There is no evidence of a patent foramen ovale. No ventricular septal defect is seen or detected. There is no evidence of an atrial septal defect.  LEFT VENTRICLE PLAX 2D LVOT diam:     2.00 cm  Diastology LVOT Area:     3.14 cm LV e' lateral:   9.90 cm/s                         LV E/e' lateral: 9.3                         LV e' medial:    7.29 cm/s                         LV E/e' medial:   12.6  RIGHT VENTRICLE RV S prime:     13.90 cm/s TAPSE (M-mode): 1.8 cm LEFT ATRIUM           Index       RIGHT ATRIUM           Index LA diam:      3.70 cm 1.36 cm/m  RA Area:     35.40 cm LA Vol (A4C): 81.2 ml 29.79 ml/m RA Volume:   158.00 ml 57.96 ml/m  AORTIC VALVE LVOT Vmax:   125.00 cm/s LVOT Vmean:  88.800 cm/s LVOT VTI:    0.263 m  AORTA Ao Root diam: 3.10 cm Ao Asc diam:  3.10 cm MITRAL VALVE MV Area (PHT): 3.99 cm             SHUNTS MV PHT:        55.10 msec           Systemic VTI:  0.26 m MV Decel Time: 190 msec             Systemic Diam: 2.00 cm MV E velocity: 92.20 cm/s 103 cm/s MV A velocity: 86.80 cm/s 70.3 cm/s MV E/A ratio:  1.06       1.5  Jenkins Rouge MD Electronically signed by Jenkins Rouge MD Signature Date/Time: 07/01/2019/9:11:18 AM    Final    HYBRID OR IMAGING ([place])  Result Date: 07/01/2019 There is no interpretation for this exam.  This order is for images  obtained during a surgical procedure.  Please See "Surgeries" Tab for more information regarding the procedure.    Medications: Infusions: . sodium chloride    . sodium chloride    . furosemide Stopped (07/02/19 0431)    Scheduled Medications: . aspirin EC  81 mg Oral Daily  . atorvastatin  40 mg Oral q1800  . calcitRIOL  0.25 mcg Oral Daily  . calcium carbonate  1 tablet Oral BID  . Chlorhexidine Gluconate Cloth  6 each Topical Q0600  . cholecalciferol  2,000 Units Oral Daily  . ferrous sulfate  325 mg Oral Q breakfast  . heparin injection (subcutaneous)  5,000 Units Subcutaneous Q8H  . insulin aspart  0-5 Units Subcutaneous QHS  . insulin aspart  0-9 Units Subcutaneous TID WC  . latanoprost  1 drop Both Eyes QHS  . loratadine  10 mg Oral Daily  . mometasone-formoterol  2 puff Inhalation BID  . sertraline  25 mg Oral Daily  . timolol  1 drop Both Eyes Daily    have reviewed scheduled and prn medications.  Physical Exam: General:NAD, comfortable, using BiPAP Heart:RRR, s1s2 nl Lungs:clear b/l, no  crackle Abdomen:soft, Non-tender, non-distended Extremities:No edema Dialysis Access: Right IJ TDC and right arm brachial artery to cephalic vein AV fistula site clean.  Some postsurgical erythema  Adam Dennis Adam Dennis 07/02/2019,8:58 AM  LOS: 2 days  Pager: [phone]

## 2019-07-02 NOTE — Progress Notes (Addendum)
Pt received from dialysis. RRT at bedside assisting with bipap. VSS. Pt denies needs at this time.  Arletta Bale, RN

## 2019-07-02 NOTE — Progress Notes (Signed)
  Progress Note    07/02/2019 7:58 AM 1 Day Post-Op  Subjective:  Very minimal right arm pain at surgical site. No numbness, weakness or pain in right hand. Waldo placed 2/4 with HD last night   Vitals:   07/02/19 0516 07/02/19 0525  BP:    Pulse: 80   Resp: (!) 28   Temp:  98.6 F (37 C)  SpO2: 95%    Physical Exam: Lungs:  On Bipap machine. Nonlabored Chest: Right IJ TDC in place dressings clean and dry Incisions:  Right arm AC incision site clean, dry, intact. No swelling. No ecchymosis or erythema. Minimal tenderness to palpation. Good thrill. Extremities: 2+ right radial pulse, right hand warm, normal grip strength right hand Abdomen:  Obese Neurologic: appropriate affect  CBC    Component Value Date/Time   WBC 7.7 07/02/2019 0512   RBC 4.37 07/02/2019 0512   HGB 12.3 (L) 07/02/2019 0512   HGB 12.8 (L) 03/24/2019 1017   HCT 40.7 07/02/2019 0512   HCT 39.7 03/24/2019 1017   PLT 162 07/02/2019 0512   PLT 191 03/24/2019 1017   MCV 93.1 07/02/2019 0512   MCV 87 03/24/2019 1017   MCH 28.1 07/02/2019 0512   MCHC 30.2 07/02/2019 0512   RDW 17.0 (H) 07/02/2019 0512   RDW 13.9 03/24/2019 1017   LYMPHSABS 1.3 03/24/2019 1017   MONOABS 0.8 04/28/2017 1236   EOSABS 0.0 03/24/2019 1017   BASOSABS 0.0 03/24/2019 1017    BMET    Component Value Date/Time   NA 140 07/02/2019 0512   NA 141 03/24/2019 1017   K 4.8 07/02/2019 0512   CL 99 07/02/2019 0512   CO2 25 07/02/2019 0512   GLUCOSE 83 07/02/2019 0512   BUN 45 (H) 07/02/2019 0512   BUN 36 (H) 03/24/2019 1017   CREATININE 6.80 (H) 07/02/2019 0512   CREATININE 2.89 (H) 11/30/2015 1250   CALCIUM 7.7 (L) 07/02/2019 0512   GFRNONAA 8 (L) 07/02/2019 0512   GFRNONAA 24 (L) 11/30/2015 1250   GFRAA 9 (L) 07/02/2019 0512   GFRAA 27 (L) 11/30/2015 1250    INR    Component Value Date/Time   INR 1.05 04/16/2017 0343     Intake/Output Summary (Last 24 hours) at 07/02/2019 0758 Last data filed at 07/02/2019 0500 Gross  per 24 hour  Intake 739.29 ml  Output 2375 ml  Net -1635.71 ml     Assessment/Plan:  59 y.o. male is s/p right IJ TDC and right arm brachial cephalic AV fistula 1 Day Post-Op. He will follow up in the office for ultrasound in 4-6 weeks  DVT prophylaxis:  Sq heparin   Karoline Caldwell, PA-C [facility] [phone] 07/02/2019 7:58 AM

## 2019-07-02 NOTE — Progress Notes (Signed)
Subjective:  Patient denies any chest pain or shortness of breath remains on BiPAP.  Tolerated hemodialysis yesterday.  Objective:  Vital Signs in the last 24 hours: Temp:  [97 F (36.1 C)-98.7 F (37.1 C)] 98.7 F (37.1 C) (02/05 1000) Pulse Rate:  [68-111] 68 (02/05 1130) Resp:  [13-29] 13 (02/05 1000) BP: (105-184)/(64-134) 144/75 (02/05 1130) SpO2:  [95 %-100 %] 97 % (02/05 1000) Weight:  [160 kg-163.3 kg] 160 kg (02/05 1000)  Intake/Output from previous day: 02/04 0701 - 02/05 0700 In: 739.3 [I.V.:500; IV Piggyback:239.3] Out: 2390 [Urine:390] Intake/Output from this shift: No intake/output data recorded.  Physical Exam: Neck: no adenopathy, no carotid bruit, no JVD and supple, symmetrical, trachea midline Lungs: clear to auscultation anterolaterally decreased breath sounds at bases Heart: regular rate and rhythm, S1, S2 normal and soft systolic murmur noted.  No pericardial rub Abdomen: soft, non-tender; bowel sounds normal; no masses,  no organomegaly Extremities: no clubbing, cyanosis.  Trace edema noted Right anterior cubital.  Thrill noted Lab Results: Recent Labs    07/01/19 0223 07/02/19 0512  WBC 6.2 7.7  HGB 13.1 12.3*  PLT 199 162   Recent Labs    07/01/19 0223 07/02/19 0512  NA 141 140  K 5.1 4.8  CL 103 99  CO2 26 25  GLUCOSE 120* 83  BUN 53* 45*  CREATININE 7.51* 6.80*   No results for input(s): TROPONINI in the last 72 hours.  Invalid input(s): CK, MB Hepatic Function Panel Recent Labs    06/30/19 1952 07/01/19 0223 07/02/19 0512  PROT 8.0  --   --   ALBUMIN 2.9*   < > 2.6*  AST 17  --   --   ALT 12  --   --   ALKPHOS 44  --   --   BILITOT 0.7  --   --   BILIDIR 0.3*  --   --   IBILI 0.4  --   --    < > = values in this interval not displayed.   No results for input(s): CHOL in the last 72 hours. No results for input(s): PROTIME in the last 72 hours.  Imaging: DG CHEST PORT 1 VIEW  Result Date: 07/01/2019 CLINICAL DATA:   Postoperative state. Insertion of tunneled dialysis catheter. EXAM: PORTABLE CHEST 1 VIEW COMPARISON:  06/30/2019 FINDINGS: Double lumen central venous catheter appears in good position in the superior vena cava at the level of the carina. The patient has chronic prominence of the right heart and pulmonary arteries as demonstrated on the prior CT scan of 09/12/2006. There is slight vascular congestion on the left. No pneumothorax. No acute bone abnormality. IMPRESSION: Central venous catheter in good position. No pneumothorax. Chronic prominence of the right heart and pulmonary arteries. Electronically Signed   By: Lorriane Shire M.D.   On: 07/01/2019 14:23   ECHOCARDIOGRAM COMPLETE  Result Date: 07/01/2019   ECHOCARDIOGRAM REPORT   Patient Name:   Adam Dennis Date of Exam: 07/01/2019 Medical Rec #:  [id number]       Height:       72.0 in Accession #:    [id number]      Weight:       357.1 lb Date of Birth:  [birth year]        BSA:          2.73 m Patient Age:    59 years        BP:  116/64 mmHg Patient Gender: M               HR:           87 bpm. Exam Location:  Inpatient Procedure: 2D Echo, Color Doppler, Cardiac Doppler and Intracardiac            Opacification Agent STAT ECHO Indications:    J18.84 Acute diastolic (congestive) heart failure  History:        Patient has prior history of Echocardiogram examinations, most                 recent 04/16/2017. CHF, COPD; Risk Factors:Hypertension,                 Diabetes, Dyslipidemia and Sleep Apnea.  Sonographer:    [place] Referring Phys: [phone] [facility]  Sonographer Comments: Image acquisition challenging due to patient body habitus. IMPRESSIONS  1. Left ventricular ejection fraction, by visual estimation, is 60 to 65%. The left ventricle has normal function. There is no left ventricular hypertrophy.  2. Definity contrast agent was given IV to delineate the left ventricular endocardial borders.  3. The left ventricle has no  regional wall motion abnormalities.  4. Abnormal septal motion.  5. Global right ventricle has normal systolic function.The right ventricular size is normal. No increase in right ventricular wall thickness.  6. Left atrial size was normal.  7. Right atrial size was mildly dilated.  8. The mitral valve is normal in structure. Trivial mitral valve regurgitation. No evidence of mitral stenosis.  9. The tricuspid valve is normal in structure. 10. The tricuspid valve is normal in structure. Tricuspid valve regurgitation is not demonstrated. 11. The aortic valve was not well visualized. Aortic valve regurgitation is not visualized. No evidence of aortic valve sclerosis or stenosis. 12. The pulmonic valve was not well visualized. Pulmonic valve regurgitation is not visualized. 13. The inferior vena cava is normal in size with greater than 50% respiratory variability, suggesting right atrial pressure of 3 mmHg. 14. The interatrial septum was not well visualized. FINDINGS  Left Ventricle: Left ventricular ejection fraction, by visual estimation, is 60 to 65%. The left ventricle has normal function. Definity contrast agent was given IV to delineate the left ventricular endocardial borders. The left ventricle has no regional wall motion abnormalities. There is no left ventricular hypertrophy. Left ventricular diastolic parameters were normal. Normal left atrial pressure. Abnormal septal motion. Right Ventricle: The right ventricular size is normal. No increase in right ventricular wall thickness. Global RV systolic function is has normal systolic function. Left Atrium: Left atrial size was normal in size. Right Atrium: Right atrial size was mildly dilated Pericardium: There is no evidence of pericardial effusion. Mitral Valve: The mitral valve is normal in structure. There is mild thickening of the mitral valve leaflet(s). Trivial mitral valve regurgitation. No evidence of mitral valve stenosis by observation. Tricuspid Valve:  The tricuspid valve is normal in structure. Tricuspid valve regurgitation is not demonstrated. Aortic Valve: The aortic valve was not well visualized. Aortic valve regurgitation is not visualized. The aortic valve is structurally normal, with no evidence of sclerosis or stenosis. Pulmonic Valve: The pulmonic valve was not well visualized. Pulmonic valve regurgitation is not visualized. Pulmonic regurgitation is not visualized. Aorta: The aortic root, ascending aorta and aortic arch are all structurally normal, with no evidence of dilitation or obstruction. Venous: The inferior vena cava is normal in size with greater than 50% respiratory variability, suggesting right atrial pressure  of 3 mmHg. IAS/Shunts: The interatrial septum was not well visualized. There is no evidence of a patent foramen ovale. No ventricular septal defect is seen or detected. There is no evidence of an atrial septal defect.  LEFT VENTRICLE PLAX 2D LVOT diam:     2.00 cm  Diastology LVOT Area:     3.14 cm LV e' lateral:   9.90 cm/s                         LV E/e' lateral: 9.3                         LV e' medial:    7.29 cm/s                         LV E/e' medial:  12.6  RIGHT VENTRICLE RV S prime:     13.90 cm/s TAPSE (M-mode): 1.8 cm LEFT ATRIUM           Index       RIGHT ATRIUM           Index LA diam:      3.70 cm 1.36 cm/m  RA Area:     35.40 cm LA Vol (A4C): 81.2 ml 29.79 ml/m RA Volume:   158.00 ml 57.96 ml/m  AORTIC VALVE LVOT Vmax:   125.00 cm/s LVOT Vmean:  88.800 cm/s LVOT VTI:    0.263 m  AORTA Ao Root diam: 3.10 cm Ao Asc diam:  3.10 cm MITRAL VALVE MV Area (PHT): 3.99 cm             SHUNTS MV PHT:        55.10 msec           Systemic VTI:  0.26 m MV Decel Time: 190 msec             Systemic Diam: 2.00 cm MV E velocity: 92.20 cm/s 103 cm/s MV A velocity: 86.80 cm/s 70.3 cm/s MV E/A ratio:  1.06       1.5  Jenkins Rouge MD Electronically signed by Jenkins Rouge MD Signature Date/Time: 07/01/2019/9:11:18 AM    Final    HYBRID  OR IMAGING ([place])  Result Date: 07/01/2019 There is no interpretation for this exam.  This order is for images obtained during a surgical procedure.  Please See "Surgeries" Tab for more information regarding the procedure.    Cardiac Studies:  Assessment/Plan:  Resolving Decompensated congestive heart failure secondary to preserved LV systolic function . Secondary to increased fluid intake/worsening renal function Status post hypotensive shock probably secondary to medications Hypertension. Diabetes mellitus. Acute on chronic kidney disease stage IV. Probably component of cardiorenal syndrome COPD Morbid obesity. Obstructive sleep apnea/obesity hypoventilation syndrome on CPAP. Plan Continue present management. Dr. Doylene Canard on call for me for weekend   LOS: 2 days    Charolette Forward 07/02/2019, 12:03 PM

## 2019-07-02 NOTE — Progress Notes (Signed)
RT NOTES: Transported patient from room 4E11 to dialysis on bipap without complications.

## 2019-07-02 NOTE — Discharge Instructions (Signed)
° °  [facility] ° °Discharge Instructions ° °AV Fistula or Graft Surgery for Dialysis Access ° °Please refer to the following instructions for your post-procedure care. Your surgeon or physician assistant will discuss any changes with you. ° °Activity ° °You may drive the day following your surgery, if you are comfortable and no longer taking prescription pain medication. Resume full activity as the soreness in your incision resolves. ° °Bathing/Showering ° °You may shower after you go home. Keep your incision dry for 48 hours. Do not soak in a bathtub, hot tub, or swim until the incision heals completely. You may not shower if you have a hemodialysis catheter. ° °Incision Care ° °Clean your incision with mild soap and water after 48 hours. Pat the area dry with a clean towel. You do not need a bandage unless otherwise instructed. Do not apply any ointments or creams to your incision. You may have skin glue on your incision. Do not peel it off. It will come off on its own in about one week. Your arm may swell a bit after surgery. To reduce swelling use pillows to elevate your arm so it is above your heart. Your doctor will tell you if you need to lightly wrap your arm with an ACE bandage. ° °Diet ° °Resume your normal diet. There are not special food restrictions following this procedure. In order to heal from your surgery, it is CRITICAL to get adequate nutrition. Your body requires vitamins, minerals, and protein. Vegetables are the best source of vitamins and minerals. Vegetables also provide the perfect balance of protein. Processed food has little nutritional value, so try to avoid this. ° °Medications ° °Resume taking all of your medications. If your incision is causing pain, you may take over-the counter pain relievers such as acetaminophen (Tylenol). If you were prescribed a stronger pain medication, please be aware these medications can cause nausea and constipation. Prevent  nausea by taking the medication with a snack or meal. Avoid constipation by drinking plenty of fluids and eating foods with high amount of fiber, such as fruits, vegetables, and grains. Do not take Tylenol if you are taking prescription pain medications. ° ° ° ° °Follow up °Your surgeon may want to see you in the office following your access surgery. If so, this will be arranged at the time of your surgery. ° °Please call us immediately for any of the following conditions: ° °Increased pain, redness, drainage (pus) from your incision site °Fever of 101 degrees or higher °Severe or worsening pain at your incision site °Hand pain or numbness. ° °Reduce your risk of vascular disease: ° °Stop smoking. If you would like help, call [facility] at 1-800-QUIT-NOW (1-800-784-8669) or [place] at 336-586-4000 ° °Manage your cholesterol °Maintain a desired weight °Control your diabetes °Keep your blood pressure down ° °Dialysis ° °It will take several weeks to several months for your new dialysis access to be ready for use. Your surgeon will determine when it is OK to use it. Your nephrologist will continue to direct your dialysis. You can continue to use your Permcath until your new access is ready for use. ° °If you have any questions, please call the office at [phone]. ° °

## 2019-07-02 NOTE — Progress Notes (Signed)
  Progress Note    07/02/2019 8:05 AM 1 Day Post-Op  Subjective: No overnight issues, dialyzed yesterday  Vitals:   07/02/19 0516 07/02/19 0525  BP:    Pulse: 80   Resp: (!) 28   Temp:  98.6 F (37 C)  SpO2: 95%     Physical Exam: Awake alert oriented on BiPAP Right IJ tunnel catheter in place without hematoma Right upper arm with strong thrill incision clean dry intact Hand is warm and well-perfused  CBC    Component Value Date/Time   WBC 7.7 07/02/2019 0512   RBC 4.37 07/02/2019 0512   HGB 12.3 (L) 07/02/2019 0512   HGB 12.8 (L) 03/24/2019 1017   HCT 40.7 07/02/2019 0512   HCT 39.7 03/24/2019 1017   PLT 162 07/02/2019 0512   PLT 191 03/24/2019 1017   MCV 93.1 07/02/2019 0512   MCV 87 03/24/2019 1017   MCH 28.1 07/02/2019 0512   MCHC 30.2 07/02/2019 0512   RDW 17.0 (H) 07/02/2019 0512   RDW 13.9 03/24/2019 1017   LYMPHSABS 1.3 03/24/2019 1017   MONOABS 0.8 04/28/2017 1236   EOSABS 0.0 03/24/2019 1017   BASOSABS 0.0 03/24/2019 1017    BMET    Component Value Date/Time   NA 140 07/02/2019 0512   NA 141 03/24/2019 1017   K 4.8 07/02/2019 0512   CL 99 07/02/2019 0512   CO2 25 07/02/2019 0512   GLUCOSE 83 07/02/2019 0512   BUN 45 (H) 07/02/2019 0512   BUN 36 (H) 03/24/2019 1017   CREATININE 6.80 (H) 07/02/2019 0512   CREATININE 2.89 (H) 11/30/2015 1250   CALCIUM 7.7 (L) 07/02/2019 0512   GFRNONAA 8 (L) 07/02/2019 0512   GFRNONAA 24 (L) 11/30/2015 1250   GFRAA 9 (L) 07/02/2019 0512   GFRAA 27 (L) 11/30/2015 1250    INR    Component Value Date/Time   INR 1.05 04/16/2017 0343     Intake/Output Summary (Last 24 hours) at 07/02/2019 0805 Last data filed at 07/02/2019 0500 Gross per 24 hour  Intake 739.29 ml  Output 2375 ml  Net -1635.71 ml     Assessment/plan:  59 y.o. male is s/p right IJ tunneled dialysis catheter and right arm brachiocephalic AV fistula creation.  Will likely need superficialization of the fistula in the future given the depth  of the vein.  We will arrange follow-up in the office in 6 weeks with duplex.    Romesha Scherer C. Donzetta Matters, MD [facility] Office: [phone] Pager: [phone]  07/02/2019 8:05 AM

## 2019-07-02 NOTE — Progress Notes (Signed)
Patient transported from dialysis to 4E11. Patient tolerated well. No complications. Vitals stable throughout. RT will continue to monitor.

## 2019-07-02 NOTE — Progress Notes (Signed)
Subjective: HD#2 Events Overnight: no events overnight. HD removed 2 liters of fluids  Patient was seen this morning on rounds. Patient resting comfortably in dialysis with BIPAP on.   Objective:  Vital signs in last 24 hours: Vitals:   07/01/19 2319 07/02/19 0044 07/02/19 0416 07/02/19 0516  BP:  (!) 154/71    Pulse: 80 81 80 80  Resp: (!) 23 (!) 24 (!) 27 (!) 28  Temp:  98.5 F (36.9 C)    TempSrc:  Axillary    SpO2: 97% 96% 96% 95%  Weight:        Physical Exam: Physical Exam  Constitutional: He is oriented to person, place, and time. No distress.  HENT:  Head: Normocephalic and atraumatic.  Eyes: EOM are normal.  Cardiovascular: Normal rate, regular rhythm and normal heart sounds. Exam reveals no gallop and no friction rub.  No murmur heard. Pulmonary/Chest: Tachypnea noted.  On BIPAP  Musculoskeletal:        General: Edema present. No tenderness.  Neurological: He is alert and oriented to person, place, and time.  Skin: Skin is warm and dry. He is not diaphoretic.    Filed Weights   07/01/19 0541 07/01/19 1507 07/01/19 1753  Weight: (!) 162 kg (!) 163.3 kg (!) 160.4 kg     Intake/Output Summary (Last 24 hours) at 07/02/2019 0539 Last data filed at 07/02/2019 0500 Gross per 24 hour  Intake 774.29 ml  Output 2390 ml  Net -1615.71 ml    Pertinent labs/Imaging: CBC Latest Ref Rng & Units 07/02/2019 07/01/2019 06/30/2019  WBC 4.0 - 10.5 K/uL 7.7 6.2 5.8  Hemoglobin 13.0 - 17.0 g/dL 12.3(L) 13.1 13.1  Hematocrit 39.0 - 52.0 % 40.7 44.2 45.2  Platelets 150 - 400 K/uL 162 199 193    CMP Latest Ref Rng & Units 07/01/2019 06/30/2019 03/24/2019  Glucose 70 - 99 mg/dL 120(H) 106(H) 116(H)  BUN 6 - 20 mg/dL 53(H) 44(H) 36(H)  Creatinine 0.61 - 1.24 mg/dL 7.51(H) 6.80(H) 4.40(H)  Sodium 135 - 145 mmol/L 141 139 141  Potassium 3.5 - 5.1 mmol/L 5.1 3.9 4.7  Chloride 98 - 111 mmol/L 103 100 103  CO2 22 - 32 mmol/L 26 24 28   Calcium 8.9 - 10.3 mg/dL 8.1(L) 8.1(L) 8.8    Total Protein 6.5 - 8.1 g/dL - 8.0 6.9  Total Bilirubin 0.3 - 1.2 mg/dL - 0.7 0.5  Alkaline Phos 38 - 126 U/L - 44 48  AST 15 - 41 U/L - 17 12  ALT 0 - 44 U/L - 12 10    FIO2  60.00   pH, Arterial 7.350 - 7.450 7.238Low    pCO2 arterial 32.0 - 48.0 mmHg 69.5High Panic    Comment: CRITICAL RESULT CALLED TO, READ BACK BY AND VERIFIED WITH:  R PUCKETT,RN 07/02/2019 0442 WILDERK   pO2, Arterial 83.0 - 108.0 mmHg 97.6   Bicarbonate 20.0 - 28.0 mmol/L 28.5High    Acid-Base Excess 0.0 - 2.0 mmol/L 1.8   O2 Saturation % 96.6   Patient temperature  37.1   Collection site  LEFT RADIAL   Drawn by  [id number]   Comment: COLLECTED BY RT  Sample type  ARTERIAL DRAW   Allens test (pass/fail) PASS PASS      Assessment/Plan:  Principal Problem:   Acute on chronic heart failure with preserved ejection fraction (HFpEF) (HCC) Active Problems:   Diabetes mellitus type 2 in obese (HCC)   Obesity hypoventilation syndrome (HCC)   Acute on chronic renal  failure [facility])    Patient Summary: Adam Dennis is a 59 y.o. with pertinent PMH of HFrEF (LV function now recovered), HTN, OSA, HLD, and ESRD admit for Acute hypercarbic respiratory failure 2/2 to OHS and ESRD on hospital day 2  #Acute Hypercarbic/Hypoxic Respiratory Failure  #Obesity Hypoventilation Syndrome #Acute Respiratory Acidosis #Cardiorenal Syndrome  #ESRD: Patient is status post vascular access and doing well.  Repeat ABG showed improvement of his hypercarbic/hypoxic respiratory failure after receiving dialysis yesterday evening. Patient had roughly 2 L removed during HD.  -Continue BiPAP as needed - Repeat HD today - Patient getting CLIP for out patient dialysis  - Will check PTH today.  - Appreciate VVS and nephrologies assistance.   #Hypothermia: Patient has clinically improved since this episode.  He has no leukocytosis or lactic acidosis.  His temperature is within normal limits.  - Blood cultures negative at 48  hours  #HTN:  - Continue to hold antihypertensive medications in the setting of diuresis and recent dialysis to prevent hypotension.    Diet: Renal  IVF: none VTE: Heparin Code: Full  Dispo: Anticipated discharge pending clinical improvement.    Marianna Payment, D.O. [facility], PGY-1 Date 07/02/2019 Time 5:39 AM Pager: [phone]

## 2019-07-03 LAB — GLUCOSE, CAPILLARY
Glucose-Capillary: 66 mg/dL — ABNORMAL LOW (ref 70–99)
Glucose-Capillary: 63 mg/dL — ABNORMAL LOW (ref 70–99)
Glucose-Capillary: 134 mg/dL — ABNORMAL HIGH (ref 70–99)
Glucose-Capillary: 96 mg/dL (ref 70–99)
Glucose-Capillary: 96 mg/dL (ref 70–99)

## 2019-07-03 LAB — RENAL FUNCTION PANEL
Albumin: 2.3 g/dL — ABNORMAL LOW (ref 3.5–5.0)
Anion gap: 12 (ref 5–15)
BUN: 36 mg/dL — ABNORMAL HIGH (ref 6–20)
CO2: 27 mmol/L (ref 22–32)
Calcium: 7.5 mg/dL — ABNORMAL LOW (ref 8.9–10.3)
Chloride: 98 mmol/L (ref 98–111)
Creatinine, Ser: 5.57 mg/dL — ABNORMAL HIGH (ref 0.61–1.24)
GFR calc Af Amer: 12 mL/min — ABNORMAL LOW (ref >60)
GFR calc non Af Amer: 10 mL/min — ABNORMAL LOW (ref >60)
Glucose, Bld: 101 mg/dL — ABNORMAL HIGH (ref 70–99)
Phosphorus: 5.3 mg/dL — ABNORMAL HIGH (ref 2.5–4.6)
Potassium: 3.7 mmol/L (ref 3.5–5.1)
Sodium: 137 mmol/L (ref 135–145)

## 2019-07-03 LAB — CBC
HCT: 39.2 % (ref 39.0–52.0)
Hemoglobin: 11.6 g/dL — ABNORMAL LOW (ref 13.0–17.0)
MCH: 27.6 pg (ref 26.0–34.0)
MCHC: 29.6 g/dL — ABNORMAL LOW (ref 30.0–36.0)
MCV: 93.3 fL (ref 80.0–100.0)
Platelets: 159 10*3/uL (ref 150–400)
RBC: 4.2 MIL/uL — ABNORMAL LOW (ref 4.22–5.81)
RDW: 16.7 % — ABNORMAL HIGH (ref 11.5–15.5)
WBC: 5.5 10*3/uL (ref 4.0–10.5)
nRBC: 0.4 % — ABNORMAL HIGH (ref 0.0–0.2)

## 2019-07-03 LAB — PARATHYROID HORMONE, INTACT (NO CA): PTH: 247 pg/mL — ABNORMAL HIGH (ref 15–65)

## 2019-07-03 MED ORDER — LIDOCAINE HCL (PF) 1 % IJ SOLN: 5.0000 mL | INTRAMUSCULAR | Status: DC | PRN

## 2019-07-03 MED ORDER — HEPARIN SODIUM (PORCINE) 1000 UNIT/ML DIALYSIS: 20.0000 [IU]/kg | INTRAMUSCULAR | Status: DC | PRN

## 2019-07-03 MED ORDER — ALTEPLASE 2 MG IJ SOLR: 2.0000 mg | Freq: Once | INTRAMUSCULAR | Status: DC | PRN

## 2019-07-03 MED ORDER — HEPARIN SODIUM (PORCINE) 1000 UNIT/ML DIALYSIS: 1000.0000 [IU] | INTRAMUSCULAR | Status: DC | PRN

## 2019-07-03 MED ORDER — SODIUM CHLORIDE 0.9 % IV SOLN: 100.0000 mL | INTRAVENOUS | Status: DC | PRN

## 2019-07-03 MED ORDER — LIDOCAINE-PRILOCAINE 2.5-2.5 % EX CREA: 1.0000 "application " | TOPICAL_CREAM | CUTANEOUS | Status: DC | PRN

## 2019-07-03 MED ORDER — HEPARIN SODIUM (PORCINE) 1000 UNIT/ML IJ SOLN
INTRAMUSCULAR | Status: AC
  Administered 2019-07-03: 3200 [IU] via INTRAVENOUS_CENTRAL
  Filled 2019-07-03: qty 3

## 2019-07-03 MED ORDER — PENTAFLUOROPROP-TETRAFLUOROETH EX AERO: 1.0000 "application " | INHALATION_SPRAY | CUTANEOUS | Status: DC | PRN

## 2019-07-03 NOTE — Progress Notes (Signed)
   Subjective:   Adam Dennis was laying down and getting HD. States that he feels better and breathing improving. Denies any chest pain. States has occasional cramps in feet and arms when getting HD.   Objective:  Vital signs in last 24 hours: Vitals:   07/03/19 1000 07/03/19 1030 07/03/19 1123 07/03/19 1128  BP: 119/61 121/72  139/80  Pulse: 65 61 90   Resp:   (!) 22 (!) 25  Temp:    98.9 F (37.2 C)  TempSrc:    Axillary  SpO2:    100%  Weight:       Physical Exam  Constitutional: He appears well-developed and well-nourished. No distress.  HENT:  Head: Normocephalic and atraumatic.  Eyes: Conjunctivae are normal.  Cardiovascular: Normal rate, regular rhythm and normal heart sounds.  TD cath in place without surrounding erythema   Respiratory: Effort normal and breath sounds normal. No respiratory distress. He has no wheezes.  bipap mask on   GI: Soft. Bowel sounds are normal. He exhibits no distension. There is no abdominal tenderness.  Musculoskeletal:        General: No edema.  Neurological: He is alert.  Skin: He is not diaphoretic.  Psychiatric: He has a normal mood and affect. His behavior is normal. Judgment and thought content normal.   Assessment/Plan:  Principal Problem:   Acute on chronic heart failure with preserved ejection fraction (HFpEF) (HCC) Active Problems:   Diabetes mellitus type 2 in obese ([place])   Obesity hypoventilation syndrome (HCC)   Acute on chronic renal failure (HCC)  Acute hypercarbic and hypoxic respiratory failure  Secondary to hfpef and oliguric renal failure. Patient got HD 2/6 via right IJ TDC. Has maturing right arm brachial artery to cephalic vein av fistula.   -continue bipap -pending CLIP -monitor for anemia of renal disease  -continue calcitriol   HfpEF TTE ef 60-65%, no lv hypertrophy, no regional wall motion abnormalities, ra mildly dilated    Hypothermia  Blood cultures negative for 3 days. No leukocytosis or subsequent  fevers or hypothermia.   HTN Blood pressure ranging 110-130/70-80s over the past 24 hrs.   -Getting hd  Dispo: Anticipated discharge in approximately 1-2 day(s).   Adam Mage, MD 07/03/2019, 11:41 AM

## 2019-07-03 NOTE — Progress Notes (Signed)
  Date: 07/03/2019  Patient name: Adam Dennis  Medical record number: [id number]  Date of birth: 10/20/1960   This patient's plan of care was discussed with the house staff. Please see Dr. Thea Gist note for complete details. I concur with her findings.   Sid Falcon, MD 07/03/2019, 8:03 PM

## 2019-07-03 NOTE — Progress Notes (Signed)
[facility] NEPHROLOGY PROGRESS NOTE  Assessment/ Plan: Pt is a 59 y.o. yo male with CHF, OSA, obesity, COPD, CKD 5 admitted with respiratory failure, fluid overload and progressive renal failure.  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  Started HD on 07/01/2019 after placement of right IJ TDC and right arm brachial artery to cephalic vein AV fistula.  Tolerated HD well. 3rd HD today, tolerating well. Will need CLIP.  #  HTN: BP improved after starting dialysis.  Continue to monitor.    # Anemia of CKD: Hemoglobin at goal, no ESA needed.  DC oral iron.  #Bone/ mineral: Phosphorus level expect to improve after HD.  EtOH 247, at goal for ESRD.  Currently on calcitriol.  # CHF: DC Lasix since he is now on HD.  # Acute hypoxic and hypercarbic RF: on BiPaP for now  # Dispo: pending, need CLIP.  Subjective: Seen and examined at dialysis unit.  Tolerating HD well.  Using BiPAP.  Denies nausea vomiting chest pain.  Some shortness of breath. Objective Vital signs in last 24 hours: Vitals:   07/03/19 0730 07/03/19 0800 07/03/19 0830 07/03/19 0900  BP: 138/80 (!) 155/85 124/77 125/71  Pulse: 67 92 65 65  Resp:      Temp:      TempSrc:      SpO2:      Weight:       Weight change: -3.3 kg  Intake/Output Summary (Last 24 hours) at 07/03/2019 0902 Last data filed at 07/03/2019 3419 Gross per 24 hour  Intake --  Output 3295 ml  Net -3295 ml       Labs: Basic Metabolic Panel: Recent Labs  Lab 07/01/19 0223 07/02/19 0512 07/03/19 0719  NA 141 140 137  K 5.1 4.8 3.7  CL 103 99 98  CO2 26 25 27   GLUCOSE 120* 83 101*  BUN 53* 45* 36*  CREATININE 7.51* 6.80* 5.57*  CALCIUM 8.1* 7.7* 7.5*  PHOS 8.8* 6.9* 5.3*   Liver Function Tests: Recent Labs  Lab 06/30/19 1952 06/30/19 1952 07/01/19 0223 07/02/19 0512 07/03/19 0719  AST 17  --   --   --   --   ALT 12  --   --   --   --   ALKPHOS 44  --   --   --   --   BILITOT 0.7  --   --   --   --   PROT  8.0  --   --   --   --   ALBUMIN 2.9*   < > 2.8* 2.6* 2.3*   < > = values in this interval not displayed.   No results for input(s): LIPASE, AMYLASE in the last 168 hours. No results for input(s): AMMONIA in the last 168 hours. CBC: Recent Labs  Lab 06/30/19 0624 06/30/19 0624 06/30/19 2055 06/30/19 2055 07/01/19 0223 07/02/19 0512 07/03/19 0719  WBC 5.9   < > 5.8   < > 6.2 7.7 5.5  HGB 13.5   < > 13.1   < > 13.1 12.3* 11.6*  HCT 44.5   < > 45.2   < > 44.2 40.7 39.2  MCV 92.7  --  95.2  --  94.8 93.1 93.3  PLT 184   < > 193   < > 199 162 159   < > = values in this interval not displayed.   Cardiac Enzymes: No results for input(s): CKTOTAL, CKMB, CKMBINDEX, TROPONINI in the last 168 hours.  CBG: Recent Labs  Lab 07/02/19 0559 07/02/19 0759 07/02/19 1623 07/02/19 2049 07/03/19 0626  GLUCAP 82 88 75 78 66*    Iron Studies: No results for input(s): IRON, TIBC, TRANSFERRIN, FERRITIN in the last 72 hours. Studies/Results: DG CHEST PORT 1 VIEW  Result Date: 07/01/2019 CLINICAL DATA:  Postoperative state. Insertion of tunneled dialysis catheter. EXAM: PORTABLE CHEST 1 VIEW COMPARISON:  06/30/2019 FINDINGS: Double lumen central venous catheter appears in good position in the superior vena cava at the level of the carina. The patient has chronic prominence of the right heart and pulmonary arteries as demonstrated on the prior CT scan of 09/12/2006. There is slight vascular congestion on the left. No pneumothorax. No acute bone abnormality. IMPRESSION: Central venous catheter in good position. No pneumothorax. Chronic prominence of the right heart and pulmonary arteries. Electronically Signed   By: Lorriane Shire M.D.   On: 07/01/2019 14:23   ECHOCARDIOGRAM COMPLETE  Result Date: 07/01/2019   ECHOCARDIOGRAM REPORT   Patient Name:   Adam Dennis Date of Exam: 07/01/2019 Medical Rec #:  [id number]       Height:       72.0 in Accession #:    [id number]      Weight:       357.1 lb Date of  Birth:  [birth year]        BSA:          2.73 m Patient Age:    69 years        BP:           116/64 mmHg Patient Gender: M               HR:           87 bpm. Exam Location:  Inpatient Procedure: 2D Echo, Color Doppler, Cardiac Doppler and Intracardiac            Opacification Agent STAT ECHO Indications:    J88.32 Acute diastolic (congestive) heart failure  History:        Patient has prior history of Echocardiogram examinations, most                 recent 04/16/2017. CHF, COPD; Risk Factors:Hypertension,                 Diabetes, Dyslipidemia and Sleep Apnea.  Sonographer:    [place] Referring Phys: [phone] [facility]  Sonographer Comments: Image acquisition challenging due to patient body habitus. IMPRESSIONS  1. Left ventricular ejection fraction, by visual estimation, is 60 to 65%. The left ventricle has normal function. There is no left ventricular hypertrophy.  2. Definity contrast agent was given IV to delineate the left ventricular endocardial borders.  3. The left ventricle has no regional wall motion abnormalities.  4. Abnormal septal motion.  5. Global right ventricle has normal systolic function.The right ventricular size is normal. No increase in right ventricular wall thickness.  6. Left atrial size was normal.  7. Right atrial size was mildly dilated.  8. The mitral valve is normal in structure. Trivial mitral valve regurgitation. No evidence of mitral stenosis.  9. The tricuspid valve is normal in structure. 10. The tricuspid valve is normal in structure. Tricuspid valve regurgitation is not demonstrated. 11. The aortic valve was not well visualized. Aortic valve regurgitation is not visualized. No evidence of aortic valve sclerosis or stenosis. 12. The pulmonic valve was not well visualized. Pulmonic valve regurgitation is not visualized. 13. The inferior vena  cava is normal in size with greater than 50% respiratory variability, suggesting right atrial pressure of 3 mmHg. 14.  The interatrial septum was not well visualized. FINDINGS  Left Ventricle: Left ventricular ejection fraction, by visual estimation, is 60 to 65%. The left ventricle has normal function. Definity contrast agent was given IV to delineate the left ventricular endocardial borders. The left ventricle has no regional wall motion abnormalities. There is no left ventricular hypertrophy. Left ventricular diastolic parameters were normal. Normal left atrial pressure. Abnormal septal motion. Right Ventricle: The right ventricular size is normal. No increase in right ventricular wall thickness. Global RV systolic function is has normal systolic function. Left Atrium: Left atrial size was normal in size. Right Atrium: Right atrial size was mildly dilated Pericardium: There is no evidence of pericardial effusion. Mitral Valve: The mitral valve is normal in structure. There is mild thickening of the mitral valve leaflet(s). Trivial mitral valve regurgitation. No evidence of mitral valve stenosis by observation. Tricuspid Valve: The tricuspid valve is normal in structure. Tricuspid valve regurgitation is not demonstrated. Aortic Valve: The aortic valve was not well visualized. Aortic valve regurgitation is not visualized. The aortic valve is structurally normal, with no evidence of sclerosis or stenosis. Pulmonic Valve: The pulmonic valve was not well visualized. Pulmonic valve regurgitation is not visualized. Pulmonic regurgitation is not visualized. Aorta: The aortic root, ascending aorta and aortic arch are all structurally normal, with no evidence of dilitation or obstruction. Venous: The inferior vena cava is normal in size with greater than 50% respiratory variability, suggesting right atrial pressure of 3 mmHg. IAS/Shunts: The interatrial septum was not well visualized. There is no evidence of a patent foramen ovale. No ventricular septal defect is seen or detected. There is no evidence of an atrial septal defect.  LEFT  VENTRICLE PLAX 2D LVOT diam:     2.00 cm  Diastology LVOT Area:     3.14 cm LV e' lateral:   9.90 cm/s                         LV E/e' lateral: 9.3                         LV e' medial:    7.29 cm/s                         LV E/e' medial:  12.6  RIGHT VENTRICLE RV S prime:     13.90 cm/s TAPSE (M-mode): 1.8 cm LEFT ATRIUM           Index       RIGHT ATRIUM           Index LA diam:      3.70 cm 1.36 cm/m  RA Area:     35.40 cm LA Vol (A4C): 81.2 ml 29.79 ml/m RA Volume:   158.00 ml 57.96 ml/m  AORTIC VALVE LVOT Vmax:   125.00 cm/s LVOT Vmean:  88.800 cm/s LVOT VTI:    0.263 m  AORTA Ao Root diam: 3.10 cm Ao Asc diam:  3.10 cm MITRAL VALVE MV Area (PHT): 3.99 cm             SHUNTS MV PHT:        55.10 msec           Systemic VTI:  0.26 m MV Decel Time: 190 msec  Systemic Diam: 2.00 cm MV E velocity: 92.20 cm/s 103 cm/s MV A velocity: 86.80 cm/s 70.3 cm/s MV E/A ratio:  1.06       1.5  Jenkins Rouge MD Electronically signed by Jenkins Rouge MD Signature Date/Time: 07/01/2019/9:11:18 AM    Final    HYBRID OR IMAGING ([place])  Result Date: 07/01/2019 There is no interpretation for this exam.  This order is for images obtained during a surgical procedure.  Please See "Surgeries" Tab for more information regarding the procedure.    Medications: Infusions: . sodium chloride    . sodium chloride      Scheduled Medications: . aspirin EC  81 mg Oral Daily  . atorvastatin  40 mg Oral q1800  . calcitRIOL  0.25 mcg Oral Daily  . calcium carbonate  1 tablet Oral BID  . Chlorhexidine Gluconate Cloth  6 each Topical Q0600  . Chlorhexidine Gluconate Cloth  6 each Topical Q0600  . cholecalciferol  2,000 Units Oral Daily  . ferrous sulfate  325 mg Oral Q breakfast  . heparin injection (subcutaneous)  5,000 Units Subcutaneous Q8H  . insulin aspart  0-5 Units Subcutaneous QHS  . insulin aspart  0-9 Units Subcutaneous TID WC  . latanoprost  1 drop Both Eyes QHS  . loratadine  10 mg Oral Daily  .  mometasone-formoterol  2 puff Inhalation BID  . sertraline  25 mg Oral Daily  . timolol  1 drop Both Eyes Daily    have reviewed scheduled and prn medications.  Physical Exam: General:NAD, comfortable, using BiPAP Heart:RRR, s1s2 nl Lungs: Coarse breath sound bilateral, Abdomen:soft, Non-tender, non-distended Extremities:No edema Dialysis Access: Right IJ TDC and right arm brachial artery to cephalic vein AV fistula site clean.  Some postsurgical erythema  Adam Dennis 07/03/2019,9:02 AM  LOS: 3 days  Pager: [phone]

## 2019-07-04 DIAGNOSIS — I5030 Unspecified diastolic (congestive) heart failure: Secondary | ICD-10-CM

## 2019-07-04 DIAGNOSIS — D638 Anemia in other chronic diseases classified elsewhere: Secondary | ICD-10-CM

## 2019-07-04 DIAGNOSIS — J9602 Acute respiratory failure with hypercapnia: Secondary | ICD-10-CM

## 2019-07-04 DIAGNOSIS — J9601 Acute respiratory failure with hypoxia: Principal | ICD-10-CM

## 2019-07-04 DIAGNOSIS — T68XXXA Hypothermia, initial encounter: Secondary | ICD-10-CM

## 2019-07-04 DIAGNOSIS — I130 Hypertensive heart and chronic kidney disease with heart failure and stage 1 through stage 4 chronic kidney disease, or unspecified chronic kidney disease: Secondary | ICD-10-CM

## 2019-07-04 DIAGNOSIS — N189 Chronic kidney disease, unspecified: Secondary | ICD-10-CM

## 2019-07-04 DIAGNOSIS — Z79899 Other long term (current) drug therapy: Secondary | ICD-10-CM

## 2019-07-04 LAB — RENAL FUNCTION PANEL
Albumin: 2.9 g/dL — ABNORMAL LOW (ref 3.5–5.0)
Anion gap: 15 (ref 5–15)
BUN: 35 mg/dL — ABNORMAL HIGH (ref 6–20)
CO2: 26 mmol/L (ref 22–32)
Calcium: 8.5 mg/dL — ABNORMAL LOW (ref 8.9–10.3)
Chloride: 95 mmol/L — ABNORMAL LOW (ref 98–111)
Creatinine, Ser: 5.52 mg/dL — ABNORMAL HIGH (ref 0.61–1.24)
GFR calc Af Amer: 12 mL/min — ABNORMAL LOW (ref >60)
GFR calc non Af Amer: 11 mL/min — ABNORMAL LOW (ref >60)
Glucose, Bld: 96 mg/dL (ref 70–99)
Phosphorus: 5 mg/dL — ABNORMAL HIGH (ref 2.5–4.6)
Potassium: 4.2 mmol/L (ref 3.5–5.1)
Sodium: 136 mmol/L (ref 135–145)

## 2019-07-04 LAB — GLUCOSE, CAPILLARY
Glucose-Capillary: 75 mg/dL (ref 70–99)
Glucose-Capillary: 80 mg/dL (ref 70–99)
Glucose-Capillary: 97 mg/dL (ref 70–99)
Glucose-Capillary: 96 mg/dL (ref 70–99)

## 2019-07-04 MED ORDER — SENNA 8.6 MG PO TABS
1.0000 | ORAL_TABLET | Freq: Every day | ORAL | Status: DC | PRN
  Administered 2019-07-06: 14:00:00 8.6 mg via ORAL
  Filled 2019-07-04: qty 1

## 2019-07-04 MED ORDER — SENNA 8.6 MG PO TABS
1.0000 | ORAL_TABLET | Freq: Once | ORAL | Status: AC
  Administered 2019-07-04: 8.6 mg via ORAL
  Filled 2019-07-04: qty 1

## 2019-07-04 MED ORDER — POLYETHYLENE GLYCOL 3350 17 G PO PACK
17.0000 g | PACK | Freq: Every day | ORAL | Status: DC
  Filled 2019-07-04 (×2): qty 1

## 2019-07-04 NOTE — Progress Notes (Signed)
  Date: 07/04/2019  Patient name: Adam Dennis  Medical record number: [id number]  Date of birth: 1961-05-10        I have seen and evaluated this patient and I have discussed the plan of care with the house staff. Please see Dr. Thea Gist note for complete details. I concur with her findings and plan.   Sid Falcon, MD 07/04/2019, 7:47 PM

## 2019-07-04 NOTE — Progress Notes (Signed)
   Subjective:  Patient reports that dialysis went well yesterday, cramping pain has improved. Patient denies difficulty breathing or chest pain. Patient reports that last bowel movement was 3-4 days ago. Discussed plan to setup outpatient dialysis.  Objective:  Vital signs in last 24 hours: Vitals:   07/03/19 2213 07/04/19 0000 07/04/19 0353 07/04/19 0400  BP:  132/72  134/77  Pulse: 66 65 64 65  Resp: (!) 26 20 (!) 22 (!) 21  Temp:  98.4 F (36.9 C)  98.3 F (36.8 C)  TempSrc:  Axillary  Axillary  SpO2: 99% 100% 99% 100%  Weight:    (!) 153.5 kg   Physical Exam  Constitutional: He is oriented to person, place, and time. He appears well-developed and well-nourished. No distress.  HENT:  Head: Normocephalic and atraumatic.  Cardiovascular: Normal rate, regular rhythm and normal heart sounds.  jvp approximately 3cm above sternal angle  Respiratory: Effort normal and breath sounds normal. No respiratory distress. He has no wheezes.  GI: Soft. Bowel sounds are normal. He exhibits no distension. There is no abdominal tenderness.  Musculoskeletal:        General: No edema. Normal range of motion.  Neurological: He is alert and oriented to person, place, and time.  Skin: He is not diaphoretic.  Psychiatric: He has a normal mood and affect. His behavior is normal. Judgment and thought content normal.   Assessment/Plan:  Principal Problem:   Acute on chronic heart failure with preserved ejection fraction (HFpEF) (HCC) Active Problems:   Diabetes mellitus type 2 in obese (HCC)   Obesity hypoventilation syndrome (HCC)   Acute on chronic renal failure (HCC)  Acute hypercarbic and hypoxic respiratory failure Secondary to hfpef and oliguric renal failure Access site: right IJ TDC, maturing right arm brachial artery to cephalic vein av fistula Last HD session: 2/6 Spoke with Dr. Carolin Sicks who mentioned that renal navigator will work on CLIP 2/8.  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 Stable. Not on epo or iron.   HfpEF TTE ef 60-65%, no lv hypertrophy, no regional wall motion abnormalities, ra mildly dilated    Hypothermia  Blood cultures negative for 3 days. No leukocytosis or subsequent fevers or hypothermia.   HTN Blood pressure ranging 110-150s/70-80s over the past 24 hrs. Controlled with HD.  Dispo: Anticipated discharge in approximately 1 day.   Lars Mage, MD 07/04/2019, 6:37 AM

## 2019-07-04 NOTE — Progress Notes (Signed)
[facility] NEPHROLOGY PROGRESS NOTE  Assessment/ Plan: Pt is a 59 y.o. yo male with CHF, OSA, obesity, COPD, CKD 5 admitted with respiratory failure, fluid overload and progressive renal failure.  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  Started HD on 07/01/2019 after placement of right IJ TDC and right arm brachial artery to cephalic vein AV fistula.  Tolerated HD well. 3rd HD on 2/6 with 3 L UF, tolerated well. Next HD on 2/9, will need CLIP.  #  HTN: BP improved after starting dialysis.  Continue to monitor.    # Anemia of CKD: Hemoglobin at goal, no ESA needed.  DC oral iron.  #Bone/ mineral: Phosphorus level improve after HD. PTH 247, at goal for ESRD.  Currently on calcitriol.  # CHF: DC Lasix since he is now on HD.  # Acute hypoxic and hypercarbic RF: improving.  # Dispo: pending, need CLIP.  Subjective: Seen and examined at bedside.  Tolerated dialysis well yesterday.  Denies nausea vomiting chest pain shortness of breath. Objective Vital signs in last 24 hours: Vitals:   07/04/19 0000 07/04/19 0353 07/04/19 0400 07/04/19 0859  BP: 132/72  134/77   Pulse: 65 64 65   Resp: 20 (!) 22 (!) 21   Temp: 98.4 F (36.9 C)  98.3 F (36.8 C)   TempSrc: Axillary  Axillary   SpO2: 100% 99% 100% 93%  Weight:   (!) 153.5 kg    Weight change: -7.3 kg  Intake/Output Summary (Last 24 hours) at 07/04/2019 0930 Last data filed at 07/04/2019 0400 Gross per 24 hour  Intake 360 ml  Output 3325 ml  Net -2965 ml       Labs: Basic Metabolic Panel: Recent Labs  Lab 07/01/19 0223 07/02/19 0512 07/03/19 0719  NA 141 140 137  K 5.1 4.8 3.7  CL 103 99 98  CO2 26 25 27   GLUCOSE 120* 83 101*  BUN 53* 45* 36*  CREATININE 7.51* 6.80* 5.57*  CALCIUM 8.1* 7.7* 7.5*  PHOS 8.8* 6.9* 5.3*   Liver Function Tests: Recent Labs  Lab 06/30/19 1952 06/30/19 1952 07/01/19 0223 07/02/19 0512 07/03/19 0719  AST 17  --   --   --   --   ALT 12  --   --   --   --    ALKPHOS 44  --   --   --   --   BILITOT 0.7  --   --   --   --   PROT 8.0  --   --   --   --   ALBUMIN 2.9*   < > 2.8* 2.6* 2.3*   < > = values in this interval not displayed.   No results for input(s): LIPASE, AMYLASE in the last 168 hours. No results for input(s): AMMONIA in the last 168 hours. CBC: Recent Labs  Lab 06/30/19 0624 06/30/19 0624 06/30/19 2055 06/30/19 2055 07/01/19 0223 07/02/19 0512 07/03/19 0719  WBC 5.9   < > 5.8   < > 6.2 7.7 5.5  HGB 13.5   < > 13.1   < > 13.1 12.3* 11.6*  HCT 44.5   < > 45.2   < > 44.2 40.7 39.2  MCV 92.7  --  95.2  --  94.8 93.1 93.3  PLT 184   < > 193   < > 199 162 159   < > = values in this interval not displayed.   Cardiac Enzymes: No results for input(s): CKTOTAL,  CKMB, CKMBINDEX, TROPONINI in the last 168 hours. CBG: Recent Labs  Lab 07/03/19 1126 07/03/19 1158 07/03/19 1612 07/03/19 2138 07/04/19 0414  GLUCAP 63* 134* 96 96 75    Iron Studies: No results for input(s): IRON, TIBC, TRANSFERRIN, FERRITIN in the last 72 hours. Studies/Results: No results found.  Medications: Infusions: . sodium chloride    . sodium chloride      Scheduled Medications: . aspirin EC  81 mg Oral Daily  . atorvastatin  40 mg Oral q1800  . calcitRIOL  0.25 mcg Oral Daily  . calcium carbonate  1 tablet Oral BID  . Chlorhexidine Gluconate Cloth  6 each Topical Q0600  . Chlorhexidine Gluconate Cloth  6 each Topical Q0600  . heparin injection (subcutaneous)  5,000 Units Subcutaneous Q8H  . insulin aspart  0-5 Units Subcutaneous QHS  . insulin aspart  0-9 Units Subcutaneous TID WC  . latanoprost  1 drop Both Eyes QHS  . loratadine  10 mg Oral Daily  . mometasone-formoterol  2 puff Inhalation BID  . sertraline  25 mg Oral Daily  . timolol  1 drop Both Eyes Daily    have reviewed scheduled and prn medications.  Physical Exam: General: Not in distress, lying on bed comfortable Heart:RRR, s1s2 nl Lungs: Clear bilateral, no  wheezing Abdomen:soft, Non-tender, non-distended Extremities:No edema Dialysis Access: Right IJ TDC and right arm brachial artery to cephalic vein AV fistula site clean.  Some postsurgical erythema  Avina Eberle Tanna Furry 07/04/2019,9:30 AM  LOS: 4 days  Pager: [phone]

## 2019-07-04 NOTE — Progress Notes (Signed)
Late entry Ref: Charlott Rakes, MD   Subjective:  Awake. On BiPAP. Improving O2 saturation. Had dialysis with 3L ultrafiltration in AM.   Objective:  Vital Signs in the last 24 hours: Temp:  [97.6 F (36.4 C)-99.3 F (37.4 C)] 98.3 F (36.8 C) (02/07 0956) Pulse Rate:  [63-90] 71 (02/07 0956) Cardiac Rhythm: Normal sinus rhythm (02/07 0700) Resp:  [13-27] 27 (02/07 0956) BP: (123-164)/(70-86) 164/86 (02/07 0956) SpO2:  [93 %-100 %] 98 % (02/07 0956) FiO2 (%):  [50 %] 50 % (02/06 1123) Weight:  [152.7 kg-153.5 kg] 153.5 kg (02/07 0400)  Physical Exam: BP Readings from Last 1 Encounters:  07/04/19 (!) 164/86     Wt Readings from Last 1 Encounters:  07/04/19 (!) 153.5 kg    Weight change: -7.3 kg Body mass index is 45.9 kg/m. HEENT: [place]/AT, Eyes-Brown, Conjunctiva-Pink, Sclera-Non-icteric Neck: No JVD, No bruit, Trachea midline. Lungs:  Clearing, Bilateral. Cardiac:  Regular rhythm, normal S1 and S2, no S3. II/VI systolic murmur. Abdomen:  Soft, non-tender. BS present. Extremities:  1 + edema present. No cyanosis. No clubbing. Right IJ TDC. CNS: AxOx3, Cranial nerves grossly intact, moves all 4 extremities.  Skin: Warm and dry.   Intake/Output from previous day: 02/06 0701 - 02/07 0700 In: 360 [P.O.:360] Out: 3325 [Urine:425]    Lab Results: BMET    Component Value Date/Time   NA 137 07/03/2019 0719   NA 140 07/02/2019 0512   NA 141 07/01/2019 0223   NA 141 03/24/2019 1017   NA 142 07/01/2018 1114   NA 145 (H) 03/04/2018 1131   K 3.7 07/03/2019 0719   K 4.8 07/02/2019 0512   K 5.1 07/01/2019 0223   CL 98 07/03/2019 0719   CL 99 07/02/2019 0512   CL 103 07/01/2019 0223   CO2 27 07/03/2019 0719   CO2 25 07/02/2019 0512   CO2 26 07/01/2019 0223   GLUCOSE 101 (H) 07/03/2019 0719   GLUCOSE 83 07/02/2019 0512   GLUCOSE 120 (H) 07/01/2019 0223   BUN 36 (H) 07/03/2019 0719   BUN 45 (H) 07/02/2019 0512   BUN 53 (H) 07/01/2019 0223   BUN 36 (H) 03/24/2019  1017   BUN 32 (H) 07/01/2018 1114   BUN 38 (H) 03/04/2018 1131   CREATININE 5.57 (H) 07/03/2019 0719   CREATININE 6.80 (H) 07/02/2019 0512   CREATININE 7.51 (H) 07/01/2019 0223   CREATININE 2.89 (H) 11/30/2015 1250   CREATININE 3.06 (H) 09/28/2015 1746   CREATININE 2.29 (H) 06/26/2015 1047   CALCIUM 7.5 (L) 07/03/2019 0719   CALCIUM 7.7 (L) 07/02/2019 0512   CALCIUM 8.1 (L) 07/01/2019 0223   GFRNONAA 10 (L) 07/03/2019 0719   GFRNONAA 8 (L) 07/02/2019 0512   GFRNONAA 7 (L) 07/01/2019 0223   GFRNONAA 24 (L) 11/30/2015 1250   GFRNONAA 22 (L) 09/28/2015 1746   GFRNONAA 59 (L) 12/12/2014 1009   GFRAA 12 (L) 07/03/2019 0719   GFRAA 9 (L) 07/02/2019 0512   GFRAA 8 (L) 07/01/2019 0223   GFRAA 27 (L) 11/30/2015 1250   GFRAA 26 (L) 09/28/2015 1746   GFRAA 68 12/12/2014 1009   CBC    Component Value Date/Time   WBC 5.5 07/03/2019 0719   RBC 4.20 (L) 07/03/2019 0719   HGB 11.6 (L) 07/03/2019 0719   HGB 12.8 (L) 03/24/2019 1017   HCT 39.2 07/03/2019 0719   HCT 39.7 03/24/2019 1017   PLT 159 07/03/2019 0719   PLT 191 03/24/2019 1017   MCV 93.3 07/03/2019 0719  MCV 87 03/24/2019 1017   MCH 27.6 07/03/2019 0719   MCHC 29.6 (L) 07/03/2019 0719   RDW 16.7 (H) 07/03/2019 0719   RDW 13.9 03/24/2019 1017   LYMPHSABS 1.3 03/24/2019 1017   MONOABS 0.8 04/28/2017 1236   EOSABS 0.0 03/24/2019 1017   BASOSABS 0.0 03/24/2019 1017   HEPATIC Function Panel Recent Labs    03/24/19 1017 06/30/19 1952  PROT 6.9 8.0   HEMOGLOBIN A1C No components found for: HGA1C,  MPG CARDIAC ENZYMES Lab Results  Component Value Date   TROPONINI <0.03 02/06/2017   TROPONINI <0.03 02/06/2017   TROPONINI <0.03 02/06/2017   BNP No results for input(s): PROBNP in the last 8760 hours. TSH No results for input(s): TSH in the last 8760 hours. CHOLESTEROL Recent Labs    03/24/19 1017  CHOL 119    Scheduled Meds: . aspirin EC  81 mg Oral Daily  . atorvastatin  40 mg Oral q1800  . calcitRIOL  0.25  mcg Oral Daily  . calcium carbonate  1 tablet Oral BID  . Chlorhexidine Gluconate Cloth  6 each Topical Q0600  . Chlorhexidine Gluconate Cloth  6 each Topical Q0600  . heparin injection (subcutaneous)  5,000 Units Subcutaneous Q8H  . insulin aspart  0-5 Units Subcutaneous QHS  . insulin aspart  0-9 Units Subcutaneous TID WC  . latanoprost  1 drop Both Eyes QHS  . loratadine  10 mg Oral Daily  . mometasone-formoterol  2 puff Inhalation BID  . sertraline  25 mg Oral Daily  . timolol  1 drop Both Eyes Daily   Continuous Infusions: . sodium chloride    . sodium chloride     PRN Meds:.sodium chloride, sodium chloride, acetaminophen **OR** acetaminophen, albuterol, alteplase, heparin, heparin, lidocaine (PF), lidocaine-prilocaine, ondansetron **OR** ondansetron (ZOFRAN) IV, pentafluoroprop-tetrafluoroeth  Assessment/Plan: Acute diastolic left heart failure, HFpEF HTN Type 2 DM ESRD COPD Morbid obesity OSA  Continue current treatment Discussed with patient on limiting salt and fluid intake.    LOS: 4 days   Time spent including chart review, lab review, examination, discussion with patient, fiancee and nurse : 30 min   Dixie Dials  MD  07/04/2019, 10:43 AM

## 2019-07-04 NOTE — Progress Notes (Signed)
Ref: Charlott Rakes, MD   Subjective:  Feeling better. Now on oxygen by [place]. Afebrile. Mild respiratory distress at rest continues.  Objective:  Vital Signs in the last 24 hours: Temp:  [97.6 F (36.4 C)-98.4 F (36.9 C)] 98 F (36.7 C) (02/07 1123) Pulse Rate:  [63-71] 71 (02/07 1123) Cardiac Rhythm: Normal sinus rhythm (02/07 0700) Resp:  [20-27] 26 (02/07 1123) BP: (123-164)/(70-86) 141/73 (02/07 1123) SpO2:  [93 %-100 %] 96 % (02/07 1123) Weight:  [153.5 kg] 153.5 kg (02/07 0400)  Physical Exam: BP Readings from Last 1 Encounters:  07/04/19 (!) 141/73     Wt Readings from Last 1 Encounters:  07/04/19 (!) 153.5 kg    Weight change: -7.3 kg Body mass index is 45.9 kg/m. HEENT: [place]/AT, Eyes-Brown, Conjunctiva-Pink, Sclera-Non-icteric Neck: No JVD, No bruit, Trachea midline. Lungs:  Clearing, Bilateral. Cardiac:  Regular rhythm, normal S1 and S2, no S3. II/VI systolic murmur. Abdomen:  Soft, non-tender. BS present. Extremities:  1 + edema present. No cyanosis. No clubbing. Right IJ TDC. CNS: AxOx3, Cranial nerves grossly intact, moves all 4 extremities.  Skin: Warm and dry.   Intake/Output from previous day: 02/06 0701 - 02/07 0700 In: 360 [P.O.:360] Out: 3325 [Urine:425]    Lab Results: BMET    Component Value Date/Time   NA 137 07/03/2019 0719   NA 140 07/02/2019 0512   NA 141 07/01/2019 0223   NA 141 03/24/2019 1017   NA 142 07/01/2018 1114   NA 145 (H) 03/04/2018 1131   K 3.7 07/03/2019 0719   K 4.8 07/02/2019 0512   K 5.1 07/01/2019 0223   CL 98 07/03/2019 0719   CL 99 07/02/2019 0512   CL 103 07/01/2019 0223   CO2 27 07/03/2019 0719   CO2 25 07/02/2019 0512   CO2 26 07/01/2019 0223   GLUCOSE 101 (H) 07/03/2019 0719   GLUCOSE 83 07/02/2019 0512   GLUCOSE 120 (H) 07/01/2019 0223   BUN 36 (H) 07/03/2019 0719   BUN 45 (H) 07/02/2019 0512   BUN 53 (H) 07/01/2019 0223   BUN 36 (H) 03/24/2019 1017   BUN 32 (H) 07/01/2018 1114   BUN 38 (H)  03/04/2018 1131   CREATININE 5.57 (H) 07/03/2019 0719   CREATININE 6.80 (H) 07/02/2019 0512   CREATININE 7.51 (H) 07/01/2019 0223   CREATININE 2.89 (H) 11/30/2015 1250   CREATININE 3.06 (H) 09/28/2015 1746   CREATININE 2.29 (H) 06/26/2015 1047   CALCIUM 7.5 (L) 07/03/2019 0719   CALCIUM 7.7 (L) 07/02/2019 0512   CALCIUM 8.1 (L) 07/01/2019 0223   GFRNONAA 10 (L) 07/03/2019 0719   GFRNONAA 8 (L) 07/02/2019 0512   GFRNONAA 7 (L) 07/01/2019 0223   GFRNONAA 24 (L) 11/30/2015 1250   GFRNONAA 22 (L) 09/28/2015 1746   GFRNONAA 59 (L) 12/12/2014 1009   GFRAA 12 (L) 07/03/2019 0719   GFRAA 9 (L) 07/02/2019 0512   GFRAA 8 (L) 07/01/2019 0223   GFRAA 27 (L) 11/30/2015 1250   GFRAA 26 (L) 09/28/2015 1746   GFRAA 68 12/12/2014 1009   CBC    Component Value Date/Time   WBC 5.5 07/03/2019 0719   RBC 4.20 (L) 07/03/2019 0719   HGB 11.6 (L) 07/03/2019 0719   HGB 12.8 (L) 03/24/2019 1017   HCT 39.2 07/03/2019 0719   HCT 39.7 03/24/2019 1017   PLT 159 07/03/2019 0719   PLT 191 03/24/2019 1017   MCV 93.3 07/03/2019 0719   MCV 87 03/24/2019 1017   MCH 27.6 07/03/2019 0719  MCHC 29.6 (L) 07/03/2019 0719   RDW 16.7 (H) 07/03/2019 0719   RDW 13.9 03/24/2019 1017   LYMPHSABS 1.3 03/24/2019 1017   MONOABS 0.8 04/28/2017 1236   EOSABS 0.0 03/24/2019 1017   BASOSABS 0.0 03/24/2019 1017   HEPATIC Function Panel Recent Labs    03/24/19 1017 06/30/19 1952  PROT 6.9 8.0   HEMOGLOBIN A1C No components found for: HGA1C,  MPG CARDIAC ENZYMES Lab Results  Component Value Date   TROPONINI <0.03 02/06/2017   TROPONINI <0.03 02/06/2017   TROPONINI <0.03 02/06/2017   BNP No results for input(s): PROBNP in the last 8760 hours. TSH No results for input(s): TSH in the last 8760 hours. CHOLESTEROL Recent Labs    03/24/19 1017  CHOL 119    Scheduled Meds: . aspirin EC  81 mg Oral Daily  . atorvastatin  40 mg Oral q1800  . calcitRIOL  0.25 mcg Oral Daily  . calcium carbonate  1 tablet  Oral BID  . Chlorhexidine Gluconate Cloth  6 each Topical Q0600  . Chlorhexidine Gluconate Cloth  6 each Topical Q0600  . heparin injection (subcutaneous)  5,000 Units Subcutaneous Q8H  . insulin aspart  0-5 Units Subcutaneous QHS  . insulin aspart  0-9 Units Subcutaneous TID WC  . latanoprost  1 drop Both Eyes QHS  . loratadine  10 mg Oral Daily  . mometasone-formoterol  2 puff Inhalation BID  . polyethylene glycol  17 g Oral Daily  . senna  1 tablet Oral Once  . sertraline  25 mg Oral Daily  . timolol  1 drop Both Eyes Daily   Continuous Infusions: . sodium chloride    . sodium chloride     PRN Meds:.sodium chloride, sodium chloride, acetaminophen **OR** acetaminophen, albuterol, alteplase, heparin, heparin, lidocaine (PF), lidocaine-prilocaine, ondansetron **OR** ondansetron (ZOFRAN) IV, pentafluoroprop-tetrafluoroeth, senna  Assessment/Plan: Acute diastolic left heart failure, HFpEF HTN ESRD Type 2 DM COPD Morbid obesity OSA  Continue medical treatment.   LOS: 4 days   Time spent including chart review, lab review, examination, discussion with patient : 30 min   Dixie Dials  MD  07/04/2019, 12:05 PM

## 2019-07-05 DIAGNOSIS — Z9989 Dependence on other enabling machines and devices: Secondary | ICD-10-CM

## 2019-07-05 LAB — GLUCOSE, CAPILLARY
Glucose-Capillary: 75 mg/dL (ref 70–99)
Glucose-Capillary: 86 mg/dL (ref 70–99)
Glucose-Capillary: 102 mg/dL — ABNORMAL HIGH (ref 70–99)
Glucose-Capillary: 87 mg/dL (ref 70–99)
Glucose-Capillary: 72 mg/dL (ref 70–99)

## 2019-07-05 MED ORDER — RENA-VITE PO TABS
1.0000 | ORAL_TABLET | Freq: Every day | ORAL | Status: DC
  Administered 2019-07-05 – 2019-07-06 (×2): 1 via ORAL
  Filled 2019-07-05 (×2): qty 1

## 2019-07-05 NOTE — Progress Notes (Signed)
RT placed patient on BIPAP for tonight. Patient tolerating well at this time. RT will monitor as needed.

## 2019-07-05 NOTE — Progress Notes (Signed)
Subjective:  Patient denies any chest pain or shortness of breath up in chair eating breakfast.  He feels better after hemodialysis.  Objective:  Vital Signs in the last 24 hours: Temp:  [97.9 F (36.6 C)-98.3 F (36.8 C)] 98.1 F (36.7 C) (02/08 0915) Pulse Rate:  [66-85] 77 (02/08 0915) Resp:  [21-31] 24 (02/08 0915) BP: (125-164)/(55-86) 127/72 (02/08 0915) SpO2:  [88 %-100 %] 95 % (02/08 0915) Weight:  [150.7 kg] 150.7 kg (02/08 0517)  Intake/Output from previous day: 02/07 0701 - 02/08 0700 In: 990 [P.O.:990] Out: 380 [Urine:380] Intake/Output from this shift: No intake/output data recorded.  Physical Exam: Neck: no adenopathy, no carotid bruit, no JVD and supple, symmetrical, trachea midline Lungs: clear to auscultation bilaterally Heart: regular rate and rhythm, S1, S2 normal and no pericardial rub Abdomen: soft, non-tender; bowel sounds normal; no masses,  no organomegaly Extremities: extremities normal, atraumatic, no cyanosis or edema  Lab Results: Recent Labs    07/03/19 0719  WBC 5.5  HGB 11.6*  PLT 159   Recent Labs    07/03/19 0719 07/04/19 1823  NA 137 136  K 3.7 4.2  CL 98 95*  CO2 27 26  GLUCOSE 101* 96  BUN 36* 35*  CREATININE 5.57* 5.52*   No results for input(s): TROPONINI in the last 72 hours.  Invalid input(s): CK, MB Hepatic Function Panel Recent Labs    07/04/19 1823  ALBUMIN 2.9*   No results for input(s): CHOL in the last 72 hours. No results for input(s): PROTIME in the last 72 hours.  Imaging: Imaging results have been reviewed and No results found.  Cardiac Studies:  Assessment/Plan:  Resolving Decompensated congestive heart failure secondary to preserved LV systolic function . Secondary to increased fluid intake/worsening renal function Status post hypotensive shock probably secondary to medications Hypertension. Diabetes mellitus. Acute on chronic kidney disease stage IV.now on hemodialysis Probably component of  cardiorenal syndrome COPD Morbid obesity. Obstructive sleep apnea/obesity hypoventilation syndrome on CPAP. Plan Continue present management  LOS: 5 days    Charolette Forward 07/05/2019, 9:17 AM

## 2019-07-05 NOTE — Progress Notes (Signed)
At night SPO2 dropped to 86- 88% when he's sleeping. Titrated by increasing flow O2 NCL from 2 to 4 lpm. SPO2 increased to 100%, RR 25-31. Short and shallow breathing pattern and SOB with exertion. Pt tolerated well when ambulated short distance in room. No coughing, auscultated lungs clear, but diminished basilar bilaterally. Encouraged to do deep breathing exercise. Pt able to follow instructions effectively. Will continue to monitor.  Kennyth Lose, RN

## 2019-07-05 NOTE — Care Management Important Message (Signed)
Important Message  Patient Details  Name: TYLERJAMES HOGLUND MRN: [id number] Date of Birth: 1960-09-01   Medicare Important Message Given:  Yes     Shelda Altes 07/05/2019, 3:54 PM

## 2019-07-05 NOTE — Progress Notes (Signed)
Plan of care reviewed. Pt is progressing. His vital signs stable. Refused BiPap at night. On 2LPM of NCL,SPO2 92-100%. No immediate distress. Will continue to monitor.  Kennyth Lose, RN

## 2019-07-05 NOTE — Progress Notes (Signed)
Subjective: HD#5 Events Overnight: no events overnight.  Patient was seen this morning on rounds.  He is resting comfortably in his recliner.  He states that his breathing has improved.  Patient denies new complaints at this time.  Objective:  Vital signs in last 24 hours: Vitals:   07/05/19 0200 07/05/19 0400 07/05/19 0500 07/05/19 0517  BP:    (!) 130/55  Pulse: 84 84 66 85  Resp: (!) 23 (!) 31 (!) 25 (!) 26  Temp:    97.9 F (36.6 C)  TempSrc:    Oral  SpO2: 94% 91% (!) 88% 91%  Weight:        Physical Exam: Physical Exam  Constitutional: He is oriented to person, place, and time and well-developed, well-nourished, and in no distress.  HENT:  Head: Normocephalic and atraumatic.  Eyes: EOM are normal.  Cardiovascular: Normal rate, regular rhythm, normal heart sounds and intact distal pulses.  Pulmonary/Chest: Breath sounds normal. No respiratory distress. He exhibits no tenderness.  Abdominal: Soft.  Musculoskeletal:        General: Edema present. No tenderness. Normal range of motion.     Cervical back: Normal range of motion.  Neurological: He is alert and oriented to person, place, and time.  Skin: Skin is warm and dry.    Filed Weights   07/03/19 0657 07/03/19 1044 07/04/19 0400  Weight: (!) 155.8 kg (!) 152.7 kg (!) 153.5 kg     Intake/Output Summary (Last 24 hours) at 07/05/2019 0538 Last data filed at 07/04/2019 1939 Gross per 24 hour  Intake 690 ml  Output 380 ml  Net 310 ml    Pertinent labs/Imaging: CBC Latest Ref Rng & Units 07/03/2019 07/02/2019 07/01/2019  WBC 4.0 - 10.5 K/uL 5.5 7.7 6.2  Hemoglobin 13.0 - 17.0 g/dL 11.6(L) 12.3(L) 13.1  Hematocrit 39.0 - 52.0 % 39.2 40.7 44.2  Platelets 150 - 400 K/uL 159 162 199    CMP Latest Ref Rng & Units 07/04/2019 07/03/2019 07/02/2019  Glucose 70 - 99 mg/dL 96 101(H) 83  BUN 6 - 20 mg/dL 35(H) 36(H) 45(H)  Creatinine 0.61 - 1.24 mg/dL 5.52(H) 5.57(H) 6.80(H)  Sodium 135 - 145 mmol/L 136 137 140  Potassium 3.5  - 5.1 mmol/L 4.2 3.7 4.8  Chloride 98 - 111 mmol/L 95(L) 98 99  CO2 22 - 32 mmol/L 26 27 25   Calcium 8.9 - 10.3 mg/dL 8.5(L) 7.5(L) 7.7(L)  Total Protein 6.5 - 8.1 g/dL - - -  Total Bilirubin 0.3 - 1.2 mg/dL - - -  Alkaline Phos 38 - 126 U/L - - -  AST 15 - 41 U/L - - -  ALT 0 - 44 U/L - - -    No results found.  Assessment/Plan:  Principal Problem:   Acute on chronic heart failure with preserved ejection fraction (HFpEF) (HCC) Active Problems:   Diabetes mellitus type 2 in obese ([place])   Obesity hypoventilation syndrome (HCC)   Acute on chronic renal failure [facility])    Patient Summary: Adam Dennis is a 59 y.o. with pertinent PMH of HFrEF (LVEF now recovered), hypertension, OSA/OHS, and ESRD, who presented with dyspnea on exertion and was admit for ESRD on hospital day 5  #AcuteHypercarbic/Hypoxic Respiratory Failure #ESRD: Patient continues to improve.  States that his breathing is significantly better.  He no longer needs to use the BiPAP overnight.  He is using CPAP for OHS. -Continue HD per nephrology - CLIP pending  - Appreciate VVS and nephrologies assistance.  #  Obesity Hypoventilation Syndrome Patient using CPAP at night.  He states that he is tolerating it well. -Continue CPAP  #HTN:  Patient's hypertension is well controlled this time.  This is secondary to his hemodialysis.  Diet: Renal IVF: None VTE: Heparin Code: Full  Dispo: Anticipated discharge pending clip placement.    Marianna Payment, D.O. [facility], PGY-1 Date 07/05/2019 Time 5:38 AM Pager: [phone]

## 2019-07-05 NOTE — Progress Notes (Signed)
Patient ID: Adam Dennis, male   DOB: [birth year], 59 y.o.   MRN: [id number]  [facility] Progress Note   Assessment/ Plan:   1.  Volume overload/pulmonary edema: Secondary to progressive chronic kidney disease with diuretic refractoriness.  Started on hemodialysis with symptomatic improvement. 2. ESRD: With progressive chronic kidney disease and started on hemodialysis on 07/01/2019 via right IJ TDC.  Appears to be tolerating dialysis well and is also now status post right BCF.  Outpatient unit placement pending.  Will order for hemodialysis again tomorrow. 3. Anemia: Without overt blood loss, hemoglobin/hematocrit at goal. 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 5. Nutrition: Continue renal diet, started on renal multivitamin. 6. Hypertension: Blood pressure under acceptable control status post hemodialysis.  Subjective:   Reports to be feeling fair and states that breathing is much better.   Objective:   BP 127/72 (BP Location: Left Arm)   Pulse 77   Temp 98.1 F (36.7 C) (Oral)   Resp (!) 24   Wt (!) 150.7 kg   SpO2 95%   BMI 45.05 kg/m   Physical Exam: Gen: Comfortably sitting up in recliner, watching television CVS: Pulse regular rhythm, normal rate, S1 and S2 normal Resp: Distant breath sounds bilaterally, no distinct rales or rhonchi Abd: Soft, obese, nontender Ext: 2+ lower extremity edema with right IJ TDC and palpable right BCF that is pulsatile  Labs: BMET Recent Labs  Lab 06/30/19 0624 07/01/19 0223 07/02/19 0512 07/03/19 0719 07/04/19 1823  NA 139 141 140 137 136  K 3.9 5.1 4.8 3.7 4.2  CL 100 103 99 98 95*  CO2 24 26 25 27 26   GLUCOSE 106* 120* 83 101* 96  BUN 44* 53* 45* 36* 35*  CREATININE 6.80* 7.51* 6.80* 5.57* 5.52*  CALCIUM 8.1* 8.1* 7.7* 7.5* 8.5*  PHOS  --  8.8* 6.9* 5.3* 5.0*   CBC Recent Labs  Lab 06/30/19 2055 07/01/19 0223 07/02/19 0512 07/03/19 0719  WBC 5.8 6.2 7.7 5.5  HGB 13.1  13.1 12.3* 11.6*  HCT 45.2 44.2 40.7 39.2  MCV 95.2 94.8 93.1 93.3  PLT 193 199 162 159     Medications:    . aspirin EC  81 mg Oral Daily  . atorvastatin  40 mg Oral q1800  . calcitRIOL  0.25 mcg Oral Daily  . calcium carbonate  1 tablet Oral BID  . Chlorhexidine Gluconate Cloth  6 each Topical Q0600  . Chlorhexidine Gluconate Cloth  6 each Topical Q0600  . heparin injection (subcutaneous)  5,000 Units Subcutaneous Q8H  . insulin aspart  0-5 Units Subcutaneous QHS  . insulin aspart  0-9 Units Subcutaneous TID WC  . latanoprost  1 drop Both Eyes QHS  . loratadine  10 mg Oral Daily  . mometasone-formoterol  2 puff Inhalation BID  . polyethylene glycol  17 g Oral Daily  . sertraline  25 mg Oral Daily  . timolol  1 drop Both Eyes Daily   Elmarie Shiley, MD 07/05/2019, 11:14 AM

## 2019-07-06 ENCOUNTER — Inpatient Hospital Stay (HOSPITAL_COMMUNITY): Admission: RE | Admit: 2019-07-06 | Payer: Medicare HMO | Source: Ambulatory Visit

## 2019-07-06 LAB — RENAL FUNCTION PANEL
Albumin: 2.6 g/dL — ABNORMAL LOW (ref 3.5–5.0)
Anion gap: 14 (ref 5–15)
BUN: 57 mg/dL — ABNORMAL HIGH (ref 6–20)
CO2: 28 mmol/L (ref 22–32)
Calcium: 8.6 mg/dL — ABNORMAL LOW (ref 8.9–10.3)
Chloride: 94 mmol/L — ABNORMAL LOW (ref 98–111)
Creatinine, Ser: 7.58 mg/dL — ABNORMAL HIGH (ref 0.61–1.24)
GFR calc Af Amer: 8 mL/min — ABNORMAL LOW (ref >60)
GFR calc non Af Amer: 7 mL/min — ABNORMAL LOW (ref >60)
Glucose, Bld: 104 mg/dL — ABNORMAL HIGH (ref 70–99)
Phosphorus: 7.2 mg/dL — ABNORMAL HIGH (ref 2.5–4.6)
Potassium: 3.7 mmol/L (ref 3.5–5.1)
Sodium: 136 mmol/L (ref 135–145)

## 2019-07-06 LAB — CBC
HCT: 43.9 % (ref 39.0–52.0)
Hemoglobin: 13.6 g/dL (ref 13.0–17.0)
MCH: 28.3 pg (ref 26.0–34.0)
MCHC: 31 g/dL (ref 30.0–36.0)
MCV: 91.3 fL (ref 80.0–100.0)
Platelets: 186 10*3/uL (ref 150–400)
RBC: 4.81 MIL/uL (ref 4.22–5.81)
RDW: 16.5 % — ABNORMAL HIGH (ref 11.5–15.5)
WBC: 8.9 10*3/uL (ref 4.0–10.5)
nRBC: 0.2 % (ref 0.0–0.2)

## 2019-07-06 LAB — HEPATITIS B SURFACE ANTIGEN: Hepatitis B Surface Ag: NONREACTIVE

## 2019-07-06 LAB — GLUCOSE, CAPILLARY
Glucose-Capillary: 103 mg/dL — ABNORMAL HIGH (ref 70–99)
Glucose-Capillary: 116 mg/dL — ABNORMAL HIGH (ref 70–99)

## 2019-07-06 LAB — HEPATITIS B SURFACE ANTIBODY,QUALITATIVE: Hep B S Ab: NONREACTIVE

## 2019-07-06 MED ORDER — HEPARIN SODIUM (PORCINE) 1000 UNIT/ML DIALYSIS: 40.0000 [IU]/kg | INTRAMUSCULAR | Status: DC | PRN

## 2019-07-06 MED ORDER — CALCITRIOL 0.25 MCG PO CAPS
ORAL_CAPSULE | ORAL | Status: AC
  Filled 2019-07-06: qty 1

## 2019-07-06 MED ORDER — HEPARIN SODIUM (PORCINE) 1000 UNIT/ML IJ SOLN
INTRAMUSCULAR | Status: AC
  Filled 2019-07-06: qty 3

## 2019-07-06 NOTE — Progress Notes (Signed)
Care plan reviewed. Pt is progressing.  He had been using 1-2 LPM of O2 NCL all day yesterday per RN day shift reported without any distress.  Tonight SPO2 95-96%,  RR 22-30, agreed on BiPAP. Remained afebrile. Temp 97.6- 97.9 F, Sinus rhythm on monitor, HR  70s,  BP 119/ 66 - 144/ 71 mmHg,   No incidents of immediate distress tonight.. Will continue to monitor.  Kennyth Lose, RN

## 2019-07-06 NOTE — Progress Notes (Signed)
Subjective:  Patient denies any chest pain or shortness of breath tolerating hemodialysis.  No active cardiac issues at present.  Objective:  Vital Signs in the last 24 hours: Temp:  [97.6 F (36.4 C)-98.3 F (36.8 C)] 97.7 F (36.5 C) (02/09 0733) Pulse Rate:  [73-91] 73 (02/09 0830) Resp:  [15-31] 15 (02/09 0733) BP: (119-164)/(66-83) 131/71 (02/09 0830) SpO2:  [82 %-98 %] 96 % (02/09 0733) Weight:  [151 kg-153.7 kg] 151 kg (02/09 0733)  Intake/Output from previous day: 02/08 0701 - 02/09 0700 In: 500 [P.O.:500] Out: 775 [Urine:775] Intake/Output from this shift: No intake/output data recorded.  Physical Exam: Neck: no adenopathy, no carotid bruit, no JVD and supple, symmetrical, trachea midline Lungs: Clear to auscultation anterolaterally Heart: regular rate and rhythm, S1, S2 normal and Soft systolic murmur noted Abdomen: soft, non-tender; bowel sounds normal; no masses,  no organomegaly Extremities: extremities normal, atraumatic, no cyanosis or edema  Lab Results: Recent Labs    07/06/19 0754  WBC 8.9  HGB 13.6  PLT 186   Recent Labs    07/04/19 1823 07/06/19 0754  NA 136 136  K 4.2 3.7  CL 95* 94*  CO2 26 28  GLUCOSE 96 104*  BUN 35* 57*  CREATININE 5.52* 7.58*   No results for input(s): TROPONINI in the last 72 hours.  Invalid input(s): CK, MB Hepatic Function Panel Recent Labs    07/06/19 0754  ALBUMIN 2.6*   No results for input(s): CHOL in the last 72 hours. No results for input(s): PROTIME in the last 72 hours.  Imaging: Imaging results have been reviewed and No results found.  Cardiac Studies:  Assessment/Plan:  Compensated congestive heart failure secondary to preserved LV systolic function . Secondary to increased fluid intake/worsening renal function Status post hypotensive shock probably secondary to medications Hypertension. Diabetes mellitus. Acute on chronic kidney disease stage IV.now on hemodialysis Probably component of  cardiorenal syndrome COPD Morbid obesity. Obstructive sleep apnea/obesity hypoventilation syndrome on CPAP. Plan Continue present management I will sign off please call if needed  LOS: 6 days    Charolette Forward 07/06/2019, 9:21 AM

## 2019-07-06 NOTE — Procedures (Signed)
Patient seen on Hemodialysis. BP (!) 114/57   Pulse 81   Temp 97.7 F (36.5 C) (Oral)   Resp 15   Wt (!) 151 kg   SpO2 96%   BMI 45.15 kg/m   QB 400, UF goal 3.5L Tolerating treatment without complaints at this time.   Elmarie Shiley MD [facility] Office # [phone] Pager # [phone] 9:49 AM

## 2019-07-06 NOTE — Progress Notes (Signed)
Subjective: HD#6 Events Overnight: no overnight events   Patient was seen this morning on rounds.  Patient resting comfortably in bed.  States that his breathing has improved.  He states that he is having some abdominal cramping.  He denies any nausea , vomiting or change in appetite.  Patient states that he is tolerating dialysis well.  Objective:  Vital signs in last 24 hours: Vitals:   07/05/19 2351 07/05/19 2354 07/06/19 0533 07/06/19 0535  BP:  (!) 144/71 119/66 119/66  Pulse: 88 85 74 74  Resp: (!) 26 (!) 31 18 15   Temp:  97.6 F (36.4 C) 97.9 F (36.6 C)   TempSrc:  Oral Oral   SpO2: 95% (!) 82% 97% 98%  Weight:   (!) 153.7 kg     Physical Exam: Physical Exam  Constitutional: He is oriented to person, place, and time and well-developed, well-nourished, and in no distress.  HENT:  Head: Normocephalic and atraumatic.  Eyes: EOM are normal.  Cardiovascular: Normal rate, regular rhythm, normal heart sounds and intact distal pulses. Exam reveals no gallop and no friction rub.  No murmur heard. Pulmonary/Chest: Effort normal and breath sounds normal. No respiratory distress. He exhibits no tenderness.  Abdominal: Soft. He exhibits no distension. There is no abdominal tenderness.  Musculoskeletal:        General: No edema.     Cervical back: Normal range of motion.  Neurological: He is alert and oriented to person, place, and time.  Skin: Skin is warm and dry.    Filed Weights   07/04/19 0400 07/05/19 0517 07/06/19 0533  Weight: (!) 153.5 kg (!) 150.7 kg (!) 153.7 kg     Intake/Output Summary (Last 24 hours) at 07/06/2019 8921 Last data filed at 07/05/2019 1939 Gross per 24 hour  Intake 500 ml  Output 775 ml  Net -275 ml    Pertinent labs/Imaging: CBC Latest Ref Rng & Units 07/03/2019 07/02/2019 07/01/2019  WBC 4.0 - 10.5 K/uL 5.5 7.7 6.2  Hemoglobin 13.0 - 17.0 g/dL 11.6(L) 12.3(L) 13.1  Hematocrit 39.0 - 52.0 % 39.2 40.7 44.2  Platelets 150 - 400 K/uL 159 162 199      CMP Latest Ref Rng & Units 07/04/2019 07/03/2019 07/02/2019  Glucose 70 - 99 mg/dL 96 101(H) 83  BUN 6 - 20 mg/dL 35(H) 36(H) 45(H)  Creatinine 0.61 - 1.24 mg/dL 5.52(H) 5.57(H) 6.80(H)  Sodium 135 - 145 mmol/L 136 137 140  Potassium 3.5 - 5.1 mmol/L 4.2 3.7 4.8  Chloride 98 - 111 mmol/L 95(L) 98 99  CO2 22 - 32 mmol/L 26 27 25   Calcium 8.9 - 10.3 mg/dL 8.5(L) 7.5(L) 7.7(L)  Total Protein 6.5 - 8.1 g/dL - - -  Total Bilirubin 0.3 - 1.2 mg/dL - - -  Alkaline Phos 38 - 126 U/L - - -  AST 15 - 41 U/L - - -  ALT 0 - 44 U/L - - -    No results found.  Assessment/Plan:  Principal Problem:   Acute on chronic heart failure with preserved ejection fraction (HFpEF) (HCC) Active Problems:   Diabetes mellitus type 2 in obese ([place])   Obesity hypoventilation syndrome (HCC)   Acute on chronic renal failure [facility])    Patient Summary: Adam Dennis is a 59 y.o. with pertinent PMH of HFrEF, hypertension, OSA/OHS, ESRD admit for acute hypercarbic respiratory failure secondary to ESRD on hospital day 6  #AcuteHypercarbic/Hypoxic Respiratory Failure #ESRD: Patient is tolerating dialysis well.  He states that  he does have some mild abdominal cramping that is intermittent.  He states that his breathing has significantly improved.  He is no longer requiring the BiPAP. -Continue hemodialysis per nephrology -Clip pending - Appreciate nephrology assistance.  #Obesity Hypoventilation Syndrome #OSA -Continue CPAP nightly  #HTN:  Patient's blood pressure has increased since his admission.  His blood pressure medications were held on admission.  He was taking amlodipine 10 mg, carvedilol 12 mg bid, hydralazine 25 mg. Hios blood pressure are still normotensive at 119/67 with some elevated blood pressures in the 160s/60s, but the majority of his pressures are WNL. - Hold antihypertensive therapy  Diet: Renal IVF: nmone VTE: heparin Code: full PT/OT recs: none TOC recs: waiting on  CLIP   Dispo: Anticipated discharge pending CLIP placement.    Marianna Payment, D.O. [facility], PGY-1 Date 07/06/2019 Time 7:12 AM

## 2019-07-06 NOTE — Progress Notes (Signed)
OP HD referral submitted to [facility] to request treatment at [facility] clinic. Patient states preference for second shift any day if possible, but understands this is based on clinic availability. He reports he already utilizes [facility] for transportation. Renal Navigator will follow closely.  Alphonzo Cruise, [place] Renal Navigator [phone]

## 2019-07-06 NOTE — Progress Notes (Signed)
  Mobility Specialist Criteria Algorithm Info.   Mobility:  HOB elevated:Self regulated Activity: Ambulated in hall;Transferred:  Bed to chair;Ambulated to bathroom (to chair after ambulation ) Range of motion: Active;All extremities Level of assistance: Standby assist, set-up cues, supervision of patient - no hands on Assistive device: Front wheel walker (Only in the hallway) Minutes sitting in chair: No data recorded  Minutes stood: 5 minutes Minutes ambulated: 5 minutes Distance ambulated (ft): 100 ft Mobility response: Tolerated well (c/o dizziness briefly before ambulation ) Bed Position: Chair  07/06/2019 2:34 PM

## 2019-07-07 ENCOUNTER — Encounter (HOSPITAL_COMMUNITY): Payer: Self-pay | Admitting: Vascular Surgery

## 2019-07-07 DIAGNOSIS — N186 End stage renal disease: Secondary | ICD-10-CM | POA: Diagnosis not present

## 2019-07-07 DIAGNOSIS — Z992 Dependence on renal dialysis: Secondary | ICD-10-CM | POA: Diagnosis not present

## 2019-07-07 DIAGNOSIS — J9602 Acute respiratory failure with hypercapnia: Secondary | ICD-10-CM | POA: Diagnosis not present

## 2019-07-07 DIAGNOSIS — I5040 Unspecified combined systolic (congestive) and diastolic (congestive) heart failure: Secondary | ICD-10-CM | POA: Diagnosis not present

## 2019-07-07 LAB — GLUCOSE, CAPILLARY
Glucose-Capillary: 92 mg/dL (ref 70–99)
Glucose-Capillary: 88 mg/dL (ref 70–99)

## 2019-07-07 MED ORDER — DEXTROSE 5 % IV SOLN
3.0000 g | INTRAVENOUS | Status: DC
  Filled 2019-07-07: qty 3000

## 2019-07-07 MED ORDER — PHENYLEPHRINE HCL-NACL 10-0.9 MG/250ML-% IV SOLN
INTRAVENOUS | Status: AC
  Filled 2019-07-07: qty 250

## 2019-07-07 MED ORDER — SERTRALINE HCL 25 MG PO TABS: 25.0000 mg | ORAL_TABLET | Freq: Every day | ORAL | 0 refills | Status: DC

## 2019-07-07 MED ORDER — RENA-VITE PO TABS: 1.0000 | ORAL_TABLET | Freq: Every day | ORAL | 0 refills | Status: AC

## 2019-07-07 MED ORDER — ASPIRIN 81 MG PO TBEC: 81.0000 mg | DELAYED_RELEASE_TABLET | Freq: Every day | ORAL | 0 refills | Status: AC

## 2019-07-07 MED ORDER — LIDOCAINE-PRILOCAINE 2.5-2.5 % EX CREA: 1.0000 "application " | TOPICAL_CREAM | CUTANEOUS | 0 refills | Status: DC | PRN

## 2019-07-07 NOTE — Progress Notes (Signed)
Patient has been accepted for OP HD treatment at [facility] clinic on a TTS schedule with a seat time of 12:50pm. He needs to arrive to his appointments 20 minutes early. He is cleared for discharge today per Attending and Nephrology. Therefore, he will start in the clinic tomorrow, 07/08/19. He needs to arrive at 12:00pm to complete intake paperwork prior to his first treatment. Renal Navigator has discussed this with patient and he is understanding and appreciative. Renal Navigator contacted [facility] to make his appointment tomorrow. They will pick up at patient's home between 10:35-11:05am to take to [facility] clinic and then pick patient up at clinic between 5:15-5:45pm to take him back home. Patient, again, aware and very appreciative of support and assistance. OP HD clinic aware of patient's start date and Renal PA notified to send orders. Patient cleared for discharge from a Renal stand point.  Alphonzo Cruise, [place] Renal Navigator [phone]

## 2019-07-07 NOTE — Progress Notes (Signed)
Anesthesia Chart Review:   Case: [id number] Date/Time: 07/08/19 0915   Procedure: ARTERIOVENOUS (AV) FISTULA CREATION RIGHT ARM (Right )   Anesthesia type: Choice   Pre-op diagnosis: CHRONIC KIDNEY DISEASE FOR HEMODIALYSIS ACCESS   Location: [facility] OR ROOM 08 / [facility] OR   Surgeons: Rosetta Posner, MD       DISCUSSION: Patient was scheduled for creation of right arm AVF on 07/08/19, but it appears that he was actually admitted on 06/30/19 with acute on chronic CHF in setting of worsening renal function. Cardiology (Dr. Terrence Dupont), nephrology, and vascular surgery consulted. S/p right IJ tunneled dialysis catheter and right arm brachial-cephalic AVF on 10/26/93 and started hemodialysis. Discharged home 07/07/19. Cased to be cancelled as he underwent scheduled dialysis access surgery last week.   Other history includes never smoker, CAD/MI, chronic combined systolic and diastolic CHF, HTN, DM2, hypercholesterolemia, OSA (CPAP prescribed), COPD (home O2), glaucoma (right eye).   PROVIDERSCharlott Rakes, MD is PCP   IMAGES: 1V CXR 07/01/19: FINDINGS: - Double lumen central venous catheter appears in good position in the superior vena cava at the level of the carina. The patient has chronic prominence of the right heart and pulmonary arteries as demonstrated on the prior CT scan of 09/12/2006. - There is slight vascular congestion on the left. No pneumothorax. No acute bone abnormality. IMPRESSION: Central venous catheter in good position. No pneumothorax. Chronic prominence of the right heart and pulmonary arteries.    EKG: EKG 06/30/19 1938: Sinus rhythm with marked sinus arrhythmia Low voltage QRS Borderline ECG  EKG 06/30/19 1937: Sinus rhythm with marked sinus arrhythmia with occasional Premature ventricular complexes Low voltage QRS Septal infarct , age undetermined Abnormal ECG Premature ventricular complexes , new Confirmed by Shelva Majestic [phone]) on 07/01/2019 8:59:57 PM   CV: Echo  07/01/19: IMPRESSIONS   1. Left ventricular ejection fraction, by visual estimation, is 60 to  65%. The left ventricle has normal function. There is no left ventricular  hypertrophy.   2. Definity contrast agent was given IV to delineate the left ventricular  endocardial borders.   3. The left ventricle has no regional wall motion abnormalities.   4. Abnormal septal motion.   5. Global right ventricle has normal systolic function.The right  ventricular size is normal. No increase in right ventricular wall  thickness.   6. Left atrial size was normal.   7. Right atrial size was mildly dilated.   8. The mitral valve is normal in structure. Trivial mitral valve  regurgitation. No evidence of mitral stenosis.   9. The tricuspid valve is normal in structure.  10. The tricuspid valve is normal in structure. Tricuspid valve  regurgitation is not demonstrated.  11. The aortic valve was not well visualized. Aortic valve regurgitation  is not visualized. No evidence of aortic valve sclerosis or stenosis.  12. The pulmonic valve was not well visualized. Pulmonic valve  regurgitation is not visualized.  13. The inferior vena cava is normal in size with greater than 50%  respiratory variability, suggesting right atrial pressure of 3 mmHg.  14. The interatrial septum was not well visualized.    Past Medical History:  Diagnosis Date   Arthritis    "back" (02/06/2017)   CHF (congestive heart failure) ([place])    new onset /Encounter Date 09/12/2006   Chronic combined systolic and diastolic heart failure (HCC)    CKD (chronic kidney disease) stage 4, GFR 15-29 ml/min (HCC)    COPD (chronic obstructive pulmonary disease) ([name])  Glaucoma, right eye    High cholesterol    Hypertension    Hypertrophy of tonsils alone    Myocardial infarction ([place])    "small one; ~ 2008" (02/06/2017)   Obesity, unspecified    On home oxygen therapy    "4L; 24/7" (04/10/2017)   OSA on CPAP    Type II diabetes  mellitus ([place])     Past Surgical History:  Procedure Laterality Date   AV FISTULA PLACEMENT Right 07/01/2019   Procedure: Right arm arteriovenous fistual;  Surgeon: Waynetta Sandy, MD;  Location: [place];  Service: Vascular;  Laterality: Right;   HERNIA REPAIR     INSERTION OF DIALYSIS CATHETER Right 07/01/2019   Procedure: INSERTION OF TUNNELED DIALYSIS CATHETER;  Surgeon: Waynetta Sandy, MD;  Location: [place];  Service: Vascular;  Laterality: Right;   TEE WITHOUT CARDIOVERSION N/A 04/16/2017   Procedure: TRANSESOPHAGEAL ECHOCARDIOGRAM (TEE);  Surgeon: [name], MD;  Location: [facility] ENDOSCOPY;  Service: Cardiovascular;  Laterality: N/A;   UMBILICAL HERNIA REPAIR  1960s   "when I was little baby"    MEDICATIONS:  [START ON 07/08/2019] ceFAZolin (ANCEF) 3 g in dextrose 5 % 50 mL IVPB    ACCU-CHEK SOFTCLIX LANCETS lancets   albuterol (VENTOLIN HFA) 108 (90 Base) MCG/ACT inhaler   allopurinol (ZYLOPRIM) 100 MG tablet   amLODipine (NORVASC) 10 MG tablet   [START ON 07/08/2019] aspirin EC 81 MG EC tablet   atorvastatin (LIPITOR) 40 MG tablet   Blood Glucose Monitoring Suppl (ACCU-CHEK AVIVA) device   calcitRIOL (ROCALTROL) 0.25 MCG capsule   calcium carbonate (TUMS - DOSED IN MG ELEMENTAL CALCIUM) 500 MG chewable tablet   cetirizine (ZYRTEC) 10 MG tablet   Cholecalciferol (VITAMIN D3) 50 MCG (2000 UT) TABS   ferrous sulfate 325 (65 FE) MG tablet   Fluticasone-Salmeterol (ADVAIR DISKUS) 250-50 MCG/DOSE AEPB   glucose blood (ACCU-CHEK AVIVA PLUS) test strip   Lancet Devices (ACCU-CHEK SOFTCLIX) lancets   lidocaine-prilocaine (EMLA) cream   multivitamin (RENA-VIT) TABS tablet   nitroGLYCERIN (NITROSTAT) 0.4 MG SL tablet   [START ON 07/08/2019] sertraline (ZOLOFT) 25 MG tablet   timolol (TIMOPTIC-XR) 0.5 % ophthalmic gel-forming   TRAVATAN Z 0.004 % SOLN ophthalmic solution    0.9 %  sodium chloride infusion   0.9 %  sodium chloride infusion   acetaminophen  (TYLENOL) tablet 650 mg   Or   acetaminophen (TYLENOL) suppository 650 mg   albuterol (PROVENTIL) (2.5 MG/3ML) 0.083% nebulizer solution 2.5 mg   aspirin EC tablet 81 mg   atorvastatin (LIPITOR) tablet 40 mg   calcitRIOL (ROCALTROL) capsule 0.25 mcg   calcium carbonate (TUMS - dosed in mg elemental calcium) chewable tablet 200 mg of elemental calcium   Chlorhexidine Gluconate Cloth 2 % PADS 6 each   Chlorhexidine Gluconate Cloth 2 % PADS 6 each   heparin injection 1,000 Units   heparin injection 5,000 Units   insulin aspart (novoLOG) injection 0-5 Units   insulin aspart (novoLOG) injection 0-9 Units   latanoprost (XALATAN) 0.005 % ophthalmic solution 1 drop   lidocaine (PF) (XYLOCAINE) 1 % injection 5 mL   lidocaine-prilocaine (EMLA) cream 1 application   loratadine (CLARITIN) tablet 10 mg   mometasone-formoterol (DULERA) 200-5 MCG/ACT inhaler 2 puff   multivitamin (RENA-VIT) tablet 1 tablet   ondansetron (ZOFRAN) tablet 4 mg   Or   ondansetron (ZOFRAN) injection 4 mg   pentafluoroprop-tetrafluoroeth (GEBAUERS) aerosol 1 application   polyethylene glycol (MIRALAX / GLYCOLAX) packet 17 g  senna (SENOKOT) tablet 8.6 mg   sertraline (ZOLOFT) tablet 25 mg   timolol (TIMOPTIC) 0.5 % ophthalmic solution 1 drop    Myra Gianotti, PA-C Surgical Short Stay/Anesthesiology [facility] Phone [phone] [facility] Phone [phone] 07/07/2019 6:50 PM

## 2019-07-07 NOTE — Progress Notes (Signed)
Pt discharged today to home with family.  Pt's IV removed.  Pt not on telemetry.  Pt left with all of their personal belongings.  AVS documentation reviewed with Pt and all questions answered.

## 2019-07-07 NOTE — Progress Notes (Signed)
Patient ID: Adam Dennis, male   DOB: 1960/07/19, 59 y.o.   MRN: [id number]  [facility] Progress Note   Assessment/ Plan:   1.  Volume overload/pulmonary edema: Secondary to progressive chronic kidney disease with diuretic refractoriness.  Status continues to improve with ongoing hemodialysis/ultrafiltration and he does not have any respiratory complaints at this point. 2. ESRD: With progressive chronic kidney disease and started on hemodialysis on 07/01/2019 via right IJ [facility] with a maturing right BCF.  He continues to tolerate hemodialysis without problems as we await outpatient dialysis unit placement.  Will order for his next hemodialysis tomorrow to continue TTS schedule. 3. Anemia: Without overt blood loss, hemoglobin/hematocrit at goal. 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 5. Nutrition: Continue renal diet, started on renal multivitamin. 6. Hypertension: Blood pressure under acceptable control status post hemodialysis.  Subjective:   Tolerated hemodialysis yesterday without problems, no acute events overnight.   Objective:   BP 129/77 (BP Location: Left Arm)   Pulse 77   Temp 99.4 F (37.4 C) (Oral)   Resp (!) 24   Wt (!) 147.9 kg   SpO2 100%   BMI 44.22 kg/m   Physical Exam: Gen: Comfortably resting flat in bed CVS: Pulse regular rhythm, normal rate, S1 and S2 normal Resp: Distant breath sounds bilaterally, no distinct rales or rhonchi Abd: Soft, obese, nontender Ext: 1+ lower extremity edema with right IJ TDC and palpable right BCF that is pulsatile  Labs: BMET Recent Labs  Lab 07/01/19 0223 07/02/19 0512 07/03/19 0719 07/04/19 1823 07/06/19 0754  NA 141 140 137 136 136  K 5.1 4.8 3.7 4.2 3.7  CL 103 99 98 95* 94*  CO2 26 25 27 26 28   GLUCOSE 120* 83 101* 96 104*  BUN 53* 45* 36* 35* 57*  CREATININE 7.51* 6.80* 5.57* 5.52* 7.58*  CALCIUM 8.1* 7.7* 7.5* 8.5* 8.6*  PHOS 8.8* 6.9* 5.3* 5.0* 7.2*    CBC Recent Labs  Lab 07/01/19 0223 07/02/19 0512 07/03/19 0719 07/06/19 0754  WBC 6.2 7.7 5.5 8.9  HGB 13.1 12.3* 11.6* 13.6  HCT 44.2 40.7 39.2 43.9  MCV 94.8 93.1 93.3 91.3  PLT 199 162 159 186     Medications:    . aspirin EC  81 mg Oral Daily  . atorvastatin  40 mg Oral q1800  . calcitRIOL  0.25 mcg Oral Daily  . calcium carbonate  1 tablet Oral BID  . Chlorhexidine Gluconate Cloth  6 each Topical Q0600  . Chlorhexidine Gluconate Cloth  6 each Topical Q0600  . heparin injection (subcutaneous)  5,000 Units Subcutaneous Q8H  . insulin aspart  0-5 Units Subcutaneous QHS  . insulin aspart  0-9 Units Subcutaneous TID WC  . latanoprost  1 drop Both Eyes QHS  . loratadine  10 mg Oral Daily  . mometasone-formoterol  2 puff Inhalation BID  . multivitamin  1 tablet Oral QHS  . polyethylene glycol  17 g Oral Daily  . sertraline  25 mg Oral Daily  . timolol  1 drop Both Eyes Daily   Elmarie Shiley, MD 07/07/2019, 8:13 AM

## 2019-07-07 NOTE — Progress Notes (Signed)
Subjective: HD#7 Events Overnight: No events overnight  Patient was seen this morning on rounds.  Patient was sleeping peacefully in bed.  He denies any symptoms at this time.  States that his breathing is improved and that his swelling has substantially decreased since starting dialysis.  Objective:  Vital signs in last 24 hours: Vitals:   07/06/19 2310 07/07/19 0416 07/07/19 0616 07/07/19 0617  BP:   129/77   Pulse: 74 81 78 77  Resp: 19 19 20  (!) 24  Temp:   99.4 F (37.4 C)   TempSrc:   Oral   SpO2: 100% 95% 99% 100%  Weight:    (!) 147.9 kg    Physical Exam: Physical Exam  Constitutional: He is oriented to person, place, and time and well-developed, well-nourished, and in no distress.  HENT:  Head: Normocephalic and atraumatic.  Eyes: EOM are normal.  Cardiovascular: Normal rate, regular rhythm, normal heart sounds and intact distal pulses.  Pulmonary/Chest: No respiratory distress. He exhibits no tenderness.  Abdominal: Soft. He exhibits no distension. There is no abdominal tenderness.  Musculoskeletal:        General: No tenderness or edema. Normal range of motion.  Neurological: He is alert and oriented to person, place, and time.  Skin: Skin is warm and dry.    Filed Weights   07/06/19 0733 07/06/19 1145 07/07/19 0617  Weight: (!) 151 kg (!) 146.6 kg (!) 147.9 kg     Intake/Output Summary (Last 24 hours) at 07/07/2019 4034 Last data filed at 07/06/2019 2200 Gross per 24 hour  Intake 120 ml  Output 3788 ml  Net -3668 ml    Pertinent labs/Imaging: CBC Latest Ref Rng & Units 07/06/2019 07/03/2019 07/02/2019  WBC 4.0 - 10.5 K/uL 8.9 5.5 7.7  Hemoglobin 13.0 - 17.0 g/dL 13.6 11.6(L) 12.3(L)  Hematocrit 39.0 - 52.0 % 43.9 39.2 40.7  Platelets 150 - 400 K/uL 186 159 162    CMP Latest Ref Rng & Units 07/06/2019 07/04/2019 07/03/2019  Glucose 70 - 99 mg/dL 104(H) 96 101(H)  BUN 6 - 20 mg/dL 57(H) 35(H) 36(H)  Creatinine 0.61 - 1.24 mg/dL 7.58(H) 5.52(H) 5.57(H)    Sodium 135 - 145 mmol/L 136 136 137  Potassium 3.5 - 5.1 mmol/L 3.7 4.2 3.7  Chloride 98 - 111 mmol/L 94(L) 95(L) 98  CO2 22 - 32 mmol/L 28 26 27   Calcium 8.9 - 10.3 mg/dL 8.6(L) 8.5(L) 7.5(L)  Total Protein 6.5 - 8.1 g/dL - - -  Total Bilirubin 0.3 - 1.2 mg/dL - - -  Alkaline Phos 38 - 126 U/L - - -  AST 15 - 41 U/L - - -  ALT 0 - 44 U/L - - -    No results found.  Assessment/Plan:  Principal Problem:   Acute on chronic heart failure with preserved ejection fraction (HFpEF) (HCC) Active Problems:   Diabetes mellitus type 2 in obese ([place])   Obesity hypoventilation syndrome (HCC)   Acute on chronic renal failure [facility])    Patient Summary: Adam Dennis is a 59 y.o. with pertinent PMH of HFrEF, HTN, OSA/OHS, ESRD admit for acute hypercarbic respiratory respiratory failure 2/2 to ESRD on hospital day 7   #AcuteHypercarbic/Hypoxic Respiratory Failure #ESRD: Patient had HD yesterday and tolerated it well.  His BiPAP was discontinued yesterday and I spoke to the nurse to make sure that he uses his CPAP machine overnight.  Otherwise he is not short of breath or having any difficulty breathing. - CLIP pending -  Appreciate nephrologies assistance  #Obesity Hypoventilation Syndrome #OSA - Continue CPAP q HS  #HTN: Patient's blood pressure today are 120's/70's. We will continue to hold antihypertensive medications.  Diet: Renal IVF: none VTE: heparin Code: full PT/OT recs: none TOC recs: CLIP  Dispo: Anticipated discharge pending outpatient dialysis placement.    Marianna Payment, D.O. [facility], PGY-1 Date 07/07/2019 Time 6:20 AM

## 2019-07-07 NOTE — Progress Notes (Signed)
Pt stated that he is not scheduled for surgery tomorrow " I had the surgery while I was there. "

## 2019-07-07 NOTE — TOC Transition Note (Signed)
Transition of Care [facility]) - CM/SW Discharge Note Marvetta Gibbons RN, BSN Transitions of Care Unit 4E- RN Case Manager [phone]   Patient Details  Name: Adam Dennis MRN: [id number] Date of Birth: [birth year]  Transition of Care [facility]) CM/SW Contact:  Dawayne Patricia, RN Phone Number: 07/07/2019, 12:53 PM   Clinical Narrative:    Pt stable for transition home today, per [facility]- renal navigator- [facility] pt has been given the info per [place] regarding HD arrangements. No further TOC needs noted- pt to transition home.    Final next level of care: Home/Self Care Barriers to Discharge: No Barriers Identified   Patient Goals and CMS Choice     Choice offered to / list presented to : NA  Discharge Placement               Home        Discharge Plan and Services                DME Arranged: N/A DME Agency: NA       HH Arranged: NA HH Agency: NA        Social Determinants of Health (SDOH) Interventions     Readmission Risk Interventions Readmission Risk Prevention Plan 07/07/2019  Transportation Screening Complete  PCP or Specialist Appt within 5-7 Days Complete  Home Care Screening Complete  Medication Review (RN CM) Complete  Some recent data might be hidden

## 2019-07-08 ENCOUNTER — Encounter (HOSPITAL_COMMUNITY): Admission: RE | Payer: Self-pay | Source: Ambulatory Visit

## 2019-07-08 ENCOUNTER — Telehealth: Payer: Self-pay

## 2019-07-08 ENCOUNTER — Ambulatory Visit (HOSPITAL_COMMUNITY): Admission: RE | Admit: 2019-07-08 | Payer: Medicare HMO | Source: Ambulatory Visit | Admitting: Vascular Surgery

## 2019-07-08 ENCOUNTER — Encounter: Payer: Self-pay | Admitting: Nephrology

## 2019-07-08 DIAGNOSIS — Z992 Dependence on renal dialysis: Secondary | ICD-10-CM | POA: Diagnosis not present

## 2019-07-08 DIAGNOSIS — N2581 Secondary hyperparathyroidism of renal origin: Secondary | ICD-10-CM | POA: Diagnosis not present

## 2019-07-08 DIAGNOSIS — N186 End stage renal disease: Secondary | ICD-10-CM | POA: Diagnosis not present

## 2019-07-08 LAB — GLUCOSE, CAPILLARY: Glucose-Capillary: 102 mg/dL — ABNORMAL HIGH (ref 70–99)

## 2019-07-08 SURGERY — ARTERIOVENOUS (AV) FISTULA CREATION
Anesthesia: Choice | Laterality: Right

## 2019-07-08 NOTE — Telephone Encounter (Signed)
.  Transition Care Management Follow-up Telephone Call Date of discharge and from where: 07/07/2019, [facility]  Call placed to patient # [phone], message left with call back requested to this CM.   He needs to schedule a follow up appt.

## 2019-07-09 ENCOUNTER — Telehealth: Payer: Self-pay

## 2019-07-09 LAB — CULTURE, BLOOD (ROUTINE X 2)
Culture: NO GROWTH
Culture: NO GROWTH

## 2019-07-09 NOTE — Discharge Summary (Signed)
Name: Adam Dennis MRN: [id number] DOB: Apr 17, 1961 59 y.o. PCP: Charlott Rakes, MD  Date of Admission: 06/30/2019  6:06 AM Date of Discharge: 07/07/2019 Attending Physician: Lalla Brothers, MD FACP  Discharge Diagnosis: 1. Acute Hypoxic Respiratory Failure  Discharge Medications: Allergies as of 07/07/2019   No Known Allergies     Medication List    STOP taking these medications   aspirin 325 MG tablet Replaced by: aspirin 81 MG EC tablet   bumetanide 2 MG tablet Commonly known as: BUMEX   carvedilol 12.5 MG tablet Commonly known as: COREG   hydrALAZINE 25 MG tablet Commonly known as: APRESOLINE   isosorbide mononitrate 30 MG 24 hr tablet Commonly known as: Imdur     TAKE these medications   Accu-Chek Aviva device Use as instructed to check blood sugar up to 3 times daily.   accu-chek softclix lancets Use as instructed daily.   Accu-Chek Softclix Lancets lancets Use as instructed to check blood sugar up to 3 times daily.   albuterol 108 (90 Base) MCG/ACT inhaler Commonly known as: VENTOLIN HFA INHALE 2 PUFFS INTO THE LUNGS EVERY 6 HOURS AS NEEDED FOR WHEEZING OR SHORTNESS OF BREATH What changed: See the new instructions.   allopurinol 100 MG tablet Commonly known as: ZYLOPRIM Take 1 tablet (100 mg total) by mouth daily. Must have office visit for refills What changed: additional instructions   amLODipine 10 MG tablet Commonly known as: NORVASC Take 1 tablet (10 mg total) by mouth daily.   aspirin 81 MG EC tablet Take 1 tablet (81 mg total) by mouth daily. Replaces: aspirin 325 MG tablet   atorvastatin 40 MG tablet Commonly known as: LIPITOR TAKE 1 TABLET(40 MG) BY MOUTH DAILY What changed:   how much to take  how to take this  when to take this  additional instructions   calcitRIOL 0.25 MCG capsule Commonly known as: ROCALTROL Take 0.25 mcg by mouth daily.   calcium carbonate 500 MG chewable tablet Commonly known as: TUMS - dosed in  mg elemental calcium Chew 1 tablet (200 mg of elemental calcium total) by mouth 2 (two) times daily.   cetirizine 10 MG tablet Commonly known as: ZYRTEC Take 1 tablet (10 mg total) by mouth daily.   ferrous sulfate 325 (65 FE) MG tablet Take 1 tablet (325 mg total) by mouth daily with breakfast.   Fluticasone-Salmeterol 250-50 MCG/DOSE Aepb Commonly known as: Advair Diskus Inhale 1 puff into the lungs 2 (two) times daily.   glucose blood test strip Commonly known as: Accu-Chek Aviva Plus Use as instructed to check blood sugar up to 3 times daily.   lidocaine-prilocaine cream Commonly known as: EMLA Apply 1 application topically as needed (topical anesthesia for hemodialysis if Gebauers and Lidocaine injection are ineffective.).   multivitamin Tabs tablet Take 1 tablet by mouth at bedtime.   nitroGLYCERIN 0.4 MG SL tablet Commonly known as: NITROSTAT PLACE 1 TABLET BY MOUTH UNDER TONGUE EVERY 5 MINUTES FOR 3 DOSES AS NEEDED FOR CHEST PAIN What changed: See the new instructions.   sertraline 25 MG tablet Commonly known as: ZOLOFT Take 1 tablet (25 mg total) by mouth daily.   timolol 0.5 % ophthalmic gel-forming Commonly known as: TIMOPTIC-XR Place 1 drop into both eyes daily.   Travatan Z 0.004 % Soln ophthalmic solution Generic drug: Travoprost (BAK Free) Place 1 drop into both eyes at bedtime.   Vitamin D3 50 MCG (2000 UT) Tabs Take 1 tablet by mouth daily.  Disposition and follow-up:   [name] was discharged from [facility] in Good condition.  At the hospital follow up visit please address:  1.  Follow up: Please follow-up with cardiology for your CHF.  Please follow-up with nephrology in your outpatient hemodialysis center for ESRD.  He will need primary care follow-up to manage his chronic medical conditions including hypertension and prediabetes.  2.  Labs / imaging needed at time of follow-up: None  3.  Pending labs/ test  needing follow-up: None  Follow-up Appointments: Follow-up Information    Waynetta Sandy, MD Follow up.   Specialties: Vascular Surgery, Cardiology Why: 4-6 weeks with ultrasound Contact information: [place] [address] [place], [facility] Follow up.   Why: Arrive at 1200noon for 12:50 chair time- TTS schedule- [facility] has been arranged- they will be at your home between 10:35-11:05am for pick up to HD, and then at the HD center between 5:15-5:45pm to take you back home.  Contact information: [place] [address] [phone]           Hospital Course by problem list: 1.  Acute hypoxic respiratory failure - patient was admitted to [facility] on 06/30/2019 with acute change in respiratory status, consisting of dyspnea with exertion, orthopnea, and paroxysmal nocturnal dyspnea.  Patient's respiratory failure was likely multifactorial in origin, consisting of new onset ESRD, CHF, and obesity hypoventilation syndrome.  During his hospitalization the patient became hypercarbic and acidotic requiring BiPAP.  Patient's symptoms resolved with urgent initiation of hemodialysis.  Patient was discharged with a home dialysis center for outpatient hemodialysis.  2.  ESRD -was previously seeing Dr. Hollie Salk for CKD.  Prior to admission they had plan to refer the patient to vascular surgery the following week for AV fistula procedure.  Patient was hospitalized for anuric ESRD and was started on hemodialysis.  He has follow-up set up at an outpatient dialysis center.  3.  OSA/OHS -patient uses CPAP machine at night.  CPAP machine was continued during his hospitalization as needed at night.  4.  Hypertension -patient's antihypertensive medications were discontinued after starting hemodialysis.  His blood pressures remained in a normotensive range throughout the hospital course.  He will need outpatient follow-up with a  primary care physician to restart these medications as needed.  Discharge Vitals:   BP 129/71 (BP Location: Left Arm)   Pulse 76   Temp 97.9 F (36.6 C) (Oral)   Resp 20   Wt (!) 147.9 kg   SpO2 98%   BMI 44.22 kg/m   Pertinent Labs, Studies, and Procedures:  CBC Latest Ref Rng & Units 07/06/2019 07/03/2019 07/02/2019  WBC 4.0 - 10.5 K/uL 8.9 5.5 7.7  Hemoglobin 13.0 - 17.0 g/dL 13.6 11.6(L) 12.3(L)  Hematocrit 39.0 - 52.0 % 43.9 39.2 40.7  Platelets 150 - 400 K/uL 186 159 162   CMP Latest Ref Rng & Units 07/06/2019 07/04/2019 07/03/2019  Glucose 70 - 99 mg/dL 104(H) 96 101(H)  BUN 6 - 20 mg/dL 57(H) 35(H) 36(H)  Creatinine 0.61 - 1.24 mg/dL 7.58(H) 5.52(H) 5.57(H)  Sodium 135 - 145 mmol/L 136 136 137  Potassium 3.5 - 5.1 mmol/L 3.7 4.2 3.7  Chloride 98 - 111 mmol/L 94(L) 95(L) 98  CO2 22 - 32 mmol/L 28 26 27   Calcium 8.9 - 10.3 mg/dL 8.6(L) 8.5(L) 7.5(L)  Total Protein 6.5 - 8.1 g/dL - - -  Total Bilirubin  0.3 - 1.2 mg/dL - - -  Alkaline Phos 38 - 126 U/L - - -  AST 15 - 41 U/L - - -  ALT 0 - 44 U/L - - -      Discharge Instructions: Discharge Instructions    Diet - low sodium heart healthy   Complete by: As directed    Discharge instructions   Complete by: As directed    You were hospitalized for Acute Respiratory Failure. Thank you for allowing [place] to be part of your care.   Please follow up with: 1. Your Primary Care Doctor 2. Vascular Surgery 3. Nephrology  *contact information is listed on your after vvisit summary*  Please note these changes made to your medications:   Please START taking: Please see after visit summary for list of medications.  Please STOP taking:  1. Asprin 325 2. Bumex 2 mg 3. Carvedilol 12.5 mg  4. Hydralazine 25 mg 5. Imdur 30 mg  Please make sure to follow up with your primary care doctor within 1 week.  Please call our clinic if you have any questions or concerns, we may be able to help and keep you from a long and expensive  emergency room wait. Our clinic and after hours phone number is [phone], the best time to call is Monday through Friday 9 am to 4 pm but there is always someone available 24/7 if you have an emergency. If you need medication refills please notify your pharmacy one week in advance and they will send [place] a request.   Increase activity slowly   Complete by: As directed       Signed: Marianna Payment, MD 07/09/2019, 2:48 PM   Pager: [phone]

## 2019-07-09 NOTE — Telephone Encounter (Signed)
Transition Care Management Follow-up Telephone Call Date of discharge and from where: 07/07/2019, [facility]   Call placed to the patient # [phone], message left with call back requested to this CM.   Patient needs to schedule a follow up appointment

## 2019-07-10 DIAGNOSIS — N186 End stage renal disease: Secondary | ICD-10-CM | POA: Diagnosis not present

## 2019-07-10 DIAGNOSIS — N2581 Secondary hyperparathyroidism of renal origin: Secondary | ICD-10-CM | POA: Diagnosis not present

## 2019-07-10 DIAGNOSIS — Z992 Dependence on renal dialysis: Secondary | ICD-10-CM | POA: Diagnosis not present

## 2019-07-12 ENCOUNTER — Other Ambulatory Visit: Payer: Self-pay

## 2019-07-12 NOTE — Patient Outreach (Signed)
[place] [facility]) Care Management  07/12/2019  BRANCH PACITTI 1960-09-02 [id number]    EMMI-General Discharge RED ON EMMI ALERT Day # 1 Date: 07/09/2019 Red Alert Reason: "Scheduled follow-up? No"   Outreach attempt # 1 to patient. Spoke with patient who denies any acute issues or concerns at present. Reviewed and addressed red alert with patient. He reports that he called PCP office this morning and made follow up appt. He has telephone visit appt scheduled for next week. He states that he goes for vascular appt this Friday. He denies any issues with transportation. Patient confirms he has all his meds. He reports he has not been taking Zyrtec as he does not have any issue with allergies at this time. He denies any RN  CM needs or concerns at present. Advised patient that they would get one more automated EMMI-GENERAL post discharge calls to assess how they are doing following recent hospitalization and will receive a call from a nurse if any of their responses were abnormal. Patient voiced understanding and was appreciative of f/u call.   Plan: RN CM will close case at this time.   Enzo Montgomery, RN,BSN,CCM [facility] Telephonic Care Management Coordinator Direct Phone: [phone] Toll Free: [phone] Fax: [phone]

## 2019-07-13 DIAGNOSIS — Z992 Dependence on renal dialysis: Secondary | ICD-10-CM | POA: Diagnosis not present

## 2019-07-13 DIAGNOSIS — N186 End stage renal disease: Secondary | ICD-10-CM | POA: Diagnosis not present

## 2019-07-13 DIAGNOSIS — N2581 Secondary hyperparathyroidism of renal origin: Secondary | ICD-10-CM | POA: Diagnosis not present

## 2019-07-15 DIAGNOSIS — N186 End stage renal disease: Secondary | ICD-10-CM | POA: Diagnosis not present

## 2019-07-15 DIAGNOSIS — N2581 Secondary hyperparathyroidism of renal origin: Secondary | ICD-10-CM | POA: Diagnosis not present

## 2019-07-15 DIAGNOSIS — Z992 Dependence on renal dialysis: Secondary | ICD-10-CM | POA: Diagnosis not present

## 2019-07-17 DIAGNOSIS — N186 End stage renal disease: Secondary | ICD-10-CM | POA: Diagnosis not present

## 2019-07-17 DIAGNOSIS — N2581 Secondary hyperparathyroidism of renal origin: Secondary | ICD-10-CM | POA: Diagnosis not present

## 2019-07-17 DIAGNOSIS — Z992 Dependence on renal dialysis: Secondary | ICD-10-CM | POA: Diagnosis not present

## 2019-07-20 DIAGNOSIS — N186 End stage renal disease: Secondary | ICD-10-CM | POA: Diagnosis not present

## 2019-07-20 DIAGNOSIS — N2581 Secondary hyperparathyroidism of renal origin: Secondary | ICD-10-CM | POA: Diagnosis not present

## 2019-07-20 DIAGNOSIS — Z992 Dependence on renal dialysis: Secondary | ICD-10-CM | POA: Diagnosis not present

## 2019-07-22 ENCOUNTER — Other Ambulatory Visit: Payer: Self-pay

## 2019-07-22 ENCOUNTER — Ambulatory Visit: Payer: Medicare HMO | Attending: Family Medicine | Admitting: Family Medicine

## 2019-07-22 DIAGNOSIS — E1122 Type 2 diabetes mellitus with diabetic chronic kidney disease: Secondary | ICD-10-CM

## 2019-07-22 DIAGNOSIS — I1311 Hypertensive heart and chronic kidney disease without heart failure, with stage 5 chronic kidney disease, or end stage renal disease: Secondary | ICD-10-CM

## 2019-07-22 DIAGNOSIS — Z992 Dependence on renal dialysis: Secondary | ICD-10-CM | POA: Diagnosis not present

## 2019-07-22 DIAGNOSIS — F0631 Mood disorder due to known physiological condition with depressive features: Secondary | ICD-10-CM

## 2019-07-22 DIAGNOSIS — N186 End stage renal disease: Secondary | ICD-10-CM | POA: Diagnosis not present

## 2019-07-22 DIAGNOSIS — E1121 Type 2 diabetes mellitus with diabetic nephropathy: Secondary | ICD-10-CM

## 2019-07-22 DIAGNOSIS — N2581 Secondary hyperparathyroidism of renal origin: Secondary | ICD-10-CM | POA: Diagnosis not present

## 2019-07-22 DIAGNOSIS — G4733 Obstructive sleep apnea (adult) (pediatric): Secondary | ICD-10-CM

## 2019-07-22 MED ORDER — SERTRALINE HCL 25 MG PO TABS: 25.0000 mg | ORAL_TABLET | Freq: Every day | ORAL | 3 refills | Status: DC

## 2019-07-22 NOTE — Progress Notes (Signed)
Virtual Visit via Telephone Note  I connected with Adam Dennis, on 07/22/2019 at 8:51 AM by telephone due to the COVID-19 pandemic and verified that I am speaking with the correct person using two identifiers.   Consent: I discussed the limitations, risks, security and privacy concerns of performing an evaluation and management service by telephone and the availability of in person appointments. I also discussed with the patient that there may be a patient responsible charge related to this service. The patient expressed understanding and agreed to proceed.   Location of Patient: Home  Location of Provider: Clinic   Persons participating in Telemedicine visit: Rayhaan Huster Farrington-CMA Dr. Margarita Rana     History of Present Illness: Adam Dennis  is a 59 year old male with a history of hypertension, ESRD on HD (on T, Th, Sat), obstructive sleep apnea (on CPAP at night), type 2 diabetes mellitus (A1c 6.2 diet controlled), combined systolic and diastolic CHF (EF 18-84% from echo of 06/2019) , obesity hypoventilation syndrome previously on 4 L of oxygen for chronic respiratory failure (no longer requires oxygen) who presents today for a follow-up visit. He had a hospitalization at [facility] between 06/30/2019 and 07/07/2019 for acute hypoxic respiratory failure thought to be secondary to CHF exacerbation and new onset ESRD (he had presented with anuria).  Symptoms improved with hemodialysis.  He commenced dialysis 2 weeks ago and is coping okay emotionally. Denies dyspnea, pedal edema. He denies anxiety or being depressed but he is on Zoloft which he states has been effective and causes him not to think so much about his conditions..  He also has the support of his family. He was taken off a bunch of medications during his most recent hospitalization after commencing hemodialysis. Unsure of his appointment with Cardiology  With regards to his diabetes mellitus, fasting  sugar this morning was 81, he denies hypoglycemia, paresthesia but endorses blurry vision on some occassions; sees Ophthalmology next week.  Past Medical History:  Diagnosis Date  . Arthritis    "back" (02/06/2017)  . CHF (congestive heart failure) ([place])    new onset /Encounter Date 09/12/2006  . Chronic combined systolic and diastolic heart failure ([place])   . CKD (chronic kidney disease) stage 4, GFR 15-29 ml/min (HCC)   . COPD (chronic obstructive pulmonary disease) ([place])   . Glaucoma, right eye   . High cholesterol   . Hypertension   . Hypertrophy of tonsils alone   . Myocardial infarction [facility])    "small one; ~ 2008" (02/06/2017)  . Obesity, unspecified   . On home oxygen therapy    "4L; 24/7" (04/10/2017)  . OSA on CPAP   . Type II diabetes mellitus (HCC)    No Known Allergies  Current Outpatient Medications on File Prior to Visit  Medication Sig Dispense Refill  . ACCU-CHEK SOFTCLIX LANCETS lancets Use as instructed to check blood sugar up to 3 times daily. 100 each 11  . albuterol (VENTOLIN HFA) 108 (90 Base) MCG/ACT inhaler INHALE 2 PUFFS INTO THE LUNGS EVERY 6 HOURS AS NEEDED FOR WHEEZING OR SHORTNESS OF BREATH (Patient taking differently: Inhale 2 puffs into the lungs every 6 (six) hours as needed for wheezing or shortness of breath. ) 54 g 0  . allopurinol (ZYLOPRIM) 100 MG tablet Take 1 tablet (100 mg total) by mouth daily. Must have office visit for refills (Patient taking differently: Take 100 mg by mouth daily. ) 90 tablet 0  . amLODipine (NORVASC) 10 MG tablet  Take 1 tablet (10 mg total) by mouth daily. 90 tablet 1  . aspirin EC 81 MG EC tablet Take 1 tablet (81 mg total) by mouth daily. 30 tablet 0  . atorvastatin (LIPITOR) 40 MG tablet TAKE 1 TABLET(40 MG) BY MOUTH DAILY (Patient taking differently: Take 40 mg by mouth daily. ) 90 tablet 1  . Blood Glucose Monitoring Suppl (ACCU-CHEK AVIVA) device Use as instructed to check blood sugar up to 3 times daily. 1 each 0  .  calcitRIOL (ROCALTROL) 0.25 MCG capsule Take 0.25 mcg by mouth daily.     . calcium carbonate (TUMS - DOSED IN MG ELEMENTAL CALCIUM) 500 MG chewable tablet Chew 1 tablet (200 mg of elemental calcium total) by mouth 2 (two) times daily.    . cetirizine (ZYRTEC) 10 MG tablet Take 1 tablet (10 mg total) by mouth daily. 30 tablet 11  . Cholecalciferol (VITAMIN D3) 50 MCG (2000 UT) TABS Take 1 tablet by mouth daily.    . ferrous sulfate 325 (65 FE) MG tablet Take 1 tablet (325 mg total) by mouth daily with breakfast. 100 tablet 3  . Fluticasone-Salmeterol (ADVAIR DISKUS) 250-50 MCG/DOSE AEPB Inhale 1 puff into the lungs 2 (two) times daily. 1 each 6  . glucose blood (ACCU-CHEK AVIVA PLUS) test strip Use as instructed to check blood sugar up to 3 times daily. 100 each 11  . Lancet Devices (ACCU-CHEK SOFTCLIX) lancets Use as instructed daily. 1 each 5  . lidocaine-prilocaine (EMLA) cream Apply 1 application topically as needed (topical anesthesia for hemodialysis if Gebauers and Lidocaine injection are ineffective.). 30 g 0  . multivitamin (RENA-VIT) TABS tablet Take 1 tablet by mouth at bedtime. 30 tablet 0  . nitroGLYCERIN (NITROSTAT) 0.4 MG SL tablet PLACE 1 TABLET BY MOUTH UNDER TONGUE EVERY 5 MINUTES FOR 3 DOSES AS NEEDED FOR CHEST PAIN (Patient taking differently: Place 0.4 mg under the tongue every 5 (five) minutes as needed for chest pain. ) 25 tablet 0  . sertraline (ZOLOFT) 25 MG tablet Take 1 tablet (25 mg total) by mouth daily. 30 tablet 0  . timolol (TIMOPTIC-XR) 0.5 % ophthalmic gel-forming Place 1 drop into both eyes daily.     . TRAVATAN Z 0.004 % SOLN ophthalmic solution Place 1 drop into both eyes at bedtime.  12   No current facility-administered medications on file prior to visit.    Observations/Objective: Awake, alert, and 2x3 Not in acute distress  Lab Results  Component Value Date   HGBA1C 6.2 (H) 06/30/2019    Assessment and Plan: 1. 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 Diabetic nephropathy associated with type 2 diabetes mellitus ([place]) Diabetes is diet controlled with A1c of 6.2 Continue to monitor blood sugars Adherence to a diabetic diet and exercise has been emphasized  3. OSA (obstructive sleep apnea) Controlled on CPAP  4.  Depression due to physical illness Doing well on Zoloft Psychotherapy not indicated at this time  Follow Up Instructions: 3 months for chronic medical conditions   I discussed the assessment and treatment plan with the patient. The patient was provided an opportunity to ask questions and all were answered. The patient agreed with the plan and demonstrated an understanding of the instructions.   The patient was advised to call back or seek an in-person evaluation if the symptoms worsen or if the condition fails to improve as  anticipated.     I provided 17 minutes total of non-face-to-face time during this encounter including median intraservice time, reviewing previous notes, investigations, ordering medications, medical decision making, coordinating care and patient verbalized understanding at the end of the visit.     Charlott Rakes, MD, FAAFP. [facility] and [place], [place]   07/22/2019, 8:51 AM

## 2019-07-22 NOTE — Progress Notes (Signed)
Patient has been called and DOB has been verified. Patient has been screened and transferred to PCP to start phone visit.    Patient states that he just started dialysis. Patient states that the kidney doctor took him off some medication but he is not home and does not know the medications.

## 2019-07-23 ENCOUNTER — Encounter: Payer: Self-pay | Admitting: Family Medicine

## 2019-07-24 DIAGNOSIS — N186 End stage renal disease: Secondary | ICD-10-CM | POA: Diagnosis not present

## 2019-07-24 DIAGNOSIS — Z992 Dependence on renal dialysis: Secondary | ICD-10-CM | POA: Diagnosis not present

## 2019-07-24 DIAGNOSIS — N2581 Secondary hyperparathyroidism of renal origin: Secondary | ICD-10-CM | POA: Diagnosis not present

## 2019-07-25 DIAGNOSIS — N186 End stage renal disease: Secondary | ICD-10-CM | POA: Diagnosis not present

## 2019-07-25 DIAGNOSIS — Z992 Dependence on renal dialysis: Secondary | ICD-10-CM | POA: Diagnosis not present

## 2019-07-25 DIAGNOSIS — E1129 Type 2 diabetes mellitus with other diabetic kidney complication: Secondary | ICD-10-CM | POA: Diagnosis not present

## 2019-07-27 DIAGNOSIS — N2581 Secondary hyperparathyroidism of renal origin: Secondary | ICD-10-CM | POA: Diagnosis not present

## 2019-07-27 DIAGNOSIS — Z992 Dependence on renal dialysis: Secondary | ICD-10-CM | POA: Diagnosis not present

## 2019-07-27 DIAGNOSIS — N186 End stage renal disease: Secondary | ICD-10-CM | POA: Diagnosis not present

## 2019-07-28 ENCOUNTER — Telehealth: Payer: Self-pay | Admitting: Family Medicine

## 2019-07-28 DIAGNOSIS — H524 Presbyopia: Secondary | ICD-10-CM | POA: Diagnosis not present

## 2019-07-28 DIAGNOSIS — H2513 Age-related nuclear cataract, bilateral: Secondary | ICD-10-CM | POA: Diagnosis not present

## 2019-07-28 DIAGNOSIS — H401123 Primary open-angle glaucoma, left eye, severe stage: Secondary | ICD-10-CM | POA: Diagnosis not present

## 2019-07-28 DIAGNOSIS — H40021 Open angle with borderline findings, high risk, right eye: Secondary | ICD-10-CM | POA: Diagnosis not present

## 2019-07-28 DIAGNOSIS — H52203 Unspecified astigmatism, bilateral: Secondary | ICD-10-CM | POA: Diagnosis not present

## 2019-07-28 DIAGNOSIS — H10413 Chronic giant papillary conjunctivitis, bilateral: Secondary | ICD-10-CM | POA: Diagnosis not present

## 2019-07-28 DIAGNOSIS — H5213 Myopia, bilateral: Secondary | ICD-10-CM | POA: Diagnosis not present

## 2019-07-28 DIAGNOSIS — E119 Type 2 diabetes mellitus without complications: Secondary | ICD-10-CM | POA: Diagnosis not present

## 2019-07-28 NOTE — Telephone Encounter (Signed)
Paperwork has been received and patient will be called once paperwork is ready for pick up. 

## 2019-07-28 NOTE — Telephone Encounter (Signed)
Pt dropped on medical clearance for dental treatment to be completed by doctor newlin and faxed to [phone] once completed,,,told of the 7-14 day turn around

## 2019-07-29 ENCOUNTER — Other Ambulatory Visit: Payer: Self-pay | Admitting: Physician Assistant

## 2019-07-29 DIAGNOSIS — N186 End stage renal disease: Secondary | ICD-10-CM | POA: Diagnosis not present

## 2019-07-29 DIAGNOSIS — M1A00X0 Idiopathic chronic gout, unspecified site, without tophus (tophi): Secondary | ICD-10-CM

## 2019-07-29 DIAGNOSIS — N2581 Secondary hyperparathyroidism of renal origin: Secondary | ICD-10-CM | POA: Diagnosis not present

## 2019-07-29 DIAGNOSIS — Z992 Dependence on renal dialysis: Secondary | ICD-10-CM | POA: Diagnosis not present

## 2019-07-30 ENCOUNTER — Ambulatory Visit: Payer: Medicare HMO | Admitting: Podiatry

## 2019-07-31 DIAGNOSIS — N186 End stage renal disease: Secondary | ICD-10-CM | POA: Diagnosis not present

## 2019-07-31 DIAGNOSIS — Z992 Dependence on renal dialysis: Secondary | ICD-10-CM | POA: Diagnosis not present

## 2019-07-31 DIAGNOSIS — N2581 Secondary hyperparathyroidism of renal origin: Secondary | ICD-10-CM | POA: Diagnosis not present

## 2019-08-02 DIAGNOSIS — I10 Essential (primary) hypertension: Secondary | ICD-10-CM | POA: Diagnosis not present

## 2019-08-02 DIAGNOSIS — E785 Hyperlipidemia, unspecified: Secondary | ICD-10-CM | POA: Diagnosis not present

## 2019-08-02 DIAGNOSIS — N186 End stage renal disease: Secondary | ICD-10-CM | POA: Diagnosis not present

## 2019-08-02 DIAGNOSIS — M109 Gout, unspecified: Secondary | ICD-10-CM | POA: Diagnosis not present

## 2019-08-02 DIAGNOSIS — I5042 Chronic combined systolic (congestive) and diastolic (congestive) heart failure: Secondary | ICD-10-CM | POA: Diagnosis not present

## 2019-08-03 ENCOUNTER — Other Ambulatory Visit: Payer: Self-pay | Admitting: Internal Medicine

## 2019-08-03 DIAGNOSIS — N186 End stage renal disease: Secondary | ICD-10-CM | POA: Diagnosis not present

## 2019-08-03 DIAGNOSIS — Z992 Dependence on renal dialysis: Secondary | ICD-10-CM | POA: Diagnosis not present

## 2019-08-03 DIAGNOSIS — N2581 Secondary hyperparathyroidism of renal origin: Secondary | ICD-10-CM | POA: Diagnosis not present

## 2019-08-05 DIAGNOSIS — N186 End stage renal disease: Secondary | ICD-10-CM | POA: Diagnosis not present

## 2019-08-05 DIAGNOSIS — N2581 Secondary hyperparathyroidism of renal origin: Secondary | ICD-10-CM | POA: Diagnosis not present

## 2019-08-05 DIAGNOSIS — Z992 Dependence on renal dialysis: Secondary | ICD-10-CM | POA: Diagnosis not present

## 2019-08-07 DIAGNOSIS — N186 End stage renal disease: Secondary | ICD-10-CM | POA: Diagnosis not present

## 2019-08-07 DIAGNOSIS — N2581 Secondary hyperparathyroidism of renal origin: Secondary | ICD-10-CM | POA: Diagnosis not present

## 2019-08-07 DIAGNOSIS — Z992 Dependence on renal dialysis: Secondary | ICD-10-CM | POA: Diagnosis not present

## 2019-08-10 DIAGNOSIS — Z992 Dependence on renal dialysis: Secondary | ICD-10-CM | POA: Diagnosis not present

## 2019-08-10 DIAGNOSIS — N186 End stage renal disease: Secondary | ICD-10-CM | POA: Diagnosis not present

## 2019-08-10 DIAGNOSIS — N2581 Secondary hyperparathyroidism of renal origin: Secondary | ICD-10-CM | POA: Diagnosis not present

## 2019-08-11 ENCOUNTER — Encounter: Payer: Self-pay | Admitting: Family Medicine

## 2019-08-11 ENCOUNTER — Ambulatory Visit: Payer: Medicare HMO | Attending: Family Medicine | Admitting: Family Medicine

## 2019-08-11 ENCOUNTER — Other Ambulatory Visit: Payer: Self-pay

## 2019-08-11 VITALS — BP 116/74 | HR 71 | Ht 72.0 in | Wt 319.0 lb

## 2019-08-11 DIAGNOSIS — Z992 Dependence on renal dialysis: Secondary | ICD-10-CM | POA: Diagnosis not present

## 2019-08-11 DIAGNOSIS — I1311 Hypertensive heart and chronic kidney disease without heart failure, with stage 5 chronic kidney disease, or end stage renal disease: Secondary | ICD-10-CM | POA: Diagnosis not present

## 2019-08-11 DIAGNOSIS — E1122 Type 2 diabetes mellitus with diabetic chronic kidney disease: Secondary | ICD-10-CM | POA: Diagnosis not present

## 2019-08-11 DIAGNOSIS — N186 End stage renal disease: Secondary | ICD-10-CM

## 2019-08-11 DIAGNOSIS — J438 Other emphysema: Secondary | ICD-10-CM | POA: Diagnosis not present

## 2019-08-11 MED ORDER — AMLODIPINE BESYLATE 5 MG PO TABS: 5.0000 mg | ORAL_TABLET | Freq: Every day | ORAL | 1 refills | Status: DC

## 2019-08-11 MED ORDER — MULTIVITAMIN ADULT PO TABS: 1.0000 | ORAL_TABLET | Freq: Every day | ORAL | 3 refills | Status: DC

## 2019-08-11 NOTE — Progress Notes (Signed)
Subjective:  Patient ID: Adam Dennis, male    DOB: 04-07-1961  Age: 59 y.o. MRN: [id number]  CC: Hypertension   HPI Adam Dennis a 59 year old male with a history of hypertension, ESRD on HD (on T, Th, Sat), obstructive sleep apnea (on CPAP at night), type 2 diabetes mellitus (A1c 6.2 diet controlled), combined systolic and diastolic CHF (EF 65-78% from echo of 06/2019) , obesity hypoventilation syndrome previously on 4 L of oxygen for chronic respiratory failure (no longer requires oxygen),Emphysema who presents today for an office visit. He had a visit for chronic disease management, 3 weeks ago. He requests a multivitamin which he received when he was discharged from the hospital which was beneficial for him. Hemodialysis sessions are going well; with regards to his antihypertensive he is only on the amlodipine due to blood pressure being on the soft side.  He denies presence of chest pains or pedal edema. With respect to his diabetes mellitus he is on diet control and denies presence of hypoglycemia. Last eye exam was last week. His Emphysema is stable and he denies acute flares.  Past Medical History:  Diagnosis Date  . Arthritis    "back" (02/06/2017)  . CHF (congestive heart failure) ([place])    new onset /Encounter Date 09/12/2006  . Chronic combined systolic and diastolic heart failure ([place])   . CKD (chronic kidney disease) stage 4, GFR 15-29 ml/min (HCC)   . COPD (chronic obstructive pulmonary disease) ([place])   . Glaucoma, right eye   . High cholesterol   . Hypertension   . Hypertrophy of tonsils alone   . Myocardial infarction [facility])    "small one; ~ 2008" (02/06/2017)  . Obesity, unspecified   . On home oxygen therapy    "4L; 24/7" (04/10/2017)  . OSA on CPAP   . Type II diabetes mellitus ([place])     Past Surgical History:  Procedure Laterality Date  . AV FISTULA PLACEMENT Right 07/01/2019   Procedure: Right arm arteriovenous fistual;  Surgeon: Waynetta Sandy, MD;  Location: [place];  Service: Vascular;  Laterality: Right;  . HERNIA REPAIR    . INSERTION OF DIALYSIS CATHETER Right 07/01/2019   Procedure: INSERTION OF TUNNELED DIALYSIS CATHETER;  Surgeon: Waynetta Sandy, MD;  Location: [place];  Service: Vascular;  Laterality: Right;  . TEE WITHOUT CARDIOVERSION N/A 04/16/2017   Procedure: TRANSESOPHAGEAL ECHOCARDIOGRAM (TEE);  Surgeon: [name], MD;  Location: [facility] ENDOSCOPY;  Service: Cardiovascular;  Laterality: N/A;  . UMBILICAL HERNIA REPAIR  1960s   "when I was little baby"    Family History  Problem Relation Age of Onset  . Hypertension Mother   . Diabetes Brother   . Diabetes Sister   . Cancer Maternal Aunt   . Amblyopia Neg Hx   . Blindness Neg Hx   . Cataracts Neg Hx   . Glaucoma Neg Hx   . Macular degeneration Neg Hx   . Strabismus Neg Hx   . Retinal detachment Neg Hx   . Retinitis pigmentosa Neg Hx     No Known Allergies  Outpatient Medications Prior to Visit  Medication Sig Dispense Refill  . ACCU-CHEK SOFTCLIX LANCETS lancets Use as instructed to check blood sugar up to 3 times daily. 100 each 11  . albuterol (VENTOLIN HFA) 108 (90 Base) MCG/ACT inhaler INHALE 2 PUFFS INTO THE LUNGS EVERY 6 HOURS AS NEEDED FOR WHEEZING OR SHORTNESS OF BREATH (Patient taking differently: Inhale 2 puffs into the lungs every  6 (six) hours as needed for wheezing or shortness of breath. ) 54 g 0  . allopurinol (ZYLOPRIM) 100 MG tablet Take 1 tablet (100 mg total) by mouth daily. 90 tablet 0  . amLODipine (NORVASC) 10 MG tablet Take 1 tablet (10 mg total) by mouth daily. 90 tablet 1  . atorvastatin (LIPITOR) 40 MG tablet TAKE 1 TABLET(40 MG) BY MOUTH DAILY (Patient taking differently: Take 40 mg by mouth daily. ) 90 tablet 1  . Blood Glucose Monitoring Suppl (ACCU-CHEK AVIVA) device Use as instructed to check blood sugar up to 3 times daily. 1 each 0  . calcitRIOL (ROCALTROL) 0.25 MCG capsule Take 0.25 mcg by mouth daily.      . calcium carbonate (TUMS - DOSED IN MG ELEMENTAL CALCIUM) 500 MG chewable tablet Chew 1 tablet (200 mg of elemental calcium total) by mouth 2 (two) times daily.    . cetirizine (ZYRTEC) 10 MG tablet Take 1 tablet (10 mg total) by mouth daily. 30 tablet 11  . Cholecalciferol (VITAMIN D3) 50 MCG (2000 UT) TABS Take 1 tablet by mouth daily.    . ferrous sulfate 325 (65 FE) MG tablet Take 1 tablet (325 mg total) by mouth daily with breakfast. 100 tablet 3  . Fluticasone-Salmeterol (ADVAIR DISKUS) 250-50 MCG/DOSE AEPB Inhale 1 puff into the lungs 2 (two) times daily. 1 each 6  . glucose blood (ACCU-CHEK AVIVA PLUS) test strip Use as instructed to check blood sugar up to 3 times daily. 100 each 11  . Lancet Devices (ACCU-CHEK SOFTCLIX) lancets Use as instructed daily. 1 each 5  . lidocaine-prilocaine (EMLA) cream Apply 1 application topically as needed (topical anesthesia for hemodialysis if Gebauers and Lidocaine injection are ineffective.). 30 g 0  . nitroGLYCERIN (NITROSTAT) 0.4 MG SL tablet PLACE 1 TABLET BY MOUTH UNDER TONGUE EVERY 5 MINUTES FOR 3 DOSES AS NEEDED FOR CHEST PAIN (Patient taking differently: Place 0.4 mg under the tongue every 5 (five) minutes as needed for chest pain. ) 25 tablet 0  . sertraline (ZOLOFT) 25 MG tablet Take 1 tablet (25 mg total) by mouth daily. 30 tablet 3  . timolol (TIMOPTIC-XR) 0.5 % ophthalmic gel-forming Place 1 drop into both eyes daily.     . TRAVATAN Z 0.004 % SOLN ophthalmic solution Place 1 drop into both eyes at bedtime.  12   No facility-administered medications prior to visit.     ROS Review of Systems  Constitutional: Negative for activity change and appetite change.  HENT: Negative for sinus pressure and sore throat.   Eyes: Negative for visual disturbance.  Respiratory: Negative for cough, chest tightness and shortness of breath.   Cardiovascular: Negative for chest pain and leg swelling.  Gastrointestinal: Negative for abdominal distention,  abdominal pain, constipation and diarrhea.  Endocrine: Negative.   Genitourinary: Negative for dysuria.  Musculoskeletal: Negative for joint swelling and myalgias.  Skin: Negative for rash.  Allergic/Immunologic: Negative.   Neurological: Negative for weakness, light-headedness and numbness.  Psychiatric/Behavioral: Negative for dysphoric mood and suicidal ideas.    Objective:  BP 116/74   Pulse 71   Ht 6' (1.829 m)   Wt (!) 319 lb (144.7 kg)   SpO2 99%   BMI 43.26 kg/m   BP/Weight 08/11/2019 07/07/2019 5/46/5681  Systolic BP 275 170 017  Diastolic BP 74 71 95  Wt. (Lbs) 319 326.06 352  BMI 43.26 44.22 47.74      Physical Exam Constitutional:      Appearance: He is well-developed.  Neck:  Vascular: No JVD.  Cardiovascular:     Rate and Rhythm: Normal rate.     Heart sounds: Normal heart sounds. No murmur.  Pulmonary:     Effort: Pulmonary effort is normal.     Breath sounds: Normal breath sounds. No wheezing or rales.     Comments: Perm-A-cath on right upper chest wall Chest:     Chest wall: No tenderness.  Abdominal:     General: Bowel sounds are normal. There is no distension.     Palpations: Abdomen is soft. There is no mass.     Tenderness: There is no abdominal tenderness.  Musculoskeletal:        General: Normal range of motion.     Right lower leg: No edema.     Left lower leg: No edema.     Comments: Right upper AV fistula with palpable thrill  Neurological:     Mental Status: He is alert and oriented to person, place, and time.  Psychiatric:        Mood and Affect: Mood normal.     CMP Latest Ref Rng & Units 07/06/2019 07/04/2019 07/03/2019  Glucose 70 - 99 mg/dL 104(H) 96 101(H)  BUN 6 - 20 mg/dL 57(H) 35(H) 36(H)  Creatinine 0.61 - 1.24 mg/dL 7.58(H) 5.52(H) 5.57(H)  Sodium 135 - 145 mmol/L 136 136 137  Potassium 3.5 - 5.1 mmol/L 3.7 4.2 3.7  Chloride 98 - 111 mmol/L 94(L) 95(L) 98  CO2 22 - 32 mmol/L 28 26 27   Calcium 8.9 - 10.3 mg/dL 8.6(L)  8.5(L) 7.5(L)  Total Protein 6.5 - 8.1 g/dL - - -  Total Bilirubin 0.3 - 1.2 mg/dL - - -  Alkaline Phos 38 - 126 U/L - - -  AST 15 - 41 U/L - - -  ALT 0 - 44 U/L - - -    Lipid Panel     Component Value Date/Time   CHOL 119 03/24/2019 1017   TRIG 59 03/24/2019 1017   HDL 51 03/24/2019 1017   CHOLHDL 2.3 03/24/2019 1017   CHOLHDL 2.8 10/07/2014 1547   VLDL 13 10/07/2014 1547   LDLCALC 55 03/24/2019 1017    CBC    Component Value Date/Time   WBC 8.9 07/06/2019 0754   RBC 4.81 07/06/2019 0754   HGB 13.6 07/06/2019 0754   HGB 12.8 (L) 03/24/2019 1017   HCT 43.9 07/06/2019 0754   HCT 39.7 03/24/2019 1017   PLT 186 07/06/2019 0754   PLT 191 03/24/2019 1017   MCV 91.3 07/06/2019 0754   MCV 87 03/24/2019 1017   MCH 28.3 07/06/2019 0754   MCHC 31.0 07/06/2019 0754   RDW 16.5 (H) 07/06/2019 0754   RDW 13.9 03/24/2019 1017   LYMPHSABS 1.3 03/24/2019 1017   MONOABS 0.8 04/28/2017 1236   EOSABS 0.0 03/24/2019 1017   BASOSABS 0.0 03/24/2019 1017    Lab Results  Component Value Date   HGBA1C 6.2 (H) 06/30/2019    Assessment & Plan:  1. 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 ([place]) Euvolemic with EF of 60 to 65% Continue amlodipine Continue fluid restriction, daily weights  2.  Type 2 diabetes mellitus without insulin use with end-stage renal disease ([place]) Continue hemodialysis as per schedule Continue dietary management of diabetes mellitus Prescribed multivitamins as per request  3. Other emphysema ([place]) Stable Currently on Advair Proventil MDI as needed   Return in about 3 months (around 11/11/2019) for Chronic disease management.       Julieth Tugman  Margarita Rana, MD, FAAFP. [facility] and [facility] [place], [place]   08/11/2019, 10:53 AM

## 2019-08-11 NOTE — Patient Instructions (Signed)

## 2019-08-12 ENCOUNTER — Other Ambulatory Visit: Payer: Self-pay | Admitting: *Deleted

## 2019-08-12 DIAGNOSIS — N2581 Secondary hyperparathyroidism of renal origin: Secondary | ICD-10-CM | POA: Diagnosis not present

## 2019-08-12 DIAGNOSIS — N184 Chronic kidney disease, stage 4 (severe): Secondary | ICD-10-CM

## 2019-08-12 DIAGNOSIS — N186 End stage renal disease: Secondary | ICD-10-CM | POA: Diagnosis not present

## 2019-08-12 DIAGNOSIS — Z992 Dependence on renal dialysis: Secondary | ICD-10-CM | POA: Diagnosis not present

## 2019-08-13 ENCOUNTER — Ambulatory Visit (INDEPENDENT_AMBULATORY_CARE_PROVIDER_SITE_OTHER): Payer: Self-pay | Admitting: Physician Assistant

## 2019-08-13 ENCOUNTER — Other Ambulatory Visit: Payer: Self-pay

## 2019-08-13 ENCOUNTER — Ambulatory Visit (HOSPITAL_COMMUNITY)
Admission: RE | Admit: 2019-08-13 | Discharge: 2019-08-13 | Disposition: A | Discharge status: Home / Self Care | Payer: Medicare HMO | Source: Ambulatory Visit | Attending: Vascular Surgery | Admitting: Vascular Surgery

## 2019-08-13 VITALS — BP 134/94 | HR 67 | Temp 97.6°F | Ht 71.0 in | Wt 318.2 lb

## 2019-08-13 DIAGNOSIS — N184 Chronic kidney disease, stage 4 (severe): Secondary | ICD-10-CM | POA: Diagnosis not present

## 2019-08-13 NOTE — Progress Notes (Signed)
POST OPERATIVE OFFICE NOTE    CC:  F/u for surgery; 6 weeks postop  HPI:  This is a 59 y.o. male who is s/p right upper arm brachiocephalic AV fistula and placement of tunneled dialysis catheter by Dr. Donzetta Matters on July 01, 2019. He denies hand pain, numbness, fever or chills  He dialyzes Tuesdays, Thursdays and Saturdays at the [facility]  No Known Allergies  Current Outpatient Medications  Medication Sig Dispense Refill  . ACCU-CHEK SOFTCLIX LANCETS lancets Use as instructed to check blood sugar up to 3 times daily. 100 each 11  . albuterol (VENTOLIN HFA) 108 (90 Base) MCG/ACT inhaler INHALE 2 PUFFS INTO THE LUNGS EVERY 6 HOURS AS NEEDED FOR WHEEZING OR SHORTNESS OF BREATH (Patient taking differently: Inhale 2 puffs into the lungs every 6 (six) hours as needed for wheezing or shortness of breath. ) 54 g 0  . allopurinol (ZYLOPRIM) 100 MG tablet Take 1 tablet (100 mg total) by mouth daily. 90 tablet 0  . amLODipine (NORVASC) 5 MG tablet Take 1 tablet (5 mg total) by mouth daily. 90 tablet 1  . Blood Glucose Monitoring Suppl (ACCU-CHEK AVIVA) device Use as instructed to check blood sugar up to 3 times daily. 1 each 0  . calcitRIOL (ROCALTROL) 0.25 MCG capsule Take 0.25 mcg by mouth daily.     . calcium carbonate (TUMS - DOSED IN MG ELEMENTAL CALCIUM) 500 MG chewable tablet Chew 1 tablet (200 mg of elemental calcium total) by mouth 2 (two) times daily.    . Cholecalciferol (VITAMIN D3) 50 MCG (2000 UT) TABS Take 1 tablet by mouth daily.    . ferrous sulfate 325 (65 FE) MG tablet Take 1 tablet (325 mg total) by mouth daily with breakfast. 100 tablet 3  . Fluticasone-Salmeterol (ADVAIR DISKUS) 250-50 MCG/DOSE AEPB Inhale 1 puff into the lungs 2 (two) times daily. 1 each 6  . glucose blood (ACCU-CHEK AVIVA PLUS) test strip Use as instructed to check blood sugar up to 3 times daily. 100 each 11  . Lancet Devices (ACCU-CHEK SOFTCLIX) lancets Use as instructed daily. 1 each 5  .  lidocaine-prilocaine (EMLA) cream Apply 1 application topically as needed (topical anesthesia for hemodialysis if Gebauers and Lidocaine injection are ineffective.). 30 g 0  . Multiple Vitamin (MULTIVITAMIN ADULT) TABS Take 1 tablet by mouth daily. 30 tablet 3  . nitroGLYCERIN (NITROSTAT) 0.4 MG SL tablet PLACE 1 TABLET BY MOUTH UNDER TONGUE EVERY 5 MINUTES FOR 3 DOSES AS NEEDED FOR CHEST PAIN (Patient taking differently: Place 0.4 mg under the tongue every 5 (five) minutes as needed for chest pain. ) 25 tablet 0  . sertraline (ZOLOFT) 25 MG tablet Take 1 tablet (25 mg total) by mouth daily. 30 tablet 3  . timolol (TIMOPTIC-XR) 0.5 % ophthalmic gel-forming Place 1 drop into both eyes daily.     . TRAVATAN Z 0.004 % SOLN ophthalmic solution Place 1 drop into both eyes at bedtime.  12  . atorvastatin (LIPITOR) 40 MG tablet TAKE 1 TABLET(40 MG) BY MOUTH DAILY (Patient not taking: Reported on 08/13/2019) 90 tablet 1   No current facility-administered medications for this visit.   Vitals:   08/13/19 1013  Weight: (!) 318 lb 3.2 oz (144.3 kg)  Height: 5\' 11"  (1.803 m)    ROS:  See HPI  Physical Exam:  Incision: Right upper extremity incision is well-healed.   Extremities:  There is a good thrill and bruit in the fistula.  He has 5 out of  5 hand grip.  I cannot palpate his ulnar or radial pulses.  His hand is warm and well perfused with intact sensation and motor function Neuro: Alert and oriented x4 in no apparent distress Right chest catheter site is without erythema or drainage.  Dressing in place  Assessment/Plan:  This is a 59 y.o. male who is s/p: Right upper arm brachiocephalic AV fistula creation.  His duplex today reveals adequate diameter of the vein, however it is too deep to access.  I reviewed the duplex with Dr. Donzetta Matters who recommends superficialization versus transposition on nondialysis day.  -I discussed this plan with the patient who is in agreement with proceeding.  He is not on  Coumadin or DOAC.   Risa Grill, PA-C [facility] [phone]  Clinic MD: Donzetta Matters

## 2019-08-14 DIAGNOSIS — Z992 Dependence on renal dialysis: Secondary | ICD-10-CM | POA: Diagnosis not present

## 2019-08-14 DIAGNOSIS — N186 End stage renal disease: Secondary | ICD-10-CM | POA: Diagnosis not present

## 2019-08-14 DIAGNOSIS — N2581 Secondary hyperparathyroidism of renal origin: Secondary | ICD-10-CM | POA: Diagnosis not present

## 2019-08-17 DIAGNOSIS — N186 End stage renal disease: Secondary | ICD-10-CM | POA: Diagnosis not present

## 2019-08-17 DIAGNOSIS — N2581 Secondary hyperparathyroidism of renal origin: Secondary | ICD-10-CM | POA: Diagnosis not present

## 2019-08-17 DIAGNOSIS — Z992 Dependence on renal dialysis: Secondary | ICD-10-CM | POA: Diagnosis not present

## 2019-08-19 DIAGNOSIS — N2581 Secondary hyperparathyroidism of renal origin: Secondary | ICD-10-CM | POA: Diagnosis not present

## 2019-08-19 DIAGNOSIS — N186 End stage renal disease: Secondary | ICD-10-CM | POA: Diagnosis not present

## 2019-08-19 DIAGNOSIS — Z992 Dependence on renal dialysis: Secondary | ICD-10-CM | POA: Diagnosis not present

## 2019-08-21 DIAGNOSIS — N186 End stage renal disease: Secondary | ICD-10-CM | POA: Diagnosis not present

## 2019-08-21 DIAGNOSIS — Z992 Dependence on renal dialysis: Secondary | ICD-10-CM | POA: Diagnosis not present

## 2019-08-21 DIAGNOSIS — N2581 Secondary hyperparathyroidism of renal origin: Secondary | ICD-10-CM | POA: Diagnosis not present

## 2019-08-24 DIAGNOSIS — N186 End stage renal disease: Secondary | ICD-10-CM | POA: Diagnosis not present

## 2019-08-24 DIAGNOSIS — N2581 Secondary hyperparathyroidism of renal origin: Secondary | ICD-10-CM | POA: Diagnosis not present

## 2019-08-24 DIAGNOSIS — Z992 Dependence on renal dialysis: Secondary | ICD-10-CM | POA: Diagnosis not present

## 2019-08-25 DIAGNOSIS — Z992 Dependence on renal dialysis: Secondary | ICD-10-CM | POA: Diagnosis not present

## 2019-08-25 DIAGNOSIS — E1122 Type 2 diabetes mellitus with diabetic chronic kidney disease: Secondary | ICD-10-CM | POA: Diagnosis not present

## 2019-08-25 DIAGNOSIS — N186 End stage renal disease: Secondary | ICD-10-CM | POA: Diagnosis not present

## 2019-08-26 DIAGNOSIS — N186 End stage renal disease: Secondary | ICD-10-CM | POA: Diagnosis not present

## 2019-08-26 DIAGNOSIS — N2581 Secondary hyperparathyroidism of renal origin: Secondary | ICD-10-CM | POA: Diagnosis not present

## 2019-08-26 DIAGNOSIS — Z992 Dependence on renal dialysis: Secondary | ICD-10-CM | POA: Diagnosis not present

## 2019-08-28 DIAGNOSIS — N2581 Secondary hyperparathyroidism of renal origin: Secondary | ICD-10-CM | POA: Diagnosis not present

## 2019-08-28 DIAGNOSIS — N186 End stage renal disease: Secondary | ICD-10-CM | POA: Diagnosis not present

## 2019-08-28 DIAGNOSIS — Z992 Dependence on renal dialysis: Secondary | ICD-10-CM | POA: Diagnosis not present

## 2019-08-30 ENCOUNTER — Encounter (HOSPITAL_COMMUNITY): Payer: Self-pay | Admitting: Vascular Surgery

## 2019-08-30 ENCOUNTER — Other Ambulatory Visit (HOSPITAL_COMMUNITY)
Admission: RE | Admit: 2019-08-30 | Discharge: 2019-08-30 | Disposition: A | Discharge status: Home / Self Care | Payer: Medicare HMO | Source: Ambulatory Visit | Attending: Vascular Surgery | Admitting: Vascular Surgery

## 2019-08-30 DIAGNOSIS — Z20822 Contact with and (suspected) exposure to covid-19: Secondary | ICD-10-CM | POA: Diagnosis not present

## 2019-08-30 DIAGNOSIS — Z01812 Encounter for preprocedural laboratory examination: Secondary | ICD-10-CM | POA: Insufficient documentation

## 2019-08-30 LAB — SARS CORONAVIRUS 2 (TAT 6-24 HRS): SARS Coronavirus 2: NEGATIVE

## 2019-08-30 NOTE — Progress Notes (Signed)
Patient denies chest pain or shob (more than he uses the oxygen for). States he is quarantining post COVID test. States he diabetes is diet controlled.

## 2019-08-31 DIAGNOSIS — Z992 Dependence on renal dialysis: Secondary | ICD-10-CM | POA: Diagnosis not present

## 2019-08-31 DIAGNOSIS — N186 End stage renal disease: Secondary | ICD-10-CM | POA: Diagnosis not present

## 2019-08-31 DIAGNOSIS — N2581 Secondary hyperparathyroidism of renal origin: Secondary | ICD-10-CM | POA: Diagnosis not present

## 2019-08-31 MED ORDER — DEXTROSE 5 % IV SOLN
3.0000 g | INTRAVENOUS | Status: AC
  Administered 2019-09-01: 11:00:00 3 g via INTRAVENOUS
  Filled 2019-08-31: qty 3
  Filled 2019-08-31: qty 3000

## 2019-08-31 NOTE — Progress Notes (Signed)
Anesthesia Chart Review: SAME DAY WORK-UP   Case: [id number] Date/Time: 09/01/19 0958   Procedure: RIGHT UPPER ARM FISTULA SUPERFICIALIZATION VERSES TRANSPOSITION OF RIGHT UPPPER ARM BRACHIAL CEPHALIC FISTULA (Right )   Anesthesia type: Monitor Anesthesia Care   Pre-op diagnosis: CHRONIC KIDNEY DISEASE STAGE 4   Location: [place] OR ROOM 11 / [place] OR   Surgeons: Waynetta Sandy, MD      DISCUSSION: Patient is a 59 year old male scheduled for the above procedure. He is s/p right IJ tunneled dialysis catheter and right arm brachial-cephalic AVF on 0/4/54 during admission for acute on chronic CHF in the setting of worsening renal function. Cardiology (Dr. Terrence Dupont), nephrology, and vascular surgery consulted during that admission and was started on hemodialysis.   History includes never smoker, CAD (reported "small" MI ~ 2008; troponin-I 0.05 during 0/9811 acute diastolic CHF admission, otherwise no MI history seen in review of multiple cardiology notes), chronic combined systolic and diastolic CHF, ESRD (TTS hemodialysis), HTN, DM2, hypercholesterolemia, OSA (CPAP prescribed), COPD (home O2), glaucoma (right eye).  08/30/19 presurgical COVID-19 test negative.  He is a same-day work-up, so labs and anesthesia evaluation on the day of surgery.   VS:  BP Readings from Last 3 Encounters:  08/13/19 (!) 134/94  08/11/19 116/74  07/07/19 129/71   Pulse Readings from Last 3 Encounters:  08/13/19 67  08/11/19 71  07/07/19 76    PROVIDERS: Charlott Rakes, MD is PCP  Madelon Lips, MD is nephrologist Dixie Dials, MD is cardiologist, although he also saw Charolette Forward, MD during 06/2019 admission and Adrian Prows, MD during 08/2006 admission for acute diastolic CHF.    LABS: He is for ISTAT labs on arrival.    IMAGES: 1V CXR 07/01/19: FINDINGS: - Double lumen central venous catheter appears in good position in the superior vena cava at the level of the carina. The patient has chronic  prominence of the right heart and pulmonary arteries as demonstrated on the prior CT scan of 09/12/2006. - There is slight vascular congestion on the left. No pneumothorax. No acute bone abnormality. IMPRESSION: Central venous catheter in good position. No pneumothorax. Chronic prominence of the right heart and pulmonary arteries.   EKG: EKG 06/30/19 1938: Sinus rhythm with marked sinus arrhythmia Low voltage QRS Borderline ECG  EKG 06/30/19 1937: Sinus rhythm with marked sinus arrhythmia with occasional Premature ventricular complexes Low voltage QRS Septal infarct , age undetermined Abnormal ECG Premature ventricular complexes , new Confirmed by Shelva Majestic [phone]) on 07/01/2019 8:59:57 PM   CV: Echo 07/01/19: IMPRESSIONS  1. Left ventricular ejection fraction, by visual estimation, is 60 to  65%. The left ventricle has normal function. There is no left ventricular  hypertrophy.  2. Definity contrast agent was given IV to delineate the left ventricular  endocardial borders.  3. The left ventricle has no regional wall motion abnormalities.  4. Abnormal septal motion.  5. Global right ventricle has normal systolic function.The right  ventricular size is normal. No increase in right ventricular wall  thickness.  6. Left atrial size was normal.  7. Right atrial size was mildly dilated.  8. The mitral valve is normal in structure. Trivial mitral valve  regurgitation. No evidence of mitral stenosis.  9. The tricuspid valve is normal in structure.  10. The tricuspid valve is normal in structure. Tricuspid valve  regurgitation is not demonstrated.  11. The aortic valve was not well visualized. Aortic valve regurgitation  is not visualized. No evidence of aortic valve sclerosis  or stenosis.  12. The pulmonic valve was not well visualized. Pulmonic valve  regurgitation is not visualized.  13. The inferior vena cava is normal in size with greater than 50%  respiratory  variability, suggesting right atrial pressure of 3 mmHg.  14. The interatrial septum was not well visualized.    Past Medical History:  Diagnosis Date  . Arthritis    "back" (02/06/2017)  . CHF (congestive heart failure) ([place])    new onset /Encounter Date 09/12/2006  . Chronic combined systolic and diastolic heart failure ([place])   . CKD (chronic kidney disease) stage 4, GFR 15-29 ml/min (HCC)   . COPD (chronic obstructive pulmonary disease) ([place])   . End stage renal disease on dialysis [facility])    Tues/ Thur/ [address]  . Glaucoma, right eye   . High cholesterol   . Hypertension   . Hypertrophy of tonsils alone   . Myocardial infarction [facility])    "small one; ~ 2008" (02/06/2017)  . Obesity, unspecified   . On home oxygen therapy    "4L; 24/7" (04/10/2017)  . OSA on CPAP   . Type II diabetes mellitus ([place])     Past Surgical History:  Procedure Laterality Date  . AV FISTULA PLACEMENT Right 07/01/2019   Procedure: Right arm arteriovenous fistual;  Surgeon: Waynetta Sandy, MD;  Location: [place];  Service: Vascular;  Laterality: Right;  . HERNIA REPAIR    . INSERTION OF DIALYSIS CATHETER Right 07/01/2019   Procedure: INSERTION OF TUNNELED DIALYSIS CATHETER;  Surgeon: Waynetta Sandy, MD;  Location: [place];  Service: Vascular;  Laterality: Right;  . TEE WITHOUT CARDIOVERSION N/A 04/16/2017   Procedure: TRANSESOPHAGEAL ECHOCARDIOGRAM (TEE);  Surgeon: [name], MD;  Location: [facility] ENDOSCOPY;  Service: Cardiovascular;  Laterality: N/A;  . [place]   "when I was little baby"    MEDICATIONS: . [START ON 09/01/2019] ceFAZolin (ANCEF) 3 g in dextrose 5 % 50 mL IVPB   . acetaminophen (TYLENOL) 500 MG tablet  . albuterol (VENTOLIN HFA) 108 (90 Base) MCG/ACT inhaler  . amLODipine (NORVASC) 5 MG tablet  . aspirin EC 81 MG tablet  . atorvastatin (LIPITOR) 40 MG tablet  . calcium carbonate (TUMS - DOSED IN MG ELEMENTAL CALCIUM) 500 MG chewable  tablet  . cholecalciferol (VITAMIN D3) 25 MCG (1000 UNIT) tablet  . Fluticasone-Salmeterol (ADVAIR DISKUS) 250-50 MCG/DOSE AEPB  . lidocaine-prilocaine (EMLA) cream  . Multiple Vitamin (MULTIVITAMIN ADULT) TABS  . nitroGLYCERIN (NITROSTAT) 0.4 MG SL tablet  . sertraline (ZOLOFT) 25 MG tablet  . timolol (TIMOPTIC-XR) 0.5 % ophthalmic gel-forming  . TRAVATAN Z 0.004 % SOLN ophthalmic solution  . ACCU-CHEK SOFTCLIX LANCETS lancets  . allopurinol (ZYLOPRIM) 100 MG tablet  . Blood Glucose Monitoring Suppl (ACCU-CHEK AVIVA) device  . ferrous sulfate 325 (65 FE) MG tablet  . glucose blood (ACCU-CHEK AVIVA PLUS) test strip  . Lancet Devices (ACCU-CHEK SOFTCLIX) lancets    Myra Gianotti, PA-C Surgical Short Stay/Anesthesiology [facility] Phone [phone] [facility] Phone [phone] 08/31/2019 2:04 PM

## 2019-08-31 NOTE — Anesthesia Preprocedure Evaluation (Addendum)
Anesthesia Evaluation  Patient identified by MRN, date of birth, ID band Patient awake    Reviewed: Allergy & Precautions, NPO status , Patient's Chart, lab work & pertinent test results  Airway Mallampati: III  TM Distance: >3 FB Neck ROM: Full    Dental  (+) Poor Dentition, Loose,    Pulmonary sleep apnea and Continuous Positive Airway Pressure Ventilation , COPD,  COPD inhaler,    Pulmonary exam normal breath sounds clear to auscultation       Cardiovascular hypertension, Pt. on medications + CAD, + Past MI and +CHF  Normal cardiovascular exam Rhythm:Regular Rate:Normal  ECG: SR, rate 74  ECHO: 1. Left ventricular ejection fraction, by visual estimation, is 60 to 65%. The left ventricle has normal function. There is no left ventricular hypertrophy. 2. Definity contrast agent was given IV to delineate the left ventricular endocardial borders. 3. The left ventricle has no regional wall motion abnormalities. 4. Abnormal septal motion. 5. Global right ventricle has normal systolic function.The right ventricular size is normal. No increase in right ventricular wall thickness. 6. Left atrial size was normal. 7. Right atrial size was mildly dilated. 8. The mitral valve is normal in structure. Trivial mitral valve regurgitation. No evidence of mitral stenosis. 9. The tricuspid valve is normal in structure. 10. The tricuspid valve is normal in structure. Tricuspid valve regurgitation is not demonstrated. 11. The aortic valve was not well visualized. Aortic valve regurgitation is not visualized. No evidence of aortic valve sclerosis or stenosis. 12. The pulmonic valve was not well visualized. Pulmonic valve regurgitation is not visualized. 13. The inferior vena cava is normal in size with greater than 50% respiratory variability, suggesting right atrial pressure of 3 mmHg. 14. The interatrial septum was not well visualized.    Neuro/Psych PSYCHIATRIC DISORDERS Depression negative neurological ROS     GI/Hepatic negative GI ROS, Neg liver ROS,   Endo/Other  diabetesMorbid obesity  Renal/GU ESRF and DialysisRenal diseaseOn HD T, R, Sat     Musculoskeletal Gout   Abdominal (+) + obese,   Peds  Hematology HLD   Anesthesia Other Findings CHRONIC KIDNEY DISEASE STAGE 4  Reproductive/Obstetrics                            Anesthesia Physical Anesthesia Plan  ASA: IV  Anesthesia Plan: Regional   Post-op Pain Management:    Induction: Intravenous  PONV Risk Score and Plan: 1 and Ondansetron, Dexamethasone, Propofol infusion and Treatment may vary due to age or medical condition  Airway Management Planned: Nasal Cannula  Additional Equipment:   Intra-op Plan:   Post-operative Plan:   Informed Consent: I have reviewed the patients History and Physical, chart, labs and discussed the procedure including the risks, benefits and alternatives for the proposed anesthesia with the patient or authorized representative who has indicated his/her understanding and acceptance.     Dental advisory given  Plan Discussed with: CRNA  Anesthesia Plan Comments: (Reviewed PAT note written 08/31/2019 by Myra Gianotti, PA-C. )     Anesthesia Quick Evaluation

## 2019-09-01 ENCOUNTER — Ambulatory Visit (HOSPITAL_COMMUNITY)
Admission: RE | Admit: 2019-09-01 | Discharge: 2019-09-01 | Disposition: A | Discharge status: Home / Self Care | Payer: Medicare HMO | Source: Ambulatory Visit | Attending: Vascular Surgery | Admitting: Vascular Surgery

## 2019-09-01 ENCOUNTER — Other Ambulatory Visit: Payer: Self-pay

## 2019-09-01 ENCOUNTER — Encounter (HOSPITAL_COMMUNITY): Payer: Self-pay | Admitting: Vascular Surgery

## 2019-09-01 ENCOUNTER — Ambulatory Visit (HOSPITAL_COMMUNITY): Payer: Medicare HMO | Admitting: Vascular Surgery

## 2019-09-01 ENCOUNTER — Encounter (HOSPITAL_COMMUNITY)
Admission: RE | Disposition: A | Discharge status: Home / Self Care | Payer: Self-pay | Source: Ambulatory Visit | Attending: Vascular Surgery

## 2019-09-01 DIAGNOSIS — E1122 Type 2 diabetes mellitus with diabetic chronic kidney disease: Secondary | ICD-10-CM | POA: Diagnosis not present

## 2019-09-01 DIAGNOSIS — T82898A Other specified complication of vascular prosthetic devices, implants and grafts, initial encounter: Secondary | ICD-10-CM

## 2019-09-01 DIAGNOSIS — M109 Gout, unspecified: Secondary | ICD-10-CM | POA: Diagnosis not present

## 2019-09-01 DIAGNOSIS — Y832 Surgical operation with anastomosis, bypass or graft as the cause of abnormal reaction of the patient, or of later complication, without mention of misadventure at the time of the procedure: Secondary | ICD-10-CM | POA: Insufficient documentation

## 2019-09-01 DIAGNOSIS — E785 Hyperlipidemia, unspecified: Secondary | ICD-10-CM | POA: Diagnosis not present

## 2019-09-01 DIAGNOSIS — Z992 Dependence on renal dialysis: Secondary | ICD-10-CM

## 2019-09-01 DIAGNOSIS — T82510A Breakdown (mechanical) of surgically created arteriovenous fistula, initial encounter: Secondary | ICD-10-CM | POA: Diagnosis not present

## 2019-09-01 DIAGNOSIS — I5042 Chronic combined systolic (congestive) and diastolic (congestive) heart failure: Secondary | ICD-10-CM | POA: Diagnosis not present

## 2019-09-01 DIAGNOSIS — Z6841 Body Mass Index (BMI) 40.0 and over, adult: Secondary | ICD-10-CM | POA: Diagnosis not present

## 2019-09-01 DIAGNOSIS — G4733 Obstructive sleep apnea (adult) (pediatric): Secondary | ICD-10-CM | POA: Diagnosis not present

## 2019-09-01 DIAGNOSIS — Z79899 Other long term (current) drug therapy: Secondary | ICD-10-CM | POA: Diagnosis not present

## 2019-09-01 DIAGNOSIS — I252 Old myocardial infarction: Secondary | ICD-10-CM | POA: Insufficient documentation

## 2019-09-01 DIAGNOSIS — Z7982 Long term (current) use of aspirin: Secondary | ICD-10-CM | POA: Insufficient documentation

## 2019-09-01 DIAGNOSIS — Z9981 Dependence on supplemental oxygen: Secondary | ICD-10-CM | POA: Diagnosis not present

## 2019-09-01 DIAGNOSIS — E7800 Pure hypercholesterolemia, unspecified: Secondary | ICD-10-CM | POA: Diagnosis not present

## 2019-09-01 DIAGNOSIS — I132 Hypertensive heart and chronic kidney disease with heart failure and with stage 5 chronic kidney disease, or end stage renal disease: Secondary | ICD-10-CM | POA: Diagnosis not present

## 2019-09-01 DIAGNOSIS — E669 Obesity, unspecified: Secondary | ICD-10-CM | POA: Insufficient documentation

## 2019-09-01 DIAGNOSIS — J449 Chronic obstructive pulmonary disease, unspecified: Secondary | ICD-10-CM | POA: Diagnosis not present

## 2019-09-01 DIAGNOSIS — N186 End stage renal disease: Secondary | ICD-10-CM

## 2019-09-01 DIAGNOSIS — I2510 Atherosclerotic heart disease of native coronary artery without angina pectoris: Secondary | ICD-10-CM | POA: Diagnosis not present

## 2019-09-01 DIAGNOSIS — F329 Major depressive disorder, single episode, unspecified: Secondary | ICD-10-CM | POA: Diagnosis not present

## 2019-09-01 DIAGNOSIS — N179 Acute kidney failure, unspecified: Secondary | ICD-10-CM | POA: Diagnosis not present

## 2019-09-01 DIAGNOSIS — H409 Unspecified glaucoma: Secondary | ICD-10-CM | POA: Insufficient documentation

## 2019-09-01 DIAGNOSIS — I5033 Acute on chronic diastolic (congestive) heart failure: Secondary | ICD-10-CM | POA: Diagnosis not present

## 2019-09-01 HISTORY — PX: FISTULA SUPERFICIALIZATION: SHX6341

## 2019-09-01 HISTORY — DX: End stage renal disease: N18.6

## 2019-09-01 LAB — POCT I-STAT, CHEM 8
BUN: 30 mg/dL — ABNORMAL HIGH (ref 6–20)
Calcium, Ion: 1.1 mmol/L — ABNORMAL LOW (ref 1.15–1.40)
Chloride: 96 mmol/L — ABNORMAL LOW (ref 98–111)
Creatinine, Ser: 6.2 mg/dL — ABNORMAL HIGH (ref 0.61–1.24)
Glucose, Bld: 91 mg/dL (ref 70–99)
HCT: 40 % (ref 39.0–52.0)
Hemoglobin: 13.6 g/dL (ref 13.0–17.0)
Potassium: 4.4 mmol/L (ref 3.5–5.1)
Sodium: 137 mmol/L (ref 135–145)
TCO2: 36 mmol/L — ABNORMAL HIGH (ref 22–32)

## 2019-09-01 LAB — GLUCOSE, CAPILLARY
Glucose-Capillary: 87 mg/dL (ref 70–99)
Glucose-Capillary: 94 mg/dL (ref 70–99)

## 2019-09-01 SURGERY — FISTULA SUPERFICIALIZATION
Anesthesia: Regional | Site: Arm Upper | Laterality: Right

## 2019-09-01 MED ORDER — PHENYLEPHRINE HCL-NACL 10-0.9 MG/250ML-% IV SOLN
INTRAVENOUS | Status: DC | PRN
  Administered 2019-09-01: 25 ug/min via INTRAVENOUS

## 2019-09-01 MED ORDER — ONDANSETRON HCL 4 MG/2ML IJ SOLN
INTRAMUSCULAR | Status: AC
  Filled 2019-09-01: qty 2

## 2019-09-01 MED ORDER — FENTANYL CITRATE (PF) 100 MCG/2ML IJ SOLN: 50.0000 ug | Freq: Once | INTRAMUSCULAR | Status: AC

## 2019-09-01 MED ORDER — FENTANYL CITRATE (PF) 100 MCG/2ML IJ SOLN: 25.0000 ug | INTRAMUSCULAR | Status: DC | PRN

## 2019-09-01 MED ORDER — DEXAMETHASONE SODIUM PHOSPHATE 10 MG/ML IJ SOLN
INTRAMUSCULAR | Status: DC | PRN
  Administered 2019-09-01: 5 mg via INTRAVENOUS

## 2019-09-01 MED ORDER — PROPOFOL 1000 MG/100ML IV EMUL
INTRAVENOUS | Status: AC
  Filled 2019-09-01: qty 100

## 2019-09-01 MED ORDER — CHLORHEXIDINE GLUCONATE 4 % EX LIQD: 60.0000 mL | Freq: Once | CUTANEOUS | Status: DC

## 2019-09-01 MED ORDER — ONDANSETRON HCL 4 MG/2ML IJ SOLN: 4.0000 mg | Freq: Once | INTRAMUSCULAR | Status: DC | PRN

## 2019-09-01 MED ORDER — OXYCODONE-ACETAMINOPHEN 5-325 MG PO TABS: 1.0000 | ORAL_TABLET | Freq: Four times a day (QID) | ORAL | 0 refills | Status: DC | PRN

## 2019-09-01 MED ORDER — LIDOCAINE-EPINEPHRINE (PF) 1.5 %-1:200000 IJ SOLN
INTRAMUSCULAR | Status: DC | PRN
  Administered 2019-09-01: 30 mL via PERINEURAL

## 2019-09-01 MED ORDER — ONDANSETRON HCL 4 MG/2ML IJ SOLN
INTRAMUSCULAR | Status: DC | PRN
  Administered 2019-09-01: 4 mg via INTRAVENOUS

## 2019-09-01 MED ORDER — 0.9 % SODIUM CHLORIDE (POUR BTL) OPTIME
TOPICAL | Status: DC | PRN
  Administered 2019-09-01: 1000 mL

## 2019-09-01 MED ORDER — PROPOFOL 500 MG/50ML IV EMUL
INTRAVENOUS | Status: DC | PRN
  Administered 2019-09-01: 100 ug/kg/min via INTRAVENOUS

## 2019-09-01 MED ORDER — FENTANYL CITRATE (PF) 100 MCG/2ML IJ SOLN
INTRAMUSCULAR | Status: DC | PRN
  Administered 2019-09-01: 25 ug via INTRAVENOUS

## 2019-09-01 MED ORDER — SODIUM CHLORIDE 0.9 % IV SOLN: INTRAVENOUS | Status: DC

## 2019-09-01 MED ORDER — SODIUM CHLORIDE 0.9 % IV SOLN
INTRAVENOUS | Status: DC | PRN
  Administered 2019-09-01: 500 mL

## 2019-09-01 MED ORDER — ACETAMINOPHEN 10 MG/ML IV SOLN: 1000.0000 mg | Freq: Once | INTRAVENOUS | Status: DC | PRN

## 2019-09-01 MED ORDER — SODIUM CHLORIDE 0.9 % IV SOLN
INTRAVENOUS | Status: AC
  Filled 2019-09-01: qty 1.2

## 2019-09-01 MED ORDER — DEXAMETHASONE SODIUM PHOSPHATE 10 MG/ML IJ SOLN
INTRAMUSCULAR | Status: AC
  Filled 2019-09-01: qty 1

## 2019-09-01 MED ORDER — FENTANYL CITRATE (PF) 100 MCG/2ML IJ SOLN
INTRAMUSCULAR | Status: AC
  Administered 2019-09-01: 50 ug via INTRAVENOUS
  Filled 2019-09-01: qty 2

## 2019-09-01 MED ORDER — MIDAZOLAM HCL 2 MG/2ML IJ SOLN
INTRAMUSCULAR | Status: AC
  Filled 2019-09-01: qty 2

## 2019-09-01 MED ORDER — FENTANYL CITRATE (PF) 250 MCG/5ML IJ SOLN
INTRAMUSCULAR | Status: AC
  Filled 2019-09-01: qty 5

## 2019-09-01 SURGICAL SUPPLY — 28 items
ADH SKN CLS APL DERMABOND .7 (GAUZE/BANDAGES/DRESSINGS) ×1
ARMBAND PINK RESTRICT EXTREMIT (MISCELLANEOUS) ×3 IMPLANT
CANISTER SUCT 3000ML PPV (MISCELLANEOUS) ×3 IMPLANT
CLIP VESOCCLUDE MED 6/CT (CLIP) ×3 IMPLANT
CLIP VESOCCLUDE SM WIDE 6/CT (CLIP) ×5 IMPLANT
COVER PROBE W GEL 5X96 (DRAPES) ×1 IMPLANT
COVER WAND RF STERILE (DRAPES) ×1 IMPLANT
DERMABOND ADVANCED (GAUZE/BANDAGES/DRESSINGS) ×2
DERMABOND ADVANCED .7 DNX12 (GAUZE/BANDAGES/DRESSINGS) ×1 IMPLANT
ELECT REM PT RETURN 9FT ADLT (ELECTROSURGICAL) ×3
ELECTRODE REM PT RTRN 9FT ADLT (ELECTROSURGICAL) ×1 IMPLANT
GLOVE BIO SURGEON STRL SZ7.5 (GLOVE) ×3 IMPLANT
GOWN STRL REUS W/ TWL LRG LVL3 (GOWN DISPOSABLE) ×2 IMPLANT
GOWN STRL REUS W/ TWL XL LVL3 (GOWN DISPOSABLE) ×1 IMPLANT
GOWN STRL REUS W/TWL LRG LVL3 (GOWN DISPOSABLE) ×6
GOWN STRL REUS W/TWL XL LVL3 (GOWN DISPOSABLE) ×3
KIT BASIN OR (CUSTOM PROCEDURE TRAY) ×3 IMPLANT
KIT TURNOVER KIT B (KITS) ×3 IMPLANT
NS IRRIG 1000ML POUR BTL (IV SOLUTION) ×3 IMPLANT
PACK CV ACCESS (CUSTOM PROCEDURE TRAY) ×3 IMPLANT
PAD ARMBOARD 7.5X6 YLW CONV (MISCELLANEOUS) ×6 IMPLANT
SUT MNCRL AB 4-0 PS2 18 (SUTURE) ×5 IMPLANT
SUT PROLENE 6 0 BV (SUTURE) ×5 IMPLANT
SUT VIC AB 3-0 SH 27 (SUTURE) ×3
SUT VIC AB 3-0 SH 27X BRD (SUTURE) ×1 IMPLANT
TOWEL GREEN STERILE (TOWEL DISPOSABLE) ×3 IMPLANT
UNDERPAD 30X30 (UNDERPADS AND DIAPERS) ×3 IMPLANT
WATER STERILE IRR 1000ML POUR (IV SOLUTION) ×3 IMPLANT

## 2019-09-01 NOTE — Discharge Instructions (Signed)
[facility]  Discharge Instructions  AV Fistula or Graft Surgery for Dialysis Access  Please refer to the following instructions for your post-procedure care. Your surgeon or physician assistant will discuss any changes with you.  Activity  You may drive the day following your surgery, if you are comfortable and no longer taking prescription pain medication. Resume full activity as the soreness in your incision resolves.  Bathing/Showering  You may shower after you go home. Keep your incision dry for 48 hours. Do not soak in a bathtub, hot tub, or swim until the incision heals completely. You may not shower if you have a hemodialysis catheter.  Incision Care  Clean your incision with mild soap and water after 48 hours. Pat the area dry with a clean towel. You do not need a bandage unless otherwise instructed. Do not apply any ointments or creams to your incision. You may have skin glue on your incision. Do not peel it off. It will come off on its own in about one week. Your arm may swell a bit after surgery. To reduce swelling use pillows to elevate your arm so it is above your heart. Your doctor will tell you if you need to lightly wrap your arm with an ACE bandage.  Diet  Resume your normal diet. There are not special food restrictions following this procedure. In order to heal from your surgery, it is CRITICAL to get adequate nutrition. Your body requires vitamins, minerals, and protein. Vegetables are the best source of vitamins and minerals. Vegetables also provide the perfect balance of protein. Processed food has little nutritional value, so try to avoid this.  Medications  Resume taking all of your medications. If your incision is causing pain, you may take over-the counter pain relievers such as acetaminophen (Tylenol). If you were prescribed a stronger pain medication, please be aware these medications can cause nausea and constipation. Prevent  nausea by taking the medication with a snack or meal. Avoid constipation by drinking plenty of fluids and eating foods with high amount of fiber, such as fruits, vegetables, and grains.  Do not take Tylenol if you are taking prescription pain medications.  Follow up Your surgeon may want to see you in the office following your access surgery. If so, this will be arranged at the time of your surgery.  Please call us immediately for any of the following conditions:  . Increased pain, redness, drainage (pus) from your incision site . Fever of 101 degrees or higher . Severe or worsening pain at your incision site . Hand pain or numbness. .  Reduce your risk of vascular disease:  . Stop smoking. If you would like help, call [facility] at 1-800-QUIT-NOW (804) 124-2613) or [place] at 930-215-1625  . Manage your cholesterol . Maintain a desired weight . Control your diabetes . Keep your blood pressure down  Dialysis  It will take several weeks to several months for your new dialysis access to be ready for use. Your surgeon will determine when it is okay to use it. Your nephrologist will continue to direct your dialysis. You can continue to use your Permcath until your new access is ready for use.   09/01/2019 Adam Dennis [id number] [birth year]  Surgeon(s): Waynetta Sandy, MD  Procedure(s): RIGHT UPPER ARM FISTULA  TRANSPOSITION OF RIGHT UPPPER ARM BRACHIAL CEPHALIC FISTULA  x Do not stick fistula until 09/28/2019    If you have any questions, please call the office at [phone].

## 2019-09-01 NOTE — H&P (Signed)
   History and Physical Update  The patient was interviewed and re-examined.  The patient's previous History and Physical has been reviewed and is unchanged from recent office visit. Plan for right arm superficialization vs transposition.   Adam Dennis C. Donzetta Matters, MD [facility] [place] Office: [phone] Pager: [phone]   09/01/2019, 9:52 AM

## 2019-09-01 NOTE — Anesthesia Postprocedure Evaluation (Signed)
Anesthesia Post Note  Patient: Adam Dennis  Procedure(s) Performed: RIGHT UPPER ARM FISTULA  TRANSPOSITION OF RIGHT UPPPER ARM BRACHIAL CEPHALIC FISTULA (Right Arm Upper)     Patient location during evaluation: PACU Anesthesia Type: Regional Level of consciousness: awake and alert Pain management: pain level controlled Vital Signs Assessment: post-procedure vital signs reviewed and stable Respiratory status: spontaneous breathing, nonlabored ventilation, respiratory function stable and patient connected to nasal cannula oxygen Cardiovascular status: stable and blood pressure returned to baseline Postop Assessment: no apparent nausea or vomiting Anesthetic complications: no    Last Vitals:  Vitals:   09/01/19 1136 09/01/19 1150  BP: 116/70 108/87  Pulse: 88 94  Resp: 19 19  Temp: (!) 36.2 C 36.4 C  SpO2: 100% 100%    Last Pain:  Vitals:   09/01/19 1136  PainSc: 0-No pain                 Tykeem Lanzer P Denni France

## 2019-09-01 NOTE — Op Note (Signed)
    Patient name: Adam Dennis MRN: [id number] DOB: 11-30-1960 Sex: male  09/01/2019 Pre-operative Diagnosis: esrd Post-operative diagnosis:  Same Surgeon:  Erlene Quan C. Donzetta Matters, MD Assistant: Leontine Locket, PA Procedure Performed:  Revision of right arm AV fistula with transposition of cephalic vein  Indications: 59 year old male on dialysis via catheter.  He has a cephalic vein AV fistula he is now indicated for revision due to the depth of the fistula.  Findings: Fistula did measure over a centimeter deep in the mid and upper arm.  There was good size measuring approximately a centimeter more peripherally in the arm and more cephalad was somewhat more diminutive but did dilate nicely.  After transposition there was a very strong thrill in the fistula his hand was warm and well-perfused and there was a good radial artery pulse at the wrist.  Fistula should be ready for use in 4 to 6 weeks.   Procedure:  The patient was identified in the holding area and taken to the operating room where is placed supine operative table and MAC anesthesia was induced.  A preoperative block had been placed on his right upper extremity.  Timeout was called.  Block was tested and noted to be intact.  Longitudinal incision was made over the palpable thrill peripherally on the arm.  We dissected out the fistula ligating branches tween clips and ties.  A second incision was made.  We dissected out the fistula for the entirety the 2 incisions we marked for orientation.  I then clamped it first the antecubitum and then more towards the shoulder and transected near the antecubitum.  I tunneled it laterally maintaining orientation.  I flushed with heparinized saline.  I reclamped it and sewed end-to-end with 6-0 Prolene suture.  Prior to completion allowed flushing all directions.  Upon completion there was a very strong thrill in the vein palpable radial artery pulse the wrist.  Satisfied with this we irrigated the wounds obtain  hemostasis and closed the skin layer with 4-0 Monocryl.  Dermabond was placed at the level of the skin.  He was awakened from anesthesia having tolerated procedure without any complication.  All counts were correct at completion.  EBL: 25 cc   Brayn Eckstein C. Donzetta Matters, MD [facility] [place] Office: [phone] Pager: [phone]

## 2019-09-01 NOTE — Anesthesia Procedure Notes (Signed)
Anesthesia Regional Block: Supraclavicular block   Pre-Anesthetic Checklist: ,, timeout performed, Correct Patient, Correct Site, Correct Laterality, Correct Procedure, Correct Position, site marked, Risks and benefits discussed,  Surgical consent,  Pre-op evaluation,  At surgeon's request and post-op pain management  Laterality: Right  Prep: chloraprep       Needles:  Injection technique: Single-shot  Needle Type: Echogenic Stimulator Needle     Needle Length: 10cm  Needle Gauge: 20     Additional Needles:   Procedures:,,,, ultrasound used (permanent image in chart),,,,  Narrative:  Start time: 09/01/2019 9:50 AM End time: 09/01/2019 10:00 AM Injection made incrementally with aspirations every 5 mL.  Performed by: Personally  Anesthesiologist: Murvin Natal, MD  Additional Notes: Functioning IV was confirmed and monitors were applied.  A timeout was performed. Sterile prep, hand hygiene and sterile gloves were used. A 134mm 20ga B Braun echogenic stimulator needle was used. Negative aspiration and negative test dose prior to incremental administration of local anesthetic. The patient tolerated the procedure well.  Ultrasound guidance: relevent anatomy identified, needle position confirmed, local anesthetic spread visualized around nerve(s), vascular puncture avoided.  Image printed for medical record.

## 2019-09-01 NOTE — Progress Notes (Signed)
Orthopedic Tech Progress Note Patient Details:  Adam Dennis 24-Jan-1961 [id number]  Ortho Devices Type of Ortho Device: Arm sling Ortho Device/Splint Location: right Ortho Device/Splint Interventions: Application   Post Interventions Patient Tolerated: Well Instructions Provided: Care of device   [name] 09/01/2019, 11:58 AM

## 2019-09-01 NOTE — Transfer of Care (Signed)
Immediate Anesthesia Transfer of Care Note  Patient: Adam Dennis  Procedure(s) Performed: RIGHT UPPER ARM FISTULA  TRANSPOSITION OF RIGHT UPPPER ARM BRACHIAL CEPHALIC FISTULA (Right Arm Upper)  Patient Location: PACU  Anesthesia Type:MAC and Regional  Level of Consciousness: awake and oriented  Airway & Oxygen Therapy: Patient Spontanous Breathing and Patient connected to nasal cannula oxygen  Post-op Assessment: Report given to RN  Post vital signs: Reviewed and stable  Last Vitals:  Vitals Value Taken Time  BP    Temp    Pulse 88 09/01/19 1135  Resp 21 09/01/19 1135  SpO2 100 % 09/01/19 1135  Vitals shown include unvalidated device data.  Last Pain:  Vitals:   09/01/19 0824  PainSc: 0-No pain      Patients Stated Pain Goal: 2 (62/26/33 3545)  Complications: No apparent anesthesia complications

## 2019-09-01 NOTE — Anesthesia Procedure Notes (Signed)
Procedure Name: MAC Date/Time: 09/01/2019 10:33 AM Performed by: Barrington Ellison, CRNA Pre-anesthesia Checklist: Patient identified, Emergency Drugs available, Suction available and Patient being monitored Patient Re-evaluated:Patient Re-evaluated prior to induction Oxygen Delivery Method: Simple face mask

## 2019-09-02 DIAGNOSIS — N2581 Secondary hyperparathyroidism of renal origin: Secondary | ICD-10-CM | POA: Diagnosis not present

## 2019-09-02 DIAGNOSIS — N186 End stage renal disease: Secondary | ICD-10-CM | POA: Diagnosis not present

## 2019-09-02 DIAGNOSIS — Z992 Dependence on renal dialysis: Secondary | ICD-10-CM | POA: Diagnosis not present

## 2019-09-04 DIAGNOSIS — N186 End stage renal disease: Secondary | ICD-10-CM | POA: Diagnosis not present

## 2019-09-04 DIAGNOSIS — N2581 Secondary hyperparathyroidism of renal origin: Secondary | ICD-10-CM | POA: Diagnosis not present

## 2019-09-04 DIAGNOSIS — Z992 Dependence on renal dialysis: Secondary | ICD-10-CM | POA: Diagnosis not present

## 2019-09-07 DIAGNOSIS — Z992 Dependence on renal dialysis: Secondary | ICD-10-CM | POA: Diagnosis not present

## 2019-09-07 DIAGNOSIS — N186 End stage renal disease: Secondary | ICD-10-CM | POA: Diagnosis not present

## 2019-09-07 DIAGNOSIS — N2581 Secondary hyperparathyroidism of renal origin: Secondary | ICD-10-CM | POA: Diagnosis not present

## 2019-09-09 DIAGNOSIS — N2581 Secondary hyperparathyroidism of renal origin: Secondary | ICD-10-CM | POA: Diagnosis not present

## 2019-09-09 DIAGNOSIS — N186 End stage renal disease: Secondary | ICD-10-CM | POA: Diagnosis not present

## 2019-09-09 DIAGNOSIS — Z992 Dependence on renal dialysis: Secondary | ICD-10-CM | POA: Diagnosis not present

## 2019-09-11 DIAGNOSIS — N186 End stage renal disease: Secondary | ICD-10-CM | POA: Diagnosis not present

## 2019-09-11 DIAGNOSIS — N2581 Secondary hyperparathyroidism of renal origin: Secondary | ICD-10-CM | POA: Diagnosis not present

## 2019-09-11 DIAGNOSIS — Z992 Dependence on renal dialysis: Secondary | ICD-10-CM | POA: Diagnosis not present

## 2019-09-14 DIAGNOSIS — N186 End stage renal disease: Secondary | ICD-10-CM | POA: Diagnosis not present

## 2019-09-14 DIAGNOSIS — Z992 Dependence on renal dialysis: Secondary | ICD-10-CM | POA: Diagnosis not present

## 2019-09-14 DIAGNOSIS — N2581 Secondary hyperparathyroidism of renal origin: Secondary | ICD-10-CM | POA: Diagnosis not present

## 2019-09-16 DIAGNOSIS — Z992 Dependence on renal dialysis: Secondary | ICD-10-CM | POA: Diagnosis not present

## 2019-09-16 DIAGNOSIS — N186 End stage renal disease: Secondary | ICD-10-CM | POA: Diagnosis not present

## 2019-09-16 DIAGNOSIS — N2581 Secondary hyperparathyroidism of renal origin: Secondary | ICD-10-CM | POA: Diagnosis not present

## 2019-09-17 ENCOUNTER — Other Ambulatory Visit: Payer: Self-pay | Admitting: Physician Assistant

## 2019-09-17 DIAGNOSIS — E1169 Type 2 diabetes mellitus with other specified complication: Secondary | ICD-10-CM

## 2019-09-18 DIAGNOSIS — N186 End stage renal disease: Secondary | ICD-10-CM | POA: Diagnosis not present

## 2019-09-18 DIAGNOSIS — N2581 Secondary hyperparathyroidism of renal origin: Secondary | ICD-10-CM | POA: Diagnosis not present

## 2019-09-18 DIAGNOSIS — Z992 Dependence on renal dialysis: Secondary | ICD-10-CM | POA: Diagnosis not present

## 2019-09-21 DIAGNOSIS — Z992 Dependence on renal dialysis: Secondary | ICD-10-CM | POA: Diagnosis not present

## 2019-09-21 DIAGNOSIS — N186 End stage renal disease: Secondary | ICD-10-CM | POA: Diagnosis not present

## 2019-09-21 DIAGNOSIS — N2581 Secondary hyperparathyroidism of renal origin: Secondary | ICD-10-CM | POA: Diagnosis not present

## 2019-09-23 DIAGNOSIS — Z992 Dependence on renal dialysis: Secondary | ICD-10-CM | POA: Diagnosis not present

## 2019-09-23 DIAGNOSIS — N2581 Secondary hyperparathyroidism of renal origin: Secondary | ICD-10-CM | POA: Diagnosis not present

## 2019-09-23 DIAGNOSIS — N186 End stage renal disease: Secondary | ICD-10-CM | POA: Diagnosis not present

## 2019-09-24 DIAGNOSIS — Z992 Dependence on renal dialysis: Secondary | ICD-10-CM | POA: Diagnosis not present

## 2019-09-24 DIAGNOSIS — N186 End stage renal disease: Secondary | ICD-10-CM | POA: Diagnosis not present

## 2019-09-24 DIAGNOSIS — E1122 Type 2 diabetes mellitus with diabetic chronic kidney disease: Secondary | ICD-10-CM | POA: Diagnosis not present

## 2019-09-25 DIAGNOSIS — N186 End stage renal disease: Secondary | ICD-10-CM | POA: Diagnosis not present

## 2019-09-25 DIAGNOSIS — Z992 Dependence on renal dialysis: Secondary | ICD-10-CM | POA: Diagnosis not present

## 2019-09-25 DIAGNOSIS — N2581 Secondary hyperparathyroidism of renal origin: Secondary | ICD-10-CM | POA: Diagnosis not present

## 2019-09-26 ENCOUNTER — Other Ambulatory Visit: Payer: Self-pay | Admitting: Family Medicine

## 2019-09-26 DIAGNOSIS — J438 Other emphysema: Secondary | ICD-10-CM

## 2019-09-28 DIAGNOSIS — Z992 Dependence on renal dialysis: Secondary | ICD-10-CM | POA: Diagnosis not present

## 2019-09-28 DIAGNOSIS — N186 End stage renal disease: Secondary | ICD-10-CM | POA: Diagnosis not present

## 2019-09-28 DIAGNOSIS — N2581 Secondary hyperparathyroidism of renal origin: Secondary | ICD-10-CM | POA: Diagnosis not present

## 2019-09-30 DIAGNOSIS — N2581 Secondary hyperparathyroidism of renal origin: Secondary | ICD-10-CM | POA: Diagnosis not present

## 2019-09-30 DIAGNOSIS — N186 End stage renal disease: Secondary | ICD-10-CM | POA: Diagnosis not present

## 2019-09-30 DIAGNOSIS — Z992 Dependence on renal dialysis: Secondary | ICD-10-CM | POA: Diagnosis not present

## 2019-10-02 DIAGNOSIS — N2581 Secondary hyperparathyroidism of renal origin: Secondary | ICD-10-CM | POA: Diagnosis not present

## 2019-10-02 DIAGNOSIS — N186 End stage renal disease: Secondary | ICD-10-CM | POA: Diagnosis not present

## 2019-10-02 DIAGNOSIS — Z992 Dependence on renal dialysis: Secondary | ICD-10-CM | POA: Diagnosis not present

## 2019-10-05 DIAGNOSIS — N2581 Secondary hyperparathyroidism of renal origin: Secondary | ICD-10-CM | POA: Diagnosis not present

## 2019-10-05 DIAGNOSIS — N186 End stage renal disease: Secondary | ICD-10-CM | POA: Diagnosis not present

## 2019-10-05 DIAGNOSIS — Z992 Dependence on renal dialysis: Secondary | ICD-10-CM | POA: Diagnosis not present

## 2019-10-07 DIAGNOSIS — N2581 Secondary hyperparathyroidism of renal origin: Secondary | ICD-10-CM | POA: Diagnosis not present

## 2019-10-07 DIAGNOSIS — Z992 Dependence on renal dialysis: Secondary | ICD-10-CM | POA: Diagnosis not present

## 2019-10-07 DIAGNOSIS — N186 End stage renal disease: Secondary | ICD-10-CM | POA: Diagnosis not present

## 2019-10-09 DIAGNOSIS — N186 End stage renal disease: Secondary | ICD-10-CM | POA: Diagnosis not present

## 2019-10-09 DIAGNOSIS — N2581 Secondary hyperparathyroidism of renal origin: Secondary | ICD-10-CM | POA: Diagnosis not present

## 2019-10-09 DIAGNOSIS — Z992 Dependence on renal dialysis: Secondary | ICD-10-CM | POA: Diagnosis not present

## 2019-10-12 DIAGNOSIS — N186 End stage renal disease: Secondary | ICD-10-CM | POA: Diagnosis not present

## 2019-10-12 DIAGNOSIS — N2581 Secondary hyperparathyroidism of renal origin: Secondary | ICD-10-CM | POA: Diagnosis not present

## 2019-10-12 DIAGNOSIS — Z992 Dependence on renal dialysis: Secondary | ICD-10-CM | POA: Diagnosis not present

## 2019-10-14 DIAGNOSIS — Z992 Dependence on renal dialysis: Secondary | ICD-10-CM | POA: Diagnosis not present

## 2019-10-14 DIAGNOSIS — N2581 Secondary hyperparathyroidism of renal origin: Secondary | ICD-10-CM | POA: Diagnosis not present

## 2019-10-14 DIAGNOSIS — N186 End stage renal disease: Secondary | ICD-10-CM | POA: Diagnosis not present

## 2019-10-16 DIAGNOSIS — N2581 Secondary hyperparathyroidism of renal origin: Secondary | ICD-10-CM | POA: Diagnosis not present

## 2019-10-16 DIAGNOSIS — N186 End stage renal disease: Secondary | ICD-10-CM | POA: Diagnosis not present

## 2019-10-16 DIAGNOSIS — Z992 Dependence on renal dialysis: Secondary | ICD-10-CM | POA: Diagnosis not present

## 2019-10-18 ENCOUNTER — Telehealth: Payer: Self-pay

## 2019-10-18 NOTE — Telephone Encounter (Signed)
Call from patient inquiring when is it okay to use his fistula. Advised patient per operative note on 09/01/19, Dr. Donzetta Matters advised fistula should be ready for use in 4 to 6 weeks. Patient voiced understanding. Minette Brine, RN

## 2019-10-19 DIAGNOSIS — N186 End stage renal disease: Secondary | ICD-10-CM | POA: Diagnosis not present

## 2019-10-19 DIAGNOSIS — Z992 Dependence on renal dialysis: Secondary | ICD-10-CM | POA: Diagnosis not present

## 2019-10-19 DIAGNOSIS — N2581 Secondary hyperparathyroidism of renal origin: Secondary | ICD-10-CM | POA: Diagnosis not present

## 2019-10-21 DIAGNOSIS — Z992 Dependence on renal dialysis: Secondary | ICD-10-CM | POA: Diagnosis not present

## 2019-10-21 DIAGNOSIS — N186 End stage renal disease: Secondary | ICD-10-CM | POA: Diagnosis not present

## 2019-10-21 DIAGNOSIS — N2581 Secondary hyperparathyroidism of renal origin: Secondary | ICD-10-CM | POA: Diagnosis not present

## 2019-10-23 DIAGNOSIS — N186 End stage renal disease: Secondary | ICD-10-CM | POA: Diagnosis not present

## 2019-10-23 DIAGNOSIS — Z992 Dependence on renal dialysis: Secondary | ICD-10-CM | POA: Diagnosis not present

## 2019-10-23 DIAGNOSIS — N2581 Secondary hyperparathyroidism of renal origin: Secondary | ICD-10-CM | POA: Diagnosis not present

## 2019-10-25 DIAGNOSIS — N186 End stage renal disease: Secondary | ICD-10-CM | POA: Diagnosis not present

## 2019-10-25 DIAGNOSIS — Z992 Dependence on renal dialysis: Secondary | ICD-10-CM | POA: Diagnosis not present

## 2019-10-25 DIAGNOSIS — E1122 Type 2 diabetes mellitus with diabetic chronic kidney disease: Secondary | ICD-10-CM | POA: Diagnosis not present

## 2019-10-26 DIAGNOSIS — N186 End stage renal disease: Secondary | ICD-10-CM | POA: Diagnosis not present

## 2019-10-26 DIAGNOSIS — Z992 Dependence on renal dialysis: Secondary | ICD-10-CM | POA: Diagnosis not present

## 2019-10-26 DIAGNOSIS — N2581 Secondary hyperparathyroidism of renal origin: Secondary | ICD-10-CM | POA: Diagnosis not present

## 2019-10-28 DIAGNOSIS — N186 End stage renal disease: Secondary | ICD-10-CM | POA: Diagnosis not present

## 2019-10-28 DIAGNOSIS — N2581 Secondary hyperparathyroidism of renal origin: Secondary | ICD-10-CM | POA: Diagnosis not present

## 2019-10-28 DIAGNOSIS — Z992 Dependence on renal dialysis: Secondary | ICD-10-CM | POA: Diagnosis not present

## 2019-10-30 DIAGNOSIS — N186 End stage renal disease: Secondary | ICD-10-CM | POA: Diagnosis not present

## 2019-10-30 DIAGNOSIS — Z992 Dependence on renal dialysis: Secondary | ICD-10-CM | POA: Diagnosis not present

## 2019-10-30 DIAGNOSIS — N2581 Secondary hyperparathyroidism of renal origin: Secondary | ICD-10-CM | POA: Diagnosis not present

## 2019-11-01 DIAGNOSIS — G4733 Obstructive sleep apnea (adult) (pediatric): Secondary | ICD-10-CM | POA: Diagnosis not present

## 2019-11-01 DIAGNOSIS — I10 Essential (primary) hypertension: Secondary | ICD-10-CM | POA: Diagnosis not present

## 2019-11-01 DIAGNOSIS — I5042 Chronic combined systolic (congestive) and diastolic (congestive) heart failure: Secondary | ICD-10-CM | POA: Diagnosis not present

## 2019-11-01 DIAGNOSIS — E785 Hyperlipidemia, unspecified: Secondary | ICD-10-CM | POA: Diagnosis not present

## 2019-11-01 DIAGNOSIS — N189 Chronic kidney disease, unspecified: Secondary | ICD-10-CM | POA: Diagnosis not present

## 2019-11-01 DIAGNOSIS — M109 Gout, unspecified: Secondary | ICD-10-CM | POA: Diagnosis not present

## 2019-11-02 ENCOUNTER — Other Ambulatory Visit: Payer: Self-pay | Admitting: Family Medicine

## 2019-11-02 DIAGNOSIS — N2581 Secondary hyperparathyroidism of renal origin: Secondary | ICD-10-CM | POA: Diagnosis not present

## 2019-11-02 DIAGNOSIS — M1A00X0 Idiopathic chronic gout, unspecified site, without tophus (tophi): Secondary | ICD-10-CM

## 2019-11-02 DIAGNOSIS — Z992 Dependence on renal dialysis: Secondary | ICD-10-CM | POA: Diagnosis not present

## 2019-11-02 DIAGNOSIS — N186 End stage renal disease: Secondary | ICD-10-CM | POA: Diagnosis not present

## 2019-11-04 DIAGNOSIS — N186 End stage renal disease: Secondary | ICD-10-CM | POA: Diagnosis not present

## 2019-11-04 DIAGNOSIS — N2581 Secondary hyperparathyroidism of renal origin: Secondary | ICD-10-CM | POA: Diagnosis not present

## 2019-11-04 DIAGNOSIS — Z992 Dependence on renal dialysis: Secondary | ICD-10-CM | POA: Diagnosis not present

## 2019-11-06 DIAGNOSIS — N186 End stage renal disease: Secondary | ICD-10-CM | POA: Diagnosis not present

## 2019-11-06 DIAGNOSIS — Z992 Dependence on renal dialysis: Secondary | ICD-10-CM | POA: Diagnosis not present

## 2019-11-06 DIAGNOSIS — N2581 Secondary hyperparathyroidism of renal origin: Secondary | ICD-10-CM | POA: Diagnosis not present

## 2019-11-08 ENCOUNTER — Telehealth: Payer: Self-pay

## 2019-11-08 NOTE — Telephone Encounter (Signed)
Telephone message eceived from patient reporting right arm with access became swollen and sore when used at dialysis. Call placed to patient. LMOM to return call. Minette Brine, RN

## 2019-11-09 DIAGNOSIS — N186 End stage renal disease: Secondary | ICD-10-CM | POA: Diagnosis not present

## 2019-11-09 DIAGNOSIS — N2581 Secondary hyperparathyroidism of renal origin: Secondary | ICD-10-CM | POA: Diagnosis not present

## 2019-11-09 DIAGNOSIS — Z992 Dependence on renal dialysis: Secondary | ICD-10-CM | POA: Diagnosis not present

## 2019-11-11 DIAGNOSIS — N186 End stage renal disease: Secondary | ICD-10-CM | POA: Diagnosis not present

## 2019-11-11 DIAGNOSIS — N2581 Secondary hyperparathyroidism of renal origin: Secondary | ICD-10-CM | POA: Diagnosis not present

## 2019-11-11 DIAGNOSIS — Z992 Dependence on renal dialysis: Secondary | ICD-10-CM | POA: Diagnosis not present

## 2019-11-12 DIAGNOSIS — N186 End stage renal disease: Secondary | ICD-10-CM | POA: Diagnosis not present

## 2019-11-12 DIAGNOSIS — Z992 Dependence on renal dialysis: Secondary | ICD-10-CM | POA: Diagnosis not present

## 2019-11-12 DIAGNOSIS — I871 Compression of vein: Secondary | ICD-10-CM | POA: Diagnosis not present

## 2019-11-13 DIAGNOSIS — N2581 Secondary hyperparathyroidism of renal origin: Secondary | ICD-10-CM | POA: Diagnosis not present

## 2019-11-13 DIAGNOSIS — N186 End stage renal disease: Secondary | ICD-10-CM | POA: Diagnosis not present

## 2019-11-13 DIAGNOSIS — Z992 Dependence on renal dialysis: Secondary | ICD-10-CM | POA: Diagnosis not present

## 2019-11-16 DIAGNOSIS — Z992 Dependence on renal dialysis: Secondary | ICD-10-CM | POA: Diagnosis not present

## 2019-11-16 DIAGNOSIS — N2581 Secondary hyperparathyroidism of renal origin: Secondary | ICD-10-CM | POA: Diagnosis not present

## 2019-11-16 DIAGNOSIS — N186 End stage renal disease: Secondary | ICD-10-CM | POA: Diagnosis not present

## 2019-11-18 DIAGNOSIS — N186 End stage renal disease: Secondary | ICD-10-CM | POA: Diagnosis not present

## 2019-11-18 DIAGNOSIS — Z992 Dependence on renal dialysis: Secondary | ICD-10-CM | POA: Diagnosis not present

## 2019-11-18 DIAGNOSIS — N2581 Secondary hyperparathyroidism of renal origin: Secondary | ICD-10-CM | POA: Diagnosis not present

## 2019-11-20 ENCOUNTER — Other Ambulatory Visit: Payer: Self-pay | Admitting: Internal Medicine

## 2019-11-20 ENCOUNTER — Other Ambulatory Visit: Payer: Self-pay | Admitting: Family Medicine

## 2019-11-20 DIAGNOSIS — N186 End stage renal disease: Secondary | ICD-10-CM | POA: Diagnosis not present

## 2019-11-20 DIAGNOSIS — N2581 Secondary hyperparathyroidism of renal origin: Secondary | ICD-10-CM | POA: Diagnosis not present

## 2019-11-20 DIAGNOSIS — Z992 Dependence on renal dialysis: Secondary | ICD-10-CM | POA: Diagnosis not present

## 2019-11-22 ENCOUNTER — Ambulatory Visit: Payer: Medicare HMO | Admitting: Family Medicine

## 2019-11-23 DIAGNOSIS — Z992 Dependence on renal dialysis: Secondary | ICD-10-CM | POA: Diagnosis not present

## 2019-11-23 DIAGNOSIS — N2581 Secondary hyperparathyroidism of renal origin: Secondary | ICD-10-CM | POA: Diagnosis not present

## 2019-11-23 DIAGNOSIS — N186 End stage renal disease: Secondary | ICD-10-CM | POA: Diagnosis not present

## 2019-11-24 ENCOUNTER — Ambulatory Visit: Payer: Medicare HMO | Attending: Family Medicine | Admitting: Family Medicine

## 2019-11-24 ENCOUNTER — Encounter: Payer: Self-pay | Admitting: Family Medicine

## 2019-11-24 ENCOUNTER — Other Ambulatory Visit: Payer: Self-pay

## 2019-11-24 VITALS — BP 118/75 | HR 77 | Ht 71.0 in | Wt 315.0 lb

## 2019-11-24 DIAGNOSIS — E669 Obesity, unspecified: Secondary | ICD-10-CM

## 2019-11-24 DIAGNOSIS — E1122 Type 2 diabetes mellitus with diabetic chronic kidney disease: Secondary | ICD-10-CM | POA: Diagnosis not present

## 2019-11-24 DIAGNOSIS — I1311 Hypertensive heart and chronic kidney disease without heart failure, with stage 5 chronic kidney disease, or end stage renal disease: Secondary | ICD-10-CM

## 2019-11-24 DIAGNOSIS — E1169 Type 2 diabetes mellitus with other specified complication: Secondary | ICD-10-CM | POA: Diagnosis not present

## 2019-11-24 DIAGNOSIS — F0631 Mood disorder due to known physiological condition with depressive features: Secondary | ICD-10-CM

## 2019-11-24 DIAGNOSIS — R252 Cramp and spasm: Secondary | ICD-10-CM

## 2019-11-24 DIAGNOSIS — J438 Other emphysema: Secondary | ICD-10-CM

## 2019-11-24 DIAGNOSIS — N186 End stage renal disease: Secondary | ICD-10-CM

## 2019-11-24 DIAGNOSIS — Z992 Dependence on renal dialysis: Secondary | ICD-10-CM | POA: Diagnosis not present

## 2019-11-24 DIAGNOSIS — Z6841 Body Mass Index (BMI) 40.0 and over, adult: Secondary | ICD-10-CM | POA: Diagnosis not present

## 2019-11-24 LAB — POCT GLYCOSYLATED HEMOGLOBIN (HGB A1C): HbA1c, POC (controlled diabetic range): 5.5 % (ref 0.0–7.0)

## 2019-11-24 MED ORDER — SERTRALINE HCL 25 MG PO TABS: 25.0000 mg | ORAL_TABLET | Freq: Every day | ORAL | 1 refills | Status: DC

## 2019-11-24 MED ORDER — METHOCARBAMOL 500 MG PO TABS: 500.0000 mg | ORAL_TABLET | Freq: Two times a day (BID) | ORAL | 1 refills | Status: DC | PRN

## 2019-11-24 NOTE — Patient Instructions (Signed)

## 2019-11-24 NOTE — Progress Notes (Signed)
Having pain in left leg. 

## 2019-11-24 NOTE — Progress Notes (Signed)
Subjective:  Patient ID: Adam Dennis, male    DOB: [birth year]  Age: 59 y.o. MRN: [id number]  CC: Hypertension   HPI Adam Dennis is a 59 year old male with a history of hypertension,ESRD on HD (on T, Th, Sat),obstructive sleep apnea (on CPAP at night), type 2 diabetes mellitus (A1c 6.2diet controlled), combined systolic and diastolic CHF (EF 93-23% from echo of 06/2019) , obesity hypoventilation syndrome previously on 4 L of oxygen for chronic respiratory failure (no longer requires oxygen),Emphysema who presents today for an office visit.  He has had L thigh spasms rated as a 5/10 which have been intermittently for the last couple of months but is absent now. There are no no known trigerrs/alleviating/aggravating factors. He cramps up  after dialysis he also adds.. His breathing is good.  He walks every once in a while. Fasting blood sugar this morning was 89 and he has no hypoglycemic episode. Denies neuropathy symptoms. Once in a while gets blurry vision and thinks he it is because he needs to get glasses as he received his prescription 3 months ago at his eye exam. His depression is controlled on Zoloft Past Medical History:  Diagnosis Date  . Arthritis    "back" (02/06/2017)  . CHF (congestive heart failure) ([place])    new onset /Encounter Date 09/12/2006  . Chronic combined systolic and diastolic heart failure ([place])   . CKD (chronic kidney disease) stage 4, GFR 15-29 ml/min (HCC)   . COPD (chronic obstructive pulmonary disease) ([place])   . End stage renal disease on dialysis [facility])    Tues/ Thur/ [address]  . Glaucoma, right eye   . High cholesterol   . Hypertension   . Hypertrophy of tonsils alone   . Myocardial infarction [facility])    "small one; ~ 2008" (02/06/2017)  . Obesity, unspecified   . On home oxygen therapy    "4L; 24/7" (04/10/2017)  . OSA on CPAP   . Type II diabetes mellitus ([place])     Past Surgical History:  Procedure Laterality Date  . AV  FISTULA PLACEMENT Right 07/01/2019   Procedure: Right arm arteriovenous fistual;  Surgeon: Waynetta Sandy, MD;  Location: [place];  Service: Vascular;  Laterality: Right;  . FISTULA SUPERFICIALIZATION Right 09/01/2019   Procedure: RIGHT UPPER ARM FISTULA  TRANSPOSITION OF RIGHT UPPPER ARM BRACHIAL CEPHALIC FISTULA;  Surgeon: Waynetta Sandy, MD;  Location: [place];  Service: Vascular;  Laterality: Right;  . HERNIA REPAIR    . INSERTION OF DIALYSIS CATHETER Right 07/01/2019   Procedure: INSERTION OF TUNNELED DIALYSIS CATHETER;  Surgeon: Waynetta Sandy, MD;  Location: [place];  Service: Vascular;  Laterality: Right;  . TEE WITHOUT CARDIOVERSION N/A 04/16/2017   Procedure: TRANSESOPHAGEAL ECHOCARDIOGRAM (TEE);  Surgeon: [name], MD;  Location: [facility] ENDOSCOPY;  Service: Cardiovascular;  Laterality: N/A;  . UMBILICAL HERNIA REPAIR  1960s   "when I was little baby"    Family History  Problem Relation Age of Onset  . Hypertension Mother   . Diabetes Brother   . Diabetes Sister   . Cancer Maternal Aunt   . Amblyopia Neg Hx   . Blindness Neg Hx   . Cataracts Neg Hx   . Glaucoma Neg Hx   . Macular degeneration Neg Hx   . Strabismus Neg Hx   . Retinal detachment Neg Hx   . Retinitis pigmentosa Neg Hx     No Known Allergies  Outpatient Medications Prior to Visit  Medication  Sig Dispense Refill  . ACCU-CHEK SOFTCLIX LANCETS lancets Use as instructed to check blood sugar up to 3 times daily. 100 each 11  . acetaminophen (TYLENOL) 500 MG tablet Take 500 mg by mouth every 6 (six) hours as needed for moderate pain or headache.    . albuterol (VENTOLIN HFA) 108 (90 Base) MCG/ACT inhaler INHALE 2 PUFFS INTO THE LUNGS EVERY 6 HOURS AS NEEDED FOR WHEEZING OR SHORTNESS OF BREATH 54 g 0  . amLODipine (NORVASC) 5 MG tablet Take 1 tablet (5 mg total) by mouth daily. (Patient taking differently: Take 5 mg by mouth at bedtime. ) 90 tablet 1  . aspirin EC 81 MG tablet Take 81 mg  by mouth daily.    Marland Kitchen atorvastatin (LIPITOR) 40 MG tablet TAKE 1 TABLET(40 MG) BY MOUTH DAILY 90 tablet 0  . Blood Glucose Monitoring Suppl (ACCU-CHEK AVIVA) device Use as instructed to check blood sugar up to 3 times daily. 1 each 0  . calcium carbonate (TUMS - DOSED IN MG ELEMENTAL CALCIUM) 500 MG chewable tablet Chew 1 tablet (200 mg of elemental calcium total) by mouth 2 (two) times daily. (Patient taking differently: Chew 500 mg by mouth 2 (two) times daily. )    . cholecalciferol (VITAMIN D3) 25 MCG (1000 UNIT) tablet Take 1,000 Units by mouth daily.    . Fluticasone-Salmeterol (ADVAIR DISKUS) 250-50 MCG/DOSE AEPB Inhale 1 puff into the lungs 2 (two) times daily. (Patient taking differently: Inhale 1 puff into the lungs daily. ) 1 each 6  . glucose blood (ACCU-CHEK AVIVA PLUS) test strip Use as instructed to check blood sugar up to 3 times daily. 100 each 11  . Lancet Devices (ACCU-CHEK SOFTCLIX) lancets Use as instructed daily. 1 each 5  . lidocaine-prilocaine (EMLA) cream Apply 1 application topically as needed (topical anesthesia for hemodialysis if Gebauers and Lidocaine injection are ineffective.). 30 g 0  . Multiple Vitamin (MULTIVITAMIN ADULT) TABS Take 1 tablet by mouth daily. (Patient taking differently: Take 1 tablet by mouth at bedtime. ) 30 tablet 3  . nitroGLYCERIN (NITROSTAT) 0.4 MG SL tablet PLACE 1 TABLET BY MOUTH UNDER TONGUE EVERY 5 MINUTES FOR 3 DOSES AS NEEDED FOR CHEST PAIN (Patient taking differently: Place 0.4 mg under the tongue every 5 (five) minutes as needed for chest pain. ) 25 tablet 0  . oxyCODONE-acetaminophen (PERCOCET) 5-325 MG tablet Take 1 tablet by mouth every 6 (six) hours as needed. 15 tablet 0  . timolol (TIMOPTIC-XR) 0.5 % ophthalmic gel-forming Place 1 drop into both eyes daily.     . TRAVATAN Z 0.004 % SOLN ophthalmic solution Place 1 drop into both eyes at bedtime.  12  . ferrous sulfate 325 (65 FE) MG tablet Take 1 tablet (325 mg total) by mouth daily  with breakfast. (Patient not taking: Reported on 08/25/2019) 100 tablet 3  . hydrALAZINE (APRESOLINE) 25 MG tablet TAKE 1 TABLET(25 MG) BY MOUTH THREE TIMES DAILY (Patient not taking: Reported on 11/24/2019) 90 tablet 0  . sertraline (ZOLOFT) 25 MG tablet Take 1 tablet (25 mg total) by mouth daily. 30 tablet 3   No facility-administered medications prior to visit.     ROS Review of Systems  Constitutional: Negative for activity change and appetite change.  HENT: Negative for sinus pressure and sore throat.   Eyes: Negative for visual disturbance.  Respiratory: Negative for cough, chest tightness and shortness of breath.   Cardiovascular: Negative for chest pain and leg swelling.  Gastrointestinal: Negative for abdominal distention, abdominal  pain, constipation and diarrhea.  Endocrine: Negative.   Genitourinary: Negative for dysuria.  Musculoskeletal:       See HPI  Skin: Negative for rash.  Allergic/Immunologic: Negative.   Neurological: Negative for weakness, light-headedness and numbness.  Psychiatric/Behavioral: Negative for dysphoric mood and suicidal ideas.    Objective:  BP 118/75   Pulse 77   Ht 5\' 11"  (1.803 m)   Wt (!) 315 lb (142.9 kg)   SpO2 100%   BMI 43.93 kg/m   BP/Weight 11/24/2019 09/01/2019 01/04/9146  Systolic BP 829 562 130  Diastolic BP 75 87 94  Wt. (Lbs) 315 309 318.2  BMI 43.93 43.1 44.38      Physical Exam Constitutional:      Appearance: He is well-developed.  Neck:     Vascular: No JVD.  Cardiovascular:     Rate and Rhythm: Normal rate.     Heart sounds: Normal heart sounds. No murmur heard.   Pulmonary:     Effort: Pulmonary effort is normal.     Breath sounds: Normal breath sounds. No wheezing or rales.  Chest:     Chest wall: No tenderness.  Abdominal:     General: Bowel sounds are normal. There is no distension.     Palpations: Abdomen is soft. There is no mass.     Tenderness: There is no abdominal tenderness.  Musculoskeletal:         General: Normal range of motion.     Right lower leg: No edema.     Left lower leg: No edema.     Comments: Normal appearance of both thighs and no tenderness on palpation  Neurological:     Mental Status: He is alert and oriented to person, place, and time.  Psychiatric:        Mood and Affect: Mood normal.     CMP Latest Ref Rng & Units 09/01/2019 07/06/2019 07/04/2019  Glucose 70 - 99 mg/dL 91 104(H) 96  BUN 6 - 20 mg/dL 30(H) 57(H) 35(H)  Creatinine 0.61 - 1.24 mg/dL 6.20(H) 7.58(H) 5.52(H)  Sodium 135 - 145 mmol/L 137 136 136  Potassium 3.5 - 5.1 mmol/L 4.4 3.7 4.2  Chloride 98 - 111 mmol/L 96(L) 94(L) 95(L)  CO2 22 - 32 mmol/L - 28 26  Calcium 8.9 - 10.3 mg/dL - 8.6(L) 8.5(L)  Total Protein 6.5 - 8.1 g/dL - - -  Total Bilirubin 0.3 - 1.2 mg/dL - - -  Alkaline Phos 38 - 126 U/L - - -  AST 15 - 41 U/L - - -  ALT 0 - 44 U/L - - -    Lipid Panel     Component Value Date/Time   CHOL 119 03/24/2019 1017   TRIG 59 03/24/2019 1017   HDL 51 03/24/2019 1017   CHOLHDL 2.3 03/24/2019 1017   CHOLHDL 2.8 10/07/2014 1547   VLDL 13 10/07/2014 1547   LDLCALC 55 03/24/2019 1017    CBC    Component Value Date/Time   WBC 8.9 07/06/2019 0754   RBC 4.81 07/06/2019 0754   HGB 13.6 09/01/2019 0826   HGB 12.8 (L) 03/24/2019 1017   HCT 40.0 09/01/2019 0826   HCT 39.7 03/24/2019 1017   PLT 186 07/06/2019 0754   PLT 191 03/24/2019 1017   MCV 91.3 07/06/2019 0754   MCV 87 03/24/2019 1017   MCH 28.3 07/06/2019 0754   MCHC 31.0 07/06/2019 0754   RDW 16.5 (H) 07/06/2019 0754   RDW 13.9 03/24/2019 1017   LYMPHSABS  1.3 03/24/2019 1017   MONOABS 0.8 04/28/2017 1236   EOSABS 0.0 03/24/2019 1017   BASOSABS 0.0 03/24/2019 1017    Lab Results  Component Value Date   HGBA1C 5.5 11/24/2019     Assessment & Plan:   1. Diabetes mellitus type 2 in obese (HCC) Diet controlled with A1c of 5.5 Counseled on Diabetic diet, my plate method, 035 minutes of moderate intensity  exercise/week Blood sugar logs with fasting goals of 80-120 mg/dl, random of less than 180 and in the event of sugars less than 60 mg/dl or greater than 400 mg/dl encouraged to notify the clinic. Advised on the need for annual eye exams, annual foot exams, Pneumonia vaccine. - POCT glycosylated hemoglobin (Hb A1C)  2. Muscle cramp Unknown etiology Could be related to hemodialysis sessions Advised to stay hydrated but would need to abide by fluid restriction per nephrology Trial of Robaxin to be used as needed - methocarbamol (ROBAXIN) 500 MG tablet; Take 1 tablet (500 mg total) by mouth 2 (two) times daily as needed for muscle spasms.  Dispense: 60 tablet; Refill: 1  3. 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 Other emphysema ([place]) Stable with no exacerbations Continue Advair  5. Depression due to physical illness Controlled - sertraline (ZOLOFT) 25 MG tablet; Take 1 tablet (25 mg total) by mouth daily.  Dispense: 90 tablet; Refill: 1    Charlott Rakes, MD, FAAFP. [facility] and [place], [place]   11/24/2019, 9:43 AM

## 2019-11-25 DIAGNOSIS — N186 End stage renal disease: Secondary | ICD-10-CM | POA: Diagnosis not present

## 2019-11-25 DIAGNOSIS — Z992 Dependence on renal dialysis: Secondary | ICD-10-CM | POA: Diagnosis not present

## 2019-11-25 DIAGNOSIS — N2581 Secondary hyperparathyroidism of renal origin: Secondary | ICD-10-CM | POA: Diagnosis not present

## 2019-11-27 DIAGNOSIS — N2581 Secondary hyperparathyroidism of renal origin: Secondary | ICD-10-CM | POA: Diagnosis not present

## 2019-11-27 DIAGNOSIS — N186 End stage renal disease: Secondary | ICD-10-CM | POA: Diagnosis not present

## 2019-11-27 DIAGNOSIS — Z992 Dependence on renal dialysis: Secondary | ICD-10-CM | POA: Diagnosis not present

## 2019-11-30 DIAGNOSIS — N2581 Secondary hyperparathyroidism of renal origin: Secondary | ICD-10-CM | POA: Diagnosis not present

## 2019-11-30 DIAGNOSIS — N186 End stage renal disease: Secondary | ICD-10-CM | POA: Diagnosis not present

## 2019-11-30 DIAGNOSIS — Z992 Dependence on renal dialysis: Secondary | ICD-10-CM | POA: Diagnosis not present

## 2019-12-02 DIAGNOSIS — E662 Morbid (severe) obesity with alveolar hypoventilation: Secondary | ICD-10-CM | POA: Diagnosis not present

## 2019-12-02 DIAGNOSIS — J9610 Chronic respiratory failure, unspecified whether with hypoxia or hypercapnia: Secondary | ICD-10-CM | POA: Diagnosis not present

## 2019-12-02 DIAGNOSIS — J449 Chronic obstructive pulmonary disease, unspecified: Secondary | ICD-10-CM | POA: Diagnosis not present

## 2019-12-02 DIAGNOSIS — N186 End stage renal disease: Secondary | ICD-10-CM | POA: Diagnosis not present

## 2019-12-02 DIAGNOSIS — Z992 Dependence on renal dialysis: Secondary | ICD-10-CM | POA: Diagnosis not present

## 2019-12-02 DIAGNOSIS — N2581 Secondary hyperparathyroidism of renal origin: Secondary | ICD-10-CM | POA: Diagnosis not present

## 2019-12-04 DIAGNOSIS — N186 End stage renal disease: Secondary | ICD-10-CM | POA: Diagnosis not present

## 2019-12-04 DIAGNOSIS — Z992 Dependence on renal dialysis: Secondary | ICD-10-CM | POA: Diagnosis not present

## 2019-12-04 DIAGNOSIS — N2581 Secondary hyperparathyroidism of renal origin: Secondary | ICD-10-CM | POA: Diagnosis not present

## 2019-12-07 DIAGNOSIS — N2581 Secondary hyperparathyroidism of renal origin: Secondary | ICD-10-CM | POA: Diagnosis not present

## 2019-12-07 DIAGNOSIS — N186 End stage renal disease: Secondary | ICD-10-CM | POA: Diagnosis not present

## 2019-12-07 DIAGNOSIS — Z992 Dependence on renal dialysis: Secondary | ICD-10-CM | POA: Diagnosis not present

## 2019-12-09 DIAGNOSIS — N2581 Secondary hyperparathyroidism of renal origin: Secondary | ICD-10-CM | POA: Diagnosis not present

## 2019-12-09 DIAGNOSIS — Z992 Dependence on renal dialysis: Secondary | ICD-10-CM | POA: Diagnosis not present

## 2019-12-09 DIAGNOSIS — N186 End stage renal disease: Secondary | ICD-10-CM | POA: Diagnosis not present

## 2019-12-11 DIAGNOSIS — N186 End stage renal disease: Secondary | ICD-10-CM | POA: Diagnosis not present

## 2019-12-11 DIAGNOSIS — Z992 Dependence on renal dialysis: Secondary | ICD-10-CM | POA: Diagnosis not present

## 2019-12-11 DIAGNOSIS — N2581 Secondary hyperparathyroidism of renal origin: Secondary | ICD-10-CM | POA: Diagnosis not present

## 2019-12-14 ENCOUNTER — Other Ambulatory Visit: Payer: Self-pay | Admitting: Physician Assistant

## 2019-12-14 ENCOUNTER — Other Ambulatory Visit: Payer: Self-pay | Admitting: Family Medicine

## 2019-12-14 DIAGNOSIS — N186 End stage renal disease: Secondary | ICD-10-CM | POA: Diagnosis not present

## 2019-12-14 DIAGNOSIS — I10 Essential (primary) hypertension: Secondary | ICD-10-CM

## 2019-12-14 DIAGNOSIS — I5042 Chronic combined systolic (congestive) and diastolic (congestive) heart failure: Secondary | ICD-10-CM

## 2019-12-14 DIAGNOSIS — E669 Obesity, unspecified: Secondary | ICD-10-CM

## 2019-12-14 DIAGNOSIS — N2581 Secondary hyperparathyroidism of renal origin: Secondary | ICD-10-CM | POA: Diagnosis not present

## 2019-12-14 DIAGNOSIS — Z992 Dependence on renal dialysis: Secondary | ICD-10-CM | POA: Diagnosis not present

## 2019-12-14 NOTE — Telephone Encounter (Signed)
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 ([place])   [place], Charlane Ferretti, MD   4 months ago 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 ([place])   [place], Charlane Ferretti, MD   8 months ago Diabetes mellitus type 2 in obese [facility])   [place], [place], [place]   1 year ago Chronic combined systolic and diastolic CHF (congestive heart failure) ([place])   [facility] [place], Charlane Ferretti, MD      Future Appointments            In 5 months Charlott Rakes, MD [place]            atorvastatin (LIPITOR) 40 MG tablet [Pharmacy Med Name: ATORVASTATIN 40MG  TABLETS] 90 tablet 0    Sig: TAKE 1 TABLET(40 MG) BY MOUTH DAILY     Cardiovascular:  Antilipid - Statins Failed - 12/14/2019  6:36 AM      Failed - LDL in normal range and within 360 days    LDL Chol Calc (NIH)  Date Value Ref Range Status  03/24/2019 55 0 - 99 mg/dL Final         Passed - Total Cholesterol in normal range and within 360 days    Cholesterol, Total  Date Value Ref Range Status  03/24/2019 119 100 - 199 mg/dL Final         Passed - HDL in normal range and within 360 days    HDL  Date Value Ref Range Status  03/24/2019 51 >39 mg/dL Final          Passed - Triglycerides in normal range and within 360 days    Triglycerides  Date Value Ref Range Status  03/24/2019 59 0 - 149 mg/dL Final         Passed - Patient is not pregnant      Passed - Valid encounter within last 12 months    Recent Outpatient Visits          2 weeks ago Diabetes mellitus type 2 in obese 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

## 2019-12-16 DIAGNOSIS — N2581 Secondary hyperparathyroidism of renal origin: Secondary | ICD-10-CM | POA: Diagnosis not present

## 2019-12-16 DIAGNOSIS — Z992 Dependence on renal dialysis: Secondary | ICD-10-CM | POA: Diagnosis not present

## 2019-12-16 DIAGNOSIS — N186 End stage renal disease: Secondary | ICD-10-CM | POA: Diagnosis not present

## 2019-12-18 DIAGNOSIS — N2581 Secondary hyperparathyroidism of renal origin: Secondary | ICD-10-CM | POA: Diagnosis not present

## 2019-12-18 DIAGNOSIS — Z992 Dependence on renal dialysis: Secondary | ICD-10-CM | POA: Diagnosis not present

## 2019-12-18 DIAGNOSIS — N186 End stage renal disease: Secondary | ICD-10-CM | POA: Diagnosis not present

## 2019-12-21 DIAGNOSIS — N2581 Secondary hyperparathyroidism of renal origin: Secondary | ICD-10-CM | POA: Diagnosis not present

## 2019-12-21 DIAGNOSIS — N186 End stage renal disease: Secondary | ICD-10-CM | POA: Diagnosis not present

## 2019-12-21 DIAGNOSIS — Z992 Dependence on renal dialysis: Secondary | ICD-10-CM | POA: Diagnosis not present

## 2019-12-23 DIAGNOSIS — N186 End stage renal disease: Secondary | ICD-10-CM | POA: Diagnosis not present

## 2019-12-23 DIAGNOSIS — N2581 Secondary hyperparathyroidism of renal origin: Secondary | ICD-10-CM | POA: Diagnosis not present

## 2019-12-23 DIAGNOSIS — Z992 Dependence on renal dialysis: Secondary | ICD-10-CM | POA: Diagnosis not present

## 2019-12-25 DIAGNOSIS — N186 End stage renal disease: Secondary | ICD-10-CM | POA: Diagnosis not present

## 2019-12-25 DIAGNOSIS — E1122 Type 2 diabetes mellitus with diabetic chronic kidney disease: Secondary | ICD-10-CM | POA: Diagnosis not present

## 2019-12-25 DIAGNOSIS — Z992 Dependence on renal dialysis: Secondary | ICD-10-CM | POA: Diagnosis not present

## 2019-12-25 DIAGNOSIS — N2581 Secondary hyperparathyroidism of renal origin: Secondary | ICD-10-CM | POA: Diagnosis not present

## 2019-12-28 DIAGNOSIS — N186 End stage renal disease: Secondary | ICD-10-CM | POA: Diagnosis not present

## 2019-12-28 DIAGNOSIS — N2581 Secondary hyperparathyroidism of renal origin: Secondary | ICD-10-CM | POA: Diagnosis not present

## 2019-12-28 DIAGNOSIS — Z992 Dependence on renal dialysis: Secondary | ICD-10-CM | POA: Diagnosis not present

## 2019-12-30 DIAGNOSIS — N2581 Secondary hyperparathyroidism of renal origin: Secondary | ICD-10-CM | POA: Diagnosis not present

## 2019-12-30 DIAGNOSIS — N186 End stage renal disease: Secondary | ICD-10-CM | POA: Diagnosis not present

## 2019-12-30 DIAGNOSIS — Z992 Dependence on renal dialysis: Secondary | ICD-10-CM | POA: Diagnosis not present

## 2019-12-31 DIAGNOSIS — Z992 Dependence on renal dialysis: Secondary | ICD-10-CM | POA: Diagnosis not present

## 2019-12-31 DIAGNOSIS — I871 Compression of vein: Secondary | ICD-10-CM | POA: Diagnosis not present

## 2019-12-31 DIAGNOSIS — N186 End stage renal disease: Secondary | ICD-10-CM | POA: Diagnosis not present

## 2019-12-31 DIAGNOSIS — T82858A Stenosis of vascular prosthetic devices, implants and grafts, initial encounter: Secondary | ICD-10-CM | POA: Diagnosis not present

## 2020-01-01 DIAGNOSIS — N2581 Secondary hyperparathyroidism of renal origin: Secondary | ICD-10-CM | POA: Diagnosis not present

## 2020-01-01 DIAGNOSIS — Z992 Dependence on renal dialysis: Secondary | ICD-10-CM | POA: Diagnosis not present

## 2020-01-01 DIAGNOSIS — N186 End stage renal disease: Secondary | ICD-10-CM | POA: Diagnosis not present

## 2020-01-04 DIAGNOSIS — Z992 Dependence on renal dialysis: Secondary | ICD-10-CM | POA: Diagnosis not present

## 2020-01-04 DIAGNOSIS — N2581 Secondary hyperparathyroidism of renal origin: Secondary | ICD-10-CM | POA: Diagnosis not present

## 2020-01-04 DIAGNOSIS — N186 End stage renal disease: Secondary | ICD-10-CM | POA: Diagnosis not present

## 2020-01-06 DIAGNOSIS — Z992 Dependence on renal dialysis: Secondary | ICD-10-CM | POA: Diagnosis not present

## 2020-01-06 DIAGNOSIS — N2581 Secondary hyperparathyroidism of renal origin: Secondary | ICD-10-CM | POA: Diagnosis not present

## 2020-01-06 DIAGNOSIS — N186 End stage renal disease: Secondary | ICD-10-CM | POA: Diagnosis not present

## 2020-01-11 DIAGNOSIS — Z992 Dependence on renal dialysis: Secondary | ICD-10-CM | POA: Diagnosis not present

## 2020-01-11 DIAGNOSIS — N186 End stage renal disease: Secondary | ICD-10-CM | POA: Diagnosis not present

## 2020-01-11 DIAGNOSIS — N2581 Secondary hyperparathyroidism of renal origin: Secondary | ICD-10-CM | POA: Diagnosis not present

## 2020-01-13 DIAGNOSIS — N186 End stage renal disease: Secondary | ICD-10-CM | POA: Diagnosis not present

## 2020-01-13 DIAGNOSIS — Z992 Dependence on renal dialysis: Secondary | ICD-10-CM | POA: Diagnosis not present

## 2020-01-13 DIAGNOSIS — N2581 Secondary hyperparathyroidism of renal origin: Secondary | ICD-10-CM | POA: Diagnosis not present

## 2020-01-15 DIAGNOSIS — N2581 Secondary hyperparathyroidism of renal origin: Secondary | ICD-10-CM | POA: Diagnosis not present

## 2020-01-15 DIAGNOSIS — N186 End stage renal disease: Secondary | ICD-10-CM | POA: Diagnosis not present

## 2020-01-15 DIAGNOSIS — Z992 Dependence on renal dialysis: Secondary | ICD-10-CM | POA: Diagnosis not present

## 2020-01-18 DIAGNOSIS — N186 End stage renal disease: Secondary | ICD-10-CM | POA: Diagnosis not present

## 2020-01-18 DIAGNOSIS — Z992 Dependence on renal dialysis: Secondary | ICD-10-CM | POA: Diagnosis not present

## 2020-01-18 DIAGNOSIS — N2581 Secondary hyperparathyroidism of renal origin: Secondary | ICD-10-CM | POA: Diagnosis not present

## 2020-01-20 DIAGNOSIS — Z992 Dependence on renal dialysis: Secondary | ICD-10-CM | POA: Diagnosis not present

## 2020-01-20 DIAGNOSIS — N2581 Secondary hyperparathyroidism of renal origin: Secondary | ICD-10-CM | POA: Diagnosis not present

## 2020-01-20 DIAGNOSIS — N186 End stage renal disease: Secondary | ICD-10-CM | POA: Diagnosis not present

## 2020-01-22 DIAGNOSIS — N2581 Secondary hyperparathyroidism of renal origin: Secondary | ICD-10-CM | POA: Diagnosis not present

## 2020-01-22 DIAGNOSIS — Z992 Dependence on renal dialysis: Secondary | ICD-10-CM | POA: Diagnosis not present

## 2020-01-22 DIAGNOSIS — N186 End stage renal disease: Secondary | ICD-10-CM | POA: Diagnosis not present

## 2020-01-25 DIAGNOSIS — E1122 Type 2 diabetes mellitus with diabetic chronic kidney disease: Secondary | ICD-10-CM | POA: Diagnosis not present

## 2020-01-25 DIAGNOSIS — Z992 Dependence on renal dialysis: Secondary | ICD-10-CM | POA: Diagnosis not present

## 2020-01-25 DIAGNOSIS — N2581 Secondary hyperparathyroidism of renal origin: Secondary | ICD-10-CM | POA: Diagnosis not present

## 2020-01-25 DIAGNOSIS — N186 End stage renal disease: Secondary | ICD-10-CM | POA: Diagnosis not present

## 2020-01-26 DIAGNOSIS — H2513 Age-related nuclear cataract, bilateral: Secondary | ICD-10-CM | POA: Diagnosis not present

## 2020-01-26 DIAGNOSIS — H401123 Primary open-angle glaucoma, left eye, severe stage: Secondary | ICD-10-CM | POA: Diagnosis not present

## 2020-01-26 DIAGNOSIS — E119 Type 2 diabetes mellitus without complications: Secondary | ICD-10-CM | POA: Diagnosis not present

## 2020-01-26 DIAGNOSIS — H40021 Open angle with borderline findings, high risk, right eye: Secondary | ICD-10-CM | POA: Diagnosis not present

## 2020-01-26 DIAGNOSIS — H10413 Chronic giant papillary conjunctivitis, bilateral: Secondary | ICD-10-CM | POA: Diagnosis not present

## 2020-01-27 DIAGNOSIS — N186 End stage renal disease: Secondary | ICD-10-CM | POA: Diagnosis not present

## 2020-01-27 DIAGNOSIS — N2581 Secondary hyperparathyroidism of renal origin: Secondary | ICD-10-CM | POA: Diagnosis not present

## 2020-01-27 DIAGNOSIS — Z992 Dependence on renal dialysis: Secondary | ICD-10-CM | POA: Diagnosis not present

## 2020-01-29 DIAGNOSIS — Z992 Dependence on renal dialysis: Secondary | ICD-10-CM | POA: Diagnosis not present

## 2020-01-29 DIAGNOSIS — N2581 Secondary hyperparathyroidism of renal origin: Secondary | ICD-10-CM | POA: Diagnosis not present

## 2020-01-29 DIAGNOSIS — N186 End stage renal disease: Secondary | ICD-10-CM | POA: Diagnosis not present

## 2020-02-01 DIAGNOSIS — Z992 Dependence on renal dialysis: Secondary | ICD-10-CM | POA: Diagnosis not present

## 2020-02-01 DIAGNOSIS — N2581 Secondary hyperparathyroidism of renal origin: Secondary | ICD-10-CM | POA: Diagnosis not present

## 2020-02-01 DIAGNOSIS — N186 End stage renal disease: Secondary | ICD-10-CM | POA: Diagnosis not present

## 2020-02-03 DIAGNOSIS — N186 End stage renal disease: Secondary | ICD-10-CM | POA: Diagnosis not present

## 2020-02-03 DIAGNOSIS — Z992 Dependence on renal dialysis: Secondary | ICD-10-CM | POA: Diagnosis not present

## 2020-02-03 DIAGNOSIS — N2581 Secondary hyperparathyroidism of renal origin: Secondary | ICD-10-CM | POA: Diagnosis not present

## 2020-02-05 DIAGNOSIS — N186 End stage renal disease: Secondary | ICD-10-CM | POA: Diagnosis not present

## 2020-02-05 DIAGNOSIS — N2581 Secondary hyperparathyroidism of renal origin: Secondary | ICD-10-CM | POA: Diagnosis not present

## 2020-02-05 DIAGNOSIS — Z992 Dependence on renal dialysis: Secondary | ICD-10-CM | POA: Diagnosis not present

## 2020-02-07 DIAGNOSIS — N186 End stage renal disease: Secondary | ICD-10-CM | POA: Diagnosis not present

## 2020-02-07 DIAGNOSIS — I5042 Chronic combined systolic (congestive) and diastolic (congestive) heart failure: Secondary | ICD-10-CM | POA: Diagnosis not present

## 2020-02-07 DIAGNOSIS — I10 Essential (primary) hypertension: Secondary | ICD-10-CM | POA: Diagnosis not present

## 2020-02-07 DIAGNOSIS — E785 Hyperlipidemia, unspecified: Secondary | ICD-10-CM | POA: Diagnosis not present

## 2020-02-08 DIAGNOSIS — N2581 Secondary hyperparathyroidism of renal origin: Secondary | ICD-10-CM | POA: Diagnosis not present

## 2020-02-08 DIAGNOSIS — N186 End stage renal disease: Secondary | ICD-10-CM | POA: Diagnosis not present

## 2020-02-08 DIAGNOSIS — Z992 Dependence on renal dialysis: Secondary | ICD-10-CM | POA: Diagnosis not present

## 2020-02-10 DIAGNOSIS — N186 End stage renal disease: Secondary | ICD-10-CM | POA: Diagnosis not present

## 2020-02-10 DIAGNOSIS — Z992 Dependence on renal dialysis: Secondary | ICD-10-CM | POA: Diagnosis not present

## 2020-02-10 DIAGNOSIS — N2581 Secondary hyperparathyroidism of renal origin: Secondary | ICD-10-CM | POA: Diagnosis not present

## 2020-02-12 DIAGNOSIS — N186 End stage renal disease: Secondary | ICD-10-CM | POA: Diagnosis not present

## 2020-02-12 DIAGNOSIS — N2581 Secondary hyperparathyroidism of renal origin: Secondary | ICD-10-CM | POA: Diagnosis not present

## 2020-02-12 DIAGNOSIS — Z992 Dependence on renal dialysis: Secondary | ICD-10-CM | POA: Diagnosis not present

## 2020-02-13 ENCOUNTER — Other Ambulatory Visit: Payer: Self-pay | Admitting: Family Medicine

## 2020-02-13 DIAGNOSIS — R252 Cramp and spasm: Secondary | ICD-10-CM

## 2020-02-13 NOTE — Telephone Encounter (Signed)
Requested medications are due for refill today? Yes - This medication refill cannot be delegated.    Requested medications are on active medication list? Yes  Last Refill:   11/24/2019 # 60 with one refill.    Future visit scheduled?  Yes in 3 months.    Notes to Clinic:  This medication refill cannot be delegated.

## 2020-02-15 DIAGNOSIS — N2581 Secondary hyperparathyroidism of renal origin: Secondary | ICD-10-CM | POA: Diagnosis not present

## 2020-02-15 DIAGNOSIS — Z992 Dependence on renal dialysis: Secondary | ICD-10-CM | POA: Diagnosis not present

## 2020-02-15 DIAGNOSIS — N186 End stage renal disease: Secondary | ICD-10-CM | POA: Diagnosis not present

## 2020-02-17 DIAGNOSIS — Z452 Encounter for adjustment and management of vascular access device: Secondary | ICD-10-CM | POA: Diagnosis not present

## 2020-02-17 DIAGNOSIS — N186 End stage renal disease: Secondary | ICD-10-CM | POA: Diagnosis not present

## 2020-02-17 DIAGNOSIS — T82858A Stenosis of vascular prosthetic devices, implants and grafts, initial encounter: Secondary | ICD-10-CM | POA: Diagnosis not present

## 2020-02-17 DIAGNOSIS — N2581 Secondary hyperparathyroidism of renal origin: Secondary | ICD-10-CM | POA: Diagnosis not present

## 2020-02-17 DIAGNOSIS — Z992 Dependence on renal dialysis: Secondary | ICD-10-CM | POA: Diagnosis not present

## 2020-02-17 DIAGNOSIS — I871 Compression of vein: Secondary | ICD-10-CM | POA: Diagnosis not present

## 2020-02-19 DIAGNOSIS — N2581 Secondary hyperparathyroidism of renal origin: Secondary | ICD-10-CM | POA: Diagnosis not present

## 2020-02-19 DIAGNOSIS — Z992 Dependence on renal dialysis: Secondary | ICD-10-CM | POA: Diagnosis not present

## 2020-02-19 DIAGNOSIS — N186 End stage renal disease: Secondary | ICD-10-CM | POA: Diagnosis not present

## 2020-02-22 DIAGNOSIS — N2581 Secondary hyperparathyroidism of renal origin: Secondary | ICD-10-CM | POA: Diagnosis not present

## 2020-02-22 DIAGNOSIS — Z992 Dependence on renal dialysis: Secondary | ICD-10-CM | POA: Diagnosis not present

## 2020-02-22 DIAGNOSIS — N186 End stage renal disease: Secondary | ICD-10-CM | POA: Diagnosis not present

## 2020-02-24 ENCOUNTER — Telehealth: Payer: Self-pay | Admitting: Family Medicine

## 2020-02-24 DIAGNOSIS — N186 End stage renal disease: Secondary | ICD-10-CM | POA: Diagnosis not present

## 2020-02-24 DIAGNOSIS — N2581 Secondary hyperparathyroidism of renal origin: Secondary | ICD-10-CM | POA: Diagnosis not present

## 2020-02-24 DIAGNOSIS — E1122 Type 2 diabetes mellitus with diabetic chronic kidney disease: Secondary | ICD-10-CM | POA: Diagnosis not present

## 2020-02-24 DIAGNOSIS — Z992 Dependence on renal dialysis: Secondary | ICD-10-CM | POA: Diagnosis not present

## 2020-02-24 NOTE — Telephone Encounter (Signed)
Copied from [place] [id number]. Topic: General - Other >> Feb 24, 2020  1:56 PM Keene Breath wrote: Reason for CRM: Patient is requesting a return to work note from the doctor.  Please advise and call patient to let him know when it is available.  CB# [phone]

## 2020-02-25 NOTE — Telephone Encounter (Signed)
Needs letter to give to employer stating that he is fine to return to work.  Patient states that he has not been to work since covid started and now he is on dialysis and the employer just want to know that he is medical ok to work.

## 2020-02-25 NOTE — Telephone Encounter (Signed)
Would you please obtain information on the type of job he will be doing?  Thank you

## 2020-02-26 DIAGNOSIS — Z992 Dependence on renal dialysis: Secondary | ICD-10-CM | POA: Diagnosis not present

## 2020-02-26 DIAGNOSIS — N2581 Secondary hyperparathyroidism of renal origin: Secondary | ICD-10-CM | POA: Diagnosis not present

## 2020-02-26 DIAGNOSIS — N186 End stage renal disease: Secondary | ICD-10-CM | POA: Diagnosis not present

## 2020-02-29 DIAGNOSIS — Z992 Dependence on renal dialysis: Secondary | ICD-10-CM | POA: Diagnosis not present

## 2020-02-29 DIAGNOSIS — N186 End stage renal disease: Secondary | ICD-10-CM | POA: Diagnosis not present

## 2020-02-29 DIAGNOSIS — N2581 Secondary hyperparathyroidism of renal origin: Secondary | ICD-10-CM | POA: Diagnosis not present

## 2020-02-29 NOTE — Telephone Encounter (Signed)
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

## 2020-03-01 DIAGNOSIS — J449 Chronic obstructive pulmonary disease, unspecified: Secondary | ICD-10-CM | POA: Diagnosis not present

## 2020-03-01 DIAGNOSIS — E662 Morbid (severe) obesity with alveolar hypoventilation: Secondary | ICD-10-CM | POA: Diagnosis not present

## 2020-03-01 DIAGNOSIS — J9610 Chronic respiratory failure, unspecified whether with hypoxia or hypercapnia: Secondary | ICD-10-CM | POA: Diagnosis not present

## 2020-03-01 NOTE — Telephone Encounter (Signed)
Done

## 2020-03-01 NOTE — Telephone Encounter (Signed)
Patient has been informed and letter has been placed up front.

## 2020-03-02 DIAGNOSIS — N186 End stage renal disease: Secondary | ICD-10-CM | POA: Diagnosis not present

## 2020-03-02 DIAGNOSIS — Z992 Dependence on renal dialysis: Secondary | ICD-10-CM | POA: Diagnosis not present

## 2020-03-02 DIAGNOSIS — N2581 Secondary hyperparathyroidism of renal origin: Secondary | ICD-10-CM | POA: Diagnosis not present

## 2020-03-04 DIAGNOSIS — N2581 Secondary hyperparathyroidism of renal origin: Secondary | ICD-10-CM | POA: Diagnosis not present

## 2020-03-04 DIAGNOSIS — N186 End stage renal disease: Secondary | ICD-10-CM | POA: Diagnosis not present

## 2020-03-04 DIAGNOSIS — Z992 Dependence on renal dialysis: Secondary | ICD-10-CM | POA: Diagnosis not present

## 2020-03-06 ENCOUNTER — Telehealth: Payer: Self-pay | Admitting: Family Medicine

## 2020-03-06 NOTE — Telephone Encounter (Signed)
Form has been given to [facility] to call patient and complete.

## 2020-03-06 NOTE — Telephone Encounter (Signed)
Patient came in and dropped off [employer] application to be filled out. Forms will be put in PCP box and patient was informed that once forms are completed the nurse would call him.

## 2020-03-07 DIAGNOSIS — N2581 Secondary hyperparathyroidism of renal origin: Secondary | ICD-10-CM | POA: Diagnosis not present

## 2020-03-07 DIAGNOSIS — N186 End stage renal disease: Secondary | ICD-10-CM | POA: Diagnosis not present

## 2020-03-07 DIAGNOSIS — Z992 Dependence on renal dialysis: Secondary | ICD-10-CM | POA: Diagnosis not present

## 2020-03-09 DIAGNOSIS — N2581 Secondary hyperparathyroidism of renal origin: Secondary | ICD-10-CM | POA: Diagnosis not present

## 2020-03-09 DIAGNOSIS — Z992 Dependence on renal dialysis: Secondary | ICD-10-CM | POA: Diagnosis not present

## 2020-03-09 DIAGNOSIS — N186 End stage renal disease: Secondary | ICD-10-CM | POA: Diagnosis not present

## 2020-03-11 DIAGNOSIS — N186 End stage renal disease: Secondary | ICD-10-CM | POA: Diagnosis not present

## 2020-03-11 DIAGNOSIS — N2581 Secondary hyperparathyroidism of renal origin: Secondary | ICD-10-CM | POA: Diagnosis not present

## 2020-03-11 DIAGNOSIS — Z992 Dependence on renal dialysis: Secondary | ICD-10-CM | POA: Diagnosis not present

## 2020-03-13 ENCOUNTER — Other Ambulatory Visit: Payer: Self-pay | Admitting: Family Medicine

## 2020-03-13 DIAGNOSIS — E1169 Type 2 diabetes mellitus with other specified complication: Secondary | ICD-10-CM

## 2020-03-13 DIAGNOSIS — E669 Obesity, unspecified: Secondary | ICD-10-CM

## 2020-03-14 DIAGNOSIS — Z992 Dependence on renal dialysis: Secondary | ICD-10-CM | POA: Diagnosis not present

## 2020-03-14 DIAGNOSIS — N186 End stage renal disease: Secondary | ICD-10-CM | POA: Diagnosis not present

## 2020-03-14 DIAGNOSIS — N2581 Secondary hyperparathyroidism of renal origin: Secondary | ICD-10-CM | POA: Diagnosis not present

## 2020-03-16 DIAGNOSIS — N2581 Secondary hyperparathyroidism of renal origin: Secondary | ICD-10-CM | POA: Diagnosis not present

## 2020-03-16 DIAGNOSIS — N186 End stage renal disease: Secondary | ICD-10-CM | POA: Diagnosis not present

## 2020-03-16 DIAGNOSIS — Z992 Dependence on renal dialysis: Secondary | ICD-10-CM | POA: Diagnosis not present

## 2020-03-18 DIAGNOSIS — N186 End stage renal disease: Secondary | ICD-10-CM | POA: Diagnosis not present

## 2020-03-18 DIAGNOSIS — N2581 Secondary hyperparathyroidism of renal origin: Secondary | ICD-10-CM | POA: Diagnosis not present

## 2020-03-18 DIAGNOSIS — Z992 Dependence on renal dialysis: Secondary | ICD-10-CM | POA: Diagnosis not present

## 2020-03-21 DIAGNOSIS — N186 End stage renal disease: Secondary | ICD-10-CM | POA: Diagnosis not present

## 2020-03-21 DIAGNOSIS — Z992 Dependence on renal dialysis: Secondary | ICD-10-CM | POA: Diagnosis not present

## 2020-03-21 DIAGNOSIS — N2581 Secondary hyperparathyroidism of renal origin: Secondary | ICD-10-CM | POA: Diagnosis not present

## 2020-03-23 DIAGNOSIS — N2581 Secondary hyperparathyroidism of renal origin: Secondary | ICD-10-CM | POA: Diagnosis not present

## 2020-03-23 DIAGNOSIS — Z992 Dependence on renal dialysis: Secondary | ICD-10-CM | POA: Diagnosis not present

## 2020-03-23 DIAGNOSIS — N186 End stage renal disease: Secondary | ICD-10-CM | POA: Diagnosis not present

## 2020-03-25 DIAGNOSIS — N2581 Secondary hyperparathyroidism of renal origin: Secondary | ICD-10-CM | POA: Diagnosis not present

## 2020-03-25 DIAGNOSIS — N186 End stage renal disease: Secondary | ICD-10-CM | POA: Diagnosis not present

## 2020-03-25 DIAGNOSIS — Z992 Dependence on renal dialysis: Secondary | ICD-10-CM | POA: Diagnosis not present

## 2020-03-26 DIAGNOSIS — N186 End stage renal disease: Secondary | ICD-10-CM | POA: Diagnosis not present

## 2020-03-26 DIAGNOSIS — Z992 Dependence on renal dialysis: Secondary | ICD-10-CM | POA: Diagnosis not present

## 2020-03-26 DIAGNOSIS — E1122 Type 2 diabetes mellitus with diabetic chronic kidney disease: Secondary | ICD-10-CM | POA: Diagnosis not present

## 2020-03-28 DIAGNOSIS — N2581 Secondary hyperparathyroidism of renal origin: Secondary | ICD-10-CM | POA: Diagnosis not present

## 2020-03-28 DIAGNOSIS — N186 End stage renal disease: Secondary | ICD-10-CM | POA: Diagnosis not present

## 2020-03-28 DIAGNOSIS — Z992 Dependence on renal dialysis: Secondary | ICD-10-CM | POA: Diagnosis not present

## 2020-03-30 DIAGNOSIS — Z992 Dependence on renal dialysis: Secondary | ICD-10-CM | POA: Diagnosis not present

## 2020-03-30 DIAGNOSIS — N186 End stage renal disease: Secondary | ICD-10-CM | POA: Diagnosis not present

## 2020-03-30 DIAGNOSIS — N2581 Secondary hyperparathyroidism of renal origin: Secondary | ICD-10-CM | POA: Diagnosis not present

## 2020-03-31 NOTE — Telephone Encounter (Signed)
Patient checking on the status of [employer] from, patient would like a follow up call today please leave a detail message regarding the status

## 2020-04-01 DIAGNOSIS — Z992 Dependence on renal dialysis: Secondary | ICD-10-CM | POA: Diagnosis not present

## 2020-04-01 DIAGNOSIS — N2581 Secondary hyperparathyroidism of renal origin: Secondary | ICD-10-CM | POA: Diagnosis not present

## 2020-04-01 DIAGNOSIS — N186 End stage renal disease: Secondary | ICD-10-CM | POA: Diagnosis not present

## 2020-04-04 DIAGNOSIS — N2581 Secondary hyperparathyroidism of renal origin: Secondary | ICD-10-CM | POA: Diagnosis not present

## 2020-04-04 DIAGNOSIS — N186 End stage renal disease: Secondary | ICD-10-CM | POA: Diagnosis not present

## 2020-04-04 DIAGNOSIS — Z992 Dependence on renal dialysis: Secondary | ICD-10-CM | POA: Diagnosis not present

## 2020-04-04 NOTE — Telephone Encounter (Signed)
Pt is calling and would like [employer] to be fax they need the form before 04-15-2020. Please call patient

## 2020-04-05 ENCOUNTER — Telehealth: Payer: Self-pay | Admitting: Licensed Clinical Social Worker

## 2020-04-05 NOTE — Telephone Encounter (Signed)
Adam Dennis has been informed and will reach out to patient in regards to paperwork.

## 2020-04-05 NOTE — Telephone Encounter (Signed)
Call placed to patient to provide update on [employer]AT application, that was successfully faxed on 03/10/2020. LCSW left a detailed message and encouraged pt to contact clinic with any additional questions or concerns.

## 2020-04-06 DIAGNOSIS — N186 End stage renal disease: Secondary | ICD-10-CM | POA: Diagnosis not present

## 2020-04-06 DIAGNOSIS — Z992 Dependence on renal dialysis: Secondary | ICD-10-CM | POA: Diagnosis not present

## 2020-04-06 DIAGNOSIS — N2581 Secondary hyperparathyroidism of renal origin: Secondary | ICD-10-CM | POA: Diagnosis not present

## 2020-04-08 DIAGNOSIS — Z992 Dependence on renal dialysis: Secondary | ICD-10-CM | POA: Diagnosis not present

## 2020-04-08 DIAGNOSIS — N2581 Secondary hyperparathyroidism of renal origin: Secondary | ICD-10-CM | POA: Diagnosis not present

## 2020-04-08 DIAGNOSIS — N186 End stage renal disease: Secondary | ICD-10-CM | POA: Diagnosis not present

## 2020-04-10 ENCOUNTER — Other Ambulatory Visit: Payer: Self-pay | Admitting: Physician Assistant

## 2020-04-10 NOTE — Telephone Encounter (Signed)
Patient would like a call back from [facility] regarding is SCAT paperwork.  He stated that the company still has not received the paperwork as of today.  Please call patient to see if it could be refaxed.  CB# [phone]

## 2020-04-10 NOTE — Telephone Encounter (Signed)
Requested medication (s) are due for refill today: Yes  Requested medication (s) are on the active medication list: Yes  Last refill:  03/24/19  Future visit scheduled: Yes  Notes to clinic:  Prescription has expired.    Requested Prescriptions  Pending Prescriptions Disp Refills   FEROSUL 325 (65 Fe) MG tablet [Pharmacy Med Name: FERROUS SULFATE 325MG  (5GR) TABS] 100 tablet 3    Sig: TAKE 1 TABLET(325 MG) BY MOUTH DAILY WITH BREAKFAST      Endocrinology:  Minerals - Iron Supplementation Failed - 04/10/2020  3:10 AM      Failed - Fe (serum) in normal range and within 360 days    No results found for: IRON, IRONPCTSAT        Failed - Ferritin in normal range and within 360 days    No results found for: FERRITIN        Passed - HGB in normal range and within 360 days    Hemoglobin  Date Value Ref Range Status  09/01/2019 13.6 13.0 - 17.0 g/dL Final  03/24/2019 12.8 (L) 13.0 - 17.7 g/dL Final          Passed - HCT in normal range and within 360 days    HCT  Date Value Ref Range Status  09/01/2019 40.0 39 - 52 % Final   Hematocrit  Date Value Ref Range Status  03/24/2019 39.7 37.5 - 51.0 % Final          Passed - RBC in normal range and within 360 days    RBC  Date Value Ref Range Status  07/06/2019 4.81 4.22 - 5.81 MIL/uL Final          Passed - Valid encounter within last 12 months    Recent Outpatient Visits           4 months ago Diabetes mellitus type 2 in obese [facility])   [place], [place], MD   8 months ago 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

## 2020-04-11 DIAGNOSIS — N2581 Secondary hyperparathyroidism of renal origin: Secondary | ICD-10-CM | POA: Diagnosis not present

## 2020-04-11 DIAGNOSIS — Z992 Dependence on renal dialysis: Secondary | ICD-10-CM | POA: Diagnosis not present

## 2020-04-11 DIAGNOSIS — N186 End stage renal disease: Secondary | ICD-10-CM | POA: Diagnosis not present

## 2020-04-13 DIAGNOSIS — N2581 Secondary hyperparathyroidism of renal origin: Secondary | ICD-10-CM | POA: Diagnosis not present

## 2020-04-13 DIAGNOSIS — N186 End stage renal disease: Secondary | ICD-10-CM | POA: Diagnosis not present

## 2020-04-13 DIAGNOSIS — Z992 Dependence on renal dialysis: Secondary | ICD-10-CM | POA: Diagnosis not present

## 2020-04-13 NOTE — Telephone Encounter (Signed)
Call placed to patient. LCSW informed patient that initial SCAT application was successfully faxed on 03/10/20. Application was re-faxed to [employer] [place] Eligibility Department, per pt request.   Pt was appreciative for the assistance and no additional concerns were noted.

## 2020-04-15 DIAGNOSIS — Z992 Dependence on renal dialysis: Secondary | ICD-10-CM | POA: Diagnosis not present

## 2020-04-15 DIAGNOSIS — N2581 Secondary hyperparathyroidism of renal origin: Secondary | ICD-10-CM | POA: Diagnosis not present

## 2020-04-15 DIAGNOSIS — N186 End stage renal disease: Secondary | ICD-10-CM | POA: Diagnosis not present

## 2020-04-17 DIAGNOSIS — Z992 Dependence on renal dialysis: Secondary | ICD-10-CM | POA: Diagnosis not present

## 2020-04-17 DIAGNOSIS — N186 End stage renal disease: Secondary | ICD-10-CM | POA: Diagnosis not present

## 2020-04-17 DIAGNOSIS — N2581 Secondary hyperparathyroidism of renal origin: Secondary | ICD-10-CM | POA: Diagnosis not present

## 2020-04-18 ENCOUNTER — Other Ambulatory Visit: Payer: Self-pay | Admitting: Family Medicine

## 2020-04-18 DIAGNOSIS — R252 Cramp and spasm: Secondary | ICD-10-CM

## 2020-04-18 NOTE — Telephone Encounter (Signed)
Requested medication (s) are due for refill today:yes  Requested medication (s) are on the active medication list: yes  Last refill: 02/14/20  #60  1 refill  Future visit scheduled yes  Notes to clinic:not delegated  Requested Prescriptions  Pending Prescriptions Disp Refills   methocarbamol (ROBAXIN) 500 MG tablet [Pharmacy Med Name: METHOCARBAMOL 500MG  TABLETS] 60 tablet 1    Sig: TAKE 1 TABLET(500 MG) BY MOUTH TWICE DAILY AS NEEDED FOR MUSCLE SPASMS      Not Delegated - Analgesics:  Muscle Relaxants Failed - 04/18/2020  3:10 AM      Failed - This refill cannot be delegated      Passed - Valid encounter within last 6 months    Recent Outpatient Visits           4 months ago Diabetes mellitus type 2 in obese [facility])   [place], [place], MD   8 months ago 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)   [place], [place], [place]   1 year ago Chronic combined systolic and diastolic CHF (congestive heart failure) [facility])   [place], MD       Future Appointments             In 1 month Charlott Rakes, MD [place]

## 2020-04-19 DIAGNOSIS — N186 End stage renal disease: Secondary | ICD-10-CM | POA: Diagnosis not present

## 2020-04-19 DIAGNOSIS — N2581 Secondary hyperparathyroidism of renal origin: Secondary | ICD-10-CM | POA: Diagnosis not present

## 2020-04-19 DIAGNOSIS — Z992 Dependence on renal dialysis: Secondary | ICD-10-CM | POA: Diagnosis not present

## 2020-04-22 DIAGNOSIS — N186 End stage renal disease: Secondary | ICD-10-CM | POA: Diagnosis not present

## 2020-04-22 DIAGNOSIS — N2581 Secondary hyperparathyroidism of renal origin: Secondary | ICD-10-CM | POA: Diagnosis not present

## 2020-04-22 DIAGNOSIS — Z992 Dependence on renal dialysis: Secondary | ICD-10-CM | POA: Diagnosis not present

## 2020-04-25 DIAGNOSIS — N186 End stage renal disease: Secondary | ICD-10-CM | POA: Diagnosis not present

## 2020-04-25 DIAGNOSIS — Z992 Dependence on renal dialysis: Secondary | ICD-10-CM | POA: Diagnosis not present

## 2020-04-25 DIAGNOSIS — E1122 Type 2 diabetes mellitus with diabetic chronic kidney disease: Secondary | ICD-10-CM | POA: Diagnosis not present

## 2020-04-25 DIAGNOSIS — N2581 Secondary hyperparathyroidism of renal origin: Secondary | ICD-10-CM | POA: Diagnosis not present

## 2020-04-27 DIAGNOSIS — Z992 Dependence on renal dialysis: Secondary | ICD-10-CM | POA: Diagnosis not present

## 2020-04-27 DIAGNOSIS — N2581 Secondary hyperparathyroidism of renal origin: Secondary | ICD-10-CM | POA: Diagnosis not present

## 2020-04-27 DIAGNOSIS — N186 End stage renal disease: Secondary | ICD-10-CM | POA: Diagnosis not present

## 2020-04-29 DIAGNOSIS — N2581 Secondary hyperparathyroidism of renal origin: Secondary | ICD-10-CM | POA: Diagnosis not present

## 2020-04-29 DIAGNOSIS — Z992 Dependence on renal dialysis: Secondary | ICD-10-CM | POA: Diagnosis not present

## 2020-04-29 DIAGNOSIS — N186 End stage renal disease: Secondary | ICD-10-CM | POA: Diagnosis not present

## 2020-05-02 DIAGNOSIS — N2581 Secondary hyperparathyroidism of renal origin: Secondary | ICD-10-CM | POA: Diagnosis not present

## 2020-05-02 DIAGNOSIS — Z992 Dependence on renal dialysis: Secondary | ICD-10-CM | POA: Diagnosis not present

## 2020-05-02 DIAGNOSIS — N186 End stage renal disease: Secondary | ICD-10-CM | POA: Diagnosis not present

## 2020-05-04 DIAGNOSIS — N2581 Secondary hyperparathyroidism of renal origin: Secondary | ICD-10-CM | POA: Diagnosis not present

## 2020-05-04 DIAGNOSIS — Z992 Dependence on renal dialysis: Secondary | ICD-10-CM | POA: Diagnosis not present

## 2020-05-04 DIAGNOSIS — N186 End stage renal disease: Secondary | ICD-10-CM | POA: Diagnosis not present

## 2020-05-06 DIAGNOSIS — Z992 Dependence on renal dialysis: Secondary | ICD-10-CM | POA: Diagnosis not present

## 2020-05-06 DIAGNOSIS — N2581 Secondary hyperparathyroidism of renal origin: Secondary | ICD-10-CM | POA: Diagnosis not present

## 2020-05-06 DIAGNOSIS — N186 End stage renal disease: Secondary | ICD-10-CM | POA: Diagnosis not present

## 2020-05-08 DIAGNOSIS — M109 Gout, unspecified: Secondary | ICD-10-CM | POA: Diagnosis not present

## 2020-05-08 DIAGNOSIS — E785 Hyperlipidemia, unspecified: Secondary | ICD-10-CM | POA: Diagnosis not present

## 2020-05-08 DIAGNOSIS — I10 Essential (primary) hypertension: Secondary | ICD-10-CM | POA: Diagnosis not present

## 2020-05-08 DIAGNOSIS — N186 End stage renal disease: Secondary | ICD-10-CM | POA: Diagnosis not present

## 2020-05-09 DIAGNOSIS — N2581 Secondary hyperparathyroidism of renal origin: Secondary | ICD-10-CM | POA: Diagnosis not present

## 2020-05-09 DIAGNOSIS — N186 End stage renal disease: Secondary | ICD-10-CM | POA: Diagnosis not present

## 2020-05-09 DIAGNOSIS — Z992 Dependence on renal dialysis: Secondary | ICD-10-CM | POA: Diagnosis not present

## 2020-05-10 DIAGNOSIS — I10 Essential (primary) hypertension: Secondary | ICD-10-CM | POA: Diagnosis not present

## 2020-05-10 DIAGNOSIS — I5042 Chronic combined systolic (congestive) and diastolic (congestive) heart failure: Secondary | ICD-10-CM | POA: Diagnosis not present

## 2020-05-10 DIAGNOSIS — G4733 Obstructive sleep apnea (adult) (pediatric): Secondary | ICD-10-CM | POA: Diagnosis not present

## 2020-05-10 DIAGNOSIS — E785 Hyperlipidemia, unspecified: Secondary | ICD-10-CM | POA: Diagnosis not present

## 2020-05-11 DIAGNOSIS — N186 End stage renal disease: Secondary | ICD-10-CM | POA: Diagnosis not present

## 2020-05-11 DIAGNOSIS — Z992 Dependence on renal dialysis: Secondary | ICD-10-CM | POA: Diagnosis not present

## 2020-05-11 DIAGNOSIS — N2581 Secondary hyperparathyroidism of renal origin: Secondary | ICD-10-CM | POA: Diagnosis not present

## 2020-05-13 DIAGNOSIS — Z992 Dependence on renal dialysis: Secondary | ICD-10-CM | POA: Diagnosis not present

## 2020-05-13 DIAGNOSIS — N2581 Secondary hyperparathyroidism of renal origin: Secondary | ICD-10-CM | POA: Diagnosis not present

## 2020-05-13 DIAGNOSIS — N186 End stage renal disease: Secondary | ICD-10-CM | POA: Diagnosis not present

## 2020-05-16 DIAGNOSIS — N186 End stage renal disease: Secondary | ICD-10-CM | POA: Diagnosis not present

## 2020-05-16 DIAGNOSIS — N2581 Secondary hyperparathyroidism of renal origin: Secondary | ICD-10-CM | POA: Diagnosis not present

## 2020-05-16 DIAGNOSIS — Z992 Dependence on renal dialysis: Secondary | ICD-10-CM | POA: Diagnosis not present

## 2020-05-17 ENCOUNTER — Ambulatory Visit: Payer: Medicare HMO | Admitting: Family Medicine

## 2020-05-18 DIAGNOSIS — N2581 Secondary hyperparathyroidism of renal origin: Secondary | ICD-10-CM | POA: Diagnosis not present

## 2020-05-18 DIAGNOSIS — Z992 Dependence on renal dialysis: Secondary | ICD-10-CM | POA: Diagnosis not present

## 2020-05-18 DIAGNOSIS — N186 End stage renal disease: Secondary | ICD-10-CM | POA: Diagnosis not present

## 2020-05-21 DIAGNOSIS — Z992 Dependence on renal dialysis: Secondary | ICD-10-CM | POA: Diagnosis not present

## 2020-05-21 DIAGNOSIS — N186 End stage renal disease: Secondary | ICD-10-CM | POA: Diagnosis not present

## 2020-05-21 DIAGNOSIS — N2581 Secondary hyperparathyroidism of renal origin: Secondary | ICD-10-CM | POA: Diagnosis not present

## 2020-05-23 DIAGNOSIS — N2581 Secondary hyperparathyroidism of renal origin: Secondary | ICD-10-CM | POA: Diagnosis not present

## 2020-05-23 DIAGNOSIS — N186 End stage renal disease: Secondary | ICD-10-CM | POA: Diagnosis not present

## 2020-05-23 DIAGNOSIS — Z992 Dependence on renal dialysis: Secondary | ICD-10-CM | POA: Diagnosis not present

## 2020-05-24 ENCOUNTER — Other Ambulatory Visit: Payer: Self-pay | Admitting: Family Medicine

## 2020-05-24 ENCOUNTER — Encounter: Payer: Self-pay | Admitting: Family Medicine

## 2020-05-24 ENCOUNTER — Other Ambulatory Visit: Payer: Self-pay

## 2020-05-24 ENCOUNTER — Ambulatory Visit: Payer: Medicare HMO | Attending: Family Medicine | Admitting: Family Medicine

## 2020-05-24 VITALS — BP 123/73 | HR 82 | Ht 71.0 in | Wt 318.6 lb

## 2020-05-24 DIAGNOSIS — Z992 Dependence on renal dialysis: Secondary | ICD-10-CM | POA: Diagnosis not present

## 2020-05-24 DIAGNOSIS — R252 Cramp and spasm: Secondary | ICD-10-CM

## 2020-05-24 DIAGNOSIS — E1122 Type 2 diabetes mellitus with diabetic chronic kidney disease: Secondary | ICD-10-CM | POA: Diagnosis not present

## 2020-05-24 DIAGNOSIS — N186 End stage renal disease: Secondary | ICD-10-CM

## 2020-05-24 DIAGNOSIS — E1169 Type 2 diabetes mellitus with other specified complication: Secondary | ICD-10-CM | POA: Diagnosis not present

## 2020-05-24 DIAGNOSIS — I10 Essential (primary) hypertension: Secondary | ICD-10-CM

## 2020-05-24 DIAGNOSIS — E669 Obesity, unspecified: Secondary | ICD-10-CM | POA: Diagnosis not present

## 2020-05-24 DIAGNOSIS — F0631 Mood disorder due to known physiological condition with depressive features: Secondary | ICD-10-CM

## 2020-05-24 LAB — POCT GLYCOSYLATED HEMOGLOBIN (HGB A1C): Hemoglobin A1C: 6 % — AB (ref 4.0–5.6)

## 2020-05-24 MED ORDER — SERTRALINE HCL 25 MG PO TABS: 25.0000 mg | ORAL_TABLET | Freq: Every day | ORAL | 1 refills | Status: DC

## 2020-05-24 MED ORDER — ATORVASTATIN CALCIUM 40 MG PO TABS: 40.0000 mg | ORAL_TABLET | Freq: Every day | ORAL | 1 refills | Status: DC

## 2020-05-24 MED ORDER — AMLODIPINE BESYLATE 5 MG PO TABS: ORAL_TABLET | ORAL | 1 refills | Status: DC

## 2020-05-24 MED ORDER — METHOCARBAMOL 500 MG PO TABS: ORAL_TABLET | ORAL | 1 refills | Status: DC

## 2020-05-24 NOTE — Progress Notes (Signed)
Subjective:  Patient ID: Adam Dennis, male    DOB: 1960/07/07  Age: 59 y.o. MRN: [id number]  CC: Hypertension   HPI Adam Dennis is a 59 year old male with a history of hypertension,ESRD on HD (on T, Th, Sat),obstructive sleep apnea (on CPAP at night), type 2 diabetes mellitus (A1c 6.0 diet controlled), combined systolic and diastolic CHF (EF 62-37% from echo of 06/2019) , obesity hypoventilation syndrome previously on 4 L of oxygen for chronic respiratory failure (no longer requires oxygen),Emphysemawho presents today for an office visit.  Still cramps after dialysis sessions.  Currently on Robaxin for this. He denies being depressed but states he is doing well on Zoloft and would like to keep taking it. His emphysema is stable with no dyspnea, wheezing.  Compliant with his antihypertensive.  Endorses working intermittently for exercise.  Past Medical History:  Diagnosis Date  . Arthritis    "back" (02/06/2017)  . CHF (congestive heart failure) ([place])    new onset /Encounter Date 09/12/2006  . Chronic combined systolic and diastolic heart failure ([place])   . CKD (chronic kidney disease) stage 4, GFR 15-29 ml/min (HCC)   . COPD (chronic obstructive pulmonary disease) ([place])   . End stage renal disease on dialysis [facility])    Tues/ Thur/ [address]  . Glaucoma, right eye   . High cholesterol   . Hypertension   . Hypertrophy of tonsils alone   . Myocardial infarction [facility])    "small one; ~ 2008" (02/06/2017)  . Obesity, unspecified   . On home oxygen therapy    "4L; 24/7" (04/10/2017)  . OSA on CPAP   . Type II diabetes mellitus ([place])     Past Surgical History:  Procedure Laterality Date  . AV FISTULA PLACEMENT Right 07/01/2019   Procedure: Right arm arteriovenous fistual;  Surgeon: Waynetta Sandy, MD;  Location: [place];  Service: Vascular;  Laterality: Right;  . FISTULA SUPERFICIALIZATION Right 09/01/2019   Procedure: RIGHT UPPER ARM FISTULA  TRANSPOSITION OF  RIGHT UPPPER ARM BRACHIAL CEPHALIC FISTULA;  Surgeon: Waynetta Sandy, MD;  Location: [place];  Service: Vascular;  Laterality: Right;  . HERNIA REPAIR    . INSERTION OF DIALYSIS CATHETER Right 07/01/2019   Procedure: INSERTION OF TUNNELED DIALYSIS CATHETER;  Surgeon: Waynetta Sandy, MD;  Location: [place];  Service: Vascular;  Laterality: Right;  . TEE WITHOUT CARDIOVERSION N/A 04/16/2017   Procedure: TRANSESOPHAGEAL ECHOCARDIOGRAM (TEE);  Surgeon: [name], MD;  Location: [facility] ENDOSCOPY;  Service: Cardiovascular;  Laterality: N/A;  . UMBILICAL HERNIA REPAIR  1960s   "when I was little baby"    Family History  Problem Relation Age of Onset  . Hypertension Mother   . Diabetes Brother   . Diabetes Sister   . Cancer Maternal Aunt   . Amblyopia Neg Hx   . Blindness Neg Hx   . Cataracts Neg Hx   . Glaucoma Neg Hx   . Macular degeneration Neg Hx   . Strabismus Neg Hx   . Retinal detachment Neg Hx   . Retinitis pigmentosa Neg Hx     No Known Allergies  Outpatient Medications Prior to Visit  Medication Sig Dispense Refill  . ACCU-CHEK SOFTCLIX LANCETS lancets Use as instructed to check blood sugar up to 3 times daily. 100 each 11  . acetaminophen (TYLENOL) 500 MG tablet Take 500 mg by mouth every 6 (six) hours as needed for moderate pain or headache.    . albuterol (VENTOLIN HFA)  108 (90 Base) MCG/ACT inhaler INHALE 2 PUFFS INTO THE LUNGS EVERY 6 HOURS AS NEEDED FOR WHEEZING OR SHORTNESS OF BREATH 54 g 0  . aspirin EC 81 MG tablet Take 81 mg by mouth daily.    . Blood Glucose Monitoring Suppl (ACCU-CHEK AVIVA) device Use as instructed to check blood sugar up to 3 times daily. 1 each 0  . calcium carbonate (TUMS - DOSED IN MG ELEMENTAL CALCIUM) 500 MG chewable tablet Chew 1 tablet (200 mg of elemental calcium total) by mouth 2 (two) times daily. (Patient taking differently: Chew 500 mg by mouth 2 (two) times daily.)    . cholecalciferol (VITAMIN D3) 25 MCG (1000 UNIT)  tablet Take 1,000 Units by mouth daily.    . FEROSUL 325 (65 Fe) MG tablet TAKE 1 TABLET(325 MG) BY MOUTH DAILY WITH BREAKFAST 100 tablet 3  . Fluticasone-Salmeterol (ADVAIR DISKUS) 250-50 MCG/DOSE AEPB Inhale 1 puff into the lungs 2 (two) times daily. (Patient taking differently: Inhale 1 puff into the lungs daily.) 1 each 6  . glucose blood (ACCU-CHEK AVIVA PLUS) test strip Use as instructed to check blood sugar up to 3 times daily. 100 each 11  . Lancet Devices (ACCU-CHEK SOFTCLIX) lancets Use as instructed daily. 1 each 5  . lidocaine-prilocaine (EMLA) cream Apply 1 application topically as needed (topical anesthesia for hemodialysis if Gebauers and Lidocaine injection are ineffective.). 30 g 0  . Multiple Vitamin (MULTIVITAMIN ADULT) TABS Take 1 tablet by mouth daily. (Patient taking differently: Take 1 tablet by mouth at bedtime.) 30 tablet 3  . nitroGLYCERIN (NITROSTAT) 0.4 MG SL tablet PLACE 1 TABLET BY MOUTH UNDER TONGUE EVERY 5 MINUTES FOR 3 DOSES AS NEEDED FOR CHEST PAIN (Patient taking differently: Place 0.4 mg under the tongue every 5 (five) minutes as needed for chest pain.) 25 tablet 0  . timolol (TIMOPTIC-XR) 0.5 % ophthalmic gel-forming Place 1 drop into both eyes daily.     . TRAVATAN Z 0.004 % SOLN ophthalmic solution Place 1 drop into both eyes at bedtime.  12  . amLODipine (NORVASC) 5 MG tablet TAKE 1 TABLET(5 MG) BY MOUTH DAILY 90 tablet 1  . atorvastatin (LIPITOR) 40 MG tablet TAKE 1 TABLET(40 MG) BY MOUTH DAILY 90 tablet 0  . methocarbamol (ROBAXIN) 500 MG tablet TAKE 1 TABLET(500 MG) BY MOUTH TWICE DAILY AS NEEDED FOR MUSCLE SPASMS 60 tablet 0  . oxyCODONE-acetaminophen (PERCOCET) 5-325 MG tablet Take 1 tablet by mouth every 6 (six) hours as needed. (Patient not taking: Reported on 05/24/2020) 15 tablet 0  . hydrALAZINE (APRESOLINE) 25 MG tablet TAKE 1 TABLET(25 MG) BY MOUTH THREE TIMES DAILY (Patient not taking: No sig reported) 90 tablet 0  . sertraline (ZOLOFT) 25 MG tablet  Take 1 tablet (25 mg total) by mouth daily. 90 tablet 1   No facility-administered medications prior to visit.     ROS Review of Systems  Constitutional: Negative for activity change and appetite change.  HENT: Negative for sinus pressure and sore throat.   Eyes: Negative for visual disturbance.  Respiratory: Negative for cough, chest tightness and shortness of breath.   Cardiovascular: Negative for chest pain and leg swelling.  Gastrointestinal: Negative for abdominal distention, abdominal pain, constipation and diarrhea.  Endocrine: Negative.   Genitourinary: Negative for dysuria.  Musculoskeletal: Negative for joint swelling and myalgias.  Skin: Negative for rash.  Allergic/Immunologic: Negative.   Neurological: Negative for weakness, light-headedness and numbness.  Psychiatric/Behavioral: Negative for dysphoric mood and suicidal ideas.    Objective:  BP 123/73   Pulse 82   Ht 5\' 11"  (1.803 m)   Wt (!) 318 lb 9.6 oz (144.5 kg)   SpO2 99%   BMI 44.44 kg/m   BP/Weight 05/24/2020 2/83/1517 10/26/6071  Systolic BP 710 626 948  Diastolic BP 73 75 87  Wt. (Lbs) 318.6 315 309  BMI 44.44 43.93 43.1      Physical Exam Constitutional:      Appearance: He is well-developed. He is obese.  Neck:     Vascular: No JVD.  Cardiovascular:     Rate and Rhythm: Normal rate.     Heart sounds: Normal heart sounds. No murmur heard.   Pulmonary:     Effort: Pulmonary effort is normal.     Breath sounds: Normal breath sounds. No wheezing or rales.  Chest:     Chest wall: No tenderness.  Abdominal:     General: Bowel sounds are normal. There is no distension.     Palpations: Abdomen is soft. There is no mass.     Tenderness: There is no abdominal tenderness.  Musculoskeletal:        General: Normal range of motion.     Right lower leg: No edema.     Left lower leg: No edema.     Comments: AV fistula in R arm with palpable thrill  Neurological:     Mental Status: He is alert  and oriented to person, place, and time.  Psychiatric:        Mood and Affect: Mood normal.     CMP Latest Ref Rng & Units 09/01/2019 07/06/2019 07/04/2019  Glucose 70 - 99 mg/dL 91 104(H) 96  BUN 6 - 20 mg/dL 30(H) 57(H) 35(H)  Creatinine 0.61 - 1.24 mg/dL 6.20(H) 7.58(H) 5.52(H)  Sodium 135 - 145 mmol/L 137 136 136  Potassium 3.5 - 5.1 mmol/L 4.4 3.7 4.2  Chloride 98 - 111 mmol/L 96(L) 94(L) 95(L)  CO2 22 - 32 mmol/L - 28 26  Calcium 8.9 - 10.3 mg/dL - 8.6(L) 8.5(L)  Total Protein 6.5 - 8.1 g/dL - - -  Total Bilirubin 0.3 - 1.2 mg/dL - - -  Alkaline Phos 38 - 126 U/L - - -  AST 15 - 41 U/L - - -  ALT 0 - 44 U/L - - -    Lipid Panel     Component Value Date/Time   CHOL 119 03/24/2019 1017   TRIG 59 03/24/2019 1017   HDL 51 03/24/2019 1017   CHOLHDL 2.3 03/24/2019 1017   CHOLHDL 2.8 10/07/2014 1547   VLDL 13 10/07/2014 1547   LDLCALC 55 03/24/2019 1017    CBC    Component Value Date/Time   WBC 8.9 07/06/2019 0754   RBC 4.81 07/06/2019 0754   HGB 13.6 09/01/2019 0826   HGB 12.8 (L) 03/24/2019 1017   HCT 40.0 09/01/2019 0826   HCT 39.7 03/24/2019 1017   PLT 186 07/06/2019 0754   PLT 191 03/24/2019 1017   MCV 91.3 07/06/2019 0754   MCV 87 03/24/2019 1017   MCH 28.3 07/06/2019 0754   MCHC 31.0 07/06/2019 0754   RDW 16.5 (H) 07/06/2019 0754   RDW 13.9 03/24/2019 1017   LYMPHSABS 1.3 03/24/2019 1017   MONOABS 0.8 04/28/2017 1236   EOSABS 0.0 03/24/2019 1017   BASOSABS 0.0 03/24/2019 1017    Lab Results  Component Value Date   HGBA1C 6.0 (A) 05/24/2020    Assessment & Plan:  1. Diabetes mellitus type 2 in obese ([place])  Diet controlled with A1c of 6.0 Counseled on Diabetic diet, my plate method, 888 minutes of moderate intensity exercise/week Blood sugar logs with fasting goals of 80-120 mg/dl, random of less than 180 and in the event of sugars less than 60 mg/dl or greater than 400 mg/dl encouraged to notify the clinic. Advised on the need for annual eye exams,  annual foot exams, Pneumonia vaccine. - Lipid panel - atorvastatin (LIPITOR) 40 MG tablet; Take 1 tablet (40 mg total) by mouth daily.  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  Dispense: 90 tablet; Refill: 1  3. Muscle cramp Occurs intermittently after hemodialysis Could be secondary to electrolyte depletion - methocarbamol (ROBAXIN) 500 MG tablet; TAKE 1 TABLET(500 MG) BY MOUTH TWICE DAILY AS NEEDED FOR MUSCLE SPASMS  Dispense: 60 tablet; Refill: 1  4. Essential hypertension, benign Controlled Counseled on blood pressure goal of less than 130/80, low-sodium, DASH diet, medication compliance, 150 minutes of moderate intensity exercise per week. Discussed medication compliance, adverse effects. - amLODipine (NORVASC) 5 MG tablet; TAKE 1 TABLET(5 MG) BY MOUTH DAILY  Dispense: 90 tablet; Refill: 1  5. Type 2 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, without long-term current use of insulin ([place]) Tolerating hemodialysis sessions.    Meds ordered this encounter  Medications  . sertraline (ZOLOFT) 25 MG tablet    Sig: Take 1 tablet (25 mg total) by mouth daily.    Dispense:  90 tablet    Refill:  1  . methocarbamol (ROBAXIN) 500 MG tablet    Sig: TAKE 1 TABLET(500 MG) BY MOUTH TWICE DAILY AS NEEDED FOR MUSCLE SPASMS    Dispense:  60 tablet    Refill:  1  . atorvastatin (LIPITOR) 40 MG tablet    Sig: Take 1 tablet (40 mg total) by mouth daily.    Dispense:  90 tablet    Refill:  1  . amLODipine (NORVASC) 5 MG tablet    Sig: TAKE 1 TABLET(5 MG) BY MOUTH DAILY    Dispense:  90 tablet    Refill:  1    Follow-up: Return in about 6 months (around 11/22/2020) for Chronic disease management.       Charlott Rakes, MD, FAAFP. [facility] [place],  [place]   05/24/2020, 12:23 PM

## 2020-05-24 NOTE — Telephone Encounter (Signed)
Requested medication (s) are due for refill today: yes  Requested medication (s) are on the active medication list: yes  Last refill: 04/18/20  #60 0 refills  Future visit scheduled yes  05/24/20  Notes to clinic: not delegated  Requested Prescriptions  Pending Prescriptions Disp Refills   methocarbamol (ROBAXIN) 500 MG tablet [Pharmacy Med Name: METHOCARBAMOL 500MG  TABLETS] 60 tablet 0    Sig: TAKE 1 TABLET(500 MG) BY MOUTH TWICE DAILY AS NEEDED FOR MUSCLE SPASMS      Not Delegated - Analgesics:  Muscle Relaxants Failed - 05/24/2020  3:09 AM      Failed - This refill cannot be delegated      Failed - Valid encounter within last 6 months    Recent Outpatient Visits           6 months ago Diabetes mellitus type 2 in obese [facility])   [place], [place], MD   9 months ago 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

## 2020-05-25 ENCOUNTER — Telehealth: Payer: Self-pay

## 2020-05-25 DIAGNOSIS — N2581 Secondary hyperparathyroidism of renal origin: Secondary | ICD-10-CM | POA: Diagnosis not present

## 2020-05-25 DIAGNOSIS — N186 End stage renal disease: Secondary | ICD-10-CM | POA: Diagnosis not present

## 2020-05-25 DIAGNOSIS — Z992 Dependence on renal dialysis: Secondary | ICD-10-CM | POA: Diagnosis not present

## 2020-05-25 LAB — LIPID PANEL
Chol/HDL Ratio: 2.5 ratio (ref 0.0–5.0)
Cholesterol, Total: 160 mg/dL (ref 100–199)
HDL: 63 mg/dL (ref >39)
LDL Chol Calc (NIH): 82 mg/dL (ref 0–99)
Triglycerides: 79 mg/dL (ref 0–149)
VLDL Cholesterol Cal: 15 mg/dL (ref 5–40)

## 2020-05-25 NOTE — Telephone Encounter (Signed)
-----   Message from Charlott Rakes, MD sent at 05/25/2020  8:43 AM EST ----- Please inform the patient that labs are normal. Thank you.

## 2020-05-25 NOTE — Telephone Encounter (Signed)
Pt was called and a VM was left informing pt of normal results. 

## 2020-05-26 DIAGNOSIS — E1122 Type 2 diabetes mellitus with diabetic chronic kidney disease: Secondary | ICD-10-CM | POA: Diagnosis not present

## 2020-05-26 DIAGNOSIS — Z992 Dependence on renal dialysis: Secondary | ICD-10-CM | POA: Diagnosis not present

## 2020-05-26 DIAGNOSIS — N186 End stage renal disease: Secondary | ICD-10-CM | POA: Diagnosis not present

## 2020-05-28 ENCOUNTER — Other Ambulatory Visit: Payer: Self-pay | Admitting: Family Medicine

## 2020-05-28 DIAGNOSIS — E1169 Type 2 diabetes mellitus with other specified complication: Secondary | ICD-10-CM

## 2020-05-28 DIAGNOSIS — E669 Obesity, unspecified: Secondary | ICD-10-CM

## 2020-05-28 DIAGNOSIS — Z992 Dependence on renal dialysis: Secondary | ICD-10-CM | POA: Diagnosis not present

## 2020-05-28 DIAGNOSIS — N186 End stage renal disease: Secondary | ICD-10-CM | POA: Diagnosis not present

## 2020-05-28 DIAGNOSIS — F0631 Mood disorder due to known physiological condition with depressive features: Secondary | ICD-10-CM

## 2020-05-28 DIAGNOSIS — N2581 Secondary hyperparathyroidism of renal origin: Secondary | ICD-10-CM | POA: Diagnosis not present

## 2020-05-28 DIAGNOSIS — I10 Essential (primary) hypertension: Secondary | ICD-10-CM

## 2020-05-30 DIAGNOSIS — J449 Chronic obstructive pulmonary disease, unspecified: Secondary | ICD-10-CM | POA: Diagnosis not present

## 2020-05-30 DIAGNOSIS — Z992 Dependence on renal dialysis: Secondary | ICD-10-CM | POA: Diagnosis not present

## 2020-05-30 DIAGNOSIS — E662 Morbid (severe) obesity with alveolar hypoventilation: Secondary | ICD-10-CM | POA: Diagnosis not present

## 2020-05-30 DIAGNOSIS — N186 End stage renal disease: Secondary | ICD-10-CM | POA: Diagnosis not present

## 2020-05-30 DIAGNOSIS — N2581 Secondary hyperparathyroidism of renal origin: Secondary | ICD-10-CM | POA: Diagnosis not present

## 2020-05-30 DIAGNOSIS — J9610 Chronic respiratory failure, unspecified whether with hypoxia or hypercapnia: Secondary | ICD-10-CM | POA: Diagnosis not present

## 2020-06-01 DIAGNOSIS — N2581 Secondary hyperparathyroidism of renal origin: Secondary | ICD-10-CM | POA: Diagnosis not present

## 2020-06-01 DIAGNOSIS — N186 End stage renal disease: Secondary | ICD-10-CM | POA: Diagnosis not present

## 2020-06-01 DIAGNOSIS — Z992 Dependence on renal dialysis: Secondary | ICD-10-CM | POA: Diagnosis not present

## 2020-06-03 DIAGNOSIS — Z992 Dependence on renal dialysis: Secondary | ICD-10-CM | POA: Diagnosis not present

## 2020-06-03 DIAGNOSIS — N186 End stage renal disease: Secondary | ICD-10-CM | POA: Diagnosis not present

## 2020-06-03 DIAGNOSIS — N2581 Secondary hyperparathyroidism of renal origin: Secondary | ICD-10-CM | POA: Diagnosis not present

## 2020-06-06 DIAGNOSIS — Z992 Dependence on renal dialysis: Secondary | ICD-10-CM | POA: Diagnosis not present

## 2020-06-06 DIAGNOSIS — N2581 Secondary hyperparathyroidism of renal origin: Secondary | ICD-10-CM | POA: Diagnosis not present

## 2020-06-06 DIAGNOSIS — N186 End stage renal disease: Secondary | ICD-10-CM | POA: Diagnosis not present

## 2020-06-08 DIAGNOSIS — Z992 Dependence on renal dialysis: Secondary | ICD-10-CM | POA: Diagnosis not present

## 2020-06-08 DIAGNOSIS — N186 End stage renal disease: Secondary | ICD-10-CM | POA: Diagnosis not present

## 2020-06-08 DIAGNOSIS — N2581 Secondary hyperparathyroidism of renal origin: Secondary | ICD-10-CM | POA: Diagnosis not present

## 2020-06-08 IMAGING — US US RENAL
1 series · 14 of 21 positions shown · non-contrast
Comparison: Renal ultrasound 10/11/2015

CLINICAL DATA: Chronic kidney disease stage 4.

EXAM:
RENAL / URINARY TRACT ULTRASOUND COMPLETE

[Series 1: us renal · 0.20mm/px · 14 of 21 slices shown]
[im 1/21]
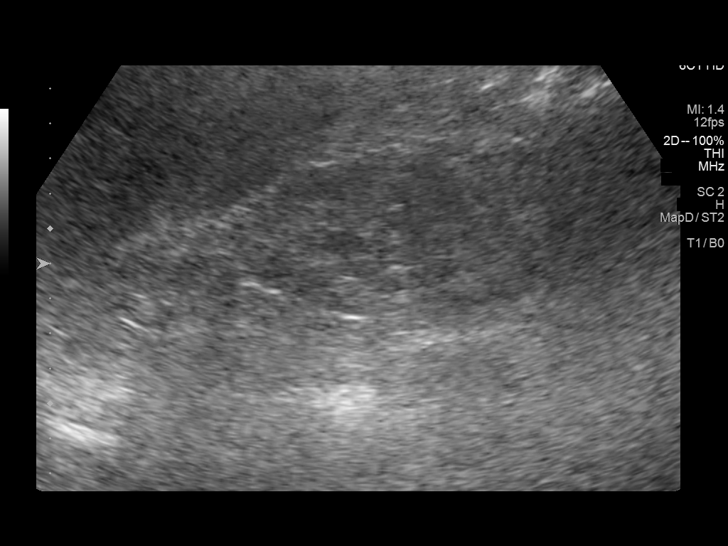
[im 3/21]
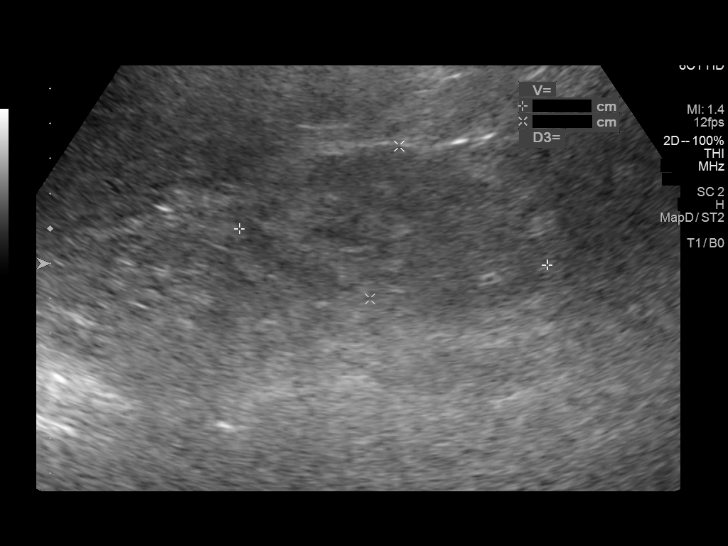
[im 4/21]
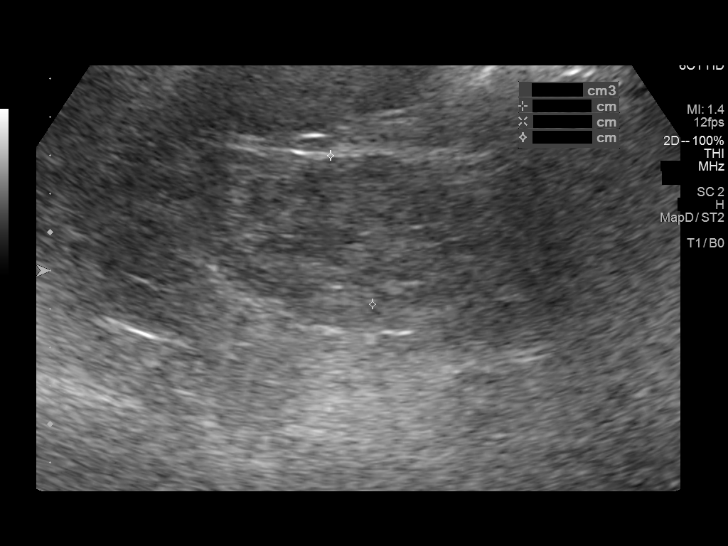
[im 6/21]
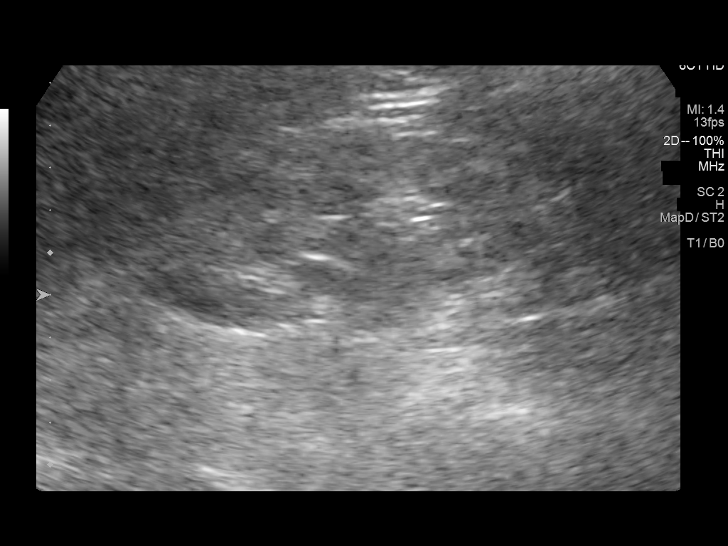
[im 7/21]
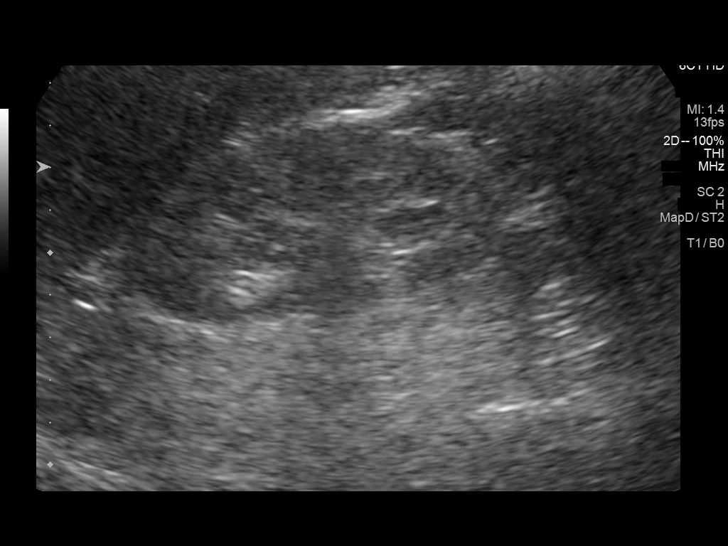
[im 9/21]
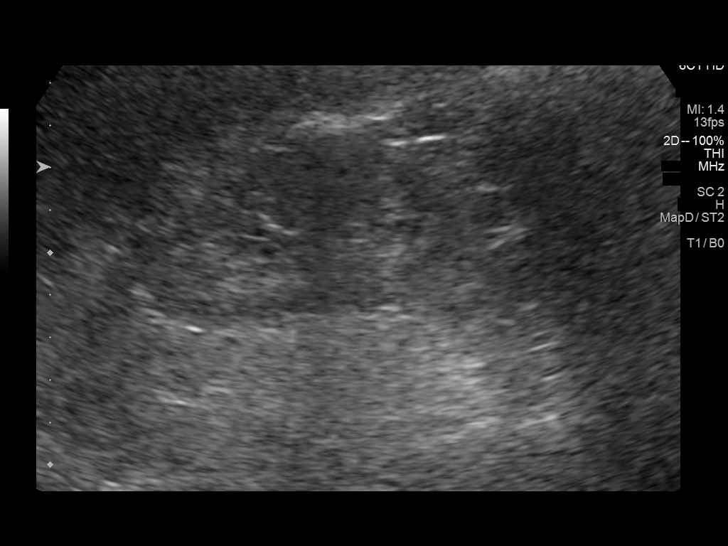
[im 10/21]
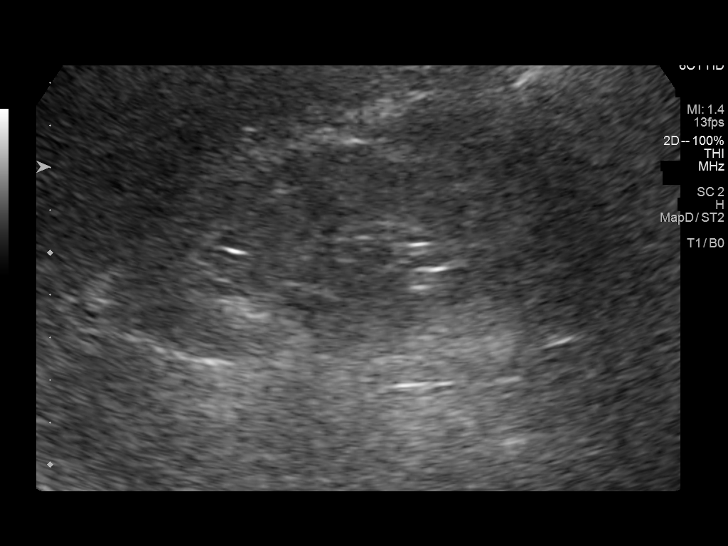
[im 12/21]
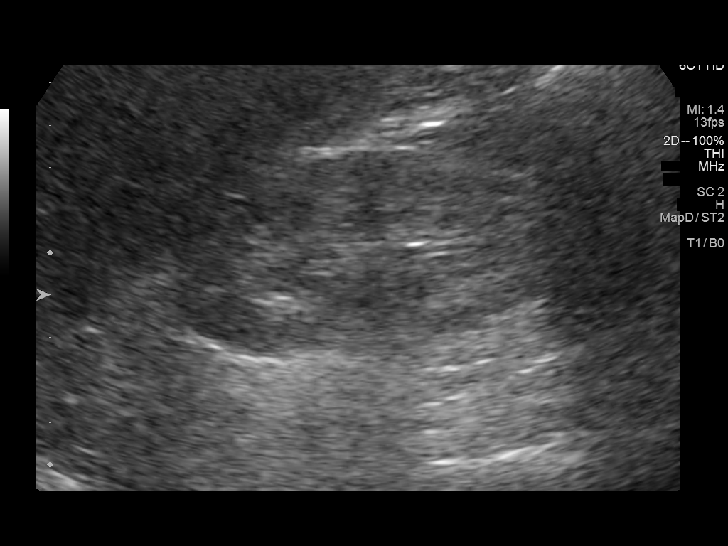
[im 13/21]
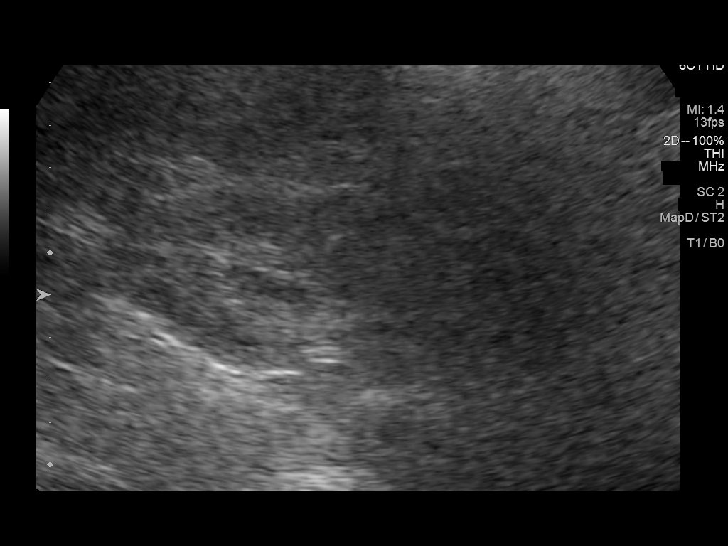
[im 15/21]
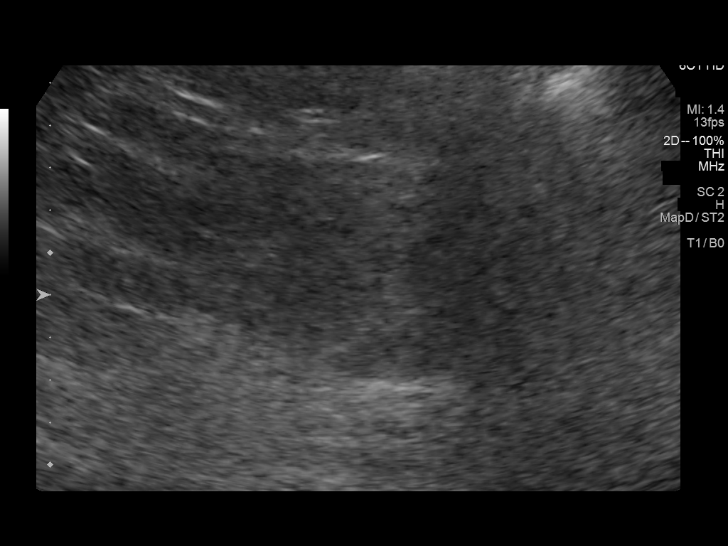
[im 16/21]
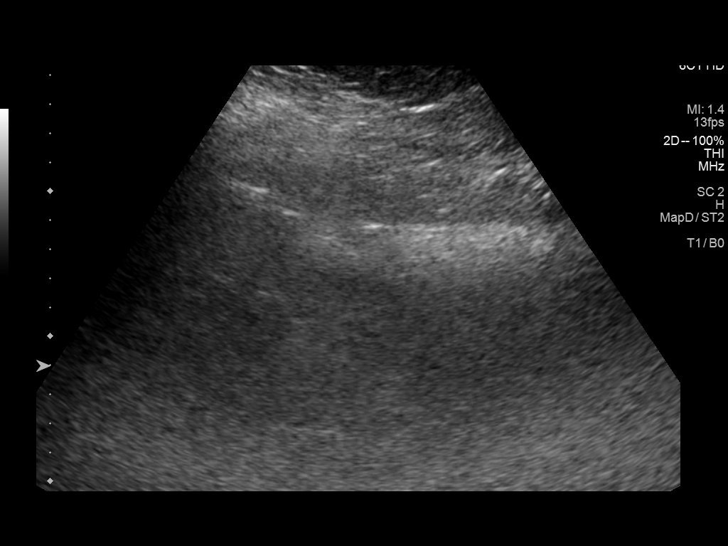
[im 18/21]
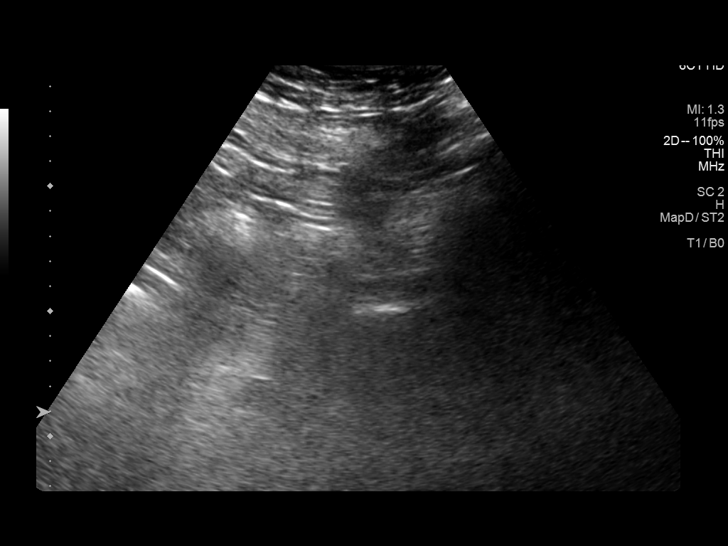
[im 19/21]
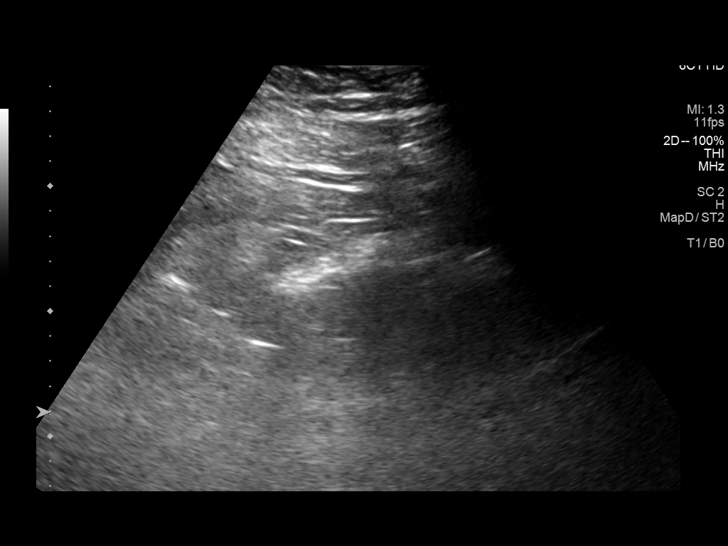
[im 21/21]
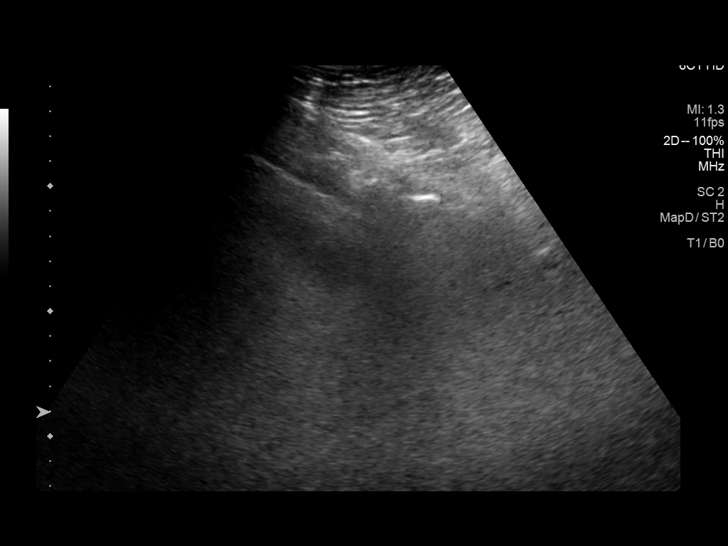

[14 of 21 positions shown; findings below may reference images not displayed]

FINDINGS: Technically challenging exam due to patient body habitus.

Right Kidney:

Renal measurements: 8.9 x 4.5 x 4.0 cm = volume: 83 mL. There is
increased renal echogenicity. No hydronephrosis visualized. No gross
renal mass.

Left Kidney:

Not visualized due to body habitus.

Bladder:

Empty and not well assessed.

Other:

None.
IMPRESSION: 1. Decreased right renal size since 6408 with increased renal
echogenicity. Findings consistent with chronic medical renal
disease. No hydronephrosis.
2. Left kidney not visualized sonographically due to habitus.
3. Urinary bladder is empty and not well assessed.

## 2020-06-10 DIAGNOSIS — N2581 Secondary hyperparathyroidism of renal origin: Secondary | ICD-10-CM | POA: Diagnosis not present

## 2020-06-10 DIAGNOSIS — Z992 Dependence on renal dialysis: Secondary | ICD-10-CM | POA: Diagnosis not present

## 2020-06-10 DIAGNOSIS — N186 End stage renal disease: Secondary | ICD-10-CM | POA: Diagnosis not present

## 2020-06-13 DIAGNOSIS — Z992 Dependence on renal dialysis: Secondary | ICD-10-CM | POA: Diagnosis not present

## 2020-06-13 DIAGNOSIS — N186 End stage renal disease: Secondary | ICD-10-CM | POA: Diagnosis not present

## 2020-06-13 DIAGNOSIS — N2581 Secondary hyperparathyroidism of renal origin: Secondary | ICD-10-CM | POA: Diagnosis not present

## 2020-06-15 DIAGNOSIS — Z992 Dependence on renal dialysis: Secondary | ICD-10-CM | POA: Diagnosis not present

## 2020-06-15 DIAGNOSIS — N2581 Secondary hyperparathyroidism of renal origin: Secondary | ICD-10-CM | POA: Diagnosis not present

## 2020-06-15 DIAGNOSIS — N186 End stage renal disease: Secondary | ICD-10-CM | POA: Diagnosis not present

## 2020-06-17 DIAGNOSIS — N186 End stage renal disease: Secondary | ICD-10-CM | POA: Diagnosis not present

## 2020-06-17 DIAGNOSIS — Z992 Dependence on renal dialysis: Secondary | ICD-10-CM | POA: Diagnosis not present

## 2020-06-17 DIAGNOSIS — N2581 Secondary hyperparathyroidism of renal origin: Secondary | ICD-10-CM | POA: Diagnosis not present

## 2020-06-20 DIAGNOSIS — Z992 Dependence on renal dialysis: Secondary | ICD-10-CM | POA: Diagnosis not present

## 2020-06-20 DIAGNOSIS — N186 End stage renal disease: Secondary | ICD-10-CM | POA: Diagnosis not present

## 2020-06-20 DIAGNOSIS — N2581 Secondary hyperparathyroidism of renal origin: Secondary | ICD-10-CM | POA: Diagnosis not present

## 2020-06-22 DIAGNOSIS — Z992 Dependence on renal dialysis: Secondary | ICD-10-CM | POA: Diagnosis not present

## 2020-06-22 DIAGNOSIS — N2581 Secondary hyperparathyroidism of renal origin: Secondary | ICD-10-CM | POA: Diagnosis not present

## 2020-06-22 DIAGNOSIS — N186 End stage renal disease: Secondary | ICD-10-CM | POA: Diagnosis not present

## 2020-06-24 DIAGNOSIS — N2581 Secondary hyperparathyroidism of renal origin: Secondary | ICD-10-CM | POA: Diagnosis not present

## 2020-06-24 DIAGNOSIS — N186 End stage renal disease: Secondary | ICD-10-CM | POA: Diagnosis not present

## 2020-06-24 DIAGNOSIS — Z992 Dependence on renal dialysis: Secondary | ICD-10-CM | POA: Diagnosis not present

## 2020-06-26 DIAGNOSIS — Z992 Dependence on renal dialysis: Secondary | ICD-10-CM | POA: Diagnosis not present

## 2020-06-26 DIAGNOSIS — N186 End stage renal disease: Secondary | ICD-10-CM | POA: Diagnosis not present

## 2020-06-26 DIAGNOSIS — E1122 Type 2 diabetes mellitus with diabetic chronic kidney disease: Secondary | ICD-10-CM | POA: Diagnosis not present

## 2020-06-27 DIAGNOSIS — Z992 Dependence on renal dialysis: Secondary | ICD-10-CM | POA: Diagnosis not present

## 2020-06-27 DIAGNOSIS — N2581 Secondary hyperparathyroidism of renal origin: Secondary | ICD-10-CM | POA: Diagnosis not present

## 2020-06-27 DIAGNOSIS — N186 End stage renal disease: Secondary | ICD-10-CM | POA: Diagnosis not present

## 2020-06-29 DIAGNOSIS — Z992 Dependence on renal dialysis: Secondary | ICD-10-CM | POA: Diagnosis not present

## 2020-06-29 DIAGNOSIS — N186 End stage renal disease: Secondary | ICD-10-CM | POA: Diagnosis not present

## 2020-06-29 DIAGNOSIS — N2581 Secondary hyperparathyroidism of renal origin: Secondary | ICD-10-CM | POA: Diagnosis not present

## 2020-07-01 DIAGNOSIS — N2581 Secondary hyperparathyroidism of renal origin: Secondary | ICD-10-CM | POA: Diagnosis not present

## 2020-07-01 DIAGNOSIS — N186 End stage renal disease: Secondary | ICD-10-CM | POA: Diagnosis not present

## 2020-07-01 DIAGNOSIS — Z992 Dependence on renal dialysis: Secondary | ICD-10-CM | POA: Diagnosis not present

## 2020-07-04 DIAGNOSIS — N186 End stage renal disease: Secondary | ICD-10-CM | POA: Diagnosis not present

## 2020-07-04 DIAGNOSIS — N2581 Secondary hyperparathyroidism of renal origin: Secondary | ICD-10-CM | POA: Diagnosis not present

## 2020-07-04 DIAGNOSIS — Z992 Dependence on renal dialysis: Secondary | ICD-10-CM | POA: Diagnosis not present

## 2020-07-06 DIAGNOSIS — N2581 Secondary hyperparathyroidism of renal origin: Secondary | ICD-10-CM | POA: Diagnosis not present

## 2020-07-06 DIAGNOSIS — N186 End stage renal disease: Secondary | ICD-10-CM | POA: Diagnosis not present

## 2020-07-06 DIAGNOSIS — Z992 Dependence on renal dialysis: Secondary | ICD-10-CM | POA: Diagnosis not present

## 2020-07-08 DIAGNOSIS — N186 End stage renal disease: Secondary | ICD-10-CM | POA: Diagnosis not present

## 2020-07-08 DIAGNOSIS — N2581 Secondary hyperparathyroidism of renal origin: Secondary | ICD-10-CM | POA: Diagnosis not present

## 2020-07-08 DIAGNOSIS — Z992 Dependence on renal dialysis: Secondary | ICD-10-CM | POA: Diagnosis not present

## 2020-07-11 DIAGNOSIS — N186 End stage renal disease: Secondary | ICD-10-CM | POA: Diagnosis not present

## 2020-07-11 DIAGNOSIS — Z992 Dependence on renal dialysis: Secondary | ICD-10-CM | POA: Diagnosis not present

## 2020-07-11 DIAGNOSIS — N2581 Secondary hyperparathyroidism of renal origin: Secondary | ICD-10-CM | POA: Diagnosis not present

## 2020-07-13 DIAGNOSIS — N186 End stage renal disease: Secondary | ICD-10-CM | POA: Diagnosis not present

## 2020-07-13 DIAGNOSIS — Z992 Dependence on renal dialysis: Secondary | ICD-10-CM | POA: Diagnosis not present

## 2020-07-13 DIAGNOSIS — N2581 Secondary hyperparathyroidism of renal origin: Secondary | ICD-10-CM | POA: Diagnosis not present

## 2020-07-15 DIAGNOSIS — N2581 Secondary hyperparathyroidism of renal origin: Secondary | ICD-10-CM | POA: Diagnosis not present

## 2020-07-15 DIAGNOSIS — Z992 Dependence on renal dialysis: Secondary | ICD-10-CM | POA: Diagnosis not present

## 2020-07-15 DIAGNOSIS — N186 End stage renal disease: Secondary | ICD-10-CM | POA: Diagnosis not present

## 2020-07-17 DIAGNOSIS — Z992 Dependence on renal dialysis: Secondary | ICD-10-CM | POA: Diagnosis not present

## 2020-07-17 DIAGNOSIS — N186 End stage renal disease: Secondary | ICD-10-CM | POA: Diagnosis not present

## 2020-07-17 DIAGNOSIS — I871 Compression of vein: Secondary | ICD-10-CM | POA: Diagnosis not present

## 2020-07-18 DIAGNOSIS — Z992 Dependence on renal dialysis: Secondary | ICD-10-CM | POA: Diagnosis not present

## 2020-07-18 DIAGNOSIS — N2581 Secondary hyperparathyroidism of renal origin: Secondary | ICD-10-CM | POA: Diagnosis not present

## 2020-07-18 DIAGNOSIS — N186 End stage renal disease: Secondary | ICD-10-CM | POA: Diagnosis not present

## 2020-07-20 DIAGNOSIS — N2581 Secondary hyperparathyroidism of renal origin: Secondary | ICD-10-CM | POA: Diagnosis not present

## 2020-07-20 DIAGNOSIS — Z992 Dependence on renal dialysis: Secondary | ICD-10-CM | POA: Diagnosis not present

## 2020-07-20 DIAGNOSIS — N186 End stage renal disease: Secondary | ICD-10-CM | POA: Diagnosis not present

## 2020-07-22 DIAGNOSIS — Z992 Dependence on renal dialysis: Secondary | ICD-10-CM | POA: Diagnosis not present

## 2020-07-22 DIAGNOSIS — N2581 Secondary hyperparathyroidism of renal origin: Secondary | ICD-10-CM | POA: Diagnosis not present

## 2020-07-22 DIAGNOSIS — N186 End stage renal disease: Secondary | ICD-10-CM | POA: Diagnosis not present

## 2020-07-24 DIAGNOSIS — N186 End stage renal disease: Secondary | ICD-10-CM | POA: Diagnosis not present

## 2020-07-24 DIAGNOSIS — E1122 Type 2 diabetes mellitus with diabetic chronic kidney disease: Secondary | ICD-10-CM | POA: Diagnosis not present

## 2020-07-24 DIAGNOSIS — Z992 Dependence on renal dialysis: Secondary | ICD-10-CM | POA: Diagnosis not present

## 2020-07-25 DIAGNOSIS — N186 End stage renal disease: Secondary | ICD-10-CM | POA: Diagnosis not present

## 2020-07-25 DIAGNOSIS — N2581 Secondary hyperparathyroidism of renal origin: Secondary | ICD-10-CM | POA: Diagnosis not present

## 2020-07-25 DIAGNOSIS — Z992 Dependence on renal dialysis: Secondary | ICD-10-CM | POA: Diagnosis not present

## 2020-07-26 DIAGNOSIS — H40021 Open angle with borderline findings, high risk, right eye: Secondary | ICD-10-CM | POA: Diagnosis not present

## 2020-07-26 DIAGNOSIS — E119 Type 2 diabetes mellitus without complications: Secondary | ICD-10-CM | POA: Diagnosis not present

## 2020-07-26 DIAGNOSIS — H2513 Age-related nuclear cataract, bilateral: Secondary | ICD-10-CM | POA: Diagnosis not present

## 2020-07-26 DIAGNOSIS — H10413 Chronic giant papillary conjunctivitis, bilateral: Secondary | ICD-10-CM | POA: Diagnosis not present

## 2020-07-26 DIAGNOSIS — H401123 Primary open-angle glaucoma, left eye, severe stage: Secondary | ICD-10-CM | POA: Diagnosis not present

## 2020-07-27 DIAGNOSIS — N2581 Secondary hyperparathyroidism of renal origin: Secondary | ICD-10-CM | POA: Diagnosis not present

## 2020-07-27 DIAGNOSIS — N186 End stage renal disease: Secondary | ICD-10-CM | POA: Diagnosis not present

## 2020-07-27 DIAGNOSIS — Z992 Dependence on renal dialysis: Secondary | ICD-10-CM | POA: Diagnosis not present

## 2020-07-29 DIAGNOSIS — Z992 Dependence on renal dialysis: Secondary | ICD-10-CM | POA: Diagnosis not present

## 2020-07-29 DIAGNOSIS — N186 End stage renal disease: Secondary | ICD-10-CM | POA: Diagnosis not present

## 2020-07-29 DIAGNOSIS — N2581 Secondary hyperparathyroidism of renal origin: Secondary | ICD-10-CM | POA: Diagnosis not present

## 2020-08-01 DIAGNOSIS — Z992 Dependence on renal dialysis: Secondary | ICD-10-CM | POA: Diagnosis not present

## 2020-08-01 DIAGNOSIS — N186 End stage renal disease: Secondary | ICD-10-CM | POA: Diagnosis not present

## 2020-08-01 DIAGNOSIS — N2581 Secondary hyperparathyroidism of renal origin: Secondary | ICD-10-CM | POA: Diagnosis not present

## 2020-08-03 DIAGNOSIS — Z992 Dependence on renal dialysis: Secondary | ICD-10-CM | POA: Diagnosis not present

## 2020-08-03 DIAGNOSIS — N186 End stage renal disease: Secondary | ICD-10-CM | POA: Diagnosis not present

## 2020-08-03 DIAGNOSIS — N2581 Secondary hyperparathyroidism of renal origin: Secondary | ICD-10-CM | POA: Diagnosis not present

## 2020-08-05 DIAGNOSIS — N2581 Secondary hyperparathyroidism of renal origin: Secondary | ICD-10-CM | POA: Diagnosis not present

## 2020-08-05 DIAGNOSIS — Z992 Dependence on renal dialysis: Secondary | ICD-10-CM | POA: Diagnosis not present

## 2020-08-05 DIAGNOSIS — N186 End stage renal disease: Secondary | ICD-10-CM | POA: Diagnosis not present

## 2020-08-08 DIAGNOSIS — N186 End stage renal disease: Secondary | ICD-10-CM | POA: Diagnosis not present

## 2020-08-08 DIAGNOSIS — Z992 Dependence on renal dialysis: Secondary | ICD-10-CM | POA: Diagnosis not present

## 2020-08-08 DIAGNOSIS — N2581 Secondary hyperparathyroidism of renal origin: Secondary | ICD-10-CM | POA: Diagnosis not present

## 2020-08-09 DIAGNOSIS — M109 Gout, unspecified: Secondary | ICD-10-CM | POA: Diagnosis not present

## 2020-08-09 DIAGNOSIS — I5042 Chronic combined systolic (congestive) and diastolic (congestive) heart failure: Secondary | ICD-10-CM | POA: Diagnosis not present

## 2020-08-09 DIAGNOSIS — I10 Essential (primary) hypertension: Secondary | ICD-10-CM | POA: Diagnosis not present

## 2020-08-09 DIAGNOSIS — N186 End stage renal disease: Secondary | ICD-10-CM | POA: Diagnosis not present

## 2020-08-09 DIAGNOSIS — E785 Hyperlipidemia, unspecified: Secondary | ICD-10-CM | POA: Diagnosis not present

## 2020-08-10 DIAGNOSIS — N186 End stage renal disease: Secondary | ICD-10-CM | POA: Diagnosis not present

## 2020-08-10 DIAGNOSIS — N2581 Secondary hyperparathyroidism of renal origin: Secondary | ICD-10-CM | POA: Diagnosis not present

## 2020-08-10 DIAGNOSIS — Z992 Dependence on renal dialysis: Secondary | ICD-10-CM | POA: Diagnosis not present

## 2020-08-12 DIAGNOSIS — N186 End stage renal disease: Secondary | ICD-10-CM | POA: Diagnosis not present

## 2020-08-12 DIAGNOSIS — N2581 Secondary hyperparathyroidism of renal origin: Secondary | ICD-10-CM | POA: Diagnosis not present

## 2020-08-12 DIAGNOSIS — Z992 Dependence on renal dialysis: Secondary | ICD-10-CM | POA: Diagnosis not present

## 2020-08-15 DIAGNOSIS — N2581 Secondary hyperparathyroidism of renal origin: Secondary | ICD-10-CM | POA: Diagnosis not present

## 2020-08-15 DIAGNOSIS — N186 End stage renal disease: Secondary | ICD-10-CM | POA: Diagnosis not present

## 2020-08-15 DIAGNOSIS — Z992 Dependence on renal dialysis: Secondary | ICD-10-CM | POA: Diagnosis not present

## 2020-08-17 DIAGNOSIS — N186 End stage renal disease: Secondary | ICD-10-CM | POA: Diagnosis not present

## 2020-08-17 DIAGNOSIS — N2581 Secondary hyperparathyroidism of renal origin: Secondary | ICD-10-CM | POA: Diagnosis not present

## 2020-08-17 DIAGNOSIS — Z992 Dependence on renal dialysis: Secondary | ICD-10-CM | POA: Diagnosis not present

## 2020-08-19 DIAGNOSIS — Z992 Dependence on renal dialysis: Secondary | ICD-10-CM | POA: Diagnosis not present

## 2020-08-19 DIAGNOSIS — N2581 Secondary hyperparathyroidism of renal origin: Secondary | ICD-10-CM | POA: Diagnosis not present

## 2020-08-19 DIAGNOSIS — N186 End stage renal disease: Secondary | ICD-10-CM | POA: Diagnosis not present

## 2020-08-22 DIAGNOSIS — N2581 Secondary hyperparathyroidism of renal origin: Secondary | ICD-10-CM | POA: Diagnosis not present

## 2020-08-22 DIAGNOSIS — N186 End stage renal disease: Secondary | ICD-10-CM | POA: Diagnosis not present

## 2020-08-22 DIAGNOSIS — Z992 Dependence on renal dialysis: Secondary | ICD-10-CM | POA: Diagnosis not present

## 2020-08-24 DIAGNOSIS — Z992 Dependence on renal dialysis: Secondary | ICD-10-CM | POA: Diagnosis not present

## 2020-08-24 DIAGNOSIS — N186 End stage renal disease: Secondary | ICD-10-CM | POA: Diagnosis not present

## 2020-08-24 DIAGNOSIS — E1122 Type 2 diabetes mellitus with diabetic chronic kidney disease: Secondary | ICD-10-CM | POA: Diagnosis not present

## 2020-08-24 DIAGNOSIS — N2581 Secondary hyperparathyroidism of renal origin: Secondary | ICD-10-CM | POA: Diagnosis not present

## 2020-08-26 ENCOUNTER — Other Ambulatory Visit: Payer: Self-pay | Admitting: Family Medicine

## 2020-08-26 DIAGNOSIS — Z992 Dependence on renal dialysis: Secondary | ICD-10-CM | POA: Diagnosis not present

## 2020-08-26 DIAGNOSIS — N186 End stage renal disease: Secondary | ICD-10-CM | POA: Diagnosis not present

## 2020-08-26 DIAGNOSIS — F0631 Mood disorder due to known physiological condition with depressive features: Secondary | ICD-10-CM

## 2020-08-26 DIAGNOSIS — N2581 Secondary hyperparathyroidism of renal origin: Secondary | ICD-10-CM | POA: Diagnosis not present

## 2020-08-29 DIAGNOSIS — N186 End stage renal disease: Secondary | ICD-10-CM | POA: Diagnosis not present

## 2020-08-29 DIAGNOSIS — J449 Chronic obstructive pulmonary disease, unspecified: Secondary | ICD-10-CM | POA: Diagnosis not present

## 2020-08-29 DIAGNOSIS — E662 Morbid (severe) obesity with alveolar hypoventilation: Secondary | ICD-10-CM | POA: Diagnosis not present

## 2020-08-29 DIAGNOSIS — Z992 Dependence on renal dialysis: Secondary | ICD-10-CM | POA: Diagnosis not present

## 2020-08-29 DIAGNOSIS — N2581 Secondary hyperparathyroidism of renal origin: Secondary | ICD-10-CM | POA: Diagnosis not present

## 2020-08-29 DIAGNOSIS — J9610 Chronic respiratory failure, unspecified whether with hypoxia or hypercapnia: Secondary | ICD-10-CM | POA: Diagnosis not present

## 2020-08-31 DIAGNOSIS — N186 End stage renal disease: Secondary | ICD-10-CM | POA: Diagnosis not present

## 2020-08-31 DIAGNOSIS — N2581 Secondary hyperparathyroidism of renal origin: Secondary | ICD-10-CM | POA: Diagnosis not present

## 2020-08-31 DIAGNOSIS — Z992 Dependence on renal dialysis: Secondary | ICD-10-CM | POA: Diagnosis not present

## 2020-09-02 DIAGNOSIS — N2581 Secondary hyperparathyroidism of renal origin: Secondary | ICD-10-CM | POA: Diagnosis not present

## 2020-09-02 DIAGNOSIS — N186 End stage renal disease: Secondary | ICD-10-CM | POA: Diagnosis not present

## 2020-09-02 DIAGNOSIS — Z992 Dependence on renal dialysis: Secondary | ICD-10-CM | POA: Diagnosis not present

## 2020-09-02 IMAGING — DX DG CHEST 1V PORT
1 series · 1 of 1 positions shown · non-contrast
Comparison: 06/30/2019

CLINICAL DATA: Postoperative state. Insertion of tunneled dialysis
catheter.

EXAM:
PORTABLE CHEST 1 VIEW

[chest]
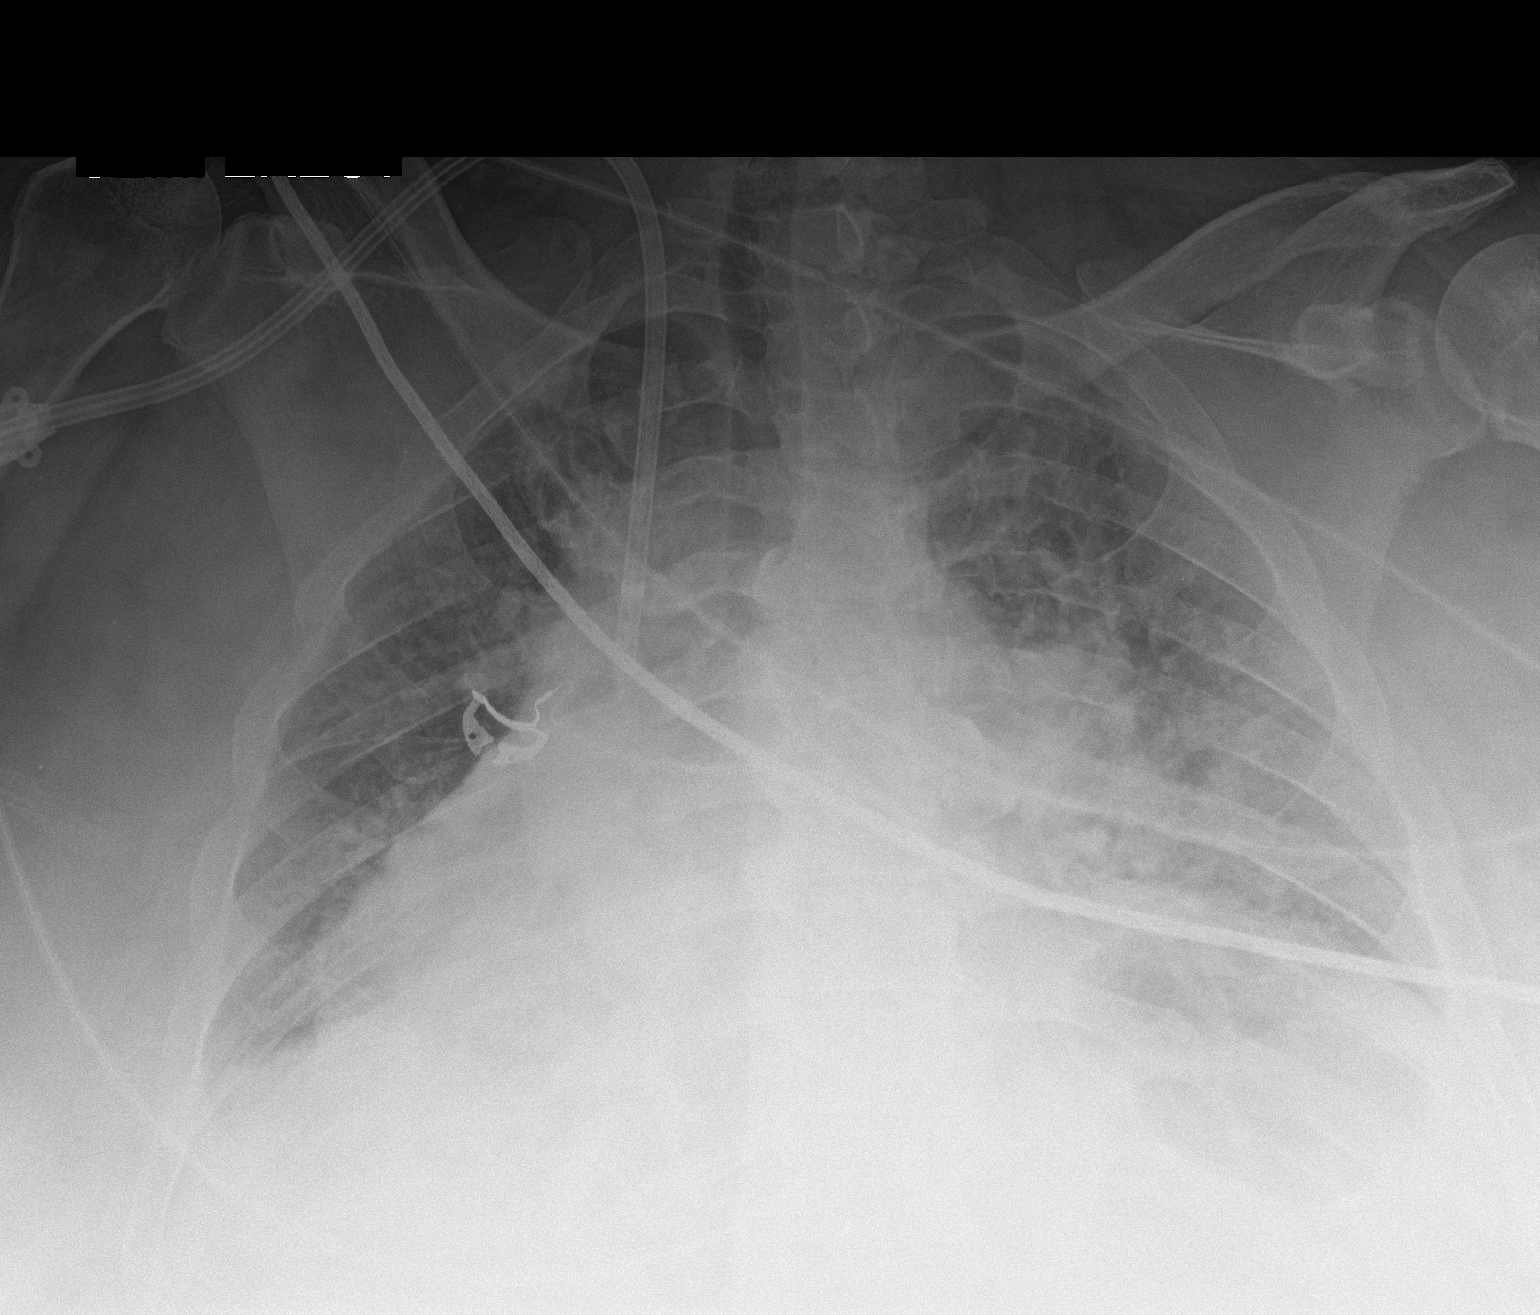

[1 of 1 positions shown; findings below may reference images not displayed]

FINDINGS: Double lumen central venous catheter appears in good position in the
superior vena cava at the level of the carina. The patient has
chronic prominence of the right heart and pulmonary arteries as
demonstrated on the prior CT scan of 09/12/2006.

There is slight vascular congestion on the left. No pneumothorax. No
acute bone abnormality.
IMPRESSION: Central venous catheter in good position. No pneumothorax. Chronic
prominence of the right heart and pulmonary arteries.

## 2020-09-05 DIAGNOSIS — Z992 Dependence on renal dialysis: Secondary | ICD-10-CM | POA: Diagnosis not present

## 2020-09-05 DIAGNOSIS — N2581 Secondary hyperparathyroidism of renal origin: Secondary | ICD-10-CM | POA: Diagnosis not present

## 2020-09-05 DIAGNOSIS — N186 End stage renal disease: Secondary | ICD-10-CM | POA: Diagnosis not present

## 2020-09-07 DIAGNOSIS — Z992 Dependence on renal dialysis: Secondary | ICD-10-CM | POA: Diagnosis not present

## 2020-09-07 DIAGNOSIS — N186 End stage renal disease: Secondary | ICD-10-CM | POA: Diagnosis not present

## 2020-09-07 DIAGNOSIS — N2581 Secondary hyperparathyroidism of renal origin: Secondary | ICD-10-CM | POA: Diagnosis not present

## 2020-09-09 DIAGNOSIS — Z992 Dependence on renal dialysis: Secondary | ICD-10-CM | POA: Diagnosis not present

## 2020-09-09 DIAGNOSIS — N2581 Secondary hyperparathyroidism of renal origin: Secondary | ICD-10-CM | POA: Diagnosis not present

## 2020-09-09 DIAGNOSIS — N186 End stage renal disease: Secondary | ICD-10-CM | POA: Diagnosis not present

## 2020-09-12 DIAGNOSIS — N2581 Secondary hyperparathyroidism of renal origin: Secondary | ICD-10-CM | POA: Diagnosis not present

## 2020-09-12 DIAGNOSIS — Z992 Dependence on renal dialysis: Secondary | ICD-10-CM | POA: Diagnosis not present

## 2020-09-12 DIAGNOSIS — N186 End stage renal disease: Secondary | ICD-10-CM | POA: Diagnosis not present

## 2020-09-14 DIAGNOSIS — N2581 Secondary hyperparathyroidism of renal origin: Secondary | ICD-10-CM | POA: Diagnosis not present

## 2020-09-14 DIAGNOSIS — N186 End stage renal disease: Secondary | ICD-10-CM | POA: Diagnosis not present

## 2020-09-14 DIAGNOSIS — Z992 Dependence on renal dialysis: Secondary | ICD-10-CM | POA: Diagnosis not present

## 2020-09-16 DIAGNOSIS — N186 End stage renal disease: Secondary | ICD-10-CM | POA: Diagnosis not present

## 2020-09-16 DIAGNOSIS — N2581 Secondary hyperparathyroidism of renal origin: Secondary | ICD-10-CM | POA: Diagnosis not present

## 2020-09-16 DIAGNOSIS — Z992 Dependence on renal dialysis: Secondary | ICD-10-CM | POA: Diagnosis not present

## 2020-09-19 DIAGNOSIS — N186 End stage renal disease: Secondary | ICD-10-CM | POA: Diagnosis not present

## 2020-09-19 DIAGNOSIS — N2581 Secondary hyperparathyroidism of renal origin: Secondary | ICD-10-CM | POA: Diagnosis not present

## 2020-09-19 DIAGNOSIS — Z992 Dependence on renal dialysis: Secondary | ICD-10-CM | POA: Diagnosis not present

## 2020-09-21 DIAGNOSIS — N186 End stage renal disease: Secondary | ICD-10-CM | POA: Diagnosis not present

## 2020-09-21 DIAGNOSIS — N2581 Secondary hyperparathyroidism of renal origin: Secondary | ICD-10-CM | POA: Diagnosis not present

## 2020-09-21 DIAGNOSIS — Z992 Dependence on renal dialysis: Secondary | ICD-10-CM | POA: Diagnosis not present

## 2020-09-23 DIAGNOSIS — E1122 Type 2 diabetes mellitus with diabetic chronic kidney disease: Secondary | ICD-10-CM | POA: Diagnosis not present

## 2020-09-23 DIAGNOSIS — Z992 Dependence on renal dialysis: Secondary | ICD-10-CM | POA: Diagnosis not present

## 2020-09-23 DIAGNOSIS — N2581 Secondary hyperparathyroidism of renal origin: Secondary | ICD-10-CM | POA: Diagnosis not present

## 2020-09-23 DIAGNOSIS — N186 End stage renal disease: Secondary | ICD-10-CM | POA: Diagnosis not present

## 2020-09-26 DIAGNOSIS — N2581 Secondary hyperparathyroidism of renal origin: Secondary | ICD-10-CM | POA: Diagnosis not present

## 2020-09-26 DIAGNOSIS — Z992 Dependence on renal dialysis: Secondary | ICD-10-CM | POA: Diagnosis not present

## 2020-09-26 DIAGNOSIS — N186 End stage renal disease: Secondary | ICD-10-CM | POA: Diagnosis not present

## 2020-09-28 DIAGNOSIS — N2581 Secondary hyperparathyroidism of renal origin: Secondary | ICD-10-CM | POA: Diagnosis not present

## 2020-09-28 DIAGNOSIS — N186 End stage renal disease: Secondary | ICD-10-CM | POA: Diagnosis not present

## 2020-09-28 DIAGNOSIS — Z992 Dependence on renal dialysis: Secondary | ICD-10-CM | POA: Diagnosis not present

## 2020-09-30 DIAGNOSIS — Z992 Dependence on renal dialysis: Secondary | ICD-10-CM | POA: Diagnosis not present

## 2020-09-30 DIAGNOSIS — N186 End stage renal disease: Secondary | ICD-10-CM | POA: Diagnosis not present

## 2020-09-30 DIAGNOSIS — N2581 Secondary hyperparathyroidism of renal origin: Secondary | ICD-10-CM | POA: Diagnosis not present

## 2020-10-03 DIAGNOSIS — Z992 Dependence on renal dialysis: Secondary | ICD-10-CM | POA: Diagnosis not present

## 2020-10-03 DIAGNOSIS — N186 End stage renal disease: Secondary | ICD-10-CM | POA: Diagnosis not present

## 2020-10-03 DIAGNOSIS — N2581 Secondary hyperparathyroidism of renal origin: Secondary | ICD-10-CM | POA: Diagnosis not present

## 2020-10-05 DIAGNOSIS — N186 End stage renal disease: Secondary | ICD-10-CM | POA: Diagnosis not present

## 2020-10-05 DIAGNOSIS — Z992 Dependence on renal dialysis: Secondary | ICD-10-CM | POA: Diagnosis not present

## 2020-10-05 DIAGNOSIS — N2581 Secondary hyperparathyroidism of renal origin: Secondary | ICD-10-CM | POA: Diagnosis not present

## 2020-10-07 DIAGNOSIS — Z992 Dependence on renal dialysis: Secondary | ICD-10-CM | POA: Diagnosis not present

## 2020-10-07 DIAGNOSIS — N186 End stage renal disease: Secondary | ICD-10-CM | POA: Diagnosis not present

## 2020-10-07 DIAGNOSIS — N2581 Secondary hyperparathyroidism of renal origin: Secondary | ICD-10-CM | POA: Diagnosis not present

## 2020-10-10 DIAGNOSIS — N2581 Secondary hyperparathyroidism of renal origin: Secondary | ICD-10-CM | POA: Diagnosis not present

## 2020-10-10 DIAGNOSIS — Z992 Dependence on renal dialysis: Secondary | ICD-10-CM | POA: Diagnosis not present

## 2020-10-10 DIAGNOSIS — N186 End stage renal disease: Secondary | ICD-10-CM | POA: Diagnosis not present

## 2020-10-12 DIAGNOSIS — N186 End stage renal disease: Secondary | ICD-10-CM | POA: Diagnosis not present

## 2020-10-12 DIAGNOSIS — Z992 Dependence on renal dialysis: Secondary | ICD-10-CM | POA: Diagnosis not present

## 2020-10-12 DIAGNOSIS — N2581 Secondary hyperparathyroidism of renal origin: Secondary | ICD-10-CM | POA: Diagnosis not present

## 2020-10-14 DIAGNOSIS — N2581 Secondary hyperparathyroidism of renal origin: Secondary | ICD-10-CM | POA: Diagnosis not present

## 2020-10-14 DIAGNOSIS — Z992 Dependence on renal dialysis: Secondary | ICD-10-CM | POA: Diagnosis not present

## 2020-10-14 DIAGNOSIS — N186 End stage renal disease: Secondary | ICD-10-CM | POA: Diagnosis not present

## 2020-10-17 DIAGNOSIS — N2581 Secondary hyperparathyroidism of renal origin: Secondary | ICD-10-CM | POA: Diagnosis not present

## 2020-10-17 DIAGNOSIS — N186 End stage renal disease: Secondary | ICD-10-CM | POA: Diagnosis not present

## 2020-10-17 DIAGNOSIS — Z992 Dependence on renal dialysis: Secondary | ICD-10-CM | POA: Diagnosis not present

## 2020-10-19 DIAGNOSIS — Z992 Dependence on renal dialysis: Secondary | ICD-10-CM | POA: Diagnosis not present

## 2020-10-19 DIAGNOSIS — N186 End stage renal disease: Secondary | ICD-10-CM | POA: Diagnosis not present

## 2020-10-19 DIAGNOSIS — N2581 Secondary hyperparathyroidism of renal origin: Secondary | ICD-10-CM | POA: Diagnosis not present

## 2020-10-21 DIAGNOSIS — Z992 Dependence on renal dialysis: Secondary | ICD-10-CM | POA: Diagnosis not present

## 2020-10-21 DIAGNOSIS — N186 End stage renal disease: Secondary | ICD-10-CM | POA: Diagnosis not present

## 2020-10-21 DIAGNOSIS — N2581 Secondary hyperparathyroidism of renal origin: Secondary | ICD-10-CM | POA: Diagnosis not present

## 2020-10-24 DIAGNOSIS — E1122 Type 2 diabetes mellitus with diabetic chronic kidney disease: Secondary | ICD-10-CM | POA: Diagnosis not present

## 2020-10-24 DIAGNOSIS — N2581 Secondary hyperparathyroidism of renal origin: Secondary | ICD-10-CM | POA: Diagnosis not present

## 2020-10-24 DIAGNOSIS — N186 End stage renal disease: Secondary | ICD-10-CM | POA: Diagnosis not present

## 2020-10-24 DIAGNOSIS — Z992 Dependence on renal dialysis: Secondary | ICD-10-CM | POA: Diagnosis not present

## 2020-10-26 DIAGNOSIS — N186 End stage renal disease: Secondary | ICD-10-CM | POA: Diagnosis not present

## 2020-10-26 DIAGNOSIS — Z992 Dependence on renal dialysis: Secondary | ICD-10-CM | POA: Diagnosis not present

## 2020-10-26 DIAGNOSIS — N2581 Secondary hyperparathyroidism of renal origin: Secondary | ICD-10-CM | POA: Diagnosis not present

## 2020-10-28 DIAGNOSIS — N186 End stage renal disease: Secondary | ICD-10-CM | POA: Diagnosis not present

## 2020-10-28 DIAGNOSIS — Z992 Dependence on renal dialysis: Secondary | ICD-10-CM | POA: Diagnosis not present

## 2020-10-28 DIAGNOSIS — N2581 Secondary hyperparathyroidism of renal origin: Secondary | ICD-10-CM | POA: Diagnosis not present

## 2020-10-31 DIAGNOSIS — N186 End stage renal disease: Secondary | ICD-10-CM | POA: Diagnosis not present

## 2020-10-31 DIAGNOSIS — N2581 Secondary hyperparathyroidism of renal origin: Secondary | ICD-10-CM | POA: Diagnosis not present

## 2020-10-31 DIAGNOSIS — Z992 Dependence on renal dialysis: Secondary | ICD-10-CM | POA: Diagnosis not present

## 2020-11-02 DIAGNOSIS — N186 End stage renal disease: Secondary | ICD-10-CM | POA: Diagnosis not present

## 2020-11-02 DIAGNOSIS — Z992 Dependence on renal dialysis: Secondary | ICD-10-CM | POA: Diagnosis not present

## 2020-11-02 DIAGNOSIS — N2581 Secondary hyperparathyroidism of renal origin: Secondary | ICD-10-CM | POA: Diagnosis not present

## 2020-11-04 DIAGNOSIS — N2581 Secondary hyperparathyroidism of renal origin: Secondary | ICD-10-CM | POA: Diagnosis not present

## 2020-11-04 DIAGNOSIS — Z992 Dependence on renal dialysis: Secondary | ICD-10-CM | POA: Diagnosis not present

## 2020-11-04 DIAGNOSIS — N186 End stage renal disease: Secondary | ICD-10-CM | POA: Diagnosis not present

## 2020-11-07 DIAGNOSIS — N2581 Secondary hyperparathyroidism of renal origin: Secondary | ICD-10-CM | POA: Diagnosis not present

## 2020-11-07 DIAGNOSIS — N186 End stage renal disease: Secondary | ICD-10-CM | POA: Diagnosis not present

## 2020-11-07 DIAGNOSIS — Z992 Dependence on renal dialysis: Secondary | ICD-10-CM | POA: Diagnosis not present

## 2020-11-09 DIAGNOSIS — N2581 Secondary hyperparathyroidism of renal origin: Secondary | ICD-10-CM | POA: Diagnosis not present

## 2020-11-09 DIAGNOSIS — N186 End stage renal disease: Secondary | ICD-10-CM | POA: Diagnosis not present

## 2020-11-09 DIAGNOSIS — Z992 Dependence on renal dialysis: Secondary | ICD-10-CM | POA: Diagnosis not present

## 2020-11-11 DIAGNOSIS — N186 End stage renal disease: Secondary | ICD-10-CM | POA: Diagnosis not present

## 2020-11-11 DIAGNOSIS — Z992 Dependence on renal dialysis: Secondary | ICD-10-CM | POA: Diagnosis not present

## 2020-11-11 DIAGNOSIS — N2581 Secondary hyperparathyroidism of renal origin: Secondary | ICD-10-CM | POA: Diagnosis not present

## 2020-11-14 DIAGNOSIS — N2581 Secondary hyperparathyroidism of renal origin: Secondary | ICD-10-CM | POA: Diagnosis not present

## 2020-11-14 DIAGNOSIS — N186 End stage renal disease: Secondary | ICD-10-CM | POA: Diagnosis not present

## 2020-11-14 DIAGNOSIS — Z992 Dependence on renal dialysis: Secondary | ICD-10-CM | POA: Diagnosis not present

## 2020-11-16 DIAGNOSIS — N186 End stage renal disease: Secondary | ICD-10-CM | POA: Diagnosis not present

## 2020-11-16 DIAGNOSIS — Z992 Dependence on renal dialysis: Secondary | ICD-10-CM | POA: Diagnosis not present

## 2020-11-16 DIAGNOSIS — N2581 Secondary hyperparathyroidism of renal origin: Secondary | ICD-10-CM | POA: Diagnosis not present

## 2020-11-18 DIAGNOSIS — N2581 Secondary hyperparathyroidism of renal origin: Secondary | ICD-10-CM | POA: Diagnosis not present

## 2020-11-18 DIAGNOSIS — N186 End stage renal disease: Secondary | ICD-10-CM | POA: Diagnosis not present

## 2020-11-18 DIAGNOSIS — Z992 Dependence on renal dialysis: Secondary | ICD-10-CM | POA: Diagnosis not present

## 2020-11-21 DIAGNOSIS — Z992 Dependence on renal dialysis: Secondary | ICD-10-CM | POA: Diagnosis not present

## 2020-11-21 DIAGNOSIS — N2581 Secondary hyperparathyroidism of renal origin: Secondary | ICD-10-CM | POA: Diagnosis not present

## 2020-11-21 DIAGNOSIS — N186 End stage renal disease: Secondary | ICD-10-CM | POA: Diagnosis not present

## 2020-11-23 DIAGNOSIS — E1122 Type 2 diabetes mellitus with diabetic chronic kidney disease: Secondary | ICD-10-CM | POA: Diagnosis not present

## 2020-11-23 DIAGNOSIS — N186 End stage renal disease: Secondary | ICD-10-CM | POA: Diagnosis not present

## 2020-11-23 DIAGNOSIS — Z992 Dependence on renal dialysis: Secondary | ICD-10-CM | POA: Diagnosis not present

## 2020-11-23 DIAGNOSIS — N2581 Secondary hyperparathyroidism of renal origin: Secondary | ICD-10-CM | POA: Diagnosis not present

## 2020-11-24 ENCOUNTER — Other Ambulatory Visit: Payer: Self-pay | Admitting: Family Medicine

## 2020-11-24 DIAGNOSIS — E669 Obesity, unspecified: Secondary | ICD-10-CM

## 2020-11-24 DIAGNOSIS — I10 Essential (primary) hypertension: Secondary | ICD-10-CM

## 2020-11-25 DIAGNOSIS — Z992 Dependence on renal dialysis: Secondary | ICD-10-CM | POA: Diagnosis not present

## 2020-11-25 DIAGNOSIS — N186 End stage renal disease: Secondary | ICD-10-CM | POA: Diagnosis not present

## 2020-11-25 DIAGNOSIS — N2581 Secondary hyperparathyroidism of renal origin: Secondary | ICD-10-CM | POA: Diagnosis not present

## 2020-11-28 DIAGNOSIS — N186 End stage renal disease: Secondary | ICD-10-CM | POA: Diagnosis not present

## 2020-11-28 DIAGNOSIS — N2581 Secondary hyperparathyroidism of renal origin: Secondary | ICD-10-CM | POA: Diagnosis not present

## 2020-11-28 DIAGNOSIS — Z992 Dependence on renal dialysis: Secondary | ICD-10-CM | POA: Diagnosis not present

## 2020-11-29 DIAGNOSIS — I10 Essential (primary) hypertension: Secondary | ICD-10-CM | POA: Diagnosis not present

## 2020-11-29 DIAGNOSIS — I5042 Chronic combined systolic (congestive) and diastolic (congestive) heart failure: Secondary | ICD-10-CM | POA: Diagnosis not present

## 2020-11-29 DIAGNOSIS — M109 Gout, unspecified: Secondary | ICD-10-CM | POA: Diagnosis not present

## 2020-11-29 DIAGNOSIS — E785 Hyperlipidemia, unspecified: Secondary | ICD-10-CM | POA: Diagnosis not present

## 2020-11-30 DIAGNOSIS — N2581 Secondary hyperparathyroidism of renal origin: Secondary | ICD-10-CM | POA: Diagnosis not present

## 2020-11-30 DIAGNOSIS — Z992 Dependence on renal dialysis: Secondary | ICD-10-CM | POA: Diagnosis not present

## 2020-11-30 DIAGNOSIS — N186 End stage renal disease: Secondary | ICD-10-CM | POA: Diagnosis not present

## 2020-12-02 DIAGNOSIS — Z992 Dependence on renal dialysis: Secondary | ICD-10-CM | POA: Diagnosis not present

## 2020-12-02 DIAGNOSIS — N186 End stage renal disease: Secondary | ICD-10-CM | POA: Diagnosis not present

## 2020-12-02 DIAGNOSIS — N2581 Secondary hyperparathyroidism of renal origin: Secondary | ICD-10-CM | POA: Diagnosis not present

## 2020-12-05 DIAGNOSIS — Z992 Dependence on renal dialysis: Secondary | ICD-10-CM | POA: Diagnosis not present

## 2020-12-05 DIAGNOSIS — N186 End stage renal disease: Secondary | ICD-10-CM | POA: Diagnosis not present

## 2020-12-05 DIAGNOSIS — N2581 Secondary hyperparathyroidism of renal origin: Secondary | ICD-10-CM | POA: Diagnosis not present

## 2020-12-07 DIAGNOSIS — Z992 Dependence on renal dialysis: Secondary | ICD-10-CM | POA: Diagnosis not present

## 2020-12-07 DIAGNOSIS — N2581 Secondary hyperparathyroidism of renal origin: Secondary | ICD-10-CM | POA: Diagnosis not present

## 2020-12-07 DIAGNOSIS — N186 End stage renal disease: Secondary | ICD-10-CM | POA: Diagnosis not present

## 2020-12-09 DIAGNOSIS — N2581 Secondary hyperparathyroidism of renal origin: Secondary | ICD-10-CM | POA: Diagnosis not present

## 2020-12-09 DIAGNOSIS — Z992 Dependence on renal dialysis: Secondary | ICD-10-CM | POA: Diagnosis not present

## 2020-12-09 DIAGNOSIS — N186 End stage renal disease: Secondary | ICD-10-CM | POA: Diagnosis not present

## 2020-12-12 DIAGNOSIS — N2581 Secondary hyperparathyroidism of renal origin: Secondary | ICD-10-CM | POA: Diagnosis not present

## 2020-12-12 DIAGNOSIS — Z992 Dependence on renal dialysis: Secondary | ICD-10-CM | POA: Diagnosis not present

## 2020-12-12 DIAGNOSIS — N186 End stage renal disease: Secondary | ICD-10-CM | POA: Diagnosis not present

## 2020-12-14 DIAGNOSIS — E662 Morbid (severe) obesity with alveolar hypoventilation: Secondary | ICD-10-CM | POA: Diagnosis not present

## 2020-12-14 DIAGNOSIS — J9610 Chronic respiratory failure, unspecified whether with hypoxia or hypercapnia: Secondary | ICD-10-CM | POA: Diagnosis not present

## 2020-12-14 DIAGNOSIS — J449 Chronic obstructive pulmonary disease, unspecified: Secondary | ICD-10-CM | POA: Diagnosis not present

## 2020-12-14 DIAGNOSIS — Z992 Dependence on renal dialysis: Secondary | ICD-10-CM | POA: Diagnosis not present

## 2020-12-14 DIAGNOSIS — N186 End stage renal disease: Secondary | ICD-10-CM | POA: Diagnosis not present

## 2020-12-14 DIAGNOSIS — N2581 Secondary hyperparathyroidism of renal origin: Secondary | ICD-10-CM | POA: Diagnosis not present

## 2020-12-16 DIAGNOSIS — N186 End stage renal disease: Secondary | ICD-10-CM | POA: Diagnosis not present

## 2020-12-16 DIAGNOSIS — N2581 Secondary hyperparathyroidism of renal origin: Secondary | ICD-10-CM | POA: Diagnosis not present

## 2020-12-16 DIAGNOSIS — Z992 Dependence on renal dialysis: Secondary | ICD-10-CM | POA: Diagnosis not present

## 2020-12-19 DIAGNOSIS — N186 End stage renal disease: Secondary | ICD-10-CM | POA: Diagnosis not present

## 2020-12-19 DIAGNOSIS — N2581 Secondary hyperparathyroidism of renal origin: Secondary | ICD-10-CM | POA: Diagnosis not present

## 2020-12-19 DIAGNOSIS — Z992 Dependence on renal dialysis: Secondary | ICD-10-CM | POA: Diagnosis not present

## 2020-12-21 DIAGNOSIS — N186 End stage renal disease: Secondary | ICD-10-CM | POA: Diagnosis not present

## 2020-12-21 DIAGNOSIS — Z992 Dependence on renal dialysis: Secondary | ICD-10-CM | POA: Diagnosis not present

## 2020-12-21 DIAGNOSIS — N2581 Secondary hyperparathyroidism of renal origin: Secondary | ICD-10-CM | POA: Diagnosis not present

## 2020-12-23 DIAGNOSIS — Z992 Dependence on renal dialysis: Secondary | ICD-10-CM | POA: Diagnosis not present

## 2020-12-23 DIAGNOSIS — N2581 Secondary hyperparathyroidism of renal origin: Secondary | ICD-10-CM | POA: Diagnosis not present

## 2020-12-23 DIAGNOSIS — N186 End stage renal disease: Secondary | ICD-10-CM | POA: Diagnosis not present

## 2020-12-24 DIAGNOSIS — N186 End stage renal disease: Secondary | ICD-10-CM | POA: Diagnosis not present

## 2020-12-24 DIAGNOSIS — Z992 Dependence on renal dialysis: Secondary | ICD-10-CM | POA: Diagnosis not present

## 2020-12-24 DIAGNOSIS — E1122 Type 2 diabetes mellitus with diabetic chronic kidney disease: Secondary | ICD-10-CM | POA: Diagnosis not present

## 2020-12-26 DIAGNOSIS — N186 End stage renal disease: Secondary | ICD-10-CM | POA: Diagnosis not present

## 2020-12-26 DIAGNOSIS — N2581 Secondary hyperparathyroidism of renal origin: Secondary | ICD-10-CM | POA: Diagnosis not present

## 2020-12-26 DIAGNOSIS — Z992 Dependence on renal dialysis: Secondary | ICD-10-CM | POA: Diagnosis not present

## 2020-12-28 DIAGNOSIS — N2581 Secondary hyperparathyroidism of renal origin: Secondary | ICD-10-CM | POA: Diagnosis not present

## 2020-12-28 DIAGNOSIS — Z992 Dependence on renal dialysis: Secondary | ICD-10-CM | POA: Diagnosis not present

## 2020-12-28 DIAGNOSIS — N186 End stage renal disease: Secondary | ICD-10-CM | POA: Diagnosis not present

## 2020-12-30 DIAGNOSIS — Z992 Dependence on renal dialysis: Secondary | ICD-10-CM | POA: Diagnosis not present

## 2020-12-30 DIAGNOSIS — N186 End stage renal disease: Secondary | ICD-10-CM | POA: Diagnosis not present

## 2020-12-30 DIAGNOSIS — N2581 Secondary hyperparathyroidism of renal origin: Secondary | ICD-10-CM | POA: Diagnosis not present

## 2021-01-02 DIAGNOSIS — N2581 Secondary hyperparathyroidism of renal origin: Secondary | ICD-10-CM | POA: Diagnosis not present

## 2021-01-02 DIAGNOSIS — Z992 Dependence on renal dialysis: Secondary | ICD-10-CM | POA: Diagnosis not present

## 2021-01-02 DIAGNOSIS — N186 End stage renal disease: Secondary | ICD-10-CM | POA: Diagnosis not present

## 2021-01-04 DIAGNOSIS — Z992 Dependence on renal dialysis: Secondary | ICD-10-CM | POA: Diagnosis not present

## 2021-01-04 DIAGNOSIS — N186 End stage renal disease: Secondary | ICD-10-CM | POA: Diagnosis not present

## 2021-01-04 DIAGNOSIS — N2581 Secondary hyperparathyroidism of renal origin: Secondary | ICD-10-CM | POA: Diagnosis not present

## 2021-01-06 DIAGNOSIS — Z992 Dependence on renal dialysis: Secondary | ICD-10-CM | POA: Diagnosis not present

## 2021-01-06 DIAGNOSIS — N2581 Secondary hyperparathyroidism of renal origin: Secondary | ICD-10-CM | POA: Diagnosis not present

## 2021-01-06 DIAGNOSIS — N186 End stage renal disease: Secondary | ICD-10-CM | POA: Diagnosis not present

## 2021-01-08 ENCOUNTER — Ambulatory Visit: Payer: Medicare HMO | Attending: Family Medicine | Admitting: Family Medicine

## 2021-01-08 ENCOUNTER — Other Ambulatory Visit: Payer: Self-pay

## 2021-01-08 ENCOUNTER — Encounter: Payer: Self-pay | Admitting: Family Medicine

## 2021-01-08 VITALS — BP 142/78 | HR 86 | Ht 71.0 in | Wt 330.0 lb

## 2021-01-08 DIAGNOSIS — E1122 Type 2 diabetes mellitus with diabetic chronic kidney disease: Secondary | ICD-10-CM | POA: Diagnosis not present

## 2021-01-08 DIAGNOSIS — I1311 Hypertensive heart and chronic kidney disease without heart failure, with stage 5 chronic kidney disease, or end stage renal disease: Secondary | ICD-10-CM

## 2021-01-08 DIAGNOSIS — N186 End stage renal disease: Secondary | ICD-10-CM | POA: Diagnosis not present

## 2021-01-08 DIAGNOSIS — Z23 Encounter for immunization: Secondary | ICD-10-CM

## 2021-01-08 DIAGNOSIS — R252 Cramp and spasm: Secondary | ICD-10-CM | POA: Diagnosis not present

## 2021-01-08 DIAGNOSIS — Z992 Dependence on renal dialysis: Secondary | ICD-10-CM | POA: Diagnosis not present

## 2021-01-08 LAB — POCT GLYCOSYLATED HEMOGLOBIN (HGB A1C): HbA1c, POC (controlled diabetic range): 6.2 % (ref 0.0–7.0)

## 2021-01-08 MED ORDER — METHOCARBAMOL 500 MG PO TABS
ORAL_TABLET | ORAL | 1 refills | Status: DC
Start: 2021-01-08 — End: 2021-10-19

## 2021-01-08 NOTE — Progress Notes (Addendum)
Subjective:  Patient ID: Adam Dennis, male    DOB: 1961-03-10  Age: 60 y.o. MRN: [id number]  CC: Diabetes   HPI Adam Dennis is a 60 y.o. year old male with a history of hypertension, ESRD on HD (on T, Th, Sat), obstructive sleep apnea (on CPAP at night), type 2 diabetes mellitus (A1c 6.0 diet controlled), combined systolic and diastolic CHF (EF 70-26% from echo of 06/2019) , obesity hypoventilation syndrome previously on 4 L of oxygen for chronic respiratory failure (no longer requires oxygen),Emphysema who presents today for an office visit.  Interval History: He had his eye exam early this year. His diabetes mellitus is diet controlled and he denies hypoglycemic episodes.  He has no numbness in his extremities. Does not exercise regularly as he is exhausted when he returns from HD sessions.  States he no longer needs Zoloft for anxiety and depression. Compliant with his antihypertensive and hemodialysis sessions are going well. He denies presence of chest pains or dyspnea. He has no additional concerns today.  Past Medical History:  Diagnosis Date   Arthritis    "back" (02/06/2017)   CHF (congestive heart failure) ([place])    new onset /Encounter Date 09/12/2006   Chronic combined systolic and diastolic heart failure (HCC)    CKD (chronic kidney disease) stage 4, GFR 15-29 ml/min (HCC)    COPD (chronic obstructive pulmonary disease) (HCC)    End stage renal disease on dialysis [facility])    Tues/ Thur/ Sat [place]   Glaucoma, right eye    High cholesterol    Hypertension    Hypertrophy of tonsils alone    Myocardial infarction ([place])    "small one; ~ 2008" (02/06/2017)   Obesity, unspecified    On home oxygen therapy    "4L; 24/7" (04/10/2017)   OSA on CPAP    Type II diabetes mellitus ([place])     Past Surgical History:  Procedure Laterality Date   AV FISTULA PLACEMENT Right 07/01/2019   Procedure: Right arm arteriovenous fistual;  Surgeon: Waynetta Sandy,  MD;  Location: [place];  Service: Vascular;  Laterality: Right;   FISTULA SUPERFICIALIZATION Right 09/01/2019   Procedure: RIGHT UPPER ARM FISTULA  TRANSPOSITION OF RIGHT UPPPER ARM BRACHIAL CEPHALIC FISTULA;  Surgeon: Waynetta Sandy, MD;  Location: [place];  Service: Vascular;  Laterality: Right;   HERNIA REPAIR     INSERTION OF DIALYSIS CATHETER Right 07/01/2019   Procedure: INSERTION OF TUNNELED DIALYSIS CATHETER;  Surgeon: Waynetta Sandy, MD;  Location: [place];  Service: Vascular;  Laterality: Right;   TEE WITHOUT CARDIOVERSION N/A 04/16/2017   Procedure: TRANSESOPHAGEAL ECHOCARDIOGRAM (TEE);  Surgeon: [name], MD;  Location: [facility] ENDOSCOPY;  Service: Cardiovascular;  Laterality: N/A;   UMBILICAL HERNIA REPAIR  1960s   "when I was little baby"    Family History  Problem Relation Age of Onset   Hypertension Mother    Diabetes Brother    Diabetes Sister    Cancer Maternal Aunt    Amblyopia Neg Hx    Blindness Neg Hx    Cataracts Neg Hx    Glaucoma Neg Hx    Macular degeneration Neg Hx    Strabismus Neg Hx    Retinal detachment Neg Hx    Retinitis pigmentosa Neg Hx     No Known Allergies  Outpatient Medications Prior to Visit  Medication Sig Dispense Refill   ACCU-CHEK SOFTCLIX LANCETS lancets Use as instructed to check blood sugar up to 3  times daily. 100 each 11   acetaminophen (TYLENOL) 500 MG tablet Take 500 mg by mouth every 6 (six) hours as needed for moderate pain or headache.     albuterol (VENTOLIN HFA) 108 (90 Base) MCG/ACT inhaler INHALE 2 PUFFS INTO THE LUNGS EVERY 6 HOURS AS NEEDED FOR WHEEZING OR SHORTNESS OF BREATH 54 g 0   amLODipine (NORVASC) 5 MG tablet TAKE 1 TABLET(5 MG) BY MOUTH DAILY 90 tablet 0   aspirin EC 81 MG tablet Take 81 mg by mouth daily.     atorvastatin (LIPITOR) 40 MG tablet TAKE 1 TABLET(40 MG) BY MOUTH DAILY 90 tablet 1   Blood Glucose Monitoring Suppl (ACCU-CHEK AVIVA) device Use as instructed to check blood sugar up to 3  times daily. 1 each 0   calcium carbonate (TUMS - DOSED IN MG ELEMENTAL CALCIUM) 500 MG chewable tablet Chew 1 tablet (200 mg of elemental calcium total) by mouth 2 (two) times daily. (Patient taking differently: Chew 500 mg by mouth 2 (two) times daily.)     Fluticasone-Salmeterol (ADVAIR DISKUS) 250-50 MCG/DOSE AEPB Inhale 1 puff into the lungs 2 (two) times daily. (Patient taking differently: Inhale 1 puff into the lungs daily.) 1 each 6   glucose blood (ACCU-CHEK AVIVA PLUS) test strip Use as instructed to check blood sugar up to 3 times daily. 100 each 11   Lancet Devices (ACCU-CHEK SOFTCLIX) lancets Use as instructed daily. 1 each 5   Multiple Vitamin (MULTIVITAMIN ADULT) TABS Take 1 tablet by mouth daily. (Patient taking differently: Take 1 tablet by mouth at bedtime.) 30 tablet 3   nitroGLYCERIN (NITROSTAT) 0.4 MG SL tablet PLACE 1 TABLET BY MOUTH UNDER TONGUE EVERY 5 MINUTES FOR 3 DOSES AS NEEDED FOR CHEST PAIN (Patient taking differently: Place 0.4 mg under the tongue every 5 (five) minutes as needed for chest pain.) 25 tablet 0   timolol (TIMOPTIC-XR) 0.5 % ophthalmic gel-forming Place 1 drop into both eyes daily.      TRAVATAN Z 0.004 % SOLN ophthalmic solution Place 1 drop into both eyes at bedtime.  12   methocarbamol (ROBAXIN) 500 MG tablet TAKE 1 TABLET(500 MG) BY MOUTH TWICE DAILY AS NEEDED FOR MUSCLE SPASMS 60 tablet 1   cholecalciferol (VITAMIN D3) 25 MCG (1000 UNIT) tablet Take 1,000 Units by mouth daily. (Patient not taking: Reported on 01/08/2021)     FEROSUL 325 (65 Fe) MG tablet TAKE 1 TABLET(325 MG) BY MOUTH DAILY WITH BREAKFAST (Patient not taking: Reported on 01/08/2021) 100 tablet 3   lidocaine-prilocaine (EMLA) cream Apply 1 application topically as needed (topical anesthesia for hemodialysis if Gebauers and Lidocaine injection are ineffective.). (Patient not taking: Reported on 01/08/2021) 30 g 0   oxyCODONE-acetaminophen (PERCOCET) 5-325 MG tablet Take 1 tablet by mouth every  6 (six) hours as needed. (Patient not taking: No sig reported) 15 tablet 0   sertraline (ZOLOFT) 25 MG tablet Take 1 tablet (25 mg total) by mouth daily. 90 tablet 1   No facility-administered medications prior to visit.     ROS Review of Systems  Constitutional:  Negative for activity change and appetite change.  HENT:  Negative for sinus pressure and sore throat.   Eyes:  Negative for visual disturbance.  Respiratory:  Negative for cough, chest tightness and shortness of breath.   Cardiovascular:  Negative for chest pain and leg swelling.  Gastrointestinal:  Negative for abdominal distention, abdominal pain, constipation and diarrhea.  Endocrine: Negative.   Genitourinary:  Negative for dysuria.  Musculoskeletal:  Negative for joint swelling and myalgias.  Skin:  Negative for rash.  Allergic/Immunologic: Negative.   Neurological:  Negative for weakness, light-headedness and numbness.  Psychiatric/Behavioral:  Negative for dysphoric mood and suicidal ideas.    Objective:  BP (!) 142/78   Pulse 86   Ht 5\' 11"  (1.803 m)   Wt (!) 330 lb (149.7 kg)   SpO2 97%   BMI 46.03 kg/m   BP/Weight 01/08/2021 05/24/2020 7/37/1062  Systolic BP 694 854 627  Diastolic BP 78 73 75  Wt. (Lbs) 330 318.6 315  BMI 46.03 44.44 43.93      Physical Exam Constitutional:      Appearance: He is well-developed. He is obese.  Cardiovascular:     Rate and Rhythm: Normal rate.     Heart sounds: Normal heart sounds. No murmur heard. Pulmonary:     Effort: Pulmonary effort is normal.     Breath sounds: Normal breath sounds. No wheezing or rales.  Chest:     Chest wall: No tenderness.  Abdominal:     General: Bowel sounds are normal. There is no distension.     Palpations: Abdomen is soft. There is no mass.     Tenderness: There is no abdominal tenderness.  Musculoskeletal:        General: Normal range of motion.     Right lower leg: No edema.     Left lower leg: No edema.     Comments: R arm  AV fistula with palpable thrill  Neurological:     Mental Status: He is alert and oriented to person, place, and time.  Psychiatric:        Mood and Affect: Mood normal.    CMP Latest Ref Rng & Units 09/01/2019 07/06/2019 07/04/2019  Glucose 70 - 99 mg/dL 91 104(H) 96  BUN 6 - 20 mg/dL 30(H) 57(H) 35(H)  Creatinine 0.61 - 1.24 mg/dL 6.20(H) 7.58(H) 5.52(H)  Sodium 135 - 145 mmol/L 137 136 136  Potassium 3.5 - 5.1 mmol/L 4.4 3.7 4.2  Chloride 98 - 111 mmol/L 96(L) 94(L) 95(L)  CO2 22 - 32 mmol/L - 28 26  Calcium 8.9 - 10.3 mg/dL - 8.6(L) 8.5(L)  Total Protein 6.5 - 8.1 g/dL - - -  Total Bilirubin 0.3 - 1.2 mg/dL - - -  Alkaline Phos 38 - 126 U/L - - -  AST 15 - 41 U/L - - -  ALT 0 - 44 U/L - - -    Lipid Panel     Component Value Date/Time   CHOL 160 05/24/2020 0926   TRIG 79 05/24/2020 0926   HDL 63 05/24/2020 0926   CHOLHDL 2.5 05/24/2020 0926   CHOLHDL 2.8 10/07/2014 1547   VLDL 13 10/07/2014 1547   LDLCALC 82 05/24/2020 0926    CBC    Component Value Date/Time   WBC 8.9 07/06/2019 0754   RBC 4.81 07/06/2019 0754   HGB 13.6 09/01/2019 0826   HGB 12.8 (L) 03/24/2019 1017   HCT 40.0 09/01/2019 0826   HCT 39.7 03/24/2019 1017   PLT 186 07/06/2019 0754   PLT 191 03/24/2019 1017   MCV 91.3 07/06/2019 0754   MCV 87 03/24/2019 1017   MCH 28.3 07/06/2019 0754   MCHC 31.0 07/06/2019 0754   RDW 16.5 (H) 07/06/2019 0754   RDW 13.9 03/24/2019 1017   LYMPHSABS 1.3 03/24/2019 1017   MONOABS 0.8 04/28/2017 1236   EOSABS 0.0 03/24/2019 1017   BASOSABS 0.0 03/24/2019 1017    Lab  Results  Component Value Date   HGBA1C 6.2 01/08/2021    Assessment & Plan:  1. Type 2 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, without long-term current use of insulin (HCC) Diet controlled with A1c of 6.2 Continue working on diet control and lifestyle modifications. Continue hemodialysis sessions as per schedule - POCT glycosylated hemoglobin (Hb A1C)  2. Muscle  cramp He takes this intermittently for muscle spasms - methocarbamol (ROBAXIN) 500 MG tablet; TAKE 1 TABLET(500 MG) BY MOUTH TWICE DAILY AS NEEDED FOR MUSCLE SPASMS  Dispense: 60 tablet; Refill: 1  3. 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 Discussed medication compliance, adverse effects.  4. Morbid obesity ([place]) He has gained 12 pounds in the last 8 months Advised on caloric restrictions and increasing physical activity  Second dose of shingles vaccine at next visit Meds ordered this encounter  Medications   methocarbamol (ROBAXIN) 500 MG tablet    Sig: TAKE 1 TABLET(500 MG) BY MOUTH TWICE DAILY AS NEEDED FOR MUSCLE SPASMS    Dispense:  60 tablet    Refill:  1     Follow-up: Return in about 6 weeks (around 02/19/2021) for Medicare Wellness exam.       Charlott Rakes, MD, FAAFP. [facility] [place], [place]   01/08/2021, 1:07 PM

## 2021-01-08 NOTE — Patient Instructions (Signed)

## 2021-01-09 DIAGNOSIS — N2581 Secondary hyperparathyroidism of renal origin: Secondary | ICD-10-CM | POA: Diagnosis not present

## 2021-01-09 DIAGNOSIS — N186 End stage renal disease: Secondary | ICD-10-CM | POA: Diagnosis not present

## 2021-01-09 DIAGNOSIS — Z992 Dependence on renal dialysis: Secondary | ICD-10-CM | POA: Diagnosis not present

## 2021-01-11 DIAGNOSIS — N2581 Secondary hyperparathyroidism of renal origin: Secondary | ICD-10-CM | POA: Diagnosis not present

## 2021-01-11 DIAGNOSIS — N186 End stage renal disease: Secondary | ICD-10-CM | POA: Diagnosis not present

## 2021-01-11 DIAGNOSIS — Z992 Dependence on renal dialysis: Secondary | ICD-10-CM | POA: Diagnosis not present

## 2021-01-13 DIAGNOSIS — N186 End stage renal disease: Secondary | ICD-10-CM | POA: Diagnosis not present

## 2021-01-13 DIAGNOSIS — Z992 Dependence on renal dialysis: Secondary | ICD-10-CM | POA: Diagnosis not present

## 2021-01-13 DIAGNOSIS — N2581 Secondary hyperparathyroidism of renal origin: Secondary | ICD-10-CM | POA: Diagnosis not present

## 2021-01-16 DIAGNOSIS — N186 End stage renal disease: Secondary | ICD-10-CM | POA: Diagnosis not present

## 2021-01-16 DIAGNOSIS — N2581 Secondary hyperparathyroidism of renal origin: Secondary | ICD-10-CM | POA: Diagnosis not present

## 2021-01-16 DIAGNOSIS — Z992 Dependence on renal dialysis: Secondary | ICD-10-CM | POA: Diagnosis not present

## 2021-01-18 DIAGNOSIS — N186 End stage renal disease: Secondary | ICD-10-CM | POA: Diagnosis not present

## 2021-01-18 DIAGNOSIS — Z992 Dependence on renal dialysis: Secondary | ICD-10-CM | POA: Diagnosis not present

## 2021-01-18 DIAGNOSIS — N2581 Secondary hyperparathyroidism of renal origin: Secondary | ICD-10-CM | POA: Diagnosis not present

## 2021-01-20 DIAGNOSIS — Z992 Dependence on renal dialysis: Secondary | ICD-10-CM | POA: Diagnosis not present

## 2021-01-20 DIAGNOSIS — N186 End stage renal disease: Secondary | ICD-10-CM | POA: Diagnosis not present

## 2021-01-20 DIAGNOSIS — N2581 Secondary hyperparathyroidism of renal origin: Secondary | ICD-10-CM | POA: Diagnosis not present

## 2021-01-23 DIAGNOSIS — N186 End stage renal disease: Secondary | ICD-10-CM | POA: Diagnosis not present

## 2021-01-23 DIAGNOSIS — Z992 Dependence on renal dialysis: Secondary | ICD-10-CM | POA: Diagnosis not present

## 2021-01-23 DIAGNOSIS — N2581 Secondary hyperparathyroidism of renal origin: Secondary | ICD-10-CM | POA: Diagnosis not present

## 2021-01-24 DIAGNOSIS — N186 End stage renal disease: Secondary | ICD-10-CM | POA: Diagnosis not present

## 2021-01-24 DIAGNOSIS — E1122 Type 2 diabetes mellitus with diabetic chronic kidney disease: Secondary | ICD-10-CM | POA: Diagnosis not present

## 2021-01-24 DIAGNOSIS — Z992 Dependence on renal dialysis: Secondary | ICD-10-CM | POA: Diagnosis not present

## 2021-01-25 DIAGNOSIS — N2581 Secondary hyperparathyroidism of renal origin: Secondary | ICD-10-CM | POA: Diagnosis not present

## 2021-01-25 DIAGNOSIS — N186 End stage renal disease: Secondary | ICD-10-CM | POA: Diagnosis not present

## 2021-01-25 DIAGNOSIS — Z992 Dependence on renal dialysis: Secondary | ICD-10-CM | POA: Diagnosis not present

## 2021-01-27 ENCOUNTER — Telehealth: Payer: Self-pay

## 2021-01-27 DIAGNOSIS — Z992 Dependence on renal dialysis: Secondary | ICD-10-CM | POA: Diagnosis not present

## 2021-01-27 DIAGNOSIS — N2581 Secondary hyperparathyroidism of renal origin: Secondary | ICD-10-CM | POA: Diagnosis not present

## 2021-01-27 DIAGNOSIS — N186 End stage renal disease: Secondary | ICD-10-CM | POA: Diagnosis not present

## 2021-01-27 NOTE — Telephone Encounter (Signed)
LVM for pt to call office to schedule AWV.  Please schedule on Adam Dennis schedule.  60 min appt 

## 2021-01-30 DIAGNOSIS — N2581 Secondary hyperparathyroidism of renal origin: Secondary | ICD-10-CM | POA: Diagnosis not present

## 2021-01-30 DIAGNOSIS — Z992 Dependence on renal dialysis: Secondary | ICD-10-CM | POA: Diagnosis not present

## 2021-01-30 DIAGNOSIS — N186 End stage renal disease: Secondary | ICD-10-CM | POA: Diagnosis not present

## 2021-02-01 DIAGNOSIS — N2581 Secondary hyperparathyroidism of renal origin: Secondary | ICD-10-CM | POA: Diagnosis not present

## 2021-02-01 DIAGNOSIS — N186 End stage renal disease: Secondary | ICD-10-CM | POA: Diagnosis not present

## 2021-02-01 DIAGNOSIS — Z992 Dependence on renal dialysis: Secondary | ICD-10-CM | POA: Diagnosis not present

## 2021-02-03 DIAGNOSIS — N186 End stage renal disease: Secondary | ICD-10-CM | POA: Diagnosis not present

## 2021-02-03 DIAGNOSIS — Z992 Dependence on renal dialysis: Secondary | ICD-10-CM | POA: Diagnosis not present

## 2021-02-03 DIAGNOSIS — N2581 Secondary hyperparathyroidism of renal origin: Secondary | ICD-10-CM | POA: Diagnosis not present

## 2021-02-06 DIAGNOSIS — Z992 Dependence on renal dialysis: Secondary | ICD-10-CM | POA: Diagnosis not present

## 2021-02-06 DIAGNOSIS — N2581 Secondary hyperparathyroidism of renal origin: Secondary | ICD-10-CM | POA: Diagnosis not present

## 2021-02-06 DIAGNOSIS — N186 End stage renal disease: Secondary | ICD-10-CM | POA: Diagnosis not present

## 2021-02-08 DIAGNOSIS — N186 End stage renal disease: Secondary | ICD-10-CM | POA: Diagnosis not present

## 2021-02-08 DIAGNOSIS — Z992 Dependence on renal dialysis: Secondary | ICD-10-CM | POA: Diagnosis not present

## 2021-02-08 DIAGNOSIS — N2581 Secondary hyperparathyroidism of renal origin: Secondary | ICD-10-CM | POA: Diagnosis not present

## 2021-02-10 DIAGNOSIS — Z992 Dependence on renal dialysis: Secondary | ICD-10-CM | POA: Diagnosis not present

## 2021-02-10 DIAGNOSIS — N2581 Secondary hyperparathyroidism of renal origin: Secondary | ICD-10-CM | POA: Diagnosis not present

## 2021-02-10 DIAGNOSIS — N186 End stage renal disease: Secondary | ICD-10-CM | POA: Diagnosis not present

## 2021-02-13 DIAGNOSIS — N2581 Secondary hyperparathyroidism of renal origin: Secondary | ICD-10-CM | POA: Diagnosis not present

## 2021-02-13 DIAGNOSIS — Z992 Dependence on renal dialysis: Secondary | ICD-10-CM | POA: Diagnosis not present

## 2021-02-13 DIAGNOSIS — N186 End stage renal disease: Secondary | ICD-10-CM | POA: Diagnosis not present

## 2021-02-15 DIAGNOSIS — Z992 Dependence on renal dialysis: Secondary | ICD-10-CM | POA: Diagnosis not present

## 2021-02-15 DIAGNOSIS — N2581 Secondary hyperparathyroidism of renal origin: Secondary | ICD-10-CM | POA: Diagnosis not present

## 2021-02-15 DIAGNOSIS — N186 End stage renal disease: Secondary | ICD-10-CM | POA: Diagnosis not present

## 2021-02-17 DIAGNOSIS — Z992 Dependence on renal dialysis: Secondary | ICD-10-CM | POA: Diagnosis not present

## 2021-02-17 DIAGNOSIS — N186 End stage renal disease: Secondary | ICD-10-CM | POA: Diagnosis not present

## 2021-02-17 DIAGNOSIS — N2581 Secondary hyperparathyroidism of renal origin: Secondary | ICD-10-CM | POA: Diagnosis not present

## 2021-02-19 ENCOUNTER — Ambulatory Visit: Payer: Medicare HMO | Attending: Family Medicine | Admitting: Family Medicine

## 2021-02-19 ENCOUNTER — Other Ambulatory Visit: Payer: Self-pay

## 2021-02-19 ENCOUNTER — Encounter: Payer: Self-pay | Admitting: Family Medicine

## 2021-02-19 VITALS — BP 135/73 | HR 78 | Ht 71.0 in | Wt 327.0 lb

## 2021-02-19 DIAGNOSIS — Z0000 Encounter for general adult medical examination without abnormal findings: Secondary | ICD-10-CM | POA: Diagnosis not present

## 2021-02-19 NOTE — Progress Notes (Signed)
Subjective:   Adam Dennis is a 60 y.o. male who presents for Medicare Annual/Subsequent preventive examination.  Review of Systems    General: negative for fever, weight loss, appetite change Eyes: no visual symptoms. ENT: no ear symptoms, no sinus tenderness, no nasal congestion or sore throat. Neck: no pain  Respiratory: no wheezing, shortness of breath, cough Cardiovascular: no chest pain, no dyspnea on exertion, no pedal edema, no orthopnea. Gastrointestinal: no abdominal pain, no diarrhea, no constipation Genito-Urinary: no urinary frequency, no dysuria, no polyuria. Hematologic: no bruising Endocrine: no cold or heat intolerance Neurological: no headaches, no seizures, no tremors Musculoskeletal: no joint pains, no joint swelling Skin: no pruritus, no rash. Psychological: no depression, no anxiety,         Objective:    Today's Vitals   02/19/21 0941  BP: 135/73  Pulse: 78  SpO2: 94%  Weight: (!) 327 lb (148.3 kg)  Height: 5\' 11"  (1.803 m)   Body mass index is 45.61 kg/m.  Advanced Directives 02/19/2021 09/01/2019 06/30/2019 06/30/2019 06/15/2019 03/04/2018 07/01/2017  Does Patient Have a Medical Advance Directive? No No - No No No No  Would patient like information on creating a medical advance directive? - No - Patient declined No - Patient declined No - Patient declined No - Patient declined No - Patient declined No - Patient declined  Pre-existing out of facility DNR order (yellow form or pink MOST form) - - - - - - -    Current Medications (verified) Outpatient Encounter Medications as of 02/19/2021  Medication Sig   ACCU-CHEK SOFTCLIX LANCETS lancets Use as instructed to check blood sugar up to 3 times daily.   acetaminophen (TYLENOL) 500 MG tablet Take 500 mg by mouth every 6 (six) hours as needed for moderate pain or headache.   albuterol (VENTOLIN HFA) 108 (90 Base) MCG/ACT inhaler INHALE 2 PUFFS INTO THE LUNGS EVERY 6 HOURS AS NEEDED FOR WHEEZING OR SHORTNESS  OF BREATH   amLODipine (NORVASC) 5 MG tablet TAKE 1 TABLET(5 MG) BY MOUTH DAILY   aspirin EC 81 MG tablet Take 81 mg by mouth daily.   atorvastatin (LIPITOR) 40 MG tablet TAKE 1 TABLET(40 MG) BY MOUTH DAILY   Blood Glucose Monitoring Suppl (ACCU-CHEK AVIVA) device Use as instructed to check blood sugar up to 3 times daily.   calcium carbonate (TUMS - DOSED IN MG ELEMENTAL CALCIUM) 500 MG chewable tablet Chew 1 tablet (200 mg of elemental calcium total) by mouth 2 (two) times daily. (Patient taking differently: Chew 500 mg by mouth 2 (two) times daily.)   cholecalciferol (VITAMIN D3) 25 MCG (1000 UNIT) tablet Take 1,000 Units by mouth daily.   FEROSUL 325 (65 Fe) MG tablet TAKE 1 TABLET(325 MG) BY MOUTH DAILY WITH BREAKFAST   Fluticasone-Salmeterol (ADVAIR DISKUS) 250-50 MCG/DOSE AEPB Inhale 1 puff into the lungs 2 (two) times daily. (Patient taking differently: Inhale 1 puff into the lungs daily.)   glucose blood (ACCU-CHEK AVIVA PLUS) test strip Use as instructed to check blood sugar up to 3 times daily.   Lancet Devices (ACCU-CHEK SOFTCLIX) lancets Use as instructed daily.   lidocaine-prilocaine (EMLA) cream Apply 1 application topically as needed (topical anesthesia for hemodialysis if Gebauers and Lidocaine injection are ineffective.).   methocarbamol (ROBAXIN) 500 MG tablet TAKE 1 TABLET(500 MG) BY MOUTH TWICE DAILY AS NEEDED FOR MUSCLE SPASMS   Multiple Vitamin (MULTIVITAMIN ADULT) TABS Take 1 tablet by mouth daily. (Patient taking differently: Take 1 tablet by mouth at bedtime.)  nitroGLYCERIN (NITROSTAT) 0.4 MG SL tablet PLACE 1 TABLET BY MOUTH UNDER TONGUE EVERY 5 MINUTES FOR 3 DOSES AS NEEDED FOR CHEST PAIN (Patient taking differently: Place 0.4 mg under the tongue every 5 (five) minutes as needed for chest pain.)   oxyCODONE-acetaminophen (PERCOCET) 5-325 MG tablet Take 1 tablet by mouth every 6 (six) hours as needed.   timolol (TIMOPTIC-XR) 0.5 % ophthalmic gel-forming Place 1 drop into  both eyes daily.    TRAVATAN Z 0.004 % SOLN ophthalmic solution Place 1 drop into both eyes at bedtime.   No facility-administered encounter medications on file as of 02/19/2021.    Allergies (verified) Patient has no known allergies.   History: Past Medical History:  Diagnosis Date   Arthritis    "back" (02/06/2017)   CHF (congestive heart failure) ([place])    new onset /Encounter Date 09/12/2006   Chronic combined systolic and diastolic heart failure (HCC)    CKD (chronic kidney disease) stage 4, GFR 15-29 ml/min (HCC)    COPD (chronic obstructive pulmonary disease) (HCC)    End stage renal disease on dialysis [facility])    Tues/ Thur/ Sat [place]   Glaucoma, right eye    High cholesterol    Hypertension    Hypertrophy of tonsils alone    Myocardial infarction ([place])    "small one; ~ 2008" (02/06/2017)   Obesity, unspecified    On home oxygen therapy    "4L; 24/7" (04/10/2017)   OSA on CPAP    Type II diabetes mellitus ([place])    Past Surgical History:  Procedure Laterality Date   AV FISTULA PLACEMENT Right 07/01/2019   Procedure: Right arm arteriovenous fistual;  Surgeon: Waynetta Sandy, MD;  Location: [place];  Service: Vascular;  Laterality: Right;   FISTULA SUPERFICIALIZATION Right 09/01/2019   Procedure: RIGHT UPPER ARM FISTULA  TRANSPOSITION OF RIGHT UPPPER ARM BRACHIAL CEPHALIC FISTULA;  Surgeon: Waynetta Sandy, MD;  Location: [place];  Service: Vascular;  Laterality: Right;   HERNIA REPAIR     INSERTION OF DIALYSIS CATHETER Right 07/01/2019   Procedure: INSERTION OF TUNNELED DIALYSIS CATHETER;  Surgeon: Waynetta Sandy, MD;  Location: [place];  Service: Vascular;  Laterality: Right;   TEE WITHOUT CARDIOVERSION N/A 04/16/2017   Procedure: TRANSESOPHAGEAL ECHOCARDIOGRAM (TEE);  Surgeon: [name], MD;  Location[facility] END[facility]COPY;  Service: Cardiovascular;  Laterality: N/A;   UMBILICAL HERNIA REPAIR  1960s   "when I was little baby"   Family  History  Problem Relation Age of Onset   Hypertension Mother    Diabetes Brother    Diabetes Sister    Cancer Maternal Aunt    Amblyopia Neg Hx    Blindness Neg Hx    Cataracts Neg Hx    Glaucoma Neg Hx    Macular degeneration Neg Hx    Strabismus Neg Hx    Retinal detachment Neg Hx    Retinitis pigmentosa Neg Hx    Social History   Socioeconomic History   Marital status: Single    Spouse name: Not on file   Number of children: 0   Years of education: Not on file   Highest education level: Not on file  Occupational History   Occupation: works as a Training and development officer  Tobacco Use   Smoking status: Never   Smokeless tobacco: Never  Scientific laboratory technician Use: Never used  Substance and Sexual Activity   Alcohol use: No   Drug use: No   Sexual activity: Not Currently  Other Topics Concern   Not on file  Social History Narrative   Not on file   Social Determinants of Health   Financial Resource Strain: Not on file  Food Insecurity: Not on file  Transportation Needs: Not on file  Physical Activity: Not on file  Stress: Not on file  Social Connections: Not on file    Tobacco Counseling Counseling given: Not Answered   Clinical Intake:  Pre-visit preparation completed: No  Pain : No/denies pain     Diabetes: Yes CBG done?: No     Diabetic?Yes  Interpreter Needed?: No      Activities of Daily Living In your present state of health, do you have any difficulty performing the following activities: 02/19/2021  Hearing? N  Vision? N  Difficulty concentrating or making decisions? Y  Walking or climbing stairs? N  Dressing or bathing? N  Doing errands, shopping? N  Preparing Food and eating ? N  Using the Toilet? N  In the past six months, have you accidently leaked urine? N  Do you have problems with loss of bowel control? N  Managing your Medications? N  Managing your Finances? N  Housekeeping or managing your Housekeeping? N  Some recent data might be hidden     Patient Care Team: Charlott Rakes, MD as PCP - General (Family Medicine)  Indicate any recent Medical Services you may have received from other than [facility] providers in the past year (date may be approximate).     Assessment:   This is a routine wellness examination for Sevrin.  Hearing/Vision screen Vision Screening   Right eye Left eye Both eyes  Without correction 20/25 20/20   With correction       Dietary issues and exercise activities discussed:     Goals Addressed   None    Depression Screen PHQ 2/9 Scores 02/19/2021 01/08/2021 05/24/2020 11/24/2019 03/24/2019 09/17/2018 06/30/2018  PHQ - 2 Score 0 1 0 2 0 0 0  PHQ- 9 Score - 2 3 6 1  - 3    Fall Risk Fall Risk  02/19/2021 01/08/2021 11/24/2019 08/11/2019 07/22/2019  Falls in the past year? 0 0 0 0 0  Number falls in past yr: 0 0 - - -  Injury with Fall? 0 0 - - -  Risk for fall due to : - - No Fall Risks - -  Follow up - - - - -    [place]:  Any stairs in or around the home? No  If so, are there any without handrails? No  Home free of loose throw rugs in walkways, pet beds, electrical cords, etc? Yes  Adequate lighting in your home to reduce risk of falls? Yes   ASSISTIVE DEVICES UTILIZED TO PREVENT FALLS:  Life alert? No  Use of a cane, walker or w/c? No  Grab bars in the bathroom? Yes  Shower chair or bench in shower? Yes  Elevated toilet seat or a handicapped toilet? No   TIMED UP AND GO:  Was the test performed? Yes .  Length of time to ambulate 10 feet: 11 sec.   Gait slow and steady without use of assistive device  Cognitive Function:        Immunizations Immunization History  Administered Date(s) Administered   Influenza Whole 06/02/2009   Influenza,inj,Quad PF,6+ Mos 02/15/2013, 02/10/2015, 02/07/2017   Pneumococcal Polysaccharide-23 12/13/2012   Tdap 06/26/2015   Zoster Recombinat (Shingrix) 01/08/2021    Screening Tests Health  Maintenance   Topic Date Due   OPHTHALMOLOGY EXAM  08/03/2020   COVID-19 Vaccine (4 - Booster for Moderna series) 08/09/2020   INFLUENZA VACCINE  12/25/2020   Zoster Vaccines- Shingrix (2 of 2) 03/05/2021   HEMOGLOBIN A1C  07/11/2021   FOOT EXAM  01/08/2022   TETANUS/TDAP  06/25/2025   COLONOSCOPY (Pts 45-76yrs Insurance coverage will need to be confirmed)  11/06/2027   Hepatitis C Screening  Completed   HIV Screening  Completed   HPV VACCINES  Aged Out   URINE MICROALBUMIN  Discontinued    Health Maintenance  Health Maintenance Due  Topic Date Due   OPHTHALMOLOGY EXAM  08/03/2020   COVID-19 Vaccine (4 - Booster for Moderna series) 08/09/2020   INFLUENZA VACCINE  12/25/2020    Colorectal cancer screening: Type of screening: Colonoscopy. Completed 2019. Repeat every 10 years  Lung Cancer Screening: (Low Dose CT Chest recommended if Age 65-80 years, 30 pack-year currently smoking OR have quit w/in 15years.) does not qualify.   Lung Cancer Screening Referral: n/a  Additional Screening:  Hepatitis C Screening: does qualify; Completed yes  Vision Screening: Recommended annual ophthalmology exams for early detection of glaucoma and other disorders of the eye. Is the patient up to date with their annual eye exam?  Yes  Who is the provider or what is the name of the office in which the patient attends annual eye exams? Unknown If pt is not established with a provider, would they like to be referred to a provider to establish care?  N/a .   Dental Screening: Recommended annual dental exams for proper oral hygiene  Community Resource Referral / Chronic Care Management: CRR required this visit?  No   CCM required this visit?  No   Constitutional: Obese appearing,  Eyes: PERRLA HEENT: Head is atraumatic, normal sinuses, normal oropharynx, normal appearing tonsils and palate, tympanic membrane is normal bilaterally. Neck: normal range of motion, no thyromegaly, no JVD Cardiovascular: normal  rate and rhythm, normal heart sounds, no murmurs, rub or gallop, no pedal edema Respiratory: Normal breath sounds, clear to auscultation bilaterally, no wheezes, no rales, no rhonchi Abdomen: soft, not tender to palpation, normal bowel sounds, no enlarged organs Musculoskeletal: Full ROM, no tenderness in joints Skin: warm and dry, R AV fistula with palpable thrill Neurological: alert, oriented x3, cranial nerves I-XII grossly intact , normal motor strength, normal sensation. Psychological: normal mood.    Plan:    1. Encounter for Medicare annual wellness exam Counseled on 150 minutes of exercise per week, healthy eating (including decreased daily intake of saturated fats, cholesterol, added sugars, sodium), routine healthcare maintenance. Discussed the goal of working on weight loss as he also needs to lose weight to be placed on the kidney transplant list at [facility].  I have personally reviewed and noted the following in the patient's chart:   Medical and social history Use of alcohol, tobacco or illicit drugs  Current medications and supplements including opioid prescriptions. Patient is not currently taking opioid prescriptions. Functional ability and status Nutritional status Physical activity Advanced directives List of other physicians Hospitalizations, surgeries, and ER visits in previous 12 months Vitals Screenings to include cognitive, depression, and falls Referrals and appointments  In addition, I have reviewed and discussed with patient certain preventive protocols, quality metrics, and best practice recommendations. A written personalized care plan for preventive services as well as general preventive health recommendations were provided to patient.     Charlott Rakes, MD  02/19/2021       

## 2021-02-19 NOTE — Patient Instructions (Signed)
  Adam Dennis , Thank you for taking time to come for your Medicare Wellness Visit. I appreciate your ongoing commitment to your health goals. Please review the following plan we discussed and let me know if I can assist you in the future.   These are the goals we discussed:  Goals      Blood Pressure < 140/90     Exercise 150 minutes per week (moderate activity)     Reduce salt intake to 2 grams per day or less        This is a list of the screening recommended for you and due dates:  Health Maintenance  Topic Date Due   COVID-19 Vaccine (4 - Booster for Moderna series) 08/09/2020   Flu Shot  12/25/2020   Zoster (Shingles) Vaccine (2 of 2) 03/05/2021   Hemoglobin A1C  07/11/2021   Eye exam for diabetics  11/06/2021   Complete foot exam   01/08/2022   Tetanus Vaccine  06/25/2025   Colon Cancer Screening  11/06/2027   Hepatitis C Screening: USPSTF Recommendation to screen - Ages 18-79 yo.  Completed   HIV Screening  Completed   HPV Vaccine  Aged Out   Urine Protein Check  Discontinued

## 2021-02-20 DIAGNOSIS — N186 End stage renal disease: Secondary | ICD-10-CM | POA: Diagnosis not present

## 2021-02-20 DIAGNOSIS — Z992 Dependence on renal dialysis: Secondary | ICD-10-CM | POA: Diagnosis not present

## 2021-02-20 DIAGNOSIS — N2581 Secondary hyperparathyroidism of renal origin: Secondary | ICD-10-CM | POA: Diagnosis not present

## 2021-02-22 ENCOUNTER — Other Ambulatory Visit: Payer: Self-pay | Admitting: Family Medicine

## 2021-02-22 DIAGNOSIS — N186 End stage renal disease: Secondary | ICD-10-CM | POA: Diagnosis not present

## 2021-02-22 DIAGNOSIS — N2581 Secondary hyperparathyroidism of renal origin: Secondary | ICD-10-CM | POA: Diagnosis not present

## 2021-02-22 DIAGNOSIS — Z992 Dependence on renal dialysis: Secondary | ICD-10-CM | POA: Diagnosis not present

## 2021-02-22 DIAGNOSIS — I10 Essential (primary) hypertension: Secondary | ICD-10-CM

## 2021-02-23 DIAGNOSIS — E1122 Type 2 diabetes mellitus with diabetic chronic kidney disease: Secondary | ICD-10-CM | POA: Diagnosis not present

## 2021-02-23 DIAGNOSIS — Z992 Dependence on renal dialysis: Secondary | ICD-10-CM | POA: Diagnosis not present

## 2021-02-23 DIAGNOSIS — N186 End stage renal disease: Secondary | ICD-10-CM | POA: Diagnosis not present

## 2021-02-24 DIAGNOSIS — N2581 Secondary hyperparathyroidism of renal origin: Secondary | ICD-10-CM | POA: Diagnosis not present

## 2021-02-24 DIAGNOSIS — Z992 Dependence on renal dialysis: Secondary | ICD-10-CM | POA: Diagnosis not present

## 2021-02-24 DIAGNOSIS — N186 End stage renal disease: Secondary | ICD-10-CM | POA: Diagnosis not present

## 2021-02-27 DIAGNOSIS — N186 End stage renal disease: Secondary | ICD-10-CM | POA: Diagnosis not present

## 2021-02-27 DIAGNOSIS — Z992 Dependence on renal dialysis: Secondary | ICD-10-CM | POA: Diagnosis not present

## 2021-02-27 DIAGNOSIS — N2581 Secondary hyperparathyroidism of renal origin: Secondary | ICD-10-CM | POA: Diagnosis not present

## 2021-02-28 DIAGNOSIS — I10 Essential (primary) hypertension: Secondary | ICD-10-CM | POA: Diagnosis not present

## 2021-02-28 DIAGNOSIS — E785 Hyperlipidemia, unspecified: Secondary | ICD-10-CM | POA: Diagnosis not present

## 2021-02-28 DIAGNOSIS — I5042 Chronic combined systolic (congestive) and diastolic (congestive) heart failure: Secondary | ICD-10-CM | POA: Diagnosis not present

## 2021-03-01 DIAGNOSIS — N186 End stage renal disease: Secondary | ICD-10-CM | POA: Diagnosis not present

## 2021-03-01 DIAGNOSIS — N2581 Secondary hyperparathyroidism of renal origin: Secondary | ICD-10-CM | POA: Diagnosis not present

## 2021-03-01 DIAGNOSIS — Z992 Dependence on renal dialysis: Secondary | ICD-10-CM | POA: Diagnosis not present

## 2021-03-03 DIAGNOSIS — Z992 Dependence on renal dialysis: Secondary | ICD-10-CM | POA: Diagnosis not present

## 2021-03-03 DIAGNOSIS — N2581 Secondary hyperparathyroidism of renal origin: Secondary | ICD-10-CM | POA: Diagnosis not present

## 2021-03-03 DIAGNOSIS — N186 End stage renal disease: Secondary | ICD-10-CM | POA: Diagnosis not present

## 2021-03-06 DIAGNOSIS — N2581 Secondary hyperparathyroidism of renal origin: Secondary | ICD-10-CM | POA: Diagnosis not present

## 2021-03-06 DIAGNOSIS — Z992 Dependence on renal dialysis: Secondary | ICD-10-CM | POA: Diagnosis not present

## 2021-03-06 DIAGNOSIS — N186 End stage renal disease: Secondary | ICD-10-CM | POA: Diagnosis not present

## 2021-03-08 DIAGNOSIS — N186 End stage renal disease: Secondary | ICD-10-CM | POA: Diagnosis not present

## 2021-03-08 DIAGNOSIS — N2581 Secondary hyperparathyroidism of renal origin: Secondary | ICD-10-CM | POA: Diagnosis not present

## 2021-03-08 DIAGNOSIS — Z992 Dependence on renal dialysis: Secondary | ICD-10-CM | POA: Diagnosis not present

## 2021-03-10 DIAGNOSIS — N186 End stage renal disease: Secondary | ICD-10-CM | POA: Diagnosis not present

## 2021-03-10 DIAGNOSIS — Z992 Dependence on renal dialysis: Secondary | ICD-10-CM | POA: Diagnosis not present

## 2021-03-10 DIAGNOSIS — N2581 Secondary hyperparathyroidism of renal origin: Secondary | ICD-10-CM | POA: Diagnosis not present

## 2021-03-12 ENCOUNTER — Other Ambulatory Visit: Payer: Self-pay

## 2021-03-12 ENCOUNTER — Ambulatory Visit: Payer: Medicare HMO | Attending: Family Medicine

## 2021-03-12 VITALS — BP 135/73 | HR 78 | Ht 71.0 in | Wt 327.0 lb

## 2021-03-12 DIAGNOSIS — Z23 Encounter for immunization: Secondary | ICD-10-CM

## 2021-03-13 DIAGNOSIS — N186 End stage renal disease: Secondary | ICD-10-CM | POA: Diagnosis not present

## 2021-03-13 DIAGNOSIS — N2581 Secondary hyperparathyroidism of renal origin: Secondary | ICD-10-CM | POA: Diagnosis not present

## 2021-03-13 DIAGNOSIS — Z992 Dependence on renal dialysis: Secondary | ICD-10-CM | POA: Diagnosis not present

## 2021-03-13 NOTE — Progress Notes (Signed)
Shingles pt tolerated well

## 2021-03-14 DIAGNOSIS — E662 Morbid (severe) obesity with alveolar hypoventilation: Secondary | ICD-10-CM | POA: Diagnosis not present

## 2021-03-14 DIAGNOSIS — J449 Chronic obstructive pulmonary disease, unspecified: Secondary | ICD-10-CM | POA: Diagnosis not present

## 2021-03-14 DIAGNOSIS — J9610 Chronic respiratory failure, unspecified whether with hypoxia or hypercapnia: Secondary | ICD-10-CM | POA: Diagnosis not present

## 2021-03-15 DIAGNOSIS — Z992 Dependence on renal dialysis: Secondary | ICD-10-CM | POA: Diagnosis not present

## 2021-03-15 DIAGNOSIS — N186 End stage renal disease: Secondary | ICD-10-CM | POA: Diagnosis not present

## 2021-03-15 DIAGNOSIS — N2581 Secondary hyperparathyroidism of renal origin: Secondary | ICD-10-CM | POA: Diagnosis not present

## 2021-03-17 DIAGNOSIS — Z992 Dependence on renal dialysis: Secondary | ICD-10-CM | POA: Diagnosis not present

## 2021-03-17 DIAGNOSIS — N186 End stage renal disease: Secondary | ICD-10-CM | POA: Diagnosis not present

## 2021-03-17 DIAGNOSIS — N2581 Secondary hyperparathyroidism of renal origin: Secondary | ICD-10-CM | POA: Diagnosis not present

## 2021-03-20 DIAGNOSIS — N2581 Secondary hyperparathyroidism of renal origin: Secondary | ICD-10-CM | POA: Diagnosis not present

## 2021-03-20 DIAGNOSIS — Z992 Dependence on renal dialysis: Secondary | ICD-10-CM | POA: Diagnosis not present

## 2021-03-20 DIAGNOSIS — N186 End stage renal disease: Secondary | ICD-10-CM | POA: Diagnosis not present

## 2021-03-22 DIAGNOSIS — Z992 Dependence on renal dialysis: Secondary | ICD-10-CM | POA: Diagnosis not present

## 2021-03-22 DIAGNOSIS — N186 End stage renal disease: Secondary | ICD-10-CM | POA: Diagnosis not present

## 2021-03-22 DIAGNOSIS — N2581 Secondary hyperparathyroidism of renal origin: Secondary | ICD-10-CM | POA: Diagnosis not present

## 2021-03-24 DIAGNOSIS — Z992 Dependence on renal dialysis: Secondary | ICD-10-CM | POA: Diagnosis not present

## 2021-03-24 DIAGNOSIS — N186 End stage renal disease: Secondary | ICD-10-CM | POA: Diagnosis not present

## 2021-03-24 DIAGNOSIS — N2581 Secondary hyperparathyroidism of renal origin: Secondary | ICD-10-CM | POA: Diagnosis not present

## 2021-03-26 DIAGNOSIS — Z992 Dependence on renal dialysis: Secondary | ICD-10-CM | POA: Diagnosis not present

## 2021-03-26 DIAGNOSIS — E1122 Type 2 diabetes mellitus with diabetic chronic kidney disease: Secondary | ICD-10-CM | POA: Diagnosis not present

## 2021-03-26 DIAGNOSIS — N186 End stage renal disease: Secondary | ICD-10-CM | POA: Diagnosis not present

## 2021-03-27 DIAGNOSIS — N2581 Secondary hyperparathyroidism of renal origin: Secondary | ICD-10-CM | POA: Diagnosis not present

## 2021-03-27 DIAGNOSIS — N186 End stage renal disease: Secondary | ICD-10-CM | POA: Diagnosis not present

## 2021-03-27 DIAGNOSIS — Z992 Dependence on renal dialysis: Secondary | ICD-10-CM | POA: Diagnosis not present

## 2021-03-29 DIAGNOSIS — N2581 Secondary hyperparathyroidism of renal origin: Secondary | ICD-10-CM | POA: Diagnosis not present

## 2021-03-29 DIAGNOSIS — N186 End stage renal disease: Secondary | ICD-10-CM | POA: Diagnosis not present

## 2021-03-29 DIAGNOSIS — Z992 Dependence on renal dialysis: Secondary | ICD-10-CM | POA: Diagnosis not present

## 2021-03-31 DIAGNOSIS — N2581 Secondary hyperparathyroidism of renal origin: Secondary | ICD-10-CM | POA: Diagnosis not present

## 2021-03-31 DIAGNOSIS — N186 End stage renal disease: Secondary | ICD-10-CM | POA: Diagnosis not present

## 2021-03-31 DIAGNOSIS — Z992 Dependence on renal dialysis: Secondary | ICD-10-CM | POA: Diagnosis not present

## 2021-04-03 DIAGNOSIS — Z992 Dependence on renal dialysis: Secondary | ICD-10-CM | POA: Diagnosis not present

## 2021-04-03 DIAGNOSIS — N186 End stage renal disease: Secondary | ICD-10-CM | POA: Diagnosis not present

## 2021-04-03 DIAGNOSIS — N2581 Secondary hyperparathyroidism of renal origin: Secondary | ICD-10-CM | POA: Diagnosis not present

## 2021-04-05 DIAGNOSIS — N2581 Secondary hyperparathyroidism of renal origin: Secondary | ICD-10-CM | POA: Diagnosis not present

## 2021-04-05 DIAGNOSIS — Z992 Dependence on renal dialysis: Secondary | ICD-10-CM | POA: Diagnosis not present

## 2021-04-05 DIAGNOSIS — N186 End stage renal disease: Secondary | ICD-10-CM | POA: Diagnosis not present

## 2021-04-07 DIAGNOSIS — N2581 Secondary hyperparathyroidism of renal origin: Secondary | ICD-10-CM | POA: Diagnosis not present

## 2021-04-07 DIAGNOSIS — N186 End stage renal disease: Secondary | ICD-10-CM | POA: Diagnosis not present

## 2021-04-07 DIAGNOSIS — Z992 Dependence on renal dialysis: Secondary | ICD-10-CM | POA: Diagnosis not present

## 2021-04-10 DIAGNOSIS — N186 End stage renal disease: Secondary | ICD-10-CM | POA: Diagnosis not present

## 2021-04-10 DIAGNOSIS — N2581 Secondary hyperparathyroidism of renal origin: Secondary | ICD-10-CM | POA: Diagnosis not present

## 2021-04-10 DIAGNOSIS — Z992 Dependence on renal dialysis: Secondary | ICD-10-CM | POA: Diagnosis not present

## 2021-04-12 DIAGNOSIS — Z992 Dependence on renal dialysis: Secondary | ICD-10-CM | POA: Diagnosis not present

## 2021-04-12 DIAGNOSIS — N2581 Secondary hyperparathyroidism of renal origin: Secondary | ICD-10-CM | POA: Diagnosis not present

## 2021-04-12 DIAGNOSIS — N186 End stage renal disease: Secondary | ICD-10-CM | POA: Diagnosis not present

## 2021-04-14 DIAGNOSIS — Z992 Dependence on renal dialysis: Secondary | ICD-10-CM | POA: Diagnosis not present

## 2021-04-14 DIAGNOSIS — N2581 Secondary hyperparathyroidism of renal origin: Secondary | ICD-10-CM | POA: Diagnosis not present

## 2021-04-14 DIAGNOSIS — N186 End stage renal disease: Secondary | ICD-10-CM | POA: Diagnosis not present

## 2021-04-17 DIAGNOSIS — N186 End stage renal disease: Secondary | ICD-10-CM | POA: Diagnosis not present

## 2021-04-17 DIAGNOSIS — N2581 Secondary hyperparathyroidism of renal origin: Secondary | ICD-10-CM | POA: Diagnosis not present

## 2021-04-17 DIAGNOSIS — Z992 Dependence on renal dialysis: Secondary | ICD-10-CM | POA: Diagnosis not present

## 2021-04-20 DIAGNOSIS — N2581 Secondary hyperparathyroidism of renal origin: Secondary | ICD-10-CM | POA: Diagnosis not present

## 2021-04-20 DIAGNOSIS — Z992 Dependence on renal dialysis: Secondary | ICD-10-CM | POA: Diagnosis not present

## 2021-04-20 DIAGNOSIS — N186 End stage renal disease: Secondary | ICD-10-CM | POA: Diagnosis not present

## 2021-04-22 DIAGNOSIS — Z992 Dependence on renal dialysis: Secondary | ICD-10-CM | POA: Diagnosis not present

## 2021-04-22 DIAGNOSIS — N2581 Secondary hyperparathyroidism of renal origin: Secondary | ICD-10-CM | POA: Diagnosis not present

## 2021-04-22 DIAGNOSIS — N186 End stage renal disease: Secondary | ICD-10-CM | POA: Diagnosis not present

## 2021-04-24 DIAGNOSIS — N186 End stage renal disease: Secondary | ICD-10-CM | POA: Diagnosis not present

## 2021-04-24 DIAGNOSIS — N2581 Secondary hyperparathyroidism of renal origin: Secondary | ICD-10-CM | POA: Diagnosis not present

## 2021-04-24 DIAGNOSIS — Z992 Dependence on renal dialysis: Secondary | ICD-10-CM | POA: Diagnosis not present

## 2021-04-25 DIAGNOSIS — N186 End stage renal disease: Secondary | ICD-10-CM | POA: Diagnosis not present

## 2021-04-25 DIAGNOSIS — E1122 Type 2 diabetes mellitus with diabetic chronic kidney disease: Secondary | ICD-10-CM | POA: Diagnosis not present

## 2021-04-25 DIAGNOSIS — Z992 Dependence on renal dialysis: Secondary | ICD-10-CM | POA: Diagnosis not present

## 2021-04-26 DIAGNOSIS — N2581 Secondary hyperparathyroidism of renal origin: Secondary | ICD-10-CM | POA: Diagnosis not present

## 2021-04-26 DIAGNOSIS — Z992 Dependence on renal dialysis: Secondary | ICD-10-CM | POA: Diagnosis not present

## 2021-04-26 DIAGNOSIS — N186 End stage renal disease: Secondary | ICD-10-CM | POA: Diagnosis not present

## 2021-04-28 DIAGNOSIS — N186 End stage renal disease: Secondary | ICD-10-CM | POA: Diagnosis not present

## 2021-04-28 DIAGNOSIS — Z992 Dependence on renal dialysis: Secondary | ICD-10-CM | POA: Diagnosis not present

## 2021-04-28 DIAGNOSIS — N2581 Secondary hyperparathyroidism of renal origin: Secondary | ICD-10-CM | POA: Diagnosis not present

## 2021-05-01 DIAGNOSIS — Z992 Dependence on renal dialysis: Secondary | ICD-10-CM | POA: Diagnosis not present

## 2021-05-01 DIAGNOSIS — N186 End stage renal disease: Secondary | ICD-10-CM | POA: Diagnosis not present

## 2021-05-01 DIAGNOSIS — N2581 Secondary hyperparathyroidism of renal origin: Secondary | ICD-10-CM | POA: Diagnosis not present

## 2021-05-03 DIAGNOSIS — Z992 Dependence on renal dialysis: Secondary | ICD-10-CM | POA: Diagnosis not present

## 2021-05-03 DIAGNOSIS — N186 End stage renal disease: Secondary | ICD-10-CM | POA: Diagnosis not present

## 2021-05-03 DIAGNOSIS — N2581 Secondary hyperparathyroidism of renal origin: Secondary | ICD-10-CM | POA: Diagnosis not present

## 2021-05-05 DIAGNOSIS — N2581 Secondary hyperparathyroidism of renal origin: Secondary | ICD-10-CM | POA: Diagnosis not present

## 2021-05-05 DIAGNOSIS — N186 End stage renal disease: Secondary | ICD-10-CM | POA: Diagnosis not present

## 2021-05-05 DIAGNOSIS — Z992 Dependence on renal dialysis: Secondary | ICD-10-CM | POA: Diagnosis not present

## 2021-05-08 DIAGNOSIS — N186 End stage renal disease: Secondary | ICD-10-CM | POA: Diagnosis not present

## 2021-05-08 DIAGNOSIS — N2581 Secondary hyperparathyroidism of renal origin: Secondary | ICD-10-CM | POA: Diagnosis not present

## 2021-05-08 DIAGNOSIS — Z992 Dependence on renal dialysis: Secondary | ICD-10-CM | POA: Diagnosis not present

## 2021-05-10 DIAGNOSIS — N2581 Secondary hyperparathyroidism of renal origin: Secondary | ICD-10-CM | POA: Diagnosis not present

## 2021-05-10 DIAGNOSIS — Z992 Dependence on renal dialysis: Secondary | ICD-10-CM | POA: Diagnosis not present

## 2021-05-10 DIAGNOSIS — N186 End stage renal disease: Secondary | ICD-10-CM | POA: Diagnosis not present

## 2021-05-12 DIAGNOSIS — N2581 Secondary hyperparathyroidism of renal origin: Secondary | ICD-10-CM | POA: Diagnosis not present

## 2021-05-12 DIAGNOSIS — N186 End stage renal disease: Secondary | ICD-10-CM | POA: Diagnosis not present

## 2021-05-12 DIAGNOSIS — Z992 Dependence on renal dialysis: Secondary | ICD-10-CM | POA: Diagnosis not present

## 2021-05-15 DIAGNOSIS — N2581 Secondary hyperparathyroidism of renal origin: Secondary | ICD-10-CM | POA: Diagnosis not present

## 2021-05-15 DIAGNOSIS — N186 End stage renal disease: Secondary | ICD-10-CM | POA: Diagnosis not present

## 2021-05-15 DIAGNOSIS — Z992 Dependence on renal dialysis: Secondary | ICD-10-CM | POA: Diagnosis not present

## 2021-05-17 DIAGNOSIS — Z992 Dependence on renal dialysis: Secondary | ICD-10-CM | POA: Diagnosis not present

## 2021-05-17 DIAGNOSIS — N2581 Secondary hyperparathyroidism of renal origin: Secondary | ICD-10-CM | POA: Diagnosis not present

## 2021-05-17 DIAGNOSIS — N186 End stage renal disease: Secondary | ICD-10-CM | POA: Diagnosis not present

## 2021-05-19 DIAGNOSIS — N186 End stage renal disease: Secondary | ICD-10-CM | POA: Diagnosis not present

## 2021-05-19 DIAGNOSIS — Z992 Dependence on renal dialysis: Secondary | ICD-10-CM | POA: Diagnosis not present

## 2021-05-19 DIAGNOSIS — N2581 Secondary hyperparathyroidism of renal origin: Secondary | ICD-10-CM | POA: Diagnosis not present

## 2021-05-22 DIAGNOSIS — N2581 Secondary hyperparathyroidism of renal origin: Secondary | ICD-10-CM | POA: Diagnosis not present

## 2021-05-22 DIAGNOSIS — Z992 Dependence on renal dialysis: Secondary | ICD-10-CM | POA: Diagnosis not present

## 2021-05-22 DIAGNOSIS — N186 End stage renal disease: Secondary | ICD-10-CM | POA: Diagnosis not present

## 2021-05-24 DIAGNOSIS — N2581 Secondary hyperparathyroidism of renal origin: Secondary | ICD-10-CM | POA: Diagnosis not present

## 2021-05-24 DIAGNOSIS — N186 End stage renal disease: Secondary | ICD-10-CM | POA: Diagnosis not present

## 2021-05-24 DIAGNOSIS — Z992 Dependence on renal dialysis: Secondary | ICD-10-CM | POA: Diagnosis not present

## 2021-05-26 DIAGNOSIS — Z992 Dependence on renal dialysis: Secondary | ICD-10-CM | POA: Diagnosis not present

## 2021-05-26 DIAGNOSIS — N186 End stage renal disease: Secondary | ICD-10-CM | POA: Diagnosis not present

## 2021-05-26 DIAGNOSIS — E1122 Type 2 diabetes mellitus with diabetic chronic kidney disease: Secondary | ICD-10-CM | POA: Diagnosis not present

## 2021-05-26 DIAGNOSIS — N2581 Secondary hyperparathyroidism of renal origin: Secondary | ICD-10-CM | POA: Diagnosis not present

## 2021-05-29 ENCOUNTER — Other Ambulatory Visit: Payer: Self-pay | Admitting: Family Medicine

## 2021-05-29 DIAGNOSIS — E1169 Type 2 diabetes mellitus with other specified complication: Secondary | ICD-10-CM

## 2021-05-29 DIAGNOSIS — N2581 Secondary hyperparathyroidism of renal origin: Secondary | ICD-10-CM | POA: Diagnosis not present

## 2021-05-29 DIAGNOSIS — I10 Essential (primary) hypertension: Secondary | ICD-10-CM

## 2021-05-29 DIAGNOSIS — N186 End stage renal disease: Secondary | ICD-10-CM | POA: Diagnosis not present

## 2021-05-29 DIAGNOSIS — Z992 Dependence on renal dialysis: Secondary | ICD-10-CM | POA: Diagnosis not present

## 2021-05-31 DIAGNOSIS — Z992 Dependence on renal dialysis: Secondary | ICD-10-CM | POA: Diagnosis not present

## 2021-05-31 DIAGNOSIS — N2581 Secondary hyperparathyroidism of renal origin: Secondary | ICD-10-CM | POA: Diagnosis not present

## 2021-05-31 DIAGNOSIS — N186 End stage renal disease: Secondary | ICD-10-CM | POA: Diagnosis not present

## 2021-06-02 DIAGNOSIS — N2581 Secondary hyperparathyroidism of renal origin: Secondary | ICD-10-CM | POA: Diagnosis not present

## 2021-06-02 DIAGNOSIS — Z992 Dependence on renal dialysis: Secondary | ICD-10-CM | POA: Diagnosis not present

## 2021-06-02 DIAGNOSIS — N186 End stage renal disease: Secondary | ICD-10-CM | POA: Diagnosis not present

## 2021-06-05 DIAGNOSIS — N186 End stage renal disease: Secondary | ICD-10-CM | POA: Diagnosis not present

## 2021-06-05 DIAGNOSIS — N2581 Secondary hyperparathyroidism of renal origin: Secondary | ICD-10-CM | POA: Diagnosis not present

## 2021-06-05 DIAGNOSIS — Z992 Dependence on renal dialysis: Secondary | ICD-10-CM | POA: Diagnosis not present

## 2021-06-07 DIAGNOSIS — N186 End stage renal disease: Secondary | ICD-10-CM | POA: Diagnosis not present

## 2021-06-07 DIAGNOSIS — Z992 Dependence on renal dialysis: Secondary | ICD-10-CM | POA: Diagnosis not present

## 2021-06-07 DIAGNOSIS — N2581 Secondary hyperparathyroidism of renal origin: Secondary | ICD-10-CM | POA: Diagnosis not present

## 2021-06-09 DIAGNOSIS — N2581 Secondary hyperparathyroidism of renal origin: Secondary | ICD-10-CM | POA: Diagnosis not present

## 2021-06-09 DIAGNOSIS — N186 End stage renal disease: Secondary | ICD-10-CM | POA: Diagnosis not present

## 2021-06-09 DIAGNOSIS — Z992 Dependence on renal dialysis: Secondary | ICD-10-CM | POA: Diagnosis not present

## 2021-06-12 DIAGNOSIS — N2581 Secondary hyperparathyroidism of renal origin: Secondary | ICD-10-CM | POA: Diagnosis not present

## 2021-06-12 DIAGNOSIS — N186 End stage renal disease: Secondary | ICD-10-CM | POA: Diagnosis not present

## 2021-06-12 DIAGNOSIS — Z992 Dependence on renal dialysis: Secondary | ICD-10-CM | POA: Diagnosis not present

## 2021-06-14 DIAGNOSIS — N186 End stage renal disease: Secondary | ICD-10-CM | POA: Diagnosis not present

## 2021-06-14 DIAGNOSIS — Z992 Dependence on renal dialysis: Secondary | ICD-10-CM | POA: Diagnosis not present

## 2021-06-14 DIAGNOSIS — N2581 Secondary hyperparathyroidism of renal origin: Secondary | ICD-10-CM | POA: Diagnosis not present

## 2021-06-16 DIAGNOSIS — N186 End stage renal disease: Secondary | ICD-10-CM | POA: Diagnosis not present

## 2021-06-16 DIAGNOSIS — Z992 Dependence on renal dialysis: Secondary | ICD-10-CM | POA: Diagnosis not present

## 2021-06-16 DIAGNOSIS — N2581 Secondary hyperparathyroidism of renal origin: Secondary | ICD-10-CM | POA: Diagnosis not present

## 2021-06-19 DIAGNOSIS — N186 End stage renal disease: Secondary | ICD-10-CM | POA: Diagnosis not present

## 2021-06-19 DIAGNOSIS — N2581 Secondary hyperparathyroidism of renal origin: Secondary | ICD-10-CM | POA: Diagnosis not present

## 2021-06-19 DIAGNOSIS — Z992 Dependence on renal dialysis: Secondary | ICD-10-CM | POA: Diagnosis not present

## 2021-06-21 DIAGNOSIS — N186 End stage renal disease: Secondary | ICD-10-CM | POA: Diagnosis not present

## 2021-06-21 DIAGNOSIS — Z992 Dependence on renal dialysis: Secondary | ICD-10-CM | POA: Diagnosis not present

## 2021-06-21 DIAGNOSIS — N2581 Secondary hyperparathyroidism of renal origin: Secondary | ICD-10-CM | POA: Diagnosis not present

## 2021-06-23 DIAGNOSIS — N186 End stage renal disease: Secondary | ICD-10-CM | POA: Diagnosis not present

## 2021-06-23 DIAGNOSIS — N2581 Secondary hyperparathyroidism of renal origin: Secondary | ICD-10-CM | POA: Diagnosis not present

## 2021-06-23 DIAGNOSIS — Z992 Dependence on renal dialysis: Secondary | ICD-10-CM | POA: Diagnosis not present

## 2021-06-26 DIAGNOSIS — N186 End stage renal disease: Secondary | ICD-10-CM | POA: Diagnosis not present

## 2021-06-26 DIAGNOSIS — E1122 Type 2 diabetes mellitus with diabetic chronic kidney disease: Secondary | ICD-10-CM | POA: Diagnosis not present

## 2021-06-26 DIAGNOSIS — Z992 Dependence on renal dialysis: Secondary | ICD-10-CM | POA: Diagnosis not present

## 2021-06-26 DIAGNOSIS — N2581 Secondary hyperparathyroidism of renal origin: Secondary | ICD-10-CM | POA: Diagnosis not present

## 2021-06-28 DIAGNOSIS — N2581 Secondary hyperparathyroidism of renal origin: Secondary | ICD-10-CM | POA: Diagnosis not present

## 2021-06-28 DIAGNOSIS — Z992 Dependence on renal dialysis: Secondary | ICD-10-CM | POA: Diagnosis not present

## 2021-06-28 DIAGNOSIS — N186 End stage renal disease: Secondary | ICD-10-CM | POA: Diagnosis not present

## 2021-06-30 DIAGNOSIS — N2581 Secondary hyperparathyroidism of renal origin: Secondary | ICD-10-CM | POA: Diagnosis not present

## 2021-06-30 DIAGNOSIS — N186 End stage renal disease: Secondary | ICD-10-CM | POA: Diagnosis not present

## 2021-06-30 DIAGNOSIS — Z992 Dependence on renal dialysis: Secondary | ICD-10-CM | POA: Diagnosis not present

## 2021-07-02 DIAGNOSIS — Z992 Dependence on renal dialysis: Secondary | ICD-10-CM | POA: Diagnosis not present

## 2021-07-02 DIAGNOSIS — I871 Compression of vein: Secondary | ICD-10-CM | POA: Diagnosis not present

## 2021-07-02 DIAGNOSIS — N186 End stage renal disease: Secondary | ICD-10-CM | POA: Diagnosis not present

## 2021-07-03 DIAGNOSIS — Z992 Dependence on renal dialysis: Secondary | ICD-10-CM | POA: Diagnosis not present

## 2021-07-03 DIAGNOSIS — N186 End stage renal disease: Secondary | ICD-10-CM | POA: Diagnosis not present

## 2021-07-03 DIAGNOSIS — N2581 Secondary hyperparathyroidism of renal origin: Secondary | ICD-10-CM | POA: Diagnosis not present

## 2021-07-05 DIAGNOSIS — N186 End stage renal disease: Secondary | ICD-10-CM | POA: Diagnosis not present

## 2021-07-05 DIAGNOSIS — N2581 Secondary hyperparathyroidism of renal origin: Secondary | ICD-10-CM | POA: Diagnosis not present

## 2021-07-05 DIAGNOSIS — Z992 Dependence on renal dialysis: Secondary | ICD-10-CM | POA: Diagnosis not present

## 2021-07-07 DIAGNOSIS — N2581 Secondary hyperparathyroidism of renal origin: Secondary | ICD-10-CM | POA: Diagnosis not present

## 2021-07-07 DIAGNOSIS — N186 End stage renal disease: Secondary | ICD-10-CM | POA: Diagnosis not present

## 2021-07-07 DIAGNOSIS — Z992 Dependence on renal dialysis: Secondary | ICD-10-CM | POA: Diagnosis not present

## 2021-07-10 DIAGNOSIS — N186 End stage renal disease: Secondary | ICD-10-CM | POA: Diagnosis not present

## 2021-07-10 DIAGNOSIS — Z992 Dependence on renal dialysis: Secondary | ICD-10-CM | POA: Diagnosis not present

## 2021-07-10 DIAGNOSIS — N2581 Secondary hyperparathyroidism of renal origin: Secondary | ICD-10-CM | POA: Diagnosis not present

## 2021-07-12 DIAGNOSIS — N186 End stage renal disease: Secondary | ICD-10-CM | POA: Diagnosis not present

## 2021-07-12 DIAGNOSIS — N2581 Secondary hyperparathyroidism of renal origin: Secondary | ICD-10-CM | POA: Diagnosis not present

## 2021-07-12 DIAGNOSIS — Z992 Dependence on renal dialysis: Secondary | ICD-10-CM | POA: Diagnosis not present

## 2021-07-14 DIAGNOSIS — N2581 Secondary hyperparathyroidism of renal origin: Secondary | ICD-10-CM | POA: Diagnosis not present

## 2021-07-14 DIAGNOSIS — Z992 Dependence on renal dialysis: Secondary | ICD-10-CM | POA: Diagnosis not present

## 2021-07-14 DIAGNOSIS — N186 End stage renal disease: Secondary | ICD-10-CM | POA: Diagnosis not present

## 2021-07-17 DIAGNOSIS — N186 End stage renal disease: Secondary | ICD-10-CM | POA: Diagnosis not present

## 2021-07-17 DIAGNOSIS — N2581 Secondary hyperparathyroidism of renal origin: Secondary | ICD-10-CM | POA: Diagnosis not present

## 2021-07-17 DIAGNOSIS — Z992 Dependence on renal dialysis: Secondary | ICD-10-CM | POA: Diagnosis not present

## 2021-07-19 DIAGNOSIS — Z992 Dependence on renal dialysis: Secondary | ICD-10-CM | POA: Diagnosis not present

## 2021-07-19 DIAGNOSIS — N186 End stage renal disease: Secondary | ICD-10-CM | POA: Diagnosis not present

## 2021-07-19 DIAGNOSIS — N2581 Secondary hyperparathyroidism of renal origin: Secondary | ICD-10-CM | POA: Diagnosis not present

## 2021-07-21 DIAGNOSIS — Z992 Dependence on renal dialysis: Secondary | ICD-10-CM | POA: Diagnosis not present

## 2021-07-21 DIAGNOSIS — N186 End stage renal disease: Secondary | ICD-10-CM | POA: Diagnosis not present

## 2021-07-21 DIAGNOSIS — N2581 Secondary hyperparathyroidism of renal origin: Secondary | ICD-10-CM | POA: Diagnosis not present

## 2021-07-24 DIAGNOSIS — E1122 Type 2 diabetes mellitus with diabetic chronic kidney disease: Secondary | ICD-10-CM | POA: Diagnosis not present

## 2021-07-24 DIAGNOSIS — N2581 Secondary hyperparathyroidism of renal origin: Secondary | ICD-10-CM | POA: Diagnosis not present

## 2021-07-24 DIAGNOSIS — Z992 Dependence on renal dialysis: Secondary | ICD-10-CM | POA: Diagnosis not present

## 2021-07-24 DIAGNOSIS — N186 End stage renal disease: Secondary | ICD-10-CM | POA: Diagnosis not present

## 2021-07-26 DIAGNOSIS — Z992 Dependence on renal dialysis: Secondary | ICD-10-CM | POA: Diagnosis not present

## 2021-07-26 DIAGNOSIS — N2581 Secondary hyperparathyroidism of renal origin: Secondary | ICD-10-CM | POA: Diagnosis not present

## 2021-07-26 DIAGNOSIS — N186 End stage renal disease: Secondary | ICD-10-CM | POA: Diagnosis not present

## 2021-07-28 DIAGNOSIS — N2581 Secondary hyperparathyroidism of renal origin: Secondary | ICD-10-CM | POA: Diagnosis not present

## 2021-07-28 DIAGNOSIS — Z992 Dependence on renal dialysis: Secondary | ICD-10-CM | POA: Diagnosis not present

## 2021-07-28 DIAGNOSIS — N186 End stage renal disease: Secondary | ICD-10-CM | POA: Diagnosis not present

## 2021-07-31 DIAGNOSIS — Z992 Dependence on renal dialysis: Secondary | ICD-10-CM | POA: Diagnosis not present

## 2021-07-31 DIAGNOSIS — N186 End stage renal disease: Secondary | ICD-10-CM | POA: Diagnosis not present

## 2021-07-31 DIAGNOSIS — N2581 Secondary hyperparathyroidism of renal origin: Secondary | ICD-10-CM | POA: Diagnosis not present

## 2021-08-02 DIAGNOSIS — N2581 Secondary hyperparathyroidism of renal origin: Secondary | ICD-10-CM | POA: Diagnosis not present

## 2021-08-02 DIAGNOSIS — N186 End stage renal disease: Secondary | ICD-10-CM | POA: Diagnosis not present

## 2021-08-02 DIAGNOSIS — Z992 Dependence on renal dialysis: Secondary | ICD-10-CM | POA: Diagnosis not present

## 2021-08-04 DIAGNOSIS — Z992 Dependence on renal dialysis: Secondary | ICD-10-CM | POA: Diagnosis not present

## 2021-08-04 DIAGNOSIS — N186 End stage renal disease: Secondary | ICD-10-CM | POA: Diagnosis not present

## 2021-08-04 DIAGNOSIS — N2581 Secondary hyperparathyroidism of renal origin: Secondary | ICD-10-CM | POA: Diagnosis not present

## 2021-08-07 DIAGNOSIS — Z992 Dependence on renal dialysis: Secondary | ICD-10-CM | POA: Diagnosis not present

## 2021-08-07 DIAGNOSIS — N186 End stage renal disease: Secondary | ICD-10-CM | POA: Diagnosis not present

## 2021-08-07 DIAGNOSIS — N2581 Secondary hyperparathyroidism of renal origin: Secondary | ICD-10-CM | POA: Diagnosis not present

## 2021-08-09 DIAGNOSIS — N186 End stage renal disease: Secondary | ICD-10-CM | POA: Diagnosis not present

## 2021-08-09 DIAGNOSIS — Z992 Dependence on renal dialysis: Secondary | ICD-10-CM | POA: Diagnosis not present

## 2021-08-09 DIAGNOSIS — N2581 Secondary hyperparathyroidism of renal origin: Secondary | ICD-10-CM | POA: Diagnosis not present

## 2021-08-11 DIAGNOSIS — Z992 Dependence on renal dialysis: Secondary | ICD-10-CM | POA: Diagnosis not present

## 2021-08-11 DIAGNOSIS — N2581 Secondary hyperparathyroidism of renal origin: Secondary | ICD-10-CM | POA: Diagnosis not present

## 2021-08-11 DIAGNOSIS — N186 End stage renal disease: Secondary | ICD-10-CM | POA: Diagnosis not present

## 2021-08-14 DIAGNOSIS — Z992 Dependence on renal dialysis: Secondary | ICD-10-CM | POA: Diagnosis not present

## 2021-08-14 DIAGNOSIS — N186 End stage renal disease: Secondary | ICD-10-CM | POA: Diagnosis not present

## 2021-08-14 DIAGNOSIS — N2581 Secondary hyperparathyroidism of renal origin: Secondary | ICD-10-CM | POA: Diagnosis not present

## 2021-08-16 DIAGNOSIS — N2581 Secondary hyperparathyroidism of renal origin: Secondary | ICD-10-CM | POA: Diagnosis not present

## 2021-08-16 DIAGNOSIS — Z992 Dependence on renal dialysis: Secondary | ICD-10-CM | POA: Diagnosis not present

## 2021-08-16 DIAGNOSIS — N186 End stage renal disease: Secondary | ICD-10-CM | POA: Diagnosis not present

## 2021-08-18 DIAGNOSIS — Z992 Dependence on renal dialysis: Secondary | ICD-10-CM | POA: Diagnosis not present

## 2021-08-18 DIAGNOSIS — N186 End stage renal disease: Secondary | ICD-10-CM | POA: Diagnosis not present

## 2021-08-18 DIAGNOSIS — N2581 Secondary hyperparathyroidism of renal origin: Secondary | ICD-10-CM | POA: Diagnosis not present

## 2021-08-20 ENCOUNTER — Other Ambulatory Visit: Payer: Self-pay

## 2021-08-20 ENCOUNTER — Encounter: Payer: Self-pay | Admitting: Family Medicine

## 2021-08-20 ENCOUNTER — Ambulatory Visit: Payer: Medicare HMO | Attending: Family Medicine | Admitting: Family Medicine

## 2021-08-20 VITALS — BP 143/84 | HR 78 | Ht 71.0 in | Wt 329.0 lb

## 2021-08-20 DIAGNOSIS — N186 End stage renal disease: Secondary | ICD-10-CM

## 2021-08-20 DIAGNOSIS — Z6841 Body Mass Index (BMI) 40.0 and over, adult: Secondary | ICD-10-CM

## 2021-08-20 DIAGNOSIS — E1122 Type 2 diabetes mellitus with diabetic chronic kidney disease: Secondary | ICD-10-CM

## 2021-08-20 DIAGNOSIS — Z992 Dependence on renal dialysis: Secondary | ICD-10-CM

## 2021-08-20 DIAGNOSIS — R252 Cramp and spasm: Secondary | ICD-10-CM | POA: Diagnosis not present

## 2021-08-20 DIAGNOSIS — K5909 Other constipation: Secondary | ICD-10-CM

## 2021-08-20 DIAGNOSIS — I1311 Hypertensive heart and chronic kidney disease without heart failure, with stage 5 chronic kidney disease, or end stage renal disease: Secondary | ICD-10-CM

## 2021-08-20 DIAGNOSIS — J438 Other emphysema: Secondary | ICD-10-CM

## 2021-08-20 LAB — POCT GLYCOSYLATED HEMOGLOBIN (HGB A1C): HbA1c, POC (controlled diabetic range): 5.8 % (ref 0.0–7.0)

## 2021-08-20 LAB — GLUCOSE, POCT (MANUAL RESULT ENTRY): POC Glucose: 90 mg/dl (ref 70–99)

## 2021-08-20 MED ORDER — TRULICITY 1.5 MG/0.5ML ~~LOC~~ SOAJ: 1.5000 mg | SUBCUTANEOUS | 6 refills | Status: DC

## 2021-08-20 MED ORDER — POLYETHYLENE GLYCOL 3350 17 GM/SCOOP PO POWD: 17.0000 g | Freq: Every day | ORAL | 1 refills | Status: DC

## 2021-08-20 NOTE — Patient Instructions (Signed)

## 2021-08-20 NOTE — Progress Notes (Signed)
? ?Established Patient Office Visit ? ?Subjective:  ?Patient ID: Adam Dennis, male    DOB: 1960-07-07  Age: 61 y.o. MRN: [id number] ? ?CC:  ?Chief Complaint  ?Patient presents with  ? Diabetes  ? ? ?HPI ?Adam Dennis  is a 61 year old male with a history of type 2 diabetes (H3Z 5.8 on 1.69 Trulicity), hypertension, ESRD on HD, obstructive sleep apnea (on CPAP at night), combined systolic and diastolic CHF (EF 67-89% from echo of 06/2019), and emphysema (O2 sat 94% on room air) who presents today for chronic condition management.  ? ?Interval history:  ?His diabetes mellitus is controlled on Trulicity 3.81 injections. He denies hypoglycemic episodes and numbness in his extremities. He does not check his blood sugar at home. He walks for 30 minutes 3 times per week.  ? ?His hemodialysis sessions are going well, though he states he continues to experience leg and chest cramps and exhaustion after HD sessions. He reports the Robaxin previously prescribed to manage these symptoms does not work to control his cramps.  ? ?His emphysema is well controlled, he does not use albuterol regularly and denies any dyspnea. Upon arrival to the clinic today, his O2 sats had dropped to 89%, but were back up to 94% by the end of the clinic visit.  ? ?His CHF is also controlled, he denies leg swelling, dyspnea, chest pain, and sleeps with 2 "flat" pillows, and has no trouble breathing at night. He endorses following a low salt diet. He has not needed to use nitroglycerin.  ? ?For OSA, he uses CPAP every night. Recently, he had to replace some tubing and was unable to sleep for several nights while his CPAP needed repair, but it is now back in working order.  ? ?His weight is 329lbs, up 2lbs from 03/13/2020.  ? ?He endorses recent development of constipation, with straining and light rectal bleeding with bowel movements. He attributes this to his "binder" medication, and is currently non-adherent to suggested daily miralax.  ? ?Past  Medical History:  ?Diagnosis Date  ? Arthritis   ? "back" (02/06/2017)  ? CHF (congestive heart failure) ([place])   ? new onset /Encounter Date 09/12/2006  ? Chronic combined systolic and diastolic heart failure ([place])   ? CKD (chronic kidney disease) stage 4, GFR 15-29 ml/min (HCC)   ? COPD (chronic obstructive pulmonary disease) ([place])   ? End stage renal disease on dialysis [facility])   ? Tues/ Thur/ [address] [place]  ? Glaucoma, right eye   ? High cholesterol   ? Hypertension   ? Hypertrophy of tonsils alone   ? Myocardial infarction [facility])   ? "small one; ~ 2008" (02/06/2017)  ? Obesity, unspecified   ? On home oxygen therapy   ? "4L; 24/7" (04/10/2017)  ? OSA on CPAP   ? Type II diabetes mellitus ([place])   ? ? ?Past Surgical History:  ?Procedure Laterality Date  ? AV FISTULA PLACEMENT Right 07/01/2019  ? Procedure: Right arm arteriovenous fistual;  Surgeon: Waynetta Sandy, MD;  Location: [place];  Service: Vascular;  Laterality: Right;  ? FISTULA SUPERFICIALIZATION Right 09/01/2019  ? Procedure: RIGHT UPPER ARM FISTULA  TRANSPOSITION OF RIGHT UPPPER ARM BRACHIAL CEPHALIC FISTULA;  Surgeon: Waynetta Sandy, MD;  Location: [place];  Service: Vascular;  Laterality: Right;  ? HERNIA REPAIR    ? INSERTION OF DIALYSIS CATHETER Right 07/01/2019  ? Procedure: INSERTION OF TUNNELED DIALYSIS CATHETER;  Surgeon: Waynetta Sandy, MD;  [facility];  Service: Vascular;  Laterality: Right;  ? TEE WITHOUT CARDIOVERSION N/A 04/16/2017  ? Procedure: TRANSESOPHAGEAL ECHOCARDIOGRAM (TEE);  Surgeon: [name], MD;  Location[facility] ENDOSCOPY;  Service: Cardiovascular;  Laterality: N/A;  ? UMBILICAL HERNIA REPAIR  1960s  ? "when I was little baby"  ? ? ?Family History  ?Problem Relation Age of Onset  ? Hypertension Mother   ? Diabetes Brother   ? Diabetes Sister   ? Cancer Maternal Aunt   ? Amblyopia Neg Hx   ? Blindness Neg Hx   ? Cataracts Neg Hx   ? Glaucoma Neg Hx   ? Macular degeneration Neg Hx   ? Strabismus  Neg Hx   ? Retinal detachment Neg Hx   ? Retinitis pigmentosa Neg Hx   ? ? ?Social History  ? ?Socioeconomic History  ? Marital status: Single  ?  Spouse name: Not on file  ? Number of children: 0  ? Years of education: Not on file  ? Highest education level: Not on file  ?Occupational History  ? Occupation: works as a Training and development officer  ?Tobacco Use  ? Smoking status: Never  ? Smokeless tobacco: Never  ?Vaping Use  ? Vaping Use: Never used  ?Substance and Sexual Activity  ? Alcohol use: No  ? Drug use: No  ? Sexual activity: Not Currently  ?Other Topics Concern  ? Not on file  ?Social History Narrative  ? Not on file  ? ?Social Determinants of Health  ? ?Financial Resource Strain: Not on file  ?Food Insecurity: Not on file  ?Transportation Needs: Not on file  ?Physical Activity: Not on file  ?Stress: Not on file  ?Social Connections: Not on file  ?Intimate Partner Violence: Not on file  ? ? ?Outpatient Medications Prior to Visit  ?Medication Sig Dispense Refill  ? ACCU-CHEK SOFTCLIX LANCETS lancets Use as instructed to check blood sugar up to 3 times daily. 100 each 11  ? acetaminophen (TYLENOL) 500 MG tablet Take 500 mg by mouth every 6 (six) hours as needed for moderate pain or headache.    ? amLODipine (NORVASC) 5 MG tablet TAKE 1 TABLET(5 MG) BY MOUTH DAILY 90 tablet 1  ? aspirin EC 81 MG tablet Take 81 mg by mouth daily.    ? atorvastatin (LIPITOR) 40 MG tablet TAKE 1 TABLET(40 MG) BY MOUTH DAILY 90 tablet 1  ? Blood Glucose Monitoring Suppl (ACCU-CHEK AVIVA) device Use as instructed to check blood sugar up to 3 times daily. 1 each 0  ? calcium carbonate (TUMS - DOSED IN MG ELEMENTAL CALCIUM) 500 MG chewable tablet Chew 1 tablet (200 mg of elemental calcium total) by mouth 2 (two) times daily. (Patient taking differently: Chew 500 mg by mouth 2 (two) times daily.)    ? cholecalciferol (VITAMIN D3) 25 MCG (1000 UNIT) tablet Take 1,000 Units by mouth daily.    ? glucose blood (ACCU-CHEK AVIVA PLUS) test strip Use as  instructed to check blood sugar up to 3 times daily. 100 each 11  ? Lancet Devices (ACCU-CHEK SOFTCLIX) lancets Use as instructed daily. 1 each 5  ? lidocaine-prilocaine (EMLA) cream Apply 1 application topically as needed (topical anesthesia for hemodialysis if Gebauers and Lidocaine injection are ineffective.). 30 g 0  ? Multiple Vitamin (MULTIVITAMIN ADULT) TABS Take 1 tablet by mouth daily. (Patient taking differently: Take 1 tablet by mouth at bedtime.) 30 tablet 3  ? nitroGLYCERIN (NITROSTAT) 0.4 MG SL tablet PLACE 1 TABLET BY MOUTH UNDER TONGUE  EVERY 5 MINUTES FOR 3 DOSES AS NEEDED FOR CHEST PAIN (Patient taking differently: Place 0.4 mg under the tongue every 5 (five) minutes as needed for chest pain.) 25 tablet 0  ? timolol (TIMOPTIC-XR) 0.5 % ophthalmic gel-forming Place 1 drop into both eyes daily.     ? TRAVATAN Z 0.004 % SOLN ophthalmic solution Place 1 drop into both eyes at bedtime.  12  ? TRULICITY 2.67 TI/4.5YK SOPN Inject into the skin.    ? albuterol (VENTOLIN HFA) 108 (90 Base) MCG/ACT inhaler INHALE 2 PUFFS INTO THE LUNGS EVERY 6 HOURS AS NEEDED FOR WHEEZING OR SHORTNESS OF BREATH (Patient not taking: Reported on 08/20/2021) 54 g 0  ? FEROSUL 325 (65 Fe) MG tablet TAKE 1 TABLET(325 MG) BY MOUTH DAILY WITH BREAKFAST (Patient not taking: Reported on 08/20/2021) 100 tablet 3  ? Fluticasone-Salmeterol (ADVAIR DISKUS) 250-50 MCG/DOSE AEPB Inhale 1 puff into the lungs 2 (two) times daily. (Patient not taking: Reported on 08/20/2021) 1 each 6  ? methocarbamol (ROBAXIN) 500 MG tablet TAKE 1 TABLET(500 MG) BY MOUTH TWICE DAILY AS NEEDED FOR MUSCLE SPASMS (Patient not taking: Reported on 08/20/2021) 60 tablet 1  ? oxyCODONE-acetaminophen (PERCOCET) 5-325 MG tablet Take 1 tablet by mouth every 6 (six) hours as needed. (Patient not taking: Reported on 08/20/2021) 15 tablet 0  ? ?No facility-administered medications prior to visit.  ? ? ?No Known Allergies ? ?ROS ?Review of Systems  ?Constitutional:  Negative for  fatigue.  ?HENT:  Negative for congestion, rhinorrhea and sore throat.   ?Respiratory:  Negative for shortness of breath.   ?Cardiovascular:  Negative for chest pain, palpitations and leg swelling.  ?[employer]

## 2021-08-21 DIAGNOSIS — Z992 Dependence on renal dialysis: Secondary | ICD-10-CM | POA: Diagnosis not present

## 2021-08-21 DIAGNOSIS — N2581 Secondary hyperparathyroidism of renal origin: Secondary | ICD-10-CM | POA: Diagnosis not present

## 2021-08-21 DIAGNOSIS — N186 End stage renal disease: Secondary | ICD-10-CM | POA: Diagnosis not present

## 2021-08-21 LAB — LP+NON-HDL CHOLESTEROL
Cholesterol, Total: 146 mg/dL (ref 100–199)
HDL: 54 mg/dL (ref >39)
LDL Chol Calc (NIH): 72 mg/dL (ref 0–99)
Total Non-HDL-Chol (LDL+VLDL): 92 mg/dL (ref 0–129)
Triglycerides: 108 mg/dL (ref 0–149)
VLDL Cholesterol Cal: 20 mg/dL (ref 5–40)

## 2021-08-23 DIAGNOSIS — N2581 Secondary hyperparathyroidism of renal origin: Secondary | ICD-10-CM | POA: Diagnosis not present

## 2021-08-23 DIAGNOSIS — N186 End stage renal disease: Secondary | ICD-10-CM | POA: Diagnosis not present

## 2021-08-23 DIAGNOSIS — Z992 Dependence on renal dialysis: Secondary | ICD-10-CM | POA: Diagnosis not present

## 2021-08-24 DIAGNOSIS — N186 End stage renal disease: Secondary | ICD-10-CM | POA: Diagnosis not present

## 2021-08-24 DIAGNOSIS — Z992 Dependence on renal dialysis: Secondary | ICD-10-CM | POA: Diagnosis not present

## 2021-08-24 DIAGNOSIS — E1122 Type 2 diabetes mellitus with diabetic chronic kidney disease: Secondary | ICD-10-CM | POA: Diagnosis not present

## 2021-08-25 DIAGNOSIS — Z992 Dependence on renal dialysis: Secondary | ICD-10-CM | POA: Diagnosis not present

## 2021-08-25 DIAGNOSIS — N2581 Secondary hyperparathyroidism of renal origin: Secondary | ICD-10-CM | POA: Diagnosis not present

## 2021-08-25 DIAGNOSIS — N186 End stage renal disease: Secondary | ICD-10-CM | POA: Diagnosis not present

## 2021-08-28 DIAGNOSIS — N186 End stage renal disease: Secondary | ICD-10-CM | POA: Diagnosis not present

## 2021-08-28 DIAGNOSIS — Z992 Dependence on renal dialysis: Secondary | ICD-10-CM | POA: Diagnosis not present

## 2021-08-28 DIAGNOSIS — N2581 Secondary hyperparathyroidism of renal origin: Secondary | ICD-10-CM | POA: Diagnosis not present

## 2021-08-30 DIAGNOSIS — Z992 Dependence on renal dialysis: Secondary | ICD-10-CM | POA: Diagnosis not present

## 2021-08-30 DIAGNOSIS — N2581 Secondary hyperparathyroidism of renal origin: Secondary | ICD-10-CM | POA: Diagnosis not present

## 2021-08-30 DIAGNOSIS — N186 End stage renal disease: Secondary | ICD-10-CM | POA: Diagnosis not present

## 2021-09-01 DIAGNOSIS — N2581 Secondary hyperparathyroidism of renal origin: Secondary | ICD-10-CM | POA: Diagnosis not present

## 2021-09-01 DIAGNOSIS — Z992 Dependence on renal dialysis: Secondary | ICD-10-CM | POA: Diagnosis not present

## 2021-09-01 DIAGNOSIS — N186 End stage renal disease: Secondary | ICD-10-CM | POA: Diagnosis not present

## 2021-09-04 DIAGNOSIS — Z992 Dependence on renal dialysis: Secondary | ICD-10-CM | POA: Diagnosis not present

## 2021-09-04 DIAGNOSIS — N186 End stage renal disease: Secondary | ICD-10-CM | POA: Diagnosis not present

## 2021-09-04 DIAGNOSIS — N2581 Secondary hyperparathyroidism of renal origin: Secondary | ICD-10-CM | POA: Diagnosis not present

## 2021-09-06 DIAGNOSIS — N2581 Secondary hyperparathyroidism of renal origin: Secondary | ICD-10-CM | POA: Diagnosis not present

## 2021-09-06 DIAGNOSIS — Z992 Dependence on renal dialysis: Secondary | ICD-10-CM | POA: Diagnosis not present

## 2021-09-06 DIAGNOSIS — N186 End stage renal disease: Secondary | ICD-10-CM | POA: Diagnosis not present

## 2021-09-08 DIAGNOSIS — Z992 Dependence on renal dialysis: Secondary | ICD-10-CM | POA: Diagnosis not present

## 2021-09-08 DIAGNOSIS — N2581 Secondary hyperparathyroidism of renal origin: Secondary | ICD-10-CM | POA: Diagnosis not present

## 2021-09-08 DIAGNOSIS — N186 End stage renal disease: Secondary | ICD-10-CM | POA: Diagnosis not present

## 2021-09-11 DIAGNOSIS — N186 End stage renal disease: Secondary | ICD-10-CM | POA: Diagnosis not present

## 2021-09-11 DIAGNOSIS — Z992 Dependence on renal dialysis: Secondary | ICD-10-CM | POA: Diagnosis not present

## 2021-09-11 DIAGNOSIS — N2581 Secondary hyperparathyroidism of renal origin: Secondary | ICD-10-CM | POA: Diagnosis not present

## 2021-09-12 DIAGNOSIS — N186 End stage renal disease: Secondary | ICD-10-CM | POA: Diagnosis not present

## 2021-09-12 DIAGNOSIS — I5042 Chronic combined systolic (congestive) and diastolic (congestive) heart failure: Secondary | ICD-10-CM | POA: Diagnosis not present

## 2021-09-12 DIAGNOSIS — I10 Essential (primary) hypertension: Secondary | ICD-10-CM | POA: Diagnosis not present

## 2021-09-12 DIAGNOSIS — E785 Hyperlipidemia, unspecified: Secondary | ICD-10-CM | POA: Diagnosis not present

## 2021-09-13 DIAGNOSIS — Z992 Dependence on renal dialysis: Secondary | ICD-10-CM | POA: Diagnosis not present

## 2021-09-13 DIAGNOSIS — N186 End stage renal disease: Secondary | ICD-10-CM | POA: Diagnosis not present

## 2021-09-13 DIAGNOSIS — N2581 Secondary hyperparathyroidism of renal origin: Secondary | ICD-10-CM | POA: Diagnosis not present

## 2021-09-15 DIAGNOSIS — Z992 Dependence on renal dialysis: Secondary | ICD-10-CM | POA: Diagnosis not present

## 2021-09-15 DIAGNOSIS — N186 End stage renal disease: Secondary | ICD-10-CM | POA: Diagnosis not present

## 2021-09-15 DIAGNOSIS — N2581 Secondary hyperparathyroidism of renal origin: Secondary | ICD-10-CM | POA: Diagnosis not present

## 2021-09-18 DIAGNOSIS — N2581 Secondary hyperparathyroidism of renal origin: Secondary | ICD-10-CM | POA: Diagnosis not present

## 2021-09-18 DIAGNOSIS — N186 End stage renal disease: Secondary | ICD-10-CM | POA: Diagnosis not present

## 2021-09-18 DIAGNOSIS — Z992 Dependence on renal dialysis: Secondary | ICD-10-CM | POA: Diagnosis not present

## 2021-09-20 DIAGNOSIS — N186 End stage renal disease: Secondary | ICD-10-CM | POA: Diagnosis not present

## 2021-09-20 DIAGNOSIS — Z992 Dependence on renal dialysis: Secondary | ICD-10-CM | POA: Diagnosis not present

## 2021-09-20 DIAGNOSIS — N2581 Secondary hyperparathyroidism of renal origin: Secondary | ICD-10-CM | POA: Diagnosis not present

## 2021-09-22 DIAGNOSIS — N2581 Secondary hyperparathyroidism of renal origin: Secondary | ICD-10-CM | POA: Diagnosis not present

## 2021-09-22 DIAGNOSIS — Z992 Dependence on renal dialysis: Secondary | ICD-10-CM | POA: Diagnosis not present

## 2021-09-22 DIAGNOSIS — N186 End stage renal disease: Secondary | ICD-10-CM | POA: Diagnosis not present

## 2021-09-23 DIAGNOSIS — E1122 Type 2 diabetes mellitus with diabetic chronic kidney disease: Secondary | ICD-10-CM | POA: Diagnosis not present

## 2021-09-23 DIAGNOSIS — N186 End stage renal disease: Secondary | ICD-10-CM | POA: Diagnosis not present

## 2021-09-23 DIAGNOSIS — Z992 Dependence on renal dialysis: Secondary | ICD-10-CM | POA: Diagnosis not present

## 2021-09-25 DIAGNOSIS — N186 End stage renal disease: Secondary | ICD-10-CM | POA: Diagnosis not present

## 2021-09-25 DIAGNOSIS — Z992 Dependence on renal dialysis: Secondary | ICD-10-CM | POA: Diagnosis not present

## 2021-09-25 DIAGNOSIS — N2581 Secondary hyperparathyroidism of renal origin: Secondary | ICD-10-CM | POA: Diagnosis not present

## 2021-09-27 DIAGNOSIS — Z992 Dependence on renal dialysis: Secondary | ICD-10-CM | POA: Diagnosis not present

## 2021-09-27 DIAGNOSIS — N186 End stage renal disease: Secondary | ICD-10-CM | POA: Diagnosis not present

## 2021-09-27 DIAGNOSIS — N2581 Secondary hyperparathyroidism of renal origin: Secondary | ICD-10-CM | POA: Diagnosis not present

## 2021-09-28 ENCOUNTER — Other Ambulatory Visit: Payer: Self-pay | Admitting: Internal Medicine

## 2021-09-28 DIAGNOSIS — R319 Hematuria, unspecified: Secondary | ICD-10-CM

## 2021-09-29 DIAGNOSIS — Z992 Dependence on renal dialysis: Secondary | ICD-10-CM | POA: Diagnosis not present

## 2021-09-29 DIAGNOSIS — N186 End stage renal disease: Secondary | ICD-10-CM | POA: Diagnosis not present

## 2021-09-29 DIAGNOSIS — N2581 Secondary hyperparathyroidism of renal origin: Secondary | ICD-10-CM | POA: Diagnosis not present

## 2021-10-02 DIAGNOSIS — N2581 Secondary hyperparathyroidism of renal origin: Secondary | ICD-10-CM | POA: Diagnosis not present

## 2021-10-02 DIAGNOSIS — N186 End stage renal disease: Secondary | ICD-10-CM | POA: Diagnosis not present

## 2021-10-02 DIAGNOSIS — Z992 Dependence on renal dialysis: Secondary | ICD-10-CM | POA: Diagnosis not present

## 2021-10-04 DIAGNOSIS — Z992 Dependence on renal dialysis: Secondary | ICD-10-CM | POA: Diagnosis not present

## 2021-10-04 DIAGNOSIS — N186 End stage renal disease: Secondary | ICD-10-CM | POA: Diagnosis not present

## 2021-10-04 DIAGNOSIS — N2581 Secondary hyperparathyroidism of renal origin: Secondary | ICD-10-CM | POA: Diagnosis not present

## 2021-10-06 DIAGNOSIS — N186 End stage renal disease: Secondary | ICD-10-CM | POA: Diagnosis not present

## 2021-10-06 DIAGNOSIS — Z992 Dependence on renal dialysis: Secondary | ICD-10-CM | POA: Diagnosis not present

## 2021-10-06 DIAGNOSIS — N2581 Secondary hyperparathyroidism of renal origin: Secondary | ICD-10-CM | POA: Diagnosis not present

## 2021-10-09 DIAGNOSIS — N2581 Secondary hyperparathyroidism of renal origin: Secondary | ICD-10-CM | POA: Diagnosis not present

## 2021-10-09 DIAGNOSIS — N186 End stage renal disease: Secondary | ICD-10-CM | POA: Diagnosis not present

## 2021-10-09 DIAGNOSIS — Z992 Dependence on renal dialysis: Secondary | ICD-10-CM | POA: Diagnosis not present

## 2021-10-10 ENCOUNTER — Ambulatory Visit
Admission: RE | Admit: 2021-10-10 | Discharge: 2021-10-10 | Disposition: A | Discharge status: Home / Self Care | Payer: Medicare HMO | Source: Ambulatory Visit | Attending: Internal Medicine | Admitting: Internal Medicine

## 2021-10-10 DIAGNOSIS — R319 Hematuria, unspecified: Secondary | ICD-10-CM

## 2021-10-11 DIAGNOSIS — Z992 Dependence on renal dialysis: Secondary | ICD-10-CM | POA: Diagnosis not present

## 2021-10-11 DIAGNOSIS — N186 End stage renal disease: Secondary | ICD-10-CM | POA: Diagnosis not present

## 2021-10-11 DIAGNOSIS — N2581 Secondary hyperparathyroidism of renal origin: Secondary | ICD-10-CM | POA: Diagnosis not present

## 2021-10-13 DIAGNOSIS — N2581 Secondary hyperparathyroidism of renal origin: Secondary | ICD-10-CM | POA: Diagnosis not present

## 2021-10-13 DIAGNOSIS — N186 End stage renal disease: Secondary | ICD-10-CM | POA: Diagnosis not present

## 2021-10-13 DIAGNOSIS — Z992 Dependence on renal dialysis: Secondary | ICD-10-CM | POA: Diagnosis not present

## 2021-10-16 DIAGNOSIS — Z992 Dependence on renal dialysis: Secondary | ICD-10-CM | POA: Diagnosis not present

## 2021-10-16 DIAGNOSIS — N186 End stage renal disease: Secondary | ICD-10-CM | POA: Diagnosis not present

## 2021-10-16 DIAGNOSIS — N2581 Secondary hyperparathyroidism of renal origin: Secondary | ICD-10-CM | POA: Diagnosis not present

## 2021-10-18 DIAGNOSIS — N2581 Secondary hyperparathyroidism of renal origin: Secondary | ICD-10-CM | POA: Diagnosis not present

## 2021-10-18 DIAGNOSIS — Z992 Dependence on renal dialysis: Secondary | ICD-10-CM | POA: Diagnosis not present

## 2021-10-18 DIAGNOSIS — N186 End stage renal disease: Secondary | ICD-10-CM | POA: Diagnosis not present

## 2021-10-19 ENCOUNTER — Ambulatory Visit (INDEPENDENT_AMBULATORY_CARE_PROVIDER_SITE_OTHER): Payer: Medicare HMO | Admitting: Gastroenterology

## 2021-10-19 ENCOUNTER — Encounter: Payer: Self-pay | Admitting: Gastroenterology

## 2021-10-19 ENCOUNTER — Other Ambulatory Visit (INDEPENDENT_AMBULATORY_CARE_PROVIDER_SITE_OTHER): Payer: Medicare HMO

## 2021-10-19 DIAGNOSIS — K625 Hemorrhage of anus and rectum: Secondary | ICD-10-CM

## 2021-10-19 DIAGNOSIS — K5900 Constipation, unspecified: Secondary | ICD-10-CM

## 2021-10-19 DIAGNOSIS — Z800 Family history of malignant neoplasm of digestive organs: Secondary | ICD-10-CM | POA: Diagnosis not present

## 2021-10-19 LAB — CBC
HCT: 42.8 % (ref 39.0–52.0)
Hemoglobin: 13.7 g/dL (ref 13.0–17.0)
MCHC: 32 g/dL (ref 30.0–36.0)
MCV: 92.4 fl (ref 78.0–100.0)
Platelets: 234 10*3/uL (ref 150.0–400.0)
RBC: 4.63 Mil/uL (ref 4.22–5.81)
RDW: 18.9 % — ABNORMAL HIGH (ref 11.5–15.5)
WBC: 7.3 10*3/uL (ref 4.0–10.5)

## 2021-10-19 MED ORDER — CITRUCEL PO POWD: 1.0000 | Freq: Every day | ORAL | 0 refills | Status: DC

## 2021-10-19 NOTE — Patient Instructions (Addendum)
If you are age 60 or younger, your body mass index should be between 19-25. Your Body mass index is 45.13 kg/m. If this is out of the aformentioned range listed, please consider follow up with your Primary Care Provider.  ________________________________________________________  The [facility] providers would like to encourage you to use [facility] to communicate with providers for non-urgent requests or questions.  Due to long hold times on the telephone, sending your provider a message by [facility] may be a faster and more efficient way to get a response.  Please allow 48 business hours for a response.  Please remember that this is for non-urgent requests.  _______________________________________________________  Your provider has requested that you go to the basement level for lab work before leaving today. Press "B" on the elevator. The lab is located at the first door on the left as you exit the elevator.  Due to recent changes in healthcare laws, you may see the results of your imaging and laboratory studies on MyChart before your provider has had a chance to review them.  We understand that in some cases there may be results that are confusing or concerning to you. Not all laboratory results come back in the same time frame and the provider may be waiting for multiple results in order to interpret others.  Please give [place] 48 hours in order for your provider to thoroughly review all the results before contacting the office for clarification of your results.    Please start taking citrucel (orange flavored) powder fiber supplement.  This may cause some bloating at first but that usually goes away. Begin with a small spoonful and work your way up to a large, heaping spoonful daily over a week.  You will need a colonoscopy at [facility].  We do not have our schedule available at this time.  We will contact you when the August schedule becomes available.  Thank you for entrusting me with your  care and choosing [facility].  Dr Ardis Hughs

## 2021-10-19 NOTE — Progress Notes (Signed)
HPI: This is a very pleasant 61 year old man who was referred to me by Charlott Rakes, MD  to evaluate rectal bleeding  He has a gastroenterologist [facility] gastroenterology.  He is not sure why he was referred here.  He is on hemodialysis 3 times a week.  He has morbid obesity.  He has seen a small amount of bright red blood per rectum with most bowel movements over the past 3 months.  He tends to have to really push and strain to move his bowels.  He says it is just a small amount of blood.  He also has very dark-colored stools.  He has no abdominal pain.  His weight is overall stable.  His sister was diagnosed with colon cancer in her 22s.   Old Data Reviewed:  He underwent a colonoscopy 10/2017 with Dr. Arta Silence at [facility] gastroenterology for routine risk colon cancer screening: 2 subcentimeter polyps were found.  We do not have access to those pathology records because [facility] does not share their pathology results with the epic system.  Echocardiogram 06/2019 left ventricular ejection fraction 60 to 65%  Review of systems: Pertinent positive and negative review of systems were noted in the above HPI section. All other review negative.   Past Medical History:  Diagnosis Date   Arthritis    "back" (02/06/2017)   CHF (congestive heart failure) ([place])    new onset /Encounter Date 09/12/2006   Chronic combined systolic and diastolic heart failure (HCC)    CKD (chronic kidney disease) stage 4, GFR 15-29 ml/min (HCC)    COPD (chronic obstructive pulmonary disease) (HCC)    End stage renal disease on dialysis [facility])    Tues/ Thur/ Sat [place]   Glaucoma, right eye    High cholesterol    Hypertension    Hypertrophy of tonsils alone    Myocardial infarction ([place])    "small one; ~ 2008" (02/06/2017)   Obesity, unspecified    On home oxygen therapy    "4L; 24/7" (04/10/2017)   OSA on CPAP    Type II diabetes mellitus ([place])     Past Surgical History:  Procedure  Laterality Date   AV FISTULA PLACEMENT Right 07/01/2019   Procedure: Right arm arteriovenous fistual;  Surgeon: Waynetta Sandy, MD;  Location: [place];  Service: Vascular;  Laterality: Right;   FISTULA SUPERFICIALIZATION Right 09/01/2019   Procedure: RIGHT UPPER ARM FISTULA  TRANSPOSITION OF RIGHT UPPPER ARM BRACHIAL CEPHALIC FISTULA;  Surgeon: Waynetta Sandy, MD;  Location: [place];  Service: Vascular;  Laterality: Right;   HERNIA REPAIR     INSERTION OF DIALYSIS CATHETER Right 07/01/2019   Procedure: INSERTION OF TUNNELED DIALYSIS CATHETER;  Surgeon: Waynetta Sandy, MD;  Location: [place];  Service: Vascular;  Laterality: Right;   TEE WITHOUT CARDIOVERSION N/A 04/16/2017   Procedure: TRANSESOPHAGEAL ECHOCARDIOGRAM (TEE);  Surgeon: [name], MD;  Location: [facility] ENDOSCOPY;  Service: Cardiovascular;  Laterality: N/A;   [place]   "when I was little baby"    Current Outpatient Medications  Medication Instructions   ACCU-CHEK SOFTCLIX LANCETS lancets Use as instructed to check blood sugar up to 3 times daily.   amLODipine (NORVASC) 5 MG tablet TAKE 1 TABLET(5 MG) BY MOUTH DAILY   aspirin EC 81 mg, Oral, Daily   atorvastatin (LIPITOR) 40 MG tablet TAKE 1 TABLET(40 MG) BY MOUTH DAILY   Blood Glucose Monitoring Suppl (ACCU-CHEK AVIVA) device Use as instructed to check blood  sugar up to 3 times daily.   calcium carbonate (TUMS - DOSED IN MG ELEMENTAL CALCIUM) 500 MG chewable tablet 200 mg of elemental calcium, Oral, 2 times daily   Doxercalciferol (HECTOROL IV) Doxercalciferol (Hectorol)   FEROSUL 325 (65 Fe) MG tablet TAKE 1 TABLET(325 MG) BY MOUTH DAILY WITH BREAKFAST   glucose blood (ACCU-CHEK AVIVA PLUS) test strip Use as instructed to check blood sugar up to 3 times daily.   Lancet Devices (ACCU-CHEK SOFTCLIX) lancets Use as instructed daily.   Multiple Vitamin (MULTIVITAMIN ADULT) TABS 1 tablet, Oral, Daily   nitroGLYCERIN (NITROSTAT) 0.4 MG  SL tablet PLACE 1 TABLET BY MOUTH UNDER TONGUE EVERY 5 MINUTES FOR 3 DOSES AS NEEDED FOR CHEST PAIN   timolol (TIMOPTIC-XR) 0.5 % ophthalmic gel-forming 1 drop, Both Eyes, Daily   TRAVATAN Z 0.004 % SOLN ophthalmic solution 1 drop, Both Eyes, Daily at bedtime   Trulicity 1.5 mg, Subcutaneous, Weekly    Allergies as of 10/19/2021   (No Known Allergies)    Family History  Problem Relation Age of Onset   Hypertension Mother    Diabetes Sister    Colon cancer Sister    Diabetes Brother    Cancer Maternal Aunt    Amblyopia Neg Hx    Blindness Neg Hx    Cataracts Neg Hx    Glaucoma Neg Hx    Macular degeneration Neg Hx    Strabismus Neg Hx    Retinal detachment Neg Hx    Retinitis pigmentosa Neg Hx     Social History   Socioeconomic History   Marital status: Single    Spouse name: Not on file   Number of children: 0   Years of education: Not on file   Highest education level: Not on file  Occupational History   Occupation: works as a Training and development officer  Tobacco Use   Smoking status: Never   Smokeless tobacco: Never  Scientific laboratory technician Use: Never used  Substance and Sexual Activity   Alcohol use: Yes    Comment: occasional   Drug use: No   Sexual activity: Not Currently  Other Topics Concern   Not on file  Social History Narrative   Not on file   Social Determinants of Health   Financial Resource Strain: Not on file  Food Insecurity: Not on file  Transportation Needs: Not on file  Physical Activity: Not on file  Stress: Not on file  Social Connections: Not on file  Intimate Partner Violence: Not on file     Physical Exam: BP 118/68   Pulse 72   Ht '5\' 11"'$  (1.803 m)   Wt (!) 323 lb 9.6 oz (146.8 kg)   BMI 45.13 kg/m  Constitutional: generally well-appearing Psychiatric: alert and oriented x3 Eyes: extraocular movements intact Mouth: oral pharynx moist, no lesions Neck: supple no lymphadenopathy Cardiovascular: heart regular rate and rhythm Lungs: clear to  auscultation bilaterally Abdomen: soft, nontender, nondistended, no obvious ascites, no peritoneal signs, normal bowel sounds Extremities: no lower extremity edema bilaterally Skin: no lesions on visible extremities   Assessment and plan: 61 y.o. male with morbid obesity, end-stage renal failure on dialysis, chronic constipation, family history of colon cancer, personal history of colon polyps, rectal bleeding  I recommended colonoscopy at his soonest convenience.  This will have to be done in the hospital setting given his dialysis and his morbid obesity.  He understands that there is a "critical staff shortage" in the hospital and so we will probably be  2 or 3 months before we can get to this.  I think his rectal bleeding is likely hemorrhoidal, probably due to his chronic constipation.  He will start taking fiber supplements with Citrucel on a daily basis.  We will also get a CBC to make sure he is not significantly anemic.   Please see the "Patient Instructions" section for addition details about the plan.   Owens Loffler, MD [facility] 10/19/2021, 9:54 AM  Cc: Charlott Rakes, MD  Total time on date of encounter was 47  minutes (this included time spent preparing to see the patient reviewing records; obtaining and/or reviewing separately obtained history; performing a medically appropriate exam and/or evaluation; counseling and educating the patient and family if present; ordering medications, tests or procedures if applicable; and documenting clinical information in the health record).

## 2021-10-20 DIAGNOSIS — N2581 Secondary hyperparathyroidism of renal origin: Secondary | ICD-10-CM | POA: Diagnosis not present

## 2021-10-20 DIAGNOSIS — Z992 Dependence on renal dialysis: Secondary | ICD-10-CM | POA: Diagnosis not present

## 2021-10-20 DIAGNOSIS — N186 End stage renal disease: Secondary | ICD-10-CM | POA: Diagnosis not present

## 2021-10-23 DIAGNOSIS — N186 End stage renal disease: Secondary | ICD-10-CM | POA: Diagnosis not present

## 2021-10-23 DIAGNOSIS — N2581 Secondary hyperparathyroidism of renal origin: Secondary | ICD-10-CM | POA: Diagnosis not present

## 2021-10-23 DIAGNOSIS — Z992 Dependence on renal dialysis: Secondary | ICD-10-CM | POA: Diagnosis not present

## 2021-10-24 DIAGNOSIS — Z992 Dependence on renal dialysis: Secondary | ICD-10-CM | POA: Diagnosis not present

## 2021-10-24 DIAGNOSIS — E1122 Type 2 diabetes mellitus with diabetic chronic kidney disease: Secondary | ICD-10-CM | POA: Diagnosis not present

## 2021-10-24 DIAGNOSIS — N186 End stage renal disease: Secondary | ICD-10-CM | POA: Diagnosis not present

## 2021-10-25 DIAGNOSIS — N186 End stage renal disease: Secondary | ICD-10-CM | POA: Diagnosis not present

## 2021-10-25 DIAGNOSIS — Z992 Dependence on renal dialysis: Secondary | ICD-10-CM | POA: Diagnosis not present

## 2021-10-25 DIAGNOSIS — N2581 Secondary hyperparathyroidism of renal origin: Secondary | ICD-10-CM | POA: Diagnosis not present

## 2021-10-26 ENCOUNTER — Other Ambulatory Visit: Payer: Self-pay

## 2021-10-26 DIAGNOSIS — Z800 Family history of malignant neoplasm of digestive organs: Secondary | ICD-10-CM

## 2021-10-26 DIAGNOSIS — K625 Hemorrhage of anus and rectum: Secondary | ICD-10-CM

## 2021-10-26 DIAGNOSIS — K5900 Constipation, unspecified: Secondary | ICD-10-CM

## 2021-10-26 MED ORDER — NA SULFATE-K SULFATE-MG SULF 17.5-3.13-1.6 GM/177ML PO SOLN: 1.0000 | ORAL | 0 refills | Status: DC

## 2021-10-27 DIAGNOSIS — N186 End stage renal disease: Secondary | ICD-10-CM | POA: Diagnosis not present

## 2021-10-27 DIAGNOSIS — Z992 Dependence on renal dialysis: Secondary | ICD-10-CM | POA: Diagnosis not present

## 2021-10-27 DIAGNOSIS — N2581 Secondary hyperparathyroidism of renal origin: Secondary | ICD-10-CM | POA: Diagnosis not present

## 2021-10-30 DIAGNOSIS — N2581 Secondary hyperparathyroidism of renal origin: Secondary | ICD-10-CM | POA: Diagnosis not present

## 2021-10-30 DIAGNOSIS — N186 End stage renal disease: Secondary | ICD-10-CM | POA: Diagnosis not present

## 2021-10-30 DIAGNOSIS — Z992 Dependence on renal dialysis: Secondary | ICD-10-CM | POA: Diagnosis not present

## 2021-11-01 DIAGNOSIS — N186 End stage renal disease: Secondary | ICD-10-CM | POA: Diagnosis not present

## 2021-11-01 DIAGNOSIS — Z992 Dependence on renal dialysis: Secondary | ICD-10-CM | POA: Diagnosis not present

## 2021-11-01 DIAGNOSIS — N2581 Secondary hyperparathyroidism of renal origin: Secondary | ICD-10-CM | POA: Diagnosis not present

## 2021-11-03 DIAGNOSIS — Z992 Dependence on renal dialysis: Secondary | ICD-10-CM | POA: Diagnosis not present

## 2021-11-03 DIAGNOSIS — N186 End stage renal disease: Secondary | ICD-10-CM | POA: Diagnosis not present

## 2021-11-03 DIAGNOSIS — N2581 Secondary hyperparathyroidism of renal origin: Secondary | ICD-10-CM | POA: Diagnosis not present

## 2021-11-06 DIAGNOSIS — N2581 Secondary hyperparathyroidism of renal origin: Secondary | ICD-10-CM | POA: Diagnosis not present

## 2021-11-06 DIAGNOSIS — Z992 Dependence on renal dialysis: Secondary | ICD-10-CM | POA: Diagnosis not present

## 2021-11-06 DIAGNOSIS — N186 End stage renal disease: Secondary | ICD-10-CM | POA: Diagnosis not present

## 2021-11-08 DIAGNOSIS — N2581 Secondary hyperparathyroidism of renal origin: Secondary | ICD-10-CM | POA: Diagnosis not present

## 2021-11-08 DIAGNOSIS — N186 End stage renal disease: Secondary | ICD-10-CM | POA: Diagnosis not present

## 2021-11-08 DIAGNOSIS — Z992 Dependence on renal dialysis: Secondary | ICD-10-CM | POA: Diagnosis not present

## 2021-11-09 ENCOUNTER — Other Ambulatory Visit: Payer: Self-pay

## 2021-11-09 ENCOUNTER — Encounter (HOSPITAL_COMMUNITY): Payer: Self-pay | Admitting: Emergency Medicine

## 2021-11-09 ENCOUNTER — Emergency Department (HOSPITAL_COMMUNITY): Payer: Medicare HMO

## 2021-11-09 ENCOUNTER — Observation Stay (HOSPITAL_COMMUNITY)
Admission: EM | Admit: 2021-11-09 | Discharge: 2021-11-13 | Disposition: A | Discharge status: Home / Self Care | Payer: Medicare HMO | Attending: Internal Medicine | Admitting: Internal Medicine

## 2021-11-09 DIAGNOSIS — J449 Chronic obstructive pulmonary disease, unspecified: Secondary | ICD-10-CM | POA: Insufficient documentation

## 2021-11-09 DIAGNOSIS — Z20822 Contact with and (suspected) exposure to covid-19: Secondary | ICD-10-CM | POA: Insufficient documentation

## 2021-11-09 DIAGNOSIS — E1169 Type 2 diabetes mellitus with other specified complication: Secondary | ICD-10-CM | POA: Diagnosis present

## 2021-11-09 DIAGNOSIS — I10 Essential (primary) hypertension: Secondary | ICD-10-CM | POA: Diagnosis present

## 2021-11-09 DIAGNOSIS — E1122 Type 2 diabetes mellitus with diabetic chronic kidney disease: Secondary | ICD-10-CM | POA: Diagnosis not present

## 2021-11-09 DIAGNOSIS — I5042 Chronic combined systolic (congestive) and diastolic (congestive) heart failure: Secondary | ICD-10-CM | POA: Insufficient documentation

## 2021-11-09 DIAGNOSIS — R778 Other specified abnormalities of plasma proteins: Secondary | ICD-10-CM | POA: Insufficient documentation

## 2021-11-09 DIAGNOSIS — E041 Nontoxic single thyroid nodule: Secondary | ICD-10-CM | POA: Diagnosis not present

## 2021-11-09 DIAGNOSIS — R0602 Shortness of breath: Secondary | ICD-10-CM | POA: Diagnosis not present

## 2021-11-09 DIAGNOSIS — Z7985 Long-term (current) use of injectable non-insulin antidiabetic drugs: Secondary | ICD-10-CM | POA: Diagnosis not present

## 2021-11-09 DIAGNOSIS — I132 Hypertensive heart and chronic kidney disease with heart failure and with stage 5 chronic kidney disease, or end stage renal disease: Secondary | ICD-10-CM | POA: Diagnosis not present

## 2021-11-09 DIAGNOSIS — R0789 Other chest pain: Secondary | ICD-10-CM | POA: Diagnosis not present

## 2021-11-09 DIAGNOSIS — D638 Anemia in other chronic diseases classified elsewhere: Secondary | ICD-10-CM

## 2021-11-09 DIAGNOSIS — N186 End stage renal disease: Secondary | ICD-10-CM | POA: Diagnosis not present

## 2021-11-09 DIAGNOSIS — Z7982 Long term (current) use of aspirin: Secondary | ICD-10-CM | POA: Diagnosis not present

## 2021-11-09 DIAGNOSIS — E669 Obesity, unspecified: Secondary | ICD-10-CM

## 2021-11-09 DIAGNOSIS — Z79899 Other long term (current) drug therapy: Secondary | ICD-10-CM | POA: Insufficient documentation

## 2021-11-09 DIAGNOSIS — J9811 Atelectasis: Secondary | ICD-10-CM | POA: Diagnosis not present

## 2021-11-09 DIAGNOSIS — D631 Anemia in chronic kidney disease: Secondary | ICD-10-CM | POA: Diagnosis not present

## 2021-11-09 DIAGNOSIS — R079 Chest pain, unspecified: Secondary | ICD-10-CM

## 2021-11-09 DIAGNOSIS — Z992 Dependence on renal dialysis: Secondary | ICD-10-CM | POA: Diagnosis not present

## 2021-11-09 DIAGNOSIS — G4733 Obstructive sleep apnea (adult) (pediatric): Secondary | ICD-10-CM | POA: Diagnosis present

## 2021-11-09 LAB — BASIC METABOLIC PANEL
Anion gap: 14 (ref 5–15)
BUN: 27 mg/dL — ABNORMAL HIGH (ref 6–20)
CO2: 29 mmol/L (ref 22–32)
Calcium: 9.1 mg/dL (ref 8.9–10.3)
Chloride: 93 mmol/L — ABNORMAL LOW (ref 98–111)
Creatinine, Ser: 8.7 mg/dL — ABNORMAL HIGH (ref 0.61–1.24)
GFR, Estimated: 6 mL/min — ABNORMAL LOW (ref >60)
Glucose, Bld: 90 mg/dL (ref 70–99)
Potassium: 4.4 mmol/L (ref 3.5–5.1)
Sodium: 136 mmol/L (ref 135–145)

## 2021-11-09 LAB — CBC
HCT: 39.4 % (ref 39.0–52.0)
Hemoglobin: 12.2 g/dL — ABNORMAL LOW (ref 13.0–17.0)
MCH: 29.3 pg (ref 26.0–34.0)
MCHC: 31 g/dL (ref 30.0–36.0)
MCV: 94.7 fL (ref 80.0–100.0)
Platelets: 248 10*3/uL (ref 150–400)
RBC: 4.16 MIL/uL — ABNORMAL LOW (ref 4.22–5.81)
RDW: 16.6 % — ABNORMAL HIGH (ref 11.5–15.5)
WBC: 9.4 10*3/uL (ref 4.0–10.5)
nRBC: 0 % (ref 0.0–0.2)

## 2021-11-09 LAB — TROPONIN I (HIGH SENSITIVITY)
Troponin I (High Sensitivity): 40 ng/L — ABNORMAL HIGH (ref <18)
Troponin I (High Sensitivity): 43 ng/L — ABNORMAL HIGH (ref <18)

## 2021-11-09 MED ORDER — ASPIRIN 81 MG PO CHEW
324.0000 mg | CHEWABLE_TABLET | ORAL | Status: AC
  Administered 2021-11-09: 324 mg via ORAL
  Filled 2021-11-09: qty 4

## 2021-11-09 NOTE — ED Provider Triage Note (Signed)
Emergency Medicine Provider Triage Evaluation Note  Adam Dennis , a 61 y.o. male  was evaluated in triage.  Pt complains of intermittent left sided chest pain onset today. Denies history of stents. No meds tried PTA.  Denies shortness of breath, nausea, vomiting.  Also with pain to the face, believes that his CPAP machine may be too tight.  Patient does not wear oxygen at home.  Review of Systems  Positive: As per HPI above Negative:   Physical Exam  BP (!) 144/80   Pulse 100   Temp 98.4 F (36.9 C) (Oral)   Resp 16   SpO2 96%  Gen:   Awake, no distress   Resp:  Normal effort  MSK:   Moves extremities without difficulty  Other:  No chest wall tenderness to palpation  Medical Decision Making  Medically screening exam initiated at 6:13 PM.  Appropriate orders placed.  Boris Sharper was informed that the remainder of the evaluation will be completed by another provider, this initial triage assessment does not replace that evaluation, and the importance of remaining in the ED until their evaluation is complete.  6:22 PM - Discussed with RN that patient is in need of a room due to hypoxia in ED. RN aware and working on room placement. Work-up initiated.   Jimmye Wisnieski A, PA-C 11/09/21 1824

## 2021-11-09 NOTE — ED Triage Notes (Signed)
Patient complains of intermittent chest pain and face pain that started last night. Wears a CPAP, patient states he thinks maybe his mask was too tight. Dialysis access in right arm, go to treatment Tues, Thurs, Sat. Patient is alert, oriented, speaking in complete sentences, and is in no apparent distress at this time.

## 2021-11-10 ENCOUNTER — Emergency Department (HOSPITAL_COMMUNITY): Payer: Medicare HMO

## 2021-11-10 ENCOUNTER — Other Ambulatory Visit: Payer: Self-pay

## 2021-11-10 ENCOUNTER — Observation Stay (HOSPITAL_COMMUNITY): Payer: Medicare HMO

## 2021-11-10 ENCOUNTER — Encounter (HOSPITAL_COMMUNITY): Payer: Self-pay | Admitting: Emergency Medicine

## 2021-11-10 DIAGNOSIS — N186 End stage renal disease: Secondary | ICD-10-CM

## 2021-11-10 DIAGNOSIS — D638 Anemia in other chronic diseases classified elsewhere: Secondary | ICD-10-CM

## 2021-11-10 DIAGNOSIS — R079 Chest pain, unspecified: Secondary | ICD-10-CM

## 2021-11-10 DIAGNOSIS — R0789 Other chest pain: Secondary | ICD-10-CM | POA: Diagnosis not present

## 2021-11-10 DIAGNOSIS — E119 Type 2 diabetes mellitus without complications: Secondary | ICD-10-CM | POA: Diagnosis not present

## 2021-11-10 DIAGNOSIS — E7849 Other hyperlipidemia: Secondary | ICD-10-CM | POA: Diagnosis not present

## 2021-11-10 DIAGNOSIS — I10 Essential (primary) hypertension: Secondary | ICD-10-CM | POA: Diagnosis not present

## 2021-11-10 DIAGNOSIS — R0602 Shortness of breath: Secondary | ICD-10-CM | POA: Diagnosis not present

## 2021-11-10 DIAGNOSIS — I25110 Atherosclerotic heart disease of native coronary artery with unstable angina pectoris: Secondary | ICD-10-CM | POA: Diagnosis not present

## 2021-11-10 LAB — CBC
HCT: 36.8 % — ABNORMAL LOW (ref 39.0–52.0)
Hemoglobin: 11.6 g/dL — ABNORMAL LOW (ref 13.0–17.0)
MCH: 29.5 pg (ref 26.0–34.0)
MCHC: 31.5 g/dL (ref 30.0–36.0)
MCV: 93.6 fL (ref 80.0–100.0)
Platelets: 246 10*3/uL (ref 150–400)
RBC: 3.93 MIL/uL — ABNORMAL LOW (ref 4.22–5.81)
RDW: 16.3 % — ABNORMAL HIGH (ref 11.5–15.5)
WBC: 7.4 10*3/uL (ref 4.0–10.5)
nRBC: 0 % (ref 0.0–0.2)

## 2021-11-10 LAB — RENAL FUNCTION PANEL
Albumin: 3 g/dL — ABNORMAL LOW (ref 3.5–5.0)
Anion gap: 16 — ABNORMAL HIGH (ref 5–15)
BUN: 30 mg/dL — ABNORMAL HIGH (ref 6–20)
CO2: 26 mmol/L (ref 22–32)
Calcium: 9.3 mg/dL (ref 8.9–10.3)
Chloride: 93 mmol/L — ABNORMAL LOW (ref 98–111)
Creatinine, Ser: 9.81 mg/dL — ABNORMAL HIGH (ref 0.61–1.24)
GFR, Estimated: 6 mL/min — ABNORMAL LOW (ref >60)
Glucose, Bld: 96 mg/dL (ref 70–99)
Phosphorus: 7.2 mg/dL — ABNORMAL HIGH (ref 2.5–4.6)
Potassium: 4.5 mmol/L (ref 3.5–5.1)
Sodium: 135 mmol/L (ref 135–145)

## 2021-11-10 LAB — CREATININE, SERUM
Creatinine, Ser: 9.49 mg/dL — ABNORMAL HIGH (ref 0.61–1.24)
GFR, Estimated: 6 mL/min — ABNORMAL LOW (ref >60)

## 2021-11-10 LAB — HEPATITIS C ANTIBODY: HCV Ab: NONREACTIVE

## 2021-11-10 LAB — HEPATITIS B SURFACE ANTIGEN: Hepatitis B Surface Ag: NONREACTIVE

## 2021-11-10 LAB — TROPONIN I (HIGH SENSITIVITY)
Troponin I (High Sensitivity): 44 ng/L — ABNORMAL HIGH (ref <18)
Troponin I (High Sensitivity): 41 ng/L — ABNORMAL HIGH (ref <18)

## 2021-11-10 LAB — SARS CORONAVIRUS 2 BY RT PCR: SARS Coronavirus 2 by RT PCR: NEGATIVE

## 2021-11-10 LAB — HEPATITIS B CORE ANTIBODY, TOTAL: Hep B Core Total Ab: NONREACTIVE

## 2021-11-10 LAB — HIV ANTIBODY (ROUTINE TESTING W REFLEX): HIV Screen 4th Generation wRfx: NONREACTIVE

## 2021-11-10 LAB — HEPATITIS B SURFACE ANTIBODY,QUALITATIVE: Hep B S Ab: REACTIVE — AB

## 2021-11-10 LAB — MRSA NEXT GEN BY PCR, NASAL: MRSA by PCR Next Gen: NOT DETECTED

## 2021-11-10 MED ORDER — HEPARIN SODIUM (PORCINE) 1000 UNIT/ML DIALYSIS
10000.0000 [IU] | Freq: Once | INTRAMUSCULAR | Status: AC
  Administered 2021-11-10: 10000 [IU] via INTRAVENOUS_CENTRAL
  Filled 2021-11-10 (×2): qty 10

## 2021-11-10 MED ORDER — ATORVASTATIN CALCIUM 40 MG PO TABS
40.0000 mg | ORAL_TABLET | Freq: Every day | ORAL | Status: DC
  Administered 2021-11-10 – 2021-11-13 (×4): 40 mg via ORAL
  Filled 2021-11-10 (×4): qty 1

## 2021-11-10 MED ORDER — TIMOLOL MALEATE 0.5 % OP SOLN
1.0000 [drp] | Freq: Every day | OPHTHALMIC | Status: DC
Start: 2021-11-10 — End: 2021-11-14
  Administered 2021-11-12 – 2021-11-13 (×2): 1 [drp] via OPHTHALMIC

## 2021-11-10 MED ORDER — LATANOPROST 0.005 % OP SOLN
1.0000 [drp] | Freq: Every day | OPHTHALMIC | Status: DC
  Administered 2021-11-10 – 2021-11-12 (×3): 1 [drp] via OPHTHALMIC
  Filled 2021-11-10: qty 2.5

## 2021-11-10 MED ORDER — SEVELAMER CARBONATE 800 MG PO TABS
800.0000 mg | ORAL_TABLET | Freq: Three times a day (TID) | ORAL | Status: DC
  Administered 2021-11-10 – 2021-11-12 (×5): 800 mg via ORAL
  Filled 2021-11-10 (×5): qty 1

## 2021-11-10 MED ORDER — IOHEXOL 350 MG/ML SOLN
59.0000 mL | Freq: Once | INTRAVENOUS | Status: AC | PRN
  Administered 2021-11-10: 59 mL via INTRAVENOUS

## 2021-11-10 MED ORDER — AMLODIPINE BESYLATE 5 MG PO TABS
5.0000 mg | ORAL_TABLET | Freq: Every day | ORAL | Status: DC
  Administered 2021-11-11 – 2021-11-13 (×3): 5 mg via ORAL
  Filled 2021-11-10 (×4): qty 1

## 2021-11-10 MED ORDER — CHLORHEXIDINE GLUCONATE CLOTH 2 % EX PADS
6.0000 | MEDICATED_PAD | Freq: Every day | CUTANEOUS | Status: DC
  Administered 2021-11-11 – 2021-11-12 (×2): 6 via TOPICAL

## 2021-11-10 MED ORDER — PENTAFLUOROPROP-TETRAFLUOROETH EX AERO: 1.0000 | INHALATION_SPRAY | CUTANEOUS | Status: DC | PRN

## 2021-11-10 MED ORDER — HYDROMORPHONE HCL 1 MG/ML IJ SOLN: 0.5000 mg | INTRAMUSCULAR | Status: DC | PRN

## 2021-11-10 MED ORDER — IOHEXOL 350 MG/ML SOLN
75.0000 mL | Freq: Once | INTRAVENOUS | Status: AC | PRN
  Administered 2021-11-10: 75 mL via INTRAVENOUS

## 2021-11-10 MED ORDER — ASPIRIN 81 MG PO TBEC
81.0000 mg | DELAYED_RELEASE_TABLET | Freq: Every day | ORAL | Status: DC
  Administered 2021-11-10 – 2021-11-13 (×4): 81 mg via ORAL
  Filled 2021-11-10 (×4): qty 1

## 2021-11-10 MED ORDER — HEPARIN SODIUM (PORCINE) 5000 UNIT/ML IJ SOLN
5000.0000 [IU] | Freq: Three times a day (TID) | INTRAMUSCULAR | Status: DC
  Administered 2021-11-10 – 2021-11-13 (×10): 5000 [IU] via SUBCUTANEOUS
  Filled 2021-11-10 (×9): qty 1

## 2021-11-10 MED ORDER — NALOXONE HCL 0.4 MG/ML IJ SOLN: 0.4000 mg | INTRAMUSCULAR | Status: DC | PRN

## 2021-11-10 NOTE — Discharge Summary (Signed)
Physician Discharge Summary  Adam Dennis:[id number] DOB: [birth year] DOA: 11/09/2021  PCP: Charlott Rakes, MD  Admit date: 11/09/2021 Discharge date: 11/13/2021  Admitted From: Home Disposition: Home  Recommendations for Outpatient Follow-up:  Follow up with PCP in 1 week  Outpatient follow-up with cardiology Follow-up with hemodialysis unit as scheduled Follow up in ED if symptoms worsen or new appear   Home Health: No Equipment/Devices: None  Discharge Condition: Stable CODE STATUS: Full Diet recommendation: Heart healthy with fluid restriction of up to 1200 cc a day/carb modified  Brief/Interim Summary:  61 y.o. male with medical history significant for ESRD HD TTS, prior MI, essential hypertension, hyperlipidemia, type 2 diabetes presented with chest pain.  On presentation, CTA of the chest was negative for PE.  High-sensitivity troponins were 40 and then 43.  Nephrology and cardiology were consulted.  He underwent hemodialysis during the hospitalization.  He is currently chest pain-free.  Stress test was low risk. He has been cleared for discharge home by cardiology team.  He will be discharged home today.  Discharge Diagnoses:   Chest pain: Present on admission Mildly positive troponins: Did not trend upwards -Troponins did not trend upward significantly.  Currently chest pain-free.  Continue aspirin and Lipitor.   -Stress test was low risk. Cardiology has cleared the patient for discharge.  Outpatient follow-up with cardiology.  End-stage renal disease on hemodialysis -Patient will have hemodialysis today prior to discharge.  Nephrology evaluation appreciated.  Outpatient follow-up with dialysis unit as scheduled  Essential hypertension -Continue home regimen.  Outpatient follow-up  Hyperlipidemia -Continue home statin  Diabetes mellitus type 2 -Carb modified diet.  Continue home regimen.  Outpatient follow-up  Anemia of chronic disease -From renal failure.   Hemoglobin stable.  Outpatient follow-up  Morbid obesity Obstructive sleep apnea -Outpatient follow-up    Discharge Instructions   Allergies as of 11/13/2021   No Known Allergies      Medication List     STOP taking these medications    Na Sulfate-K Sulfate-Mg Sulf 17.5-3.13-1.6 GM/177ML Soln Commonly known as: Suprep Bowel Prep Kit       TAKE these medications    Accu-Chek Aviva device Use as instructed to check blood sugar up to 3 times daily.   accu-chek softclix lancets Use as instructed daily.   Accu-Chek Softclix Lancets lancets Use as instructed to check blood sugar up to 3 times daily.   amLODipine 5 MG tablet Commonly known as: NORVASC TAKE 1 TABLET(5 MG) BY MOUTH DAILY What changed: See the new instructions.   aspirin EC 81 MG tablet Take 81 mg by mouth daily.   atorvastatin 40 MG tablet Commonly known as: LIPITOR TAKE 1 TABLET(40 MG) BY MOUTH DAILY What changed: See the new instructions.   calcium carbonate 500 MG chewable tablet Commonly known as: TUMS - dosed in mg elemental calcium Chew 1 tablet (200 mg of elemental calcium total) by mouth 2 (two) times daily.   FeroSul 325 (65 FE) MG tablet Generic drug: ferrous sulfate TAKE 1 TABLET(325 MG) BY MOUTH DAILY WITH BREAKFAST What changed: See the new instructions.   glucose blood test strip Commonly known as: Accu-Chek Aviva Plus Use as instructed to check blood sugar up to 3 times daily.   HECTOROL IV Doxercalciferol (Hectorol)   Multivitamin Adult Tabs Take 1 tablet by mouth daily. What changed: when to take this   nitroGLYCERIN 0.4 MG SL tablet Commonly known as: NITROSTAT PLACE 1 TABLET BY MOUTH UNDER TONGUE EVERY 5 MINUTES FOR  3 DOSES AS NEEDED FOR CHEST PAIN What changed: See the new instructions.   sevelamer carbonate 800 MG tablet Commonly known as: RENVELA Take by mouth See admin instructions. Take 2 tablets po three times daily with meals and take 1 tablet twice a day  as needed with snacks   timolol 0.5 % ophthalmic gel-forming Commonly known as: TIMOPTIC-XR Place 1 drop into both eyes daily.   Travatan Z 0.004 % Soln ophthalmic solution Generic drug: Travoprost (BAK Free) Place 1 drop into both eyes at bedtime.   Trulicity 1.5 LF/8.1OF Sopn Generic drug: Dulaglutide Inject 1.5 mg into the skin once a week.          Follow-up Information     Charlott Rakes, MD. Schedule an appointment as soon as possible for a visit in 1 week(s).   Specialty: Family Medicine Contact information: [place] [address] [phone]                No Known Allergies  Consultations: Nephrology/cardiology   Procedures/Studies: CT Angio Chest PE W and/or Wo Contrast  Result Date: 11/10/2021 CLINICAL DATA:  Chest pain and shortness of breath. EXAM: CT ANGIOGRAPHY CHEST WITH CONTRAST TECHNIQUE: Multidetector CT imaging of the chest was performed using the standard protocol during bolus administration of intravenous contrast. Multiplanar CT image reconstructions and MIPs were obtained to evaluate the vascular anatomy. RADIATION DOSE REDUCTION: This exam was performed according to the departmental dose-optimization program which includes automated exposure control, adjustment of the mA and/or kV according to patient size and/or use of iterative reconstruction technique. CONTRAST:  71m OMNIPAQUE IOHEXOL 350 MG/ML SOLN, 599mOMNIPAQUE IOHEXOL 350 MG/ML SOLN COMPARISON:  Chest CT dated 09/12/2006 and radiograph dated 11/09/2021. FINDINGS: Evaluation of this exam is limited due to respiratory motion artifact. Cardiovascular: There is no cardiomegaly or pericardial effusion. The thoracic aorta is unremarkable. The origins of the great vessels of the aortic arch appear patent. There is dilatation of the main pulmonary trunk suggestive of pulmonary hypertension. Evaluation of the pulmonary arteries is limited due to respiratory motion.  No large or central pulmonary artery embolus identified. Mediastinum/Nodes: No hilar or mediastinal adenopathy. The esophagus is grossly unremarkable. And symmetric enlargement of the left thyroid lobe, likely secondary to a 3 cm nodule. Recommend thyroid USKorearef: J Am Coll Radiol. 2015 Feb;12(2): 143-50).No mediastinal fluid collection. Lungs/Pleura: There is eventration of the diaphragms, left greater than right with left lung base atelectasis. No consolidative changes. There is no pleural effusion or pneumothorax. The central airways are patent. Upper Abdomen: No acute abnormality. Musculoskeletal: Degenerative changes of the spine. No acute osseous pathology. Review of the MIP images confirms the above findings. IMPRESSION: 1. No acute intrathoracic pathology. No CT evidence of central pulmonary artery embolus. 2. Evidence of pulmonary arterial hypertension. 3. Eventration of the diaphragms, left greater than right with left lung base atelectasis. 4. A 3 cm left thyroid lobe nodule. Further evaluation with ultrasound recommended. Electronically Signed   By: ArAnner Crete.D.   On: 11/10/2021 02:40   DG Chest 2 View  Result Date: 11/09/2021 CLINICAL DATA:  Intermittent chest pain and facial pain starting last night. Nonsmoker. EXAM: CHEST - 2 VIEW COMPARISON:  07/01/2019 FINDINGS: Shallow inspiration with atelectasis in the lung bases. Cardiac enlargement with mild vascular congestion. No airspace disease or consolidation. No pleural effusions. No pneumothorax. Mediastinal contours appear intact. Degenerative changes in the spine. Vascular graft over the right axillary region. Gas-filled colon without distention may  indicate ileus. IMPRESSION: 1. Shallow inspiration with linear atelectasis in the lung bases. 2. Cardiac enlargement with mild vascular congestion. Electronically Signed   By: Lucienne Capers M.D.   On: 11/09/2021 19:16      Subjective: Patient seen and examined at bedside.  Denies any  current chest pain, shortness of breath, nausea, vomiting.   Discharge Exam: Vitals:   11/10/21 0742 11/10/21 0953  BP: (!) 129/94 128/76  Pulse: 74   Resp: (!) 22 20  Temp: 97.6 F (36.4 C)   SpO2: 97% 96%    General: Pt is alert, awake, not in acute distress.  Currently on room air. Cardiovascular: rate controlled, S1/S2 + Respiratory: bilateral decreased breath sounds at bases; intermittently tachypneic Abdominal: Soft, morbidly obese, NT, ND, bowel sounds + Extremities: Trace lower extremity edema; no cyanosis    The results of significant diagnostics from this hospitalization (including imaging, microbiology, ancillary and laboratory) are listed below for reference.     Microbiology: Recent Results (from the past 240 hour(s))  SARS Coronavirus 2 by RT PCR (hospital order, performed in [facility] hospital lab) *cepheid single result test* Anterior Nasal Swab     Status: None   Collection Time: 11/09/21 11:31 PM   Specimen: Anterior Nasal Swab  Result Value Ref Range Status   SARS Coronavirus 2 by RT PCR NEGATIVE NEGATIVE Final    Comment: (NOTE) SARS-CoV-2 target nucleic acids are NOT DETECTED.  The SARS-CoV-2 RNA is generally detectable in upper and lower respiratory specimens during the acute phase of infection. The lowest concentration of SARS-CoV-2 viral copies this assay can detect is 250 copies / mL. A negative result does not preclude SARS-CoV-2 infection and should not be used as the sole basis for treatment or other patient management decisions.  A negative result may occur with improper specimen collection / handling, submission of specimen other than nasopharyngeal swab, presence of viral mutation(s) within the areas targeted by this assay, and inadequate number of viral copies (<250 copies / mL). A negative result must be combined with clinical observations, patient history, and epidemiological information.  Fact Sheet for Patients:    https://www.patel.info/  Fact Sheet for Healthcare Providers: https://hall.com/  This test is not yet approved or  cleared by the [place] FDA and has been authorized for detection and/or diagnosis of SARS-CoV-2 by FDA under an Emergency Use Authorization (EUA).  This EUA will remain in effect (meaning this test can be used) for the duration of the COVID-19 declaration under Section 564(b)(1) of the Act, 21 U.S.C. section 360bbb-3(b)(1), unless the authorization is terminated or revoked sooner.  Performed at [facility], [place] [address] [place], [place] [address]   MRSA Next Gen by PCR, Nasal     Status: None   Collection Time: 11/10/21  5:06 AM   Specimen: Nasal Mucosa; Nasal Swab  Result Value Ref Range Status   MRSA by PCR Next Gen NOT DETECTED NOT DETECTED Final    Comment: (NOTE) The GeneXpert MRSA Assay (FDA approved for NASAL specimens only), is one component of a comprehensive MRSA colonization surveillance program. It is not intended to diagnose MRSA infection nor to guide or monitor treatment for MRSA infections. Test performance is not FDA approved in patients less than 81 years old. Performed at [facility], [place] [address], [place], [place] [address]      Labs: BNP (last 3 results) No results for input(s): "BNP" in the last 8760 hours. Basic Metabolic Panel: Recent Labs  Lab 11/09/21 1816 11/10/21 0318 11/10/21 0802  NA 136  --  135  K 4.4  --  4.5  CL 93*  --  93*  CO2 29  --  26  GLUCOSE 90  --  96  BUN 27*  --  30*  CREATININE 8.70* 9.49* 9.81*  CALCIUM 9.1  --  9.3  PHOS  --   --  7.2*   Liver Function Tests: Recent Labs  Lab 11/10/21 0802  ALBUMIN 3.0*   No results for input(s): "LIPASE", "AMYLASE" in the last 168 hours. No results for input(s): "AMMONIA" in the last 168 hours. CBC: Recent Labs  Lab 11/09/21 1816 11/10/21 0318  WBC 9.4 7.4  HGB 12.2* 11.6*  HCT 39.4 36.8*   MCV 94.7 93.6  PLT 248 246   Cardiac Enzymes: No results for input(s): "CKTOTAL", "CKMB", "CKMBINDEX", "TROPONINI" in the last 168 hours. BNP: Invalid input(s): "POCBNP" CBG: No results for input(s): "GLUCAP" in the last 168 hours. D-Dimer No results for input(s): "DDIMER" in the last 72 hours. Hgb A1c No results for input(s): "HGBA1C" in the last 72 hours. Lipid Profile No results for input(s): "CHOL", "HDL", "LDLCALC", "TRIG", "CHOLHDL", "LDLDIRECT" in the last 72 hours. Thyroid function studies No results for input(s): "TSH", "T4TOTAL", "T3FREE", "THYROIDAB" in the last 72 hours.  Invalid input(s): "FREET3" Anemia work up No results for input(s): "VITAMINB12", "FOLATE", "FERRITIN", "TIBC", "IRON", "RETICCTPCT" in the last 72 hours. Urinalysis    Component Value Date/Time   COLORURINE AMBER (A) 07/01/2019 0044   APPEARANCEUR CLOUDY (A) 07/01/2019 0044   LABSPEC 1.015 07/01/2019 0044   PHURINE 5.0 07/01/2019 0044   GLUCOSEU NEGATIVE 07/01/2019 0044   HGBUR NEGATIVE 07/01/2019 0044   BILIRUBINUR NEGATIVE 07/01/2019 0044   BILIRUBINUR negative 12/16/2016 1019   KETONESUR NEGATIVE 07/01/2019 0044   PROTEINUR >=300 (A) 07/01/2019 0044   UROBILINOGEN 1.0 12/16/2016 1019   UROBILINOGEN 1.0 10/09/2014 0900   NITRITE NEGATIVE 07/01/2019 0044   LEUKOCYTESUR MODERATE (A) 07/01/2019 0044   Sepsis Labs Recent Labs  Lab 11/09/21 1816 11/10/21 0318  WBC 9.4 7.4   Microbiology Recent Results (from the past 240 hour(s))  SARS Coronavirus 2 by RT PCR (hospital order, performed in [facility] hospital lab) *cepheid single result test* Anterior Nasal Swab     Status: None   Collection Time: 11/09/21 11:31 PM   Specimen: Anterior Nasal Swab  Result Value Ref Range Status   SARS Coronavirus 2 by RT PCR NEGATIVE NEGATIVE Final    Comment: (NOTE) SARS-CoV-2 target nucleic acids are NOT DETECTED.  The SARS-CoV-2 RNA is generally detectable in upper and lower respiratory specimens  during the acute phase of infection. The lowest concentration of SARS-CoV-2 viral copies this assay can detect is 250 copies / mL. A negative result does not preclude SARS-CoV-2 infection and should not be used as the sole basis for treatment or other patient management decisions.  A negative result may occur with improper specimen collection / handling, submission of specimen other than nasopharyngeal swab, presence of viral mutation(s) within the areas targeted by this assay, and inadequate number of viral copies (<250 copies / mL). A negative result must be combined with clinical observations, patient history, and epidemiological information.  Fact Sheet for Patients:   https://www.patel.info/  Fact Sheet for Healthcare Providers: https://hall.com/  This test is not yet approved or  cleared by the [place] FDA and has been authorized for detection and/or diagnosis of SARS-CoV-2 by FDA under an Emergency Use Authorization (EUA).  This EUA will remain in effect (meaning this test can be used) for the duration of the COVID-19 declaration under Section 564(b)(1) of the Act, 21 U.S.C. section 360bbb-3(b)(1), unless the authorization is terminated or revoked sooner.  Performed at [facility], [place] [address], [place], [place] [address]   MRSA Next Gen by PCR, Nasal     Status: None   Collection Time: 11/10/21  5:06 AM   Specimen: Nasal Mucosa; Nasal Swab  Result Value Ref Range Status   MRSA by PCR Next Gen NOT DETECTED NOT DETECTED Final    Comment: (NOTE) The GeneXpert MRSA Assay (FDA approved for NASAL specimens only), is one component of a comprehensive MRSA colonization surveillance program. It is not intended to diagnose MRSA infection nor to guide or monitor treatment for MRSA infections. Test performance is not FDA approved in patients less than 64 years old. Performed at [facility], [place] [address],  [place], [place] [address]      Time coordinating discharge: 35 minutes  SIGNED:   Aline August, MD  [facility] 11/13/2021, 5:00 PM

## 2021-11-10 NOTE — Plan of Care (Signed)

## 2021-11-10 NOTE — ED Notes (Signed)
IV insertion attempted without success, IV team consult requested. 

## 2021-11-10 NOTE — Progress Notes (Signed)
[facility] floor coverage for both [facility] and [facility] (remote) on night of 11/09/21 into morning of 11/10/21:    I was notified by RN of patient's request for something for pain in the setting of headache radiating into the jaw.  I subsequently placed order for as needed IV Dilaudid.      Babs Bertin, DO Hospitalist

## 2021-11-10 NOTE — H&P (Signed)
History and Physical  Adam Dennis:[id number] DOB: [birth year] DOA: 11/09/2021  Referring physician: Dr. Randal Buba, EDP  PCP: Charlott Rakes, MD  Outpatient Specialists: Nephrology Patient coming from: Home  Chief Complaint: Chest pain   HPI: Adam Dennis is a 61 y.o. male with medical history significant for ESRD HD TTS, prior MI, essential hypertension, hyperlipidemia, type 2 diabetes, who presented to [facility] ED with complaints of nonexertional chest pain while sitting on the bus on his way home from work.  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  The patient describes his pain as centrally located, sharp pain, nonradiating, lasting 2 to 3 minutes.  His chest pain was not similar to prior MI.    In the ED, CTA chest was negative for PE.  High-sensitivity troponins were mildly elevated 40, 43.  Due to his risk factors and comorbidities, EDP requested admission for chest pain to rule out ACS.  At the time of this visit, his chest pain had completely resolved.  He received a full dose aspirin in the ED.  The patient was admitted by the hospitalist service, [facility].  ED Course: Tmax 98.4.  BP 130/68, pulse 79, respiratory 25, O2 saturation 100% on 2 L.  Lab studies remarkable for high-sensitivity troponin 40, 43.  WBC 9.4.  Hemoglobin 12.2.  Platelet count 248.  Review of Systems: Review of systems as noted in the HPI. All other systems reviewed and are negative.   Past Medical History:  Diagnosis Date   Arthritis    "back" (02/06/2017)   CHF (congestive heart failure) ([place])    new onset /Encounter Date 09/12/2006   Chronic combined systolic and diastolic heart failure (HCC)    CKD (chronic kidney disease) stage 4, GFR 15-29 ml/min (HCC)    COPD (chronic obstructive pulmonary disease) (HCC)    End stage renal disease on dialysis [facility])    Tues/ Thur/ Sat [place]   Glaucoma, right eye    High cholesterol    Hypertension    Hypertrophy of tonsils alone    Myocardial  infarction ([place])    "small one; ~ 2008" (02/06/2017)   Obesity, unspecified    On home oxygen therapy    "4L; 24/7" (04/10/2017)   OSA on CPAP    Type II diabetes mellitus ([place])    Past Surgical History:  Procedure Laterality Date   AV FISTULA PLACEMENT Right 07/01/2019   Procedure: Right arm arteriovenous fistual;  Surgeon: Waynetta Sandy, MD;  Location: [place];  Service: Vascular;  Laterality: Right;   FISTULA SUPERFICIALIZATION Right 09/01/2019   Procedure: RIGHT UPPER ARM FISTULA  TRANSPOSITION OF RIGHT UPPPER ARM BRACHIAL CEPHALIC FISTULA;  Surgeon: Waynetta Sandy, MD;  Location: [place];  Service: Vascular;  Laterality: Right;   HERNIA REPAIR     INSERTION OF DIALYSIS CATHETER Right 07/01/2019   Procedure: INSERTION OF TUNNELED DIALYSIS CATHETER;  Surgeon: Waynetta Sandy, MD;  Location: [place];  Service: Vascular;  Laterality: Right;   TEE WITHOUT CARDIOVERSION N/A 04/16/2017   Procedure: TRANSESOPHAGEAL ECHOCARDIOGRAM (TEE);  Surgeon: [name], MD;  Location: [facility] ENDOSCOPY;  Service: Cardiovascular;  Laterality: N/A;   [name]   "when I was little baby"    Social History:  reports that he has never smoked. He has never used smokeless tobacco. He reports current alcohol use. He reports that he does not use drugs.   No Known Allergies  Family History  Problem Relation  Age of Onset   Hypertension Mother    Diabetes Sister    Colon cancer Sister    Diabetes Brother    Cancer Maternal Aunt    Amblyopia Neg Hx    Blindness Neg Hx    Cataracts Neg Hx    Glaucoma Neg Hx    Macular degeneration Neg Hx    Strabismus Neg Hx    Retinal detachment Neg Hx    Retinitis pigmentosa Neg Hx       Prior to Admission medications   Medication Sig Start Date End Date Taking? Authorizing Provider  amLODipine (NORVASC) 5 MG tablet TAKE 1 TABLET(5 MG) BY MOUTH DAILY Patient taking differently: Take 5 mg by mouth daily. TAKE 1 TABLET(5  MG) BY MOUTH DAILY 05/29/21  Yes Charlott Rakes, MD  aspirin EC 81 MG tablet Take 81 mg by mouth daily.   Yes [provider]  atorvastatin (LIPITOR) 40 MG tablet TAKE 1 TABLET(40 MG) BY MOUTH DAILY Patient taking differently: Take 40 mg by mouth daily. 05/29/21  Yes Charlott Rakes, MD  calcium carbonate (TUMS - DOSED IN MG ELEMENTAL CALCIUM) 500 MG chewable tablet Chew 1 tablet (200 mg of elemental calcium total) by mouth 2 (two) times daily. Patient taking differently: Chew 500 mg by mouth 2 (two) times daily. 11/27/14  Yes Dixie Dials, MD  Doxercalciferol (HECTOROL IV) Doxercalciferol (Hectorol) 07/31/21 07/30/22 Yes [provider]  FEROSUL 325 (65 Fe) MG tablet TAKE 1 TABLET(325 MG) BY MOUTH DAILY WITH BREAKFAST Patient taking differently: Take 325 mg by mouth daily with breakfast. 04/11/20  Yes Charlott Rakes, MD  Multiple Vitamin (MULTIVITAMIN ADULT) TABS Take 1 tablet by mouth daily. Patient taking differently: Take 1 tablet by mouth at bedtime. 08/11/19  Yes Charlott Rakes, MD  sevelamer carbonate (RENVELA) 800 MG tablet Take by mouth See admin instructions. Take 2 tablets po three times daily with meals and take 1 tablet twice a day as needed with snacks 10/30/21  Yes [provider]  timolol (TIMOPTIC-XR) 0.5 % ophthalmic gel-forming Place 1 drop into both eyes daily.  10/31/18  Yes [provider]  TRAVATAN Z 0.004 % SOLN ophthalmic solution Place 1 drop into both eyes at bedtime. 11/01/15  Yes [provider]  ACCU-CHEK SOFTCLIX LANCETS lancets Use as instructed to check blood sugar up to 3 times daily. 06/19/18   Charlott Rakes, MD  Blood Glucose Monitoring Suppl (ACCU-CHEK AVIVA) device Use as instructed to check blood sugar up to 3 times daily. 06/19/18   Charlott Rakes, MD  Dulaglutide (TRULICITY) 1.5 BB/0.4UG SOPN Inject 1.5 mg into the skin once a week. 08/20/21   Charlott Rakes, MD  glucose blood (ACCU-CHEK AVIVA PLUS) test strip Use as  instructed to check blood sugar up to 3 times daily. 06/19/18   Charlott Rakes, MD  Lancet Devices [facility]) lancets Use as instructed daily. 05/14/17   Charlott Rakes, MD  Na Sulfate-K Sulfate-Mg Sulf (SUPREP BOWEL PREP KIT) 17.5-3.13-1.6 GM/177ML SOLN Take 1 kit by mouth as directed. Patient not taking: Reported on 11/09/2021 10/26/21   Milus Banister, MD  nitroGLYCERIN (NITROSTAT) 0.4 MG SL tablet PLACE 1 TABLET BY MOUTH UNDER TONGUE EVERY 5 MINUTES FOR 3 DOSES AS NEEDED FOR CHEST PAIN Patient taking differently: Place 0.4 mg under the tongue every 5 (five) minutes as needed for chest pain. 01/29/19   Charlott Rakes, MD    Physical Exam: BP 138/68 (BP Location: Left Arm)   Pulse 79   Temp 97.6 F (36.4  C) (Oral)   Resp (!) 25   Ht 5' 11"  (1.803 m)   Wt (!) 146.5 kg   SpO2 100%   BMI 45.05 kg/m   General: 61 y.o. year-old male well developed well nourished in no acute distress.  Alert and oriented x3. Cardiovascular: Regular rate and rhythm with no rubs or gallops.  No thyromegaly or JVD noted.  Trace lower extremity edema bilaterally. Respiratory: Clear to auscultation with no wheezes or rales. Good inspiratory effort. Abdomen: Soft nontender nondistended with normal bowel sounds x4 quadrants. Muskuloskeletal: No cyanosis or clubbing.  Trace edema noted bilaterally Neuro: CN II-XII intact, strength, sensation, reflexes Skin: No ulcerative lesions noted or rashes Psychiatry: Judgement and insight appear normal. Mood is appropriate for condition and setting          Labs on Admission:  Basic Metabolic Panel: Recent Labs  Lab 11/09/21 1816  NA 136  K 4.4  CL 93*  CO2 29  GLUCOSE 90  BUN 27*  CREATININE 8.70*  CALCIUM 9.1   Liver Function Tests: No results for input(s): "AST", "ALT", "ALKPHOS", "BILITOT", "PROT", "ALBUMIN" in the last 168 hours. No results for input(s): "LIPASE", "AMYLASE" in the last 168 hours. No results for input(s): "AMMONIA" in the last  168 hours. CBC: Recent Labs  Lab 11/09/21 1816  WBC 9.4  HGB 12.2*  HCT 39.4  MCV 94.7  PLT 248   Cardiac Enzymes: No results for input(s): "CKTOTAL", "CKMB", "CKMBINDEX", "TROPONINI" in the last 168 hours.  BNP (last 3 results) No results for input(s): "BNP" in the last 8760 hours.  ProBNP (last 3 results) No results for input(s): "PROBNP" in the last 8760 hours.  CBG: No results for input(s): "GLUCAP" in the last 168 hours.  Radiological Exams on Admission: CT Angio Chest PE W and/or Wo Contrast  Result Date: 11/10/2021 CLINICAL DATA:  Chest pain and shortness of breath. EXAM: CT ANGIOGRAPHY CHEST WITH CONTRAST TECHNIQUE: Multidetector CT imaging of the chest was performed using the standard protocol during bolus administration of intravenous contrast. Multiplanar CT image reconstructions and MIPs were obtained to evaluate the vascular anatomy. RADIATION DOSE REDUCTION: This exam was performed according to the departmental dose-optimization program which includes automated exposure control, adjustment of the mA and/or kV according to patient size and/or use of iterative reconstruction technique. CONTRAST:  37m OMNIPAQUE IOHEXOL 350 MG/ML SOLN, 524mOMNIPAQUE IOHEXOL 350 MG/ML SOLN COMPARISON:  Chest CT dated 09/12/2006 and radiograph dated 11/09/2021. FINDINGS: Evaluation of this exam is limited due to respiratory motion artifact. Cardiovascular: There is no cardiomegaly or pericardial effusion. The thoracic aorta is unremarkable. The origins of the great vessels of the aortic arch appear patent. There is dilatation of the main pulmonary trunk suggestive of pulmonary hypertension. Evaluation of the pulmonary arteries is limited due to respiratory motion. No large or central pulmonary artery embolus identified. Mediastinum/Nodes: No hilar or mediastinal adenopathy. The esophagus is grossly unremarkable. And symmetric enlargement of the left thyroid lobe, likely secondary to a 3 cm nodule.  Recommend thyroid US[place]are[place]: J Am Coll Radiol. 2015 Feb;12(2): 143-50).No mediastinal fluid collection. Lungs/Pleura: There is eventration of the diaphragms, left greater than right with left lung base atelectasis. No consolidative changes. There is no pleural effusion or pneumothorax. The central airways are patent. Upper Abdomen: No acute abnormality. Musculoskeletal: Degenerative changes of the spine. No acute osseous pathology. Review of the MIP images confirms the above findings. IMPRESSION: 1. No acute intrathoracic pathology. No CT evidence of central pulmonary artery embolus. 2.  Evidence of pulmonary arterial hypertension. 3. Eventration of the diaphragms, left greater than right with left lung base atelectasis. 4. A 3 cm left thyroid lobe nodule. Further evaluation with ultrasound recommended. Electronically Signed   By: Anner Crete M.D.   On: 11/10/2021 02:40   DG Chest 2 View  Result Date: 11/09/2021 CLINICAL DATA:  Intermittent chest pain and facial pain starting last night. Nonsmoker. EXAM: CHEST - 2 VIEW COMPARISON:  07/01/2019 FINDINGS: Shallow inspiration with atelectasis in the lung bases. Cardiac enlargement with mild vascular congestion. No airspace disease or consolidation. No pleural effusions. No pneumothorax. Mediastinal contours appear intact. Degenerative changes in the spine. Vascular graft over the right axillary region. Gas-filled colon without distention may indicate ileus. IMPRESSION: 1. Shallow inspiration with linear atelectasis in the lung bases. 2. Cardiac enlargement with mild vascular congestion. Electronically Signed   By: Lucienne Capers M.D.   On: 11/09/2021 19:16    EKG: I independently viewed the EKG done and my findings are as followed: Sinus rhythm rate of 99.  Nonspecific ST-T changes.  QTc 438.  Assessment/Plan Present on Admission:  Chest pain  Principal Problem:   Chest pain  Nonexertional atypical chest pain, rule out ACS Received a full dose  aspirin in the ED 324 mg x 1 High-sensitivity troponin mildly elevated, peaked at 43 Trend troponin Serial twelve-lead EKG CTA chest negative for PE Follow 2D echo Last LDL 72 on 08/20/2021. Denies any anginal symptoms at the time of this visit Resume home aspirin 81 mg daily and Lipitor 40 mg daily Monitor on telemetry Consult cardiology in the morning  ESRD on HD TTS Last hemodialysis was on Thursday Nephrology, Dr. Augustin Coupe, consulted to resume hemodialysis while hospitalized Volume status and electrolytes managed with hemodialysis  Essential hypertension BP stable Resume home oral antihypertensives Monitor vital signs  Hyperlipidemia Resume home statin  Prediabetes Last hemoglobin A1c 6.0 on 05/24/2020 Hold off insulin sliding scale for now to avoid hypoglycemia Renal carb modified diet      DVT prophylaxis: Subcu heparin 3 times daily  Code Status: Full code  Family Communication: None at bedside  Disposition Plan: Admitted to telemetry cardiac unit  Consults called: Please consult cardiology in the morning  Admission status: Observation status   Status is: Observation    Kayleen Memos MD [facility] Pager [phone]  If 7PM-7AM, please contact night-coverage [email or web address] Password [facility]  11/10/2021, 3:03 AM

## 2021-11-10 NOTE — Consult Note (Signed)
Referring Physician: Aline August, MD  Adam Dennis is an 61 y.o. male.                       Chief Complaint: Chest pain  HPI: 61 years old black male with PMH of HTN, ESRD with HD TTS, HLD, type 2 DM and obesity had non-exertional, sharp, central chest pain lasting for 2-3 minutes. His EKG is unremarkable but Troponin I levels are minimally elevated. Chest x-ray showed cardiomegaly and mild vascular congestion. CT chest is negative for PE but positive for 4 cm left thyroid nodule. Echocardiogram is pending.  Past Medical History:  Diagnosis Date   Arthritis    "back" (02/06/2017)   CHF (congestive heart failure) ([place])    new onset /Encounter Date 09/12/2006   Chronic combined systolic and diastolic heart failure (HCC)    CKD (chronic kidney disease) stage 4, GFR 15-29 ml/min (HCC)    COPD (chronic obstructive pulmonary disease) (HCC)    End stage renal disease on dialysis [facility])    Tues/ Thur/ Sat Aten   Glaucoma, right eye    High cholesterol    Hypertension    Hypertrophy of tonsils alone    Myocardial infarction ([place])    "small one; ~ 2008" (02/06/2017)   Obesity, unspecified    On home oxygen therapy    "4L; 24/7" (04/10/2017)   OSA on CPAP    Type II diabetes mellitus ([place])       Past Surgical History:  Procedure Laterality Date   AV FISTULA PLACEMENT Right 07/01/2019   Procedure: Right arm arteriovenous fistual;  Surgeon: Waynetta Sandy, MD;  Location: [place];  Service: Vascular;  Laterality: Right;   FISTULA SUPERFICIALIZATION Right 09/01/2019   Procedure: RIGHT UPPER ARM FISTULA  TRANSPOSITION OF RIGHT UPPPER ARM BRACHIAL CEPHALIC FISTULA;  Surgeon: Waynetta Sandy, MD;  Location: [place];  Service: Vascular;  Laterality: Right;   HERNIA REPAIR     INSERTION OF DIALYSIS CATHETER Right 07/01/2019   Procedure: INSERTION OF TUNNELED DIALYSIS CATHETER;  Surgeon: Waynetta Sandy, MD;  Location: [place];  Service: Vascular;  Laterality:  Right;   TEE WITHOUT CARDIOVERSION N/A 04/16/2017   Procedure: TRANSESOPHAGEAL ECHOCARDIOGRAM (TEE);  Surgeon: [name], MD;  Location: [facility] ENDOSCOPY;  Service: Cardiovascular;  Laterality: N/A;   UMBILICAL HERNIA REPAIR  1960s   "when I was little baby"    Family History  Problem Relation Age of Onset   Hypertension Mother    Diabetes Sister    Colon cancer Sister    Diabetes Brother    Cancer Maternal Aunt    Amblyopia Neg Hx    Blindness Neg Hx    Cataracts Neg Hx    Glaucoma Neg Hx    Macular degeneration Neg Hx    Strabismus Neg Hx    Retinal detachment Neg Hx    Retinitis pigmentosa Neg Hx    Social History:  reports that he has never smoked. He has never used smokeless tobacco. He reports current alcohol use. He reports that he does not use drugs.  Allergies: No Known Allergies  Medications Prior to Admission  Medication Sig Dispense Refill   amLODipine (NORVASC) 5 MG tablet TAKE 1 TABLET(5 MG) BY MOUTH DAILY (Patient taking differently: Take 5 mg by mouth daily. TAKE 1 TABLET(5 MG) BY MOUTH DAILY) 90 tablet 1   aspirin EC 81 MG tablet Take 81 mg by mouth daily.     atorvastatin (  LIPITOR) 40 MG tablet TAKE 1 TABLET(40 MG) BY MOUTH DAILY (Patient taking differently: Take 40 mg by mouth daily.) 90 tablet 1   calcium carbonate (TUMS - DOSED IN MG ELEMENTAL CALCIUM) 500 MG chewable tablet Chew 1 tablet (200 mg of elemental calcium total) by mouth 2 (two) times daily. (Patient taking differently: Chew 500 mg by mouth 2 (two) times daily.)     Doxercalciferol (HECTOROL IV) Doxercalciferol (Hectorol)     FEROSUL 325 (65 Fe) MG tablet TAKE 1 TABLET(325 MG) BY MOUTH DAILY WITH BREAKFAST (Patient taking differently: Take 325 mg by mouth daily with breakfast.) 100 tablet 3   Multiple Vitamin (MULTIVITAMIN ADULT) TABS Take 1 tablet by mouth daily. (Patient taking differently: Take 1 tablet by mouth at bedtime.) 30 tablet 3   sevelamer carbonate (RENVELA) 800 MG tablet Take by  mouth See admin instructions. Take 2 tablets po three times daily with meals and take 1 tablet twice a day as needed with snacks     timolol (TIMOPTIC-XR) 0.5 % ophthalmic gel-forming Place 1 drop into both eyes daily.      TRAVATAN Z 0.004 % SOLN ophthalmic solution Place 1 drop into both eyes at bedtime.  12   ACCU-CHEK SOFTCLIX LANCETS lancets Use as instructed to check blood sugar up to 3 times daily. 100 each 11   Blood Glucose Monitoring Suppl (ACCU-CHEK AVIVA) device Use as instructed to check blood sugar up to 3 times daily. 1 each 0   Dulaglutide (TRULICITY) 1.5 AT/5.5DD SOPN Inject 1.5 mg into the skin once a week. 2 mL 6   glucose blood (ACCU-CHEK AVIVA PLUS) test strip Use as instructed to check blood sugar up to 3 times daily. 100 each 11   Lancet Devices (ACCU-CHEK SOFTCLIX) lancets Use as instructed daily. 1 each 5   Na Sulfate-K Sulfate-Mg Sulf (SUPREP BOWEL PREP KIT) 17.5-3.13-1.6 GM/177ML SOLN Take 1 kit by mouth as directed. (Patient not taking: Reported on 11/09/2021) 324 mL 0   nitroGLYCERIN (NITROSTAT) 0.4 MG SL tablet PLACE 1 TABLET BY MOUTH UNDER TONGUE EVERY 5 MINUTES FOR 3 DOSES AS NEEDED FOR CHEST PAIN (Patient taking differently: Place 0.4 mg under the tongue every 5 (five) minutes as needed for chest pain.) 25 tablet 0    Results for orders placed or performed during the hospital encounter of 11/09/21 (from the past 48 hour(s))  Basic metabolic panel     Status: Abnormal   Collection Time: 11/09/21  6:16 PM  Result Value Ref Range   Sodium 136 135 - 145 mmol/L   Potassium 4.4 3.5 - 5.1 mmol/L   Chloride 93 (L) 98 - 111 mmol/L   CO2 29 22 - 32 mmol/L   Glucose, Bld 90 70 - 99 mg/dL    Comment: Glucose reference range applies only to samples taken after fasting for at least 8 hours.   BUN 27 (H) 6 - 20 mg/dL   Creatinine, Ser 8.70 (H) 0.61 - 1.24 mg/dL   Calcium 9.1 8.9 - 10.3 mg/dL   GFR, Estimated 6 (L) >60 mL/min    Comment: (NOTE) Calculated using the CKD-EPI  Creatinine Equation (2021)    Anion gap 14 5 - 15    Comment: Performed at [facility] [address] [place] [address]  CBC     Status: Abnormal   Collection Time: 11/09/21  6:16 PM  Result Value Ref Range   WBC 9.4 4.0 - 10.5 K/uL   RBC 4.16 (L) 4.22 - 5.81  MIL/uL   Hemoglobin 12.2 (L) 13.0 - 17.0 g/dL   HCT 39.4 39.0 - 52.0 %   MCV 94.7 80.0 - 100.0 fL   MCH 29.3 26.0 - 34.0 pg   MCHC 31.0 30.0 - 36.0 g/dL   RDW 16.6 (H) 11.5 - 15.5 %   Platelets 248 150 - 400 K/uL   nRBC 0.0 0.0 - 0.2 %    Comment: Performed at [facility] [address] [place], [place] [address]  Troponin I (High Sensitivity)     Status: Abnormal   Collection Time: 11/09/21  6:16 PM  Result Value Ref Range   Troponin I (High Sensitivity) 40 (H) <18 ng/L    Comment: (NOTE) Elevated high sensitivity troponin I (hsTnI) values and significant  changes across serial measurements may suggest ACS but many other  chronic and acute conditions are known to elevate hsTnI results.  Refer to the "Links" section for chest pain algorithms and additional  guidance. Performed at [facility], [place] [address] [place], [place] [address]   Troponin I (High Sensitivity)     Status: Abnormal   Collection Time: 11/09/21  9:16 PM  Result Value Ref Range   Troponin I (High Sensitivity) 43 (H) <18 ng/L    Comment: (NOTE) Elevated high sensitivity troponin I (hsTnI) values and significant  changes across serial measurements may suggest ACS but many other  chronic and acute conditions are known to elevate hsTnI results.  Refer to the "Links" section for chest pain algorithms and additional  guidance. Performed at [facility], [place] [address], [place], [place] [address]   SARS Coronavirus 2 by RT PCR (hospital order, performed in [facility] hospital lab) *cepheid single result test* Anterior Nasal Swab     Status: None   Collection Time: 11/09/21 11:31 PM   Specimen: Anterior Nasal Swab   Result Value Ref Range   SARS Coronavirus 2 by RT PCR NEGATIVE NEGATIVE    Comment: (NOTE) SARS-CoV-2 target nucleic acids are NOT DETECTED.  The SARS-CoV-2 RNA is generally detectable in upper and lower respiratory specimens during the acute phase of infection. The lowest concentration of SARS-CoV-2 viral copies this assay can detect is 250 copies / mL. A negative result does not preclude SARS-CoV-2 infection and should not be used as the sole basis for treatment or other patient management decisions.  A negative result may occur with improper specimen collection / handling, submission of specimen other than nasopharyngeal swab, presence of viral mutation(s) within the areas targeted by this assay, and inadequate number of viral copies (<250 copies / mL). A negative result must be combined with clinical observations, patient history, and epidemiological information.  Fact Sheet for Patients:   https://www.patel.info/  Fact Sheet for Healthcare Providers: https://hall.com/  This test is not yet approved or  cleared by the [place] FDA and has been authorized for detection and/or diagnosis of SARS-CoV-2 by FDA under an Emergency Use Authorization (EUA).  This EUA will remain in effect (meaning this test can be used) for the duration of the COVID-19 declaration under Section 564(b)(1) of the Act, 21 U.S.C. section 360bbb-3(b)(1), unless the authorization is terminated or revoked sooner.  Performed at [facility], [place] [address], [place], [place] [address]   HIV Antibody (routine testing w rflx)     Status: None   Collection Time: 11/10/21  3:18 AM  Result Value Ref Range   HIV Screen 4th Generation wRfx Non Reactive Non Reactive  Comment: Performed at [facility], [place] [address] [place], [place] [address]  CBC     Status: Abnormal   Collection Time: 11/10/21  3:18 AM  Result Value Ref Range   WBC 7.4 4.0 - 10.5  K/uL   RBC 3.93 (L) 4.22 - 5.81 MIL/uL   Hemoglobin 11.6 (L) 13.0 - 17.0 g/dL   HCT 36.8 (L) 39.0 - 52.0 %   MCV 93.6 80.0 - 100.0 fL   MCH 29.5 26.0 - 34.0 pg   MCHC 31.5 30.0 - 36.0 g/dL   RDW 16.3 (H) 11.5 - 15.5 %   Platelets 246 150 - 400 K/uL   nRBC 0.0 0.0 - 0.2 %    Comment: Performed at [facility], [place] [address] [place], [place] [address]  Creatinine, serum     Status: Abnormal   Collection Time: 11/10/21  3:18 AM  Result Value Ref Range   Creatinine, Ser 9.49 (H) 0.61 - 1.24 mg/dL   GFR, Estimated 6 (L) >60 mL/min    Comment: (NOTE) Calculated using the CKD-EPI Creatinine Equation (2021) Performed at [facility] [address] [place], [place] [address]   Troponin I (High Sensitivity)     Status: Abnormal   Collection Time: 11/10/21  3:18 AM  Result Value Ref Range   Troponin I (High Sensitivity) 44 (H) <18 ng/L    Comment: (NOTE) Elevated high sensitivity troponin I (hsTnI) values and significant  changes across serial measurements may suggest ACS but many other  chronic and acute conditions are known to elevate hsTnI results.  Refer to the "Links" section for chest pain algorithms and additional  guidance. Performed at [facility], [place] [address] [place], [place] [address]   MRSA Next Gen by PCR, Nasal     Status: None   Collection Time: 11/10/21  5:06 AM   Specimen: Nasal Mucosa; Nasal Swab  Result Value Ref Range   MRSA by PCR Next Gen NOT DETECTED NOT DETECTED    Comment: (NOTE) The GeneXpert MRSA Assay (FDA approved for NASAL specimens only), is one component of a comprehensive MRSA colonization surveillance program. It is not intended to diagnose MRSA infection nor to guide or monitor treatment for MRSA infections. Test performance is not FDA approved in patients less than 74 years old. Performed at [facility], [place] [address] [place], [place] [address]   Troponin I (High Sensitivity)     Status: Abnormal    Collection Time: 11/10/21  8:02 AM  Result Value Ref Range   Troponin I (High Sensitivity) 41 (H) <18 ng/L    Comment: (NOTE) Elevated high sensitivity troponin I (hsTnI) values and significant  changes across serial measurements may suggest ACS but many other  chronic and acute conditions are known to elevate hsTnI results.  Refer to the "Links" section for chest pain algorithms and additional  guidance. Performed at [facility], [place] [address], [place], [place] [address]   Renal function panel     Status: Abnormal   Collection Time: 11/10/21  8:02 AM  Result Value Ref Range   Sodium 135 135 - 145 mmol/L   Potassium 4.5 3.5 - 5.1 mmol/L   Chloride 93 (L) 98 - 111 mmol/L   CO2 26 22 - 32 mmol/L   Glucose, Bld 96 70 - 99 mg/dL    Comment: Glucose reference range applies only to samples taken after fasting for at least 8 hours.   BUN  30 (H) 6 - 20 mg/dL   Creatinine, Ser 9.81 (H) 0.61 - 1.24 mg/dL   Calcium 9.3 8.9 - 10.3 mg/dL   Phosphorus 7.2 (H) 2.5 - 4.6 mg/dL   Albumin 3.0 (L) 3.5 - 5.0 g/dL   GFR, Estimated 6 (L) >60 mL/min    Comment: (NOTE) Calculated using the CKD-EPI Creatinine Equation (2021)    Anion gap 16 (H) 5 - 15    Comment: Performed at [facility] [address], [place], [place] [address]   CT Angio Chest PE W and/or Wo Contrast  Result Date: 11/10/2021 CLINICAL DATA:  Chest pain and shortness of breath. EXAM: CT ANGIOGRAPHY CHEST WITH CONTRAST TECHNIQUE: Multidetector CT imaging of the chest was performed using the standard protocol during bolus administration of intravenous contrast. Multiplanar CT image reconstructions and MIPs were obtained to evaluate the vascular anatomy. RADIATION DOSE REDUCTION: This exam was performed according to the departmental dose-optimization program which includes automated exposure control, adjustment of the mA and/or kV according to patient size and/or use of iterative reconstruction technique. CONTRAST:  33mL  OMNIPAQUE IOHEXOL 350 MG/ML SOLN, 82mL OMNIPAQUE IOHEXOL 350 MG/ML SOLN COMPARISON:  Chest CT dated 09/12/2006 and radiograph dated 11/09/2021. FINDINGS: Evaluation of this exam is limited due to respiratory motion artifact. Cardiovascular: There is no cardiomegaly or pericardial effusion. The thoracic aorta is unremarkable. The origins of the great vessels of the aortic arch appear patent. There is dilatation of the main pulmonary trunk suggestive of pulmonary hypertension. Evaluation of the pulmonary arteries is limited due to respiratory motion. No large or central pulmonary artery embolus identified. Mediastinum/Nodes: No hilar or mediastinal adenopathy. The esophagus is grossly unremarkable. And symmetric enlargement of the left thyroid lobe, likely secondary to a 3 cm nodule. Recommend thyroid US (ref: J Am Coll Radiol. 2015 Feb;12(2): 143-50).No mediastinal fluid collection. Lungs/Pleura: There is eventration of the diaphragms, left greater than right with left lung base atelectasis. No consolidative changes. There is no pleural effusion or pneumothorax. The central airways are patent. Upper Abdomen: No acute abnormality. Musculoskeletal: Degenerative changes of the spine. No acute osseous pathology. Review of the MIP images confirms the above findings. IMPRESSION: 1. No acute intrathoracic pathology. No CT evidence of central pulmonary artery embolus. 2. Evidence of pulmonary arterial hypertension. 3. Eventration of the diaphragms, left greater than right with left lung base atelectasis. 4. A 3 cm left thyroid lobe nodule. Further evaluation with ultrasound recommended. Electronically Signed   By: Anner Crete M.D.   On: 11/10/2021 02:40   DG Chest 2 View  Result Date: 11/09/2021 CLINICAL DATA:  Intermittent chest pain and facial pain starting last night. Nonsmoker. EXAM: CHEST - 2 VIEW COMPARISON:  07/01/2019 FINDINGS: Shallow inspiration with atelectasis in the lung bases. Cardiac enlargement with  mild vascular congestion. No airspace disease or consolidation. No pleural effusions. No pneumothorax. Mediastinal contours appear intact. Degenerative changes in the spine. Vascular graft over the right axillary region. Gas-filled colon without distention may indicate ileus. IMPRESSION: 1. Shallow inspiration with linear atelectasis in the lung bases. 2. Cardiac enlargement with mild vascular congestion. Electronically Signed   By: Lucienne Capers M.D.   On: 11/09/2021 19:16    Review Of Systems Constitutional: No fever, chills, Chronic weight gain. Eyes: No vision change, wears glasses. No discharge or pain. Ears: No hearing loss, No tinnitus. Respiratory: No asthma, COPD, pneumonias. No shortness of breath. No hemoptysis. Cardiovascular: Positive chest pain, palpitation, no leg edema. Gastrointestinal: No nausea, vomiting, diarrhea, constipation. No  GI bleed. No hepatitis. Genitourinary: No dysuria, hematuria, kidney stone. No incontinance. Neurological: No headache, stroke, seizures.  Psychiatry: No psych facility admission for anxiety, depression, suicide. No detox. Skin: No rash. Musculoskeletal: No joint pain, fibromyalgia. No neck pain, back pain. Lymphadenopathy: No lymphadenopathy. Hematology: No anemia or easy bruising.   Blood pressure 139/77, pulse 78, temperature 98.1 F (36.7 C), temperature source Oral, resp. rate (!) 23, height _0  (1.803 m), weight (!) 146.5 kg, SpO2 96 %. Body mass index is 45.05 kg/m. General appearance: alert, cooperative, appears stated age and no distress Head: Normocephalic, atraumatic. Eyes: Brown eyes, pink conjunctiva, corneas clear.  Neck: No adenopathy, no carotid bruit, no JVD, supple, symmetrical, trachea midline and thyroid not enlarged. Resp: Clear to auscultation bilaterally. Cardio: Regular rate and rhythm, S1, S2 normal, II/VI systolic murmur, no click, rub or gallop GI: Soft, non-tender; bowel sounds normal; no  organomegaly. Extremities: Trace edema, no cyanosis or clubbing. Skin: Warm and dry.  Neurologic: Alert and oriented X 3, normal strength.   Assessment/Plan Acute coronary syndrome HTN HLD Type 2 DM Morbid obesity ESRD Abnormal Troponin I levels from demand ischemia  Plan: Continue medical treatment. Awaiting echocardiogram Nuclear stress test in AM  Time spent: Review of old records, Lab, x-rays, EKG, other cardiac tests, examination, discussion with patient/Doctor/Nurse over 70 minutes.  Birdie Riddle, MD  11/10/2021, 12:03 PM

## 2021-11-10 NOTE — ED Notes (Signed)
IV team at bedside 

## 2021-11-10 NOTE — ED Notes (Signed)
Room air sat noted to be 85% with good pleth; placed on 2L [place]

## 2021-11-10 NOTE — Consult Note (Signed)
Reason for Consult: ESRD Referring Physician:  Dr Irene Pap  Chief Complaint: Chest pain  61 y.o. yo male with CHF, OSA, obesity, COPD, ESRD admitted with sharp chest pain.  Dialysis orders [place] [facility], 4 hrs 15 min, Optiflux 200NRe, BFR 400, DFR Autoflow 1.5, EDW 145.5 (kg), Dialysate 2.0 K, 2.0 Ca, 1.0 Mg, 100 Dextrose (G2201), Sodium 137 (mEq/L), Bicarb Setting: 35 (mEq/L), UFR Profile: Profile 2, Sodium Model: None, Access: AVFistula Hect 49mg IV qtx, Heparin 10,200 Not on ESA.   Assessment/Plan:  1) ESRD: volume overload.  Started HD on 07/01/2019 after placement of right IJ TDC but now has been receiving HD through a right arm BCF.    Left above EDW past 2 treatments but prior to that he had been reaching EDW; cramping one prior week and I wonder if it was bec of higher IDWG or EDW challenging protocol.   2)   HTN: Restart home regimen and should improve with UF after dialysis.  Continue to monitor.     3)  Anemia of CKD: Hemoglobin at goal, no ESA needed.     4) Bone/ mineral: Phosphorus level was in range 09/2021; will recheck. On Hect 456m on HD.   5) h/o CHF: compensated   6) Chest pain: serial troponins   HPI: Adam WILLIAMSENs an [age].o. male with medical history significant for ESRD HD TTS _0  with Dr. PaPosey Prontoprior MI, essential hypertension, hyperlipidemia, type 2 diabetes, presented with complaints of nonexertional chest pain while sitting on the bus on his way home from work.  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  His chest pain was not similar to prior MI.  His last HD was on 6/15 and pt left at 146.8kg; prior treatment he left at 147 kg but prior week has reached his EDW. HST elevated but peaked at 43. Did not have any CP at time of consultation.   ROS Pertinent items are noted in HPI.  Chemistry and CBC: Creat  Date/Time Value Ref Range Status  11/30/2015 12:50  PM 2.89 (H) 0.70 - 1.33 mg/dL Corrected    Comment:      For patients > or = 5033ears of age: The upper reference limit for Creatinine is approximately 13% higher for people identified as African-American.     Amended report. Called amended report to [employer]/10/17 1425 FOSTN.   09/28/2015 05:46 PM 3.06 (H) 0.70 - 1.33 mg/dL Final  06/26/2015 10:47 AM 2.29 (H) 0.70 - 1.33 mg/dL Final  12/12/2014 10:09 AM 1.37 (H) 0.50 - 1.35 mg/dL Final  10/14/2014 10:02 AM 1.41 (H) 0.50 - 1.35 mg/dL Final  07/27/2013 09:09 AM 1.23 0.50 - 1.35 mg/dL Final   Creatinine, Ser  Date/Time Value Ref Range Status  11/10/2021 03:18 AM 9.49 (H) 0.61 - 1.24 mg/dL Final  11/09/2021 06:16 PM 8.70 (H) 0.61 - 1.24 mg/dL Final  09/01/2019 08:26 AM 6.20 (H) 0.61 - 1.24 mg/dL Final  07/06/2019 07:54 AM 7.58 (H) 0.61 - 1.24 mg/dL Final  07/04/2019 06:23 PM 5.52 (H) 0.61 - 1.24 mg/dL Final  07/03/2019 07:19 AM 5.57 (H) 0.61 - 1.24 mg/dL Final  07/02/2019 05:12 AM 6.80 (H) 0.61 - 1.24 mg/dL Final  07/01/2019 02:23 AM 7.51 (H) 0.61 - 1.24 mg/dL Final  06/30/2019 06:24 AM 6.80 (H) 0.61 - 1.24 mg/dL Final  03/24/2019 10:17 AM 4.40 (H) 0.76 - 1.27 mg/dL  Final  07/01/2018 11:14 AM 3.14 (H) 0.76 - 1.27 mg/dL Final  03/04/2018 11:31 AM 3.03 (H) 0.76 - 1.27 mg/dL Final  08/14/2017 09:56 AM 3.21 (H) 0.76 - 1.27 mg/dL Final  04/28/2017 12:36 PM 2.84 (H) 0.40 - 1.50 mg/dL Final  04/23/2017 11:22 AM 3.29 (H) 0.76 - 1.27 mg/dL Final  04/16/2017 03:43 AM 2.57 (H) 0.61 - 1.24 mg/dL Final  04/15/2017 03:56 AM 2.46 (H) 0.61 - 1.24 mg/dL Final  04/14/2017 03:48 AM 2.58 (H) 0.61 - 1.24 mg/dL Final  04/13/2017 04:34 AM 2.77 (H) 0.61 - 1.24 mg/dL Final  04/12/2017 05:06 AM 3.02 (H) 0.61 - 1.24 mg/dL Final  04/11/2017 05:22 AM 3.58 (H) 0.61 - 1.24 mg/dL Final  04/10/2017 07:59 AM 3.36 (H) 0.61 - 1.24 mg/dL Final  04/09/2017 03:47 AM 3.22 (H) 0.61 - 1.24 mg/dL Final  04/08/2017 07:21 PM 3.21 (H) 0.61 - 1.24 mg/dL Final   02/17/2017 12:54 PM 2.90 (H) 0.61 - 1.24 mg/dL Final  02/17/2017 12:23 PM 2.99 (H) 0.61 - 1.24 mg/dL Final  02/09/2017 04:34 AM 3.12 (H) 0.61 - 1.24 mg/dL Final  02/08/2017 06:18 AM 3.18 (H) 0.61 - 1.24 mg/dL Final  02/07/2017 05:08 AM 3.10 (H) 0.61 - 1.24 mg/dL Final  02/06/2017 09:35 AM 2.73 (H) 0.61 - 1.24 mg/dL Final  02/06/2017 02:32 AM 2.89 (H) 0.61 - 1.24 mg/dL Final  01/24/2017 10:22 AM 2.94 (H) 0.76 - 1.27 mg/dL Final  01/17/2017 06:46 AM 2.76 (H) 0.61 - 1.24 mg/dL Final  01/16/2017 04:16 AM 2.71 (H) 0.61 - 1.24 mg/dL Final  01/15/2017 01:24 PM 2.74 (H) 0.61 - 1.24 mg/dL Final  09/22/2016 02:54 AM 2.74 (H) 0.61 - 1.24 mg/dL Final  09/21/2016 02:52 AM 2.65 (H) 0.61 - 1.24 mg/dL Final  09/20/2016 03:38 AM 2.78 (H) 0.61 - 1.24 mg/dL Final  09/19/2016 01:06 PM 2.82 (H) 0.61 - 1.24 mg/dL Final  09/19/2016 09:42 AM 2.84 (H) 0.61 - 1.24 mg/dL Final  12/11/2015 01:14 PM 2.61 (H) 0.61 - 1.24 mg/dL Final  09/03/2015 11:37 AM 2.85 (H) 0.61 - 1.24 mg/dL Final  11/26/2014 02:56 AM 1.30 (H) 0.61 - 1.24 mg/dL Final  11/25/2014 04:16 AM 1.33 (H) 0.61 - 1.24 mg/dL Final  11/24/2014 04:10 AM 1.76 (H) 0.61 - 1.24 mg/dL Final  11/23/2014 11:12 AM 1.47 (H) 0.61 - 1.24 mg/dL Final  11/22/2014 02:04 PM 1.52 (H) 0.61 - 1.24 mg/dL Final  10/12/2014 03:48 AM 1.59 (H) 0.61 - 1.24 mg/dL Final  10/11/2014 02:55 AM 1.50 (H) 0.61 - 1.24 mg/dL Final  10/10/2014 04:10 PM 1.43 (H) 0.61 - 1.24 mg/dL Final  10/09/2014 09:36 AM 1.56 (H) 0.61 - 1.24 mg/dL Final  10/08/2014 02:53 AM 1.57 (H) 0.61 - 1.24 mg/dL Final   Recent Labs  Lab 11/09/21 1816 11/10/21 0318  NA 136  --   K 4.4  --   CL 93*  --   CO2 29  --   GLUCOSE 90  --   BUN 27*  --   CREATININE 8.70* 9.49*  CALCIUM 9.1  --    Recent Labs  Lab 11/09/21 1816 11/10/21 0318  WBC 9.4 7.4  HGB 12.2* 11.6*  HCT 39.4 36.8*  MCV 94.7 93.6  PLT 248 246   Liver Function Tests: No results for input(s): "AST", "ALT", "ALKPHOS", "BILITOT",  "PROT", "ALBUMIN" in the last 168 hours. No results for input(s): "LIPASE", "AMYLASE" in the last 168 hours. No results for input(s): "AMMONIA" in the last 168 hours. Cardiac Enzymes: No results for input(s): "CKTOTAL", "CKMB", "  CKMBINDEX", "TROPONINI" in the last 168 hours. Iron Studies: No results for input(s): "IRON", "TIBC", "TRANSFERRIN", "FERRITIN" in the last 72 hours. PT/INR: _0 (inr:5)  Xrays/Other Studies: ) Results for orders placed or performed during the hospital encounter of 11/09/21 (from the past 48 hour(s))  Basic metabolic panel     Status: Abnormal   Collection Time: 11/09/21  6:16 PM  Result Value Ref Range   Sodium 136 135 - 145 mmol/L   Potassium 4.4 3.5 - 5.1 mmol/L   Chloride 93 (L) 98 - 111 mmol/L   CO2 29 22 - 32 mmol/L   Glucose, Bld 90 70 - 99 mg/dL    Comment: Glucose reference range applies only to samples taken after fasting for at least 8 hours.   BUN 27 (H) 6 - 20 mg/dL   Creatinine, Ser 8.70 (H) 0.61 - 1.24 mg/dL   Calcium 9.1 8.9 - 10.3 mg/dL   GFR, Estimated 6 (L) >60 mL/min    Comment: (NOTE) Calculated using the CKD-EPI Creatinine Equation (2021)    Anion gap 14 5 - 15    Comment: Performed at [facility] [address] [place], [place] [address]  CBC     Status: Abnormal   Collection Time: 11/09/21  6:16 PM  Result Value Ref Range   WBC 9.4 4.0 - 10.5 K/uL   RBC 4.16 (L) 4.22 - 5.81 MIL/uL   Hemoglobin 12.2 (L) 13.0 - 17.0 g/dL   HCT 39.4 39.0 - 52.0 %   MCV 94.7 80.0 - 100.0 fL   MCH 29.3 26.0 - 34.0 pg   MCHC 31.0 30.0 - 36.0 g/dL   RDW 16.6 (H) 11.5 - 15.5 %   Platelets 248 150 - 400 K/uL   nRBC 0.0 0.0 - 0.2 %    Comment: Performed at [facility] [address] [place], [place] [address]  Troponin I (High Sensitivity)     Status: Abnormal   Collection Time: 11/09/21  6:16 PM  Result Value Ref Range   Troponin I (High Sensitivity) 40 (H) <18 ng/L    Comment: (NOTE) Elevated high sensitivity troponin I  (hsTnI) values and significant  changes across serial measurements may suggest ACS but many other  chronic and acute conditions are known to elevate hsTnI results.  Refer to the "Links" section for chest pain algorithms and additional  guidance. Performed at [facility], [place] [address], [place], [place] [address]   Troponin I (High Sensitivity)     Status: Abnormal   Collection Time: 11/09/21  9:16 PM  Result Value Ref Range   Troponin I (High Sensitivity) 43 (H) <18 ng/L    Comment: (NOTE) Elevated high sensitivity troponin I (hsTnI) values and significant  changes across serial measurements may suggest ACS but many other  chronic and acute conditions are known to elevate hsTnI results.  Refer to the "Links" section for chest pain algorithms and additional  guidance. Performed at [facility], [place] [address] [place], [place] [address]   SARS Coronavirus 2 by RT PCR (hospital order, performed in [facility] hospital lab) *cepheid single result test* Anterior Nasal Swab     Status: None   Collection Time: 11/09/21 11:31 PM   Specimen: Anterior Nasal Swab  Result Value Ref Range   SARS Coronavirus 2 by RT PCR NEGATIVE NEGATIVE    Comment: (NOTE) SARS-CoV-2 target nucleic acids are NOT DETECTED.  The SARS-CoV-2 RNA is generally detectable in upper and lower respiratory  specimens during the acute phase of infection. The lowest concentration of SARS-CoV-2 viral copies this assay can detect is 250 copies / mL. A negative result does not preclude SARS-CoV-2 infection and should not be used as the sole basis for treatment or other patient management decisions.  A negative result may occur with improper specimen collection / handling, submission of specimen other than nasopharyngeal swab, presence of viral mutation(s) within the areas targeted by this assay, and inadequate number of viral copies (<250 copies / mL). A negative result must be combined with  clinical observations, patient history, and epidemiological information.  Fact Sheet for Patients:   https://www.patel.info/  Fact Sheet for Healthcare Providers: https://hall.com/  This test is not yet approved or  cleared by the [place] FDA and has been authorized for detection and/or diagnosis of SARS-CoV-2 by FDA under an Emergency Use Authorization (EUA).  This EUA will remain in effect (meaning this test can be used) for the duration of the COVID-19 declaration under Section 564(b)(1) of the Act, 21 U.S.C. section 360bbb-3(b)(1), unless the authorization is terminated or revoked sooner.  Performed at [facility], [place] [address], [place], [place] [address]   CBC     Status: Abnormal   Collection Time: 11/10/21  3:18 AM  Result Value Ref Range   WBC 7.4 4.0 - 10.5 K/uL   RBC 3.93 (L) 4.22 - 5.81 MIL/uL   Hemoglobin 11.6 (L) 13.0 - 17.0 g/dL   HCT 36.8 (L) 39.0 - 52.0 %   MCV 93.6 80.0 - 100.0 fL   MCH 29.5 26.0 - 34.0 pg   MCHC 31.5 30.0 - 36.0 g/dL   RDW 16.3 (H) 11.5 - 15.5 %   Platelets 246 150 - 400 K/uL   nRBC 0.0 0.0 - 0.2 %    Comment: Performed at [facility], [place] [address] [place], [place] [address]  Creatinine, serum     Status: Abnormal   Collection Time: 11/10/21  3:18 AM  Result Value Ref Range   Creatinine, Ser 9.49 (H) 0.61 - 1.24 mg/dL   GFR, Estimated 6 (L) >60 mL/min    Comment: (NOTE) Calculated using the CKD-EPI Creatinine Equation (2021) Performed at [facility] [address] [place], [place] [address]   Troponin I (High Sensitivity)     Status: Abnormal   Collection Time: 11/10/21  3:18 AM  Result Value Ref Range   Troponin I (High Sensitivity) 44 (H) <18 ng/L    Comment: (NOTE) Elevated high sensitivity troponin I (hsTnI) values and significant  changes across serial measurements may suggest ACS but many other  chronic and acute conditions are known to elevate hsTnI  results.  Refer to the "Links" section for chest pain algorithms and additional  guidance. Performed at [facility], [place] [address], [place],  [address]   MRSA Next Gen by PCR, Nasal     Status: None   Collection Time: 11/10/21  5:06 AM   Specimen: Nasal Mucosa; Nasal Swab  Result Value Ref Range   MRSA by PCR Next Gen NOT DETECTED NOT DETECTED    Comment: (NOTE) The GeneXpert MRSA Assay (FDA approved for NASAL specimens only), is one component of a comprehensive MRSA colonization surveillance program. It is not intended to diagnose MRSA infection nor to guide or monitor treatment for MRSA infections. Test performance is not FDA approved in patients less than 45 years old. Performed at [facility], [place],  [place] [address]    CT Angio Chest PE W and/or Wo Contrast  Result Date: 11/10/2021 CLINICAL DATA:  Chest pain and shortness of breath. EXAM: CT ANGIOGRAPHY CHEST WITH CONTRAST TECHNIQUE: Multidetector CT imaging of the chest was performed using the standard protocol during bolus administration of intravenous contrast. Multiplanar CT image reconstructions and MIPs were obtained to evaluate the vascular anatomy. RADIATION DOSE REDUCTION: This exam was performed according to the departmental dose-optimization program which includes automated exposure control, adjustment of the mA and/or kV according to patient size and/or use of iterative reconstruction technique. CONTRAST:  54m OMNIPAQUE IOHEXOL 350 MG/ML SOLN, 527mOMNIPAQUE IOHEXOL 350 MG/ML SOLN COMPARISON:  Chest CT dated 09/12/2006 and radiograph dated 11/09/2021. FINDINGS: Evaluation of this exam is limited due to respiratory motion artifact. Cardiovascular: There is no cardiomegaly or pericardial effusion. The thoracic aorta is unremarkable. The origins of the great vessels of the aortic arch appear patent. There is dilatation of the main pulmonary trunk suggestive of pulmonary hypertension.  Evaluation of the pulmonary arteries is limited due to respiratory motion. No large or central pulmonary artery embolus identified. Mediastinum/Nodes: No hilar or mediastinal adenopathy. The esophagus is grossly unremarkable. And symmetric enlargement of the left thyroid lobe, likely secondary to a 3 cm nodule. Recommend thyroid USKorearef: J Am Coll Radiol. 2015 Feb;12(2): 143-50).No mediastinal fluid collection. Lungs/Pleura: There is eventration of the diaphragms, left greater than right with left lung base atelectasis. No consolidative changes. There is no pleural effusion or pneumothorax. The central airways are patent. Upper Abdomen: No acute abnormality. Musculoskeletal: Degenerative changes of the spine. No acute osseous pathology. Review of the MIP images confirms the above findings. IMPRESSION: 1. No acute intrathoracic pathology. No CT evidence of central pulmonary artery embolus. 2. Evidence of pulmonary arterial hypertension. 3. Eventration of the diaphragms, left greater than right with left lung base atelectasis. 4. A 3 cm left thyroid lobe nodule. Further evaluation with ultrasound recommended. Electronically Signed   By: ArAnner Crete.D.   On: 11/10/2021 02:40   DG Chest 2 View  Result Date: 11/09/2021 CLINICAL DATA:  Intermittent chest pain and facial pain starting last night. Nonsmoker. EXAM: CHEST - 2 VIEW COMPARISON:  07/01/2019 FINDINGS: Shallow inspiration with atelectasis in the lung bases. Cardiac enlargement with mild vascular congestion. No airspace disease or consolidation. No pleural effusions. No pneumothorax. Mediastinal contours appear intact. Degenerative changes in the spine. Vascular graft over the right axillary region. Gas-filled colon without distention may indicate ileus. IMPRESSION: 1. Shallow inspiration with linear atelectasis in the lung bases. 2. Cardiac enlargement with mild vascular congestion. Electronically Signed   By: WiLucienne Capers.D.   On: 11/09/2021  19:16    PMH:   Past Medical History:  Diagnosis Date   Arthritis    "back" (02/06/2017)   [place]) [place] [place]   new onset /Encounter Date 09/12/2006   Chronic combined systolic and diastolic heart failure (HCC)    CKD (chronic kidney disease) stage 4, GFR 15-29 ml/min (HCC)    COPD (chronic obstructive pulmonary disease) (HCC)    [facility]   [facility] Thur/ [place] Gl[place], right eye    High cholesterol    Hypertension    Hypertrophy of tonsils alone    [facility]ocardial infarction ([facility] [place]   "small one; ~ 2008" (02/06/2017)   Obesity, unspecified    On home oxygen therapy    "4L; 24/7" (04/10/2017)   OSA on  CPAP    Type II diabetes mellitus (HCC)     PSH:   Past Surgical History:  Procedure Laterality Date   AV FISTULA PLACEMENT Right 07/01/2019   Procedure: Right arm arteriovenous fistual;  Surgeon: Waynetta Sandy, MD;  Location: [facility];  Service: Vascular;  Laterality: Right;   FISTULA SUPERFICIALIZATION Right 09/01/2019   Procedure: RIGHT UPPER ARM FISTULA  TRANSPOSITION OF RIGHT UPPPER ARM BRACHIAL CEPHALIC FISTULA;  Surgeon: Waynetta Sandy, MD;  Location: [facility];  Service: Vascular;  Laterality: Right;   HERNIA REPAIR     INSERTION OF DIALYSIS CATHETER Right 07/01/2019   Procedure: INSERTION OF TUNNELED DIALYSIS CATHETER;  Surgeon: Waynetta Sandy, MD;  Location: [facility];  Service: Vascular;  Laterality: Right;   TEE WITHOUT CARDIOVERSION N/A 04/16/2017   Procedure: TRANSESOPHAGEAL ECHOCARDIOGRAM (TEE);  Surgeon: [name], MD;  Location: [facility];  Service: Cardiovascular;  Laterality: N/A;   [place]   "when I was little baby"    Allergies: No Known Allergies  Medications:   Prior to Admission medications   Medication Sig Start Date End Date Taking? Authorizing Provider  amLODipine (NORVASC) 5 MG tablet TAKE 1 TABLET(5 MG) BY MOUTH DAILY Patient taking  differently: Take 5 mg by mouth daily. TAKE 1 TABLET(5 MG) BY MOUTH DAILY 05/29/21  Yes Charlott Rakes, MD  aspirin EC 81 MG tablet Take 81 mg by mouth daily.   Yes [provider]  atorvastatin (LIPITOR) 40 MG tablet TAKE 1 TABLET(40 MG) BY MOUTH DAILY Patient taking differently: Take 40 mg by mouth daily. 05/29/21  Yes Charlott Rakes, MD  calcium carbonate (TUMS - DOSED IN MG ELEMENTAL CALCIUM) 500 MG chewable tablet Chew 1 tablet (200 mg of elemental calcium total) by mouth 2 (two) times daily. Patient taking differently: Chew 500 mg by mouth 2 (two) times daily. 11/27/14  Yes Dixie Dials, MD  Doxercalciferol (HECTOROL IV) Doxercalciferol (Hectorol) 07/31/21 07/30/22 Yes [provider]  FEROSUL 325 (65 Fe) MG tablet TAKE 1 TABLET(325 MG) BY MOUTH DAILY WITH BREAKFAST Patient taking differently: Take 325 mg by mouth daily with breakfast. 04/11/20  Yes Charlott Rakes, MD  Multiple Vitamin (MULTIVITAMIN ADULT) TABS Take 1 tablet by mouth daily. Patient taking differently: Take 1 tablet by mouth at bedtime. 08/11/19  Yes Charlott Rakes, MD  sevelamer carbonate (RENVELA) 800 MG tablet Take by mouth See admin instructions. Take 2 tablets po three times daily with meals and take 1 tablet twice a day as needed with snacks 10/30/21  Yes [provider]  timolol (TIMOPTIC-XR) 0.5 % ophthalmic gel-forming Place 1 drop into both eyes daily.  10/31/18  Yes [provider]  TRAVATAN Z 0.004 % SOLN ophthalmic solution Place 1 drop into both eyes at bedtime. 11/01/15  Yes [provider]  ACCU-CHEK SOFTCLIX LANCETS lancets Use as instructed to check blood sugar up to 3 times daily. 06/19/18   Charlott Rakes, MD  Blood Glucose Monitoring Suppl (ACCU-CHEK AVIVA) device Use as instructed to check blood sugar up to 3 times daily. 06/19/18   Charlott Rakes, MD  Dulaglutide (TRULICITY) 1.5 TG/6.2IR SOPN Inject 1.5 mg into the skin once a week. 08/20/21   Charlott Rakes, MD  glucose  blood (ACCU-CHEK AVIVA PLUS) test strip Use as instructed to check blood sugar up to 3 times daily. 06/19/18   Charlott Rakes, MD  Lancet Devices [facility]) lancets Use as instructed daily. 05/14/17   Charlott Rakes, MD  Na Sulfate-K Sulfate-Mg Sulf (  SUPREP BOWEL PREP KIT) 17.5-3.13-1.6 GM/177ML SOLN Take 1 kit by mouth as directed. Patient not taking: Reported on 11/09/2021 10/26/21   Milus Banister, MD  nitroGLYCERIN (NITROSTAT) 0.4 MG SL tablet PLACE 1 TABLET BY MOUTH UNDER TONGUE EVERY 5 MINUTES FOR 3 DOSES AS NEEDED FOR CHEST PAIN Patient taking differently: Place 0.4 mg under the tongue every 5 (five) minutes as needed for chest pain. 01/29/19   Charlott Rakes, MD    Discontinued Meds:   Medications Discontinued During This Encounter  Medication Reason   methylcellulose (CITRUCEL) oral powder Patient Preference    Social History:  reports that he has never smoked. He has never used smokeless tobacco. He reports current alcohol use. He reports that he does not use drugs.  Family History:   Family History  Problem Relation Age of Onset   Hypertension Mother    Diabetes Sister    Colon cancer Sister    Diabetes Brother    Cancer Maternal Aunt    Amblyopia Neg Hx    Blindness Neg Hx    Cataracts Neg Hx    Glaucoma Neg Hx    Macular degeneration Neg Hx    Strabismus Neg Hx    Retinal detachment Neg Hx    Retinitis pigmentosa Neg Hx     Blood pressure 135/61, pulse 94, temperature 98.7 F (37.1 C), temperature source Oral, resp. rate (!) 29, height 5' 11" (1.803 m), weight (!) 146.5 kg, SpO2 97 %. General: Well nourished in no acute distress.  Alert and oriented x3. Cardiovascular: Regular rate and rhythm with no rubs or gallops.  No thyromegaly or JVD noted.  Trace lower extremity edema bilaterally. Respiratory: Clear to auscultation with no wheezes or rales.  Abdomen: Soft nontender nondistended with normal bowel sounds x4 quadrants. Muskuloskeletal: No cyanosis or  clubbing.  Trace edema noted bilaterally Neuro: CN II-XII intact, strength, sensation, reflexes Skin: No ulcerative lesions noted or rashes Access: rt BCF, aneurysmal and puffy bruit at inflow limb.    Dwana Melena, MD 11/10/2021, 7:46 AM

## 2021-11-10 NOTE — ED Provider Notes (Signed)
[facility] EMERGENCY DEPARTMENT Provider Note   CSN: [id number] Arrival date & time: 11/09/21  1625     History  Chief Complaint  Patient presents with   Chest Pain    Adam Dennis is a 61 y.o. male.  The history is provided by the patient.  Chest Pain Pain location:  L chest Pain quality: sharp   Pain radiates to:  Does not radiate Pain severity:  Moderate Onset quality:  Gradual Duration: hours. Timing:  Constant Progression:  Resolved Chronicity:  New Context: at rest   Relieved by:  Nothing Worsened by:  Nothing Ineffective treatments:  None tried Associated symptoms: no diaphoresis, no fever, no lower extremity edema, no palpitations, no PND, no vomiting and no weakness   Risk factors: male sex   Patient with HTN and ESRD and MI with Chest pain at rest.      Past Medical History:  Diagnosis Date   Arthritis    "back" (02/06/2017)   CHF (congestive heart failure) ([place])    new onset /Encounter Date 09/12/2006   Chronic combined systolic and diastolic heart failure (HCC)    CKD (chronic kidney disease) stage 4, GFR 15-29 ml/min (HCC)    COPD (chronic obstructive pulmonary disease) (HCC)    End stage renal disease on dialysis [facility])    Tues/ Thur/ Sat [address]   Glaucoma, right eye    High cholesterol    Hypertension    Hypertrophy of tonsils alone    Myocardial infarction ([place])    "small one; ~ 2008" (02/06/2017)   Obesity, unspecified    On home oxygen therapy    "4L; 24/7" (04/10/2017)   OSA on CPAP    Type II diabetes mellitus ([place])      Home Medications Prior to Admission medications   Medication Sig Start Date End Date Taking? Authorizing Provider  amLODipine (NORVASC) 5 MG tablet TAKE 1 TABLET(5 MG) BY MOUTH DAILY Patient taking differently: Take 5 mg by mouth daily. TAKE 1 TABLET(5 MG) BY MOUTH DAILY 05/29/21  Yes Charlott Rakes, MD  aspirin EC 81 MG tablet Take 81 mg by mouth daily.   Yes [provider]   atorvastatin (LIPITOR) 40 MG tablet TAKE 1 TABLET(40 MG) BY MOUTH DAILY Patient taking differently: Take 40 mg by mouth daily. 05/29/21  Yes Charlott Rakes, MD  calcium carbonate (TUMS - DOSED IN MG ELEMENTAL CALCIUM) 500 MG chewable tablet Chew 1 tablet (200 mg of elemental calcium total) by mouth 2 (two) times daily. Patient taking differently: Chew 500 mg by mouth 2 (two) times daily. 11/27/14  Yes Dixie Dials, MD  Doxercalciferol (HECTOROL IV) Doxercalciferol (Hectorol) 07/31/21 07/30/22 Yes [provider]  FEROSUL 325 (65 Fe) MG tablet TAKE 1 TABLET(325 MG) BY MOUTH DAILY WITH BREAKFAST Patient taking differently: Take 325 mg by mouth daily with breakfast. 04/11/20  Yes Charlott Rakes, MD  Multiple Vitamin (MULTIVITAMIN ADULT) TABS Take 1 tablet by mouth daily. Patient taking differently: Take 1 tablet by mouth at bedtime. 08/11/19  Yes Charlott Rakes, MD  sevelamer carbonate (RENVELA) 800 MG tablet Take by mouth See admin instructions. Take 2 tablets po three times daily with meals and take 1 tablet twice a day as needed with snacks 10/30/21  Yes [provider]  timolol (TIMOPTIC-XR) 0.5 % ophthalmic gel-forming Place 1 drop into both eyes daily.  10/31/18  Yes [provider]  TRAVATAN Z 0.004 % SOLN ophthalmic solution Place 1 drop into both eyes at bedtime. 11/01/15  Yes [provider]  ACCU-CHEK SOFTCLIX LANCETS lancets Use as instructed to check blood sugar up to 3 times daily. 06/19/18   Charlott Rakes, MD  Blood Glucose Monitoring Suppl (ACCU-CHEK AVIVA) device Use as instructed to check blood sugar up to 3 times daily. 06/19/18   Charlott Rakes, MD  Dulaglutide (TRULICITY) 1.5 JG/8.1LX SOPN Inject 1.5 mg into the skin once a week. 08/20/21   Charlott Rakes, MD  glucose blood (ACCU-CHEK AVIVA PLUS) test strip Use as instructed to check blood sugar up to 3 times daily. 06/19/18   Charlott Rakes, MD  Lancet Devices [facility]) lancets Use as  instructed daily. 05/14/17   Charlott Rakes, MD  Na Sulfate-K Sulfate-Mg Sulf (SUPREP BOWEL PREP KIT) 17.5-3.13-1.6 GM/177ML SOLN Take 1 kit by mouth as directed. Patient not taking: Reported on 11/09/2021 10/26/21   Milus Banister, MD  nitroGLYCERIN (NITROSTAT) 0.4 MG SL tablet PLACE 1 TABLET BY MOUTH UNDER TONGUE EVERY 5 MINUTES FOR 3 DOSES AS NEEDED FOR CHEST PAIN Patient taking differently: Place 0.4 mg under the tongue every 5 (five) minutes as needed for chest pain. 01/29/19   Charlott Rakes, MD      Allergies    Patient has no known allergies.    Review of Systems   Review of Systems  Constitutional:  Negative for diaphoresis and fever.  HENT:  Negative for facial swelling.   Eyes:  Negative for redness.  Respiratory:  Negative for stridor.   Cardiovascular:  Positive for chest pain. Negative for palpitations and PND.  Gastrointestinal:  Negative for vomiting.  Neurological:  Negative for weakness.  All other systems reviewed and are negative.   Physical Exam Updated Vital Signs BP 133/82   Pulse (!) 49   Temp 97.6 F (36.4 C) (Oral)   Resp (!) 25   Ht 5' 11" (1.803 m)   Wt (!) 146.5 kg   SpO2 92%   BMI 45.05 kg/m  Physical Exam Vitals and nursing note reviewed. Exam conducted with a chaperone present.  Constitutional:      General: He is not in acute distress.    Appearance: He is well-developed. He is not diaphoretic.  HENT:     Head: Normocephalic and atraumatic.     Nose: Nose normal.  Eyes:     Conjunctiva/sclera: Conjunctivae normal.     Pupils: Pupils are equal, round, and reactive to light.     Comments: Normal appearance  Cardiovascular:     Rate and Rhythm: Normal rate and regular rhythm.     Pulses: Normal pulses.     Heart sounds: Normal heart sounds.  Pulmonary:     Effort: Pulmonary effort is normal. No respiratory distress.     Breath sounds: Normal breath sounds.  Abdominal:     General: Bowel sounds are normal. There is no distension.      Palpations: Abdomen is soft. There is no mass.     Tenderness: There is no abdominal tenderness. There is no guarding or rebound.  Genitourinary:    Comments: No CVA tenderness Musculoskeletal:        General: Normal range of motion.     Cervical back: Normal range of motion.  Skin:    General: Skin is warm and dry.     Capillary Refill: Capillary refill takes less than 2 seconds.     Findings: No rash.  Neurological:     General: No focal deficit present.     Mental Status: He is alert and oriented  to person, place, and time.     Deep Tendon Reflexes: Reflexes normal.  Psychiatric:        Mood and Affect: Mood normal.        Behavior: Behavior normal.     ED Results / Procedures / Treatments   Labs (all labs ordered are listed, but only abnormal results are displayed) Results for orders placed or performed during the hospital encounter of 11/09/21  SARS Coronavirus 2 by RT PCR (hospital order, performed in [facility] hospital lab) *cepheid single result test* Anterior Nasal Swab   Specimen: Anterior Nasal Swab  Result Value Ref Range   SARS Coronavirus 2 by RT PCR NEGATIVE NEGATIVE  Basic metabolic panel  Result Value Ref Range   Sodium 136 135 - 145 mmol/L   Potassium 4.4 3.5 - 5.1 mmol/L   Chloride 93 (L) 98 - 111 mmol/L   CO2 29 22 - 32 mmol/L   Glucose, Bld 90 70 - 99 mg/dL   BUN 27 (H) 6 - 20 mg/dL   Creatinine, Ser 8.70 (H) 0.61 - 1.24 mg/dL   Calcium 9.1 8.9 - 10.3 mg/dL   GFR, Estimated 6 (L) >60 mL/min   Anion gap 14 5 - 15  CBC  Result Value Ref Range   WBC 9.4 4.0 - 10.5 K/uL   RBC 4.16 (L) 4.22 - 5.81 MIL/uL   Hemoglobin 12.2 (L) 13.0 - 17.0 g/dL   HCT 39.4 39.0 - 52.0 %   MCV 94.7 80.0 - 100.0 fL   MCH 29.3 26.0 - 34.0 pg   MCHC 31.0 30.0 - 36.0 g/dL   RDW 16.6 (H) 11.5 - 15.5 %   Platelets 248 150 - 400 K/uL   nRBC 0.0 0.0 - 0.2 %  Troponin I (High Sensitivity)  Result Value Ref Range   Troponin I (High Sensitivity) 40 (H) <18 ng/L  Troponin I  (High Sensitivity)  Result Value Ref Range   Troponin I (High Sensitivity) 43 (H) <18 ng/L   CT Angio Chest PE W and/or Wo Contrast  Result Date: 11/10/2021 CLINICAL DATA:  Chest pain and shortness of breath. EXAM: CT ANGIOGRAPHY CHEST WITH CONTRAST TECHNIQUE: Multidetector CT imaging of the chest was performed using the standard protocol during bolus administration of intravenous contrast. Multiplanar CT image reconstructions and MIPs were obtained to evaluate the vascular anatomy. RADIATION DOSE REDUCTION: This exam was performed according to the departmental dose-optimization program which includes automated exposure control, adjustment of the mA and/or kV according to patient size and/or use of iterative reconstruction technique. CONTRAST:  34m OMNIPAQUE IOHEXOL 350 MG/ML SOLN, 580mOMNIPAQUE IOHEXOL 350 MG/ML SOLN COMPARISON:  Chest CT dated 09/12/2006 and radiograph dated 11/09/2021. FINDINGS: Evaluation of this exam is limited due to respiratory motion artifact. Cardiovascular: There is no cardiomegaly or pericardial effusion. The thoracic aorta is unremarkable. The origins of the great vessels of the aortic arch appear patent. There is dilatation of the main pulmonary trunk suggestive of pulmonary hypertension. Evaluation of the pulmonary arteries is limited due to respiratory motion. No large or central pulmonary artery embolus identified. Mediastinum/Nodes: No hilar or mediastinal adenopathy. The esophagus is grossly unremarkable. And symmetric enlargement of the left thyroid lobe, likely secondary to a 3 cm nodule. Recommend thyroid [place]: J Am Coll Radiol. 2015 Feb;12(2): 143-50).No mediastinal fluid collection. Lungs/Pleura: There is eventration of the diaphragms, left greater than right with left lung base atelectasis. No consolidative changes. There is no pleural effusion or pneumothorax. The central airways are patent.  Upper Abdomen: No acute abnormality. Musculoskeletal: Degenerative  changes of the spine. No acute osseous pathology. Review of the MIP images confirms the above findings. IMPRESSION: 1. No acute intrathoracic pathology. No CT evidence of central pulmonary artery embolus. 2. Evidence of pulmonary arterial hypertension. 3. Eventration of the diaphragms, left greater than right with left lung base atelectasis. 4. A 3 cm left thyroid lobe nodule. Further evaluation with ultrasound recommended. Electronically Signed   By: Anner Crete M.D.   On: 11/10/2021 02:40   DG Chest 2 View  Result Date: 11/09/2021 CLINICAL DATA:  Intermittent chest pain and facial pain starting last night. Nonsmoker. EXAM: CHEST - 2 VIEW COMPARISON:  07/01/2019 FINDINGS: Shallow inspiration with atelectasis in the lung bases. Cardiac enlargement with mild vascular congestion. No airspace disease or consolidation. No pleural effusions. No pneumothorax. Mediastinal contours appear intact. Degenerative changes in the spine. Vascular graft over the right axillary region. Gas-filled colon without distention may indicate ileus. IMPRESSION: 1. Shallow inspiration with linear atelectasis in the lung bases. 2. Cardiac enlargement with mild vascular congestion. Electronically Signed   By: Lucienne Capers M.D.   On: 11/09/2021 19:16     EKG EKG Interpretation  Date/Time:  Friday November 09 2021 18:13:52 EDT Ventricular Rate:  99 PR Interval:  136 QRS Duration: 86 QT Interval:  342 QTC Calculation: 438 R Axis:   26 Text Interpretation: Normal sinus rhythm Confirmed by Randal Buba, Hannah Crill ([id number]) on 11/09/2021 11:12:51 PM  Radiology CT Angio Chest PE W and/or Wo Contrast  Result Date: 11/10/2021 CLINICAL DATA:  Chest pain and shortness of breath. EXAM: CT ANGIOGRAPHY CHEST WITH CONTRAST TECHNIQUE: Multidetector CT imaging of the chest was performed using the standard protocol during bolus administration of intravenous contrast. Multiplanar CT image reconstructions and MIPs were obtained to evaluate the  vascular anatomy. RADIATION DOSE REDUCTION: This exam was performed according to the departmental dose-optimization program which includes automated exposure control, adjustment of the mA and/or kV according to patient size and/or use of iterative reconstruction technique. CONTRAST:  29m OMNIPAQUE IOHEXOL 350 MG/ML SOLN, 594mOMNIPAQUE IOHEXOL 350 MG/ML SOLN COMPARISON:  Chest CT dated 09/12/2006 and radiograph dated 11/09/2021. FINDINGS: Evaluation of this exam is limited due to respiratory motion artifact. Cardiovascular: There is no cardiomegaly or pericardial effusion. The thoracic aorta is unremarkable. The origins of the great vessels of the aortic arch appear patent. There is dilatation of the main pulmonary trunk suggestive of pulmonary hypertension. Evaluation of the pulmonary arteries is limited due to respiratory motion. No large or central pulmonary artery embolus identified. Mediastinum/Nodes: No hilar or mediastinal adenopathy. The esophagus is grossly unremarkable. And symmetric enlargement of the left thyroid lobe, likely secondary to a 3 cm nodule. Recommend thyroid [place]: J Am Coll Radiol. 2015 Feb;12(2): 143-50).No mediastinal fluid collection. Lungs/Pleura: There is eventration of the diaphragms, left greater than right with left lung base atelectasis. No consolidative changes. There is no pleural effusion or pneumothorax. The central airways are patent. Upper Abdomen: No acute abnormality. Musculoskeletal: Degenerative changes of the spine. No acute osseous pathology. Review of the MIP images confirms the above findings. IMPRESSION: 1. No acute intrathoracic pathology. No CT evidence of central pulmonary artery embolus. 2. Evidence of pulmonary arterial hypertension. 3. Eventration of the diaphragms, left greater than right with left lung base atelectasis. 4. A 3 cm left thyroid lobe nodule. Further evaluation with ultrasound recommended. Electronically Signed   By: ArAnner Crete.D.    On: 11/10/2021 02:40   DG Chest 2  View  Result Date: 11/09/2021 CLINICAL DATA:  Intermittent chest pain and facial pain starting last night. Nonsmoker. EXAM: CHEST - 2 VIEW COMPARISON:  07/01/2019 FINDINGS: Shallow inspiration with atelectasis in the lung bases. Cardiac enlargement with mild vascular congestion. No airspace disease or consolidation. No pleural effusions. No pneumothorax. Mediastinal contours appear intact. Degenerative changes in the spine. Vascular graft over the right axillary region. Gas-filled colon without distention may indicate ileus. IMPRESSION: 1. Shallow inspiration with linear atelectasis in the lung bases. 2. Cardiac enlargement with mild vascular congestion. Electronically Signed   By: Lucienne Capers M.D.   On: 11/09/2021 19:16    Procedures Procedures    Medications Ordered in ED Medications  aspirin chewable tablet 324 mg (324 mg Oral Given 11/09/21 2353)  iohexol (OMNIPAQUE) 350 MG/ML injection 75 mL (75 mLs Intravenous Contrast Given 11/10/21 0225)  iohexol (OMNIPAQUE) 350 MG/ML injection 59 mL (59 mLs Intravenous Contrast Given 11/10/21 0225)    ED Course/ Medical Decision Making/ A&P                           Medical Decision Making Chest pain at rest   Amount and/or Complexity of Data Reviewed Independent Historian: spouse    Details: see above External Data Reviewed: notes.    Details: previous notes reviewed Labs: ordered.    Details: all labs reviewed: covid is negative.  normal sodium 136, normal potassium 4.4 elevated BUN 27 and creatinine 8.7 Normal white count 9.4.  Elevated troponin x 2 40 and 43. Radiology: ordered and independent interpretation performed.    Details: No PE on CTA by me ECG/medicine tests: ordered and independent interpretation performed. Decision-making details documented in ED Course.  Risk OTC drugs. Prescription drug management. Decision regarding hospitalization. Risk Details: Will need admission based on heart  score.      Final Clinical Impression(s) / ED Diagnoses Final diagnoses:  Chest pain, unspecified type  Elevated troponin I level  Thyroid nodule  The patient appears reasonably stabilized for admission considering the current resources, flow, and capabilities available in the ED at this time, and I doubt any other [facility] requiring further screening and/or treatment in the ED prior to admission.   Rx / DC Orders ED Discharge Orders     None         Oluwatomiwa Kinyon, MD 11/10/21 1610

## 2021-11-10 NOTE — Progress Notes (Signed)
Patient admitted early this morning for chest pain.  Patient seen and examined at bedside and plan of care discussed with him.  I have reviewed patient's medical records including this morning's H&P, vitals, labs and medications myself.  Nephrology has already been consulted.  I will consult cardiology.  Follow recommendations.

## 2021-11-11 ENCOUNTER — Observation Stay (HOSPITAL_COMMUNITY): Payer: Medicare HMO

## 2021-11-11 DIAGNOSIS — I10 Essential (primary) hypertension: Secondary | ICD-10-CM

## 2021-11-11 DIAGNOSIS — E669 Obesity, unspecified: Secondary | ICD-10-CM | POA: Diagnosis not present

## 2021-11-11 DIAGNOSIS — N186 End stage renal disease: Secondary | ICD-10-CM | POA: Diagnosis not present

## 2021-11-11 DIAGNOSIS — D638 Anemia in other chronic diseases classified elsewhere: Secondary | ICD-10-CM

## 2021-11-11 DIAGNOSIS — E7849 Other hyperlipidemia: Secondary | ICD-10-CM | POA: Diagnosis not present

## 2021-11-11 DIAGNOSIS — I25110 Atherosclerotic heart disease of native coronary artery with unstable angina pectoris: Secondary | ICD-10-CM | POA: Diagnosis not present

## 2021-11-11 DIAGNOSIS — Z992 Dependence on renal dialysis: Secondary | ICD-10-CM | POA: Diagnosis not present

## 2021-11-11 DIAGNOSIS — R079 Chest pain, unspecified: Secondary | ICD-10-CM | POA: Diagnosis not present

## 2021-11-11 DIAGNOSIS — E1169 Type 2 diabetes mellitus with other specified complication: Secondary | ICD-10-CM

## 2021-11-11 DIAGNOSIS — E041 Nontoxic single thyroid nodule: Secondary | ICD-10-CM | POA: Diagnosis not present

## 2021-11-11 DIAGNOSIS — E119 Type 2 diabetes mellitus without complications: Secondary | ICD-10-CM | POA: Diagnosis not present

## 2021-11-11 DIAGNOSIS — R0789 Other chest pain: Secondary | ICD-10-CM | POA: Diagnosis not present

## 2021-11-11 LAB — LIPID PANEL
Cholesterol: 136 mg/dL (ref 0–200)
HDL: 66 mg/dL (ref >40)
LDL Cholesterol: 49 mg/dL (ref 0–99)
Total CHOL/HDL Ratio: 2.1 RATIO
Triglycerides: 104 mg/dL (ref <150)
VLDL: 21 mg/dL (ref 0–40)

## 2021-11-11 MED ORDER — PERFLUTREN LIPID MICROSPHERE
1.0000 mL | INTRAVENOUS | Status: AC | PRN
  Administered 2021-11-11: 3.5 mL via INTRAVENOUS

## 2021-11-11 NOTE — Progress Notes (Signed)
Patient returned from [place] the ECHO completed

## 2021-11-11 NOTE — Progress Notes (Signed)
  Echocardiogram 2D Echocardiogram has been performed.  Adam Dennis 11/11/2021, 10:02 AM

## 2021-11-11 NOTE — Progress Notes (Signed)
TR[facility] night cross cover note:   I was notified by RN of the patient's request for resumption of his home QHS CPAP in the setting of a known history of obstructive sleep apnea.  I subsequently placed order for resumption of home CPAP QHS.      Babs Bertin, DO Hospitalist

## 2021-11-11 NOTE — Care Management Obs Status (Signed)
[facility] NOTIFICATION   Patient Details  Name: DUSHAWN PUSEY MRN: [id number] Date of Birth: March 21, 1961   Medicare Observation Status Notification Given:  Yes    Norina Buzzard, RN 11/11/2021, 10:40 AM

## 2021-11-11 NOTE — Consult Note (Signed)
Ref: Charlott Rakes, MD   Subjective:  Feeling better. VS stable. NSR. Awaiting NM myocardial perfusion stress test tomorrow. Echocardiogram with poor windows but preserved LV systolic function and grade I diastolic dysfunction.  Objective:  Vital Signs in the last 24 hours: Temp:  [97.6 F (36.4 C)-98.2 F (36.8 C)] 98.2 F (36.8 C) (06/18 0955) Pulse Rate:  [70-93] 93 (06/18 0955) Cardiac Rhythm: Normal sinus rhythm (06/18 0716) Resp:  [17-28] 26 (06/18 0955) BP: (107-150)/(68-100) 139/95 (06/18 0955) SpO2:  [94 %-100 %] 99 % (06/18 0955) Weight:  [148.6 kg-151.1 kg] 148.6 kg (06/17 1822)  Physical Exam: BP Readings from Last 1 Encounters:  11/11/21 (!) 139/95     Wt Readings from Last 1 Encounters:  11/10/21 (!) 148.6 kg    Weight change: 4.588 kg Body mass index is 45.69 kg/m. HEENT: [place]/AT, Eyes-Brown, PERL, EOMI, Conjunctiva-Pink, Sclera-Non-icteric Neck: No JVD, No bruit, Trachea midline. Lungs:  Clear, Bilateral. Cardiac:  Regular rhythm, normal S1 and S2, no S3. II/VI systolic murmur. Abdomen:  Soft, non-tender. BS present. Extremities:  Trace edema present. No cyanosis. No clubbing. CNS: AxOx3, Cranial nerves grossly intact, moves all 4 extremities.  Skin: Warm and dry.   Intake/Output from previous day: 06/17 0701 - 06/18 0700 In: 358 [P.O.:358] Out: -     Lab Results: BMET    Component Value Date/Time   NA 135 11/10/2021 0802   NA 136 11/09/2021 1816   NA 137 09/01/2019 0826   NA 141 03/24/2019 1017   NA 142 07/01/2018 1114   NA 145 (H) 03/04/2018 1131   K 4.5 11/10/2021 0802   K 4.4 11/09/2021 1816   K 4.4 09/01/2019 0826   CL 93 (L) 11/10/2021 0802   CL 93 (L) 11/09/2021 1816   CL 96 (L) 09/01/2019 0826   CO2 26 11/10/2021 0802   CO2 29 11/09/2021 1816   CO2 28 07/06/2019 0754   GLUCOSE 96 11/10/2021 0802   GLUCOSE 90 11/09/2021 1816   GLUCOSE 91 09/01/2019 0826   BUN 30 (H) 11/10/2021 0802   BUN 27 (H) 11/09/2021 1816   BUN 30 (H)  09/01/2019 0826   BUN 36 (H) 03/24/2019 1017   BUN 32 (H) 07/01/2018 1114   BUN 38 (H) 03/04/2018 1131   CREATININE 9.81 (H) 11/10/2021 0802   CREATININE 9.49 (H) 11/10/2021 0318   CREATININE 8.70 (H) 11/09/2021 1816   CREATININE 2.89 (H) 11/30/2015 1250   CREATININE 3.06 (H) 09/28/2015 1746   CREATININE 2.29 (H) 06/26/2015 1047   CALCIUM 9.3 11/10/2021 0802   CALCIUM 9.1 11/09/2021 1816   CALCIUM 8.6 (L) 07/06/2019 0754   GFRNONAA 6 (L) 11/10/2021 0802   GFRNONAA 6 (L) 11/10/2021 0318   GFRNONAA 6 (L) 11/09/2021 1816   GFRNONAA 24 (L) 11/30/2015 1250   GFRNONAA 22 (L) 09/28/2015 1746   GFRNONAA 59 (L) 12/12/2014 1009   GFRAA 8 (L) 07/06/2019 0754   GFRAA 12 (L) 07/04/2019 1823   GFRAA 12 (L) 07/03/2019 0719   GFRAA 27 (L) 11/30/2015 1250   GFRAA 26 (L) 09/28/2015 1746   GFRAA 68 12/12/2014 1009   CBC    Component Value Date/Time   WBC 7.4 11/10/2021 0318   RBC 3.93 (L) 11/10/2021 0318   HGB 11.6 (L) 11/10/2021 0318   HGB 12.8 (L) 03/24/2019 1017   HCT 36.8 (L) 11/10/2021 0318   HCT 39.7 03/24/2019 1017   PLT 246 11/10/2021 0318   PLT 191 03/24/2019 1017   MCV 93.6 11/10/2021 0318  MCV 87 03/24/2019 1017   MCH 29.5 11/10/2021 0318   MCHC 31.5 11/10/2021 0318   RDW 16.3 (H) 11/10/2021 0318   RDW 13.9 03/24/2019 1017   LYMPHSABS 1.3 03/24/2019 1017   MONOABS 0.8 04/28/2017 1236   EOSABS 0.0 03/24/2019 1017   BASOSABS 0.0 03/24/2019 1017   HEPATIC Function Panel Recent Labs    11/10/21 0802  ALBUMIN 3.0*   HEMOGLOBIN A1C Lab Results  Component Value Date   MPG 131.24 06/30/2019   CARDIAC ENZYMES Lab Results  Component Value Date   TROPONINI <0.03 02/06/2017   TROPONINI <0.03 02/06/2017   TROPONINI <0.03 02/06/2017   BNP No results for input(s): "PROBNP" in the last 8760 hours. TSH No results for input(s): "TSH" in the last 8760 hours. CHOLESTEROL Recent Labs    08/20/21 1056 11/11/21 0057  CHOL 146 136    Scheduled Meds:  amLODipine  5 mg  Oral Daily   aspirin EC  81 mg Oral Daily   atorvastatin  40 mg Oral Daily   Chlorhexidine Gluconate Cloth  6 each Topical Q0600   heparin  5,000 Units Subcutaneous Q8H   latanoprost  1 drop Both Eyes QHS   sevelamer carbonate  800 mg Oral TID WC   timolol  1 drop Both Eyes Daily   Continuous Infusions:  PRN Meds:.HYDROmorphone (DILAUDID) injection, naLOXone (NARCAN)  injection, perflutren lipid microspheres (DEFINITY) IV suspension  Assessment/Plan: Acute coronary syndrome HTN HLD Type 2 DM Morbid obesity ESRD  Plan: Awaiting Nuclear stress test.   LOS: 0 days   Time spent including chart review, lab review, examination, discussion with patient/Family : 30 min   Dixie Dials  MD  11/11/2021, 11:16 AM

## 2021-11-11 NOTE — Progress Notes (Signed)
Pt request CPAP at hs as he uses at home.  Paged Dr. [place] Crane.  Waiting response.

## 2021-11-11 NOTE — Plan of Care (Signed)

## 2021-11-11 NOTE — Progress Notes (Signed)
PROGRESS NOTE    Adam Dennis  RDE:[id number] DOB: 1960-07-31 DOA: 11/09/2021 PCP: Charlott Rakes, MD   Brief Narrative:  61 y.o. male with medical history significant for ESRD on hemodialysis, prior MI, essential hypertension, hyperlipidemia, type 2 diabetes presented with chest pain.  On presentation, CTA chest was negative for PE.  High-sensitivity troponins were 40 and 43.  Cardiology and nephrology were consulted.  Assessment & Plan:   Chest pain, present on admission Elevated troponins -Presented with chest pain.  High-sensitivity troponins were 40 and 43.  CTA chest was negative for PE. -Currently on aspirin and statin. -Cardiology following: 2D echo and stress test pending. -Currently chest pain-free.  End-stage renal disease on hemodialysis -Nephrology following.  Dialysis as per nephrology schedule.  Had dialysis yesterday.  Essential hypertension -Blood pressure stable.  Continue amlodipine  Hyperlipidemia -Continue statin  Anemia of chronic disease -From renal failure  Morbid obesity -Outpatient follow-up   DVT prophylaxis: Heparin Code Status: Full Family Communication: Significant other at bedside Disposition Plan: Status is: Observation The patient will require care spanning > 2 midnights and should be moved to inpatient because: Of severity of illness and need for cardiac procedures    Consultants: Cardiology/nephrology  Procedures: None  Antimicrobials: None   Subjective: Patient seen and examined at bedside.  Denies any current chest pain, nausea, vomiting, shortness of breath.  Objective: Vitals:   11/10/21 1931 11/10/21 2324 11/11/21 0454 11/11/21 0955  BP: 107/71 138/89 131/75 (!) 139/95  Pulse: 87 85 90 93  Resp: 17 (!) 23 20 (!) 26  Temp: 97.6 F (36.4 C) 97.6 F (36.4 C) 98.1 F (36.7 C) 98.2 F (36.8 C)  TempSrc: Oral Oral Oral Oral  SpO2: 94% 97% 95% 99%  Weight:      Height:        Intake/Output Summary (Last 24  hours) at 11/11/2021 1028 Last data filed at 11/10/2021 1200 Gross per 24 hour  Intake 240 ml  Output --  Net 240 ml   Filed Weights   11/09/21 2324 11/10/21 1355 11/10/21 1822  Weight: (!) 146.5 kg (!) 151.1 kg (!) 148.6 kg    Examination:  General exam: Appears calm and comfortable.  Currently on room air. Respiratory system: Bilateral decreased breath sounds at bases Cardiovascular system: S1 & S2 heard, Rate controlled Gastrointestinal system: Abdomen is morbidly obese, nondistended, soft and nontender. Normal bowel sounds heard. Extremities: No cyanosis, clubbing; trace lower extremity edema present Central nervous system: Alert and oriented. No focal neurological deficits. Moving extremities Skin: No rashes, lesions or ulcers Psychiatry: Judgement and insight appear normal. Mood & affect appropriate.     Data Reviewed: I have personally reviewed following labs and imaging studies  CBC: Recent Labs  Lab 11/09/21 1816 11/10/21 0318  WBC 9.4 7.4  HGB 12.2* 11.6*  HCT 39.4 36.8*  MCV 94.7 93.6  PLT 248 631   Basic Metabolic Panel: Recent Labs  Lab 11/09/21 1816 11/10/21 0318 11/10/21 0802  NA 136  --  135  K 4.4  --  4.5  CL 93*  --  93*  CO2 29  --  26  GLUCOSE 90  --  96  BUN 27*  --  30*  CREATININE 8.70* 9.49* 9.81*  CALCIUM 9.1  --  9.3  PHOS  --   --  7.2*   GFR: Estimated Creatinine Clearance: 12 mL/min (A) (by C-G formula based on SCr of 9.81 mg/dL (H)). Liver Function Tests: Recent Labs  Lab 11/10/21 [phone]  ALBUMIN 3.0*   No results for input(s): "LIPASE", "AMYLASE" in the last 168 hours. No results for input(s): "AMMONIA" in the last 168 hours. Coagulation Profile: No results for input(s): "INR", "PROTIME" in the last 168 hours. Cardiac Enzymes: No results for input(s): "CKTOTAL", "CKMB", "CKMBINDEX", "TROPONINI" in the last 168 hours. BNP (last 3 results) No results for input(s): "PROBNP" in the last 8760 hours. HbA1C: No results for  input(s): "HGBA1C" in the last 72 hours. CBG: No results for input(s): "GLUCAP" in the last 168 hours. Lipid Profile: Recent Labs    11/11/21 0057  CHOL 136  HDL 66  LDLCALC 49  TRIG 104  CHOLHDL 2.1   Thyroid Function Tests: No results for input(s): "TSH", "T4TOTAL", "FREET4", "T3FREE", "THYROIDAB" in the last 72 hours. Anemia Panel: No results for input(s): "VITAMINB12", "FOLATE", "FERRITIN", "TIBC", "IRON", "RETICCTPCT" in the last 72 hours. Sepsis Labs: No results for input(s): "PROCALCITON", "LATICACIDVEN" in the last 168 hours.  Recent Results (from the past 240 hour(s))  SARS Coronavirus 2 by RT PCR (hospital order, performed in [facility] hospital lab) *cepheid single result test* Anterior Nasal Swab     Status: None   Collection Time: 11/09/21 11:31 PM   Specimen: Anterior Nasal Swab  Result Value Ref Range Status   SARS Coronavirus 2 by RT PCR NEGATIVE NEGATIVE Final    Comment: (NOTE) SARS-CoV-2 target nucleic acids are NOT DETECTED.  The SARS-CoV-2 RNA is generally detectable in upper and lower respiratory specimens during the acute phase of infection. The lowest concentration of SARS-CoV-2 viral copies this assay can detect is 250 copies / mL. A negative result does not preclude SARS-CoV-2 infection and should not be used as the sole basis for treatment or other patient management decisions.  A negative result may occur with improper specimen collection / handling, submission of specimen other than nasopharyngeal swab, presence of viral mutation(s) within the areas targeted by this assay, and inadequate number of viral copies (<250 copies / mL). A negative result must be combined with clinical observations, patient history, and epidemiological information.  Fact Sheet for Patients:   https://www.patel.info/  Fact Sheet for Healthcare Providers: https://hall.com/  This test is not yet approved or  cleared by the  [place] FDA and has been authorized for detection and/or diagnosis of SARS-CoV-2 by FDA under an Emergency Use Authorization (EUA).  This EUA will remain in effect (meaning this test can be used) for the duration of the COVID-19 declaration under Section 564(b)(1) of the Act, 21 U.S.C. section 360bbb-3(b)(1), unless the authorization is terminated or revoked sooner.  Performed at [facility], [place] [address] [place], [place] [address]   MRSA Next Gen by PCR, Nasal     Status: None   Collection Time: 11/10/21  5:06 AM   Specimen: Nasal Mucosa; Nasal Swab  Result Value Ref Range Status   MRSA by PCR Next Gen NOT DETECTED NOT DETECTED Final    Comment: (NOTE) The GeneXpert MRSA Assay (FDA approved for NASAL specimens only), is one component of a comprehensive MRSA colonization surveillance program. It is not intended to diagnose MRSA infection nor to guide or monitor treatment for MRSA infections. Test performance is not FDA approved in patients less than 68 years old. Performed at [facility], [place] [address], [place], [place] [address]          Radiology Studies: CT Angio Chest PE W and/or Wo Contrast  Result Date: 11/10/2021 CLINICAL DATA:  Chest pain and shortness  of breath. EXAM: CT ANGIOGRAPHY CHEST WITH CONTRAST TECHNIQUE: Multidetector CT imaging of the chest was performed using the standard protocol during bolus administration of intravenous contrast. Multiplanar CT image reconstructions and MIPs were obtained to evaluate the vascular anatomy. RADIATION DOSE REDUCTION: This exam was performed according to the departmental dose-optimization program which includes automated exposure control, adjustment of the mA and/or kV according to patient size and/or use of iterative reconstruction technique. CONTRAST:  96m OMNIPAQUE IOHEXOL 350 MG/ML SOLN, 58mOMNIPAQUE IOHEXOL 350 MG/ML SOLN COMPARISON:  Chest CT dated 09/12/2006 and radiograph dated 11/09/2021.  FINDINGS: Evaluation of this exam is limited due to respiratory motion artifact. Cardiovascular: There is no cardiomegaly or pericardial effusion. The thoracic aorta is unremarkable. The origins of the great vessels of the aortic arch appear patent. There is dilatation of the main pulmonary trunk suggestive of pulmonary hypertension. Evaluation of the pulmonary arteries is limited due to respiratory motion. No large or central pulmonary artery embolus identified. Mediastinum/Nodes: No hilar or mediastinal adenopathy. The esophagus is grossly unremarkable. And symmetric enlargement of the left thyroid lobe, likely secondary to a 3 cm nodule. Recommend thyroid USK[place]aref: J Am Coll Radiol. 2015 Feb;12(2): 143-50).No mediastinal fluid collection. Lungs/Pleura: There is eventration of the diaphragms, left greater than right with left lung base atelectasis. No consolidative changes. There is no pleural effusion or pneumothorax. The central airways are patent. Upper Abdomen: No acute abnormality. Musculoskeletal: Degenerative changes of the spine. No acute osseous pathology. Review of the MIP images confirms the above findings. IMPRESSION: 1. No acute intrathoracic pathology. No CT evidence of central pulmonary artery embolus. 2. Evidence of pulmonary arterial hypertension. 3. Eventration of the diaphragms, left greater than right with left lung base atelectasis. 4. A 3 cm left thyroid lobe nodule. Further evaluation with ultrasound recommended. Electronically Signed   By: ArAnner Crete.D.   On: 11/10/2021 02:40   DG Chest 2 View  Result Date: 11/09/2021 CLINICAL DATA:  Intermittent chest pain and facial pain starting last night. Nonsmoker. EXAM: CHEST - 2 VIEW COMPARISON:  07/01/2019 FINDINGS: Shallow inspiration with atelectasis in the lung bases. Cardiac enlargement with mild vascular congestion. No airspace disease or consolidation. No pleural effusions. No pneumothorax. Mediastinal contours appear intact.  Degenerative changes in the spine. Vascular graft over the right axillary region. Gas-filled colon without distention may indicate ileus. IMPRESSION: 1. Shallow inspiration with linear atelectasis in the lung bases. 2. Cardiac enlargement with mild vascular congestion. Electronically Signed   By: WiLucienne Capers.D.   On: 11/09/2021 19:16        Scheduled Meds:  amLODipine  5 mg Oral Daily   aspirin EC  81 mg Oral Daily   atorvastatin  40 mg Oral Daily   Chlorhexidine Gluconate Cloth  6 each Topical Q0600   heparin  5,000 Units Subcutaneous Q8H   latanoprost  1 drop Both Eyes QHS   sevelamer carbonate  800 mg Oral TID WC   timolol  1 drop Both Eyes Daily   Continuous Infusions:        KsAline AugustMD [facility] 11/11/2021, 10:28 AM

## 2021-11-11 NOTE — Progress Notes (Signed)
[facility] Progress Note   61 y.o. male with medical history significant for ESRD HD TTS [facility]  with Dr. Posey Pronto, prior MI, essential hypertension, hyperlipidemia, type 2 diabetes, presented with complaints of nonexertional chest pain while sitting on the bus on his way home from work.  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  His chest pain was not similar to prior MI.  His last HD was on 6/15 and pt left at 146.8kg; prior treatment he left at 147 kg but prior week has reached his EDW. HST elevated but peaked at 43. Did not have any CP at time of consultation.   Dialysis orders Tok TTS, 4 hrs 15 min, Optiflux 200NRe, BFR 400, DFR Autoflow 1.5, EDW 145.5 (kg), Dialysate 2.0 K, 2.0 Ca, 1.0 Mg, 100 Dextrose (G2201), Sodium 137 (mEq/L), Bicarb Setting: 35 (mEq/L), UFR Profile: Profile 2, Sodium Model: None, Access: AVFistula Hect 66mg IV qtx, Heparin 10,200 Not on ESA.   Assessment/ Plan:   1) ESRD: volume overload.  Started HD on 07/01/2019 after placement of right IJ TDC but now has been receiving HD through a right arm BCF.     Left above EDW past 2 treatments at the outpt center but prior to that he had been reaching EDW; cramping one prior week and I wonder if it was bec of higher IDWG or EDW challenging protocol.  He tolerated HD on 6/17 with 2.5L net UF and only cramped when he returned to his room.   No absolute indication for RRT and the patient appears to be  comfortable. Next HD on Tuesday if he's still here.   2)   HTN: Restart home regimen and should improve with UF after dialysis.  Continue to monitor.     3)  Anemia of CKD: Hemoglobin at goal, no ESA needed.     4) Bone/ mineral: Phosphorus level was in range 09/2021; phos 7.2 now. On Hect 461m on HD. On [place] 1 TIDM + snacks and questionable compliance. Has been restarted.   5) h/o CHF: compensated   6) Chest  pain: serial troponins  Subjective:   Feels better. No further chest pain. Denies f/c/n/v but did have cramping when he returned to his room from dialysis yesterday.   Objective:   BP 131/75 (BP Location: Left Wrist)   Pulse 90   Temp 98.1 F (36.7 C) (Oral)   Resp 20   Ht '5\' 11"'$  (1.803 m)   Wt (!) 148.6 kg   SpO2 95%   BMI 45.69 kg/m   Intake/Output Summary (Last 24 hours) at 11/11/2021 0946 Last data filed at 11/10/2021 1200 Gross per 24 hour  Intake 240 ml  Output --  Net 240 ml   Weight change: 4.588 kg  Physical Exam: General: Well nourished NAD Cardiovascular: RRR  Respiratory: CTA b/l Abdomen: SDNT +BS Muskuloskeletal: No cyanosis or clubbing. Neuro: Strength, sensation intact Skin: No ulcerative lesions noted or rashes Access: rt BCF, aneurysmal and puffy bruit at inflow limb.  Imaging: CT Angio Chest PE W and/or Wo Contrast  Result Date: 11/10/2021 CLINICAL DATA:  Chest pain and shortness of breath. EXAM: CT ANGIOGRAPHY CHEST WITH CONTRAST TECHNIQUE: Multidetector CT imaging of the chest was performed using the standard protocol during bolus administration of intravenous contrast. Multiplanar CT image reconstructions and MIPs were obtained to evaluate the vascular anatomy. RADIATION DOSE REDUCTION: This exam was performed according  to the departmental dose-optimization program which includes automated exposure control, adjustment of the mA and/or kV according to patient size and/or use of iterative reconstruction technique. CONTRAST:  60m OMNIPAQUE IOHEXOL 350 MG/ML SOLN, 583mOMNIPAQUE IOHEXOL 350 MG/ML SOLN COMPARISON:  Chest CT dated 09/12/2006 and radiograph dated 11/09/2021. FINDINGS: Evaluation of this exam is limited due to respiratory motion artifact. Cardiovascular: There is no cardiomegaly or pericardial effusion. The thoracic aorta is unremarkable. The origins of the great vessels of the aortic arch appear patent. There is dilatation of the main pulmonary  trunk suggestive of pulmonary hypertension. Evaluation of the pulmonary arteries is limited due to respiratory motion. No large or central pulmonary artery embolus identified. Mediastinum/Nodes: No hilar or mediastinal adenopathy. The esophagus is grossly unremarkable. And symmetric enlargement of the left thyroid lobe, likely secondary to a 3 cm nodule. Recommend thyroid US[place]aref: J Am Coll Radiol. 2015 Feb;12(2): 143-50).No mediastinal fluid collection. Lungs/Pleura: There is eventration of the diaphragms, left greater than right with left lung base atelectasis. No consolidative changes. There is no pleural effusion or pneumothorax. The central airways are patent. Upper Abdomen: No acute abnormality. Musculoskeletal: Degenerative changes of the spine. No acute osseous pathology. Review of the MIP images confirms the above findings. IMPRESSION: 1. No acute intrathoracic pathology. No CT evidence of central pulmonary artery embolus. 2. Evidence of pulmonary arterial hypertension. 3. Eventration of the diaphragms, left greater than right with left lung base atelectasis. 4. A 3 cm left thyroid lobe nodule. Further evaluation with ultrasound recommended. Electronically Signed   By: ArAnner Crete.D.   On: 11/10/2021 02:40   DG Chest 2 View  Result Date: 11/09/2021 CLINICAL DATA:  Intermittent chest pain and facial pain starting last night. Nonsmoker. EXAM: CHEST - 2 VIEW COMPARISON:  07/01/2019 FINDINGS: Shallow inspiration with atelectasis in the lung bases. Cardiac enlargement with mild vascular congestion. No airspace disease or consolidation. No pleural effusions. No pneumothorax. Mediastinal contours appear intact. Degenerative changes in the spine. Vascular graft over the right axillary region. Gas-filled colon without distention may indicate ileus. IMPRESSION: 1. Shallow inspiration with linear atelectasis in the lung bases. 2. Cardiac enlargement with mild vascular congestion. Electronically Signed   By:  WiLucienne Capers.D.   On: 11/09/2021 19:16    Labs: BMET Recent Labs  Lab 11/09/21 1816 11/10/21 0318 11/10/21 0802  NA 136  --  135  K 4.4  --  4.5  CL 93*  --  93*  CO2 29  --  26  GLUCOSE 90  --  96  BUN 27*  --  30*  CREATININE 8.70* 9.49* 9.81*  CALCIUM 9.1  --  9.3  PHOS  --   --  7.2*   CBC Recent Labs  Lab 11/09/21 1816 11/10/21 0318  WBC 9.4 7.4  HGB 12.2* 11.6*  HCT 39.4 36.8*  MCV 94.7 93.6  PLT 248 246    Medications:     amLODipine  5 mg Oral Daily   aspirin EC  81 mg Oral Daily   atorvastatin  40 mg Oral Daily   Chlorhexidine Gluconate Cloth  6 each Topical Q0600   heparin  5,000 Units Subcutaneous Q8H   latanoprost  1 drop Both Eyes QHS   sevelamer carbonate  800 mg Oral TID WC   timolol  1 drop Both Eyes Daily      JaOtelia SanteeMD 11/11/2021, 9:46 AM

## 2021-11-12 ENCOUNTER — Observation Stay (HOSPITAL_COMMUNITY): Payer: Medicare HMO

## 2021-11-12 DIAGNOSIS — I208 Other forms of angina pectoris: Secondary | ICD-10-CM | POA: Diagnosis not present

## 2021-11-12 DIAGNOSIS — E1169 Type 2 diabetes mellitus with other specified complication: Secondary | ICD-10-CM | POA: Diagnosis not present

## 2021-11-12 DIAGNOSIS — E785 Hyperlipidemia, unspecified: Secondary | ICD-10-CM | POA: Diagnosis not present

## 2021-11-12 DIAGNOSIS — Z992 Dependence on renal dialysis: Secondary | ICD-10-CM | POA: Diagnosis not present

## 2021-11-12 DIAGNOSIS — E669 Obesity, unspecified: Secondary | ICD-10-CM | POA: Diagnosis not present

## 2021-11-12 DIAGNOSIS — R079 Chest pain, unspecified: Secondary | ICD-10-CM | POA: Diagnosis not present

## 2021-11-12 DIAGNOSIS — I10 Essential (primary) hypertension: Secondary | ICD-10-CM | POA: Diagnosis not present

## 2021-11-12 DIAGNOSIS — D638 Anemia in other chronic diseases classified elsewhere: Secondary | ICD-10-CM | POA: Diagnosis not present

## 2021-11-12 DIAGNOSIS — R0789 Other chest pain: Secondary | ICD-10-CM | POA: Diagnosis not present

## 2021-11-12 DIAGNOSIS — N186 End stage renal disease: Secondary | ICD-10-CM | POA: Diagnosis not present

## 2021-11-12 DIAGNOSIS — E119 Type 2 diabetes mellitus without complications: Secondary | ICD-10-CM | POA: Diagnosis not present

## 2021-11-12 DIAGNOSIS — R748 Abnormal levels of other serum enzymes: Secondary | ICD-10-CM | POA: Diagnosis not present

## 2021-11-12 LAB — RENAL FUNCTION PANEL
Albumin: 2.9 g/dL — ABNORMAL LOW (ref 3.5–5.0)
Anion gap: 18 — ABNORMAL HIGH (ref 5–15)
BUN: 39 mg/dL — ABNORMAL HIGH (ref 6–20)
CO2: 20 mmol/L — ABNORMAL LOW (ref 22–32)
Calcium: 8.8 mg/dL — ABNORMAL LOW (ref 8.9–10.3)
Chloride: 96 mmol/L — ABNORMAL LOW (ref 98–111)
Creatinine, Ser: 9.98 mg/dL — ABNORMAL HIGH (ref 0.61–1.24)
GFR, Estimated: 5 mL/min — ABNORMAL LOW (ref >60)
Glucose, Bld: 110 mg/dL — ABNORMAL HIGH (ref 70–99)
Phosphorus: 7.7 mg/dL — ABNORMAL HIGH (ref 2.5–4.6)
Potassium: 5.1 mmol/L (ref 3.5–5.1)
Sodium: 134 mmol/L — ABNORMAL LOW (ref 135–145)

## 2021-11-12 LAB — ECHOCARDIOGRAM COMPLETE
Area-P 1/2: 4.49 cm2
Height: 71 in
S' Lateral: 3.8 cm
Weight: 5241.66 oz

## 2021-11-12 LAB — CBC
HCT: 38.4 % — ABNORMAL LOW (ref 39.0–52.0)
Hemoglobin: 11.9 g/dL — ABNORMAL LOW (ref 13.0–17.0)
MCH: 29 pg (ref 26.0–34.0)
MCHC: 31 g/dL (ref 30.0–36.0)
MCV: 93.7 fL (ref 80.0–100.0)
Platelets: 227 10*3/uL (ref 150–400)
RBC: 4.1 MIL/uL — ABNORMAL LOW (ref 4.22–5.81)
RDW: 15.9 % — ABNORMAL HIGH (ref 11.5–15.5)
WBC: 5.3 10*3/uL (ref 4.0–10.5)
nRBC: 0 % (ref 0.0–0.2)

## 2021-11-12 MED ORDER — PENTAFLUOROPROP-TETRAFLUOROETH EX AERO: 1.0000 | INHALATION_SPRAY | CUTANEOUS | Status: DC | PRN

## 2021-11-12 MED ORDER — HEPARIN SODIUM (PORCINE) 1000 UNIT/ML DIALYSIS
1000.0000 [IU] | INTRAMUSCULAR | Status: DC | PRN
  Administered 2021-11-13: 10000 [IU] via INTRAVENOUS_CENTRAL
  Filled 2021-11-12 (×2): qty 1

## 2021-11-12 MED ORDER — HEPARIN SODIUM (PORCINE) 1000 UNIT/ML DIALYSIS
1000.0000 [IU] | INTRAMUSCULAR | Status: DC | PRN
  Filled 2021-11-12: qty 1

## 2021-11-12 MED ORDER — LIDOCAINE HCL (PF) 1 % IJ SOLN: 5.0000 mL | INTRAMUSCULAR | Status: DC | PRN

## 2021-11-12 MED ORDER — REGADENOSON 0.4 MG/5ML IV SOLN
0.4000 mg | Freq: Once | INTRAVENOUS | Status: AC
  Filled 2021-11-12: qty 5

## 2021-11-12 MED ORDER — TECHNETIUM TC 99M TETROFOSMIN IV KIT
31.1000 | PACK | Freq: Once | INTRAVENOUS | Status: AC | PRN
  Administered 2021-11-12: 31.1 via INTRAVENOUS

## 2021-11-12 MED ORDER — ALTEPLASE 2 MG IJ SOLR
2.0000 mg | Freq: Once | INTRAMUSCULAR | Status: DC | PRN
  Filled 2021-11-12: qty 2

## 2021-11-12 MED ORDER — CHLORHEXIDINE GLUCONATE CLOTH 2 % EX PADS: 6.0000 | MEDICATED_PAD | Freq: Every day | CUTANEOUS | Status: DC

## 2021-11-12 MED ORDER — LIDOCAINE-PRILOCAINE 2.5-2.5 % EX CREA
1.0000 | TOPICAL_CREAM | CUTANEOUS | Status: DC | PRN
Start: 2021-11-12 — End: 2021-11-14
  Filled 2021-11-12: qty 5

## 2021-11-12 NOTE — Progress Notes (Signed)
Subjective:  Doing well denies any further chest pain or shortness of breath.  Scheduled for nuclear stress test today 2-day protocol.  Objective:  Vital Signs in the last 24 hours: Temp:  [97.9 F (36.6 C)-98.3 F (36.8 C)] 97.9 F (36.6 C) (06/19 0358) Pulse Rate:  [75-93] 75 (06/19 0358) Resp:  [19-26] 19 (06/19 0358) BP: (139-157)/(81-95) 157/89 (06/19 0358) SpO2:  [93 %-100 %] 100 % (06/19 0358)  Intake/Output from previous day: No intake/output data recorded. Intake/Output from this shift: No intake/output data recorded.  Physical Exam: Neck: no adenopathy, no carotid bruit, no JVD, and supple, symmetrical, trachea midline Lungs: clear to auscultation bilaterally Heart: regular rate and rhythm, S1, S2 normal, and no pericardial rub Abdomen: soft, non-tender; bowel sounds normal; no masses,  no organomegaly Extremities: extremities normal, atraumatic, no cyanosis or edema  Lab Results: Recent Labs    11/09/21 1816 11/10/21 0318  WBC 9.4 7.4  HGB 12.2* 11.6*  PLT 248 246   Recent Labs    11/09/21 1816 11/10/21 0318 11/10/21 0802  NA 136  --  135  K 4.4  --  4.5  CL 93*  --  93*  CO2 29  --  26  GLUCOSE 90  --  96  BUN 27*  --  30*  CREATININE 8.70* 9.49* 9.81*   No results for input(s): "TROPONINI" in the last 72 hours.  Invalid input(s): "CK", "MB" Hepatic Function Panel Recent Labs    11/10/21 0802  ALBUMIN 3.0*   Recent Labs    11/11/21 0057  CHOL 136   No results for input(s): "PROTIME" in the last 72 hours.  Imaging: Imaging results have been reviewed and ECHOCARDIOGRAM COMPLETE  Result Date: 11/12/2021    ECHOCARDIOGRAM REPORT   Patient Name:   Adam Dennis Date of Exam: 11/11/2021 Medical Rec #:  [id number]       Height:       71.0 in Accession #:    [id number]      Weight:       327.6 lb Date of Birth:  Jun 11, 1960        BSA:          2.601 m Patient Age:    61 years        BP:           131/75 mmHg Patient Gender: M               HR:            89 bpm. Exam Location:  Inpatient Procedure: 2D Echo, Cardiac Doppler, Color Doppler and Intracardiac            Opacification Agent Indications:     R07.9* Chest pain, unspecified  History:         Patient has prior history of Echocardiogram examinations, most                  recent 07/01/2019. CHF, Previous Myocardial Infarction, COPD;                  Risk Factors:Diabetes and Hypertension.  Sonographer:     Bernadene Person RDCS Referring Phys:  [place] Diagnosing Phys: Charolette Forward MD  Sonographer Comments: Technically difficult study due to poor echo windows and patient is morbidly obese. Image acquisition challenging due to patient body habitus. IMPRESSIONS  1. Left ventricular ejection fraction, by estimation, is 50 to 55%. The left ventricle has normal function. The left ventricle has  no regional wall motion abnormalities. Left ventricular diastolic parameters are indeterminate.  2. Right ventricular systolic function is normal. The right ventricular size is normal.  3. Left atrial size was mildly dilated.  4. The mitral valve is normal in structure. Trivial mitral valve regurgitation.  5. The aortic valve is normal in structure. Aortic valve regurgitation is not visualized. Aortic valve sclerosis is present, with no evidence of aortic valve stenosis.  6. The inferior vena cava is normal in size with greater than 50% respiratory variability, suggesting right atrial pressure of 3 mmHg. FINDINGS  Left Ventricle: Left ventricular ejection fraction, by estimation, is 50 to 55%. The left ventricle has normal function. The left ventricle has no regional wall motion abnormalities. Definity contrast agent was given IV to delineate the left ventricular  endocardial borders. The left ventricular internal cavity size was normal in size. There is no left ventricular hypertrophy. Left ventricular diastolic parameters are indeterminate. Right Ventricle: The right ventricular size is normal. No increase in  right ventricular wall thickness. Right ventricular systolic function is normal. Left Atrium: Left atrial size was mildly dilated. Right Atrium: Right atrial size was normal in size. Pericardium: There is no evidence of pericardial effusion. Mitral Valve: The mitral valve is normal in structure. Trivial mitral valve regurgitation. Tricuspid Valve: The tricuspid valve is normal in structure. Tricuspid valve regurgitation is not demonstrated. Aortic Valve: The aortic valve is normal in structure. Aortic valve regurgitation is not visualized. Aortic valve sclerosis is present, with no evidence of aortic valve stenosis. Pulmonic Valve: The pulmonic valve was not well visualized. Pulmonic valve regurgitation is not visualized. Aorta: The aortic root is normal in size and structure. Venous: The inferior vena cava is normal in size with greater than 50% respiratory variability, suggesting right atrial pressure of 3 mmHg. IAS/Shunts: No atrial level shunt detected by color flow Doppler.  LEFT VENTRICLE PLAX 2D LVIDd:         5.40 cm   Diastology LVIDs:         3.80 cm   LV e' medial:    5.78 cm/s LV PW:         1.00 cm   LV E/e' medial:  9.3 LV IVS:        1.00 cm   LV e' lateral:   6.66 cm/s LVOT diam:     2.30 cm   LV E/e' lateral: 8.0 LV SV:         75 LV SV Index:   29 LVOT Area:     4.15 cm  LEFT ATRIUM             Index LA diam:        4.40 cm 1.69 cm/m LA Vol (A2C):   45.7 ml 17.57 ml/m LA Vol (A4C):   39.7 ml 15.27 ml/m LA Biplane Vol: 42.9 ml 16.50 ml/m  AORTIC VALVE LVOT Vmax:   111.00 cm/s LVOT Vmean:  71.300 cm/s LVOT VTI:    0.180 m  AORTA Ao Root diam: 3.70 cm Ao Asc diam:  3.70 cm MITRAL VALVE MV Area (PHT): 4.49 cm     SHUNTS MV Decel Time: 169 msec     Systemic VTI:  0.18 m MV E velocity: 53.60 cm/s   Systemic Diam: 2.30 cm MV A velocity: 105.00 cm/s MV E/A ratio:  0.51 Charolette Forward MD Electronically signed by Charolette Forward MD Signature Date/Time: 11/12/2021/7:04:30 AM    Final     Cardiac  Studies:  Assessment/Plan:  Acute coronary syndrome Minimally elevated high-sensitivity troponin I secondary to demand ischemia/renal failure doubt significant MI HTN HLD Type 2 DM Morbid obesity Obstructive sleep apnea/obesity hypoventilation syndrome on CPAP ESRD dialysis Plan Continue present management Scheduled for nuclear stress test  LOS: 0 days    Charolette Forward 11/12/2021, 7:09 AM

## 2021-11-12 NOTE — Progress Notes (Signed)
Back from nuclear med for first part of stress test.

## 2021-11-12 NOTE — Progress Notes (Signed)
Nuclear med . Called claiming that they gonna do  the resting test today and pt. could eat. Tomorrow they will do the stress test at 830  am till 10 am. Coordinated with hemodialysis dept to schedule the HD after 10 am tom. as pt is scheduled T-TH-sat.Marland Kitchen Pt made aware.

## 2021-11-12 NOTE — Progress Notes (Signed)
Placed patient on CPAP for the night with oxygen set at 5lpm  

## 2021-11-12 NOTE — Progress Notes (Signed)
PROGRESS NOTE    Adam Dennis  DDU:[id number] DOB: 22-Nov-1960 DOA: 11/09/2021 PCP: Charlott Rakes, MD   Brief Narrative:  61 y.o. male with medical history significant for ESRD on hemodialysis, prior MI, essential hypertension, hyperlipidemia, type 2 diabetes presented with chest pain.  On presentation, CTA chest was negative for PE.  High-sensitivity troponins were 40 and 43.  Cardiology and nephrology were consulted.  Assessment & Plan:   Chest pain, present on admission Elevated troponins -Presented with chest pain.  High-sensitivity troponins were 40 and 43.  CTA chest was negative for PE. -Currently on aspirin and statin. -Cardiology following: 2D echo showed EF of 50 to 55%.  Stress test is pending. -Currently chest pain-free.  End-stage renal disease on hemodialysis -Nephrology following.  Dialysis as per nephrology schedule.  Had dialysis yesterday.  Essential hypertension -Blood pressure stable.  Continue amlodipine  Hyperlipidemia -Continue statin  Anemia of chronic disease -From renal failure  Morbid obesity -Outpatient follow-up  Left mid thyroid nodule -Will need outpatient biopsy.  Follow-up with PCP regarding arrangement of the same   DVT prophylaxis: Heparin Code Status: Full Family Communication: Significant other at bedside Disposition Plan: Status is: Observation The patient will require care spanning > 2 midnights and should be moved to inpatient because: Of severity of illness and need for cardiac procedures    Consultants: Cardiology/nephrology  Procedures: None  Antimicrobials: None   Subjective: Patient seen and examined at bedside.  Denies any current chest pain, nausea, vomiting or shortness of breath. Objective: Vitals:   11/11/21 2046 11/11/21 2339 11/12/21 0358 11/12/21 0734  BP:  (!) 151/84 (!) 157/89 140/80  Pulse: 90 80 75 80  Resp: '20 20 19 16  '$ Temp:  98.1 F (36.7 C) 97.9 F (36.6 C) (!) 97.3 F (36.3 C)  TempSrc:   Axillary Axillary Axillary  SpO2: 95% 100% 100% 100%  Weight:      Height:       No intake or output data in the 24 hours ending 11/12/21 1005  Filed Weights   11/09/21 2324 11/10/21 1355 11/10/21 1822  Weight: (!) 146.5 kg (!) 151.1 kg (!) 148.6 kg    Examination:  General: On room air.  No distress.  respiratory: Decreased breath sounds at bases bilaterally with some crackles CVS: Currently rate controlled; S1-S2 heard  abdominal: Soft, morbidly obese, nontender, slightly distended, no organomegaly; normal bowel sounds are heard  extremities: Bilateral lower extremity edema present; no clubbing.       Data Reviewed: I have personally reviewed following labs and imaging studies  CBC: Recent Labs  Lab 11/09/21 1816 11/10/21 0318  WBC 9.4 7.4  HGB 12.2* 11.6*  HCT 39.4 36.8*  MCV 94.7 93.6  PLT 248 237    Basic Metabolic Panel: Recent Labs  Lab 11/09/21 1816 11/10/21 0318 11/10/21 0802  NA 136  --  135  K 4.4  --  4.5  CL 93*  --  93*  CO2 29  --  26  GLUCOSE 90  --  96  BUN 27*  --  30*  CREATININE 8.70* 9.49* 9.81*  CALCIUM 9.1  --  9.3  PHOS  --   --  7.2*    GFR: Estimated Creatinine Clearance: 12 mL/min (A) (by C-G formula based on SCr of 9.81 mg/dL (H)). Liver Function Tests: Recent Labs  Lab 11/10/21 0802  ALBUMIN 3.0*    No results for input(s): "LIPASE", "AMYLASE" in the last 168 hours. No results for input(s): "AMMONIA" in  the last 168 hours. Coagulation Profile: No results for input(s): "INR", "PROTIME" in the last 168 hours. Cardiac Enzymes: No results for input(s): "CKTOTAL", "CKMB", "CKMBINDEX", "TROPONINI" in the last 168 hours. BNP (last 3 results) No results for input(s): "PROBNP" in the last 8760 hours. HbA1C: No results for input(s): "HGBA1C" in the last 72 hours. CBG: No results for input(s): "GLUCAP" in the last 168 hours. Lipid Profile: Recent Labs    11/11/21 0057  CHOL 136  HDL 66  LDLCALC 49  TRIG 104  CHOLHDL  2.1    Thyroid Function Tests: No results for input(s): "TSH", "T4TOTAL", "FREET4", "T3FREE", "THYROIDAB" in the last 72 hours. Anemia Panel: No results for input(s): "VITAMINB12", "FOLATE", "FERRITIN", "TIBC", "IRON", "RETICCTPCT" in the last 72 hours. Sepsis Labs: No results for input(s): "PROCALCITON", "LATICACIDVEN" in the last 168 hours.  Recent Results (from the past 240 hour(s))  SARS Coronavirus 2 by RT PCR (hospital order, performed in [facility] hospital lab) *cepheid single result test* Anterior Nasal Swab     Status: None   Collection Time: 11/09/21 11:31 PM   Specimen: Anterior Nasal Swab  Result Value Ref Range Status   SARS Coronavirus 2 by RT PCR NEGATIVE NEGATIVE Final    Comment: (NOTE) SARS-CoV-2 target nucleic acids are NOT DETECTED.  The SARS-CoV-2 RNA is generally detectable in upper and lower respiratory specimens during the acute phase of infection. The lowest concentration of SARS-CoV-2 viral copies this assay can detect is 250 copies / mL. A negative result does not preclude SARS-CoV-2 infection and should not be used as the sole basis for treatment or other patient management decisions.  A negative result may occur with improper specimen collection / handling, submission of specimen other than nasopharyngeal swab, presence of viral mutation(s) within the areas targeted by this assay, and inadequate number of viral copies (<250 copies / mL). A negative result must be combined with clinical observations, patient history, and epidemiological information.  Fact Sheet for Patients:   https://www.patel.info/  Fact Sheet for Healthcare Providers: https://hall.com/  This test is not yet approved or  cleared by the [place] FDA and has been authorized for detection and/or diagnosis of SARS-CoV-2 by FDA under an Emergency Use Authorization (EUA).  This EUA will remain in effect (meaning this test can be used) for  the duration of the COVID-19 declaration under Section 564(b)(1) of the Act, 21 U.S.C. section 360bbb-3(b)(1), unless the authorization is terminated or revoked sooner.  Performed at [facility], [place] [address], [place], [place] [address]   MRSA Next Gen by PCR, Nasal     Status: None   Collection Time: 11/10/21  5:06 AM   Specimen: Nasal Mucosa; Nasal Swab  Result Value Ref Range Status   MRSA by PCR Next Gen NOT DETECTED NOT DETECTED Final    Comment: (NOTE) The GeneXpert MRSA Assay (FDA approved for NASAL specimens only), is one component of a comprehensive MRSA colonization surveillance program. It is not intended to diagnose MRSA infection nor to guide or monitor treatment for MRSA infections. Test performance is not FDA approved in patients less than 77 years old. Performed at [facility], [place] [address] [place], [place] [address]          Radiology Studies: US THYROID  Result Date: 11/12/2021 CLINICAL DATA:  61 year old male with a history of incidental thyroid nodule EXAM: THYROID ULTRASOUND TECHNIQUE: Ultrasound examination of the thyroid gland and adjacent soft tissues was performed. COMPARISON:  CT chest 11/10/2021  FINDINGS: Parenchymal Echotexture: Mildly heterogenous Isthmus: 10 mm Right lobe: 5.2 cm x 1.7 cm x 1.8 cm Left lobe: 5.8 cm x 3.2 cm x 2.6 cm _________________________________________________________ Estimated total number of nodules >/= 1 cm: 1 Number of spongiform nodules >/=  2 cm not described below (TR1): 0 Number of mixed cystic and solid nodules >/= 1.5 cm not described below (TR2): 0 _________________________________________________________ Nodule # 1: Location: Left; Mid Maximum size: 4.1 cm; Other 2 dimensions: 3.5 cm x 2.6 cm Composition: solid/almost completely solid (2) Echogenicity: isoechoic (1) Shape: not taller-than-wide (0) Margins: ill-defined (0) Echogenic foci: none (0) ACR TI-RADS total points: 3. ACR TI-RADS risk category:  TR4 (4-6 points). ACR TI-RADS recommendations: Nodule meets criteria for biopsy _________________________________________________________ No adenopathy IMPRESSION: Left mid thyroid nodule (labeled 1, 4.1 cm) meets criteria for biopsy, as designated by the newly established ACR TI-RADS criteria, and referral for biopsy is recommended. Recommendations follow those established by the new ACR TI-RADS criteria (J Am Coll Radiol 7867;67:209-470). Electronically Signed   By: Corrie Mckusick D.O.   On: 11/12/2021 08:29   ECHOCARDIOGRAM COMPLETE  Result Date: 11/12/2021    ECHOCARDIOGRAM REPORT   Patient Name:   Adam Dennis Adam Dennis Date of Exam: 11/11/2021 Medical Rec #:  [id number]       Height:       71.0 in Accession #:    [id number]      Weight:       327.6 lb Date of Birth:  Jun 15, 1960        BSA:          2.601 m Patient Age:    74 years        BP:           131/75 mmHg Patient Gender: M               HR:           89 bpm. Exam Location:  Inpatient Procedure: 2D Echo, Cardiac Doppler, Color Doppler and Intracardiac            Opacification Agent Indications:     R07.9* Chest pain, unspecified  History:         Patient has prior history of Echocardiogram examinations, most                  recent 07/01/2019. CHF, Previous Myocardial Infarction, COPD;                  Risk Factors:Diabetes and Hypertension.  Sonographer:     Bernadene Person RDCS Referring Phys:  [place] Diagnosing Phys: Charolette Forward MD  Sonographer Comments: Technically difficult study due to poor echo windows and patient is morbidly obese. Image acquisition challenging due to patient body habitus. IMPRESSIONS  1. Left ventricular ejection fraction, by estimation, is 50 to 55%. The left ventricle has normal function. The left ventricle has no regional wall motion abnormalities. Left ventricular diastolic parameters are indeterminate.  2. Right ventricular systolic function is normal. The right ventricular size is normal.  3. Left atrial size was  mildly dilated.  4. The mitral valve is normal in structure. Trivial mitral valve regurgitation.  5. The aortic valve is normal in structure. Aortic valve regurgitation is not visualized. Aortic valve sclerosis is present, with no evidence of aortic valve stenosis.  6. The inferior vena cava is normal in size with greater than 50% respiratory variability, suggesting right atrial pressure of 3 mmHg. FINDINGS  Left Ventricle: Left ventricular  ejection fraction, by estimation, is 50 to 55%. The left ventricle has normal function. The left ventricle has no regional wall motion abnormalities. Definity contrast agent was given IV to delineate the left ventricular  endocardial borders. The left ventricular internal cavity size was normal in size. There is no left ventricular hypertrophy. Left ventricular diastolic parameters are indeterminate. Right Ventricle: The right ventricular size is normal. No increase in right ventricular wall thickness. Right ventricular systolic function is normal. Left Atrium: Left atrial size was mildly dilated. Right Atrium: Right atrial size was normal in size. Pericardium: There is no evidence of pericardial effusion. Mitral Valve: The mitral valve is normal in structure. Trivial mitral valve regurgitation. Tricuspid Valve: The tricuspid valve is normal in structure. Tricuspid valve regurgitation is not demonstrated. Aortic Valve: The aortic valve is normal in structure. Aortic valve regurgitation is not visualized. Aortic valve sclerosis is present, with no evidence of aortic valve stenosis. Pulmonic Valve: The pulmonic valve was not well visualized. Pulmonic valve regurgitation is not visualized. Aorta: The aortic root is normal in size and structure. Venous: The inferior vena cava is normal in size with greater than 50% respiratory variability, suggesting right atrial pressure of 3 mmHg. IAS/Shunts: No atrial level shunt detected by color flow Doppler.  LEFT VENTRICLE PLAX 2D LVIDd:          5.40 cm   Diastology LVIDs:         3.80 cm   LV e' medial:    5.78 cm/s LV PW:         1.00 cm   LV E/e' medial:  9.3 LV IVS:        1.00 cm   LV e' lateral:   6.66 cm/s LVOT diam:     2.30 cm   LV E/e' lateral: 8.0 LV SV:         75 LV SV Index:   29 LVOT Area:     4.15 cm  LEFT ATRIUM             Index LA diam:        4.40 cm 1.69 cm/m LA Vol (A2C):   45.7 ml 17.57 ml/m LA Vol (A4C):   39.7 ml 15.27 ml/m LA Biplane Vol: 42.9 ml 16.50 ml/m  AORTIC VALVE LVOT Vmax:   111.00 cm/s LVOT Vmean:  71.300 cm/s LVOT VTI:    0.180 m  AORTA Ao Root diam: 3.70 cm Ao Asc diam:  3.70 cm MITRAL VALVE MV Area (PHT): 4.49 cm     SHUNTS MV Decel Time: 169 msec     Systemic VTI:  0.18 m MV E velocity: 53.60 cm/s   Systemic Diam: 2.30 cm MV A velocity: 105.00 cm/s MV E/A ratio:  0.51 Charolette Forward MD Electronically signed by Charolette Forward MD Signature Date/Time: 11/12/2021/7:04:30 AM    Final         Scheduled Meds:  amLODipine  5 mg Oral Daily   aspirin EC  81 mg Oral Daily   atorvastatin  40 mg Oral Daily   Chlorhexidine Gluconate Cloth  6 each Topical Q0600   heparin  5,000 Units Subcutaneous Q8H   latanoprost  1 drop Both Eyes QHS   sevelamer carbonate  800 mg Oral TID WC   timolol  1 drop Both Eyes Daily   Continuous Infusions:        Aline August, MD [facility] 11/12/2021, 10:05 AM

## 2021-11-12 NOTE — Progress Notes (Signed)
[facility] Progress Note   61 y.o. male with medical history significant for ESRD HD TTS [facility]  with Dr. Posey Pronto, prior MI, essential hypertension, hyperlipidemia, type 2 diabetes, presented with complaints of nonexertional chest pain while sitting on the bus on his way home from work.  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  His chest pain was not similar to prior MI.  His last HD was on 6/15 and pt left at 146.8kg; prior treatment he left at 147 kg but prior week has reached his EDW. HST elevated but peaked at 43. Did not have any CP at time of consultation.   Dialysis orders [facility], 4 hrs 15 min, Optiflux 200NRe, BFR 400, DFR Autoflow 1.5, EDW 145.5 (kg), Dialysate 2.0 K, 2.0 Ca, 1.0 Mg, 100 Dextrose (G2201), Sodium 137 (mEq/L), Bicarb Setting: 35 (mEq/L), UFR Profile: Profile 2, Sodium Model: None, Access: AVFistula Hect 86mg IV qtx, Heparin 10,200 Not on ESA.   Assessment/ Plan:   1) ESRD: volume overload.  Started HD on 07/01/2019 after placement of right IJ TDC but now has been receiving HD through a right arm BCF.   - next HD Tuesday 11/13/21 - challenge 0.2-0.3 kg  2)   HTN: Restart home regimen and should improve with UF after dialysis.  Continue to monitor.     3)  Anemia of CKD: Hemoglobin at goal, no ESA needed.     4) Bone/ mineral: Phosphorus level was in range 09/2021; phos 7.2 now. On Hect 464m on HD. On [place] 1 TIDM + snacks. Has been restarted.   5) h/o CHF: compensated   6) Chest pain: Nuclear stress test tomorrow.  Subjective:   Feels better. No further chest pain. Denies f/c/n/v but did have cramping when he returned to his room from dialysis yesterday.   Objective:   BP (!) 153/95 (BP Location: Left Arm)   Pulse 89   Temp 98 F (36.7 C)   Resp 10   Ht '5\' 11"'$  (1.803 m)   Wt (!) 148.6 kg   SpO2 100%   BMI 45.69 kg/m  No intake or output data  in the 24 hours ending 11/12/21 1145  Weight change:   Physical Exam: General: Well nourished NAD Cardiovascular: RRR  Respiratory: CTA b/l Abdomen: SDNT +BS Muskuloskeletal: No cyanosis or clubbing. Neuro: Strength, sensation intact Skin: No ulcerative lesions noted or rashes Access: rt BCF, aneurysmal and puffy bruit at inflow limb.  Imaging: [place]  Result Date: 11/12/2021 CLINICAL DATA:  [age]ear old male with a history of incidental thyroid nodule EXAM: THYROID ULTRASOUND TECHNIQUE: Ultrasound examination of the thyroid gland and adjacent soft tissues was performed. COMPARISON:  CT chest 11/10/2021 FINDINGS: Parenchymal Echotexture: Mildly heterogenous Isthmus: 10 mm Right lobe: 5.2 cm x 1.7 cm x 1.8 cm Left lobe: 5.8 cm x 3.2 cm x 2.6 cm _________________________________________________________ Estimated total number of nodules >/= 1 cm: 1 Number of spongiform nodules >/=  2 cm not described below (TR1): 0 Number of mixed cystic and solid nodules >/= 1.5 cm not described below (TR2): 0 _________________________________________________________ Nodule # 1: Location: Left; Mid Maximum size: 4.1 cm; Other 2 dimensions: 3.5 cm x 2.6 cm Composition: solid/almost completely solid (2) Echogenicity: isoechoic (1) Shape: not taller-than-wide (0) Margins: ill-defined (0) Echogenic foci: none (0) ACR TI-RADS total points: 3. ACR TI-RADS risk category: TR4 (4-6 points). ACR TI-RADS recommendations: Nodule meets criteria  for biopsy _________________________________________________________ No adenopathy IMPRESSION: Left mid thyroid nodule (labeled 1, 4.1 cm) meets criteria for biopsy, as designated by the newly established ACR TI-RADS criteria, and referral for biopsy is recommended. Recommendations follow those established by the new ACR TI-RADS criteria (J Am Coll Radiol 2774;12:878-676). Electronically Signed   By: Corrie Mckusick D.O.   On: 11/12/2021 08:29   ECHOCARDIOGRAM COMPLETE  Result Date:  11/12/2021    ECHOCARDIOGRAM REPORT   Patient Name:   Adam Dennis Date of Exam: 11/11/2021 Medical Rec #:  [id number]       Height:       71.0 in Accession #:    [id number]      Weight:       327.6 lb Date of Birth:  [birth year]        BSA:          2.601 m Patient Age:    55 years        BP:           131/75 mmHg Patient Gender: M               HR:           89 bpm. Exam Location:  Inpatient Procedure: 2D Echo, Cardiac Doppler, Color Doppler and Intracardiac            Opacification Agent Indications:     R07.9* Chest pain, unspecified  History:         Patient has prior history of Echocardiogram examinations, most                  recent 07/01/2019. CHF, Previous Myocardial Infarction, COPD;                  Risk Factors:Diabetes and Hypertension.  Sonographer:     Bernadene Person RDCS Referring Phys:  [place] Diagnosing Phys: Charolette Forward MD  Sonographer Comments: Technically difficult study due to poor echo windows and patient is morbidly obese. Image acquisition challenging due to patient body habitus. IMPRESSIONS  1. Left ventricular ejection fraction, by estimation, is 50 to 55%. The left ventricle has normal function. The left ventricle has no regional wall motion abnormalities. Left ventricular diastolic parameters are indeterminate.  2. Right ventricular systolic function is normal. The right ventricular size is normal.  3. Left atrial size was mildly dilated.  4. The mitral valve is normal in structure. Trivial mitral valve regurgitation.  5. The aortic valve is normal in structure. Aortic valve regurgitation is not visualized. Aortic valve sclerosis is present, with no evidence of aortic valve stenosis.  6. The inferior vena cava is normal in size with greater than 50% respiratory variability, suggesting right atrial pressure of 3 mmHg. FINDINGS  Left Ventricle: Left ventricular ejection fraction, by estimation, is 50 to 55%. The left ventricle has normal function. The left ventricle has no  regional wall motion abnormalities. Definity contrast agent was given IV to delineate the left ventricular  endocardial borders. The left ventricular internal cavity size was normal in size. There is no left ventricular hypertrophy. Left ventricular diastolic parameters are indeterminate. Right Ventricle: The right ventricular size is normal. No increase in right ventricular wall thickness. Right ventricular systolic function is normal. Left Atrium: Left atrial size was mildly dilated. Right Atrium: Right atrial size was normal in size. Pericardium: There is no evidence of pericardial effusion. Mitral Valve: The mitral valve is normal in structure. Trivial mitral valve regurgitation. Tricuspid Valve: The tricuspid  valve is normal in structure. Tricuspid valve regurgitation is not demonstrated. Aortic Valve: The aortic valve is normal in structure. Aortic valve regurgitation is not visualized. Aortic valve sclerosis is present, with no evidence of aortic valve stenosis. Pulmonic Valve: The pulmonic valve was not well visualized. Pulmonic valve regurgitation is not visualized. Aorta: The aortic root is normal in size and structure. Venous: The inferior vena cava is normal in size with greater than 50% respiratory variability, suggesting right atrial pressure of 3 mmHg. IAS/Shunts: No atrial level shunt detected by color flow Doppler.  LEFT VENTRICLE PLAX 2D LVIDd:         5.40 cm   Diastology LVIDs:         3.80 cm   LV e' medial:    5.78 cm/s LV PW:         1.00 cm   LV E/e' medial:  9.3 LV IVS:        1.00 cm   LV e' lateral:   6.66 cm/s LVOT diam:     2.30 cm   LV E/e' lateral: 8.0 LV SV:         75 LV SV Index:   29 LVOT Area:     4.15 cm  LEFT ATRIUM             Index LA diam:        4.40 cm 1.69 cm/m LA Vol (A2C):   45.7 ml 17.57 ml/m LA Vol (A4C):   39.7 ml 15.27 ml/m LA Biplane Vol: 42.9 ml 16.50 ml/m  AORTIC VALVE LVOT Vmax:   111.00 cm/s LVOT Vmean:  71.300 cm/s LVOT VTI:    0.180 m  AORTA Ao Root diam:  3.70 cm Ao Asc diam:  3.70 cm MITRAL VALVE MV Area (PHT): 4.49 cm     SHUNTS MV Decel Time: 169 msec     Systemic VTI:  0.18 m MV E velocity: 53.60 cm/s   Systemic Diam: 2.30 cm MV A velocity: 105.00 cm/s MV E/A ratio:  0.51 Charolette Forward MD Electronically signed by Charolette Forward MD Signature Date/Time: 11/12/2021/7:04:30 AM    Final     Labs: BMET Recent Labs  Lab 11/09/21 1816 11/10/21 0318 11/10/21 0802  NA 136  --  135  K 4.4  --  4.5  CL 93*  --  93*  CO2 29  --  26  GLUCOSE 90  --  96  BUN 27*  --  30*  CREATININE 8.70* 9.49* 9.81*  CALCIUM 9.1  --  9.3  PHOS  --   --  7.2*   CBC Recent Labs  Lab 11/09/21 1816 11/10/21 0318  WBC 9.4 7.4  HGB 12.2* 11.6*  HCT 39.4 36.8*  MCV 94.7 93.6  PLT 248 246    Medications:     amLODipine  5 mg Oral Daily   aspirin EC  81 mg Oral Daily   atorvastatin  40 mg Oral Daily   Chlorhexidine Gluconate Cloth  6 each Topical Q0600   heparin  5,000 Units Subcutaneous Q8H   latanoprost  1 drop Both Eyes QHS   sevelamer carbonate  800 mg Oral TID WC   timolol  1 drop Both Eyes Daily      Adam Lips MD 11/12/2021, 11:45 AM

## 2021-11-13 DIAGNOSIS — R079 Chest pain, unspecified: Secondary | ICD-10-CM | POA: Diagnosis not present

## 2021-11-13 DIAGNOSIS — N186 End stage renal disease: Secondary | ICD-10-CM | POA: Diagnosis not present

## 2021-11-13 DIAGNOSIS — R0789 Other chest pain: Secondary | ICD-10-CM | POA: Diagnosis not present

## 2021-11-13 DIAGNOSIS — E669 Obesity, unspecified: Secondary | ICD-10-CM | POA: Diagnosis not present

## 2021-11-13 DIAGNOSIS — Z992 Dependence on renal dialysis: Secondary | ICD-10-CM | POA: Diagnosis not present

## 2021-11-13 DIAGNOSIS — I208 Other forms of angina pectoris: Secondary | ICD-10-CM | POA: Diagnosis not present

## 2021-11-13 DIAGNOSIS — R748 Abnormal levels of other serum enzymes: Secondary | ICD-10-CM | POA: Diagnosis not present

## 2021-11-13 DIAGNOSIS — I517 Cardiomegaly: Secondary | ICD-10-CM | POA: Diagnosis not present

## 2021-11-13 DIAGNOSIS — E119 Type 2 diabetes mellitus without complications: Secondary | ICD-10-CM | POA: Diagnosis not present

## 2021-11-13 DIAGNOSIS — I10 Essential (primary) hypertension: Secondary | ICD-10-CM | POA: Diagnosis not present

## 2021-11-13 DIAGNOSIS — D638 Anemia in other chronic diseases classified elsewhere: Secondary | ICD-10-CM | POA: Diagnosis not present

## 2021-11-13 DIAGNOSIS — E785 Hyperlipidemia, unspecified: Secondary | ICD-10-CM | POA: Diagnosis not present

## 2021-11-13 DIAGNOSIS — E1169 Type 2 diabetes mellitus with other specified complication: Secondary | ICD-10-CM | POA: Diagnosis not present

## 2021-11-13 LAB — HEPATITIS B SURFACE ANTIBODY, QUANTITATIVE: Hep B S AB Quant (Post): 150.8 m[IU]/mL (ref >9.9)

## 2021-11-13 MED ORDER — LIDOCAINE-PRILOCAINE 2.5-2.5 % EX CREA: 1.0000 | TOPICAL_CREAM | CUTANEOUS | Status: DC | PRN

## 2021-11-13 MED ORDER — ALTEPLASE 2 MG IJ SOLR: 2.0000 mg | Freq: Once | INTRAMUSCULAR | Status: DC | PRN

## 2021-11-13 MED ORDER — REGADENOSON 0.4 MG/5ML IV SOLN
INTRAVENOUS | Status: AC
  Administered 2021-11-13: 0.4 mg via INTRAVENOUS
  Filled 2021-11-13: qty 5

## 2021-11-13 MED ORDER — HEPARIN SODIUM (PORCINE) 1000 UNIT/ML DIALYSIS: 1000.0000 [IU] | INTRAMUSCULAR | Status: DC | PRN

## 2021-11-13 MED ORDER — ANTICOAGULANT SODIUM CITRATE 4% (200MG/5ML) IV SOLN: 5.0000 mL | Status: DC | PRN

## 2021-11-13 MED ORDER — TECHNETIUM TC 99M TETROFOSMIN IV KIT
30.2000 | PACK | Freq: Once | INTRAVENOUS | Status: AC | PRN
  Administered 2021-11-13: 30.2 via INTRAVENOUS

## 2021-11-13 MED ORDER — LIDOCAINE HCL (PF) 1 % IJ SOLN: 5.0000 mL | INTRAMUSCULAR | Status: DC | PRN

## 2021-11-13 MED ORDER — HEPARIN SODIUM (PORCINE) 1000 UNIT/ML IJ SOLN
INTRAMUSCULAR | Status: AC
  Filled 2021-11-13: qty 10

## 2021-11-13 NOTE — Progress Notes (Signed)
Transported to nuclear med. by wheelchair for the second phase of stress test.

## 2021-11-13 NOTE — Progress Notes (Signed)
Back from hemodialysis ,  complaining of  cramps on left hand and right leg. Weaned  off   oxygen pulse ox- 90. After a while , complained of feeling dizzy bp104/68 hr-100 r-20. MD made aware claimed to d/c in 2-3 hours if he feels ok. Provided with snacks . Continue to monitor.

## 2021-11-13 NOTE — Progress Notes (Signed)
Back from nuclear med. By wheelchair.

## 2021-11-13 NOTE — Progress Notes (Signed)
Pt receives out-pt HD at [facility] on TTS. Pt has a 6:10 chair time. Will assist as needed.   Melven Sartorius Renal Navigator [phone]

## 2021-11-13 NOTE — Progress Notes (Signed)
Subjective:  Patient denies any chest pain or shortness of breath.  Seen in nuclear medicine department tolerated stress portion of Lexiscan Myoview no EKG changes were noted.  Objective:  Vital Signs in the last 24 hours: Temp:  [97.5 F (36.4 C)-98 F (36.7 C)] 97.8 F (36.6 C) (06/20 0745) Pulse Rate:  [80-91] 80 (06/20 0405) Resp:  [10-36] 26 (06/20 0958) BP: (126-164)/(60-101) 144/88 (06/20 0956) SpO2:  [95 %-100 %] 95 % (06/20 0759)  Intake/Output from previous day: 06/19 0701 - 06/20 0700 In: 400 [P.O.:400] Out: -  Intake/Output from this shift: No intake/output data recorded.  Physical Exam: Exam unchanged  Lab Results: Recent Labs    11/12/21 1201  WBC 5.3  HGB 11.9*  PLT 227   Recent Labs    11/12/21 1201  NA 134*  K 5.1  CL 96*  CO2 20*  GLUCOSE 110*  BUN 39*  CREATININE 9.98*   No results for input(s): "TROPONINI" in the last 72 hours.  Invalid input(s): "CK", "MB" Hepatic Function Panel Recent Labs    11/12/21 1201  ALBUMIN 2.9*   Recent Labs    11/11/21 0057  CHOL 136   No results for input(s): "PROTIME" in the last 72 hours.  Imaging: Imaging results have been reviewed and No results found.  Cardiac Studies:  Assessment/Plan:  Acute coronary syndrome Minimally elevated high-sensitivity troponin I secondary to demand ischemia/renal failure doubt significant MI HTN HLD Type 2 DM Morbid obesity Obstructive sleep apnea/obesity hypoventilation syndrome on CPAP ESRD dialysis Plan Continue present management Check final Lexiscan Myoview results if negative for ischemia okay to discharge from cardiac point of view   LOS: 0 days    Charolette Forward 11/13/2021, 10:01 AM

## 2021-11-13 NOTE — Progress Notes (Signed)
Transported to hemodialysis by wheel chair awake and alert.

## 2021-11-13 NOTE — Progress Notes (Signed)
[facility] Progress Note   61 y.o. male with medical history significant for ESRD HD TTS [facility]  with Dr. Posey Pronto, prior MI, essential hypertension, hyperlipidemia, type 2 diabetes, presented with complaints of nonexertional chest pain while sitting on the bus on his way home from work.  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  His chest pain was not similar to prior MI.  His last HD was on 6/15 and pt left at 146.8kg; prior treatment he left at 147 kg but prior week has reached his EDW. HST elevated but peaked at 43. Did not have any CP at time of consultation.   Dialysis orders [facility], 4 hrs 15 min, Optiflux 200NRe, BFR 400, DFR Autoflow 1.5, EDW 145.5 (kg), Dialysate 2.0 K, 2.0 Ca, 1.0 Mg, 100 Dextrose (G2201), Sodium 137 (mEq/L), Bicarb Setting: 35 (mEq/L), UFR Profile: Profile 2, Sodium Model: None, Access: AVFistula Hect 34mg IV qtx, Heparin 10,200 Not on ESA.   Assessment/ Plan:   1) ESRD: volume overload.  Started HD on 07/01/2019 after placement of right IJ TDC but now has been receiving HD through a right arm BCF.   - next HD today Tuesday 11/13/21 - challenge 0.2-0.3 kg  2)   HTN: Restart home regimen and should improve with UF after dialysis.  Continue to monitor.     3)  Anemia of CKD: Hemoglobin at goal, no ESA needed.     4) Bone/ mineral: Phosphorus level was in range 09/2021; phos 7.2 now. On Hect 45m on HD. On Sevelemer 1 TIDM + snacks. Has been restarted.   5) h/o CHF: compensated   6) Chest pain: s/p nuclear stress test, official read pending.  Subjective:    Seen and examined on HD.  BFR 300 mL/ min via AVF, UF goal 3.2L- challenging EDW slightly.  S/p nuclear stress test today, official read pending.     Objective:   BP 132/81   Pulse 84   Temp 97.6 F (36.4 C) (Oral)   Resp 14   Ht '5\' 11"'$  (1.803 m)   Wt (!) 148 kg   SpO2 100%   BMI 45.51 kg/m    Intake/Output Summary (Last 24 hours) at 11/13/2021 1541 Last data filed at 11/12/2021 1800 Gross per 24 hour  Intake 250 ml  Output --  Net 250 ml    Weight change:   Physical Exam: General: Well nourished NAD Cardiovascular: RRR  Respiratory: CTA b/l Abdomen: SDNT +BS Muskuloskeletal: No cyanosis or clubbing. Neuro: Strength, sensation intact Skin: No ulcerative lesions noted or rashes Access: rt BCF, aneurysmal and puffy bruit at inflow limb.  Imaging: No results found.  Labs: BMET Recent Labs  Lab 11/09/21 1816 11/10/21 0318 11/10/21 0802 11/12/21 1201  NA 136  --  135 134*  K 4.4  --  4.5 5.1  CL 93*  --  93* 96*  CO2 29  --  26 20*  GLUCOSE 90  --  96 110*  BUN 27*  --  30* 39*  CREATININE 8.70* 9.49* 9.81* 9.98*  CALCIUM 9.1  --  9.3 8.8*  PHOS  --   --  7.2* 7.7*   CBC Recent Labs  Lab 11/09/21 1816 11/10/21 0318 11/12/21 1201  WBC 9.4 7.4 5.3  HGB 12.2* 11.6* 11.9*  HCT 39.4 36.8* 38.4*  MCV 94.7 93.6 93.7  PLT 248 246 227    Medications:  amLODipine  5 mg Oral Daily   aspirin EC  81 mg Oral Daily   atorvastatin  40 mg Oral Daily   Chlorhexidine Gluconate Cloth  6 each Topical Q0600   heparin  5,000 Units Subcutaneous Q8H   heparin sodium (porcine)       latanoprost  1 drop Both Eyes QHS   sevelamer carbonate  800 mg Oral TID WC   timolol  1 drop Both Eyes Daily      Madelon Lips MD 11/13/2021, 3:41 PM

## 2021-11-14 ENCOUNTER — Telehealth: Payer: Self-pay

## 2021-11-14 ENCOUNTER — Telehealth: Payer: Self-pay | Admitting: Nurse Practitioner

## 2021-11-14 NOTE — Telephone Encounter (Signed)
Transition of care contact from inpatient facility  Date of discharge: 11/13/2021 Date of contact: 11/14/2021 Method: Phone Spoke to: Patient  Patient contacted to discuss transition of care from recent inpatient hospitalization. Patient was admitted to [facility] from 06/16-06/20/2023 with discharge diagnosis of volume overload/Chest pain.   Medication changes were reviewed. Patient reports cramping at end of HD. This may be contributing to him not reaching EDW. Adjusted UFP.   Patient will follow up with his/her outpatient HD unit on: 11/15/2021

## 2021-11-14 NOTE — Telephone Encounter (Signed)
Transition Care Management Follow-up Telephone Call Date of discharge and from where: 11/13/2021, [facility] How have you been since you were released from the hospital? He stated he is feeling pretty good.  Any questions or concerns? No  Items Reviewed: Did the pt receive and understand the discharge instructions provided? Yes  Medications obtained and verified? Yes - he said he has all of his medications as well as a glucometer and did not have any questions about the med regime.  Other? No  Any new allergies since your discharge? No  Dietary orders reviewed? Yes Do you have support at home? Yes   Home Care and Equipment/Supplies: Were home health services ordered? no If so, what is the name of the agency? N/a  Has the agency set up a time to come to the patient's home? not applicable Were any new equipment or medical supplies ordered?  No What is the name of the medical supply agency? N/a Were you able to get the supplies/equipment? not applicable Do you have any questions related to the use of the equipment or supplies? No  Attends HD: T/T/S at [facility]: (I = Independent and D = Dependent) ADLs: independent   Follow up appointments reviewed:  PCP Hospital f/u appt confirmed?  Wanted to be seen in a week. Dr Margarita Rana did not have any openings, patient said that he knows where [facility] is and was scheduled to see Juluis Mire, NP -11/21/2021.    [facility] f/u appt confirmed?  None scheduled at this time.  Are transportation arrangements needed? No  - he uses [facility] If their condition worsens, is the pt aware to call PCP or go to the Emergency Dept.? Yes Was the patient provided with contact information for the PCP's office or ED? Yes - provided him with the phone number for [facility]. Was to pt encouraged to call back with questions or concerns? Yes

## 2021-11-14 NOTE — Progress Notes (Signed)
Late Note Entry:  Contacted [facility] to advise clinic of pt's d/c late yesterday afternoon. Clinic advised pt should resume care tomorrow.   Melven Sartorius Renal Navigator [phone]

## 2021-11-14 NOTE — Telephone Encounter (Signed)
From the discharge call:   He stated he is feeling pretty good. He has all of his medications and did not have any questions /concerns at this time.  Attends HD: T/T/S at [facility].   Wanted to be seen in a week. Dr Margarita Rana did not have any openings, patient said that he knows where [facility] is and was scheduled to see Juluis Mire, NP -11/21/2021.

## 2021-11-15 DIAGNOSIS — Z992 Dependence on renal dialysis: Secondary | ICD-10-CM | POA: Diagnosis not present

## 2021-11-15 DIAGNOSIS — N2581 Secondary hyperparathyroidism of renal origin: Secondary | ICD-10-CM | POA: Diagnosis not present

## 2021-11-15 DIAGNOSIS — R079 Chest pain, unspecified: Secondary | ICD-10-CM | POA: Diagnosis not present

## 2021-11-15 DIAGNOSIS — N186 End stage renal disease: Secondary | ICD-10-CM | POA: Diagnosis not present

## 2021-11-17 DIAGNOSIS — N186 End stage renal disease: Secondary | ICD-10-CM | POA: Diagnosis not present

## 2021-11-17 DIAGNOSIS — N2581 Secondary hyperparathyroidism of renal origin: Secondary | ICD-10-CM | POA: Diagnosis not present

## 2021-11-17 DIAGNOSIS — Z992 Dependence on renal dialysis: Secondary | ICD-10-CM | POA: Diagnosis not present

## 2021-11-19 DIAGNOSIS — I871 Compression of vein: Secondary | ICD-10-CM | POA: Diagnosis not present

## 2021-11-19 DIAGNOSIS — Z992 Dependence on renal dialysis: Secondary | ICD-10-CM | POA: Diagnosis not present

## 2021-11-19 DIAGNOSIS — N186 End stage renal disease: Secondary | ICD-10-CM | POA: Diagnosis not present

## 2021-11-19 DIAGNOSIS — T82858A Stenosis of vascular prosthetic devices, implants and grafts, initial encounter: Secondary | ICD-10-CM | POA: Diagnosis not present

## 2021-11-20 DIAGNOSIS — Z992 Dependence on renal dialysis: Secondary | ICD-10-CM | POA: Diagnosis not present

## 2021-11-20 DIAGNOSIS — N2581 Secondary hyperparathyroidism of renal origin: Secondary | ICD-10-CM | POA: Diagnosis not present

## 2021-11-20 DIAGNOSIS — N186 End stage renal disease: Secondary | ICD-10-CM | POA: Diagnosis not present

## 2021-11-21 ENCOUNTER — Encounter (INDEPENDENT_AMBULATORY_CARE_PROVIDER_SITE_OTHER): Payer: Self-pay | Admitting: Primary Care

## 2021-11-21 ENCOUNTER — Ambulatory Visit (INDEPENDENT_AMBULATORY_CARE_PROVIDER_SITE_OTHER): Payer: Medicare HMO | Admitting: Primary Care

## 2021-11-21 VITALS — BP 128/87 | HR 90 | Temp 97.9°F | Ht 71.0 in | Wt 330.2 lb

## 2021-11-21 DIAGNOSIS — Z09 Encounter for follow-up examination after completed treatment for conditions other than malignant neoplasm: Secondary | ICD-10-CM | POA: Diagnosis not present

## 2021-11-21 NOTE — Progress Notes (Signed)
[facility]   Subjective:  [name] is a 61 y.o. male presents for hospital follow up . Patient presented to the ED with complaints of nonexertional chest pain while sitting on the bus on his way home from work.  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  Admit date to the hospital was 11/09/21, patient was discharged from the hospital on 11/13/21, patient was admitted for: Morbid obesity ([name]), Essential hypertension, benign, Diabetes mellitus type 2 in obese ([name]), OSA (obstructive sleep apnea), Chest pain, ESRD on dialysis ([name]) and Anemia of chronic disease.  Today he denies  headache, No chest pain, No abdominal pain - No Nausea, No new weakness tingling or numbness, No Cough - shortness of breath . He complaining of muscles spasms all over and requesting meloxicam not on medication list and has dialysis tomorrow- advised to ask physician making rounds to prescribed. Has an appointment with PCP scheduled keep appt.   Past Medical History:  Diagnosis Date   Arthritis    "back" (02/06/2017)   CHF (congestive heart failure) ([name])    new onset /Encounter Date 09/12/2006   Chronic combined systolic and diastolic heart failure (HCC)    CKD (chronic kidney disease) stage 4, GFR 15-29 ml/min (HCC)    COPD (chronic obstructive pulmonary disease) (HCC)    End stage renal disease on dialysis [facility])    Tues/ Thur/ Sat [place] [facility] [place]   Glaucoma, right eye    High cholesterol    Hypertension    Hypertrophy of tonsils alone    Myocardial infarction ([place])    "small one; ~ 2008" (02/06/2017)   Obesity, unspecified    On home oxygen therapy    "4L; 24/7" (04/10/2017)   OSA on CPAP    Type II diabetes mellitus (HCC)      No Known Allergies    Current Outpatient Medications on File Prior to Visit  Medication Sig Dispense Refill   ACCU-CHEK SOFTCLIX LANCETS lancets Use as instructed to check blood sugar up to 3 times daily. 100 each 11   amLODipine (NORVASC) 5 MG  tablet TAKE 1 TABLET(5 MG) BY MOUTH DAILY (Patient taking differently: Take 5 mg by mouth daily. TAKE 1 TABLET(5 MG) BY MOUTH DAILY) 90 tablet 1   aspirin EC 81 MG tablet Take 81 mg by mouth daily.     atorvastatin (LIPITOR) 40 MG tablet TAKE 1 TABLET(40 MG) BY MOUTH DAILY (Patient taking differently: Take 40 mg by mouth daily.) 90 tablet 1   Blood Glucose Monitoring Suppl (ACCU-CHEK AVIVA) device Use as instructed to check blood sugar up to 3 times daily. 1 each 0   calcium carbonate (TUMS - DOSED IN MG ELEMENTAL CALCIUM) 500 MG chewable tablet Chew 1 tablet (200 mg of elemental calcium total) by mouth 2 (two) times daily. (Patient taking differently: Chew 500 mg by mouth 2 (two) times daily.)     Doxercalciferol (HECTOROL IV) Doxercalciferol (Hectorol)     Dulaglutide (TRULICITY) 1.5 BS/9.6GE SOPN Inject 1.5 mg into the skin once a week. 2 mL 6   FEROSUL 325 (65 Fe) MG tablet TAKE 1 TABLET(325 MG) BY MOUTH DAILY WITH BREAKFAST (Patient taking differently: Take 325 mg by mouth daily with breakfast.) 100 tablet 3   glucose blood (ACCU-CHEK AVIVA PLUS) test strip Use as instructed to check blood sugar up to 3 times daily. 100 each 11   Lancet Devices (ACCU-CHEK SOFTCLIX) lancets Use as instructed daily. 1 each 5  Multiple Vitamin (MULTIVITAMIN ADULT) TABS Take 1 tablet by mouth daily. (Patient taking differently: Take 1 tablet by mouth at bedtime.) 30 tablet 3   nitroGLYCERIN (NITROSTAT) 0.4 MG SL tablet PLACE 1 TABLET BY MOUTH UNDER TONGUE EVERY 5 MINUTES FOR 3 DOSES AS NEEDED FOR CHEST PAIN (Patient taking differently: Place 0.4 mg under the tongue every 5 (five) minutes as needed for chest pain.) 25 tablet 0   sevelamer carbonate (RENVELA) 800 MG tablet Take by mouth See admin instructions. Take 2 tablets po three times daily with meals and take 1 tablet twice a day as needed with snacks     timolol (TIMOPTIC-XR) 0.5 % ophthalmic gel-forming Place 1 drop into both eyes daily.      TRAVATAN Z 0.004 %  SOLN ophthalmic solution Place 1 drop into both eyes at bedtime.  12   No current facility-administered medications on file prior to visit.     Review of System: Comprehensive ROS Pertinent positive and negative noted in HPI   BP 128/87   Pulse 90   Temp 97.9 F (36.6 C) (Oral)   Ht '5\' 11"'$  (1.803 m)   Wt (!) 330 lb 3.2 oz (149.8 kg)   SpO2 93%   BMI 46.05 kg/m   Filed Weights   11/21/21 1004  Weight: (!) 330 lb 3.2 oz (149.8 kg)    Physical Exam: General Appearance: Well nourished,Body mass index is 46.05 kg/m.  Morbidly obese , in no apparent distress. Eyes: PERRLA, EOMs, conjunctiva no swelling or erythema Sinuses: No Frontal/maxillary tenderness ENT/Mouth: Ext aud canals clear, TMs without erythema, bulging. No erythema, swelling, or exudate on post pharynx.  Tonsils not swollen or erythematous. Hearing normal.  Neck: Supple, thyroid normal.  Respiratory: Respiratory effort normal, BS equal bilaterally without rales, rhonchi, wheezing or stridor.  Cardio: RRR with no MRGs. Brisk peripheral pulses without edema.  Abdomen: Soft, + BS.  Non tender, no guarding, rebound, hernias, masses. Lymphatics: Non tender without lymphadenopathy.  Musculoskeletal: Full ROM, 5/5 strength, normal gait.  Skin: Warm, dry without rashes, lesions, ecchymosis.  Neuro: Cranial nerves intact. Normal muscle tone, no cerebellar symptoms. Sensation intact.  Psych: Awake and oriented X 3, normal affect, Insight and Judgment appropriate.    Assessment:  Adam Dennis was seen today for hospitalization follow-up.  Diagnoses and all orders for this visit:  Hospital discharge follow-up Reviewed hospital records. No orders needed with f/u d/c. Established with Dr. Margarita Rana and appointment is already schedule    This note has been created with Dragon speech recognition software and smart phrase technology. Any transcriptional errors are unintentional.   Kerin Perna, NP 11/25/2021, 8:41 PM

## 2021-11-22 DIAGNOSIS — N186 End stage renal disease: Secondary | ICD-10-CM | POA: Diagnosis not present

## 2021-11-22 DIAGNOSIS — Z992 Dependence on renal dialysis: Secondary | ICD-10-CM | POA: Diagnosis not present

## 2021-11-22 DIAGNOSIS — N2581 Secondary hyperparathyroidism of renal origin: Secondary | ICD-10-CM | POA: Diagnosis not present

## 2021-11-23 DIAGNOSIS — E1122 Type 2 diabetes mellitus with diabetic chronic kidney disease: Secondary | ICD-10-CM | POA: Diagnosis not present

## 2021-11-23 DIAGNOSIS — Z992 Dependence on renal dialysis: Secondary | ICD-10-CM | POA: Diagnosis not present

## 2021-11-23 DIAGNOSIS — N186 End stage renal disease: Secondary | ICD-10-CM | POA: Diagnosis not present

## 2021-11-24 ENCOUNTER — Other Ambulatory Visit: Payer: Self-pay | Admitting: Family Medicine

## 2021-11-24 DIAGNOSIS — N2581 Secondary hyperparathyroidism of renal origin: Secondary | ICD-10-CM | POA: Diagnosis not present

## 2021-11-24 DIAGNOSIS — R252 Cramp and spasm: Secondary | ICD-10-CM

## 2021-11-24 DIAGNOSIS — Z992 Dependence on renal dialysis: Secondary | ICD-10-CM | POA: Diagnosis not present

## 2021-11-24 DIAGNOSIS — N186 End stage renal disease: Secondary | ICD-10-CM | POA: Diagnosis not present

## 2021-11-26 NOTE — Telephone Encounter (Signed)
Requested medications are due for refill today.  no  Requested medications are on the active medications list.  no  Last refill. 01/08/2021 #60 1 refill  Future visit scheduled.   yes  Notes to clinic.  Medication was discontinued 10/19/2021, refill not delegated.    Requested Prescriptions  Pending Prescriptions Disp Refills   methocarbamol (ROBAXIN) 500 MG tablet [Pharmacy Med Name: METHOCARBAMOL '500MG'$  TABLETS] 60 tablet 1    Sig: TAKE 1 TABLET(500 MG) BY MOUTH TWICE DAILY AS NEEDED FOR MUSCLE SPASMS     Not Delegated - Analgesics:  Muscle Relaxants Failed - 11/24/2021 11:25 AM      Failed - This refill cannot be delegated      Passed - Valid encounter within last 6 months    Recent Outpatient Visits           5 days ago Hospital discharge follow-up   [place] Kerin Perna, NP   3 months ago Type 2 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, without long-term current use of insulin ([place])   [place], Charlane Ferretti, MD   9 months ago Encounter for [employer] annual wellness exam   [place], Charlane Ferretti, MD   10 months ago Muscle cramp   [place], Charlane Ferretti, MD   1 year ago Diabetes mellitus type 2 in obese [facility])   [place], Enobong, MD       Future Appointments             In 3 months Charlott Rakes, MD [place]

## 2021-11-27 DIAGNOSIS — Z992 Dependence on renal dialysis: Secondary | ICD-10-CM | POA: Diagnosis not present

## 2021-11-27 DIAGNOSIS — N186 End stage renal disease: Secondary | ICD-10-CM | POA: Diagnosis not present

## 2021-11-27 DIAGNOSIS — N2581 Secondary hyperparathyroidism of renal origin: Secondary | ICD-10-CM | POA: Diagnosis not present

## 2021-11-28 ENCOUNTER — Telehealth: Payer: Self-pay | Admitting: Family Medicine

## 2021-11-28 MED ORDER — METHOCARBAMOL 500 MG PO TABS: 500.0000 mg | ORAL_TABLET | Freq: Three times a day (TID) | ORAL | 1 refills | Status: DC | PRN

## 2021-11-28 NOTE — Telephone Encounter (Signed)
Refill sent to Pharmacy. ?

## 2021-11-28 NOTE — Telephone Encounter (Signed)
Pt saw provider [facility] on 11/21/2021 for HFU, no muscle relaxer prescribed.

## 2021-11-28 NOTE — Telephone Encounter (Signed)
Medication Refill - Medication: methocarbamcil 500 mg  Has the patient contacted their pharmacy? Yes.    (Agent: If yes, when and what did the pharmacy advise?) contact provide  Preferred Pharmacy (with phone number or street name):  [facility] [phone] - [place], [address] AT [address] Phone:  [phone]  Fax:  [phone]     Has the patient been seen for an appointment in the last year OR does the patient have an upcoming appointment? Yes.    I was unable to locate medication on med list

## 2021-11-29 DIAGNOSIS — N186 End stage renal disease: Secondary | ICD-10-CM | POA: Diagnosis not present

## 2021-11-29 DIAGNOSIS — Z992 Dependence on renal dialysis: Secondary | ICD-10-CM | POA: Diagnosis not present

## 2021-11-29 DIAGNOSIS — N2581 Secondary hyperparathyroidism of renal origin: Secondary | ICD-10-CM | POA: Diagnosis not present

## 2021-11-29 NOTE — Telephone Encounter (Signed)
Pt called and informed of refills being sent.

## 2021-12-01 DIAGNOSIS — N186 End stage renal disease: Secondary | ICD-10-CM | POA: Diagnosis not present

## 2021-12-01 DIAGNOSIS — Z992 Dependence on renal dialysis: Secondary | ICD-10-CM | POA: Diagnosis not present

## 2021-12-01 DIAGNOSIS — N2581 Secondary hyperparathyroidism of renal origin: Secondary | ICD-10-CM | POA: Diagnosis not present

## 2021-12-04 DIAGNOSIS — N2581 Secondary hyperparathyroidism of renal origin: Secondary | ICD-10-CM | POA: Diagnosis not present

## 2021-12-04 DIAGNOSIS — Z992 Dependence on renal dialysis: Secondary | ICD-10-CM | POA: Diagnosis not present

## 2021-12-04 DIAGNOSIS — N186 End stage renal disease: Secondary | ICD-10-CM | POA: Diagnosis not present

## 2021-12-06 DIAGNOSIS — Z992 Dependence on renal dialysis: Secondary | ICD-10-CM | POA: Diagnosis not present

## 2021-12-06 DIAGNOSIS — N2581 Secondary hyperparathyroidism of renal origin: Secondary | ICD-10-CM | POA: Diagnosis not present

## 2021-12-06 DIAGNOSIS — N186 End stage renal disease: Secondary | ICD-10-CM | POA: Diagnosis not present

## 2021-12-08 ENCOUNTER — Other Ambulatory Visit: Payer: Self-pay | Admitting: Family Medicine

## 2021-12-08 DIAGNOSIS — N186 End stage renal disease: Secondary | ICD-10-CM | POA: Diagnosis not present

## 2021-12-08 DIAGNOSIS — I10 Essential (primary) hypertension: Secondary | ICD-10-CM

## 2021-12-08 DIAGNOSIS — Z992 Dependence on renal dialysis: Secondary | ICD-10-CM | POA: Diagnosis not present

## 2021-12-08 DIAGNOSIS — E1169 Type 2 diabetes mellitus with other specified complication: Secondary | ICD-10-CM

## 2021-12-08 DIAGNOSIS — N2581 Secondary hyperparathyroidism of renal origin: Secondary | ICD-10-CM | POA: Diagnosis not present

## 2021-12-10 NOTE — Telephone Encounter (Signed)
Requested Prescriptions  Pending Prescriptions Disp Refills  . atorvastatin (LIPITOR) 40 MG tablet [Pharmacy Med Name: ATORVASTATIN '40MG'$  TABLETS] 90 tablet 1    Sig: TAKE 1 TABLET(40 MG) BY MOUTH DAILY     Cardiovascular:  Antilipid - Statins Failed - 12/08/2021  3:12 AM      Failed - Lipid Panel in normal range within the last 12 months    Cholesterol, Total  Date Value Ref Range Status  08/20/2021 146 100 - 199 mg/dL Final   Cholesterol  Date Value Ref Range Status  11/11/2021 136 0 - 200 mg/dL Final   LDL Chol Calc (NIH)  Date Value Ref Range Status  08/20/2021 72 0 - 99 mg/dL Final   LDL Cholesterol  Date Value Ref Range Status  11/11/2021 49 0 - 99 mg/dL Final    Comment:           Total Cholesterol/HDL:CHD Risk Coronary Heart Disease Risk Table                     Men   Women  1/2 Average Risk   3.4   3.3  Average Risk       5.0   4.4  2 X Average Risk   9.6   7.1  3 X Average Risk  23.4   11.0        Use the calculated Patient Ratio above and the CHD Risk Table to determine the patient's CHD Risk.        ATP III CLASSIFICATION (LDL):  <100     mg/dL   Optimal  100-129  mg/dL   Near or Above                    Optimal  130-159  mg/dL   Borderline  160-189  mg/dL   High  >190     mg/dL   Very High Performed at [facility] [address], [place], [place] [address]    HDL  Date Value Ref Range Status  11/11/2021 66 >40 mg/dL Final  08/20/2021 54 >39 mg/dL Final   Triglycerides  Date Value Ref Range Status  11/11/2021 104 <150 mg/dL Final         Passed - Patient is not pregnant      Passed - Valid encounter within last 12 months    Recent Outpatient Visits          2 weeks ago Hospital discharge follow-up   [place] Kerin Perna, NP   3 months ago Type 2 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, without long-term current use of insulin ([place])   [place], Enobong, MD   9 months ago Encounter for [employer] annual wellness exam   [place], Charlane Ferretti, MD   11 months ago Muscle cramp   [place], Charlane Ferretti, MD   1 year ago Diabetes mellitus type 2 in obese [facility])   [place], Enobong, MD      Future Appointments            In 2 months Charlott Rakes, MD [place]           . amLODipine (NORVASC) 5 MG tablet [Pharmacy Med Name: AMLODIPINE BESYLATE '5MG'$  TABLETS] 90 tablet 1    Sig: TAKE 1 TABLET(5 MG)  BY MOUTH DAILY     Cardiovascular: Calcium Channel Blockers 2 Passed - 12/08/2021  3:12 AM      Passed - Last BP in normal range    BP Readings from Last 1 Encounters:  11/21/21 128/87         Passed - Last Heart Rate in normal range    Pulse Readings from Last 1 Encounters:  11/21/21 90         Passed - Valid encounter within last 6 months    Recent Outpatient Visits          2 weeks ago Hospital discharge follow-up   [place] Kerin Perna, NP   3 months ago Type 2 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, without long-term current use of insulin ([place])   [place], Charlane Ferretti, MD   9 months ago Encounter for [employer] annual wellness exam   [place], Charlane Ferretti, MD   11 months ago Muscle cramp   [place], Charlane Ferretti, MD   1 year ago Diabetes mellitus type 2 in obese [facility])   [place], Enobong, MD      Future Appointments            In 2 months Charlott Rakes, MD [place]

## 2021-12-11 DIAGNOSIS — N186 End stage renal disease: Secondary | ICD-10-CM | POA: Diagnosis not present

## 2021-12-11 DIAGNOSIS — N2581 Secondary hyperparathyroidism of renal origin: Secondary | ICD-10-CM | POA: Diagnosis not present

## 2021-12-11 DIAGNOSIS — Z992 Dependence on renal dialysis: Secondary | ICD-10-CM | POA: Diagnosis not present

## 2021-12-12 DIAGNOSIS — I5030 Unspecified diastolic (congestive) heart failure: Secondary | ICD-10-CM | POA: Diagnosis not present

## 2021-12-12 DIAGNOSIS — E785 Hyperlipidemia, unspecified: Secondary | ICD-10-CM | POA: Diagnosis not present

## 2021-12-12 DIAGNOSIS — H40021 Open angle with borderline findings, high risk, right eye: Secondary | ICD-10-CM | POA: Diagnosis not present

## 2021-12-12 DIAGNOSIS — H2513 Age-related nuclear cataract, bilateral: Secondary | ICD-10-CM | POA: Diagnosis not present

## 2021-12-12 DIAGNOSIS — H401123 Primary open-angle glaucoma, left eye, severe stage: Secondary | ICD-10-CM | POA: Diagnosis not present

## 2021-12-12 DIAGNOSIS — E119 Type 2 diabetes mellitus without complications: Secondary | ICD-10-CM | POA: Diagnosis not present

## 2021-12-12 DIAGNOSIS — H5213 Myopia, bilateral: Secondary | ICD-10-CM | POA: Diagnosis not present

## 2021-12-12 DIAGNOSIS — H10413 Chronic giant papillary conjunctivitis, bilateral: Secondary | ICD-10-CM | POA: Diagnosis not present

## 2021-12-12 DIAGNOSIS — Z0100 Encounter for examination of eyes and vision without abnormal findings: Secondary | ICD-10-CM | POA: Diagnosis not present

## 2021-12-12 DIAGNOSIS — N186 End stage renal disease: Secondary | ICD-10-CM | POA: Diagnosis not present

## 2021-12-12 DIAGNOSIS — I10 Essential (primary) hypertension: Secondary | ICD-10-CM | POA: Diagnosis not present

## 2021-12-12 LAB — HM DIABETES EYE EXAM

## 2021-12-13 DIAGNOSIS — N2581 Secondary hyperparathyroidism of renal origin: Secondary | ICD-10-CM | POA: Diagnosis not present

## 2021-12-13 DIAGNOSIS — N186 End stage renal disease: Secondary | ICD-10-CM | POA: Diagnosis not present

## 2021-12-13 DIAGNOSIS — Z992 Dependence on renal dialysis: Secondary | ICD-10-CM | POA: Diagnosis not present

## 2021-12-15 DIAGNOSIS — N2581 Secondary hyperparathyroidism of renal origin: Secondary | ICD-10-CM | POA: Diagnosis not present

## 2021-12-15 DIAGNOSIS — Z992 Dependence on renal dialysis: Secondary | ICD-10-CM | POA: Diagnosis not present

## 2021-12-15 DIAGNOSIS — N186 End stage renal disease: Secondary | ICD-10-CM | POA: Diagnosis not present

## 2021-12-18 DIAGNOSIS — N186 End stage renal disease: Secondary | ICD-10-CM | POA: Diagnosis not present

## 2021-12-18 DIAGNOSIS — N2581 Secondary hyperparathyroidism of renal origin: Secondary | ICD-10-CM | POA: Diagnosis not present

## 2021-12-18 DIAGNOSIS — Z992 Dependence on renal dialysis: Secondary | ICD-10-CM | POA: Diagnosis not present

## 2021-12-20 DIAGNOSIS — Z992 Dependence on renal dialysis: Secondary | ICD-10-CM | POA: Diagnosis not present

## 2021-12-20 DIAGNOSIS — N186 End stage renal disease: Secondary | ICD-10-CM | POA: Diagnosis not present

## 2021-12-20 DIAGNOSIS — N2581 Secondary hyperparathyroidism of renal origin: Secondary | ICD-10-CM | POA: Diagnosis not present

## 2021-12-22 DIAGNOSIS — Z992 Dependence on renal dialysis: Secondary | ICD-10-CM | POA: Diagnosis not present

## 2021-12-22 DIAGNOSIS — N2581 Secondary hyperparathyroidism of renal origin: Secondary | ICD-10-CM | POA: Diagnosis not present

## 2021-12-22 DIAGNOSIS — N186 End stage renal disease: Secondary | ICD-10-CM | POA: Diagnosis not present

## 2021-12-24 DIAGNOSIS — N186 End stage renal disease: Secondary | ICD-10-CM | POA: Diagnosis not present

## 2021-12-24 DIAGNOSIS — Z992 Dependence on renal dialysis: Secondary | ICD-10-CM | POA: Diagnosis not present

## 2021-12-24 DIAGNOSIS — E1122 Type 2 diabetes mellitus with diabetic chronic kidney disease: Secondary | ICD-10-CM | POA: Diagnosis not present

## 2021-12-25 DIAGNOSIS — N186 End stage renal disease: Secondary | ICD-10-CM | POA: Diagnosis not present

## 2021-12-25 DIAGNOSIS — Z992 Dependence on renal dialysis: Secondary | ICD-10-CM | POA: Diagnosis not present

## 2021-12-25 DIAGNOSIS — N2581 Secondary hyperparathyroidism of renal origin: Secondary | ICD-10-CM | POA: Diagnosis not present

## 2021-12-27 DIAGNOSIS — Z992 Dependence on renal dialysis: Secondary | ICD-10-CM | POA: Diagnosis not present

## 2021-12-27 DIAGNOSIS — N2581 Secondary hyperparathyroidism of renal origin: Secondary | ICD-10-CM | POA: Diagnosis not present

## 2021-12-27 DIAGNOSIS — N186 End stage renal disease: Secondary | ICD-10-CM | POA: Diagnosis not present

## 2021-12-29 DIAGNOSIS — Z992 Dependence on renal dialysis: Secondary | ICD-10-CM | POA: Diagnosis not present

## 2021-12-29 DIAGNOSIS — N2581 Secondary hyperparathyroidism of renal origin: Secondary | ICD-10-CM | POA: Diagnosis not present

## 2021-12-29 DIAGNOSIS — N186 End stage renal disease: Secondary | ICD-10-CM | POA: Diagnosis not present

## 2022-01-01 DIAGNOSIS — N2581 Secondary hyperparathyroidism of renal origin: Secondary | ICD-10-CM | POA: Diagnosis not present

## 2022-01-01 DIAGNOSIS — N186 End stage renal disease: Secondary | ICD-10-CM | POA: Diagnosis not present

## 2022-01-01 DIAGNOSIS — Z992 Dependence on renal dialysis: Secondary | ICD-10-CM | POA: Diagnosis not present

## 2022-01-03 ENCOUNTER — Telehealth: Payer: Self-pay | Admitting: Gastroenterology

## 2022-01-03 DIAGNOSIS — N186 End stage renal disease: Secondary | ICD-10-CM | POA: Diagnosis not present

## 2022-01-03 DIAGNOSIS — Z992 Dependence on renal dialysis: Secondary | ICD-10-CM | POA: Diagnosis not present

## 2022-01-03 DIAGNOSIS — N2581 Secondary hyperparathyroidism of renal origin: Secondary | ICD-10-CM | POA: Diagnosis not present

## 2022-01-03 NOTE — Telephone Encounter (Signed)
Patient called requesting for the prep medication to be sent to [facility] on file.

## 2022-01-04 ENCOUNTER — Telehealth: Payer: Self-pay

## 2022-01-04 MED ORDER — NA SULFATE-K SULFATE-MG SULF 17.5-3.13-1.6 GM/177ML PO SOLN: 1.0000 | ORAL | 0 refills | Status: DC

## 2022-01-04 NOTE — Telephone Encounter (Signed)
The pt appt with Dr Ardis Hughs has been changed to 01/21/22 at 830 am at [facility] with Dr Fuller Plan    Left message on machine to call back

## 2022-01-04 NOTE — Telephone Encounter (Signed)
Rx for Suprep sent to pharmacy as requested.

## 2022-01-05 DIAGNOSIS — N186 End stage renal disease: Secondary | ICD-10-CM | POA: Diagnosis not present

## 2022-01-05 DIAGNOSIS — N2581 Secondary hyperparathyroidism of renal origin: Secondary | ICD-10-CM | POA: Diagnosis not present

## 2022-01-05 DIAGNOSIS — Z992 Dependence on renal dialysis: Secondary | ICD-10-CM | POA: Diagnosis not present

## 2022-01-07 NOTE — Telephone Encounter (Signed)
Left message on machine to call back  

## 2022-01-08 DIAGNOSIS — N186 End stage renal disease: Secondary | ICD-10-CM | POA: Diagnosis not present

## 2022-01-08 DIAGNOSIS — Z992 Dependence on renal dialysis: Secondary | ICD-10-CM | POA: Diagnosis not present

## 2022-01-08 DIAGNOSIS — N2581 Secondary hyperparathyroidism of renal origin: Secondary | ICD-10-CM | POA: Diagnosis not present

## 2022-01-08 NOTE — Telephone Encounter (Signed)
Left message on machine to call back  

## 2022-01-08 NOTE — Telephone Encounter (Signed)
Patient called back is aware of appt change.

## 2022-01-08 NOTE — Telephone Encounter (Signed)
Noted pt will call if he has any questions or concerns

## 2022-01-10 DIAGNOSIS — N186 End stage renal disease: Secondary | ICD-10-CM | POA: Diagnosis not present

## 2022-01-10 DIAGNOSIS — Z992 Dependence on renal dialysis: Secondary | ICD-10-CM | POA: Diagnosis not present

## 2022-01-10 DIAGNOSIS — N2581 Secondary hyperparathyroidism of renal origin: Secondary | ICD-10-CM | POA: Diagnosis not present

## 2022-01-12 DIAGNOSIS — N2581 Secondary hyperparathyroidism of renal origin: Secondary | ICD-10-CM | POA: Diagnosis not present

## 2022-01-12 DIAGNOSIS — Z992 Dependence on renal dialysis: Secondary | ICD-10-CM | POA: Diagnosis not present

## 2022-01-12 DIAGNOSIS — N186 End stage renal disease: Secondary | ICD-10-CM | POA: Diagnosis not present

## 2022-01-14 ENCOUNTER — Encounter (HOSPITAL_COMMUNITY): Payer: Self-pay | Admitting: Gastroenterology

## 2022-01-14 ENCOUNTER — Other Ambulatory Visit: Payer: Self-pay

## 2022-01-14 MED ORDER — POLYETHYLENE GLYCOL 3350 17 GM/SCOOP PO POWD: 1.0000 | Freq: Every day | ORAL | 3 refills | Status: DC

## 2022-01-14 NOTE — Telephone Encounter (Signed)
Patient called states the Suprep is not covered by insurance requesting a cheaper alternative please call him once done so he knows.

## 2022-01-14 NOTE — Telephone Encounter (Signed)
I spoke with the pt and he has been advised that I will send miralax to his pharmacy and he will pick up mew instructions at the front desk on the 2nd floor.

## 2022-01-14 NOTE — Progress Notes (Signed)
Attempted to obtain medical history via telephone, unable to reach at this time. HIPAA compliant voicemail message left requesting return call to pre surgical testing department. 

## 2022-01-15 DIAGNOSIS — N2581 Secondary hyperparathyroidism of renal origin: Secondary | ICD-10-CM | POA: Diagnosis not present

## 2022-01-15 DIAGNOSIS — Z992 Dependence on renal dialysis: Secondary | ICD-10-CM | POA: Diagnosis not present

## 2022-01-15 DIAGNOSIS — N186 End stage renal disease: Secondary | ICD-10-CM | POA: Diagnosis not present

## 2022-01-16 DIAGNOSIS — H40021 Open angle with borderline findings, high risk, right eye: Secondary | ICD-10-CM | POA: Diagnosis not present

## 2022-01-16 DIAGNOSIS — H401123 Primary open-angle glaucoma, left eye, severe stage: Secondary | ICD-10-CM | POA: Diagnosis not present

## 2022-01-17 DIAGNOSIS — N186 End stage renal disease: Secondary | ICD-10-CM | POA: Diagnosis not present

## 2022-01-17 DIAGNOSIS — N2581 Secondary hyperparathyroidism of renal origin: Secondary | ICD-10-CM | POA: Diagnosis not present

## 2022-01-17 DIAGNOSIS — Z992 Dependence on renal dialysis: Secondary | ICD-10-CM | POA: Diagnosis not present

## 2022-01-19 DIAGNOSIS — Z992 Dependence on renal dialysis: Secondary | ICD-10-CM | POA: Diagnosis not present

## 2022-01-19 DIAGNOSIS — N2581 Secondary hyperparathyroidism of renal origin: Secondary | ICD-10-CM | POA: Diagnosis not present

## 2022-01-19 DIAGNOSIS — N186 End stage renal disease: Secondary | ICD-10-CM | POA: Diagnosis not present

## 2022-01-21 ENCOUNTER — Emergency Department (HOSPITAL_COMMUNITY): Payer: Medicare HMO

## 2022-01-21 ENCOUNTER — Inpatient Hospital Stay (HOSPITAL_COMMUNITY)
Admission: EM | Admit: 2022-01-21 | Discharge: 2022-01-26 | DRG: 190 | Disposition: A | Discharge status: Home / Self Care | Payer: Medicare HMO | Source: Ambulatory Visit | Attending: Internal Medicine | Admitting: Internal Medicine

## 2022-01-21 ENCOUNTER — Encounter (HOSPITAL_COMMUNITY)
Admission: RE | Disposition: A | Discharge status: Home / Self Care | Payer: Self-pay | Source: Home / Self Care | Attending: Gastroenterology

## 2022-01-21 ENCOUNTER — Ambulatory Visit (HOSPITAL_COMMUNITY): Payer: Medicare HMO | Admitting: Anesthesiology

## 2022-01-21 ENCOUNTER — Encounter (HOSPITAL_COMMUNITY): Payer: Self-pay | Admitting: Pharmacy Technician

## 2022-01-21 ENCOUNTER — Other Ambulatory Visit: Payer: Self-pay

## 2022-01-21 ENCOUNTER — Ambulatory Visit (HOSPITAL_COMMUNITY)
Admission: RE | Admit: 2022-01-21 | Discharge: 2022-01-21 | Disposition: A | Discharge status: Home / Self Care | Payer: Medicare HMO | Source: Home / Self Care | Attending: Gastroenterology | Admitting: Gastroenterology

## 2022-01-21 ENCOUNTER — Encounter (HOSPITAL_COMMUNITY): Payer: Self-pay | Admitting: Gastroenterology

## 2022-01-21 DIAGNOSIS — K5900 Constipation, unspecified: Secondary | ICD-10-CM | POA: Insufficient documentation

## 2022-01-21 DIAGNOSIS — E1169 Type 2 diabetes mellitus with other specified complication: Secondary | ICD-10-CM | POA: Diagnosis not present

## 2022-01-21 DIAGNOSIS — J9621 Acute and chronic respiratory failure with hypoxia: Secondary | ICD-10-CM | POA: Diagnosis present

## 2022-01-21 DIAGNOSIS — Z992 Dependence on renal dialysis: Secondary | ICD-10-CM | POA: Diagnosis not present

## 2022-01-21 DIAGNOSIS — E871 Hypo-osmolality and hyponatremia: Secondary | ICD-10-CM | POA: Diagnosis present

## 2022-01-21 DIAGNOSIS — E7800 Pure hypercholesterolemia, unspecified: Secondary | ICD-10-CM | POA: Diagnosis present

## 2022-01-21 DIAGNOSIS — Z8249 Family history of ischemic heart disease and other diseases of the circulatory system: Secondary | ICD-10-CM

## 2022-01-21 DIAGNOSIS — N2581 Secondary hyperparathyroidism of renal origin: Secondary | ICD-10-CM | POA: Diagnosis present

## 2022-01-21 DIAGNOSIS — J449 Chronic obstructive pulmonary disease, unspecified: Secondary | ICD-10-CM | POA: Insufficient documentation

## 2022-01-21 DIAGNOSIS — H409 Unspecified glaucoma: Secondary | ICD-10-CM | POA: Diagnosis present

## 2022-01-21 DIAGNOSIS — I132 Hypertensive heart and chronic kidney disease with heart failure and with stage 5 chronic kidney disease, or end stage renal disease: Secondary | ICD-10-CM | POA: Diagnosis present

## 2022-01-21 DIAGNOSIS — Z79899 Other long term (current) drug therapy: Secondary | ICD-10-CM | POA: Diagnosis not present

## 2022-01-21 DIAGNOSIS — E1165 Type 2 diabetes mellitus with hyperglycemia: Secondary | ICD-10-CM | POA: Diagnosis present

## 2022-01-21 DIAGNOSIS — Z7982 Long term (current) use of aspirin: Secondary | ICD-10-CM

## 2022-01-21 DIAGNOSIS — N186 End stage renal disease: Secondary | ICD-10-CM | POA: Diagnosis present

## 2022-01-21 DIAGNOSIS — J9601 Acute respiratory failure with hypoxia: Secondary | ICD-10-CM | POA: Diagnosis present

## 2022-01-21 DIAGNOSIS — I120 Hypertensive chronic kidney disease with stage 5 chronic kidney disease or end stage renal disease: Secondary | ICD-10-CM | POA: Insufficient documentation

## 2022-01-21 DIAGNOSIS — Z5309 Procedure and treatment not carried out because of other contraindication: Secondary | ICD-10-CM | POA: Insufficient documentation

## 2022-01-21 DIAGNOSIS — G4733 Obstructive sleep apnea (adult) (pediatric): Secondary | ICD-10-CM | POA: Diagnosis present

## 2022-01-21 DIAGNOSIS — I252 Old myocardial infarction: Secondary | ICD-10-CM | POA: Diagnosis not present

## 2022-01-21 DIAGNOSIS — Z833 Family history of diabetes mellitus: Secondary | ICD-10-CM | POA: Diagnosis not present

## 2022-01-21 DIAGNOSIS — J9622 Acute and chronic respiratory failure with hypercapnia: Secondary | ICD-10-CM | POA: Diagnosis present

## 2022-01-21 DIAGNOSIS — E1122 Type 2 diabetes mellitus with diabetic chronic kidney disease: Secondary | ICD-10-CM | POA: Diagnosis present

## 2022-01-21 DIAGNOSIS — J9811 Atelectasis: Secondary | ICD-10-CM | POA: Diagnosis present

## 2022-01-21 DIAGNOSIS — J441 Chronic obstructive pulmonary disease with (acute) exacerbation: Principal | ICD-10-CM | POA: Diagnosis present

## 2022-01-21 DIAGNOSIS — Z20822 Contact with and (suspected) exposure to covid-19: Secondary | ICD-10-CM | POA: Diagnosis present

## 2022-01-21 DIAGNOSIS — I5042 Chronic combined systolic (congestive) and diastolic (congestive) heart failure: Secondary | ICD-10-CM | POA: Diagnosis present

## 2022-01-21 DIAGNOSIS — E785 Hyperlipidemia, unspecified: Secondary | ICD-10-CM | POA: Diagnosis present

## 2022-01-21 DIAGNOSIS — E669 Obesity, unspecified: Secondary | ICD-10-CM | POA: Diagnosis not present

## 2022-01-21 DIAGNOSIS — G4730 Sleep apnea, unspecified: Secondary | ICD-10-CM | POA: Insufficient documentation

## 2022-01-21 DIAGNOSIS — Z7984 Long term (current) use of oral hypoglycemic drugs: Secondary | ICD-10-CM | POA: Insufficient documentation

## 2022-01-21 DIAGNOSIS — I509 Heart failure, unspecified: Secondary | ICD-10-CM | POA: Diagnosis not present

## 2022-01-21 DIAGNOSIS — R0902 Hypoxemia: Secondary | ICD-10-CM | POA: Diagnosis present

## 2022-01-21 DIAGNOSIS — K625 Hemorrhage of anus and rectum: Secondary | ICD-10-CM | POA: Insufficient documentation

## 2022-01-21 DIAGNOSIS — N250 Renal osteodystrophy: Secondary | ICD-10-CM | POA: Diagnosis not present

## 2022-01-21 DIAGNOSIS — E662 Morbid (severe) obesity with alveolar hypoventilation: Secondary | ICD-10-CM | POA: Diagnosis present

## 2022-01-21 DIAGNOSIS — M898X9 Other specified disorders of bone, unspecified site: Secondary | ICD-10-CM | POA: Diagnosis present

## 2022-01-21 DIAGNOSIS — I2510 Atherosclerotic heart disease of native coronary artery without angina pectoris: Secondary | ICD-10-CM | POA: Diagnosis present

## 2022-01-21 DIAGNOSIS — I10 Essential (primary) hypertension: Secondary | ICD-10-CM | POA: Diagnosis not present

## 2022-01-21 DIAGNOSIS — Z6841 Body Mass Index (BMI) 40.0 and over, adult: Secondary | ICD-10-CM

## 2022-01-21 DIAGNOSIS — E66813 Obesity, class 3: Secondary | ICD-10-CM | POA: Diagnosis present

## 2022-01-21 DIAGNOSIS — Z9981 Dependence on supplemental oxygen: Secondary | ICD-10-CM

## 2022-01-21 DIAGNOSIS — D631 Anemia in chronic kidney disease: Secondary | ICD-10-CM | POA: Diagnosis present

## 2022-01-21 DIAGNOSIS — Z85038 Personal history of other malignant neoplasm of large intestine: Secondary | ICD-10-CM | POA: Insufficient documentation

## 2022-01-21 DIAGNOSIS — H4010X0 Unspecified open-angle glaucoma, stage unspecified: Secondary | ICD-10-CM | POA: Diagnosis not present

## 2022-01-21 DIAGNOSIS — R7989 Other specified abnormal findings of blood chemistry: Secondary | ICD-10-CM | POA: Diagnosis not present

## 2022-01-21 DIAGNOSIS — J986 Disorders of diaphragm: Secondary | ICD-10-CM | POA: Diagnosis present

## 2022-01-21 DIAGNOSIS — E119 Type 2 diabetes mellitus without complications: Secondary | ICD-10-CM | POA: Insufficient documentation

## 2022-01-21 DIAGNOSIS — Z7985 Long-term (current) use of injectable non-insulin antidiabetic drugs: Secondary | ICD-10-CM

## 2022-01-21 LAB — CBC WITH DIFFERENTIAL/PLATELET
Abs Immature Granulocytes: 0.01 10*3/uL (ref 0.00–0.07)
Basophils Absolute: 0 10*3/uL (ref 0.0–0.1)
Basophils Relative: 1 %
Eosinophils Absolute: 0.1 10*3/uL (ref 0.0–0.5)
Eosinophils Relative: 2 %
HCT: 40.9 % (ref 39.0–52.0)
Hemoglobin: 12.4 g/dL — ABNORMAL LOW (ref 13.0–17.0)
Immature Granulocytes: 0 %
Lymphocytes Relative: 16 %
Lymphs Abs: 1 10*3/uL (ref 0.7–4.0)
MCH: 27.4 pg (ref 26.0–34.0)
MCHC: 30.3 g/dL (ref 30.0–36.0)
MCV: 90.3 fL (ref 80.0–100.0)
Monocytes Absolute: 0.6 10*3/uL (ref 0.1–1.0)
Monocytes Relative: 11 %
Neutro Abs: 4.1 10*3/uL (ref 1.7–7.7)
Neutrophils Relative %: 70 %
Platelets: 242 10*3/uL (ref 150–400)
RBC: 4.53 MIL/uL (ref 4.22–5.81)
RDW: 18.3 % — ABNORMAL HIGH (ref 11.5–15.5)
WBC: 5.8 10*3/uL (ref 4.0–10.5)
nRBC: 0 % (ref 0.0–0.2)

## 2022-01-21 LAB — CBG MONITORING, ED
Glucose-Capillary: 94 mg/dL (ref 70–99)
Glucose-Capillary: 97 mg/dL (ref 70–99)

## 2022-01-21 LAB — BLOOD GAS, ARTERIAL
Acid-Base Excess: 5.1 mmol/L — ABNORMAL HIGH (ref 0.0–2.0)
Bicarbonate: 32.2 mmol/L — ABNORMAL HIGH (ref 20.0–28.0)
O2 Saturation: 94.8 %
Patient temperature: 37
pCO2 arterial: 57 mmHg — ABNORMAL HIGH (ref 32–48)
pH, Arterial: 7.36 (ref 7.35–7.45)
pO2, Arterial: 75 mmHg — ABNORMAL LOW (ref 83–108)

## 2022-01-21 LAB — HEMOGLOBIN A1C
Hgb A1c MFr Bld: 6 % — ABNORMAL HIGH (ref 4.8–5.6)
Mean Plasma Glucose: 125.5 mg/dL

## 2022-01-21 LAB — BASIC METABOLIC PANEL
Anion gap: 14 (ref 5–15)
BUN: 35 mg/dL — ABNORMAL HIGH (ref 6–20)
CO2: 27 mmol/L (ref 22–32)
Calcium: 9 mg/dL (ref 8.9–10.3)
Chloride: 91 mmol/L — ABNORMAL LOW (ref 98–111)
Creatinine, Ser: 8.62 mg/dL — ABNORMAL HIGH (ref 0.61–1.24)
GFR, Estimated: 6 mL/min — ABNORMAL LOW (ref >60)
Glucose, Bld: 82 mg/dL (ref 70–99)
Potassium: 4.5 mmol/L (ref 3.5–5.1)
Sodium: 132 mmol/L — ABNORMAL LOW (ref 135–145)

## 2022-01-21 LAB — D-DIMER, QUANTITATIVE: D-Dimer, Quant: 0.67 ug/mL-FEU — ABNORMAL HIGH (ref 0.00–0.50)

## 2022-01-21 LAB — SARS CORONAVIRUS 2 BY RT PCR: SARS Coronavirus 2 by RT PCR: NEGATIVE

## 2022-01-21 SURGERY — CANCELLED PROCEDURE
Anesthesia: Monitor Anesthesia Care

## 2022-01-21 MED ORDER — NITROGLYCERIN 0.4 MG SL SUBL: 0.4000 mg | SUBLINGUAL_TABLET | SUBLINGUAL | Status: DC | PRN

## 2022-01-21 MED ORDER — SEVELAMER CARBONATE 800 MG PO TABS
1600.0000 mg | ORAL_TABLET | Freq: Three times a day (TID) | ORAL | Status: DC
  Administered 2022-01-23 – 2022-01-25 (×6): 1600 mg via ORAL
  Filled 2022-01-21 (×6): qty 2

## 2022-01-21 MED ORDER — METHOCARBAMOL 500 MG PO TABS
500.0000 mg | ORAL_TABLET | Freq: Three times a day (TID) | ORAL | Status: DC | PRN
Start: 2022-01-21 — End: 2022-01-26

## 2022-01-21 MED ORDER — AMLODIPINE BESYLATE 5 MG PO TABS
5.0000 mg | ORAL_TABLET | Freq: Every day | ORAL | Status: DC
  Administered 2022-01-22 – 2022-01-26 (×5): 5 mg via ORAL
  Filled 2022-01-21 (×5): qty 1

## 2022-01-21 MED ORDER — ATORVASTATIN CALCIUM 40 MG PO TABS
40.0000 mg | ORAL_TABLET | Freq: Every day | ORAL | Status: DC
  Administered 2022-01-22 – 2022-01-26 (×5): 40 mg via ORAL
  Filled 2022-01-21 (×5): qty 1

## 2022-01-21 MED ORDER — PROPOFOL 1000 MG/100ML IV EMUL
INTRAVENOUS | Status: AC
  Filled 2022-01-21: qty 200

## 2022-01-21 MED ORDER — SODIUM CHLORIDE (PF) 0.9 % IJ SOLN
INTRAMUSCULAR | Status: AC
  Filled 2022-01-21: qty 50

## 2022-01-21 MED ORDER — HEPARIN SODIUM (PORCINE) 5000 UNIT/ML IJ SOLN
5000.0000 [IU] | Freq: Three times a day (TID) | INTRAMUSCULAR | Status: DC
  Administered 2022-01-21 – 2022-01-26 (×12): 5000 [IU] via SUBCUTANEOUS
  Filled 2022-01-21 (×12): qty 1

## 2022-01-21 MED ORDER — LATANOPROST 0.005 % OP SOLN
1.0000 [drp] | Freq: Every day | OPHTHALMIC | Status: DC
  Administered 2022-01-22 – 2022-01-25 (×4): 1 [drp] via OPHTHALMIC
  Filled 2022-01-21: qty 2.5

## 2022-01-21 MED ORDER — ACETAMINOPHEN 325 MG PO TABS: 650.0000 mg | ORAL_TABLET | Freq: Four times a day (QID) | ORAL | Status: DC | PRN

## 2022-01-21 MED ORDER — PROPOFOL 10 MG/ML IV BOLUS
INTRAVENOUS | Status: AC
  Filled 2022-01-21: qty 20

## 2022-01-21 MED ORDER — ONDANSETRON HCL 4 MG PO TABS: 4.0000 mg | ORAL_TABLET | Freq: Four times a day (QID) | ORAL | Status: DC | PRN

## 2022-01-21 MED ORDER — IOHEXOL 350 MG/ML SOLN
80.0000 mL | Freq: Once | INTRAVENOUS | Status: AC | PRN
  Administered 2022-01-21: 100 mL via INTRAVENOUS

## 2022-01-21 MED ORDER — PROPOFOL 500 MG/50ML IV EMUL
INTRAVENOUS | Status: AC
  Filled 2022-01-21: qty 50

## 2022-01-21 MED ORDER — ACETAMINOPHEN 650 MG RE SUPP: 650.0000 mg | Freq: Four times a day (QID) | RECTAL | Status: DC | PRN

## 2022-01-21 MED ORDER — ONDANSETRON HCL 4 MG/2ML IJ SOLN: 4.0000 mg | Freq: Four times a day (QID) | INTRAMUSCULAR | Status: DC | PRN

## 2022-01-21 MED ORDER — DORZOLAMIDE HCL-TIMOLOL MAL 2-0.5 % OP SOLN
1.0000 [drp] | Freq: Two times a day (BID) | OPHTHALMIC | Status: DC
  Administered 2022-01-22 – 2022-01-26 (×8): 1 [drp] via OPHTHALMIC
  Filled 2022-01-21 (×2): qty 10

## 2022-01-21 MED ORDER — ASPIRIN 81 MG PO TBEC
81.0000 mg | DELAYED_RELEASE_TABLET | Freq: Every day | ORAL | Status: DC
  Administered 2022-01-22 – 2022-01-26 (×5): 81 mg via ORAL
  Filled 2022-01-21 (×5): qty 1

## 2022-01-21 SURGICAL SUPPLY — 22 items

## 2022-01-21 NOTE — Progress Notes (Signed)
Case cancelled in preop and pt sent to ED for evaluation- came in from home saturating 68-70% on room air, does not use oxygen or inhalers at home per pt (but was once prescribed continuous oxygen at home- pt unsure why he was taken off home oxygen). Saturating 80-50% on 2LPM [place]. Cannot safely anesthetize with such low saturations, needs pulmonary evaluation and likely needs to be on home oxygen again. Concern for decompensated RHF given morbid obesity, ESRD on HD.

## 2022-01-21 NOTE — ED Provider Notes (Signed)
[facility] DEPT Provider Note   CSN: [id number] Arrival date & time: 01/21/22  7867     History  Chief Complaint  Patient presents with   Hypoxia    Adam Dennis is a 61 y.o. male.  Patient is a 61 year old male with a past medical history of ESRD on TTS HD, CAD, OSA, CHF, and diabetes presenting to the emergency department with hypoxia.  The patient states that he had a colonoscopy scheduled this morning for regular screening but when he arrived to the preop area for colonoscopy he was found to be hypoxic.  He states that the procedure was canceled and he was recommended to come to the emergency department for evaluation.  The patient states that he has been feeling well recently without any chest pain or shortness of breath.  He states he has been receiving his regular dialysis sessions.  He denies any fevers or cough.  He was hospitalized in June for 6 days but has had no hospitalizations or surgeries in the last month.  He denies any history of blood clots or blood thinner use.  He denies any lower extremity swelling.  Patient states that he did require oxygen for period of time several years ago and was told that he might have COPD but states that he has not been on any treatments for COPD for several years.  The history is provided by the patient.       Home Medications Prior to Admission medications   Medication Sig Start Date End Date Taking? Authorizing Provider  ACCU-CHEK SOFTCLIX LANCETS lancets Use as instructed to check blood sugar up to 3 times daily. 06/19/18   Charlott Rakes, MD  amLODipine (NORVASC) 5 MG tablet TAKE 1 TABLET(5 MG) BY MOUTH DAILY 12/10/21   Charlott Rakes, MD  aspirin EC 81 MG tablet Take 81 mg by mouth daily.    [provider]  atorvastatin (LIPITOR) 40 MG tablet TAKE 1 TABLET(40 MG) BY MOUTH DAILY 12/10/21   Charlott Rakes, MD  Blood Glucose Monitoring Suppl (ACCU-CHEK AVIVA) device Use as instructed to  check blood sugar up to 3 times daily. 06/19/18   Charlott Rakes, MD  calcium carbonate (TUMS - DOSED IN MG ELEMENTAL CALCIUM) 500 MG chewable tablet Chew 1 tablet (200 mg of elemental calcium total) by mouth 2 (two) times daily. Patient taking differently: Chew 500 mg by mouth 2 (two) times daily as needed for heartburn or indigestion. 11/27/14   Dixie Dials, MD  dorzolamide-timolol (COSOPT) 22.3-6.8 MG/ML ophthalmic solution Place 1 drop into both eyes 2 (two) times daily.    [provider]  Doxercalciferol (HECTOROL IV) Doxercalciferol (Hectorol) 07/31/21 07/30/22  [provider]  Dulaglutide (TRULICITY) 1.5 EH/2.0NO SOPN Inject 1.5 mg into the skin once a week. 08/20/21   Charlott Rakes, MD  FEROSUL 325 (65 Fe) MG tablet TAKE 1 TABLET(325 MG) BY MOUTH DAILY WITH BREAKFAST Patient taking differently: Take 325 mg by mouth Every Tuesday,Thursday,and Saturday with dialysis. 04/11/20   Charlott Rakes, MD  glucose blood (ACCU-CHEK AVIVA PLUS) test strip Use as instructed to check blood sugar up to 3 times daily. 06/19/18   Charlott Rakes, MD  Lancet Devices [facility]) lancets Use as instructed daily. 05/14/17   Charlott Rakes, MD  methocarbamol (ROBAXIN) 500 MG tablet Take 1 tablet (500 mg total) by mouth every 8 (eight) hours as needed for muscle spasms. 11/28/21   Charlott Rakes, MD  Multiple Vitamin (MULTIVITAMIN ADULT) TABS Take 1 tablet by mouth  daily. Patient taking differently: Take 1 tablet by mouth at bedtime. 08/11/19   Charlott Rakes, MD  nitroGLYCERIN (NITROSTAT) 0.4 MG SL tablet PLACE 1 TABLET BY MOUTH UNDER TONGUE EVERY 5 MINUTES FOR 3 DOSES AS NEEDED FOR CHEST PAIN Patient taking differently: Place 0.4 mg under the tongue every 5 (five) minutes as needed for chest pain. 01/29/19   Charlott Rakes, MD  polyethylene glycol powder (GLYCOLAX/MIRALAX) 17 GM/SCOOP powder Take 255 g by mouth daily. Patient taking differently: Take 17 g by mouth daily as needed for mild  constipation. 01/14/22   Ladene Artist, MD  sevelamer carbonate (RENVELA) 800 MG tablet Take 800-1,600 mg by mouth See admin instructions. Take 1600 mg with each meal and 800 mg with each snack 10/30/21   [provider]  TRAVATAN Z 0.004 % SOLN ophthalmic solution Place 1 drop into both eyes at bedtime. 11/01/15   [provider]      Allergies    Patient has no known allergies.    Review of Systems   Review of Systems  Physical Exam Updated Vital Signs Pulse 80   Resp 18   SpO2 96%  Physical Exam Vitals and nursing note reviewed.  Constitutional:      General: He is not in acute distress.    Appearance: Normal appearance. He is obese.  HENT:     Head: Normocephalic and atraumatic.     Nose: Nose normal.     Mouth/Throat:     Mouth: Mucous membranes are moist.     Pharynx: Oropharynx is clear.  Eyes:     Extraocular Movements: Extraocular movements intact.     Conjunctiva/sclera: Conjunctivae normal.  Cardiovascular:     Rate and Rhythm: Normal rate and regular rhythm.     Pulses: Normal pulses.     Heart sounds: Normal heart sounds.  Pulmonary:     Effort: Pulmonary effort is normal.     Breath sounds: Normal breath sounds.  Abdominal:     General: Abdomen is flat.     Palpations: Abdomen is soft.     Tenderness: There is no abdominal tenderness.  Musculoskeletal:        General: Normal range of motion.     Cervical back: Normal range of motion and neck supple.     Right lower leg: No edema.     Left lower leg: No edema.  Skin:    General: Skin is warm and dry.  Neurological:     General: No focal deficit present.     Mental Status: He is alert and oriented to person, place, and time.  Psychiatric:        Mood and Affect: Mood normal.        Behavior: Behavior normal.     ED Results / Procedures / Treatments   Labs (all labs ordered are listed, but only abnormal results are displayed) Labs Reviewed  SARS CORONAVIRUS 2 BY RT PCR  BASIC  METABOLIC PANEL  CBC WITH DIFFERENTIAL/PLATELET  D-DIMER, QUANTITATIVE  BLOOD GAS, ARTERIAL    EKG None  Radiology No results found.  Procedures Procedures    Medications Ordered in ED Medications - No data to display  ED Course/ Medical Decision Making/ A&P Clinical Course as of 01/21/22 1254  Mon Jan 21, 2022  1019 Patient's D-dimer is mildly elevated and he will have a CT PE performed.  He is being titrated down on O2 from 3 L to 2 L and will continue to be titrated.  His labs reviewed and interpreted by myself without significant abnormality.  Chest x-ray with possible atelectasis but otherwise no acute disease to explain his hypoxia [VK]  1148 CTPE negative for PE, evidence of pulmonary hypertension. Patient will attempt to be weaned off O2 and will have ambulatory trial  [VK]  1207 Upon reassessment, I have attempted to wean down the patient's oxygen back to room air, he immediately desatted to the mid 80s, 1 L O2 improved his sat between 88 to 90% and on 2 L he improved to 94%.  Patient will require admission for further work-up for his hypoxia of unknown etiology.  He continues to be well-appearing at this time and lungs continue to be clear to auscultation. [VK]  1254 Patient was accepted by Dr. Olevia Bowens for admission. He will require admission at [facility] for HD. [VK]    Clinical Course User Index [VK] Ottie Glazier, DO                           Medical Decision Making This patient presents to the ED with chief complaint(s) of hypoxia with pertinent past medical history of OSA, CHF, ESRD which further complicates the presenting complaint. The complaint involves an extensive differential diagnosis and also carries with it a high risk of complications and morbidity.    The differential diagnosis includes patient does not appear volume overloaded making CHF exacerbation or volume overload from inadequate HD unlikely cause of his hypoxia, he has had no recent fevers or cough  and has no focal lung sounds making a pneumonia less likely cause of his hypoxia.  He did have a hospitalization in June increasing his risk for PE as a possible cause of his hypoxia, he has equal bilateral breath sounds making pneumothorax less likely  Additional history obtained: Records reviewed previous admission documents and endoscopy notes from this morning  ED Course and Reassessment: Upon patient's arrival to the emergency department he is resting comfortably in no acute distress.  He was initially on room air and his O2 was fluctuating between the 80s to the 90s with a good waveform.  The patient was placed on 3 L nasal cannula.  His lungs have no wheezing on exam making COPD unlikely and he does not appear volume overloaded.  He will have a chest x-ray to evaluate for pulmonary edema, effusion, pneumothorax.  He will be tested for COVID-19 as possible cause of his hypoxia and will have a D-dimer to screen for possible PE in the setting of his hypoxia.  Independent labs interpretation:  The following labs were independently interpreted: Mildly elevated d-dimer, otherwise at baseline  Independent visualization of imaging: - I independently visualized the following imaging with scope of interpretation limited to determining acute life threatening conditions related to emergency care: CXR, CTPE, which revealed atelectasis otherwise no acute disease, negative for PE  Consultation: - Consulted or discussed management/test interpretation w/ external professional: Hospitalist  Consideration for admission or further workup: Patient has persistent hypoxia requiring O2 and will require admission today for further work-up and evaluation Social Determinants of health: N/A    Amount and/or Complexity of Data Reviewed Labs: ordered. Radiology: ordered.  Risk Prescription drug management. Decision regarding hospitalization.           Final Clinical Impression(s) / ED  Diagnoses Final diagnoses:  None    Rx / DC Orders ED Discharge Orders     None  Ottie Glazier, DO 01/21/22 1255

## 2022-01-21 NOTE — Progress Notes (Signed)
Dr. Doroteo Glassman and Dr Fuller Plan state procedure will be cancelled for today and patient to go to ED for evaluation but needs to remain on O2. Call placed to ED charge RN to discuss, she states patient can be taken to ED recess A. Patient and significant other Adam Dennis aware of this and agree with this plan.

## 2022-01-21 NOTE — H&P (Signed)
History and Physical    Patient: Adam Dennis DOB: [birth year] DOA: 01/21/2022 DOS: the patient was seen and examined on 01/21/2022 PCP: Charlott Rakes, MD  Patient coming from: Home  Chief Complaint:  Chief Complaint  Patient presents with   Hypoxia   HPI: Adam Dennis is a 61 y.o. male with medical history significant of osteoarthritis of the back, chronic combined systolic and diastolic CHF, ESRD on HD TTS, COPD, glaucoma, hyperlipidemia, hypertension, hypertrophic and tonsils, CAD, history of MI, class III obesity, type 2 diabetes, OSA on CPAP who was the endoscopic suite this morning getting ready for a colonoscopy, but was found to be hypoxic in the 34s and 70s so was transferred to the emergency department.  He has not missed dialysis.  He is compliant with his CPAP. He denied fever, chills, rhinorrhea, sore throat, wheezing or hemoptysis.  No chest pain, palpitations, diaphoresis, PND, orthopnea, but occasionally has pitting edema of the lower extremities.  No abdominal pain, nausea, emesis, diarrhea, constipation, melena or hematochezia.  No flank pain, dysuria, frequency or hematuria.  No polyuria, polydipsia, polyphagia or blurred vision.   ED course: Initial vital signs were temperature 98.8 F, pulse 80, respiration 18, BP 132/80 mmHg O2 sat 96% on room air.  Lab work: CBC showed a white count 5.8, hemoglobin 12.4 g/dL platelets 242.  D-dimer was 0.67.  ABG showed pH of 7.36, PCO2 of 57, PO2 of 75 mmHg.  Bicarbonate is 32.2 and acid-based SS 5.1 mmol/L.  CMP showed a sodium 132 and chloride of 91 mmol/L.  Normal glucose, calcium, potassium, CO2 and anion gap.  BUN was 35 and creatinine 8.62 mg/dL.  Imaging: Two-view chest radiograph with stable cardiomegaly and a stable elevated left hemidiaphragm.  Minimal bibasal 6 omental atelectasis.  CTA chest with no PE and chronic elevated hemidiaphragms with chronic volume loss at the lung bases.   Review of Systems: As  mentioned in the history of present illness. All other systems reviewed and are negative.  Past Medical History:  Diagnosis Date   Arthritis    "back" (02/06/2017)   CHF (congestive heart failure) ([place])    new onset /Encounter Date 09/12/2006   Chronic combined systolic and diastolic heart failure (HCC)    CKD (chronic kidney disease) stage 4, GFR 15-29 ml/min (HCC)    COPD (chronic obstructive pulmonary disease) (HCC)    End stage renal disease on dialysis [facility])    Tues/ Thur/ Sat [place]   Glaucoma, right eye    High cholesterol    Hypertension    Hypertrophy of tonsils alone    Myocardial infarction ([place])    "small one; ~ 2008" (02/06/2017)   Obesity, unspecified    On home oxygen therapy    "4L; 24/7" (04/10/2017)   OSA on CPAP    Type II diabetes mellitus ([place])    Past Surgical History:  Procedure Laterality Date   AV FISTULA PLACEMENT Right 07/01/2019   Procedure: Right arm arteriovenous fistual;  Surgeon: Waynetta Sandy, MD;  Location: [facility];  Service: Vascular;  Laterality: Right;   FISTULA SUPERFICIALIZATION Right 09/01/2019   Procedure: RIGHT UPPER ARM FISTULA  TRANSPOSITION OF RIGHT UPPPER ARM BRACHIAL CEPHALIC FISTULA;  Surgeon: Waynetta Sandy, MD;  Location: [facility];  Service: Vascular;  Laterality: Right;   HERNIA REPAIR     INSERTION OF DIALYSIS CATHETER Right 07/01/2019   Procedure: INSERTION OF TUNNELED DIALYSIS CATHETER;  Surgeon: Waynetta Sandy, MD;  Location: [facility];  Service: Vascular;  Laterality: Right;   TEE WITHOUT CARDIOVERSION N/A 04/16/2017   Procedure: TRANSESOPHAGEAL ECHOCARDIOGRAM (TEE);  Surgeon: [name], MD;  Location: [facility] END[facility]COPY;  Service: Cardiovascular;  Laterality: N/A;   [place]   "when I was little baby"   Social History:  reports that he has never smoked. He has never used smokeless tobacco. He reports current alcohol use. He reports that he does not use drugs.  No Known  Allergies  Family History  Problem Relation Age of Onset   Hypertension Mother    Diabetes Sister    Colon cancer Sister    Diabetes Brother    Cancer Maternal Aunt    Amblyopia Neg Hx    Blindness Neg Hx    Cataracts Neg Hx    Glaucoma Neg Hx    Macular degeneration Neg Hx    Strabismus Neg Hx    Retinal detachment Neg Hx    Retinitis pigmentosa Neg Hx     Prior to Admission medications   Medication Sig Start Date End Date Taking? Authorizing Provider  ACCU-CHEK SOFTCLIX LANCETS lancets Use as instructed to check blood sugar up to 3 times daily. 06/19/18   Charlott Rakes, MD  amLODipine (NORVASC) 5 MG tablet TAKE 1 TABLET(5 MG) BY MOUTH DAILY 12/10/21   Charlott Rakes, MD  aspirin EC 81 MG tablet Take 81 mg by mouth daily.    [provider]  atorvastatin (LIPITOR) 40 MG tablet TAKE 1 TABLET(40 MG) BY MOUTH DAILY 12/10/21   Charlott Rakes, MD  Blood Glucose Monitoring Suppl (ACCU-CHEK AVIVA) device Use as instructed to check blood sugar up to 3 times daily. 06/19/18   Charlott Rakes, MD  calcium carbonate (TUMS - DOSED IN MG ELEMENTAL CALCIUM) 500 MG chewable tablet Chew 1 tablet (200 mg of elemental calcium total) by mouth 2 (two) times daily. Patient taking differently: Chew 500 mg by mouth 2 (two) times daily as needed for heartburn or indigestion. 11/27/14   Dixie Dials, MD  dorzolamide-timolol (COSOPT) 22.3-6.8 MG/ML ophthalmic solution Place 1 drop into both eyes 2 (two) times daily.    [provider]  Doxercalciferol (HECTOROL IV) Doxercalciferol (Hectorol) 07/31/21 07/30/22  [provider]  Dulaglutide (TRULICITY) 1.5 FI/4.3PI SOPN Inject 1.5 mg into the skin once a week. 08/20/21   Charlott Rakes, MD  FEROSUL 325 (65 Fe) MG tablet TAKE 1 TABLET(325 MG) BY MOUTH DAILY WITH BREAKFAST Patient taking differently: Take 325 mg by mouth Every Tuesday,Thursday,and Saturday with dialysis. 04/11/20   Charlott Rakes, MD  glucose blood (ACCU-CHEK AVIVA PLUS)  test strip Use as instructed to check blood sugar up to 3 times daily. 06/19/18   Charlott Rakes, MD  Lancet Devices [facility]) lancets Use as instructed daily. 05/14/17   Charlott Rakes, MD  methocarbamol (ROBAXIN) 500 MG tablet Take 1 tablet (500 mg total) by mouth every 8 (eight) hours as needed for muscle spasms. 11/28/21   Charlott Rakes, MD  Multiple Vitamin (MULTIVITAMIN ADULT) TABS Take 1 tablet by mouth daily. Patient taking differently: Take 1 tablet by mouth at bedtime. 08/11/19   Charlott Rakes, MD  nitroGLYCERIN (NITROSTAT) 0.4 MG SL tablet PLACE 1 TABLET BY MOUTH UNDER TONGUE EVERY 5 MINUTES FOR 3 DOSES AS NEEDED FOR CHEST PAIN Patient taking differently: Place 0.4 mg under the tongue every 5 (five) minutes as needed for chest pain. 01/29/19   Charlott Rakes, MD  polyethylene glycol powder (GLYCOLAX/MIRALAX) 17 GM/SCOOP powder Take 255 g by mouth daily.  Patient taking differently: Take 17 g by mouth daily as needed for mild constipation. 01/14/22   Ladene Artist, MD  sevelamer carbonate (RENVELA) 800 MG tablet Take 800-1,600 mg by mouth See admin instructions. Take 1600 mg with each meal and 800 mg with each snack 10/30/21   [provider]  TRAVATAN Z 0.004 % SOLN ophthalmic solution Place 1 drop into both eyes at bedtime. 11/01/15   [provider]    Physical Exam: Vitals:   01/21/22 1030 01/21/22 1145 01/21/22 1200 01/21/22 1230  BP: (!) 115/54 128/74 119/73 121/67  Pulse: 84 78 82 89  Resp: (!) 22 (!) 28 (!) 22 (!) 25  Temp:      TempSrc:      SpO2: 92% 94% 93% 90%   Physical Exam Vitals and nursing note reviewed.  Constitutional:      General: He is awake. He is not in acute distress.    Appearance: Normal appearance. He is obese.  HENT:     Head: Normocephalic.     Nose: No rhinorrhea.     Mouth/Throat:     Mouth: Mucous membranes are moist.  Eyes:     General: No scleral icterus.    Pupils: Pupils are equal, round, and reactive to  light.  Neck:     Vascular: No JVD.  Cardiovascular:     Rate and Rhythm: Normal rate and regular rhythm.     Heart sounds: S1 normal and S2 normal.  Pulmonary:     Effort: Pulmonary effort is normal. No respiratory distress.     Breath sounds: Normal breath sounds. No wheezing or rhonchi.  Abdominal:     General: Bowel sounds are normal. There is no distension.     Palpations: Abdomen is soft.     Tenderness: There is no abdominal tenderness. There is no guarding or rebound.  Musculoskeletal:     Cervical back: Neck supple.     Right lower leg: No edema.     Left lower leg: No edema.     Comments: Stage I lymphedema.  Skin:    General: Skin is warm and dry.  Neurological:     General: No focal deficit present.     Mental Status: He is alert and oriented to person, place, and time.  Psychiatric:        Mood and Affect: Mood normal.        Behavior: Behavior normal. Behavior is cooperative.   Data Reviewed:  There are no new results to review at this time.  Assessment and Plan: Principal Problem:   Acute respiratory failure with hypoxia ([place]) In the setting of:   Chronically elevated hemidiaphragm bilaterally With:   Atelectasis of both lungs Due to:   Obesity hypoventilation syndrome (HCC) Observation/PCU. Continue supplemental oxygen.  Bronchodilators as needed. Incentive spirometry. Continue CPAP at bedtime. Check ABG in the morning. Consult pulmonology in AM.  Active Problems:   OSA (obstructive sleep apnea) Continue CPAP at bedtime.    Hyperlipidemia Continue atorvastatin 40 mg p.o. daily.    Morbid obesity (HCC) BMI 45.33 kg/m. Lifestyle modifications. Follow-up with PCP.    Essential hypertension, benign Continue amlodipine 5 mg p.o. daily. Monitor BP and heart rate.    Diabetes mellitus type 2 in obese (HCC) Carbohydrate modified diet. CBG monitoring before meals and bedtime. Hold insulin given normal or just mildly elevated glucose.     Glaucoma of right eye Continue Cosopt and Xalatan drops    ESRD on dialysis ([place])  Consult nephrology once at [place] as usual.       Advance Care Planning:   Code Status: Prior   Consults:   Family Communication:   Severity of Illness: The appropriate patient status for this patient is OBSERVATION. Observation status is judged to be reasonable and necessary in order to provide the required intensity of service to ensure the patient's safety. The patient's presenting symptoms, physical exam findings, and initial radiographic and laboratory data in the context of their medical condition is felt to place them at decreased risk for further clinical deterioration. Furthermore, it is anticipated that the patient will be medically stable for discharge from the hospital within 2 midnights of admission.   Author: Reubin Milan, MD 01/21/2022 1:16 PM  For on call review [email or web address].   This document was prepared using Dragon voice recognition software and may contain some unintended transcription errors.

## 2022-01-21 NOTE — Progress Notes (Signed)
Patient taken to ED via stretcher on [facility]. O2sat remains at 91%, no respiratory difficulty at this time. Clothing and shoes remain with patient. Report given to ED RN Crystal.

## 2022-01-21 NOTE — ED Triage Notes (Signed)
Pt here from endo where pt was found to be 68% on room air on arrival to their department. Pt placed on 2L [place] with improvement to 91% after approx 10 minutes. Pt with no complaints. T, TH, S dialysis patient, last dialyzed on Saturday. Pt in NAD on arrival to the ED.

## 2022-01-21 NOTE — Progress Notes (Signed)
Patient arrived to endoscopy for outpatient colonoscopy today, O2 sat 68% on room air. Patient denies respiratory difficulty at this time, RR30 but otherwise without any obvious respiratory issue. Patient able to ambulate, change clothes and use restroom without issue. Patient placed on [facility]. Dr Doroteo Glassman called, she came to see patient at bedside. O2 sat 83% on [facility] on her arrival. She will discuss situation with proceduralist Dr Fuller Plan.

## 2022-01-21 NOTE — ED Notes (Addendum)
Unable to walk pt do to pt oxygen was 79% just sitting on the side of the bed

## 2022-01-21 NOTE — Anesthesia Preprocedure Evaluation (Addendum)
Anesthesia Evaluation    Reviewed: Allergy & Precautions, Patient's Chart, lab work & pertinent test results  Airway        Dental   Pulmonary sleep apnea and Continuous Positive Airway Pressure Ventilation , COPD (4LPM [place] 24/7),  COPD inhaler and oxygen dependent,           Cardiovascular hypertension, Pt. on medications + Past MI (2008)    Echo 10/2021 1. Left ventricular ejection fraction, by estimation, is 50 to 55%. The  left ventricle has normal function. The left ventricle has no regional  wall motion abnormalities. Left ventricular diastolic parameters are  indeterminate.  2. Right ventricular systolic function is normal. The right ventricular  size is normal.  3. Left atrial size was mildly dilated.  4. The mitral valve is normal in structure. Trivial mitral valve  regurgitation.  5. The aortic valve is normal in structure. Aortic valve regurgitation is  not visualized. Aortic valve sclerosis is present, with no evidence of  aortic valve stenosis.  6. The inferior vena cava is normal in size with greater than 50%  respiratory variability, suggesting right atrial pressure of 3 mmHg.    Neuro/Psych PSYCHIATRIC DISORDERS Depression negative neurological ROS     GI/Hepatic negative GI ROS, Neg liver ROS,   Endo/Other  diabetes, Well Controlled, Type 2, Oral Hypoglycemic AgentsLast a1c 5.8  Renal/GU ESRF and DialysisRenal diseaseT/Th/Sat HD   negative genitourinary   Musculoskeletal  (+) Arthritis , Osteoarthritis,    Abdominal   Peds  Hematology negative hematology ROS (+)   Anesthesia Other Findings   Reproductive/Obstetrics negative OB ROS                             Anesthesia Physical Anesthesia Plan  ASA: 3  Anesthesia Plan: MAC   Post-op Pain Management:    Induction:   PONV Risk Score and Plan: 2 and Propofol infusion and TIVA  Airway Management Planned:  Natural Airway and Simple Face Mask  Additional Equipment: None  Intra-op Plan:   Post-operative Plan:   Informed Consent:   Plan Discussed with:   Anesthesia Plan Comments: (Case cancelled in preop and pt sent to ED for evaluation- came in from home saturating 68-70% on room air, does not use oxygen or inhalers at home per pt (but was once prescribed continuous oxygen at home- pt unsure why he was taken off home oxygen). Saturating 80-50% on 2LPM [place]. Cannot safely anesthetize with such low saturations, needs pulmonary evaluation and likely needs to be on home oxygen again. Concern for decompensated RHF given morbid obesity, ESRD on HD.)       Anesthesia Quick Evaluation

## 2022-01-22 ENCOUNTER — Other Ambulatory Visit: Payer: Self-pay

## 2022-01-22 ENCOUNTER — Encounter (HOSPITAL_COMMUNITY): Payer: Self-pay | Admitting: Internal Medicine

## 2022-01-22 DIAGNOSIS — E1165 Type 2 diabetes mellitus with hyperglycemia: Secondary | ICD-10-CM | POA: Diagnosis present

## 2022-01-22 DIAGNOSIS — N250 Renal osteodystrophy: Secondary | ICD-10-CM | POA: Diagnosis not present

## 2022-01-22 DIAGNOSIS — H4010X0 Unspecified open-angle glaucoma, stage unspecified: Secondary | ICD-10-CM | POA: Diagnosis not present

## 2022-01-22 DIAGNOSIS — J9811 Atelectasis: Secondary | ICD-10-CM | POA: Diagnosis present

## 2022-01-22 DIAGNOSIS — J9601 Acute respiratory failure with hypoxia: Secondary | ICD-10-CM

## 2022-01-22 DIAGNOSIS — Z7982 Long term (current) use of aspirin: Secondary | ICD-10-CM | POA: Diagnosis not present

## 2022-01-22 DIAGNOSIS — E1169 Type 2 diabetes mellitus with other specified complication: Secondary | ICD-10-CM

## 2022-01-22 DIAGNOSIS — E7800 Pure hypercholesterolemia, unspecified: Secondary | ICD-10-CM | POA: Diagnosis present

## 2022-01-22 DIAGNOSIS — E669 Obesity, unspecified: Secondary | ICD-10-CM

## 2022-01-22 DIAGNOSIS — E662 Morbid (severe) obesity with alveolar hypoventilation: Secondary | ICD-10-CM | POA: Diagnosis present

## 2022-01-22 DIAGNOSIS — Z992 Dependence on renal dialysis: Secondary | ICD-10-CM

## 2022-01-22 DIAGNOSIS — I5042 Chronic combined systolic (congestive) and diastolic (congestive) heart failure: Secondary | ICD-10-CM | POA: Diagnosis present

## 2022-01-22 DIAGNOSIS — E1122 Type 2 diabetes mellitus with diabetic chronic kidney disease: Secondary | ICD-10-CM | POA: Diagnosis present

## 2022-01-22 DIAGNOSIS — J441 Chronic obstructive pulmonary disease with (acute) exacerbation: Secondary | ICD-10-CM | POA: Diagnosis present

## 2022-01-22 DIAGNOSIS — Z6841 Body Mass Index (BMI) 40.0 and over, adult: Secondary | ICD-10-CM | POA: Diagnosis not present

## 2022-01-22 DIAGNOSIS — Z9981 Dependence on supplemental oxygen: Secondary | ICD-10-CM | POA: Diagnosis not present

## 2022-01-22 DIAGNOSIS — Z20822 Contact with and (suspected) exposure to covid-19: Secondary | ICD-10-CM | POA: Diagnosis present

## 2022-01-22 DIAGNOSIS — J9622 Acute and chronic respiratory failure with hypercapnia: Secondary | ICD-10-CM | POA: Diagnosis present

## 2022-01-22 DIAGNOSIS — E871 Hypo-osmolality and hyponatremia: Secondary | ICD-10-CM | POA: Diagnosis present

## 2022-01-22 DIAGNOSIS — I132 Hypertensive heart and chronic kidney disease with heart failure and with stage 5 chronic kidney disease, or end stage renal disease: Secondary | ICD-10-CM | POA: Diagnosis present

## 2022-01-22 DIAGNOSIS — I10 Essential (primary) hypertension: Secondary | ICD-10-CM

## 2022-01-22 DIAGNOSIS — N186 End stage renal disease: Secondary | ICD-10-CM

## 2022-01-22 DIAGNOSIS — I2510 Atherosclerotic heart disease of native coronary artery without angina pectoris: Secondary | ICD-10-CM | POA: Diagnosis present

## 2022-01-22 DIAGNOSIS — H409 Unspecified glaucoma: Secondary | ICD-10-CM | POA: Diagnosis present

## 2022-01-22 DIAGNOSIS — N2581 Secondary hyperparathyroidism of renal origin: Secondary | ICD-10-CM | POA: Diagnosis present

## 2022-01-22 DIAGNOSIS — R0902 Hypoxemia: Secondary | ICD-10-CM | POA: Diagnosis present

## 2022-01-22 DIAGNOSIS — Z833 Family history of diabetes mellitus: Secondary | ICD-10-CM | POA: Diagnosis not present

## 2022-01-22 DIAGNOSIS — I252 Old myocardial infarction: Secondary | ICD-10-CM | POA: Diagnosis not present

## 2022-01-22 DIAGNOSIS — I509 Heart failure, unspecified: Secondary | ICD-10-CM | POA: Diagnosis not present

## 2022-01-22 DIAGNOSIS — Z79899 Other long term (current) drug therapy: Secondary | ICD-10-CM | POA: Diagnosis not present

## 2022-01-22 DIAGNOSIS — D631 Anemia in chronic kidney disease: Secondary | ICD-10-CM | POA: Diagnosis present

## 2022-01-22 DIAGNOSIS — J9621 Acute and chronic respiratory failure with hypoxia: Secondary | ICD-10-CM | POA: Diagnosis present

## 2022-01-22 LAB — CBC
HCT: 39.7 % (ref 39.0–52.0)
Hemoglobin: 12.2 g/dL — ABNORMAL LOW (ref 13.0–17.0)
MCH: 27.1 pg (ref 26.0–34.0)
MCHC: 30.7 g/dL (ref 30.0–36.0)
MCV: 88 fL (ref 80.0–100.0)
Platelets: 263 10*3/uL (ref 150–400)
RBC: 4.51 MIL/uL (ref 4.22–5.81)
RDW: 17.8 % — ABNORMAL HIGH (ref 11.5–15.5)
WBC: 5.6 10*3/uL (ref 4.0–10.5)
nRBC: 0 % (ref 0.0–0.2)

## 2022-01-22 LAB — COMPREHENSIVE METABOLIC PANEL
ALT: 16 U/L (ref 0–44)
AST: 18 U/L (ref 15–41)
Albumin: 3 g/dL — ABNORMAL LOW (ref 3.5–5.0)
Alkaline Phosphatase: 50 U/L (ref 38–126)
Anion gap: 13 (ref 5–15)
BUN: 43 mg/dL — ABNORMAL HIGH (ref 6–20)
CO2: 27 mmol/L (ref 22–32)
Calcium: 9.1 mg/dL (ref 8.9–10.3)
Chloride: 93 mmol/L — ABNORMAL LOW (ref 98–111)
Creatinine, Ser: 11.15 mg/dL — ABNORMAL HIGH (ref 0.61–1.24)
GFR, Estimated: 5 mL/min — ABNORMAL LOW (ref >60)
Glucose, Bld: 99 mg/dL (ref 70–99)
Potassium: 4.8 mmol/L (ref 3.5–5.1)
Sodium: 133 mmol/L — ABNORMAL LOW (ref 135–145)
Total Bilirubin: 0.7 mg/dL (ref 0.3–1.2)
Total Protein: 7.7 g/dL (ref 6.5–8.1)

## 2022-01-22 LAB — BLOOD GAS, ARTERIAL
Acid-Base Excess: 2.2 mmol/L — ABNORMAL HIGH (ref 0.0–2.0)
Bicarbonate: 31.6 mmol/L — ABNORMAL HIGH (ref 20.0–28.0)
Drawn by: 34762
O2 Saturation: 62.3 %
Patient temperature: 36.3
pCO2 arterial: 70 mmHg (ref 32–48)
pH, Arterial: 7.26 — ABNORMAL LOW (ref 7.35–7.45)
pO2, Arterial: 40 mmHg — CL (ref 83–108)

## 2022-01-22 LAB — GLUCOSE, CAPILLARY: Glucose-Capillary: 117 mg/dL — ABNORMAL HIGH (ref 70–99)

## 2022-01-22 LAB — HEPATITIS C ANTIBODY: HCV Ab: NONREACTIVE

## 2022-01-22 LAB — HEPATITIS B SURFACE ANTIGEN: Hepatitis B Surface Ag: NONREACTIVE

## 2022-01-22 LAB — HEPATITIS B CORE ANTIBODY, TOTAL: Hep B Core Total Ab: NONREACTIVE

## 2022-01-22 LAB — HEPATITIS B SURFACE ANTIBODY,QUALITATIVE: Hep B S Ab: REACTIVE — AB

## 2022-01-22 MED ORDER — HEPARIN SODIUM (PORCINE) 1000 UNIT/ML IJ SOLN
INTRAMUSCULAR | Status: AC
  Administered 2022-01-22: 5000 [IU]
  Filled 2022-01-22: qty 5

## 2022-01-22 MED ORDER — SEVELAMER CARBONATE 800 MG PO TABS
800.0000 mg | ORAL_TABLET | ORAL | Status: DC | PRN
  Filled 2022-01-22 (×2): qty 1

## 2022-01-22 MED ORDER — DOXERCALCIFEROL 4 MCG/2ML IV SOLN
7.0000 ug | INTRAVENOUS | Status: DC
  Administered 2022-01-24: 7 ug via INTRAVENOUS
  Filled 2022-01-22 (×3): qty 4

## 2022-01-22 MED ORDER — CHLORHEXIDINE GLUCONATE CLOTH 2 % EX PADS: 6.0000 | MEDICATED_PAD | Freq: Every day | CUTANEOUS | Status: DC

## 2022-01-22 NOTE — Assessment & Plan Note (Addendum)
Hyponatremia.   Na 133, K 4,8 and serum bicarbonate 27.   Patient underwent HD with good toleration Follow up with HD as outpatient if he goes home tomorrow.  Follow up electrolytes in am

## 2022-01-22 NOTE — Progress Notes (Signed)
RT Note: ABG results appear to be venous. Patient is alert, oriented x3.

## 2022-01-22 NOTE — Consult Note (Signed)
[facility] Renal Consultation Note    Indication for Consultation:  Management of ESRD/hemodialysis, anemia, hypertension/volume, and secondary hyperparathyroidism.  HPI: Adam Dennis is a 61 y.o. male with PMH including ESRD on dialysis TTS, CHF, COPD (reports he was on O2 for about 1 year but has been off of it for 2-3 years), HTN, and MI who presented to [facility] ED with hypoxia. Patient reports he went to his colonoscopy appointment Monday morning but his O2 saturations were low so they advised him to go to the ED. He did not feel particularly short of breath but does report he drank more than usual for his bowel prep. CBC without leukocytosis. CTA chest with chronic elevated hemidiaphragms but no PE or edema. Patient does use CPAP at bedtime. He reports he is feeling well today, no SOB but is on O2. Denies CP, palpitations, dizziness, abdominal pain, nausea, vomiting, edema, orthopnea, PND, fever/chills. No recent sick contacts. Had diarrhea with bowel prep but none at present. Reports he is complaint with HD and left close to his EDW on Saturday.    Past Medical History:  Diagnosis Date   Arthritis    "back" (02/06/2017)   CHF (congestive heart failure) ([place])    new onset /Encounter Date 09/12/2006   Chronic combined systolic and diastolic heart failure (HCC)    CKD (chronic kidney disease) stage 4, GFR 15-29 ml/min (HCC)    COPD (chronic obstructive pulmonary disease) (HCC)    End stage renal disease on dialysis [facility])    Tues/ Thur/ Sat [place]   Glaucoma, right eye    High cholesterol    Hypertension    Hypertrophy of tonsils alone    Myocardial infarction ([place])    "small one; ~ 2008" (02/06/2017)   Obesity, unspecified    On home oxygen therapy    "4L; 24/7" (04/10/2017)   OSA on CPAP    Type II diabetes mellitus ([place])    Past Surgical History:  Procedure Laterality Date   AV FISTULA PLACEMENT Right 07/01/2019   Procedure: Right arm arteriovenous  fistual;  Surgeon: Waynetta Sandy, MD;  Location: [place];  Service: Vascular;  Laterality: Right;   FISTULA SUPERFICIALIZATION Right 09/01/2019   Procedure: RIGHT UPPER ARM FISTULA  TRANSPOSITION OF RIGHT UPPPER ARM BRACHIAL CEPHALIC FISTULA;  Surgeon: Waynetta Sandy, MD;  Location: [place];  Service: Vascular;  Laterality: Right;   HERNIA REPAIR     INSERTION OF DIALYSIS CATHETER Right 07/01/2019   Procedure: INSERTION OF TUNNELED DIALYSIS CATHETER;  Surgeon: Waynetta Sandy, MD;  Location: [place];  Service: Vascular;  Laterality: Right;   TEE WITHOUT CARDIOVERSION N/A 04/16/2017   Procedure: TRANSESOPHAGEAL ECHOCARDIOGRAM (TEE);  Surgeon: [name], MD;  Location[facility] ENDOSCOPY;  Service: Cardiovascular;  Laterality: N/A;   UMBILICAL HERNIA REPAIR  1960s   "when I was little baby"   Family History  Problem Relation Age of Onset   Hypertension Mother    Diabetes Sister    Colon cancer Sister    Diabetes Brother    Cancer Maternal Aunt    Amblyopia Neg Hx    Blindness Neg Hx    Cataracts Neg Hx    Glaucoma Neg Hx    Macular degeneration Neg Hx    Strabismus Neg Hx    Retinal detachment Neg Hx    Retinitis pigmentosa Neg Hx    Social History:  reports that he has never smoked. He has never used smokeless tobacco. He reports  current alcohol use. He reports that he does not use drugs.  ROS: As per HPI otherwise negative.  Physical Exam: Vitals:   01/22/22 0400 01/22/22 0440 01/22/22 0444 01/22/22 0514  BP: 138/81 138/81  (!) 157/81  Pulse: 81 81  80  Resp: (!) '26 20  19  '$ Temp:  98.5 F (36.9 C) 98.4 F (36.9 C) (!) 97.3 F (36.3 C)  TempSrc:    Oral  SpO2: 96%   99%  Weight:    (!) 149.1 kg  Height:    6' (1.829 m)     General: Well developed, well nourished, in no acute distress. Head: Normocephalic, atraumatic, sclera non-icteric, mucus membranes are moist. Neck: JVD not elevated. Lungs: Clear bilaterally to auscultation without wheezes,  rales, or rhonchi. Breathing is unlabored on O2 via [place] Heart: RRR with normal S1, S2. No murmurs, rubs, or gallops appreciated. Abdomen: Soft, non-tender, non-distended with normoactive bowel sounds. No obvious abdominal masses. Musculoskeletal:  Strength and tone appear normal for age. Lower extremities: No edema b/l lower extremities Neuro: Alert and oriented X 3. Moves all extremities spontaneously. Psych:  Responds to questions appropriately with a normal affect. Dialysis Access:  No Known Allergies Prior to Admission medications   Medication Sig Start Date End Date Taking? Authorizing Provider  amLODipine (NORVASC) 5 MG tablet TAKE 1 TABLET(5 MG) BY MOUTH DAILY Patient taking differently: Take 5 mg by mouth daily. 12/10/21  Yes Charlott Rakes, MD  aspirin EC 81 MG tablet Take 81 mg by mouth daily.   Yes [provider]  atorvastatin (LIPITOR) 40 MG tablet TAKE 1 TABLET(40 MG) BY MOUTH DAILY Patient taking differently: Take 40 mg by mouth daily. 12/10/21  Yes Charlott Rakes, MD  calcium carbonate (TUMS - DOSED IN MG ELEMENTAL CALCIUM) 500 MG chewable tablet Chew 1 tablet (200 mg of elemental calcium total) by mouth 2 (two) times daily. Patient taking differently: Chew 500 mg by mouth 2 (two) times daily as needed for heartburn or indigestion. 11/27/14  Yes Dixie Dials, MD  dorzolamide-timolol (COSOPT) 22.3-6.8 MG/ML ophthalmic solution Place 1 drop into both eyes 2 (two) times daily.   Yes [provider]  Doxercalciferol (HECTOROL IV) Doxercalciferol (Hectorol) 07/31/21 07/30/22 Yes [provider]  Dulaglutide (TRULICITY) 1.5 FM/3.8GY SOPN Inject 1.5 mg into the skin once a week. 08/20/21  Yes Newlin, Charlane Ferretti, MD  FEROSUL 325 (65 Fe) MG tablet TAKE 1 TABLET(325 MG) BY MOUTH DAILY WITH BREAKFAST Patient taking differently: Take 325 mg by mouth Every Tuesday,Thursday,and Saturday with dialysis. 04/11/20  Yes Charlott Rakes, MD  methocarbamol (ROBAXIN) 500 MG tablet  Take 1 tablet (500 mg total) by mouth every 8 (eight) hours as needed for muscle spasms. 11/28/21  Yes Charlott Rakes, MD  Multiple Vitamin (MULTIVITAMIN ADULT) TABS Take 1 tablet by mouth daily. Patient taking differently: Take 1 tablet by mouth at bedtime. 08/11/19  Yes Newlin, Charlane Ferretti, MD  nitroGLYCERIN (NITROSTAT) 0.4 MG SL tablet PLACE 1 TABLET BY MOUTH UNDER TONGUE EVERY 5 MINUTES FOR 3 DOSES AS NEEDED FOR CHEST PAIN Patient taking differently: Place 0.4 mg under the tongue every 5 (five) minutes as needed for chest pain. 01/29/19  Yes Charlott Rakes, MD  sevelamer carbonate (RENVELA) 800 MG tablet Take 800-1,600 mg by mouth See admin instructions. Take 1600 mg with each meal and 800 mg with each snack 10/30/21  Yes [provider]  TRAVATAN Z 0.004 % SOLN ophthalmic solution Place 1 drop into both eyes at bedtime. 11/01/15  Yes [provider]  ACCU-CHEK SOFTCLIX LANCETS lancets Use as instructed to check blood sugar up to 3 times daily. 06/19/18   Charlott Rakes, MD  Blood Glucose Monitoring Suppl (ACCU-CHEK AVIVA) device Use as instructed to check blood sugar up to 3 times daily. 06/19/18   Charlott Rakes, MD  glucose blood (ACCU-CHEK AVIVA PLUS) test strip Use as instructed to check blood sugar up to 3 times daily. 06/19/18   Charlott Rakes, MD  Lancet Devices [facility]) lancets Use as instructed daily. 05/14/17   Charlott Rakes, MD   Current Facility-Administered Medications  Medication Dose Route Frequency Provider Last Rate Last Admin   acetaminophen (TYLENOL) tablet 650 mg  650 mg Oral Q6H PRN Reubin Milan, MD       Or   acetaminophen (TYLENOL) suppository 650 mg  650 mg Rectal Q6H PRN Reubin Milan, MD       amLODipine (NORVASC) tablet 5 mg  5 mg Oral Daily Reubin Milan, MD   5 mg at 01/22/22 9509   aspirin EC tablet 81 mg  81 mg Oral Daily Reubin Milan, MD   81 mg at 01/22/22 3267   atorvastatin (LIPITOR) tablet 40 mg  40 mg Oral  Daily Reubin Milan, MD   40 mg at 01/22/22 1245   Chlorhexidine Gluconate Cloth 2 % PADS 6 each  6 each Topical Q0600 Janalee Dane, PA-C       dorzolamide-timolol (COSOPT) 22.3-6.8 MG/ML ophthalmic solution 1 drop  1 drop Both Eyes BID Reubin Milan, MD       heparin injection 5,000 Units  5,000 Units Subcutaneous Q8H Reubin Milan, MD   5,000 Units at 01/22/22 0714   latanoprost (XALATAN) 0.005 % ophthalmic solution 1 drop  1 drop Both Eyes QHS Reubin Milan, MD       methocarbamol (ROBAXIN) tablet 500 mg  500 mg Oral Q8H PRN Reubin Milan, MD       nitroGLYCERIN (NITROSTAT) SL tablet 0.4 mg  0.4 mg Sublingual Q5 min PRN Reubin Milan, MD       ondansetron [facility]) tablet 4 mg  4 mg Oral Q6H PRN Reubin Milan, MD       Or   ondansetron [facility]) injection 4 mg  4 mg Intravenous Q6H PRN Reubin Milan, MD       sevelamer carbonate (RENVELA) tablet 1,600 mg  1,600 mg Oral TID WC Reubin Milan, MD       sevelamer carbonate (RENVELA) tablet 800 mg  800 mg Oral PRN Reubin Milan, MD       Labs: Basic Metabolic Panel: Recent Labs  Lab 01/21/22 0845 01/22/22 0530  NA 132* 133*  K 4.5 4.8  CL 91* 93*  CO2 27 27  GLUCOSE 82 99  BUN 35* 43*  CREATININE 8.62* 11.15*  CALCIUM 9.0 9.1   Liver Function Tests: Recent Labs  Lab 01/22/22 0530  AST 18  ALT 16  ALKPHOS 50  BILITOT 0.7  PROT 7.7  ALBUMIN 3.0*   No results for input(s): "LIPASE", "AMYLASE" in the last 168 hours. No results for input(s): "AMMONIA" in the last 168 hours. CBC: Recent Labs  Lab 01/21/22 0845 01/22/22 0530  WBC 5.8 5.6  NEUTROABS 4.1  --   HGB 12.4* 12.2*  HCT 40.9 39.7  MCV 90.3 88.0  PLT 242 263   Cardiac Enzymes: No results for input(s): "CKTOTAL", "CKMB", "CKMBINDEX", "TROPONINI" in the last 168 hours. CBG: Recent Labs  Lab 01/21/22 1724 01/21/22 2035  GLUCAP 94 97   Iron Studies: No results for input(s): "IRON", "TIBC",  "TRANSFERRIN", "FERRITIN" in the last 72 hours. Studies/Results: CT Angio Chest PE W and/or Wo Contrast  Result Date: 01/21/2022 CLINICAL DATA:  Pulmonary embolism suspected. Positive D-dimer. Poor oxygen saturation. EXAM: CT ANGIOGRAPHY CHEST WITH CONTRAST TECHNIQUE: Multidetector CT imaging of the chest was performed using the standard protocol during bolus administration of intravenous contrast. Multiplanar CT image reconstructions and MIPs were obtained to evaluate the vascular anatomy. RADIATION DOSE REDUCTION: This exam was performed according to the departmental dose-optimization program which includes automated exposure control, adjustment of the mA and/or kV according to patient size and/or use of iterative reconstruction technique. CONTRAST:  153m OMNIPAQUE IOHEXOL 350 MG/ML SOLN COMPARISON:  Chest radiography same day.  CT angiography 11/10/2021 FINDINGS: Cardiovascular: Heart size is normal. Minimal coronary artery calcification is seen. Minimal aortic atherosclerotic calcification is seen. Pulmonary arterial opacification is satisfactory. No pulmonary emboli. Prominent pulmonary arteries which could go along with chronic pulmonary arterial hypertension. Mediastinum/Nodes: No lymphadenopathy. 4 cm left thyroid nodule as seen previously and evaluated by ultrasound. See results of thyroid ultrasound 11/11/2021. Lungs/Pleura: Chronically elevated hemidiaphragms with mild chronic volume loss at both lung bases. Upper Abdomen: Negative Musculoskeletal: Chronic spinal degenerative changes. Review of the MIP images confirms the above findings. IMPRESSION: No pulmonary emboli. Chronically elevated hemidiaphragms with chronic volume loss at the lung bases. Pulmonary arterial prominence could go along with pulmonary arterial hypertension. Minimal coronary artery calcification and aortic atherosclerotic calcification. Electronically Signed   By: MNelson ChimesM.D.   On: 01/21/2022 11:24   DG Chest 2  View  Result Date: 01/21/2022 CLINICAL DATA:  Hypoxia. EXAM: CHEST - 2 VIEW COMPARISON:  November 09, 2021. FINDINGS: Stable cardiomegaly. Stable elevated left hemidiaphragm is noted. Probable minimal bibasilar subsegmental atelectasis is noted. Bony thorax is unremarkable. IMPRESSION: Stable cardiomegaly and stable elevated left hemidiaphragm. Probable minimal bibasilar subsegmental atelectasis. Electronically Signed   By: JMarijo ConceptionM.D.   On: 01/21/2022 09:27    Dialysis Orders:  [facility] 4 hrs 15 min, Optiflux 200NRe, BFR 400, DFR Autoflow 1.5, EDW 146.5 (kg), Dialysate 2.0 K, 2.0 Ca, Heparin 10200 unit bolus Hectorol 766m IV q HD  Assessment/Plan:  Hypoxia: possibly multifactorial, hx COPD and chronically elevated hemidiaphragms. May also be a bit volume overloaded from bowel prep. Will see if symptoms improve with HD today  ESRD:  TTS schedule, will dialyze today  Hypertension/volume: See above, BP controlled, no volume overload on exam/imaging but is SOB. UF as tolerated today.  Anemia: Hb 12.2. No ESA indicated  Metabolic bone disease: Calcium controlled, will check phos. Continue hectorol and binders  Nutrition:  renal diet with fluid restrictions  SaAnice PaganiniPA-C 01/22/2022, 9:43 AM  [facility] Pager: [phone]

## 2022-01-22 NOTE — Progress Notes (Signed)
RT Note:  Patient transferred from [facility] Emergency Department. Patient states he did not wear CPAP last night. ABG drawn on 5L [place]. sent to lab.

## 2022-01-22 NOTE — Assessment & Plan Note (Signed)
Continue blood pressure control with amlodipine.  

## 2022-01-22 NOTE — Assessment & Plan Note (Signed)
Possible hypoventilation, OHS. Currently he is on 2 L/min per [place] with 02 saturation 96%.  Plan to continue oxymetry monitoring  Wean off supplemental 02 to room air, keep 02 saturation 92% or greater.  Continue with C pap at night.  Check VBG in am.  If C02 retention may have to upgrade his cpap to a bipap.

## 2022-01-22 NOTE — Progress Notes (Signed)
CPAP set up at bedside 15.0 cm H20 (per pt home settings) decreased to 12.0 as patient states too much pressure. Patient aware to call for assistance when needed. Currently on 5L [place].

## 2022-01-22 NOTE — Progress Notes (Signed)
  Progress Note   Patient: Adam Dennis UXN:[id number] DOB: 20-Mar-1961 DOA: 01/21/2022     0 DOS: the patient was seen and examined on 01/22/2022   Brief hospital course: Adam Dennis was admitted to the hospital with the working diagnosis of acute respiratory failure.   61 yo male with the past medical history of ESRD on HD, COPD, heart failure, hypertension, T2DM and obesity class 3 who presented with acute hypoxemic respiratory failure. Patient was about to get a colonoscopy when his 02 saturation was noted to be 60 to 70%, he was transferred to the ED for further evaluation. On his initial physical examination his HR was 80, RR 18, BP 132/80, 02 saturation 92% on room air. Lungs with no wheezing or rhonchi, heart with S1 and S2 present and rhythmic, abdomen not distended and positive lower extremity edema.    ABG 7.36/ 57/ 75/ 32/ 94% Na 132, K 4,5 CL 92 bicarbonate 27 glucose 82 bun 35 cr 8,62  Wbc 5,8 hgb 12.4 plt 242  D dimer 0,67  Sars covid 19 negative   Chest radiograph with cardiomegaly with left hemidiaphragm elevation.  CT chest with no pulmonary emboli. Elevation of bilateral hemidiaphragms with volume loss at bases.   Follow up blood gas venous 7,26/ 70/ 40/ 31  Patient underwent hemodialysis with no complications.   Assessment and Plan: * Acute respiratory failure with hypoxia (HCC) Possible hypoventilation, OHS. Currently he is on 2 L/min per [place] with 02 saturation 96%.  Plan to continue oxymetry monitoring  Wean off supplemental 02 to room air, keep 02 saturation 92% or greater.  Continue with C pap at night.  Check VBG in am.  If C02 retention may have to upgrade his cpap to a bipap.   ESRD on dialysis ([place]) Hyponatremia.   Na 133, K 4,8 and serum bicarbonate 27.   Patient underwent HD with good toleration Follow up with HD as outpatient if he goes home tomorrow.  Follow up electrolytes in am   Diabetes mellitus type 2 in obese (HCC) Glucose is well  controlled, patient off insulin therapy.  Fasting glucose this am is 99  Essential hypertension, benign Continue blood pressure control with amlodipine  Hyperlipidemia Continue with statin therapy.   Class 3 obesity (HCC) Calculated BMI is 43.2   OSA continue with cpap.         Subjective: Patient with improvement in oxygenation, no chest pain or dyspnea.   Physical Exam: Vitals:   01/22/22 1300 01/22/22 1330 01/22/22 1400 01/22/22 1457  BP: (!) 144/84 129/77 125/79 122/71  Pulse: 100 82  84  Resp: (!) 21 (!) 25 (!) 31 19  Temp:    98.9 F (37.2 C)  TempSrc:      SpO2: 99% 97%  96%  Weight:    (!) 144.5 kg  Height:       Neurology awake and alert ENT with no pallor Cardiovascular with S1 and S2 present and rhythmic Respiratory with no rales or wheezing Abdomen with no distention  No lower extremity edema  Data Reviewed:    Family Communication: I spoke with patient's significant other at the bedside, we talked in detail about patient's condition, plan of care and prognosis and all questions were addressed.   Disposition: Status is: Inpatient Remains inpatient appropriate because: monitor 02 and C02  Planned Discharge Destination: Home     Author: Tawni Millers, MD 01/22/2022 5:09 PM  For on call review [email or web address].

## 2022-01-22 NOTE — Assessment & Plan Note (Signed)
Continue with statin therapy.  ?

## 2022-01-22 NOTE — Hospital Course (Signed)
Mr. Denning was admitted to the hospital with the working diagnosis of acute respiratory failure.   61 yo male with the past medical history of ESRD on HD, COPD, heart failure, hypertension, T2DM and obesity class 3 who presented with acute hypoxemic respiratory failure. Patient was about to get a colonoscopy when his 02 saturation was noted to be 60 to 70%, he was transferred to the ED for further evaluation. On his initial physical examination his HR was 80, RR 18, BP 132/80, 02 saturation 92% on room air. Lungs with no wheezing or rhonchi, heart with S1 and S2 present and rhythmic, abdomen not distended and positive lower extremity edema.    ABG 7.36/ 57/ 75/ 32/ 94% Na 132, K 4,5 CL 92 bicarbonate 27 glucose 82 bun 35 cr 8,62  Wbc 5,8 hgb 12.4 plt 242  D dimer 0,67  Sars covid 19 negative   Chest radiograph with cardiomegaly with left hemidiaphragm elevation.  CT chest with no pulmonary emboli. Elevation of bilateral hemidiaphragms with volume loss at bases.   Follow up blood gas venous 7,26/ 70/ 40/ 31  Patient underwent hemodialysis with no complications.

## 2022-01-22 NOTE — Assessment & Plan Note (Signed)
Calculated BMI is 43.2   OSA continue with cpap.

## 2022-01-22 NOTE — Assessment & Plan Note (Signed)
Glucose is well controlled, patient off insulin therapy.  Fasting glucose this am is 99

## 2022-01-23 DIAGNOSIS — J9601 Acute respiratory failure with hypoxia: Secondary | ICD-10-CM | POA: Diagnosis not present

## 2022-01-23 LAB — HEPATITIS B SURFACE ANTIBODY, QUANTITATIVE: Hep B S AB Quant (Post): 83.1 m[IU]/mL (ref >9.9)

## 2022-01-23 LAB — BASIC METABOLIC PANEL
Anion gap: 14 (ref 5–15)
BUN: 27 mg/dL — ABNORMAL HIGH (ref 6–20)
CO2: 29 mmol/L (ref 22–32)
Calcium: 9.3 mg/dL (ref 8.9–10.3)
Chloride: 93 mmol/L — ABNORMAL LOW (ref 98–111)
Creatinine, Ser: 8.64 mg/dL — ABNORMAL HIGH (ref 0.61–1.24)
GFR, Estimated: 6 mL/min — ABNORMAL LOW (ref >60)
Glucose, Bld: 90 mg/dL (ref 70–99)
Potassium: 4.7 mmol/L (ref 3.5–5.1)
Sodium: 136 mmol/L (ref 135–145)

## 2022-01-23 LAB — BLOOD GAS, VENOUS
Acid-Base Excess: 7 mmol/L — ABNORMAL HIGH (ref 0.0–2.0)
Bicarbonate: 34.8 mmol/L — ABNORMAL HIGH (ref 20.0–28.0)
O2 Saturation: 74.1 %
Patient temperature: 36.8
pCO2, Ven: 63 mmHg — ABNORMAL HIGH (ref 44–60)
pH, Ven: 7.35 (ref 7.25–7.43)
pO2, Ven: 44 mmHg (ref 32–45)

## 2022-01-23 LAB — GLUCOSE, CAPILLARY
Glucose-Capillary: 94 mg/dL (ref 70–99)
Glucose-Capillary: 103 mg/dL — ABNORMAL HIGH (ref 70–99)
Glucose-Capillary: 125 mg/dL — ABNORMAL HIGH (ref 70–99)

## 2022-01-23 MED ORDER — CHLORHEXIDINE GLUCONATE CLOTH 2 % EX PADS
6.0000 | MEDICATED_PAD | Freq: Every day | CUTANEOUS | Status: DC
Start: 2022-01-23 — End: 2022-01-26
  Administered 2022-01-23 – 2022-01-26 (×4): 6 via TOPICAL

## 2022-01-23 NOTE — Progress Notes (Signed)
A/O x 4. Compliant with medication and care. CPAP on with 5L O2 bleed in overnight. 1 desaturation into 70s. Upon assessment, mask off. Sats maintained 98-100 with mask on. CPAP off at 0430 per patient request. Oxygen titrated to 4L with saturations maintaining @ 100%. Weaned to 3L. Sats 95-100%. Pt resting quietly. No acute distress noted.

## 2022-01-23 NOTE — Progress Notes (Signed)
SATURATION QUALIFICATIONS: (This note is used to comply with regulatory documentation for home oxygen)  Patient Saturations on Room Air at Rest = 94%  Patient Saturations on Room Air while Ambulating = 85%  Patient Saturations on 4L Liters of oxygen while Ambulating = 92%

## 2022-01-23 NOTE — Progress Notes (Signed)
   01/23/22 1039  Mobility  Activity Ambulated independently in hallway  Level of Assistance Independent  Assistive Device None  Distance Ambulated (ft) 120 ft  Activity Response Tolerated well  $Mobility charge 1 Mobility   Mobility Specialist Progress Note  Pt was in chair and agreeable. Took X1 standing break d/t SOB but recovered by being coached through pursed lip breathing. Back in chair w/ all needs met & call bell in reach   Kyla Jones Mobility Specialist   

## 2022-01-23 NOTE — Progress Notes (Signed)
Pt receives out-pt HD at [facility] on TTS. Pt has a 6:10 am chair time. Will assist as needed.   Melven Sartorius Renal Navigator [phone]

## 2022-01-23 NOTE — Progress Notes (Signed)
[facility] Progress Note   Subjective:   Had HD yesterday with 3L UF. Reports breathing is a little better but still feels like he needs O2. Noted desat overnight when CPAP mask was off. He denies CP, dizziness, palpitations, nausea and edema.   Objective Vitals:   01/22/22 1900 01/22/22 2300 01/23/22 0300 01/23/22 0859  BP: (!) 106/44 (!) 109/42 (!) 109/52 124/68  Pulse:    89  Resp:    (!) 22  Temp: 98.1 F (36.7 C) 98.4 F (36.9 C) 97.7 F (36.5 C) 98.3 F (36.8 C)  TempSrc: Oral Oral Oral Oral  SpO2: 97%   98%  Weight:   (!) 145.7 kg   Height:   6' (1.829 m)    Physical Exam General: Alert male in NAD Heart: RRR, no murmurs, rubs or gallops Lungs: Respirations unlabored, Lungs CTA. O2 sat 100% on 2L Abdomen: Soft, non-distended, +BS Extremities: No edema b/l lower extremities Dialysis Access: RUE AVF + bruit  Additional Objective Labs: Basic Metabolic Panel: Recent Labs  Lab 01/21/22 0845 01/22/22 0530  NA 132* 133*  K 4.5 4.8  CL 91* 93*  CO2 27 27  GLUCOSE 82 99  BUN 35* 43*  CREATININE 8.62* 11.15*  CALCIUM 9.0 9.1   Liver Function Tests: Recent Labs  Lab 01/22/22 0530  AST 18  ALT 16  ALKPHOS 50  BILITOT 0.7  PROT 7.7  ALBUMIN 3.0*   No results for input(s): "LIPASE", "AMYLASE" in the last 168 hours. CBC: Recent Labs  Lab 01/21/22 0845 01/22/22 0530  WBC 5.8 5.6  NEUTROABS 4.1  --   HGB 12.4* 12.2*  HCT 40.9 39.7  MCV 90.3 88.0  PLT 242 263   Blood Culture    Component Value Date/Time   SDES BLOOD RIGHT HAND 06/30/2019 2000   SPECREQUEST  06/30/2019 2000    BOTTLES DRAWN AEROBIC ONLY Blood Culture results may not be optimal due to an inadequate volume of blood received in culture bottles   CULT  06/30/2019 2000    NO GROWTH 9 DAYS Performed at [facility], [place] [address], [place], [place] [address]    REPTSTATUS 07/09/2019 FINAL 06/30/2019 2000    Cardiac Enzymes: No results for input(s): "CKTOTAL",  "CKMB", "CKMBINDEX", "TROPONINI" in the last 168 hours. CBG: Recent Labs  Lab 01/21/22 1724 01/21/22 2035 01/22/22 2148 01/23/22 0659  GLUCAP 94 97 117* 94   Iron Studies: No results for input(s): "IRON", "TIBC", "TRANSFERRIN", "FERRITIN" in the last 72 hours. '@lablastinr3'$ @ Studies/Results: CT Angio Chest PE W and/or Wo Contrast  Result Date: 01/21/2022 CLINICAL DATA:  Pulmonary embolism suspected. Positive D-dimer. Poor oxygen saturation. EXAM: CT ANGIOGRAPHY CHEST WITH CONTRAST TECHNIQUE: Multidetector CT imaging of the chest was performed using the standard protocol during bolus administration of intravenous contrast. Multiplanar CT image reconstructions and MIPs were obtained to evaluate the vascular anatomy. RADIATION DOSE REDUCTION: This exam was performed according to the departmental dose-optimization program which includes automated exposure control, adjustment of the mA and/or kV according to patient size and/or use of iterative reconstruction technique. CONTRAST:  148m OMNIPAQUE IOHEXOL 350 MG/ML SOLN COMPARISON:  Chest radiography same day.  CT angiography 11/10/2021 FINDINGS: Cardiovascular: Heart size is normal. Minimal coronary artery calcification is seen. Minimal aortic atherosclerotic calcification is seen. Pulmonary arterial opacification is satisfactory. No pulmonary emboli. Prominent pulmonary arteries which could go along with chronic pulmonary arterial hypertension. Mediastinum/Nodes: No lymphadenopathy. 4 cm left thyroid nodule as seen previously and evaluated by ultrasound. See  results of thyroid ultrasound 11/11/2021. Lungs/Pleura: Chronically elevated hemidiaphragms with mild chronic volume loss at both lung bases. Upper Abdomen: Negative Musculoskeletal: Chronic spinal degenerative changes. Review of the MIP images confirms the above findings. IMPRESSION: No pulmonary emboli. Chronically elevated hemidiaphragms with chronic volume loss at the lung bases. Pulmonary arterial  prominence could go along with pulmonary arterial hypertension. Minimal coronary artery calcification and aortic atherosclerotic calcification. Electronically Signed   By: Nelson Chimes M.D.   On: 01/21/2022 11:24   Medications:   amLODipine  5 mg Oral Daily   aspirin EC  81 mg Oral Daily   atorvastatin  40 mg Oral Daily   Chlorhexidine Gluconate Cloth  6 each Topical Q0600   dorzolamide-timolol  1 drop Both Eyes BID   doxercalciferol  7 mcg Intravenous Q T,Th,Sa-HD   heparin  5,000 Units Subcutaneous Q8H   latanoprost  1 drop Both Eyes QHS   sevelamer carbonate  1,600 mg Oral TID WC    Outpatient Dialysis Orders: Center: [facility] TueThuSat, 4 hrs 15 min, Optiflux 200NRe, BFR 400, DFR Autoflow 1.5, EDW 146.5 (kg), Dialysate 2.0 K, 2.0 Ca, Heparin 10200 unit bolus Hectorol 4mg IV q HD  Assessment/Plan:  Hypoxia: possibly multifactorial, hx COPD and chronically elevated hemidiaphragms. May also be a bit volume overloaded from bowel prep. SOB improved after HD yesterday but still on O2  ESRD:  TTS schedule, next HD tomorrow  Hypertension/volume: See above, BP controlled, no volume overload on exam/imaging but is SOB. Will challenge EDW, see above  Anemia: Hb 12.2. No ESA indicated  Metabolic bone disease: Calcium controlled, will check phos. Continue hectorol and binders  Nutrition:  renal diet with fluid restrictions  SAnice Paganini PA-C 01/23/2022, 10:38 AM  [facility] Pager: [phone]

## 2022-01-23 NOTE — Plan of Care (Signed)

## 2022-01-23 NOTE — Progress Notes (Signed)
Pt states he only takes heparin on the days he goes to dialysis.

## 2022-01-23 NOTE — TOC Progression Note (Signed)
Transition of Care [facility]) - Progression Note    Patient Details  Name: TENG DECOU MRN: [id number] Date of Birth: [birth year]  Transition of Care [facility]) CM/SW Contact  Zenon Mayo, RN Phone Number: 01/23/2022, 9:10 PM  Clinical Narrative:    from home, acute resp failure, became sob while getting ready to have a colonscopy, he is HD patient TTHSAT, on 2 liters, ambulatory in room,  cret 11.2, bun 43. TOC following.        Expected Discharge Plan and Services                                                 Social Determinants of Health (SDOH) Interventions    Readmission Risk Interventions    07/07/2019   12:53 PM  Readmission Risk Prevention Plan  Transportation Screening Complete  PCP or Specialist Appt within 5-7 Days Complete  Home Care Screening Complete  Medication Review (RN CM) Complete

## 2022-01-23 NOTE — Progress Notes (Signed)
Attempted to wean patient off of 2 Liters of oxygen but was unsuccesssful. He does not wear oxygen at home but still needs at least 2 Litesr when resting.  With 2 Liters of oxygen, his O2 saturation is 91 to 94 percent.  He wears CPAP at night.

## 2022-01-23 NOTE — Progress Notes (Signed)
Pt refused FSBG check tonight prior to dinner. States he does not take insulin and his BG has been fine. Today results were 94 and 103.  Order is also present to check BG after HD.

## 2022-01-23 NOTE — Progress Notes (Signed)
Progress Note   Patient: Adam Dennis DOB: 1961/03/23 DOA: 01/21/2022     1 DOS: the patient was seen and examined on 01/23/2022   Brief hospital course: Adam Dennis was admitted to the hospital with the working diagnosis of acute respiratory failure.   61 yo male with the past medical history of ESRD on HD, COPD, heart failure, hypertension, T2DM and obesity class 3 who presented with acute hypoxemic respiratory failure. Patient was about to get a colonoscopy when his 02 saturation was noted to be 60 to 70%, he was transferred to the ED for further evaluation. On his initial physical examination his HR was 80, RR 18, BP 132/80, 02 saturation 92% on room air. Lungs with no wheezing or rhonchi, heart with S1 and S2 present and rhythmic, abdomen not distended and positive lower extremity edema.    ABG 7.36/ 57/ 75/ 32/ 94% Na 132, K 4,5 CL 92 bicarbonate 27 glucose 82 bun 35 cr 8,62  Wbc 5,8 hgb 12.4 plt 242  D dimer 0,67  Sars covid 19 negative   Chest radiograph with cardiomegaly with left hemidiaphragm elevation.  CT chest with no pulmonary emboli. Elevation of bilateral hemidiaphragms with volume loss at bases.   Follow up blood gas venous 7,26/ 70/ 40/ 31  Patient underwent hemodialysis with no complications.    8/30: Patient remained stable but desaturating.  Concern of hypoventilation syndrome secondary to morbid obesity. He was also ambulated and required up to 3 to 4 L of oxygen due to becoming hypoxic. Adam Dennis continues to exhibit signs of acute on chronic respiratory failure with hypoxia due to COPD. The use of the NIV will treat patient's elevated PCO2 (70 with an elevated Bicarb of 31.6 on 01/22/22) and can reduce risk of exacerbations and future hospitalizations when used at night and during the day. All alternate devices [phone] and [id number]) have been considered and ruled out as volume requirements are not met by BiLevel devices. An NIV with volume-targeted pressure  support is necessary to prevent patient from life-threatening harm. Interruption or failure to provide NIV would quickly lead to exacerbation of the patient's condition, hospital re-admission, and likely harm to the patient. Continued use is preferred. Patient is able to protect their airways and clear secretions on their own.  His nighttime CPAP was switched with BiPAP to see if that will help him better.  If remains stable he can be discharged with BiPAP in a day. DME oxygen ordered.  Assessment and Plan: * Acute respiratory failure with hypoxia (HCC) Possible hypoventilation, OHS. Currently he is on 2 L/min per [place] with 02 saturation 96%.  And his oxygen requirement went up to 4 L with ambulation. VBG with concern of CO2 retention, most likely hypoventilation syndrome with morbid obesity. -Switch nightly CPAP with BiPAP. -Continue with supplemental oxygen  ESRD on dialysis [facility]) Patient underwent HD with good toleration Follow up with HD as outpatient if he goes home tomorrow.  Follow up electrolytes in am  Diabetes mellitus type 2 in obese (HCC) Glucose is well controlled, patient off insulin therapy.  Fasting glucose this am is 99  Essential hypertension, benign Continue blood pressure control with amlodipine  Hyperlipidemia Continue with statin therapy.   Class 3 obesity (HCC) Calculated BMI is 43.2   OSA .  Switched CPAP with BiPAP due to hypercarbia.  Subjective: Patient was seen and examined today.  No new complaints.  Does not use oxygen at baseline.  We discussed about using oxygen and  getting BiPAP instead of CPAP.  Patient understand that he might need to go through another sleep study.  Physical Exam: Vitals:   01/22/22 2300 01/23/22 0300 01/23/22 0859 01/23/22 1300  BP: (!) 109/42 (!) 109/52 124/68 127/73  Pulse:   89 89  Resp:   (!) 22 (!) 24  Temp: 98.4 F (36.9 C) 97.7 F (36.5 C) 98.3 F (36.8 C) 98.6 F (37 C)  TempSrc: Oral Oral Oral Oral  SpO2:    98% 95%  Weight:  (!) 145.7 kg    Height:  6' (1.829 m)     General.  Morbidly obese gentleman, in no acute distress. Pulmonary.  Lungs clear bilaterally, normal respiratory effort. CV.  Regular rate and rhythm, no JVD, rub or murmur. Abdomen.  Soft, nontender, nondistended, BS positive. CNS.  Alert and oriented .  No focal neurologic deficit. Extremities.  No edema, no cyanosis, pulses intact and symmetrical. Psychiatry.  Judgment and insight appears normal.   Data Reviewed: Prior data reviewed  Family Communication: Discussed with patient  Disposition: Status is: Inpatient Remains inpatient appropriate because: monitor 02 and C02  Planned Discharge Destination: Home  DVT prophylaxis.  Heparin  This record has been created using Systems analyst. Errors have been sought and corrected,but may not always be located. Such creation errors do not reflect on the standard of care.   Author: Lorella Nimrod, MD 01/23/2022 3:16 PM  For on call review [email or web address].

## 2022-01-24 DIAGNOSIS — Z992 Dependence on renal dialysis: Secondary | ICD-10-CM | POA: Diagnosis not present

## 2022-01-24 DIAGNOSIS — J9601 Acute respiratory failure with hypoxia: Secondary | ICD-10-CM | POA: Diagnosis not present

## 2022-01-24 DIAGNOSIS — N186 End stage renal disease: Secondary | ICD-10-CM | POA: Diagnosis not present

## 2022-01-24 DIAGNOSIS — E1122 Type 2 diabetes mellitus with diabetic chronic kidney disease: Secondary | ICD-10-CM | POA: Diagnosis not present

## 2022-01-24 LAB — GLUCOSE, CAPILLARY
Glucose-Capillary: 98 mg/dL (ref 70–99)
Glucose-Capillary: 75 mg/dL (ref 70–99)
Glucose-Capillary: 94 mg/dL (ref 70–99)
Glucose-Capillary: 97 mg/dL (ref 70–99)

## 2022-01-24 LAB — BRAIN NATRIURETIC PEPTIDE: B Natriuretic Peptide: 142.6 pg/mL — ABNORMAL HIGH (ref 0.0–100.0)

## 2022-01-24 MED ORDER — HEPARIN SODIUM (PORCINE) 1000 UNIT/ML DIALYSIS
5000.0000 [IU] | Freq: Once | INTRAMUSCULAR | Status: DC
Start: 2022-01-24 — End: 2022-01-26

## 2022-01-24 MED ORDER — HEPARIN SODIUM (PORCINE) 1000 UNIT/ML IJ SOLN
INTRAMUSCULAR | Status: AC
  Filled 2022-01-24: qty 5

## 2022-01-24 NOTE — Plan of Care (Signed)

## 2022-01-24 NOTE — Progress Notes (Addendum)
PROGRESS NOTE  Adam Dennis JQZ:[id number] DOB: 1960/12/12 DOA: 01/21/2022 PCP: Charlott Rakes, MD  HPI/Recap of past 24 hours:  [name] was admitted to the hospital with the working diagnosis of acute respiratory failure.    61 yo male with the past medical history of ESRD on HD, COPD, heart failure, hypertension, T2DM and obesity class 3 who presented with acute hypoxemic respiratory failure. Patient was about to get a colonoscopy when his 02 saturation was noted to be 60 to 70%, he was transferred to the ED for further evaluation. On his initial physical examination his HR was 80, RR 18, BP 132/80, 02 saturation 92% on room air. Lungs with no wheezing or rhonchi, heart with S1 and S2 present and rhythmic, abdomen not distended and positive lower extremity edema.     ABG 7.36/ 57/ 75/ 32/ 94% Na 132, K 4,5 CL 92 bicarbonate 27 glucose 82 bun 35 cr 8,62  Wbc 5,8 hgb 12.4 plt 242  D dimer 0,67  Sars covid 19 negative    Chest radiograph with cardiomegaly with left hemidiaphragm elevation.  CT chest with no pulmonary emboli. Elevation of bilateral hemidiaphragms with volume loss at bases.    Follow up blood gas venous 7,26/ 70/ 40/ 31   Patient underwent hemodialysis with no complications on 11/15/61.     Mr. Aprea continues to exhibit signs of acute on chronic respiratory failure with hypoxia due to COPD. The use of the NIV will treat patient's elevated PCO2 (70 with an elevated Bicarb of 31.6 on 01/22/22) and can reduce risk of exacerbations and future hospitalizations when used at night and during the day. All alternate devices [phone] and [id number]) have been considered and ruled out as volume requirements are not met by BiLevel devices. An NIV with volume-targeted pressure support is necessary to prevent patient from life-threatening harm. Interruption or failure to provide NIV would quickly lead to exacerbation of the patient's condition, hospital re-admission, and likely harm to the  patient. Continued use is preferred. Patient is able to protect their airways and clear secretions on their own.   His nighttime CPAP was switched with BiPAP to see if that will help him better.  If remains stable he can be discharged with BiPAP in a day. DME oxygen ordered.  01/24/2022: The patient was seen and examined at his bedside.  Feels his dyspnea is improving.  Endorses he has not cramping and dizziness for hemodialysis today.  UF was stopped and he is being closely monitored.  Assessment/Plan: Principal Problem:   Acute respiratory failure with hypoxia (HCC) Active Problems:   ESRD on dialysis ([place])   Diabetes mellitus type 2 in obese [facility])   Essential hypertension, benign   Glaucoma of right eye   Hyperlipidemia   Class 3 obesity (HCC)   Acute on chronic respiratory failure with hypoxia and hypercarbia, secondary to COPD exacerbation Currently he is on 3 L/min per [place] with 02 saturation 96%.  And his oxygen requirement went up to 4 L with ambulation. VBG with concern of CO2 retention, most likely COPD exacerbation-Switch nightly CPAP with BiPAP. -Continue with supplemental oxygen -Would benefit from NIV Trilegy.   ESRD on dialysis [facility]) Patient underwent HD with good toleration Follow up with HD as outpatient if he goes home tomorrow.  Follow up electrolytes in am   Diabetes mellitus type 2 in obese (HCC) Glucose is well controlled, patient off insulin therapy.  Fasting glucose this am is 99   Essential hypertension, benign Continue  blood pressure control with amlodipine   Hyperlipidemia Continue with statin therapy.    Class 3 obesity (HCC) Calculated BMI is 43.2    OSA .  Switched CPAP with BiPAP due to hypercarbia.    Code Status: Full code  Family Communication: None at bedside  Disposition Plan: Anticipate discharge to home within the next 24-48 hours.   Consultants: Nephrology  Procedures: Hemodialysis  Antimicrobials: None.  DVT  prophylaxis: Subcu heparin 3 times daily  Status is: Inpatient The patient requires at least 2 midnights for further evaluation and treatment.  Condition.    Objective: Vitals:   01/24/22 0135 01/24/22 0205 01/24/22 0600 01/24/22 0741  BP:   131/82 (!) 135/96  Pulse:   74 81  Resp:   18 18  Temp:   97.6 F (36.4 C) 97.9 F (36.6 C)  TempSrc:   Axillary Oral  SpO2: 93% 94% 98% 95%  Weight:      Height:        Intake/Output Summary (Last 24 hours) at 01/24/2022 0806 Last data filed at 01/23/2022 2115 Gross per 24 hour  Intake 240 ml  Output 0 ml  Net 240 ml   Filed Weights   01/22/22 1022 01/22/22 1457 01/23/22 0300  Weight: (!) 147.7 kg (!) 144.5 kg (!) 145.7 kg    Exam:  General: 61 y.o. year-old male well developed well nourished in no acute distress.  Alert and oriented x3. Cardiovascular: Regular rate and rhythm with no rubs or gallops.  No thyromegaly or JVD noted.   Respiratory: Clear to auscultation with no wheezes or rales. Good inspiratory effort. Abdomen: Soft nontender nondistended with normal bowel sounds x4 quadrants. Musculoskeletal: No lower extremity edema. 2/4 pulses in all 4 extremities. Skin: No ulcerative lesions noted or rashes, Psychiatry: Mood is appropriate for condition and setting   Data Reviewed: CBC: Recent Labs  Lab 01/21/22 0845 01/22/22 0530  WBC 5.8 5.6  NEUTROABS 4.1  --   HGB 12.4* 12.2*  HCT 40.9 39.7  MCV 90.3 88.0  PLT 242 299   Basic Metabolic Panel: Recent Labs  Lab 01/21/22 0845 01/22/22 0530 01/23/22 1134  NA 132* 133* 136  K 4.5 4.8 4.7  CL 91* 93* 93*  CO2 27 27 29   GLUCOSE 82 99 90  BUN 35* 43* 27*  CREATININE 8.62* 11.15* 8.64*  CALCIUM 9.0 9.1 9.3   GFR: Estimated Creatinine Clearance: 13.5 mL/min (A) (by C-G formula based on SCr of 8.64 mg/dL (H)). Liver Function Tests: Recent Labs  Lab 01/22/22 0530  AST 18  ALT 16  ALKPHOS 50  BILITOT 0.7  PROT 7.7  ALBUMIN 3.0*   No results for  input(s): "LIPASE", "AMYLASE" in the last 168 hours. No results for input(s): "AMMONIA" in the last 168 hours. Coagulation Profile: No results for input(s): "INR", "PROTIME" in the last 168 hours. Cardiac Enzymes: No results for input(s): "CKTOTAL", "CKMB", "CKMBINDEX", "TROPONINI" in the last 168 hours. BNP (last 3 results) No results for input(s): "PROBNP" in the last 8760 hours. HbA1C: Recent Labs    01/21/22 1353  HGBA1C 6.0*   CBG: Recent Labs  Lab 01/22/22 2148 01/23/22 0659 01/23/22 1116 01/23/22 2112 01/24/22 0610  GLUCAP 117* 94 103* 125* 98   Lipid Profile: No results for input(s): "CHOL", "HDL", "LDLCALC", "TRIG", "CHOLHDL", "LDLDIRECT" in the last 72 hours. Thyroid Function Tests: No results for input(s): "TSH", "T4TOTAL", "FREET4", "T3FREE", "THYROIDAB" in the last 72 hours. Anemia Panel: No results for input(s): "VITAMINB12", "FOLATE", "FERRITIN", "TIBC", "  IRON", "RETICCTPCT" in the last 72 hours. Urine analysis:    Component Value Date/Time   COLORURINE AMBER (A) 07/01/2019 0044   APPEARANCEUR CLOUDY (A) 07/01/2019 0044   LABSPEC 1.015 07/01/2019 0044   PHURINE 5.0 07/01/2019 0044   GLUCOSEU NEGATIVE 07/01/2019 0044   HGBUR NEGATIVE 07/01/2019 0044   BILIRUBINUR NEGATIVE 07/01/2019 0044   BILIRUBINUR negative 12/16/2016 1019   KETONESUR NEGATIVE 07/01/2019 0044   PROTEINUR >=300 (A) 07/01/2019 0044   UROBILINOGEN 1.0 12/16/2016 1019   UROBILINOGEN 1.0 10/09/2014 0900   NITRITE NEGATIVE 07/01/2019 0044   LEUKOCYTESUR MODERATE (A) 07/01/2019 0044   Sepsis Labs: @LABRCNTIP (procalcitonin:4,lacticidven:4)  ) Recent Results (from the past 240 hour(s))  SARS Coronavirus 2 by RT PCR (hospital order, performed in [place] hospital lab) *cepheid single result test* Anterior Nasal Swab     Status: None   Collection Time: 01/21/22  9:10 AM   Specimen: Anterior Nasal Swab  Result Value Ref Range Status   SARS Coronavirus 2 by RT PCR NEGATIVE NEGATIVE  Final    Comment: (NOTE) SARS-CoV-2 target nucleic acids are NOT DETECTED.  The SARS-CoV-2 RNA is generally detectable in upper and lower respiratory specimens during the acute phase of infection. The lowest concentration of SARS-CoV-2 viral copies this assay can detect is 250 copies / mL. A negative result does not preclude SARS-CoV-2 infection and should not be used as the sole basis for treatment or other patient management decisions.  A negative result may occur with improper specimen collection / handling, submission of specimen other than nasopharyngeal swab, presence of viral mutation(s) within the areas targeted by this assay, and inadequate number of viral copies (<250 copies / mL). A negative result must be combined with clinical observations, patient history, and epidemiological information.  Fact Sheet for Patients:   https://www.patel.info/  Fact Sheet for Healthcare Providers: https://Deonta Bomberger.com/  This test is not yet approved or  cleared by the [place] FDA and has been authorized for detection and/or diagnosis of SARS-CoV-2 by FDA under an Emergency Use Authorization (EUA).  This EUA will remain in effect (meaning this test can be used) for the duration of the COVID-19 declaration under Section 564(b)(1) of the Act, 21 U.S.C. section 360bbb-3(b)(1), unless the authorization is terminated or revoked sooner.  Performed at [facility], [place] [address] [place], [place] [address]       Studies: No results found.  Scheduled Meds:  amLODipine  5 mg Oral Daily   aspirin EC  81 mg Oral Daily   atorvastatin  40 mg Oral Daily   Chlorhexidine Gluconate Cloth  6 each Topical Q0600   dorzolamide-timolol  1 drop Both Eyes BID   doxercalciferol  7 mcg Intravenous Q T,Th,Sa-HD   heparin  5,000 Units Subcutaneous Q8H   heparin sodium (porcine)       heparin sodium (porcine)  5,000 Units Intravenous Once    latanoprost  1 drop Both Eyes QHS   sevelamer carbonate  1,600 mg Oral TID WC    Continuous Infusions:   LOS: 2 days     Kayleen Memos, MD [facility] Pager [phone]  If 7PM-7AM, please contact night-coverage [email or web address] Password [email or web address] 01/24/2022, 8:06 AM

## 2022-01-24 NOTE — Progress Notes (Signed)
Patient continuing to have episodes of desaturation with CPAP on. Went and placed patient on full face mask instead of nasal mask (as his mouth was open and most of the pressure was escaping through his mouth) also adjusted his EPAP to automode 10-17cm H2O. Patient is also now on a 6 lpm bleed in with O2.

## 2022-01-24 NOTE — Progress Notes (Signed)
Patient again with desaturation into mid to high 70s. RT placed patient on autoBIPAP mode with a min pressure of 12.6, max pressure of 20, and pressure support of 5. O2 sats came up with this adjustment.

## 2022-01-24 NOTE — Procedures (Signed)
HD note  At 1200, Patient started complaining of cramping and dizziness.  UF was stopped and his head was lowered. The complaints were resolved within about 30 from the UF pull being stopped. Pt currently without any discomfort

## 2022-01-24 NOTE — Progress Notes (Signed)
Adam Dennis Progress Note   Outpatient Dialysis Orders: Center: [facility] TueThuSat, 4 hrs 15 min, Optiflux 200NRe, BFR 400, DFR Autoflow 1.5, EDW 146.5 (kg), Dialysate 2.0 K, 2.0 Ca, Heparin 10200 unit bolus Hectorol 38mg IV q HD  Assessment/Plan:  Hypoxia: possibly multifactorial, hx COPD and chronically elevated hemidiaphragms. May also be a bit volume overloaded from bowel prep. SOB improved after HD yesterday but still on O2  ESRD:  TTS schedule Seen on HD  119/76 3L goal Net UF 2K bath rt AVF w/ needles in place He's going to be below his EDW after tx today if he tolerates; will need to lower EDW at d/c if this is the case.   Hypertension/volume: See above, BP controlled, no volume overload on exam/imaging but is SOB. Will challenge EDW, see above  Anemia: Hb 12.2. No ESA indicated  Metabolic bone disease: Calcium controlled, still need  to  check phos (will do so tomorrow). Continue hectorol and binders  Nutrition:  renal diet with fluid restrictions  Subjective:   Seen on HD and tolerating; reports breathing is a little better but still feels like he needs O2. Currently still on [place] O2. Desat overnight when CPAP mask was off. He denies CP, dizziness, palpitations, nausea and edema.   Objective Vitals:   01/24/22 0600 01/24/22 0741 01/24/22 0835 01/24/22 0855  BP: 131/82 (!) 135/96 119/74 119/76  Pulse: 74 81 79 74  Resp: 18 18 (!) 30 (!) 23  Temp: 97.6 F (36.4 C) 97.9 F (36.6 C) 98 F (36.7 C)   TempSrc: Axillary Oral Oral   SpO2: 98% 95% 94% 95%  Weight:      Height:       Physical Exam General: Alert male in NAD Heart: RRR, no murmurs, rubs or gallops Lungs: Respirations unlabored, Lungs CTA. O2 sat 100% on 2L Abdomen: Soft, non-distended, +BS Extremities: No edema b/l lower extremities Dialysis Access: RUE AVF + bruit  Additional Objective Labs: Basic Metabolic Panel: Recent Labs  Lab 01/21/22 0845 01/22/22 0530  01/23/22 1134  NA 132* 133* 136  K 4.5 4.8 4.7  CL 91* 93* 93*  CO2 '27 27 29  '$ GLUCOSE 82 99 90  BUN 35* 43* 27*  CREATININE 8.62* 11.15* 8.64*  CALCIUM 9.0 9.1 9.3   Liver Function Tests: Recent Labs  Lab 01/22/22 0530  AST 18  ALT 16  ALKPHOS 50  BILITOT 0.7  PROT 7.7  ALBUMIN 3.0*   No results for input(s): "LIPASE", "AMYLASE" in the last 168 hours. CBC: Recent Labs  Lab 01/21/22 0845 01/22/22 0530  WBC 5.8 5.6  NEUTROABS 4.1  --   HGB 12.4* 12.2*  HCT 40.9 39.7  MCV 90.3 88.0  PLT 242 263   Blood Culture    Component Value Date/Time   SDES BLOOD RIGHT HAND 06/30/2019 2000   SPECREQUEST  06/30/2019 2000    BOTTLES DRAWN AEROBIC ONLY Blood Culture results may not be optimal due to an inadequate volume of blood received in culture bottles   CULT  06/30/2019 2000    NO GROWTH 9 DAYS Performed at [facility] [id number] [place] [address]   REPTSTATUS 07/09/2019 FINAL 06/30/2019 2000    Cardiac Enzymes: No results for input(s): "CKTOTAL", "CKMB", "CKMBINDEX", "TROPONINI" in the last 168 hours. CBG: Recent Labs  Lab 01/22/22 2148 01/23/22 0659 01/23/22 1116 01/23/22 2112 01/24/22 0610  GLUCAP 117* 94 103* 125* 98   Iron Studies: No results  for input(s): "IRON", "TIBC", "TRANSFERRIN", "FERRITIN" in the last 72 hours. '@lablastinr3'$ @ Studies/Results: No results found. Medications:   amLODipine  5 mg Oral Daily   aspirin EC  81 mg Oral Daily   atorvastatin  40 mg Oral Daily   Chlorhexidine Gluconate Cloth  6 each Topical Q0600   dorzolamide-timolol  1 drop Both Eyes BID   doxercalciferol  7 mcg Intravenous Q T,Th,Sa-HD   heparin  5,000 Units Subcutaneous Q8H   heparin  5,000 Units Dialysis Once   heparin sodium (porcine)       latanoprost  1 drop Both Eyes QHS   sevelamer carbonate  1,600 mg Oral TID WC

## 2022-01-24 NOTE — Plan of Care (Signed)
  Problem: Education: Goal: Knowledge of General Education information will improve Description: Including pain rating scale, medication(s)/side effects and non-pharmacologic comfort measures Outcome: Progressing   Problem: Clinical Measurements: Goal: Ability to maintain clinical measurements within normal limits will improve Outcome: Progressing Goal: Will remain free from infection Outcome: Progressing   

## 2022-01-25 DIAGNOSIS — J9601 Acute respiratory failure with hypoxia: Secondary | ICD-10-CM | POA: Diagnosis not present

## 2022-01-25 LAB — CBC
HCT: 41.5 % (ref 39.0–52.0)
Hemoglobin: 12.7 g/dL — ABNORMAL LOW (ref 13.0–17.0)
MCH: 26.7 pg (ref 26.0–34.0)
MCHC: 30.6 g/dL (ref 30.0–36.0)
MCV: 87.2 fL (ref 80.0–100.0)
Platelets: 262 10*3/uL (ref 150–400)
RBC: 4.76 MIL/uL (ref 4.22–5.81)
RDW: 17.5 % — ABNORMAL HIGH (ref 11.5–15.5)
WBC: 5.7 10*3/uL (ref 4.0–10.5)
nRBC: 0 % (ref 0.0–0.2)

## 2022-01-25 LAB — GLUCOSE, CAPILLARY
Glucose-Capillary: 90 mg/dL (ref 70–99)
Glucose-Capillary: 111 mg/dL — ABNORMAL HIGH (ref 70–99)
Glucose-Capillary: 98 mg/dL (ref 70–99)
Glucose-Capillary: 95 mg/dL (ref 70–99)

## 2022-01-25 LAB — RENAL FUNCTION PANEL
Albumin: 3.1 g/dL — ABNORMAL LOW (ref 3.5–5.0)
Anion gap: 10 (ref 5–15)
BUN: 28 mg/dL — ABNORMAL HIGH (ref 6–20)
CO2: 31 mmol/L (ref 22–32)
Calcium: 9.5 mg/dL (ref 8.9–10.3)
Chloride: 94 mmol/L — ABNORMAL LOW (ref 98–111)
Creatinine, Ser: 7.54 mg/dL — ABNORMAL HIGH (ref 0.61–1.24)
GFR, Estimated: 8 mL/min — ABNORMAL LOW (ref >60)
Glucose, Bld: 96 mg/dL (ref 70–99)
Phosphorus: 6.6 mg/dL — ABNORMAL HIGH (ref 2.5–4.6)
Potassium: 4.5 mmol/L (ref 3.5–5.1)
Sodium: 135 mmol/L (ref 135–145)

## 2022-01-25 MED ORDER — SEVELAMER CARBONATE 800 MG PO TABS
2400.0000 mg | ORAL_TABLET | Freq: Three times a day (TID) | ORAL | Status: DC
  Administered 2022-01-25 – 2022-01-26 (×3): 2400 mg via ORAL
  Filled 2022-01-25 (×3): qty 3

## 2022-01-25 NOTE — TOC Progression Note (Addendum)
Transition of Care [facility]) - Progression Note    Patient Details  Name: Adam Dennis MRN: [id number] Date of Birth: 1960/09/01  Transition of Care [facility]) CM/SW Contact  Zenon Mayo, RN Phone Number: 01/25/2022, 1:58 PM  Clinical Narrative:    Patient was approved for the NIV with [employer], Ryan the respiratory therapist with [employer] will be here between 2 and 3 to set up the NIV in the patient's room. Patient states his wife will be off work at 3 pm and she will be on her way to the hospital also. NCM informed MD and Staff RN of this information.         Expected Discharge Plan and Services                                                 Social Determinants of Health (SDOH) Interventions    Readmission Risk Interventions    07/07/2019   12:53 PM  Readmission Risk Prevention Plan  Transportation Screening Complete  PCP or Specialist Appt within 5-7 Days Complete  Home Care Screening Complete  Medication Review (RN CM) Complete

## 2022-01-25 NOTE — Progress Notes (Signed)
[facility] Progress Note   Outpatient Dialysis Orders: Center: [facility] TueThuSat, 4 hrs 15 min, Optiflux 200NRe, BFR 400, DFR Autoflow 1.5, EDW 146.5 (kg), Dialysate 2.0 K, 2.0 Ca, Heparin 10200 unit bolus Hectorol 39mg IV q HD  Assessment/Plan:  Hypoxia: possibly multifactorial, hx COPD and chronically elevated hemidiaphragms. May also be a bit volume overloaded from bowel prep. SOB improved after HD yesterday but still on O2  ESRD:  TTS schedule 1.9L net UF 8/31  No absolute indication for RRT and the patient appears to be  comfortable. Plan for next HD treatment tomorrow; he is at his EDW (new EDW is 145kg)    Hypertension/volume: See above, BP controlled, no volume overload on exam/imaging but is SOB. Will challenge EDW, see above  Anemia: Hb 12.2. No ESA indicated  Metabolic bone disease: Calcium controlled, phos 6.6.  - Continue hectorol and binders; incr renvela to 3 TIDM  Nutrition:  renal diet with fluid restrictions  Subjective:   Seen in room and his breathing is better but still feels like he needs O2.  Desat overnight when CPAP mask was off. He denies CP, dizziness, palpitations, nausea and edema. He did have cramping on hd on 8/31.  Objective Vitals:   01/25/22 0000 01/25/22 0514 01/25/22 0536 01/25/22 0724  BP: 131/86  (!) 141/86 124/79  Pulse:  70 71 81  Resp: '18  19 18  '$ Temp: 98.4 F (36.9 C)  97.6 F (36.4 C) 97.6 F (36.4 C)  TempSrc: Oral  Oral Oral  SpO2:  100% 100% 100%  Weight:   (!) 145.4 kg   Height:       Physical Exam General: Alert male in NAD Heart: RRR, no murmurs, rubs or gallops Lungs: Respirations unlabored, Lungs CTA. O2 sat 100% on 2L Abdomen: Soft, non-distended, +BS Extremities: No edema b/l lower extremities Dialysis Access: RUE AVF + bruit  Additional Objective Labs: Basic Metabolic Panel: Recent Labs  Lab 01/22/22 0530 01/23/22 1134 01/25/22 0627  NA 133* 136 135  K 4.8 4.7 4.5   CL 93* 93* 94*  CO2 '27 29 31  '$ GLUCOSE 99 90 96  BUN 43* 27* 28*  CREATININE 11.15* 8.64* 7.54*  CALCIUM 9.1 9.3 9.5  PHOS  --   --  6.6*   Liver Function Tests: Recent Labs  Lab 01/22/22 0530 01/25/22 0627  AST 18  --   ALT 16  --   ALKPHOS 50  --   BILITOT 0.7  --   PROT 7.7  --   ALBUMIN 3.0* 3.1*   No results for input(s): "LIPASE", "AMYLASE" in the last 168 hours. CBC: Recent Labs  Lab 01/21/22 0845 01/22/22 0530 01/25/22 0627  WBC 5.8 5.6 5.7  NEUTROABS 4.1  --   --   HGB 12.4* 12.2* 12.7*  HCT 40.9 39.7 41.5  MCV 90.3 88.0 87.2  PLT 242 263 262   Blood Culture    Component Value Date/Time   SDES BLOOD RIGHT HAND 06/30/2019 2000   SPECREQUEST  06/30/2019 2000    BOTTLES DRAWN AEROBIC ONLY Blood Culture results may not be optimal due to an inadequate volume of blood received in culture bottles   CULT  06/30/2019 2000    NO GROWTH 9 DAYS Performed at [facility] [id number], [place] [address]   REPTSTATUS 07/09/2019 FINAL 06/30/2019 2000    Cardiac Enzymes: No results for input(s): "CKTOTAL", "CKMB", "CKMBINDEX", "TROPONINI" in the last 168 hours. CBG:  Recent Labs  Lab 01/24/22 1501 01/24/22 1622 01/24/22 2057 01/25/22 0555 01/25/22 1112  GLUCAP 75 94 97 90 111*   Iron Studies: No results for input(s): "IRON", "TIBC", "TRANSFERRIN", "FERRITIN" in the last 72 hours. '@lablastinr3'$ @ Studies/Results: No results found. Medications:   amLODipine  5 mg Oral Daily   aspirin EC  81 mg Oral Daily   atorvastatin  40 mg Oral Daily   Chlorhexidine Gluconate Cloth  6 each Topical Q0600   dorzolamide-timolol  1 drop Both Eyes BID   doxercalciferol  7 mcg Intravenous Q T,Th,Sa-HD   heparin  5,000 Units Subcutaneous Q8H   heparin  5,000 Units Dialysis Once   latanoprost  1 drop Both Eyes QHS   sevelamer carbonate  1,600 mg Oral TID WC

## 2022-01-25 NOTE — Progress Notes (Signed)
Mobility Specialist Progress Note:   01/25/22 0919  Mobility  Activity Ambulated with assistance in hallway  Level of Assistance Independent  Assistive Device None  Distance Ambulated (ft) 200 ft  Activity Response Tolerated well  $Mobility charge 1 Mobility   Pt received EOB willing to participate in mobility. No complaints of pain. Left EOB with call bell in reach and all needs met.   [facility] Korena Nass Mobility Specialist

## 2022-01-25 NOTE — Progress Notes (Signed)
PROGRESS NOTE  [name] NLG:[id number] DOB: 09-24-1960 DOA: 01/21/2022 PCP: Charlott Rakes, MD  HPI/Recap of past 24 hours:  [name] was admitted to the hospital with the working diagnosis of acute respiratory failure.    61 yo male with the past medical history of ESRD on HD, COPD, heart failure, hypertension, T2DM and obesity class 3 who presented with acute hypoxemic respiratory failure. Patient was about to get a colonoscopy when his 02 saturation was noted to be 60 to 70%, he was transferred to the ED for further evaluation. On his initial physical examination his HR was 80, RR 18, BP 132/80, 02 saturation 92% on room air. Lungs with no wheezing or rhonchi, heart with S1 and S2 present and rhythmic, abdomen not distended and positive lower extremity edema.     ABG 7.36/ 57/ 75/ 32/ 94% Na 132, K 4,5 CL 92 bicarbonate 27 glucose 82 bun 35 cr 8,62  Wbc 5,8 hgb 12.4 plt 242  D dimer 0,67  Sars covid 19 negative    Chest radiograph with cardiomegaly with left hemidiaphragm elevation.  CT chest with no pulmonary emboli. Elevation of bilateral hemidiaphragms with volume loss at bases.    Follow up blood gas venous 7,26/ 70/ 40/ 31   Patient underwent hemodialysis with no complications on 0/81/44.     Mr. Kingsley continues to exhibit signs of acute on chronic respiratory failure with hypoxia due to COPD. The use of the NIV will treat patient's elevated PCO2 (70 with an elevated Bicarb of 31.6 on 01/22/22) and can reduce risk of exacerbations and future hospitalizations when used at night and during the day. All alternate devices [phone] and [id number]) have been considered and ruled out as volume requirements are not met by BiLevel devices. An NIV with volume-targeted pressure support is necessary to prevent patient from life-threatening harm. Interruption or failure to provide NIV would quickly lead to exacerbation of the patient's condition, hospital re-admission, and likely harm to the  patient. Continued use is preferred. Patient is able to protect their airways and clear secretions on their own.   His nighttime CPAP was switched with BiPAP to see if that will help him better.  If remains stable he can be discharged with BiPAP in a day. DME oxygen ordered.  01/24/2022: The patient was seen and examined at his bedside.  Feels his dyspnea is improving.  Endorses he has not cramping and dizziness for hemodialysis today.  UF was stopped and he is being closely monitored.  01/25/2022: Patient seen.  Patient is awaiting NIV trilogy.  No new changes.  Assessment/Plan: Principal Problem:   Acute respiratory failure with hypoxia (HCC) Active Problems:   ESRD on dialysis ([place])   Diabetes mellitus type 2 in obese [facility])   Essential hypertension, benign   Glaucoma of right eye   Hyperlipidemia   Class 3 obesity ([place])   Acute respiratory failure with hypoxia and hypercarbia, secondary to COPD exacerbation Currently he is on 3 L/min per [place] with 02 saturation 96%.  And his oxygen requirement went up to 4 L with ambulation. VBG with concern of CO2 retention, most likely COPD exacerbation-Switch nightly CPAP with BiPAP. -Continue with supplemental oxygen -Would benefit from NIV Trilegy. -TOC input is appreciated.   ESRD on dialysis [facility]) -Nephrology team is managing. -Likely hemodialysis in the morning..   Diabetes mellitus type 2 in obese (HCC) Glucose is well controlled, patient off insulin therapy.  Fasting glucose this am is 99   Essential hypertension,  benign Continue blood pressure control with amlodipine   Hyperlipidemia Continue with statin therapy.    Class 3 obesity (HCC) Calculated BMI is 43.2    OSA .  Switched CPAP to BiPAP due to hypercarbia.    Code Status: Full code  Family Communication: None at bedside  Disposition Plan: Anticipate discharge to home on Monday  123.   Consultants: Nephrology  Procedures: Hemodialysis  Antimicrobials: None.  DVT prophylaxis: Subcu heparin 3 times daily  Status is: Inpatient The patient requires at least 2 midnights for further evaluation and treatment.  Condition.    Objective: Vitals:   01/24/22 2329 01/25/22 0000 01/25/22 0514 01/25/22 0536  BP:  131/86  (!) 141/86  Pulse: 82  70 71  Resp:  18  19  Temp:  98.4 F (36.9 C)  97.6 F (36.4 C)  TempSrc:  Oral  Oral  SpO2: 99%  100% 100%  Weight:    (!) 145.4 kg  Height:        Intake/Output Summary (Last 24 hours) at 01/25/2022 0713 Last data filed at 01/24/2022 1731 Gross per 24 hour  Intake 260 ml  Output 1900 ml  Net -1640 ml    Filed Weights   01/22/22 1457 01/23/22 0300 01/25/22 0536  Weight: (!) 144.5 kg (!) 145.7 kg (!) 145.4 kg    Exam:  General: Patient is morbidly obese.  Patient is not in any distress. Cardiovascular: S1-S2.     Respiratory: Decreased air entry Abdomen: Obese, soft and nontender.   Neuro: Awake and alert.  Moves all extremities. Extremities: Fullness of the ankle.      Data Reviewed: CBC: Recent Labs  Lab 01/21/22 0845 01/22/22 0530 01/25/22 0627  WBC 5.8 5.6 5.7  NEUTROABS 4.1  --   --   HGB 12.4* 12.2* 12.7*  HCT 40.9 39.7 41.5  MCV 90.3 88.0 87.2  PLT 242 263 219    Basic Metabolic Panel: Recent Labs  Lab 01/21/22 0845 01/22/22 0530 01/23/22 1134  NA 132* 133* 136  K 4.5 4.8 4.7  CL 91* 93* 93*  CO2 _0 GLUCOSE 82 99 90  BUN 35* 43* 27*  CREATININE 8.62* 11.15* 8.64*  CALCIUM 9.0 9.1 9.3    GFR: Estimated Creatinine Clearance: 13.5 mL/min (A) (by C-G formula based on SCr of 8.64 mg/dL (H)). Liver Function Tests: Recent Labs  Lab 01/22/22 0530  AST 18  ALT 16  ALKPHOS 50  BILITOT 0.7  PROT 7.7  ALBUMIN 3.0*    No results for input(s): "LIPASE", "AMYLASE" in the last 168 hours. No results for input(s): "AMMONIA" in the last 168 hours. Coagulation  Profile: No results for input(s): "INR", "PROTIME" in the last 168 hours. Cardiac Enzymes: No results for input(s): "CKTOTAL", "CKMB", "CKMBINDEX", "TROPONINI" in the last 168 hours. BNP (last 3 results) No results for input(s): "PROBNP" in the last 8760 hours. HbA1C: No results for input(s): "HGBA1C" in the last 72 hours.  CBG: Recent Labs  Lab 01/24/22 0610 01/24/22 1501 01/24/22 1622 01/24/22 2057 01/25/22 0555  GLUCAP 98 75 94 97 90    Lipid Profile: No results for input(s): "CHOL", "HDL", "LDLCALC", "TRIG", "CHOLHDL", "LDLDIRECT" in the last 72 hours. Thyroid Function Tests: No results for input(s): "TSH", "T4TOTAL", "FREET4", "T3FREE", "THYROIDAB" in the last 72 hours. Anemia Panel: No results for input(s): "VITAMINB12", "FOLATE", "FERRITIN", "TIBC", "IRON", "RETICCTPCT" in the last 72 hours. Urine analysis:    Component Value Date/Time   COLORURINE AMBER (A) 07/01/2019 7588  APPEARANCEUR CLOUDY (A) 07/01/2019 0044   LABSPEC 1.015 07/01/2019 0044   PHURINE 5.0 07/01/2019 0044   GLUCOSEU NEGATIVE 07/01/2019 0044   HGBUR NEGATIVE 07/01/2019 0044   BILIRUBINUR NEGATIVE 07/01/2019 0044   BILIRUBINUR negative 12/16/2016 1019   KETONESUR NEGATIVE 07/01/2019 0044   PROTEINUR >=300 (A) 07/01/2019 0044   UROBILINOGEN 1.0 12/16/2016 1019   UROBILINOGEN 1.0 10/09/2014 0900   NITRITE NEGATIVE 07/01/2019 0044   LEUKOCYTESUR MODERATE (A) 07/01/2019 0044   Sepsis Labs: _0 (procalcitonin:4,lacticidven:4)  ) Recent Results (from the past 240 hour(s))  SARS Coronavirus 2 by RT PCR (hospital order, performed in [place] hospital lab) *cepheid single result test* Anterior Nasal Swab     Status: None   Collection Time: 01/21/22  9:10 AM   Specimen: Anterior Nasal Swab  Result Value Ref Range Status   SARS Coronavirus 2 by RT PCR NEGATIVE NEGATIVE Final    Comment: (NOTE) SARS-CoV-2 target nucleic acids are NOT DETECTED.  The SARS-CoV-2 RNA is generally detectable  in upper and lower respiratory specimens during the acute phase of infection. The lowest concentration of SARS-CoV-2 viral copies this assay can detect is 250 copies / mL. A negative result does not preclude SARS-CoV-2 infection and should not be used as the sole basis for treatment or other patient management decisions.  A negative result may occur with improper specimen collection / handling, submission of specimen other than nasopharyngeal swab, presence of viral mutation(s) within the areas targeted by this assay, and inadequate number of viral copies (<250 copies / mL). A negative result must be combined with clinical observations, patient history, and epidemiological information.  Fact Sheet for Patients:   https://www.patel.info/  Fact Sheet for Healthcare Providers: https://hall.com/  This test is not yet approved or  cleared by the [place] FDA and has been authorized for detection and/or diagnosis of SARS-CoV-2 by FDA under an Emergency Use Authorization (EUA).  This EUA will remain in effect (meaning this test can be used) for the duration of the COVID-19 declaration under Section 564(b)(1) of the Act, 21 U.S.C. section 360bbb-3(b)(1), unless the authorization is terminated or revoked sooner.  Performed at [facility], [address], [place], [place] [address]       Studies: No results found.  Scheduled Meds:  amLODipine  5 mg Oral Daily   aspirin EC  81 mg Oral Daily   atorvastatin  40 mg Oral Daily   Chlorhexidine Gluconate Cloth  6 each Topical Q0600   dorzolamide-timolol  1 drop Both Eyes BID   doxercalciferol  7 mcg Intravenous Q T,Th,Sa-HD   heparin  5,000 Units Subcutaneous Q8H   heparin  5,000 Units Dialysis Once   latanoprost  1 drop Both Eyes QHS   sevelamer carbonate  1,600 mg Oral TID WC    Continuous Infusions:   LOS: 3 days     Bonnell Public, MD [facility]   If 7PM-7AM, please contact night-coverage [email or web address] Password [facility] 01/25/2022, 7:13 AM

## 2022-01-25 NOTE — Progress Notes (Signed)
SATURATION QUALIFICATIONS: (This note is used to comply with regulatory documentation for home oxygen)  Patient Saturations on Room Air at Rest = 99%  Patient Saturations on Room Air while Ambulating = 82%  Patient Saturations on 2 Liters of oxygen while Ambulating = 93%  Please briefly explain why patient needs home oxygen:

## 2022-01-25 NOTE — Progress Notes (Signed)
Mobility Specialist Progress Note:   01/25/22 1135  Mobility  Activity Ambulated with assistance in hallway  Level of Assistance Independent  Assistive Device None  Distance Ambulated (ft) 350 ft  Activity Response Tolerated well  $Mobility charge 1 Mobility   Pt received in chair. No complaints of pain. Left in chair with call bell in reach and all needs met.   [facility] Rilla Buckman Mobility Specialist

## 2022-01-25 NOTE — Consult Note (Signed)
   [facility] Inpatient Consult   01/25/2022  GARRETTE CAINE 08-27-1960 [id number]  [facility] [ACO] Patient: Adam Dennis [SNP]  Primary Care Provider:  Charlott Rakes, MD, [facility]   Patient screened for hospitalization to assess for potential [place] [facility] service needs for post hospital transition.  Review of patient's medical record reveals patient is admitted with hypoxia, has HD on TTS.  [facility] roster reveals patient is listed with the Special Needs Plan for care management.  Met with the patient at the bedside.  Patient endorses above and denies any additional needs.  Patient given an appointment reminder card with PCP and a 24 hour nurse advise line.  Plan:  Continue to follow progress and disposition to assess for post hospital care management needs.    For questions contact:   Natividad Brood, RN BSN [facility] Liaison  [phone] business mobile phone Toll free office [phone]  Fax number: [phone] [email or web address]

## 2022-01-25 NOTE — Care Management Important Message (Signed)
Important Message  Patient Details  Name: JEFFERY BACHMEIER MRN: [id number] Date of Birth: 02-18-1961   Medicare Important Message Given:  Yes     Shelda Altes 01/25/2022, 10:48 AM

## 2022-01-26 LAB — RENAL FUNCTION PANEL
Albumin: 2.9 g/dL — ABNORMAL LOW (ref 3.5–5.0)
Anion gap: 10 (ref 5–15)
BUN: 48 mg/dL — ABNORMAL HIGH (ref 6–20)
CO2: 29 mmol/L (ref 22–32)
Calcium: 9.4 mg/dL (ref 8.9–10.3)
Chloride: 98 mmol/L (ref 98–111)
Creatinine, Ser: 9.99 mg/dL — ABNORMAL HIGH (ref 0.61–1.24)
GFR, Estimated: 5 mL/min — ABNORMAL LOW (ref >60)
Glucose, Bld: 110 mg/dL — ABNORMAL HIGH (ref 70–99)
Phosphorus: 7.7 mg/dL — ABNORMAL HIGH (ref 2.5–4.6)
Potassium: 4.5 mmol/L (ref 3.5–5.1)
Sodium: 137 mmol/L (ref 135–145)

## 2022-01-26 LAB — GLUCOSE, CAPILLARY
Glucose-Capillary: 95 mg/dL (ref 70–99)
Glucose-Capillary: 137 mg/dL — ABNORMAL HIGH (ref 70–99)

## 2022-01-26 LAB — CBC
HCT: 38 % — ABNORMAL LOW (ref 39.0–52.0)
Hemoglobin: 11.5 g/dL — ABNORMAL LOW (ref 13.0–17.0)
MCH: 26.9 pg (ref 26.0–34.0)
MCHC: 30.3 g/dL (ref 30.0–36.0)
MCV: 88.8 fL (ref 80.0–100.0)
Platelets: 257 10*3/uL (ref 150–400)
RBC: 4.28 MIL/uL (ref 4.22–5.81)
RDW: 17.5 % — ABNORMAL HIGH (ref 11.5–15.5)
WBC: 5.5 10*3/uL (ref 4.0–10.5)
nRBC: 0 % (ref 0.0–0.2)

## 2022-01-26 MED ORDER — SEVELAMER CARBONATE 800 MG PO TABS: 2400.0000 mg | ORAL_TABLET | Freq: Three times a day (TID) | ORAL | 0 refills | Status: AC

## 2022-01-26 MED ORDER — SEVELAMER CARBONATE 800 MG PO TABS: 800.0000 mg | ORAL_TABLET | ORAL | 1 refills | Status: DC | PRN

## 2022-01-26 NOTE — Progress Notes (Signed)
[facility] Progress Note   Subjective: Seen on HD. Denies SOB. Tolerated UF without issues.    Objective Vitals:   01/26/22 0830 01/26/22 0900 01/26/22 0930 01/26/22 1000  BP: 138/88 123/74 (!) 142/76 (!) 142/76  Pulse: 70 71 70 70  Resp: 18 (!) 23 (!) 21 19  Temp:      TempSrc:      SpO2: 98% 100% 100%   Weight:      Height:       Physical Exam General: Pleasant older male in NAD Heart: S1, S2, no M/R/G. SR on monitor.  Lungs: CTAB, no WOB.  Abdomen: obese, NABS Extremities:no LE edema Dialysis Access: R AVF cannulated.   Additional Objective Labs: Basic Metabolic Panel: Recent Labs  Lab 01/23/22 1134 01/25/22 0627 01/26/22 0802  NA 136 135 137  K 4.7 4.5 4.5  CL 93* 94* 98  CO2 '29 31 29  '$ GLUCOSE 90 96 110*  BUN 27* 28* 48*  CREATININE 8.64* 7.54* 9.99*  CALCIUM 9.3 9.5 9.4  PHOS  --  6.6* 7.7*   Liver Function Tests: Recent Labs  Lab 01/22/22 0530 01/25/22 0627 01/26/22 0802  AST 18  --   --   ALT 16  --   --   ALKPHOS 50  --   --   BILITOT 0.7  --   --   PROT 7.7  --   --   ALBUMIN 3.0* 3.1* 2.9*   No results for input(s): "LIPASE", "AMYLASE" in the last 168 hours. CBC: Recent Labs  Lab 01/21/22 0845 01/22/22 0530 01/25/22 0627 01/26/22 0802  WBC 5.8 5.6 5.7 5.5  NEUTROABS 4.1  --   --   --   HGB 12.4* 12.2* 12.7* 11.5*  HCT 40.9 39.7 41.5 38.0*  MCV 90.3 88.0 87.2 88.8  PLT 242 263 262 257   Blood Culture    Component Value Date/Time   SDES BLOOD RIGHT HAND 06/30/2019 2000   SPECREQUEST  06/30/2019 2000    BOTTLES DRAWN AEROBIC ONLY Blood Culture results may not be optimal due to an inadequate volume of blood received in culture bottles   CULT  06/30/2019 2000    NO GROWTH 9 DAYS Performed at [facility], [place] [address] [place], [place] [address]    REPTSTATUS 07/09/2019 FINAL 06/30/2019 2000    Cardiac Enzymes: No results for input(s): "CKTOTAL", "CKMB", "CKMBINDEX", "TROPONINI" in the last 168  hours. CBG: Recent Labs  Lab 01/25/22 0555 01/25/22 1112 01/25/22 1628 01/25/22 2107 01/26/22 0610  GLUCAP 90 111* 98 95 95   Iron Studies: No results for input(s): "IRON", "TIBC", "TRANSFERRIN", "FERRITIN" in the last 72 hours. '@lablastinr3'$ @ Studies/Results: No results found. Medications:   amLODipine  5 mg Oral Daily   aspirin EC  81 mg Oral Daily   atorvastatin  40 mg Oral Daily   Chlorhexidine Gluconate Cloth  6 each Topical Q0600   dorzolamide-timolol  1 drop Both Eyes BID   doxercalciferol  7 mcg Intravenous Q T,Th,Sa-HD   heparin  5,000 Units Subcutaneous Q8H   heparin  5,000 Units Dialysis Once   latanoprost  1 drop Both Eyes QHS   sevelamer carbonate  2,400 mg Oral TID WC     Outpatient Dialysis Orders: Center: [facility] TueThuSat, 4 hrs 15 min, Optiflux 200NRe, BFR 400, DFR Autoflow 1.5, EDW 146.5 (kg), Dialysate 2.0 K, 2.0 Ca, Heparin 10200 unit bolus Hectorol 87mg IV q HD   Assessment/Plan:  Hypoxia: possibly multifactorial,  hx COPD and chronically elevated hemidiaphragms. May also be a bit volume overloaded from bowel prep. SOB improved after HD yesterday but still on O2  ESRD:  TTS schedule HD 01/26/2022  Hypertension/volume: See above, BP controlled, no volume overload on exam/imaging but is SOB. 1.9L net UF 8/31 Will challenge EDW, lower volume as tolerated. New EDW 145 kg  Anemia: Hb 11.5. No ESA indicated  Metabolic bone disease: Calcium controlled, phos 6.6.  - Continue hectorol and binders; incr renvela to 3 TIDM  Nutrition:  renal diet with fluid restrictions  Adam Matty H. Rosita Guzzetta NP-C 01/26/2022, 10:09 AM  [employer] [phone]

## 2022-01-26 NOTE — TOC Transition Note (Signed)
Transition of Care [facility]) - CM/SW Discharge Note   Patient Details  Name: Adam Dennis MRN: [id number] Date of Birth: 05/30/1960  Transition of Care [facility]) CM/SW Contact:  Carles Collet, RN Phone Number: 01/26/2022, 3:54 PM   Clinical Narrative:      Paient to DC home w NIV.  Notified [employer] who will have RT call patient once they are home to ensure that they are comfortable with set up at home tonight.        Patient Goals and CMS Choice        Discharge Placement                       Discharge Plan and Services                                     Social Determinants of Health (SDOH) Interventions     Readmission Risk Interventions    07/07/2019   12:53 PM  Readmission Risk Prevention Plan  Transportation Screening Complete  PCP or Specialist Appt within 5-7 Days Complete  Home Care Screening Complete  Medication Review (RN CM) Complete

## 2022-01-28 ENCOUNTER — Telehealth: Payer: Self-pay | Admitting: Nurse Practitioner

## 2022-01-28 NOTE — Telephone Encounter (Signed)
Transition of care contact from inpatient facility  Date of Discharge: 01/26/2022 Date of Contact: 01/28/2022 Method of contact: Phone  Attempted to contact patient to discuss transition of care from inpatient admission. Patient did not answer the phone. Message was left on the patient's voicemail with call back number [phone].

## 2022-01-29 ENCOUNTER — Telehealth: Payer: Self-pay | Admitting: *Deleted

## 2022-01-29 DIAGNOSIS — Z992 Dependence on renal dialysis: Secondary | ICD-10-CM | POA: Diagnosis not present

## 2022-01-29 DIAGNOSIS — N2581 Secondary hyperparathyroidism of renal origin: Secondary | ICD-10-CM | POA: Diagnosis not present

## 2022-01-29 DIAGNOSIS — N186 End stage renal disease: Secondary | ICD-10-CM | POA: Diagnosis not present

## 2022-01-29 NOTE — Patient Outreach (Signed)
  Care Coordination [facility] Note Transition Care Management Follow-up Telephone Call Date of discharge and from where: 01/26/22 [facility] How have you been since you were released from the hospital? Patient states he is doing better.  Any questions or concerns? No  Items Reviewed: Did the pt receive and understand the discharge instructions provided? Yes  Medications obtained and verified? Yes  Other? No  Any new allergies since your discharge? No  Dietary orders reviewed? Yes Do you have support at home? Yes   Home Care and Equipment/Supplies: Were home health services ordered? no If so, what is the name of the agency? N/A  Has the agency set up a time to come to the patient's home? not applicable Were any new equipment or medical supplies ordered?  No, received Bi-PAP in hospital What is the name of the medical supply agency? N/A Were you able to get the supplies/equipment? not applicable Do you have any questions related to the use of the equipment or supplies? No  Functional Questionnaire: (I = Independent and D = Dependent) ADLs: I  Bathing/Dressing- I  Meal Prep- I  Eating- I  Maintaining continence- I  Transferring/Ambulation- I  Managing Meds- I  Follow up appointments reviewed:  PCP Hospital f/u appt confirmed? Yes  Scheduled to see Dr. Margarita Rana on 02/21/22 @ 0930. [facility] f/u appt confirmed? No   Are transportation arrangements needed? No  If their condition worsens, is the pt aware to call PCP or go to the Emergency Dept.? Yes Was the patient provided with contact information for the PCP's office or ED? Yes Was to pt encouraged to call back with questions or concerns? Yes  SDOH assessments and interventions completed:   No  Care Coordination Interventions Activated:  No   Care Coordination Interventions:   N/A     Encounter Outcome:  Pt. Visit Completed    [name] RN, BSN [place] [phone] [email or web address]

## 2022-01-31 DIAGNOSIS — J441 Chronic obstructive pulmonary disease with (acute) exacerbation: Secondary | ICD-10-CM | POA: Diagnosis not present

## 2022-01-31 DIAGNOSIS — E8770 Fluid overload, unspecified: Secondary | ICD-10-CM | POA: Diagnosis not present

## 2022-01-31 DIAGNOSIS — Z992 Dependence on renal dialysis: Secondary | ICD-10-CM | POA: Diagnosis not present

## 2022-01-31 DIAGNOSIS — J9621 Acute and chronic respiratory failure with hypoxia: Secondary | ICD-10-CM | POA: Diagnosis not present

## 2022-01-31 DIAGNOSIS — N186 End stage renal disease: Secondary | ICD-10-CM | POA: Diagnosis not present

## 2022-01-31 DIAGNOSIS — N2581 Secondary hyperparathyroidism of renal origin: Secondary | ICD-10-CM | POA: Diagnosis not present

## 2022-02-02 DIAGNOSIS — N186 End stage renal disease: Secondary | ICD-10-CM | POA: Diagnosis not present

## 2022-02-02 DIAGNOSIS — N2581 Secondary hyperparathyroidism of renal origin: Secondary | ICD-10-CM | POA: Diagnosis not present

## 2022-02-02 DIAGNOSIS — Z992 Dependence on renal dialysis: Secondary | ICD-10-CM | POA: Diagnosis not present

## 2022-02-04 ENCOUNTER — Telehealth: Payer: Self-pay | Admitting: Emergency Medicine

## 2022-02-04 DIAGNOSIS — J438 Other emphysema: Secondary | ICD-10-CM

## 2022-02-04 NOTE — Telephone Encounter (Signed)
Copied from [place] [id number]. Topic: Referral - Request for Referral >> Feb 04, 2022 11:52 AM Ludger Nutting wrote: Has patient seen PCP for this complaint? Yes.   *If NO, is insurance requiring patient see PCP for this issue before PCP can refer them? Referral for which specialty: Pulmonologist Preferred provider/office: Any Reason for referral: Blood oxygen was low in [facility]

## 2022-02-04 NOTE — Telephone Encounter (Signed)
Routing to PCP for review.

## 2022-02-05 DIAGNOSIS — N2581 Secondary hyperparathyroidism of renal origin: Secondary | ICD-10-CM | POA: Diagnosis not present

## 2022-02-05 DIAGNOSIS — N186 End stage renal disease: Secondary | ICD-10-CM | POA: Diagnosis not present

## 2022-02-05 DIAGNOSIS — Z992 Dependence on renal dialysis: Secondary | ICD-10-CM | POA: Diagnosis not present

## 2022-02-05 NOTE — Telephone Encounter (Signed)
Done

## 2022-02-05 NOTE — Addendum Note (Signed)
Addended byCharlott Rakes on: 02/05/2022 08:45 AM   Modules accepted: Orders

## 2022-02-05 NOTE — Discharge Summary (Signed)
Physician Discharge Summary   Patient: Adam Dennis MRN: [id number] DOB: 12/23/1960  Admit date:     01/21/2022  Discharge date: 01/26/2022  Discharge Physician: Bonnell Public   PCP: Charlott Rakes, MD   Recommendations at discharge:   Follow-up with primary care provider and nephrology team within 1 week of discharge.  Discharge Diagnoses: Principal Problem:   Acute respiratory failure with hypoxia (HCC) Active Problems:   ESRD on dialysis ([place])   Diabetes mellitus type 2 in obese ([place])   Essential hypertension, benign   Glaucoma of right eye   Hyperlipidemia   Class 3 obesity ([place])  Resolved Problems:   * No resolved hospital problems. [facility] Course: Patient is a 61 year old male with past medical history significant for end-stage renal disease on hemodialysis, COPD, heart failure, hypertension, type 2 diabetes mellitus and morbid obesity.  Patient was admitted with acute combined hypoxic and hypercapnic respiratory failure and COPD exacerbation.  Apparently, patient had been scheduled for colonoscopy and was found to have O2 sat of 60 to 70%.  Patient was admitted to the hospital and managed for COPD with exacerbation and respiratory failure.  BiPAP was used at night and during the hospital stay.  Trelegy was recommended on discharge.  Patient has improved significantly and will be discharged back home to the care of the primary care provider and nephrology team.  Hemodialysis was continued during the hospital stay.  Assessment and Plan: Acute respiratory failure with hypoxia and hypercarbia, secondary to COPD exacerbation -Improving management as documented above. -Currently he is on 3 L/min per [place] with 02 saturation 96%.  And his oxygen requirement went up to 4 L with ambulation. -VBG with concern of CO2 retention, most likely COPD exacerbation-Switch nightly CPAP with BiPAP. -Continue with supplemental oxygen -Would benefit from NIV Trilegy. -TOC input is  appreciated.   ESRD on dialysis [facility]) -Hemodialysis was continued during the hospital stay. -Nephrology team directed management of ESRD.     Diabetes mellitus type 2 in obese (HCC) -Blood sugar control was optimized during the hospital stay.    Essential hypertension, benign Continued blood pressure control with amlodipine   Hyperlipidemia Continue with statin therapy.    Class 3 obesity (HCC) Calculated BMI is 43.2    OSA .  Switched CPAP to BiPAP due to hypercarbia.       Consultants: Nephrology Procedures performed: None Disposition: Home Diet recommendation:  Discharge Diet Orders (From admission, onward)     Start     Ordered   01/26/22 0000  Diet - low sodium heart healthy        01/26/22 1525           Cardiac and Carb modified diet, as well as, renal diet.  DISCHARGE MEDICATION: Allergies as of 01/26/2022   No Known Allergies      Medication List     STOP taking these medications    calcium carbonate 500 MG chewable tablet Commonly known as: TUMS - dosed in mg elemental calcium   Multivitamin Adult Tabs   Trulicity 1.5 KC/1.2XN Sopn Generic drug: Dulaglutide       TAKE these medications    Accu-Chek Aviva device Use as instructed to check blood sugar up to 3 times daily.   accu-chek softclix lancets Use as instructed daily.   Accu-Chek Softclix Lancets lancets Use as instructed to check blood sugar up to 3 times daily.   amLODipine 5 MG tablet Commonly known as: NORVASC TAKE 1 TABLET(5 MG) BY  MOUTH DAILY What changed: See the new instructions.   aspirin EC 81 MG tablet Take 81 mg by mouth daily.   atorvastatin 40 MG tablet Commonly known as: LIPITOR TAKE 1 TABLET(40 MG) BY MOUTH DAILY What changed: See the new instructions.   dorzolamide-timolol 22.3-6.8 MG/ML ophthalmic solution Commonly known as: COSOPT Place 1 drop into both eyes 2 (two) times daily.   FeroSul 325 (65 FE) MG tablet Generic drug: ferrous  sulfate TAKE 1 TABLET(325 MG) BY MOUTH DAILY WITH BREAKFAST What changed: See the new instructions.   glucose blood test strip Commonly known as: Accu-Chek Aviva Plus Use as instructed to check blood sugar up to 3 times daily.   HECTOROL IV Doxercalciferol (Hectorol)   methocarbamol 500 MG tablet Commonly known as: ROBAXIN Take 1 tablet (500 mg total) by mouth every 8 (eight) hours as needed for muscle spasms.   nitroGLYCERIN 0.4 MG SL tablet Commonly known as: NITROSTAT PLACE 1 TABLET BY MOUTH UNDER TONGUE EVERY 5 MINUTES FOR 3 DOSES AS NEEDED FOR CHEST PAIN What changed: See the new instructions.   sevelamer carbonate 800 MG tablet Commonly known as: RENVELA Take 3 tablets (2,400 mg total) by mouth 3 (three) times daily with meals. What changed:  how much to take when to take this additional instructions   sevelamer carbonate 800 MG tablet Commonly known as: RENVELA Take 1 tablet (800 mg total) by mouth as needed (with snacks). What changed: You were already taking a medication with the same name, and this prescription was added. Make sure you understand how and when to take each.   Travatan Z 0.004 % Soln ophthalmic solution Generic drug: Travoprost (BAK Free) Place 1 drop into both eyes at bedtime.        Discharge Exam: Filed Weights   01/23/22 0300 01/25/22 0536 01/26/22 0422  Weight: (!) 145.7 kg (!) 145.4 kg (!) 146.9 kg     Condition at discharge: stable  The results of significant diagnostics from this hospitalization (including imaging, microbiology, ancillary and laboratory) are listed below for reference.   Imaging Studies: CT Angio Chest PE W and/or Wo Contrast  Result Date: 01/21/2022 CLINICAL DATA:  Pulmonary embolism suspected. Positive D-dimer. Poor oxygen saturation. EXAM: CT ANGIOGRAPHY CHEST WITH CONTRAST TECHNIQUE: Multidetector CT imaging of the chest was performed using the standard protocol during bolus administration of intravenous  contrast. Multiplanar CT image reconstructions and MIPs were obtained to evaluate the vascular anatomy. RADIATION DOSE REDUCTION: This exam was performed according to the departmental dose-optimization program which includes automated exposure control, adjustment of the mA and/or kV according to patient size and/or use of iterative reconstruction technique. CONTRAST:  155m OMNIPAQUE IOHEXOL 350 MG/ML SOLN COMPARISON:  Chest radiography same day.  CT angiography 11/10/2021 FINDINGS: Cardiovascular: Heart size is normal. Minimal coronary artery calcification is seen. Minimal aortic atherosclerotic calcification is seen. Pulmonary arterial opacification is satisfactory. No pulmonary emboli. Prominent pulmonary arteries which could go along with chronic pulmonary arterial hypertension. Mediastinum/Nodes: No lymphadenopathy. 4 cm left thyroid nodule as seen previously and evaluated by ultrasound. See results of thyroid ultrasound 11/11/2021. Lungs/Pleura: Chronically elevated hemidiaphragms with mild chronic volume loss at both lung bases. Upper Abdomen: Negative Musculoskeletal: Chronic spinal degenerative changes. Review of the MIP images confirms the above findings. IMPRESSION: No pulmonary emboli. Chronically elevated hemidiaphragms with chronic volume loss at the lung bases. Pulmonary arterial prominence could go along with pulmonary arterial hypertension. Minimal coronary artery calcification and aortic atherosclerotic calcification. Electronically Signed   By: MElta Guadeloupe  Shogry M.D.   On: 01/21/2022 11:24   DG Chest 2 View  Result Date: 01/21/2022 CLINICAL DATA:  Hypoxia. EXAM: CHEST - 2 VIEW COMPARISON:  November 09, 2021. FINDINGS: Stable cardiomegaly. Stable elevated left hemidiaphragm is noted. Probable minimal bibasilar subsegmental atelectasis is noted. Bony thorax is unremarkable. IMPRESSION: Stable cardiomegaly and stable elevated left hemidiaphragm. Probable minimal bibasilar subsegmental atelectasis.  Electronically Signed   By: Marijo Conception M.D.   On: 01/21/2022 09:27    Microbiology: Results for orders placed or performed during the hospital encounter of 01/21/22  SARS Coronavirus 2 by RT PCR (hospital order, performed in [facility] hospital lab) *cepheid single result test* Anterior Nasal Swab     Status: None   Collection Time: 01/21/22  9:10 AM   Specimen: Anterior Nasal Swab  Result Value Ref Range Status   SARS Coronavirus 2 by RT PCR NEGATIVE NEGATIVE Final    Comment: (NOTE) SARS-CoV-2 target nucleic acids are NOT DETECTED.  The SARS-CoV-2 RNA is generally detectable in upper and lower respiratory specimens during the acute phase of infection. The lowest concentration of SARS-CoV-2 viral copies this assay can detect is 250 copies / mL. A negative result does not preclude SARS-CoV-2 infection and should not be used as the sole basis for treatment or other patient management decisions.  A negative result may occur with improper specimen collection / handling, submission of specimen other than nasopharyngeal swab, presence of viral mutation(s) within the areas targeted by this assay, and inadequate number of viral copies (<250 copies / mL). A negative result must be combined with clinical observations, patient history, and epidemiological information.  Fact Sheet for Patients:   https://www.patel.info/  Fact Sheet for Healthcare Providers: https://hall.com/  This test is not yet approved or  cleared by the [place] FDA and has been authorized for detection and/or diagnosis of SARS-CoV-2 by FDA under an Emergency Use Authorization (EUA).  This EUA will remain in effect (meaning this test can be used) for the duration of the COVID-19 declaration under Section 564(b)(1) of the Act, 21 U.S.C. section 360bbb-3(b)(1), unless the authorization is terminated or revoked sooner.  Performed at [facility], [place] [address], [place], [place] [address]     Labs: CBC: No results for input(s): "WBC", "NEUTROABS", "HGB", "HCT", "MCV", "PLT" in the last 168 hours. Basic Metabolic Panel: No results for input(s): "NA", "K", "CL", "CO2", "GLUCOSE", "BUN", "CREATININE", "CALCIUM", "MG", "PHOS" in the last 168 hours. Liver Function Tests: No results for input(s): "AST", "ALT", "ALKPHOS", "BILITOT", "PROT", "ALBUMIN" in the last 168 hours. CBG: No results for input(s): "GLUCAP" in the last 168 hours.  Discharge time spent: greater than 30 minutes.  Signed: Bonnell Public, MD [facility] 02/05/2022

## 2022-02-05 NOTE — Telephone Encounter (Signed)
Pt has been called and informed of referral being placed. 

## 2022-02-07 DIAGNOSIS — N186 End stage renal disease: Secondary | ICD-10-CM | POA: Diagnosis not present

## 2022-02-07 DIAGNOSIS — N2581 Secondary hyperparathyroidism of renal origin: Secondary | ICD-10-CM | POA: Diagnosis not present

## 2022-02-07 DIAGNOSIS — Z992 Dependence on renal dialysis: Secondary | ICD-10-CM | POA: Diagnosis not present

## 2022-02-09 DIAGNOSIS — N186 End stage renal disease: Secondary | ICD-10-CM | POA: Diagnosis not present

## 2022-02-09 DIAGNOSIS — Z992 Dependence on renal dialysis: Secondary | ICD-10-CM | POA: Diagnosis not present

## 2022-02-09 DIAGNOSIS — N2581 Secondary hyperparathyroidism of renal origin: Secondary | ICD-10-CM | POA: Diagnosis not present

## 2022-02-12 DIAGNOSIS — N2581 Secondary hyperparathyroidism of renal origin: Secondary | ICD-10-CM | POA: Diagnosis not present

## 2022-02-12 DIAGNOSIS — N186 End stage renal disease: Secondary | ICD-10-CM | POA: Diagnosis not present

## 2022-02-12 DIAGNOSIS — Z992 Dependence on renal dialysis: Secondary | ICD-10-CM | POA: Diagnosis not present

## 2022-02-14 DIAGNOSIS — Z992 Dependence on renal dialysis: Secondary | ICD-10-CM | POA: Diagnosis not present

## 2022-02-14 DIAGNOSIS — N186 End stage renal disease: Secondary | ICD-10-CM | POA: Diagnosis not present

## 2022-02-14 DIAGNOSIS — N2581 Secondary hyperparathyroidism of renal origin: Secondary | ICD-10-CM | POA: Diagnosis not present

## 2022-02-16 DIAGNOSIS — N2581 Secondary hyperparathyroidism of renal origin: Secondary | ICD-10-CM | POA: Diagnosis not present

## 2022-02-16 DIAGNOSIS — N186 End stage renal disease: Secondary | ICD-10-CM | POA: Diagnosis not present

## 2022-02-16 DIAGNOSIS — Z992 Dependence on renal dialysis: Secondary | ICD-10-CM | POA: Diagnosis not present

## 2022-02-19 DIAGNOSIS — N186 End stage renal disease: Secondary | ICD-10-CM | POA: Diagnosis not present

## 2022-02-19 DIAGNOSIS — N2581 Secondary hyperparathyroidism of renal origin: Secondary | ICD-10-CM | POA: Diagnosis not present

## 2022-02-19 DIAGNOSIS — Z992 Dependence on renal dialysis: Secondary | ICD-10-CM | POA: Diagnosis not present

## 2022-02-21 ENCOUNTER — Inpatient Hospital Stay: Payer: Medicare HMO | Admitting: Family Medicine

## 2022-02-21 DIAGNOSIS — N2581 Secondary hyperparathyroidism of renal origin: Secondary | ICD-10-CM | POA: Diagnosis not present

## 2022-02-21 DIAGNOSIS — N186 End stage renal disease: Secondary | ICD-10-CM | POA: Diagnosis not present

## 2022-02-21 DIAGNOSIS — Z992 Dependence on renal dialysis: Secondary | ICD-10-CM | POA: Diagnosis not present

## 2022-02-23 DIAGNOSIS — Z992 Dependence on renal dialysis: Secondary | ICD-10-CM | POA: Diagnosis not present

## 2022-02-23 DIAGNOSIS — E1122 Type 2 diabetes mellitus with diabetic chronic kidney disease: Secondary | ICD-10-CM | POA: Diagnosis not present

## 2022-02-23 DIAGNOSIS — N186 End stage renal disease: Secondary | ICD-10-CM | POA: Diagnosis not present

## 2022-02-23 DIAGNOSIS — N2581 Secondary hyperparathyroidism of renal origin: Secondary | ICD-10-CM | POA: Diagnosis not present

## 2022-02-25 ENCOUNTER — Ambulatory Visit: Payer: Medicare HMO | Attending: Family Medicine | Admitting: Family Medicine

## 2022-02-25 ENCOUNTER — Encounter: Payer: Self-pay | Admitting: Family Medicine

## 2022-02-25 VITALS — BP 110/76 | HR 88 | Ht 72.0 in | Wt 328.6 lb

## 2022-02-25 DIAGNOSIS — Z6841 Body Mass Index (BMI) 40.0 and over, adult: Secondary | ICD-10-CM

## 2022-02-25 DIAGNOSIS — J438 Other emphysema: Secondary | ICD-10-CM | POA: Diagnosis not present

## 2022-02-25 DIAGNOSIS — Z23 Encounter for immunization: Secondary | ICD-10-CM

## 2022-02-25 DIAGNOSIS — I1311 Hypertensive heart and chronic kidney disease without heart failure, with stage 5 chronic kidney disease, or end stage renal disease: Secondary | ICD-10-CM

## 2022-02-25 DIAGNOSIS — Z992 Dependence on renal dialysis: Secondary | ICD-10-CM | POA: Diagnosis not present

## 2022-02-25 DIAGNOSIS — J9611 Chronic respiratory failure with hypoxia: Secondary | ICD-10-CM | POA: Diagnosis not present

## 2022-02-25 DIAGNOSIS — E1122 Type 2 diabetes mellitus with diabetic chronic kidney disease: Secondary | ICD-10-CM

## 2022-02-25 DIAGNOSIS — N186 End stage renal disease: Secondary | ICD-10-CM | POA: Diagnosis not present

## 2022-02-25 MED ORDER — OZEMPIC (0.25 OR 0.5 MG/DOSE) 2 MG/1.5ML ~~LOC~~ SOPN: 0.2500 mg | PEN_INJECTOR | SUBCUTANEOUS | 6 refills | Status: DC

## 2022-02-25 NOTE — Progress Notes (Signed)
Subjective:  Patient ID: Adam Dennis, male    DOB: [birth year]  Age: 61 y.o. MRN: [id number]  CC: Hypertension   HPI Adam Dennis is a 61 y.o. year old male with a history of type 2 diabetes (A1c 6.0), hypertension, ESRD on HD, obstructive sleep apnea (on BiPAP at night), combined systolic and diastolic CHF (EF 74-08% from echo of 10/2021),emphysema, chronic respiratory failure ( on 4L oxygen)  who presents today for chronic condition management.   Hospitalized 01/22/2023 through 01/26/2022 for combined hypoxic and hypercapnic respiratory failure, COPD exacerbation. CTA chest was negative for PE but revealed findings suspicious for pulmonary arterial hypertension, coronary artery and aortic atherosclerotic calcification. His CPAP was switched to BiPAP due to hypercarbia. Subsequently discharged on supplemental oxygen.  Interval History: He reports doing well and is currently on 4 L of oxygen.  He has an upcoming appointment with pulmonary. Also remains on his BiPAP machine which he uses at night.  He has no chest pain and dyspnea is at baseline. 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  He informs me he cooks and then takes a short break to go get some oxygen and then returns back to clinic.  Hemodialysis sessions are going well. For his diabetes he was previously on Trulicity but states this was discontinued while he was admitted. He denies additional concerns today. Past Medical History:  Diagnosis Date   Arthritis    "back" (02/06/2017)   CHF (congestive heart failure) ([place])    new onset /Encounter Date 09/12/2006   Chronic combined systolic and diastolic heart failure (HCC)    CKD (chronic kidney disease) stage 4, GFR 15-29 ml/min (HCC)    COPD (chronic obstructive pulmonary disease) (HCC)    End stage renal disease on dialysis [facility])    Tues/ Thur/ Sat [place]   Glaucoma, right eye    High cholesterol    Hypertension    Hypertrophy of tonsils alone     Myocardial infarction ([place])    "small one; ~ 2008" (02/06/2017)   Obesity, unspecified    On home oxygen therapy    "4L; 24/7" (04/10/2017)   OSA on CPAP    Type II diabetes mellitus ([place])     Past Surgical History:  Procedure Laterality Date   AV FISTULA PLACEMENT Right 07/01/2019   Procedure: Right arm arteriovenous fistual;  Surgeon: Waynetta Sandy, MD;  Location: [place];  Service: Vascular;  Laterality: Right;   FISTULA SUPERFICIALIZATION Right 09/01/2019   Procedure: RIGHT UPPER ARM FISTULA  TRANSPOSITION OF RIGHT UPPPER ARM BRACHIAL CEPHALIC FISTULA;  Surgeon: Waynetta Sandy, MD;  Location: [place];  Service: Vascular;  Laterality: Right;   HERNIA REPAIR     INSERTION OF DIALYSIS CATHETER Right 07/01/2019   Procedure: INSERTION OF TUNNELED DIALYSIS CATHETER;  Surgeon: Waynetta Sandy, MD;  Location: [place];  Service: Vascular;  Laterality: Right;   TEE WITHOUT CARDIOVERSION N/A 04/16/2017   Procedure: TRANSESOPHAGEAL ECHOCARDIOGRAM (TEE);  Surgeon: [name], MD;  Location: [facility] ENDOSCOPY;  Service: Cardiovascular;  Laterality: N/A;   [place]   "when I was little baby"    Family History  Problem Relation Age of Onset   Hypertension Mother    Diabetes Sister    Colon cancer Sister    Diabetes Brother    Cancer Maternal Aunt    Amblyopia Neg Hx    Blindness Neg Hx    Cataracts Neg Hx  Glaucoma Neg Hx    Macular degeneration Neg Hx    Strabismus Neg Hx    Retinal detachment Neg Hx    Retinitis pigmentosa Neg Hx     Social History   Socioeconomic History   Marital status: Married    Spouse name: Not on file   Number of children: 0   Years of education: Not on file   Highest education level: Not on file  Occupational History   Occupation: works as a Training and development officer  Tobacco Use   Smoking status: Never   Smokeless tobacco: Never  Scientific laboratory technician Use: Never used  Substance and Sexual Activity   Alcohol use: Yes     Comment: occasional   Drug use: No   Sexual activity: Not Currently  Other Topics Concern   Not on file  Social History Narrative   Not on file   Social Determinants of Health   Financial Resource Strain: Not on file  Food Insecurity: Not on file  Transportation Needs: Not on file  Physical Activity: Not on file  Stress: Not on file  Social Connections: Not on file    No Known Allergies  Outpatient Medications Prior to Visit  Medication Sig Dispense Refill   ACCU-CHEK SOFTCLIX LANCETS lancets Use as instructed to check blood sugar up to 3 times daily. 100 each 11   amLODipine (NORVASC) 5 MG tablet TAKE 1 TABLET(5 MG) BY MOUTH DAILY (Patient taking differently: Take 5 mg by mouth daily.) 90 tablet 1   aspirin EC 81 MG tablet Take 81 mg by mouth daily.     atorvastatin (LIPITOR) 40 MG tablet TAKE 1 TABLET(40 MG) BY MOUTH DAILY (Patient taking differently: Take 40 mg by mouth daily.) 90 tablet 1   Blood Glucose Monitoring Suppl (ACCU-CHEK AVIVA) device Use as instructed to check blood sugar up to 3 times daily. 1 each 0   dorzolamide-timolol (COSOPT) 22.3-6.8 MG/ML ophthalmic solution Place 1 drop into both eyes 2 (two) times daily.     Doxercalciferol (HECTOROL IV) Doxercalciferol (Hectorol)     FEROSUL 325 (65 Fe) MG tablet TAKE 1 TABLET(325 MG) BY MOUTH DAILY WITH BREAKFAST (Patient taking differently: Take 325 mg by mouth Every Tuesday,Thursday,and Saturday with dialysis.) 100 tablet 3   glucose blood (ACCU-CHEK AVIVA PLUS) test strip Use as instructed to check blood sugar up to 3 times daily. 100 each 11   Lancet Devices (ACCU-CHEK SOFTCLIX) lancets Use as instructed daily. 1 each 5   methocarbamol (ROBAXIN) 500 MG tablet Take 1 tablet (500 mg total) by mouth every 8 (eight) hours as needed for muscle spasms. 60 tablet 1   nitroGLYCERIN (NITROSTAT) 0.4 MG SL tablet PLACE 1 TABLET BY MOUTH UNDER TONGUE EVERY 5 MINUTES FOR 3 DOSES AS NEEDED FOR CHEST PAIN (Patient taking differently:  Place 0.4 mg under the tongue every 5 (five) minutes as needed for chest pain.) 25 tablet 0   sevelamer carbonate (RENVELA) 800 MG tablet Take 3 tablets (2,400 mg total) by mouth 3 (three) times daily with meals. 270 tablet 0   sevelamer carbonate (RENVELA) 800 MG tablet Take 1 tablet (800 mg total) by mouth as needed (with snacks). 90 tablet 1   TRAVATAN Z 0.004 % SOLN ophthalmic solution Place 1 drop into both eyes at bedtime.  12   No facility-administered medications prior to visit.     ROS Review of Systems  Constitutional:  Negative for activity change and appetite change.  HENT:  Negative for sinus pressure and  sore throat.   Respiratory:  Negative for chest tightness, shortness of breath and wheezing.   Cardiovascular:  Negative for chest pain and palpitations.  Gastrointestinal:  Negative for abdominal distention, abdominal pain and constipation.  Genitourinary: Negative.   Musculoskeletal: Negative.   Psychiatric/Behavioral:  Negative for behavioral problems and dysphoric mood.     Objective:  BP 110/76   Pulse 88   Ht 6' (1.829 m)   Wt (!) 328 lb 9.6 oz (149.1 kg)   SpO2 99%   BMI 44.57 kg/m      02/25/2022    9:24 AM 01/26/2022    2:15 PM 01/26/2022    1:00 PM  BP/Weight  Systolic BP 097 353 299  Diastolic BP 76 89 96  Wt. (Lbs) 328.6    BMI 44.57 kg/m2        Physical Exam Constitutional:      Appearance: He is well-developed. He is obese.  HENT:     Head:     Comments: On 4L oxygen via nasal cannula Cardiovascular:     Rate and Rhythm: Normal rate.     Heart sounds: Normal heart sounds. No murmur heard. Pulmonary:     Effort: Pulmonary effort is normal.     Breath sounds: Normal breath sounds. No wheezing or rales.  Chest:     Chest wall: No tenderness.  Abdominal:     General: Bowel sounds are normal. There is no distension.     Palpations: Abdomen is soft. There is no mass.     Tenderness: There is no abdominal tenderness.  Musculoskeletal:         General: Normal range of motion.     Right lower leg: No edema.     Left lower leg: No edema.  Neurological:     Mental Status: He is alert and oriented to person, place, and time.  Psychiatric:        Mood and Affect: Mood normal.        Latest Ref Rng & Units 01/26/2022    8:02 AM 01/25/2022    6:27 AM 01/23/2022   11:34 AM  CMP  Glucose 70 - 99 mg/dL 110  96  90   BUN 6 - 20 mg/dL 48  28  27   Creatinine 0.61 - 1.24 mg/dL 9.99  7.54  8.64   Sodium 135 - 145 mmol/L 137  135  136   Potassium 3.5 - 5.1 mmol/L 4.5  4.5  4.7   Chloride 98 - 111 mmol/L 98  94  93   CO2 22 - 32 mmol/L '29  31  29   '$ Calcium 8.9 - 10.3 mg/dL 9.4  9.5  9.3     Lipid Panel     Component Value Date/Time   CHOL 136 11/11/2021 0057   CHOL 146 08/20/2021 1056   TRIG 104 11/11/2021 0057   HDL 66 11/11/2021 0057   HDL 54 08/20/2021 1056   CHOLHDL 2.1 11/11/2021 0057   VLDL 21 11/11/2021 0057   LDLCALC 49 11/11/2021 0057   LDLCALC 72 08/20/2021 1056    CBC    Component Value Date/Time   WBC 5.5 01/26/2022 0802   RBC 4.28 01/26/2022 0802   HGB 11.5 (L) 01/26/2022 0802   HGB 12.8 (L) 03/24/2019 1017   HCT 38.0 (L) 01/26/2022 0802   HCT 39.7 03/24/2019 1017   PLT 257 01/26/2022 0802   PLT 191 03/24/2019 1017   MCV 88.8 01/26/2022 0802   MCV 87 03/24/2019  1017   MCH 26.9 01/26/2022 0802   MCHC 30.3 01/26/2022 0802   RDW 17.5 (H) 01/26/2022 0802   RDW 13.9 03/24/2019 1017   LYMPHSABS 1.0 01/21/2022 0845   LYMPHSABS 1.3 03/24/2019 1017   MONOABS 0.6 01/21/2022 0845   EOSABS 0.1 01/21/2022 0845   EOSABS 0.0 03/24/2019 1017   BASOSABS 0.0 01/21/2022 0845   BASOSABS 0.0 03/24/2019 1017    Lab Results  Component Value Date   HGBA1C 6.0 (H) 01/21/2022    Assessment & Plan:  1. Other emphysema ([place]) He does report recently doing well since hospitalization I was previously prescribing him Symbicort a couple of years ago but he discontinued this stating he did not need it. Advised to keep  appointment with pulmonary Underlying pulmonary hypertension could also be contributing to his chronic condition  2. Type 2 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, without long-term current use of insulin (HCC) Controlled with A1c of 6.0 Discussed cardiovascular and weight loss benefits of GLP-1 RA and after shared decision making he has consented to being placed on Ozempic Counseled on Diabetic diet, my plate method, 740 minutes of moderate intensity exercise/week Blood sugar logs with fasting goals of 80-120 mg/dl, random of less than 180 and in the event of sugars less than 60 mg/dl or greater than 400 mg/dl encouraged to notify the clinic. Advised on the need for annual eye exams, annual foot exams, Pneumonia vaccine. - Semaglutide,0.25 or 0.'5MG'$ /DOS, (OZEMPIC, 0.25 OR 0.5 MG/DOSE,) 2 MG/1.5ML SOPN; Inject 0.25 mg into the skin once a week.  Dispense: 2 mL; Refill: 6  3. Morbid obesity ([place]) Advised to decrease caloric intake He is unable to exercise much due to respiratory condition Hopefully initiation of GLP-1 RA will be beneficial  4. 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 Chronic respiratory failure with hypoxia (HCC) Stable on 4 L of oxygen Advised that I do not think it is a good idea for him to continue working especially since he is on oxygen He will discuss this with his pulmonologist  6. Need for immunization against influenza - Flu Vaccine QUAD 25moIM (Fluarix, Fluzone & Alfiuria Quad PF)    Meds ordered this encounter  Medications   Semaglutide,0.25 or 0.'5MG'$ /DOS, (OZEMPIC, 0.25 OR 0.5 MG/DOSE,) 2 MG/1.5ML SOPN    Sig: Inject 0.25 mg into the skin once a week.    Dispense:  2 mL    Refill:  6    Follow-up: Return in about 1 month (around 03/28/2022) for Medicare wellness exam; chronic medical conditions in 6 months.       ECharlott Rakes MD, FAAFP. [facility] [place]  02/25/2022, 11:27 AM

## 2022-02-25 NOTE — Patient Instructions (Signed)
Diabetes Mellitus and Foot Care Foot care is an important part of your health, especially when you have diabetes. Diabetes may cause you to have problems because of poor blood flow (circulation) to your feet and legs, which can cause your skin to: Become thinner and drier. Break more easily. Heal more slowly. Peel and crack. You may also have nerve damage (neuropathy) in your legs and feet, causing decreased feeling in them. This means that you may not notice minor injuries to your feet that could lead to more serious problems. Noticing and addressing any potential problems early is the best way to prevent future foot problems. How to care for your feet Foot hygiene  Wash your feet daily with warm water and mild soap. Do not use hot water. Then, pat your feet and the areas between your toes until they are completely dry. Do not soak your feet as this can dry your skin. Trim your toenails straight across. Do not dig under them or around the cuticle. File the edges of your nails with an emery board or nail file. Apply a moisturizing lotion or petroleum jelly to the skin on your feet and to dry, brittle toenails. Use lotion that does not contain alcohol and is unscented. Do not apply lotion between your toes. Shoes and socks Wear clean socks or stockings every day. Make sure they are not too tight. Do not wear knee-high stockings since they may decrease blood flow to your legs. Wear shoes that fit properly and have enough cushioning. Always look in your shoes before you put them on to be sure there are no objects inside. To break in new shoes, wear them for just a few hours a day. This prevents injuries on your feet. Wounds, scrapes, corns, and calluses  Check your feet daily for blisters, cuts, bruises, sores, and redness. If you cannot see the bottom of your feet, use a mirror or ask someone for help. Do not cut corns or calluses or try to remove them with medicine. If you find a minor scrape,  cut, or break in the skin on your feet, keep it and the skin around it clean and dry. You may clean these areas with mild soap and water. Do not clean the area with peroxide, alcohol, or iodine. If you have a wound, scrape, corn, or callus on your foot, look at it several times a day to make sure it is healing and not infected. Check for: Redness, swelling, or pain. Fluid or blood. Warmth. Pus or a bad smell. General tips Do not cross your legs. This may decrease blood flow to your feet. Do not use heating pads or hot water bottles on your feet. They may burn your skin. If you have lost feeling in your feet or legs, you may not know this is happening until it is too late. Protect your feet from hot and cold by wearing shoes, such as at the beach or on hot pavement. Schedule a complete foot exam at least once a year (annually) or more often if you have foot problems. Report any cuts, sores, or bruises to your health care provider immediately. Where to find more information American Diabetes Association: www.diabetes.org Association of Diabetes Care & Education Specialists: www.diabeteseducator.org Contact a health care provider if: You have a medical condition that increases your risk of infection and you have any cuts, sores, or bruises on your feet. You have an injury that is not healing. You have redness on your legs or feet. You   feel burning or tingling in your legs or feet. You have pain or cramps in your legs and feet. Your legs or feet are numb. Your feet always feel cold. You have pain around any toenails. Get help right away if: You have a wound, scrape, corn, or callus on your foot and: You have pain, swelling, or redness that gets worse. You have fluid or blood coming from the wound, scrape, corn, or callus. Your wound, scrape, corn, or callus feels warm to the touch. You have pus or a bad smell coming from the wound, scrape, corn, or callus. You have a fever. You have a red  line going up your leg. Summary Check your feet every day for blisters, cuts, bruises, sores, and redness. Apply a moisturizing lotion or petroleum jelly to the skin on your feet and to dry, brittle toenails. Wear shoes that fit properly and have enough cushioning. If you have foot problems, report any cuts, sores, or bruises to your health care provider immediately. Schedule a complete foot exam at least once a year (annually) or more often if you have foot problems. This information is not intended to replace advice given to you by your health care provider. Make sure you discuss any questions you have with your health care provider. Document Revised: 12/02/2019 Document Reviewed: 12/02/2019 Elsevier Patient Education  2023 [employer] Inc.  

## 2022-02-26 DIAGNOSIS — N186 End stage renal disease: Secondary | ICD-10-CM | POA: Diagnosis not present

## 2022-02-26 DIAGNOSIS — Z992 Dependence on renal dialysis: Secondary | ICD-10-CM | POA: Diagnosis not present

## 2022-02-26 DIAGNOSIS — N2581 Secondary hyperparathyroidism of renal origin: Secondary | ICD-10-CM | POA: Diagnosis not present

## 2022-02-28 DIAGNOSIS — N186 End stage renal disease: Secondary | ICD-10-CM | POA: Diagnosis not present

## 2022-02-28 DIAGNOSIS — N2581 Secondary hyperparathyroidism of renal origin: Secondary | ICD-10-CM | POA: Diagnosis not present

## 2022-02-28 DIAGNOSIS — Z992 Dependence on renal dialysis: Secondary | ICD-10-CM | POA: Diagnosis not present

## 2022-03-02 DIAGNOSIS — N186 End stage renal disease: Secondary | ICD-10-CM | POA: Diagnosis not present

## 2022-03-02 DIAGNOSIS — N2581 Secondary hyperparathyroidism of renal origin: Secondary | ICD-10-CM | POA: Diagnosis not present

## 2022-03-02 DIAGNOSIS — Z992 Dependence on renal dialysis: Secondary | ICD-10-CM | POA: Diagnosis not present

## 2022-03-05 DIAGNOSIS — Z992 Dependence on renal dialysis: Secondary | ICD-10-CM | POA: Diagnosis not present

## 2022-03-05 DIAGNOSIS — N186 End stage renal disease: Secondary | ICD-10-CM | POA: Diagnosis not present

## 2022-03-05 DIAGNOSIS — N2581 Secondary hyperparathyroidism of renal origin: Secondary | ICD-10-CM | POA: Diagnosis not present

## 2022-03-06 ENCOUNTER — Ambulatory Visit (INDEPENDENT_AMBULATORY_CARE_PROVIDER_SITE_OTHER): Payer: Medicare HMO | Admitting: Pulmonary Disease

## 2022-03-06 ENCOUNTER — Encounter: Payer: Self-pay | Admitting: Pulmonary Disease

## 2022-03-06 VITALS — BP 122/68 | HR 85 | Temp 97.9°F | Ht 72.0 in | Wt 372.2 lb

## 2022-03-06 DIAGNOSIS — J9611 Chronic respiratory failure with hypoxia: Secondary | ICD-10-CM

## 2022-03-06 NOTE — Patient Instructions (Addendum)
Nice to meet you  I think the need for oxygen is related to extra pressure on the lungs from the chest and abdomen.  I think that you will need oxygen for the foreseeable future.  I think the only way I see reducing or coming off the oxygen is with weight loss.  I know this is very difficult to achieve.  I am so glad you are using the breathing mask at night.  Please continue to use this anytime you sleep.  I think it really will help keep the fluid off and keep stress off of your heart.  I am happy to help in any way in the future.  I think we should meet at least once a year to make sure your oxygen therapy is up-to-date.  Please contact me sooner if I can help in any way.  Return to clinic in 1 year or sooner as needed with Dr. Silas Flood

## 2022-03-06 NOTE — Progress Notes (Signed)
$'@Patient'r$  ID: Adam Dennis, male    DOB: 12-25-1960, 61 y.o.   MRN: [id number]  Chief Complaint  Patient presents with   Consult    Pt states when he has to take his o2 off he feel SOB    Referring provider: Charlott Rakes, MD  HPI:   61 y.o. man whom are seen in consultation for evaluation of chronic hypoxemic respiratory failure.  Most recent PCP note reviewed.  Discharge summary 01/2022 reviewed.  Patient states he needed oxygen in the past.  Had occurred.  Time that went away.  Unclear why this was, whether he no longer needed or just no longer had access to it.  He was hospitalized in late 12/2021 for respiratory failure.  There was concern for COPD exacerbation.  He was given antibiotics and steroids.  Notably, he has no history of COPD.  PFTs in 2018 that did not demonstrate fixed obstruction.  He had a CTA PE protocol performed at that time that on my review and interpretation reveals clear lungs although motion artifact degrades and inhibits full interpretation, no evidence of PE.  He had a CTA PE protocol performed 10/2021 in addition, prior to hospitalization, but on my review interpretation reveals clear lungs bilaterally, no evidence of emphysema or interstitial abnormality, no edema or pleural effusion, no PE.  Review of labs indicate mildly elevated bicarbonate.  He does carry a diagnosis of OSA based on polysomnography.  He was initially on CPAP.  Simply transition to trilogy ventilator.  He reports good adherence to NIPPV at night when he sleeps.  He has shortness of breath.  It comes and goes.  Worse when he is not wearing his oxygen.  When he wears his oxygen he does okay.  At rest he has no issues.  No time of day when things are better or worse.  No position that makes things better or worse.  No seasonal environmental factors he can identify that makes it better or worse.  He has used several different inhalers in the past.  None of them helped his symptoms of dyspnea.  No  other alleviating or exacerbating factors.  Most recent TTE 10/2021 that on my review indicates normal RV size, normal RV function, normal RA size, normal estimated right-sided pressures.  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, OSA on trilogy ventilator Surgical history: Reviewed, he denies any other he has had a placement of AV fistula  Family history: Mother with hypertension, sister with diabetes, brother with diabetes Social history: Never smoker, lives in [place], used to be a Training and development officer at [employer] Family History  Problem Relation Age of Onset   Hypertension Mother    Diabetes Sister    Colon cancer Sister    Diabetes Brother    Cancer Maternal Aunt    Amblyopia Neg Hx    Blindness Neg Hx    Cataracts Neg Hx    Glaucoma Neg Hx    Macular degeneration Neg Hx    Strabismus Neg Hx    Retinal detachment Neg Hx    Retinitis pigmentosa Neg Hx        Questionaires / Pulmonary Flowsheets:   ACT:      No data to display          MMRC:     No data to display          [facility]:      No data to display  Tests:   FENO:  No results found for: "NITRICOXIDE"  PFT:    Latest Ref Rng & Units 02/19/2017    9:25 AM  PFT Results  FVC-Pre L 1.17   FVC-Predicted Pre % 28   FVC-Post L 1.21   FVC-Predicted Post % 29   Pre FEV1/FVC % % 85   Post FEV1/FCV % % 84   FEV1-Pre L 1.00   FEV1-Predicted Pre % 30   FEV1-Post L 1.03   DLCO uncorrected ml/min/mmHg 11.58   DLCO UNC% % 35   DLCO corrected ml/min/mmHg 12.87   DLCO COR %Predicted % 39   DLVA Predicted % 119   TLC L 3.02   TLC % Predicted % 43   RV % Predicted % 83   Personally reviewed interpreted as no fixed obstruction, no bronchodilator response, severely reduced TLC, severe restriction  WALK:      No data to display          Imaging: Personally reviewed and as per EMR discussion this note No results found.  Lab  Results: Personally reviewed CBC    Component Value Date/Time   WBC 5.5 01/26/2022 0802   RBC 4.28 01/26/2022 0802   HGB 11.5 (L) 01/26/2022 0802   HGB 12.8 (L) 03/24/2019 1017   HCT 38.0 (L) 01/26/2022 0802   HCT 39.7 03/24/2019 1017   PLT 257 01/26/2022 0802   PLT 191 03/24/2019 1017   MCV 88.8 01/26/2022 0802   MCV 87 03/24/2019 1017   MCH 26.9 01/26/2022 0802   MCHC 30.3 01/26/2022 0802   RDW 17.5 (H) 01/26/2022 0802   RDW 13.9 03/24/2019 1017   LYMPHSABS 1.0 01/21/2022 0845   LYMPHSABS 1.3 03/24/2019 1017   MONOABS 0.6 01/21/2022 0845   EOSABS 0.1 01/21/2022 0845   EOSABS 0.0 03/24/2019 1017   BASOSABS 0.0 01/21/2022 0845   BASOSABS 0.0 03/24/2019 1017    BMET    Component Value Date/Time   NA 137 01/26/2022 0802   NA 141 03/24/2019 1017   K 4.5 01/26/2022 0802   CL 98 01/26/2022 0802   CO2 29 01/26/2022 0802   GLUCOSE 110 (H) 01/26/2022 0802   BUN 48 (H) 01/26/2022 0802   BUN 36 (H) 03/24/2019 1017   CREATININE 9.99 (H) 01/26/2022 0802   CREATININE 2.89 (H) 11/30/2015 1250   CALCIUM 9.4 01/26/2022 0802   GFRNONAA 5 (L) 01/26/2022 0802   GFRNONAA 24 (L) 11/30/2015 1250   GFRAA 8 (L) 07/06/2019 0754   GFRAA 27 (L) 11/30/2015 1250    BNP    Component Value Date/Time   BNP 142.6 (H) 01/23/2022 1134    ProBNP    Component Value Date/Time   PROBNP 227.1 (H) 11/12/2013 0953    Specialty Problems       Pulmonary Problems   HYPERTROPHY, TONSILS    Qualifier: Diagnosis of  By: Annamaria Boots MD, Clinton D       Chronic respiratory failure ([place])   Other emphysema ([place])   Acute respiratory failure with hypoxia ([place])    No Known Allergies  Immunization History  Administered Date(s) Administered   Influenza Whole 06/02/2009   Influenza,inj,Quad PF,6+ Mos 02/15/2013, 02/10/2015, 02/07/2017, 02/25/2022   Pneumococcal Polysaccharide-23 12/13/2012   Tdap 06/26/2015   Zoster Recombinat (Shingrix) 01/08/2021, 03/12/2021    Past Medical History:   Diagnosis Date   Arthritis    "back" (02/06/2017)   CHF (congestive heart failure) ([place])    new onset /Encounter Date 09/12/2006   Chronic combined systolic and  diastolic heart failure (H[facility])    CKD (chronic kidney disease) stage 4, GFR 15-29 ml/min (H[facility])    COPD (chronic obstructive pulmonary disease) ([facility])    End stage renal disease on dialysis [facility])    Tues/ Thur/ Sat [place]   Glaucoma, right eye    High cholesterol    Hypertension    Hypertrophy of tonsils alone    Myocardial infarction ([place])    "small one; ~ 2008" (02/06/2017)   Obesity, unspecified    On home oxygen therapy    "4L; 24/7" (04/10/2017)   OSA on CPAP    Type II diabetes mellitus (HCC)     Tobacco History: Social History   Tobacco Use  Smoking Status Never  Smokeless Tobacco Never   Counseling given: Not Answered   Continue to not smoke  Outpatient Encounter Medications as of 03/06/2022  Medication Sig   ACCU-CHEK SOFTCLIX LANCETS lancets Use as instructed to check blood sugar up to 3 times daily.   amLODipine (NORVASC) 5 MG tablet TAKE 1 TABLET(5 MG) BY MOUTH DAILY (Patient taking differently: Take 5 mg by mouth daily.)   aspirin EC 81 MG tablet Take 81 mg by mouth daily.   atorvastatin (LIPITOR) 40 MG tablet TAKE 1 TABLET(40 MG) BY MOUTH DAILY (Patient taking differently: Take 40 mg by mouth daily.)   Blood Glucose Monitoring Suppl (ACCU-CHEK AVIVA) device Use as instructed to check blood sugar up to 3 times daily.   dorzolamide-timolol (COSOPT) 22.3-6.8 MG/ML ophthalmic solution Place 1 drop into both eyes 2 (two) times daily.   Doxercalciferol (HECTOROL IV) Doxercalciferol (Hectorol)   FEROSUL 325 (65 Fe) MG tablet TAKE 1 TABLET(325 MG) BY MOUTH DAILY WITH BREAKFAST (Patient taking differently: Take 325 mg by mouth Every Tuesday,Thursday,and Saturday with dialysis.)   glucose blood (ACCU-CHEK AVIVA PLUS) test strip Use as instructed to check blood sugar up to 3 times daily.   Lancet Devices  (ACCU-CHEK SOFTCLIX) lancets Use as instructed daily.   methocarbamol (ROBAXIN) 500 MG tablet Take 1 tablet (500 mg total) by mouth every 8 (eight) hours as needed for muscle spasms.   nitroGLYCERIN (NITROSTAT) 0.4 MG SL tablet PLACE 1 TABLET BY MOUTH UNDER TONGUE EVERY 5 MINUTES FOR 3 DOSES AS NEEDED FOR CHEST PAIN (Patient taking differently: Place 0.4 mg under the tongue every 5 (five) minutes as needed for chest pain.)   Semaglutide,0.25 or 0.'5MG'$ /DOS, (OZEMPIC, 0.25 OR 0.5 MG/DOSE,) 2 MG/1.5ML SOPN Inject 0.25 mg into the skin once a week.   sevelamer carbonate (RENVELA) 800 MG tablet Take 1 tablet (800 mg total) by mouth as needed (with snacks).   TRAVATAN Z 0.004 % SOLN ophthalmic solution Place 1 drop into both eyes at bedtime.   No facility-administered encounter medications on file as of 03/06/2022.     Review of Systems  Review of Systems  No chest pain with exertion.  No orthopnea or PND.  Comprehensive review of systems otherwise negative. Physical Exam  BP 122/68 (BP Location: Left Arm, Patient Position: Sitting, Cuff Size: Normal)   Pulse 85   Temp 97.9 F (36.6 C) (Oral)   Ht 6' (1.829 m)   Wt (!) 372 lb 3.2 oz (168.8 kg)   SpO2 95%   BMI 50.48 kg/m   Wt Readings from Last 5 Encounters:  03/06/22 (!) 372 lb 3.2 oz (168.8 kg)  02/25/22 (!) 328 lb 9.6 oz (149.1 kg)  01/26/22 (!) 323 lb 13.7 oz (146.9 kg)  01/21/22 (!) 325 lb (147.4 kg)  11/21/21 Marland Kitchen)  330 lb 3.2 oz (149.8 kg)    BMI Readings from Last 5 Encounters:  03/06/22 50.48 kg/m  02/25/22 44.57 kg/m  01/26/22 43.92 kg/m  01/21/22 45.33 kg/m  11/21/21 46.05 kg/m     Physical Exam General: Sitting in chair, no acute distress Eyes: EOMI, no icterus Neck: Supple, no JVP Pulmonary: Distant, clear, normal work of breathing Cardiovascular: Warm, no edema Abdomen: Distended, bowel sounds present MSK: No synovitis, no joint effusion Neuro: Normal gait, no weakness Psych: Normal mood, full  affect   Assessment & Plan:   Chronic hypoxemic respiratory failure: Suspect largely driven by OHS/OSA given his habitus as well as PFTs 2018 with severe restriction and severely reduced ERV.  There is no evidence of COPD on his prior PFTs, he is a never smoker, I do not think his exacerbation for breathing related to COPD.  Possibly has underlying asthma although he denies significant atopic symptoms or history of the same.  He has used multiple inhalers in the past without any significant positive effect on his shortness of breath.  It is possible he has intermittent volume overload between dialysis sessions although recent CT scan does not show any sign of pulmonary edema or pleural effusion.  Fortunately, recent TTE 10/2021 reveals normal RV size and function, normal RA size, normal estimated RVSP.  Recommended weight loss.  OSA on NIPPV: Previously on CPAP.  Now using trilogy ventilator BiPAP versus AVAPS, unclear based on documentation in the chart in his description.  Recommend strict adherence to NIPPV at night or anytime he sleeping.  Counseled on the importance of helping with fluid retention as well as mitigating or decreasing risk of pulmonary hypertension.   Return in about 1 year (around 03/07/2023).   Lanier Clam, MD 03/06/2022

## 2022-03-07 DIAGNOSIS — N186 End stage renal disease: Secondary | ICD-10-CM | POA: Diagnosis not present

## 2022-03-07 DIAGNOSIS — N2581 Secondary hyperparathyroidism of renal origin: Secondary | ICD-10-CM | POA: Diagnosis not present

## 2022-03-07 DIAGNOSIS — Z992 Dependence on renal dialysis: Secondary | ICD-10-CM | POA: Diagnosis not present

## 2022-03-09 DIAGNOSIS — Z992 Dependence on renal dialysis: Secondary | ICD-10-CM | POA: Diagnosis not present

## 2022-03-09 DIAGNOSIS — N186 End stage renal disease: Secondary | ICD-10-CM | POA: Diagnosis not present

## 2022-03-09 DIAGNOSIS — N2581 Secondary hyperparathyroidism of renal origin: Secondary | ICD-10-CM | POA: Diagnosis not present

## 2022-03-11 ENCOUNTER — Telehealth: Payer: Self-pay | Admitting: Emergency Medicine

## 2022-03-11 NOTE — Telephone Encounter (Signed)
Copied from [place] [id number]. Topic: General - Other >> Mar 11, 2022  4:03 PM Tiffany B wrote: Caller checking on the status of certified medical necessity form for patient oxygen. Forms were faxed on 03/06/2022 and will re fax to [phone], please note when received.

## 2022-03-12 DIAGNOSIS — Z992 Dependence on renal dialysis: Secondary | ICD-10-CM | POA: Diagnosis not present

## 2022-03-12 DIAGNOSIS — N186 End stage renal disease: Secondary | ICD-10-CM | POA: Diagnosis not present

## 2022-03-12 DIAGNOSIS — N2581 Secondary hyperparathyroidism of renal origin: Secondary | ICD-10-CM | POA: Diagnosis not present

## 2022-03-14 ENCOUNTER — Telehealth: Payer: Self-pay | Admitting: Emergency Medicine

## 2022-03-14 DIAGNOSIS — N186 End stage renal disease: Secondary | ICD-10-CM | POA: Diagnosis not present

## 2022-03-14 DIAGNOSIS — Z992 Dependence on renal dialysis: Secondary | ICD-10-CM | POA: Diagnosis not present

## 2022-03-14 DIAGNOSIS — N2581 Secondary hyperparathyroidism of renal origin: Secondary | ICD-10-CM | POA: Diagnosis not present

## 2022-03-14 NOTE — Telephone Encounter (Signed)
Copied from [place] [id number]. Topic: General - Inquiry >> Mar 14, 2022  2:42 PM Ja-Kwan M wrote: Reason for CRM: Ardyth Gal with [employer] called for update on the certified medical necessity form for patient oxygen. Per Ardyth Gal the forms were faxed on 03/06/2022 and again on 03/11/22. Cb# [id number]   fax # [phone]

## 2022-03-14 NOTE — Telephone Encounter (Signed)
Duplicate message. 

## 2022-03-14 NOTE — Telephone Encounter (Signed)
Fax has been received and will be faxed once signed.

## 2022-03-16 DIAGNOSIS — Z992 Dependence on renal dialysis: Secondary | ICD-10-CM | POA: Diagnosis not present

## 2022-03-16 DIAGNOSIS — N186 End stage renal disease: Secondary | ICD-10-CM | POA: Diagnosis not present

## 2022-03-16 DIAGNOSIS — N2581 Secondary hyperparathyroidism of renal origin: Secondary | ICD-10-CM | POA: Diagnosis not present

## 2022-03-19 DIAGNOSIS — N186 End stage renal disease: Secondary | ICD-10-CM | POA: Diagnosis not present

## 2022-03-19 DIAGNOSIS — Z992 Dependence on renal dialysis: Secondary | ICD-10-CM | POA: Diagnosis not present

## 2022-03-19 DIAGNOSIS — N2581 Secondary hyperparathyroidism of renal origin: Secondary | ICD-10-CM | POA: Diagnosis not present

## 2022-03-21 DIAGNOSIS — Z992 Dependence on renal dialysis: Secondary | ICD-10-CM | POA: Diagnosis not present

## 2022-03-21 DIAGNOSIS — N186 End stage renal disease: Secondary | ICD-10-CM | POA: Diagnosis not present

## 2022-03-21 DIAGNOSIS — N2581 Secondary hyperparathyroidism of renal origin: Secondary | ICD-10-CM | POA: Diagnosis not present

## 2022-03-22 DIAGNOSIS — N179 Acute kidney failure, unspecified: Secondary | ICD-10-CM | POA: Diagnosis not present

## 2022-03-22 DIAGNOSIS — R351 Nocturia: Secondary | ICD-10-CM | POA: Diagnosis not present

## 2022-03-22 DIAGNOSIS — R310 Gross hematuria: Secondary | ICD-10-CM | POA: Diagnosis not present

## 2022-03-23 DIAGNOSIS — N186 End stage renal disease: Secondary | ICD-10-CM | POA: Diagnosis not present

## 2022-03-23 DIAGNOSIS — Z992 Dependence on renal dialysis: Secondary | ICD-10-CM | POA: Diagnosis not present

## 2022-03-23 DIAGNOSIS — N2581 Secondary hyperparathyroidism of renal origin: Secondary | ICD-10-CM | POA: Diagnosis not present

## 2022-03-26 DIAGNOSIS — Z992 Dependence on renal dialysis: Secondary | ICD-10-CM | POA: Diagnosis not present

## 2022-03-26 DIAGNOSIS — E1122 Type 2 diabetes mellitus with diabetic chronic kidney disease: Secondary | ICD-10-CM | POA: Diagnosis not present

## 2022-03-26 DIAGNOSIS — N2581 Secondary hyperparathyroidism of renal origin: Secondary | ICD-10-CM | POA: Diagnosis not present

## 2022-03-26 DIAGNOSIS — N186 End stage renal disease: Secondary | ICD-10-CM | POA: Diagnosis not present

## 2022-03-28 DIAGNOSIS — N2581 Secondary hyperparathyroidism of renal origin: Secondary | ICD-10-CM | POA: Diagnosis not present

## 2022-03-28 DIAGNOSIS — Z992 Dependence on renal dialysis: Secondary | ICD-10-CM | POA: Diagnosis not present

## 2022-03-28 DIAGNOSIS — N186 End stage renal disease: Secondary | ICD-10-CM | POA: Diagnosis not present

## 2022-03-30 DIAGNOSIS — N186 End stage renal disease: Secondary | ICD-10-CM | POA: Diagnosis not present

## 2022-03-30 DIAGNOSIS — Z992 Dependence on renal dialysis: Secondary | ICD-10-CM | POA: Diagnosis not present

## 2022-03-30 DIAGNOSIS — N2581 Secondary hyperparathyroidism of renal origin: Secondary | ICD-10-CM | POA: Diagnosis not present

## 2022-04-01 ENCOUNTER — Ambulatory Visit: Payer: Medicare HMO | Attending: Family Medicine | Admitting: Family Medicine

## 2022-04-01 ENCOUNTER — Encounter: Payer: Self-pay | Admitting: Family Medicine

## 2022-04-01 VITALS — BP 146/92 | HR 94 | Resp 96 | Ht 72.0 in | Wt 329.6 lb

## 2022-04-01 DIAGNOSIS — Z23 Encounter for immunization: Secondary | ICD-10-CM | POA: Diagnosis not present

## 2022-04-01 DIAGNOSIS — Z0000 Encounter for general adult medical examination without abnormal findings: Secondary | ICD-10-CM | POA: Diagnosis not present

## 2022-04-01 MED ORDER — METHOCARBAMOL 500 MG PO TABS: 500.0000 mg | ORAL_TABLET | Freq: Three times a day (TID) | ORAL | 3 refills | Status: DC | PRN

## 2022-04-01 NOTE — Progress Notes (Signed)
Subjective:   Adam Dennis is a 61 y.o. male who presents for Medicare Annual/Subsequent preventive examination.  He walks a couple of days a week for exercise. He is requesting a refill of his muscle relaxant.  Review of Systems    General: negative for fever, weight loss, appetite change Eyes: no visual symptoms. ENT: no ear symptoms, no sinus tenderness, no nasal congestion or sore throat. Neck: no pain  Respiratory: no wheezing, shortness of breath, cough Cardiovascular: no chest pain, no dyspnea on exertion, no pedal edema, no orthopnea. Gastrointestinal: no abdominal pain, no diarrhea, no constipation Genito-Urinary: no urinary frequency, no dysuria, no polyuria. Hematologic: no bruising Endocrine: no cold or heat intolerance Neurological: no headaches, no seizures, no tremors Musculoskeletal: no joint pains, no joint swelling Skin: no pruritus, no rash. Psychological: no depression, no anxiety,         Objective:    Today's Vitals   04/01/22 0841  BP: (!) 146/92  Pulse: 94  Resp: (!) 96  Weight: (!) 329 lb 9.6 oz (149.5 kg)  Height: 6' (1.829 m)   Body mass index is 44.7 kg/m.     04/01/2022    8:39 AM 01/22/2022    5:25 AM 01/21/2022    2:51 PM 01/21/2022    7:53 AM 11/10/2021    4:05 AM 02/19/2021    9:40 AM 09/01/2019    8:27 AM  Advanced Directives  Does Patient Have a Medical Advance Directive? No No No No No No No  Would patient like information on creating a medical advance directive? Yes (ED - Information included in AVS) No - Patient declined No - Patient declined No - Patient declined No - Patient declined  No - Patient declined    Current Medications (verified) Outpatient Encounter Medications as of 04/01/2022  Medication Sig   ACCU-CHEK SOFTCLIX LANCETS lancets Use as instructed to check blood sugar up to 3 times daily.   amLODipine (NORVASC) 5 MG tablet TAKE 1 TABLET(5 MG) BY MOUTH DAILY (Patient taking differently: Take 5 mg by mouth daily.)    aspirin EC 81 MG tablet Take 81 mg by mouth daily.   atorvastatin (LIPITOR) 40 MG tablet TAKE 1 TABLET(40 MG) BY MOUTH DAILY (Patient taking differently: Take 40 mg by mouth daily.)   Blood Glucose Monitoring Suppl (ACCU-CHEK AVIVA) device Use as instructed to check blood sugar up to 3 times daily.   dorzolamide-timolol (COSOPT) 22.3-6.8 MG/ML ophthalmic solution Place 1 drop into both eyes 2 (two) times daily.   Doxercalciferol (HECTOROL IV) Doxercalciferol (Hectorol)   FEROSUL 325 (65 Fe) MG tablet TAKE 1 TABLET(325 MG) BY MOUTH DAILY WITH BREAKFAST (Patient taking differently: Take 325 mg by mouth Every Tuesday,Thursday,and Saturday with dialysis.)   glucose blood (ACCU-CHEK AVIVA PLUS) test strip Use as instructed to check blood sugar up to 3 times daily.   Lancet Devices (ACCU-CHEK SOFTCLIX) lancets Use as instructed daily.   methocarbamol (ROBAXIN) 500 MG tablet Take 1 tablet (500 mg total) by mouth every 8 (eight) hours as needed for muscle spasms.   nitroGLYCERIN (NITROSTAT) 0.4 MG SL tablet PLACE 1 TABLET BY MOUTH UNDER TONGUE EVERY 5 MINUTES FOR 3 DOSES AS NEEDED FOR CHEST PAIN (Patient taking differently: Place 0.4 mg under the tongue every 5 (five) minutes as needed for chest pain.)   Semaglutide,0.25 or 0.'5MG'$ /DOS, (OZEMPIC, 0.25 OR 0.5 MG/DOSE,) 2 MG/1.5ML SOPN Inject 0.25 mg into the skin once a week.   sevelamer carbonate (RENVELA) 800 MG tablet Take 1 tablet (  800 mg total) by mouth as needed (with snacks).   TRAVATAN Z 0.004 % SOLN ophthalmic solution Place 1 drop into both eyes at bedtime.   No facility-administered encounter medications on file as of 04/01/2022.    Allergies (verified) Patient has no known allergies.   History: Past Medical History:  Diagnosis Date   Arthritis    "back" (02/06/2017)   CHF (congestive heart failure) ([place])    new onset /Encounter Date 09/12/2006   Chronic combined systolic and diastolic heart failure (HCC)    CKD (chronic kidney disease)  stage 4, GFR 15-29 ml/min (HCC)    COPD (chronic obstructive pulmonary disease) (HCC)    End stage renal disease on dialysis [facility])    Tues/ Thur/ Sat [place]   Glaucoma, right eye    High cholesterol    Hypertension    Hypertrophy of tonsils alone    Myocardial infarction ([place])    "small one; ~ 2008" (02/06/2017)   Obesity, unspecified    On home oxygen therapy    "4L; 24/7" (04/10/2017)   OSA on CPAP    Type II diabetes mellitus ([place])    Past Surgical History:  Procedure Laterality Date   AV FISTULA PLACEMENT Right 07/01/2019   Procedure: Right arm arteriovenous fistual;  Surgeon: Waynetta Sandy, MD;  Location: [place];  Service: Vascular;  Laterality: Right;   FISTULA SUPERFICIALIZATION Right 09/01/2019   Procedure: RIGHT UPPER ARM FISTULA  TRANSPOSITION OF RIGHT UPPPER ARM BRACHIAL CEPHALIC FISTULA;  Surgeon: Waynetta Sandy, MD;  Location: [place];  Service: Vascular;  Laterality: Right;   HERNIA REPAIR     INSERTION OF DIALYSIS CATHETER Right 07/01/2019   Procedure: INSERTION OF TUNNELED DIALYSIS CATHETER;  Surgeon: Waynetta Sandy, MD;  Location: [place];  Service: Vascular;  Laterality: Right;   TEE WITHOUT CARDIOVERSION N/A 04/16/2017   Procedure: TRANSESOPHAGEAL ECHOCARDIOGRAM (TEE);  Surgeon: [name], MD;  Location: [facility] ENDOSCOPY;  Service: Cardiovascular;  Laterality: N/A;   UMBILICAL HERNIA REPAIR  1960s   "when I was little baby"   Family History  Problem Relation Age of Onset   Hypertension Mother    Diabetes Sister    Colon cancer Sister    Diabetes Brother    Cancer Maternal Aunt    Amblyopia Neg Hx    Blindness Neg Hx    Cataracts Neg Hx    Glaucoma Neg Hx    Macular degeneration Neg Hx    Strabismus Neg Hx    Retinal detachment Neg Hx    Retinitis pigmentosa Neg Hx    Social History   Socioeconomic History   Marital status: Married    Spouse name: Not on file   Number of children: 0   Years of education: Not on  file   Highest education level: Not on file  Occupational History   Occupation: works as a Training and development officer  Tobacco Use   Smoking status: Never   Smokeless tobacco: Never  Scientific laboratory technician Use: Never used  Substance and Sexual Activity   Alcohol use: Yes    Comment: occasional   Drug use: No   Sexual activity: Not Currently  Other Topics Concern   Not on file  Social History Narrative   Not on file   Social Determinants of Health   Financial Resource Strain: Low Risk  (04/01/2022)   Overall Financial Resource Strain (CARDIA)    Difficulty of Paying Living Expenses: Not hard at all  Food Insecurity: No  Food Insecurity (04/01/2022)   Hunger Vital Sign    Worried About Running Out of Food in the Last Year: Never true    Ran Out of Food in the Last Year: Never true  Transportation Needs: No Transportation Needs (04/01/2022)   PRAPARE - Hydrologist (Medical): No    Lack of Transportation (Non-Medical): No  Physical Activity: Insufficiently Active (04/01/2022)   Exercise Vital Sign    Days of Exercise per Week: 1 day    Minutes of Exercise per Session: 30 min  Stress: Stress Concern Present (04/01/2022)   [place]    Feeling of Stress : To some extent  Social Connections: Moderately Isolated (04/01/2022)   Social Connection and Isolation Panel [NHANES]    Frequency of Communication with Friends and Family: More than three times a week    Frequency of Social Gatherings with Friends and Family: More than three times a week    Attends Religious Services: Never    Marine scientist or Organizations: No    Attends Music therapist: Never    Marital Status: Married    Tobacco Counseling Counseling given: Not Answered   Clinical Intake:  Pre-visit preparation completed: No  Pain : No/denies pain     Diabetes: Yes CBG done?: No  How often do you need to have someone  help you when you read instructions, pamphlets, or other written materials from your doctor or pharmacy?: 1 - Never  Diabetic?Yes  Interpreter Needed?: No      Activities of Daily Living    04/01/2022    8:45 AM 01/22/2022    5:27 AM  In your present state of health, do you have any difficulty performing the following activities:  Hearing? 0 0  Vision? 0 0  Difficulty concentrating or making decisions? 0 0  Walking or climbing stairs? 0 0  Dressing or bathing? 0 0  Doing errands, shopping? 0   Preparing Food and eating ? N   Using the Toilet? N   In the past six months, have you accidently leaked urine? N   Do you have problems with loss of bowel control? N   Managing your Medications? N   Managing your Finances? N   Housekeeping or managing your Housekeeping? N     Patient Care Team: Charlott Rakes, MD as PCP - General (Family Medicine)  Indicate any recent Medical Services you may have received from other than [facility] providers in the past year (date may be approximate).     Assessment:   This is a routine wellness examination for Shemuel.  Hearing/Vision screen No results found.  Dietary issues and exercise activities discussed:     Goals Addressed   None    Depression Screen    04/01/2022    8:39 AM 11/21/2021   10:04 AM 08/20/2021    9:27 AM 02/19/2021    9:41 AM 01/08/2021    9:10 AM 05/24/2020    8:44 AM 11/24/2019    9:25 AM  PHQ 2/9 Scores  PHQ - 2 Score 1 0 1 0 1 0 2  PHQ- 9 Score   '3  2 3 6    '$ Fall Risk    04/01/2022    8:39 AM 02/25/2022    9:26 AM 11/21/2021   10:03 AM 08/20/2021    9:27 AM 02/19/2021    9:40 AM  Fall Risk   Falls in the past year?  0 0 0 0 0  Number falls in past yr: 0 0  0 0  Injury with Fall? 0 0  0 0  Risk for fall due to : No Fall Risks No Fall Risks       FALL RISK PREVENTION PERTAINING TO THE HOME:  Any stairs in or around the home? No  If so, are there any without handrails? No  Home free of loose throw rugs in  walkways, pet beds, electrical cords, etc? No  He has oxygen cord Adequate lighting in your home to reduce risk of falls? Yes   ASSISTIVE DEVICES UTILIZED TO PREVENT FALLS:  Life alert? No  Use of a cane, walker or w/c? No  Grab bars in the bathroom? Yes  Shower chair or bench in shower? No  Elevated toilet seat or a handicapped toilet? Yes   TIMED UP AND GO:  Was the test performed? No .  Length of time to ambulate 10 feet: not performedd sec.   Gait slow and steady without use of assistive device  Cognitive Function:    04/01/2022    8:40 AM  MMSE - Mini Mental State Exam  Orientation to time 5  Orientation to Place 5  Registration 3  Attention/ Calculation 5  Recall 3  Language- name 2 objects 2  Language- repeat 1  Language- follow 3 step command 3  Language- read & follow direction 1  Write a sentence 1  Copy design 1  Total score 30        04/01/2022    8:39 AM  6CIT Screen  What Year? 0 points  What month? 0 points  What time? 0 points  Count back from 20 0 points  Months in reverse 0 points  Repeat phrase 0 points  Total Score 0 points    Immunizations Immunization History  Administered Date(s) Administered   Influenza Whole 06/02/2009   Influenza,inj,Quad PF,6+ Mos 02/15/2013, 02/10/2015, 02/07/2017, 02/25/2022   Pneumococcal Polysaccharide-23 12/13/2012   Tdap 06/26/2015   Zoster Recombinat (Shingrix) 01/08/2021, 03/12/2021   Screening Tests Health Maintenance  Topic Date Due   COVID-19 Vaccine (4 - Moderna series) 06/06/2020   FOOT EXAM  01/08/2022   Medicare Annual Wellness (AWV)  02/19/2022   HEMOGLOBIN A1C  07/24/2022   OPHTHALMOLOGY EXAM  12/13/2022   TETANUS/TDAP  06/25/2025   COLONOSCOPY (Pts 45-34yr Insurance coverage will need to be confirmed)  11/06/2027   INFLUENZA VACCINE  Completed   Hepatitis C Screening  Completed   HIV Screening  Completed   Zoster Vaccines- Shingrix  Completed   HPV VACCINES  Aged Out    Health  Maintenance  Health Maintenance Due  Topic Date Due   COVID-19 Vaccine (4 - Moderna series) 06/06/2020   FOOT EXAM  01/08/2022   Medicare Annual Wellness (AWV)  02/19/2022    Colorectal cancer screening: Type of screening: Colonoscopy. Completed 2019. Repeat every 10 years  Lung Cancer Screening: (Low Dose CT Chest recommended if Age 60-80years, 30 pack-year currently smoking OR have quit w/in 15years.) does not qualify.   Lung Cancer Screening Referral: nausea/a  Additional Screening:  Hepatitis C Screening: does not qualify; Completed yes  Vision Screening: Recommended annual ophthalmology exams for early detection of glaucoma and other disorders of the eye. Is the patient up to date with their annual eye exam?  Yes  Who is the provider or what is the name of the office in which the patient attends annual eye exams? [facility] If  pt is not established with a provider, would they like to be referred to a provider to establish care?  Not applicable .   Dental Screening: Recommended annual dental exams for proper oral hygiene  Community Resource Referral / Chronic Care Management: CRR required this visit?  No   CCM required this visit?  No      Plan:   1. Encounter for Medicare annual wellness exam Counseled on 150 minutes of exercise per week, healthy eating (including decreased daily intake of saturated fats, cholesterol, added sugars, sodium), routine healthcare maintenance.   2. Need for pneumococcal vaccine - Pneumococcal conjugate vaccine 20-valent   I have personally reviewed and noted the following in the patient's chart:   Medical and social history Use of alcohol, tobacco or illicit drugs  Current medications and supplements including opioid prescriptions. Patient is not currently taking opioid prescriptions. Functional ability and status Nutritional status Physical activity Advanced directives List of other physicians Hospitalizations, surgeries, and  ER visits in previous 12 months Vitals Screenings to include cognitive, depression, and falls Referrals and appointments  In addition, I have reviewed and discussed with patient certain preventive protocols, quality metrics, and best practice recommendations. A written personalized care plan for preventive services as well as general preventive health recommendations were provided to patient.     Charlott Rakes, MD   04/01/2022

## 2022-04-01 NOTE — Patient Instructions (Signed)
Pneumococcal Conjugate Vaccine: What You Need to Know 1. Why get vaccinated? Pneumococcal conjugate vaccine can prevent pneumococcal disease. Pneumococcal disease refers to any illness caused by pneumococcal bacteria. These bacteria can cause many types of illnesses, including pneumonia, which is an infection of the lungs. Pneumococcal bacteria are one of the most common causes of pneumonia. Besides pneumonia, pneumococcal bacteria can also cause: Ear infections Sinus infections Meningitis (infection of the tissue covering the brain and spinal cord) Bacteremia (infection of the blood) Anyone can get pneumococcal disease, but children under 44 years old, people with certain medical conditions or other risk factors, and adults 25 years or older are at the highest risk. Most pneumococcal infections are mild. However, some can result in long-term problems, such as brain damage or hearing loss. Meningitis, bacteremia, and pneumonia caused by pneumococcal disease can be fatal. 2. Pneumococcal conjugate vaccine Pneumococcal conjugate vaccine helps protect against bacteria that cause pneumococcal disease. There are three pneumococcal conjugate vaccines (PCV13, PCV15, and PCV20). The different vaccines are recommended for different people based on age and medical status. Your health care provider can help you determine which type of pneumococcal conjugate vaccine, and how many doses, you should receive. Infants and young children usually need 4 doses of pneumococcal conjugate vaccine. These doses are recommended at 2, 4, 6, and 57-35 months of age. Older children and adolescents might need pneumococcal conjugate vaccine depending on their age and medical conditions or other risk factors if they did not receive the recommended doses as infants or young children. Adults 17 through 62 years old with certain medical conditions or other risk factors who have not already received pneumococcal conjugate vaccine should  receive pneumococcal conjugate vaccine. Adults 65 years or older who have not previously received pneumococcal conjugate vaccine should receive pneumococcal conjugate vaccine. Some people with certain medical conditions are also recommended to receive pneumococcal polysaccharide vaccine (a different type of pneumococcal vaccine known as PPSV23). Some adults who have previously received a pneumococcal conjugate vaccine may be recommended to receive another pneumococcal conjugate vaccine. 3. Talk with your health care provider Tell your vaccination provider if the person getting the vaccine: Has had an allergic reaction after a previous dose of any type of pneumococcal conjugate vaccine (PCV13, PCV15, PCV20, or an earlier pneumococcal conjugate vaccine known as PCV7), or to any vaccine containing diphtheria toxoid (for example, DTaP), or has any severe, life-threatening allergies In some cases, your health care provider may decide to postpone pneumococcal conjugate vaccination until a future visit. People with minor illnesses, such as a cold, may be vaccinated. People who are moderately or severely ill should usually wait until they recover. Your health care provider can give you more information. 4. Risks of a vaccine reaction Redness, swelling, pain, or tenderness where the shot is given, and fever, loss of appetite, fussiness (irritability), feeling tired, headache, muscle aches, joint pain, and chills can happen after pneumococcal conjugate vaccination. Young children may be at increased risk for seizures caused by fever after a pneumococcal conjugate vaccine if it is administered at the same time as inactivated influenza vaccine. Ask your health care provider for more information. People sometimes faint after medical procedures, including vaccination. Tell your provider if you feel dizzy or have vision changes or ringing in the ears. As with any medicine, there is a very remote chance of a vaccine  causing a severe allergic reaction, other serious injury, or death. 5. What if there is a serious problem? An allergic reaction could occur after  the vaccinated person leaves the clinic. If you see signs of a severe allergic reaction (hives, swelling of the face and throat, difficulty breathing, a fast heartbeat, dizziness, or weakness), call 9-1-1 and get the person to the nearest hospital. For other signs that concern you, call your health care provider. Adverse reactions should be reported to the Vaccine Adverse Event Reporting System (VAERS). Your health care provider will usually file this report, or you can do it yourself. Visit the VAERS website at www.vaers.SamedayNews.es or call 458-192-4528. VAERS is only for reporting reactions, and VAERS staff members do not give medical advice. 6. The National Vaccine Injury Compensation Program The [employer] Vaccine Injury Compensation Program (VICP) is a federal program that was created to compensate people who may have been injured by certain vaccines. Claims regarding alleged injury or death due to vaccination have a time limit for filing, which may be as short as two years. Visit the VICP website at GoldCloset.com.ee or call 520-125-1379 to learn about the program and about filing a claim. 7. How can I learn more? Ask your health care provider. Call your local or state health department. Visit the website of the Food and Drug Administration (FDA) for vaccine package inserts and additional information at TraderRating.uy. Contact the Centers for Disease Control and Prevention (CDC): Call 251 639 3985 (1-800-CDC-INFO) or Visit CDC's website at http://hunter.com/. Source: CDC Vaccine Information Statement (Interim) Pneumococcal Conjugate Vaccine (10/05/2021) This same material is available at http://www.wolf.info/ for no charge. This information is not intended to replace advice given to you by your health care  provider. Make sure you discuss any questions you have with your health care provider. Document Revised: 11/23/2021 Document Reviewed: 11/23/2021 Elsevier Patient Education  [place].

## 2022-04-02 DIAGNOSIS — Z992 Dependence on renal dialysis: Secondary | ICD-10-CM | POA: Diagnosis not present

## 2022-04-02 DIAGNOSIS — N186 End stage renal disease: Secondary | ICD-10-CM | POA: Diagnosis not present

## 2022-04-02 DIAGNOSIS — N2581 Secondary hyperparathyroidism of renal origin: Secondary | ICD-10-CM | POA: Diagnosis not present

## 2022-04-04 DIAGNOSIS — Z992 Dependence on renal dialysis: Secondary | ICD-10-CM | POA: Diagnosis not present

## 2022-04-04 DIAGNOSIS — N186 End stage renal disease: Secondary | ICD-10-CM | POA: Diagnosis not present

## 2022-04-04 DIAGNOSIS — N2581 Secondary hyperparathyroidism of renal origin: Secondary | ICD-10-CM | POA: Diagnosis not present

## 2022-04-06 DIAGNOSIS — N2581 Secondary hyperparathyroidism of renal origin: Secondary | ICD-10-CM | POA: Diagnosis not present

## 2022-04-06 DIAGNOSIS — Z992 Dependence on renal dialysis: Secondary | ICD-10-CM | POA: Diagnosis not present

## 2022-04-06 DIAGNOSIS — N186 End stage renal disease: Secondary | ICD-10-CM | POA: Diagnosis not present

## 2022-04-09 DIAGNOSIS — N186 End stage renal disease: Secondary | ICD-10-CM | POA: Diagnosis not present

## 2022-04-09 DIAGNOSIS — Z992 Dependence on renal dialysis: Secondary | ICD-10-CM | POA: Diagnosis not present

## 2022-04-09 DIAGNOSIS — N2581 Secondary hyperparathyroidism of renal origin: Secondary | ICD-10-CM | POA: Diagnosis not present

## 2022-04-11 DIAGNOSIS — Z992 Dependence on renal dialysis: Secondary | ICD-10-CM | POA: Diagnosis not present

## 2022-04-11 DIAGNOSIS — N2581 Secondary hyperparathyroidism of renal origin: Secondary | ICD-10-CM | POA: Diagnosis not present

## 2022-04-11 DIAGNOSIS — N186 End stage renal disease: Secondary | ICD-10-CM | POA: Diagnosis not present

## 2022-04-13 DIAGNOSIS — N186 End stage renal disease: Secondary | ICD-10-CM | POA: Diagnosis not present

## 2022-04-13 DIAGNOSIS — N2581 Secondary hyperparathyroidism of renal origin: Secondary | ICD-10-CM | POA: Diagnosis not present

## 2022-04-13 DIAGNOSIS — Z992 Dependence on renal dialysis: Secondary | ICD-10-CM | POA: Diagnosis not present

## 2022-04-15 DIAGNOSIS — N186 End stage renal disease: Secondary | ICD-10-CM | POA: Diagnosis not present

## 2022-04-15 DIAGNOSIS — N2581 Secondary hyperparathyroidism of renal origin: Secondary | ICD-10-CM | POA: Diagnosis not present

## 2022-04-15 DIAGNOSIS — Z992 Dependence on renal dialysis: Secondary | ICD-10-CM | POA: Diagnosis not present

## 2022-04-17 DIAGNOSIS — N2581 Secondary hyperparathyroidism of renal origin: Secondary | ICD-10-CM | POA: Diagnosis not present

## 2022-04-17 DIAGNOSIS — Z992 Dependence on renal dialysis: Secondary | ICD-10-CM | POA: Diagnosis not present

## 2022-04-17 DIAGNOSIS — N186 End stage renal disease: Secondary | ICD-10-CM | POA: Diagnosis not present

## 2022-04-20 DIAGNOSIS — N186 End stage renal disease: Secondary | ICD-10-CM | POA: Diagnosis not present

## 2022-04-20 DIAGNOSIS — Z992 Dependence on renal dialysis: Secondary | ICD-10-CM | POA: Diagnosis not present

## 2022-04-20 DIAGNOSIS — N2581 Secondary hyperparathyroidism of renal origin: Secondary | ICD-10-CM | POA: Diagnosis not present

## 2022-04-23 DIAGNOSIS — N2581 Secondary hyperparathyroidism of renal origin: Secondary | ICD-10-CM | POA: Diagnosis not present

## 2022-04-23 DIAGNOSIS — Z992 Dependence on renal dialysis: Secondary | ICD-10-CM | POA: Diagnosis not present

## 2022-04-23 DIAGNOSIS — N186 End stage renal disease: Secondary | ICD-10-CM | POA: Diagnosis not present

## 2022-04-25 DIAGNOSIS — E1122 Type 2 diabetes mellitus with diabetic chronic kidney disease: Secondary | ICD-10-CM | POA: Diagnosis not present

## 2022-04-25 DIAGNOSIS — N186 End stage renal disease: Secondary | ICD-10-CM | POA: Diagnosis not present

## 2022-04-25 DIAGNOSIS — N2581 Secondary hyperparathyroidism of renal origin: Secondary | ICD-10-CM | POA: Diagnosis not present

## 2022-04-25 DIAGNOSIS — Z992 Dependence on renal dialysis: Secondary | ICD-10-CM | POA: Diagnosis not present

## 2022-04-27 DIAGNOSIS — N2581 Secondary hyperparathyroidism of renal origin: Secondary | ICD-10-CM | POA: Diagnosis not present

## 2022-04-27 DIAGNOSIS — Z992 Dependence on renal dialysis: Secondary | ICD-10-CM | POA: Diagnosis not present

## 2022-04-27 DIAGNOSIS — N186 End stage renal disease: Secondary | ICD-10-CM | POA: Diagnosis not present

## 2022-04-30 DIAGNOSIS — N2581 Secondary hyperparathyroidism of renal origin: Secondary | ICD-10-CM | POA: Diagnosis not present

## 2022-04-30 DIAGNOSIS — Z992 Dependence on renal dialysis: Secondary | ICD-10-CM | POA: Diagnosis not present

## 2022-04-30 DIAGNOSIS — N186 End stage renal disease: Secondary | ICD-10-CM | POA: Diagnosis not present

## 2022-05-01 DIAGNOSIS — R310 Gross hematuria: Secondary | ICD-10-CM | POA: Diagnosis not present

## 2022-05-01 DIAGNOSIS — R319 Hematuria, unspecified: Secondary | ICD-10-CM | POA: Diagnosis not present

## 2022-05-02 DIAGNOSIS — Z992 Dependence on renal dialysis: Secondary | ICD-10-CM | POA: Diagnosis not present

## 2022-05-02 DIAGNOSIS — N186 End stage renal disease: Secondary | ICD-10-CM | POA: Diagnosis not present

## 2022-05-02 DIAGNOSIS — N2581 Secondary hyperparathyroidism of renal origin: Secondary | ICD-10-CM | POA: Diagnosis not present

## 2022-05-04 DIAGNOSIS — N186 End stage renal disease: Secondary | ICD-10-CM | POA: Diagnosis not present

## 2022-05-04 DIAGNOSIS — N2581 Secondary hyperparathyroidism of renal origin: Secondary | ICD-10-CM | POA: Diagnosis not present

## 2022-05-04 DIAGNOSIS — Z992 Dependence on renal dialysis: Secondary | ICD-10-CM | POA: Diagnosis not present

## 2022-05-07 DIAGNOSIS — N186 End stage renal disease: Secondary | ICD-10-CM | POA: Diagnosis not present

## 2022-05-07 DIAGNOSIS — N2581 Secondary hyperparathyroidism of renal origin: Secondary | ICD-10-CM | POA: Diagnosis not present

## 2022-05-07 DIAGNOSIS — Z992 Dependence on renal dialysis: Secondary | ICD-10-CM | POA: Diagnosis not present

## 2022-05-09 DIAGNOSIS — N186 End stage renal disease: Secondary | ICD-10-CM | POA: Diagnosis not present

## 2022-05-09 DIAGNOSIS — Z992 Dependence on renal dialysis: Secondary | ICD-10-CM | POA: Diagnosis not present

## 2022-05-09 DIAGNOSIS — N2581 Secondary hyperparathyroidism of renal origin: Secondary | ICD-10-CM | POA: Diagnosis not present

## 2022-05-11 DIAGNOSIS — N186 End stage renal disease: Secondary | ICD-10-CM | POA: Diagnosis not present

## 2022-05-11 DIAGNOSIS — Z992 Dependence on renal dialysis: Secondary | ICD-10-CM | POA: Diagnosis not present

## 2022-05-11 DIAGNOSIS — N2581 Secondary hyperparathyroidism of renal origin: Secondary | ICD-10-CM | POA: Diagnosis not present

## 2022-05-14 DIAGNOSIS — N186 End stage renal disease: Secondary | ICD-10-CM | POA: Diagnosis not present

## 2022-05-14 DIAGNOSIS — N2581 Secondary hyperparathyroidism of renal origin: Secondary | ICD-10-CM | POA: Diagnosis not present

## 2022-05-14 DIAGNOSIS — Z992 Dependence on renal dialysis: Secondary | ICD-10-CM | POA: Diagnosis not present

## 2022-05-16 DIAGNOSIS — Z992 Dependence on renal dialysis: Secondary | ICD-10-CM | POA: Diagnosis not present

## 2022-05-16 DIAGNOSIS — N186 End stage renal disease: Secondary | ICD-10-CM | POA: Diagnosis not present

## 2022-05-16 DIAGNOSIS — N2581 Secondary hyperparathyroidism of renal origin: Secondary | ICD-10-CM | POA: Diagnosis not present

## 2022-05-18 DIAGNOSIS — N2581 Secondary hyperparathyroidism of renal origin: Secondary | ICD-10-CM | POA: Diagnosis not present

## 2022-05-18 DIAGNOSIS — N186 End stage renal disease: Secondary | ICD-10-CM | POA: Diagnosis not present

## 2022-05-18 DIAGNOSIS — Z992 Dependence on renal dialysis: Secondary | ICD-10-CM | POA: Diagnosis not present

## 2022-05-21 DIAGNOSIS — Z992 Dependence on renal dialysis: Secondary | ICD-10-CM | POA: Diagnosis not present

## 2022-05-21 DIAGNOSIS — N186 End stage renal disease: Secondary | ICD-10-CM | POA: Diagnosis not present

## 2022-05-21 DIAGNOSIS — N2581 Secondary hyperparathyroidism of renal origin: Secondary | ICD-10-CM | POA: Diagnosis not present

## 2022-05-23 ENCOUNTER — Telehealth: Payer: Self-pay | Admitting: Family Medicine

## 2022-05-23 DIAGNOSIS — N186 End stage renal disease: Secondary | ICD-10-CM | POA: Diagnosis not present

## 2022-05-23 DIAGNOSIS — Z992 Dependence on renal dialysis: Secondary | ICD-10-CM | POA: Diagnosis not present

## 2022-05-23 DIAGNOSIS — E1122 Type 2 diabetes mellitus with diabetic chronic kidney disease: Secondary | ICD-10-CM

## 2022-05-23 DIAGNOSIS — N2581 Secondary hyperparathyroidism of renal origin: Secondary | ICD-10-CM | POA: Diagnosis not present

## 2022-05-23 MED ORDER — OZEMPIC (0.25 OR 0.5 MG/DOSE) 2 MG/1.5ML ~~LOC~~ SOPN: 0.5000 mg | PEN_INJECTOR | SUBCUTANEOUS | 6 refills | Status: DC

## 2022-05-23 NOTE — Telephone Encounter (Signed)
Pt needs refills for the increased dose of Semaglutide,OZEMPIC     [facility] [phone] - [place], [place]

## 2022-05-23 NOTE — Telephone Encounter (Signed)
Refills for Ozempic sent.

## 2022-05-25 DIAGNOSIS — Z992 Dependence on renal dialysis: Secondary | ICD-10-CM | POA: Diagnosis not present

## 2022-05-25 DIAGNOSIS — N2581 Secondary hyperparathyroidism of renal origin: Secondary | ICD-10-CM | POA: Diagnosis not present

## 2022-05-25 DIAGNOSIS — N186 End stage renal disease: Secondary | ICD-10-CM | POA: Diagnosis not present

## 2022-05-26 DIAGNOSIS — E1122 Type 2 diabetes mellitus with diabetic chronic kidney disease: Secondary | ICD-10-CM | POA: Diagnosis not present

## 2022-05-26 DIAGNOSIS — Z992 Dependence on renal dialysis: Secondary | ICD-10-CM | POA: Diagnosis not present

## 2022-05-26 DIAGNOSIS — N186 End stage renal disease: Secondary | ICD-10-CM | POA: Diagnosis not present

## 2022-05-28 DIAGNOSIS — Z992 Dependence on renal dialysis: Secondary | ICD-10-CM | POA: Diagnosis not present

## 2022-05-28 DIAGNOSIS — N186 End stage renal disease: Secondary | ICD-10-CM | POA: Diagnosis not present

## 2022-05-28 DIAGNOSIS — N2581 Secondary hyperparathyroidism of renal origin: Secondary | ICD-10-CM | POA: Diagnosis not present

## 2022-05-30 DIAGNOSIS — Z992 Dependence on renal dialysis: Secondary | ICD-10-CM | POA: Diagnosis not present

## 2022-05-30 DIAGNOSIS — N186 End stage renal disease: Secondary | ICD-10-CM | POA: Diagnosis not present

## 2022-05-30 DIAGNOSIS — N2581 Secondary hyperparathyroidism of renal origin: Secondary | ICD-10-CM | POA: Diagnosis not present

## 2022-06-01 DIAGNOSIS — Z992 Dependence on renal dialysis: Secondary | ICD-10-CM | POA: Diagnosis not present

## 2022-06-01 DIAGNOSIS — N186 End stage renal disease: Secondary | ICD-10-CM | POA: Diagnosis not present

## 2022-06-01 DIAGNOSIS — N2581 Secondary hyperparathyroidism of renal origin: Secondary | ICD-10-CM | POA: Diagnosis not present

## 2022-06-04 ENCOUNTER — Other Ambulatory Visit: Payer: Self-pay | Admitting: Family Medicine

## 2022-06-04 DIAGNOSIS — N186 End stage renal disease: Secondary | ICD-10-CM | POA: Diagnosis not present

## 2022-06-04 DIAGNOSIS — I10 Essential (primary) hypertension: Secondary | ICD-10-CM

## 2022-06-04 DIAGNOSIS — Z992 Dependence on renal dialysis: Secondary | ICD-10-CM | POA: Diagnosis not present

## 2022-06-04 DIAGNOSIS — E1169 Type 2 diabetes mellitus with other specified complication: Secondary | ICD-10-CM

## 2022-06-04 DIAGNOSIS — N2581 Secondary hyperparathyroidism of renal origin: Secondary | ICD-10-CM | POA: Diagnosis not present

## 2022-06-04 NOTE — Telephone Encounter (Signed)
Future visit in 2 months.  Requested Prescriptions  Pending Prescriptions Disp Refills   amLODipine (NORVASC) 5 MG tablet [Pharmacy Med Name: AMLODIPINE BESYLATE '5MG'$  TABLETS] 90 tablet 0    Sig: TAKE 1 TABLET(5 MG) BY MOUTH DAILY     Cardiovascular: Calcium Channel Blockers 2 Failed - 06/04/2022  6:37 AM      Failed - Last BP in normal range    BP Readings from Last 1 Encounters:  04/01/22 (!) 146/92         Passed - Last Heart Rate in normal range    Pulse Readings from Last 1 Encounters:  04/01/22 94         Passed - Valid encounter within last 6 months    Recent Outpatient Visits           2 months ago Encounter for [employer] annual wellness exam   [place], [place], MD   3 months ago Other emphysema [facility])   [place], Charlane Ferretti, MD   6 months ago Hospital discharge follow-up   [place] Kerin Perna, NP   9 months ago Type 2 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, without long-term current use of insulin ([place])   [facility] [place], Charlane Ferretti, MD   1 year ago Encounter for [employer] annual wellness exam   [place], Charlane Ferretti, MD       Future Appointments             In 3 weeks [place], Bonna Gains, MD [facility]   In 2 months Charlott Rakes, MD [place]   In 3 months Charlott Rakes, MD [place]             atorvastatin (LIPITOR) 40 MG tablet [Pharmacy Med Name: ATORVASTATIN '40MG'$  TABLETS] 90 tablet 1    Sig: TAKE 1 TABLET(40 MG) BY MOUTH DAILY     Cardiovascular:  Antilipid - Statins Failed - 06/04/2022  6:37 AM      Failed - Lipid Panel in normal range within the last 12 months    Cholesterol, Total  Date Value Ref Range Status  08/20/2021 146 100 - 199 mg/dL Final   Cholesterol  Date  Value Ref Range Status  11/11/2021 136 0 - 200 mg/dL Final   LDL Chol Calc (NIH)  Date Value Ref Range Status  08/20/2021 72 0 - 99 mg/dL Final   LDL Cholesterol  Date Value Ref Range Status  11/11/2021 49 0 - 99 mg/dL Final    Comment:           Total Cholesterol/HDL:CHD Risk Coronary Heart Disease Risk Table                     Men   Women  1/2 Average Risk   3.4   3.3  Average Risk       5.0   4.4  2 X Average Risk   9.6   7.1  3 X Average Risk  23.4   11.0        Use the calculated Patient Ratio above and the CHD Risk Table to determine the patient's CHD Risk.        ATP III CLASSIFICATION (LDL):  <100     mg/dL   Optimal  100-129  mg/dL   Near or  Above                    Optimal  130-159  mg/dL   Borderline  160-189  mg/dL   High  >190     mg/dL   Very High Performed at [facility] [address], [place], [place] [address]    HDL  Date Value Ref Range Status  11/11/2021 66 >40 mg/dL Final  08/20/2021 54 >39 mg/dL Final   Triglycerides  Date Value Ref Range Status  11/11/2021 104 <150 mg/dL Final         Passed - Patient is not pregnant      Passed - Valid encounter within last 12 months    Recent Outpatient Visits           2 months ago Encounter for [employer] annual wellness exam   [place], Charlane Ferretti, MD   3 months ago Other emphysema [facility])   [place] Charlott Rakes, MD   6 months ago Hospital discharge follow-up   [place] Kerin Perna, NP   9 months ago Type 2 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, without long-term current use of insulin ([place])   [facility] [place], Charlane Ferretti, MD   1 year ago Encounter for [employer] annual wellness exam   [place], [place], MD       Future Appointments             In 3 weeks Hunsucker, Bonna Gains, MD  [facility]   In 2 months Charlott Rakes, MD [place]   In 3 months Charlott Rakes, MD [place]

## 2022-06-05 DIAGNOSIS — R350 Frequency of micturition: Secondary | ICD-10-CM | POA: Diagnosis not present

## 2022-06-05 DIAGNOSIS — R310 Gross hematuria: Secondary | ICD-10-CM | POA: Diagnosis not present

## 2022-06-06 ENCOUNTER — Telehealth: Payer: Self-pay

## 2022-06-06 DIAGNOSIS — Z992 Dependence on renal dialysis: Secondary | ICD-10-CM | POA: Diagnosis not present

## 2022-06-06 DIAGNOSIS — N186 End stage renal disease: Secondary | ICD-10-CM | POA: Diagnosis not present

## 2022-06-06 DIAGNOSIS — N2581 Secondary hyperparathyroidism of renal origin: Secondary | ICD-10-CM | POA: Diagnosis not present

## 2022-06-06 NOTE — Patient Outreach (Signed)
    Care Coordination Follow Up  06/06/2022 Name:  Adam Dennis MRN:  [id number] DOB:  1961-02-23  Subjective: Adam Dennis is a 62 y.o. year old male who is a primary care patient of Charlott Rakes, MD   Patient contacted 24 Nurse Call Line on 06/05/22 with complaints of "runny nose and sore throat for one week."    I reached out by phone to follow up on the alert and spoke to Patient. He voices that he is feeling better. His runny nose seems to almost be completely gone. He reports that his throat is feeling much better. He has been doing the warm salt water gargles several times a day and feels like it is helping. He has not needed to take any meds as he voices symptoms have improved. He is aware of s/s of worsening condition and when to seek medical attention. He denies any further RN CM needs or concerns at this time. Patient aware that he can call 24hr Nurse Line back if needed.   Care Coordination Interventions:  Yes, provided   Follow up plan: No further intervention required.   Encounter Outcome:  Pt. Visit Completed    Enzo Montgomery, RN,BSN,CCM [employer] Telephonic Care Management Coordinator Direct Phone: [phone] Toll Free: [phone] Fax: [phone]

## 2022-06-08 DIAGNOSIS — Z992 Dependence on renal dialysis: Secondary | ICD-10-CM | POA: Diagnosis not present

## 2022-06-08 DIAGNOSIS — N2581 Secondary hyperparathyroidism of renal origin: Secondary | ICD-10-CM | POA: Diagnosis not present

## 2022-06-08 DIAGNOSIS — N186 End stage renal disease: Secondary | ICD-10-CM | POA: Diagnosis not present

## 2022-06-11 DIAGNOSIS — N2581 Secondary hyperparathyroidism of renal origin: Secondary | ICD-10-CM | POA: Diagnosis not present

## 2022-06-11 DIAGNOSIS — Z992 Dependence on renal dialysis: Secondary | ICD-10-CM | POA: Diagnosis not present

## 2022-06-11 DIAGNOSIS — N186 End stage renal disease: Secondary | ICD-10-CM | POA: Diagnosis not present

## 2022-06-13 DIAGNOSIS — Z992 Dependence on renal dialysis: Secondary | ICD-10-CM | POA: Diagnosis not present

## 2022-06-13 DIAGNOSIS — N2581 Secondary hyperparathyroidism of renal origin: Secondary | ICD-10-CM | POA: Diagnosis not present

## 2022-06-13 DIAGNOSIS — N186 End stage renal disease: Secondary | ICD-10-CM | POA: Diagnosis not present

## 2022-06-15 DIAGNOSIS — N2581 Secondary hyperparathyroidism of renal origin: Secondary | ICD-10-CM | POA: Diagnosis not present

## 2022-06-15 DIAGNOSIS — Z992 Dependence on renal dialysis: Secondary | ICD-10-CM | POA: Diagnosis not present

## 2022-06-15 DIAGNOSIS — N186 End stage renal disease: Secondary | ICD-10-CM | POA: Diagnosis not present

## 2022-06-18 DIAGNOSIS — N2581 Secondary hyperparathyroidism of renal origin: Secondary | ICD-10-CM | POA: Diagnosis not present

## 2022-06-18 DIAGNOSIS — N186 End stage renal disease: Secondary | ICD-10-CM | POA: Diagnosis not present

## 2022-06-18 DIAGNOSIS — Z992 Dependence on renal dialysis: Secondary | ICD-10-CM | POA: Diagnosis not present

## 2022-06-20 DIAGNOSIS — Z992 Dependence on renal dialysis: Secondary | ICD-10-CM | POA: Diagnosis not present

## 2022-06-20 DIAGNOSIS — N2581 Secondary hyperparathyroidism of renal origin: Secondary | ICD-10-CM | POA: Diagnosis not present

## 2022-06-20 DIAGNOSIS — N186 End stage renal disease: Secondary | ICD-10-CM | POA: Diagnosis not present

## 2022-06-22 DIAGNOSIS — N186 End stage renal disease: Secondary | ICD-10-CM | POA: Diagnosis not present

## 2022-06-22 DIAGNOSIS — Z992 Dependence on renal dialysis: Secondary | ICD-10-CM | POA: Diagnosis not present

## 2022-06-22 DIAGNOSIS — N2581 Secondary hyperparathyroidism of renal origin: Secondary | ICD-10-CM | POA: Diagnosis not present

## 2022-06-24 DIAGNOSIS — N186 End stage renal disease: Secondary | ICD-10-CM | POA: Diagnosis not present

## 2022-06-24 DIAGNOSIS — E785 Hyperlipidemia, unspecified: Secondary | ICD-10-CM | POA: Diagnosis not present

## 2022-06-24 DIAGNOSIS — I5032 Chronic diastolic (congestive) heart failure: Secondary | ICD-10-CM | POA: Diagnosis not present

## 2022-06-24 DIAGNOSIS — I10 Essential (primary) hypertension: Secondary | ICD-10-CM | POA: Diagnosis not present

## 2022-06-25 DIAGNOSIS — N2581 Secondary hyperparathyroidism of renal origin: Secondary | ICD-10-CM | POA: Diagnosis not present

## 2022-06-25 DIAGNOSIS — Z992 Dependence on renal dialysis: Secondary | ICD-10-CM | POA: Diagnosis not present

## 2022-06-25 DIAGNOSIS — N186 End stage renal disease: Secondary | ICD-10-CM | POA: Diagnosis not present

## 2022-06-26 ENCOUNTER — Encounter: Payer: Self-pay | Admitting: Pulmonary Disease

## 2022-06-26 ENCOUNTER — Ambulatory Visit (INDEPENDENT_AMBULATORY_CARE_PROVIDER_SITE_OTHER): Payer: Medicare HMO | Admitting: Pulmonary Disease

## 2022-06-26 VITALS — BP 136/70 | HR 99 | Temp 98.8°F | Wt 324.6 lb

## 2022-06-26 DIAGNOSIS — J9611 Chronic respiratory failure with hypoxia: Secondary | ICD-10-CM

## 2022-06-26 DIAGNOSIS — J9612 Chronic respiratory failure with hypercapnia: Secondary | ICD-10-CM | POA: Diagnosis not present

## 2022-06-26 DIAGNOSIS — G4733 Obstructive sleep apnea (adult) (pediatric): Secondary | ICD-10-CM | POA: Diagnosis not present

## 2022-06-26 DIAGNOSIS — Z992 Dependence on renal dialysis: Secondary | ICD-10-CM | POA: Diagnosis not present

## 2022-06-26 DIAGNOSIS — E1122 Type 2 diabetes mellitus with diabetic chronic kidney disease: Secondary | ICD-10-CM | POA: Diagnosis not present

## 2022-06-26 DIAGNOSIS — N186 End stage renal disease: Secondary | ICD-10-CM | POA: Diagnosis not present

## 2022-06-26 NOTE — Progress Notes (Signed)
$'@Patient'E$  ID: Adam Dennis, male    DOB: Jan 21, 1961, 62 y.o.   MRN: [id number]  Chief Complaint  Patient presents with   Follow-up    Pt is here for follow up for chronic resp failure. Pt is still on POC and not having any isuses noted at the moment. Pt states he is doing well. No inhalers noted for patient at this time.     Referring provider: Charlott Rakes, MD  HPI:   62 y.o. man whom are seeing in follow-up for chronic hypoxemic respiratory failure and OHS/OSA. Most recent PCP note reviewed.  Overall doing well.  Oxygen saturation doing well.  Adherent to 4 L pulsed via POC.  No issues.  Reports adherence to NIPPV at night.  Weight is stable.  This is good news. Actually, down just a little bit.  A few pounds.  HPI at initial visit: Patient states he needed oxygen in the past.  Had occurred.  Time that went away.  Unclear why this was, whether he no longer needed or just no longer had access to it.  He was hospitalized in late 12/2021 for respiratory failure.  There was concern for COPD exacerbation.  He was given antibiotics and steroids.  Notably, he has no history of COPD.  PFTs in 2018 that did not demonstrate fixed obstruction.  He had a CTA PE protocol performed at that time that on my review and interpretation reveals clear lungs although motion artifact degrades and inhibits full interpretation, no evidence of PE.  He had a CTA PE protocol performed 10/2021 in addition, prior to hospitalization, but on my review interpretation reveals clear lungs bilaterally, no evidence of emphysema or interstitial abnormality, no edema or pleural effusion, no PE.  Review of labs indicate mildly elevated bicarbonate.  He does carry a diagnosis of OSA based on polysomnography.  He was initially on CPAP.  Simply transition to trilogy ventilator.  He reports good adherence to NIPPV at night when he sleeps.  He has shortness of breath.  It comes and goes.  Worse when he is not wearing his oxygen.   When he wears his oxygen he does okay.  At rest he has no issues.  No time of day when things are better or worse.  No position that makes things better or worse.  No seasonal environmental factors he can identify that makes it better or worse.  He has used several different inhalers in the past.  None of them helped his symptoms of dyspnea.  No other alleviating or exacerbating factors.  Most recent TTE 10/2021 that on my review indicates normal RV size, normal RV function, normal RA size, normal estimated right-sided pressures.  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, OSA on trilogy ventilator Surgical history: Reviewed, he denies any other he has had a placement of AV fistula  Family history: Mother with hypertension, sister with diabetes, brother with diabetes Social history: Never smoker, lives in [place], used to be a Training and development officer at [employer] Family History  Problem Relation Age of Onset   Hypertension Mother    Diabetes Sister    Colon cancer Sister    Diabetes Brother    Cancer Maternal Aunt    Amblyopia Neg Hx    Blindness Neg Hx    Cataracts Neg Hx    Glaucoma Neg Hx    Macular degeneration Neg Hx    Strabismus Neg Hx    Retinal detachment Neg Hx  Retinitis pigmentosa Neg Hx        Questionaires / Pulmonary Flowsheets:   ACT:      No data to display           MMRC:     No data to display           Epworth:      No data to display           Tests:   FENO:  No results found for: "NITRICOXIDE"  PFT:    Latest Ref Rng & Units 02/19/2017    9:25 AM  PFT Results  FVC-Pre L 1.17   FVC-Predicted Pre % 28   FVC-Post L 1.21   FVC-Predicted Post % 29   Pre FEV1/FVC % % 85   Post FEV1/FCV % % 84   FEV1-Pre L 1.00   FEV1-Predicted Pre % 30   FEV1-Post L 1.03   DLCO uncorrected ml/min/mmHg 11.58   DLCO UNC% % 35   DLCO corrected ml/min/mmHg 12.87   DLCO COR %Predicted % 39   DLVA  Predicted % 119   TLC L 3.02   TLC % Predicted % 43   RV % Predicted % 83   Personally reviewed interpreted as no fixed obstruction, no bronchodilator response, severely reduced TLC, severe restriction  WALK:      No data to display           Imaging: Personally reviewed and as per EMR discussion this note No results found.  Lab Results: Personally reviewed CBC    Component Value Date/Time   WBC 5.5 01/26/2022 0802   RBC 4.28 01/26/2022 0802   HGB 11.5 (L) 01/26/2022 0802   HGB 12.8 (L) 03/24/2019 1017   HCT 38.0 (L) 01/26/2022 0802   HCT 39.7 03/24/2019 1017   PLT 257 01/26/2022 0802   PLT 191 03/24/2019 1017   MCV 88.8 01/26/2022 0802   MCV 87 03/24/2019 1017   MCH 26.9 01/26/2022 0802   MCHC 30.3 01/26/2022 0802   RDW 17.5 (H) 01/26/2022 0802   RDW 13.9 03/24/2019 1017   LYMPHSABS 1.0 01/21/2022 0845   LYMPHSABS 1.3 03/24/2019 1017   MONOABS 0.6 01/21/2022 0845   EOSABS 0.1 01/21/2022 0845   EOSABS 0.0 03/24/2019 1017   BASOSABS 0.0 01/21/2022 0845   BASOSABS 0.0 03/24/2019 1017    BMET    Component Value Date/Time   NA 137 01/26/2022 0802   NA 141 03/24/2019 1017   K 4.5 01/26/2022 0802   CL 98 01/26/2022 0802   CO2 29 01/26/2022 0802   GLUCOSE 110 (H) 01/26/2022 0802   BUN 48 (H) 01/26/2022 0802   BUN 36 (H) 03/24/2019 1017   CREATININE 9.99 (H) 01/26/2022 0802   CREATININE 2.89 (H) 11/30/2015 1250   CALCIUM 9.4 01/26/2022 0802   GFRNONAA 5 (L) 01/26/2022 0802   GFRNONAA 24 (L) 11/30/2015 1250   GFRAA 8 (L) 07/06/2019 0754   GFRAA 27 (L) 11/30/2015 1250    BNP    Component Value Date/Time   BNP 142.6 (H) 01/23/2022 1134    ProBNP    Component Value Date/Time   PROBNP 227.1 (H) 11/12/2013 0953    Specialty Problems       Pulmonary Problems   HYPERTROPHY, TONSILS    Qualifier: Diagnosis of  By: Annamaria Boots MD, Clinton D       Chronic respiratory failure ([place])    No Known Allergies  Immunization History  Administered Date(s)  Administered   Influenza Whole  06/02/2009   Influenza,inj,Quad PF,6+ Mos 02/15/2013, 02/10/2015, 02/07/2017, 02/25/2022   PNEUMOCOCCAL CONJUGATE-20 04/01/2022   Pneumococcal Polysaccharide-23 12/13/2012   Tdap 06/26/2015   Zoster Recombinat (Shingrix) 01/08/2021, 03/12/2021    Past Medical History:  Diagnosis Date   Arthritis    "back" (02/06/2017)   CHF (congestive heart failure) ([place])    new onset /Encounter Date 09/12/2006   Chronic combined systolic and diastolic heart failure (HCC)    CKD (chronic kidney disease) stage 4, GFR 15-29 ml/min (HCC)    COPD (chronic obstructive pulmonary disease) ([place])    End stage renal disease on dialysis [facility])    Tues/ Thur/ Sat [place]   Glaucoma, right eye    High cholesterol    Hypertension    Hypertrophy of tonsils alone    Myocardial infarction ([place])    "small one; ~ 2008" (02/06/2017)   Obesity, unspecified    On home oxygen therapy    "4L; 24/7" (04/10/2017)   OSA on CPAP    Type II diabetes mellitus (HCC)     Tobacco History: Social History   Tobacco Use  Smoking Status Never  Smokeless Tobacco Never   Counseling given: Not Answered   Continue to not smoke  Outpatient Encounter Medications as of 06/26/2022  Medication Sig   ACCU-CHEK SOFTCLIX LANCETS lancets Use as instructed to check blood sugar up to 3 times daily.   amLODipine (NORVASC) 5 MG tablet TAKE 1 TABLET(5 MG) BY MOUTH DAILY   aspirin EC 81 MG tablet Take 81 mg by mouth daily.   atorvastatin (LIPITOR) 40 MG tablet TAKE 1 TABLET(40 MG) BY MOUTH DAILY   Blood Glucose Monitoring Suppl (ACCU-CHEK AVIVA) device Use as instructed to check blood sugar up to 3 times daily.   dorzolamide-timolol (COSOPT) 22.3-6.8 MG/ML ophthalmic solution Place 1 drop into both eyes 2 (two) times daily.   Doxercalciferol (HECTOROL IV) Doxercalciferol (Hectorol)   FEROSUL 325 (65 Fe) MG tablet TAKE 1 TABLET(325 MG) BY MOUTH DAILY WITH BREAKFAST (Patient taking differently: Take  325 mg by mouth Every Tuesday,Thursday,and Saturday with dialysis.)   glucose blood (ACCU-CHEK AVIVA PLUS) test strip Use as instructed to check blood sugar up to 3 times daily.   Lancet Devices (ACCU-CHEK SOFTCLIX) lancets Use as instructed daily.   methocarbamol (ROBAXIN) 500 MG tablet Take 1 tablet (500 mg total) by mouth every 8 (eight) hours as needed for muscle spasms.   nitroGLYCERIN (NITROSTAT) 0.4 MG SL tablet PLACE 1 TABLET BY MOUTH UNDER TONGUE EVERY 5 MINUTES FOR 3 DOSES AS NEEDED FOR CHEST PAIN (Patient taking differently: Place 0.4 mg under the tongue every 5 (five) minutes as needed for chest pain.)   Semaglutide,0.25 or 0.'5MG'$ /DOS, (OZEMPIC, 0.25 OR 0.5 MG/DOSE,) 2 MG/1.5ML SOPN Inject 0.5 mg into the skin once a week.   sevelamer carbonate (RENVELA) 800 MG tablet Take 1 tablet (800 mg total) by mouth as needed (with snacks).   TRAVATAN Z 0.004 % SOLN ophthalmic solution Place 1 drop into both eyes at bedtime.   No facility-administered encounter medications on file as of 06/26/2022.     Review of Systems  Review of Systems  N/a Physical Exam  BP 136/70 (BP Location: Left Arm, Patient Position: Sitting, Cuff Size: Normal)   Pulse 99   Temp 98.8 F (37.1 C) (Oral)   Wt (!) 324 lb 9.6 oz (147.2 kg)   SpO2 97%   BMI 44.02 kg/m   Wt Readings from Last 5 Encounters:  06/26/22 (!) 324 lb 9.6 oz (  147.2 kg)  04/01/22 (!) 329 lb 9.6 oz (149.5 kg)  03/06/22 (!) 372 lb 3.2 oz (168.8 kg)  02/25/22 (!) 328 lb 9.6 oz (149.1 kg)  01/26/22 (!) 323 lb 13.7 oz (146.9 kg)    BMI Readings from Last 5 Encounters:  06/26/22 44.02 kg/m  04/01/22 44.70 kg/m  03/06/22 50.48 kg/m  02/25/22 44.57 kg/m  01/26/22 43.92 kg/m     Physical Exam General: Sitting in chair, no acute distress Eyes: EOMI, no icterus Neck: Supple, no JVP Pulmonary: Distant, clear, normal work of breathing Cardiovascular: Warm, no edema Abdomen: Distended, bowel sounds present MSK: No synovitis, no  joint effusion Neuro: Normal gait, no weakness Psych: Normal mood, full affect   Assessment & Plan:   Chronic hypoxemic respiratory failure: Suspect largely driven by OHS/OSA given his habitus as well as PFTs 2018 with severe restriction and severely reduced ERV.  There is no evidence of COPD on his prior PFTs, he is a never smoker, I do not think his exacerbation for breathing related to COPD.  Possibly has underlying asthma although he denies significant atopic symptoms or history of the same.  He has used multiple inhalers in the past without any significant positive effect on his shortness of breath.  It is possible he has intermittent volume overload between dialysis sessions although recent CT scan does not show any sign of pulmonary edema or pleural effusion.  Fortunately, recent TTE 10/2021 reveals normal RV size and function, normal RA size, normal estimated RVSP.  Recommended weight loss.  OSA on NIPPV: Previously on CPAP.  Now using trilogy ventilator BiPAP versus AVAPS, unclear based on documentation in the chart in his description.  Recommend strict adherence to NIPPV at night or anytime he sleeping.  Counseled on the importance of helping with fluid retention as well as mitigating or decreasing risk of pulmonary hypertension.   Return in about 1 year (around 06/27/2023).   Lanier Clam, MD 06/26/2022

## 2022-06-26 NOTE — Patient Instructions (Signed)
Nice to see you again  I am glad you are doing well  Continue using the mask and breathe machine at night  Continue using oxygen, please check it while moving around, sounds like is doing well at rest  Return to clinic in 1 year or sooner as needed with Dr. Silas Flood

## 2022-06-27 DIAGNOSIS — N186 End stage renal disease: Secondary | ICD-10-CM | POA: Diagnosis not present

## 2022-06-27 DIAGNOSIS — N2581 Secondary hyperparathyroidism of renal origin: Secondary | ICD-10-CM | POA: Diagnosis not present

## 2022-06-27 DIAGNOSIS — Z992 Dependence on renal dialysis: Secondary | ICD-10-CM | POA: Diagnosis not present

## 2022-06-29 DIAGNOSIS — Z992 Dependence on renal dialysis: Secondary | ICD-10-CM | POA: Diagnosis not present

## 2022-06-29 DIAGNOSIS — N186 End stage renal disease: Secondary | ICD-10-CM | POA: Diagnosis not present

## 2022-06-29 DIAGNOSIS — N2581 Secondary hyperparathyroidism of renal origin: Secondary | ICD-10-CM | POA: Diagnosis not present

## 2022-07-02 DIAGNOSIS — Z992 Dependence on renal dialysis: Secondary | ICD-10-CM | POA: Diagnosis not present

## 2022-07-02 DIAGNOSIS — N186 End stage renal disease: Secondary | ICD-10-CM | POA: Diagnosis not present

## 2022-07-02 DIAGNOSIS — N2581 Secondary hyperparathyroidism of renal origin: Secondary | ICD-10-CM | POA: Diagnosis not present

## 2022-07-04 DIAGNOSIS — Z992 Dependence on renal dialysis: Secondary | ICD-10-CM | POA: Diagnosis not present

## 2022-07-04 DIAGNOSIS — N2581 Secondary hyperparathyroidism of renal origin: Secondary | ICD-10-CM | POA: Diagnosis not present

## 2022-07-04 DIAGNOSIS — N186 End stage renal disease: Secondary | ICD-10-CM | POA: Diagnosis not present

## 2022-07-06 DIAGNOSIS — N186 End stage renal disease: Secondary | ICD-10-CM | POA: Diagnosis not present

## 2022-07-06 DIAGNOSIS — Z992 Dependence on renal dialysis: Secondary | ICD-10-CM | POA: Diagnosis not present

## 2022-07-06 DIAGNOSIS — N2581 Secondary hyperparathyroidism of renal origin: Secondary | ICD-10-CM | POA: Diagnosis not present

## 2022-07-09 DIAGNOSIS — N186 End stage renal disease: Secondary | ICD-10-CM | POA: Diagnosis not present

## 2022-07-09 DIAGNOSIS — N2581 Secondary hyperparathyroidism of renal origin: Secondary | ICD-10-CM | POA: Diagnosis not present

## 2022-07-09 DIAGNOSIS — Z992 Dependence on renal dialysis: Secondary | ICD-10-CM | POA: Diagnosis not present

## 2022-07-11 DIAGNOSIS — Z992 Dependence on renal dialysis: Secondary | ICD-10-CM | POA: Diagnosis not present

## 2022-07-11 DIAGNOSIS — N2581 Secondary hyperparathyroidism of renal origin: Secondary | ICD-10-CM | POA: Diagnosis not present

## 2022-07-11 DIAGNOSIS — N186 End stage renal disease: Secondary | ICD-10-CM | POA: Diagnosis not present

## 2022-07-13 DIAGNOSIS — Z992 Dependence on renal dialysis: Secondary | ICD-10-CM | POA: Diagnosis not present

## 2022-07-13 DIAGNOSIS — N2581 Secondary hyperparathyroidism of renal origin: Secondary | ICD-10-CM | POA: Diagnosis not present

## 2022-07-13 DIAGNOSIS — N186 End stage renal disease: Secondary | ICD-10-CM | POA: Diagnosis not present

## 2022-07-15 DIAGNOSIS — H401123 Primary open-angle glaucoma, left eye, severe stage: Secondary | ICD-10-CM | POA: Diagnosis not present

## 2022-07-15 DIAGNOSIS — H40021 Open angle with borderline findings, high risk, right eye: Secondary | ICD-10-CM | POA: Diagnosis not present

## 2022-07-16 DIAGNOSIS — N2581 Secondary hyperparathyroidism of renal origin: Secondary | ICD-10-CM | POA: Diagnosis not present

## 2022-07-16 DIAGNOSIS — N186 End stage renal disease: Secondary | ICD-10-CM | POA: Diagnosis not present

## 2022-07-16 DIAGNOSIS — Z992 Dependence on renal dialysis: Secondary | ICD-10-CM | POA: Diagnosis not present

## 2022-07-18 DIAGNOSIS — Z992 Dependence on renal dialysis: Secondary | ICD-10-CM | POA: Diagnosis not present

## 2022-07-18 DIAGNOSIS — N186 End stage renal disease: Secondary | ICD-10-CM | POA: Diagnosis not present

## 2022-07-18 DIAGNOSIS — N2581 Secondary hyperparathyroidism of renal origin: Secondary | ICD-10-CM | POA: Diagnosis not present

## 2022-07-20 DIAGNOSIS — N2581 Secondary hyperparathyroidism of renal origin: Secondary | ICD-10-CM | POA: Diagnosis not present

## 2022-07-20 DIAGNOSIS — Z992 Dependence on renal dialysis: Secondary | ICD-10-CM | POA: Diagnosis not present

## 2022-07-20 DIAGNOSIS — N186 End stage renal disease: Secondary | ICD-10-CM | POA: Diagnosis not present

## 2022-07-23 DIAGNOSIS — N186 End stage renal disease: Secondary | ICD-10-CM | POA: Diagnosis not present

## 2022-07-23 DIAGNOSIS — N2581 Secondary hyperparathyroidism of renal origin: Secondary | ICD-10-CM | POA: Diagnosis not present

## 2022-07-23 DIAGNOSIS — Z992 Dependence on renal dialysis: Secondary | ICD-10-CM | POA: Diagnosis not present

## 2022-07-25 DIAGNOSIS — N2581 Secondary hyperparathyroidism of renal origin: Secondary | ICD-10-CM | POA: Diagnosis not present

## 2022-07-25 DIAGNOSIS — N186 End stage renal disease: Secondary | ICD-10-CM | POA: Diagnosis not present

## 2022-07-25 DIAGNOSIS — E1122 Type 2 diabetes mellitus with diabetic chronic kidney disease: Secondary | ICD-10-CM | POA: Diagnosis not present

## 2022-07-25 DIAGNOSIS — Z992 Dependence on renal dialysis: Secondary | ICD-10-CM | POA: Diagnosis not present

## 2022-07-27 DIAGNOSIS — N2581 Secondary hyperparathyroidism of renal origin: Secondary | ICD-10-CM | POA: Diagnosis not present

## 2022-07-27 DIAGNOSIS — N186 End stage renal disease: Secondary | ICD-10-CM | POA: Diagnosis not present

## 2022-07-27 DIAGNOSIS — Z992 Dependence on renal dialysis: Secondary | ICD-10-CM | POA: Diagnosis not present

## 2022-07-30 DIAGNOSIS — Z992 Dependence on renal dialysis: Secondary | ICD-10-CM | POA: Diagnosis not present

## 2022-07-30 DIAGNOSIS — N2581 Secondary hyperparathyroidism of renal origin: Secondary | ICD-10-CM | POA: Diagnosis not present

## 2022-07-30 DIAGNOSIS — N186 End stage renal disease: Secondary | ICD-10-CM | POA: Diagnosis not present

## 2022-08-01 DIAGNOSIS — N186 End stage renal disease: Secondary | ICD-10-CM | POA: Diagnosis not present

## 2022-08-01 DIAGNOSIS — Z992 Dependence on renal dialysis: Secondary | ICD-10-CM | POA: Diagnosis not present

## 2022-08-01 DIAGNOSIS — N2581 Secondary hyperparathyroidism of renal origin: Secondary | ICD-10-CM | POA: Diagnosis not present

## 2022-08-03 DIAGNOSIS — N186 End stage renal disease: Secondary | ICD-10-CM | POA: Diagnosis not present

## 2022-08-03 DIAGNOSIS — Z992 Dependence on renal dialysis: Secondary | ICD-10-CM | POA: Diagnosis not present

## 2022-08-03 DIAGNOSIS — N2581 Secondary hyperparathyroidism of renal origin: Secondary | ICD-10-CM | POA: Diagnosis not present

## 2022-08-06 DIAGNOSIS — N186 End stage renal disease: Secondary | ICD-10-CM | POA: Diagnosis not present

## 2022-08-06 DIAGNOSIS — N2581 Secondary hyperparathyroidism of renal origin: Secondary | ICD-10-CM | POA: Diagnosis not present

## 2022-08-06 DIAGNOSIS — Z992 Dependence on renal dialysis: Secondary | ICD-10-CM | POA: Diagnosis not present

## 2022-08-08 DIAGNOSIS — Z992 Dependence on renal dialysis: Secondary | ICD-10-CM | POA: Diagnosis not present

## 2022-08-08 DIAGNOSIS — N2581 Secondary hyperparathyroidism of renal origin: Secondary | ICD-10-CM | POA: Diagnosis not present

## 2022-08-08 DIAGNOSIS — N186 End stage renal disease: Secondary | ICD-10-CM | POA: Diagnosis not present

## 2022-08-10 DIAGNOSIS — N2581 Secondary hyperparathyroidism of renal origin: Secondary | ICD-10-CM | POA: Diagnosis not present

## 2022-08-10 DIAGNOSIS — Z992 Dependence on renal dialysis: Secondary | ICD-10-CM | POA: Diagnosis not present

## 2022-08-10 DIAGNOSIS — N186 End stage renal disease: Secondary | ICD-10-CM | POA: Diagnosis not present

## 2022-08-13 DIAGNOSIS — N2581 Secondary hyperparathyroidism of renal origin: Secondary | ICD-10-CM | POA: Diagnosis not present

## 2022-08-13 DIAGNOSIS — N186 End stage renal disease: Secondary | ICD-10-CM | POA: Diagnosis not present

## 2022-08-13 DIAGNOSIS — Z992 Dependence on renal dialysis: Secondary | ICD-10-CM | POA: Diagnosis not present

## 2022-08-15 DIAGNOSIS — N186 End stage renal disease: Secondary | ICD-10-CM | POA: Diagnosis not present

## 2022-08-15 DIAGNOSIS — Z992 Dependence on renal dialysis: Secondary | ICD-10-CM | POA: Diagnosis not present

## 2022-08-15 DIAGNOSIS — N2581 Secondary hyperparathyroidism of renal origin: Secondary | ICD-10-CM | POA: Diagnosis not present

## 2022-08-17 DIAGNOSIS — N2581 Secondary hyperparathyroidism of renal origin: Secondary | ICD-10-CM | POA: Diagnosis not present

## 2022-08-17 DIAGNOSIS — N186 End stage renal disease: Secondary | ICD-10-CM | POA: Diagnosis not present

## 2022-08-17 DIAGNOSIS — Z992 Dependence on renal dialysis: Secondary | ICD-10-CM | POA: Diagnosis not present

## 2022-08-20 DIAGNOSIS — N186 End stage renal disease: Secondary | ICD-10-CM | POA: Diagnosis not present

## 2022-08-20 DIAGNOSIS — Z992 Dependence on renal dialysis: Secondary | ICD-10-CM | POA: Diagnosis not present

## 2022-08-20 DIAGNOSIS — N2581 Secondary hyperparathyroidism of renal origin: Secondary | ICD-10-CM | POA: Diagnosis not present

## 2022-08-22 DIAGNOSIS — Z992 Dependence on renal dialysis: Secondary | ICD-10-CM | POA: Diagnosis not present

## 2022-08-22 DIAGNOSIS — N186 End stage renal disease: Secondary | ICD-10-CM | POA: Diagnosis not present

## 2022-08-22 DIAGNOSIS — N2581 Secondary hyperparathyroidism of renal origin: Secondary | ICD-10-CM | POA: Diagnosis not present

## 2022-08-24 DIAGNOSIS — Z992 Dependence on renal dialysis: Secondary | ICD-10-CM | POA: Diagnosis not present

## 2022-08-24 DIAGNOSIS — N186 End stage renal disease: Secondary | ICD-10-CM | POA: Diagnosis not present

## 2022-08-24 DIAGNOSIS — N2581 Secondary hyperparathyroidism of renal origin: Secondary | ICD-10-CM | POA: Diagnosis not present

## 2022-08-25 DIAGNOSIS — N186 End stage renal disease: Secondary | ICD-10-CM | POA: Diagnosis not present

## 2022-08-25 DIAGNOSIS — Z992 Dependence on renal dialysis: Secondary | ICD-10-CM | POA: Diagnosis not present

## 2022-08-25 DIAGNOSIS — E1122 Type 2 diabetes mellitus with diabetic chronic kidney disease: Secondary | ICD-10-CM | POA: Diagnosis not present

## 2022-08-27 DIAGNOSIS — N186 End stage renal disease: Secondary | ICD-10-CM | POA: Diagnosis not present

## 2022-08-27 DIAGNOSIS — N2581 Secondary hyperparathyroidism of renal origin: Secondary | ICD-10-CM | POA: Diagnosis not present

## 2022-08-27 DIAGNOSIS — Z992 Dependence on renal dialysis: Secondary | ICD-10-CM | POA: Diagnosis not present

## 2022-08-28 ENCOUNTER — Encounter: Payer: Self-pay | Admitting: Family Medicine

## 2022-08-28 ENCOUNTER — Ambulatory Visit: Payer: Medicare HMO | Attending: Family Medicine | Admitting: Family Medicine

## 2022-08-28 VITALS — BP 128/83 | HR 96 | Ht 72.0 in | Wt 323.6 lb

## 2022-08-28 DIAGNOSIS — E669 Obesity, unspecified: Secondary | ICD-10-CM

## 2022-08-28 DIAGNOSIS — I1311 Hypertensive heart and chronic kidney disease without heart failure, with stage 5 chronic kidney disease, or end stage renal disease: Secondary | ICD-10-CM | POA: Diagnosis not present

## 2022-08-28 DIAGNOSIS — E1169 Type 2 diabetes mellitus with other specified complication: Secondary | ICD-10-CM

## 2022-08-28 DIAGNOSIS — E1122 Type 2 diabetes mellitus with diabetic chronic kidney disease: Secondary | ICD-10-CM

## 2022-08-28 DIAGNOSIS — J9611 Chronic respiratory failure with hypoxia: Secondary | ICD-10-CM

## 2022-08-28 DIAGNOSIS — Z992 Dependence on renal dialysis: Secondary | ICD-10-CM | POA: Diagnosis not present

## 2022-08-28 DIAGNOSIS — M62838 Other muscle spasm: Secondary | ICD-10-CM

## 2022-08-28 DIAGNOSIS — J439 Emphysema, unspecified: Secondary | ICD-10-CM | POA: Diagnosis not present

## 2022-08-28 DIAGNOSIS — N186 End stage renal disease: Secondary | ICD-10-CM

## 2022-08-28 LAB — POCT GLYCOSYLATED HEMOGLOBIN (HGB A1C): HbA1c, POC (controlled diabetic range): 6 % (ref 0.0–7.0)

## 2022-08-28 MED ORDER — AMLODIPINE BESYLATE 5 MG PO TABS: 5.0000 mg | ORAL_TABLET | Freq: Every day | ORAL | 1 refills | Status: DC

## 2022-08-28 MED ORDER — METHOCARBAMOL 500 MG PO TABS: 500.0000 mg | ORAL_TABLET | Freq: Three times a day (TID) | ORAL | 1 refills | Status: DC | PRN

## 2022-08-28 MED ORDER — ATORVASTATIN CALCIUM 40 MG PO TABS: ORAL_TABLET | ORAL | 1 refills | Status: DC

## 2022-08-28 MED ORDER — SEMAGLUTIDE (1 MG/DOSE) 4 MG/3ML ~~LOC~~ SOPN: 1.0000 mg | PEN_INJECTOR | SUBCUTANEOUS | 6 refills | Status: DC

## 2022-08-28 NOTE — Progress Notes (Signed)
Subjective:  Patient ID: Adam Dennis, male    DOB: [birth year]  Age: 62 y.o. MRN: [id number]  CC: Diabetes   HPI Adam Dennis is a 62 y.o. year old male with a history of type 2 diabetes (A1c 6.0), hypertension, ESRD on HD, obstructive sleep apnea (on BiPAP at night), combined systolic and diastolic CHF (EF [id number] from echo of 10/2021),emphysema, chronic respiratory failure ( on 4L oxygen)  who presents today for chronic condition management.   Interval History:  He denies presence of dyspnea and sometimes even takes off his oxygen.  At the moment he is wearing his oxygen in the office. He continues with his hemodialysis sessions but complains of cramps which occur after dialysis.  He does have meloxicam on his med list but that has been ineffective.  From a diabetes standpoint he is on Ozempic and is tolerating it well.  In the last 3 months he has lost 1 pound but in 3 months prior to that did lose 5 pounds.  He has no hypoglycemia.  His diabetic retinopathy is managed by ophthalmology. Endorses adherence with his statin and antihypertensive.  Past Medical History:  Diagnosis Date   Arthritis    "back" (02/06/2017)   CHF (congestive heart failure)    new onset /Encounter Date 09/12/2006   Chronic combined systolic and diastolic heart failure    CKD (chronic kidney disease) stage 4, GFR 15-29 ml/min    COPD (chronic obstructive pulmonary disease)    End stage renal disease on dialysis    Tues/ Thur/ Sat [place]   Glaucoma, right eye    High cholesterol    Hypertension    Hypertrophy of tonsils alone    Myocardial infarction    "small one; ~ 2008" (02/06/2017)   Obesity, unspecified    On home oxygen therapy    "4L; 24/7" (04/10/2017)   OSA on CPAP    Type II diabetes mellitus     Past Surgical History:  Procedure Laterality Date   AV FISTULA PLACEMENT Right 07/01/2019   Procedure: Right arm arteriovenous fistual;  Surgeon: Waynetta Sandy, MD;   Location: [place];  Service: Vascular;  Laterality: Right;   FISTULA SUPERFICIALIZATION Right 09/01/2019   Procedure: RIGHT UPPER ARM FISTULA  TRANSPOSITION OF RIGHT UPPPER ARM BRACHIAL CEPHALIC FISTULA;  Surgeon: Waynetta Sandy, MD;  Location: [place];  Service: Vascular;  Laterality: Right;   HERNIA REPAIR     INSERTION OF DIALYSIS CATHETER Right 07/01/2019   Procedure: INSERTION OF TUNNELED DIALYSIS CATHETER;  Surgeon: Waynetta Sandy, MD;  Location: [place];  Service: Vascular;  Laterality: Right;   TEE WITHOUT CARDIOVERSION N/A 04/16/2017   Procedure: TRANSESOPHAGEAL ECHOCARDIOGRAM (TEE);  Surgeon: [name], MD;  Location: [facility] ENDOSCOPY;  Service: Cardiovascular;  Laterality: N/A;   UMBILICAL HERNIA REPAIR  1960s   "when I was little baby"    Family History  Problem Relation Age of Onset   Hypertension Mother    Diabetes Sister    Colon cancer Sister    Diabetes Brother    Cancer Maternal Aunt    Amblyopia Neg Hx    Blindness Neg Hx    Cataracts Neg Hx    Glaucoma Neg Hx    Macular degeneration Neg Hx    Strabismus Neg Hx    Retinal detachment Neg Hx    Retinitis pigmentosa Neg Hx     Social History   Socioeconomic History   Marital status: Married  Spouse name: Not on file   Number of children: 0   Years of education: Not on file   Highest education level: Not on file  Occupational History   Occupation: works as a Training and development officer  Tobacco Use   Smoking status: Never   Smokeless tobacco: Never  Vaping Use   Vaping Use: Never used  Substance and Sexual Activity   Alcohol use: Yes    Comment: occasional   Drug use: No   Sexual activity: Not Currently  Other Topics Concern   Not on file  Social History Narrative   Not on file   Social Determinants of Health   Financial Resource Strain: Low Risk  (04/01/2022)   Overall Financial Resource Strain (CARDIA)    Difficulty of Paying Living Expenses: Not hard at all  Food Insecurity: No Food Insecurity  (04/01/2022)   Hunger Vital Sign    Worried About Running Out of Food in the Last Year: Never true    [place] in the Last Year: Never true  Transportation Needs: No Transportation Needs (04/01/2022)   PRAPARE - Hydrologist (Medical): No    Lack of Transportation (Non-Medical): No  Physical Activity: Insufficiently Active (04/01/2022)   Exercise Vital Sign    Days of Exercise per Week: 1 day    Minutes of Exercise per Session: 30 min  Stress: Stress Concern Present (04/01/2022)   [place]    Feeling of Stress : To some extent  Social Connections: Moderately Isolated (04/01/2022)   Social Connection and Isolation Panel [NHANES]    Frequency of Communication with Friends and Family: More than three times a week    Frequency of Social Gatherings with Friends and Family: More than three times a week    Attends Religious Services: Never    Marine scientist or Organizations: No    Attends Music therapist: Never    Marital Status: Married    No Known Allergies  Outpatient Medications Prior to Visit  Medication Sig Dispense Refill   ACCU-CHEK SOFTCLIX LANCETS lancets Use as instructed to check blood sugar up to 3 times daily. 100 each 11   aspirin EC 81 MG tablet Take 81 mg by mouth daily.     Blood Glucose Monitoring Suppl (ACCU-CHEK AVIVA) device Use as instructed to check blood sugar up to 3 times daily. 1 each 0   dorzolamide-timolol (COSOPT) 22.3-6.8 MG/ML ophthalmic solution Place 1 drop into both eyes 2 (two) times daily.     FEROSUL 325 (65 Fe) MG tablet TAKE 1 TABLET(325 MG) BY MOUTH DAILY WITH BREAKFAST (Patient taking differently: Take 325 mg by mouth Every Tuesday,Thursday,and Saturday with dialysis.) 100 tablet 3   glucose blood (ACCU-CHEK AVIVA PLUS) test strip Use as instructed to check blood sugar up to 3 times daily. 100 each 11   Lancet Devices (ACCU-CHEK  SOFTCLIX) lancets Use as instructed daily. 1 each 5   nitroGLYCERIN (NITROSTAT) 0.4 MG SL tablet PLACE 1 TABLET BY MOUTH UNDER TONGUE EVERY 5 MINUTES FOR 3 DOSES AS NEEDED FOR CHEST PAIN (Patient taking differently: Place 0.4 mg under the tongue every 5 (five) minutes as needed for chest pain.) 25 tablet 0   sevelamer carbonate (RENVELA) 800 MG tablet Take 1 tablet (800 mg total) by mouth as needed (with snacks). 90 tablet 1   TRAVATAN Z 0.004 % SOLN ophthalmic solution Place 1 drop into both eyes at bedtime.  12   amLODipine (NORVASC) 5 MG tablet TAKE 1 TABLET(5 MG) BY MOUTH DAILY 90 tablet 0   atorvastatin (LIPITOR) 40 MG tablet TAKE 1 TABLET(40 MG) BY MOUTH DAILY 90 tablet 1   methocarbamol (ROBAXIN) 500 MG tablet Take 1 tablet (500 mg total) by mouth every 8 (eight) hours as needed for muscle spasms. 60 tablet 3   Semaglutide,0.25 or 0.5MG /DOS, (OZEMPIC, 0.25 OR 0.5 MG/DOSE,) 2 MG/1.5ML SOPN Inject 0.5 mg into the skin once a week. 2 mL 6   No facility-administered medications prior to visit.     ROS Review of Systems  Constitutional:  Negative for activity change and appetite change.  HENT:  Negative for sinus pressure and sore throat.   Respiratory:  Negative for chest tightness, shortness of breath and wheezing.   Cardiovascular:  Negative for chest pain and palpitations.  Gastrointestinal:  Negative for abdominal distention, abdominal pain and constipation.  Genitourinary: Negative.   Musculoskeletal: Negative.   Psychiatric/Behavioral:  Negative for behavioral problems and dysphoric mood.     Objective:  BP 128/83   Pulse 96   Ht 6' (1.829 m)   Wt (!) 323 lb 9.6 oz (146.8 kg)   SpO2 96%   BMI 43.89 kg/m      08/28/2022    8:43 AM 06/26/2022    8:33 AM 04/01/2022    8:41 AM  BP/Weight  Systolic BP 0000000 [id number]  Diastolic BP 83 70 92  Wt. (Lbs) 323.6 324.6 329.6  BMI 43.89 kg/m2 44.02 kg/m2 44.7 kg/m2      Physical Exam Constitutional:      Appearance: He is  well-developed.  HENT:     Nose:     Comments: On 4 L oxygen via nasal cannula Cardiovascular:     Rate and Rhythm: Normal rate.     Heart sounds: Normal heart sounds. No murmur heard. Pulmonary:     Effort: Pulmonary effort is normal.     Breath sounds: Normal breath sounds. No wheezing or rales.  Chest:     Chest wall: No tenderness.  Abdominal:     General: Bowel sounds are normal. There is no distension.     Palpations: Abdomen is soft. There is no mass.     Tenderness: There is no abdominal tenderness.  Musculoskeletal:        General: Normal range of motion.     Right lower leg: No edema.     Left lower leg: No edema.  Neurological:     Mental Status: He is alert and oriented to person, place, and time.  Psychiatric:        Mood and Affect: Mood normal.        Latest Ref Rng & Units 01/26/2022    8:02 AM 01/25/2022    6:27 AM 01/23/2022   11:34 AM  CMP  Glucose 70 - 99 mg/dL 110  96  90   BUN 6 - 20 mg/dL 48  28  27   Creatinine 0.61 - 1.24 mg/dL 9.99  7.54  8.64   Sodium 135 - 145 mmol/L 137  135  136   Potassium 3.5 - 5.1 mmol/L 4.5  4.5  4.7   Chloride 98 - 111 mmol/L 98  94  93   CO2 22 - 32 mmol/L 29  31  29    Calcium 8.9 - 10.3 mg/dL 9.4  9.5  9.3     Lipid Panel     Component Value Date/Time   CHOL 136 11/11/2021  0057   CHOL 146 08/20/2021 1056   TRIG 104 11/11/2021 0057   HDL 66 11/11/2021 0057   HDL 54 08/20/2021 1056   CHOLHDL 2.1 11/11/2021 0057   VLDL 21 11/11/2021 0057   LDLCALC 49 11/11/2021 0057   LDLCALC 72 08/20/2021 1056    CBC    Component Value Date/Time   WBC 5.5 01/26/2022 0802   RBC 4.28 01/26/2022 0802   HGB 11.5 (L) 01/26/2022 0802   HGB 12.8 (L) 03/24/2019 1017   HCT 38.0 (L) 01/26/2022 0802   HCT 39.7 03/24/2019 1017   PLT 257 01/26/2022 0802   PLT 191 03/24/2019 1017   MCV 88.8 01/26/2022 0802   MCV 87 03/24/2019 1017   MCH 26.9 01/26/2022 0802   MCHC 30.3 01/26/2022 0802   RDW 17.5 (H) 01/26/2022 0802   RDW 13.9  03/24/2019 1017   LYMPHSABS 1.0 01/21/2022 0845   LYMPHSABS 1.3 03/24/2019 1017   MONOABS 0.6 01/21/2022 0845   EOSABS 0.1 01/21/2022 0845   EOSABS 0.0 03/24/2019 1017   BASOSABS 0.0 01/21/2022 0845   BASOSABS 0.0 03/24/2019 1017    Lab Results  Component Value Date   HGBA1C 6.0 08/28/2022    Assessment & Plan:  1. Type 2 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, without long-term current use of insulin Controlled with A1c of 6.0 Increase Ozempic dose from 0.5 mg to 1 mg for additional weight benefit Counseled on Diabetic diet, my plate method, [id number] minutes of moderate intensity exercise/week Blood sugar logs with fasting goals of 80-120 mg/dl, random of less than 180 and in the event of sugars less than 60 mg/dl or greater than 400 mg/dl encouraged to notify the clinic. Advised on the need for annual eye exams, annual foot exams, Pneumonia vaccine. - POCT glycosylated hemoglobin (Hb A1C) - Semaglutide, 1 MG/DOSE, 4 MG/3ML SOPN; Inject 1 mg as directed once a week.  Dispense: 3 mL; Refill: 6  2. Type 2 diabetes mellitus with obesity See #1 above Caloric restriction - atorvastatin (LIPITOR) 40 MG tablet; TAKE 1 TABLET(40 MG) BY MOUTH DAILY  Dispense: 90 tablet; Refill: 1  3. Morbid obesity See #1 above Increase physical activity as tolerated, caloric restriction  4. Chronic respiratory failure with hypoxia Doing well on 4 L oxygen  5. Muscle spasm Symptoms occur after dialysis Likely due to fluid loss Continue Robaxin, rest - methocarbamol (ROBAXIN) 500 MG tablet; Take 1 tablet (500 mg total) by mouth every 8 (eight) hours as needed for muscle spasms.  Dispense: 180 tablet; Refill: 1  6. 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 Continue hemodialysis per schedule - amLODipine (NORVASC) 5 MG tablet; Take 1 tablet (5 mg total) by mouth daily.  Dispense: 90  tablet; Refill: 1   Meds ordered this encounter  Medications   Semaglutide, 1 MG/DOSE, 4 MG/3ML SOPN    Sig: Inject 1 mg as directed once a week.    Dispense:  3 mL    Refill:  6    Dose increase   amLODipine (NORVASC) 5 MG tablet    Sig: Take 1 tablet (5 mg total) by mouth daily.    Dispense:  90 tablet    Refill:  1   atorvastatin (LIPITOR) 40 MG tablet    Sig: TAKE 1 TABLET(40 MG) BY MOUTH DAILY    Dispense:  90 tablet    Refill:  1  methocarbamol (ROBAXIN) 500 MG tablet    Sig: Take 1 tablet (500 mg total) by mouth every 8 (eight) hours as needed for muscle spasms.    Dispense:  180 tablet    Refill:  1    Follow-up: Return in about 6 months (around 02/27/2023) for Chronic medical conditions.       Charlott Rakes, MD, FAAFP. [facility] [place], [place]   08/28/2022, 9:01 AM

## 2022-08-28 NOTE — Patient Instructions (Signed)

## 2022-08-29 DIAGNOSIS — Z992 Dependence on renal dialysis: Secondary | ICD-10-CM | POA: Diagnosis not present

## 2022-08-29 DIAGNOSIS — N186 End stage renal disease: Secondary | ICD-10-CM | POA: Diagnosis not present

## 2022-08-29 DIAGNOSIS — N2581 Secondary hyperparathyroidism of renal origin: Secondary | ICD-10-CM | POA: Diagnosis not present

## 2022-08-31 DIAGNOSIS — N2581 Secondary hyperparathyroidism of renal origin: Secondary | ICD-10-CM | POA: Diagnosis not present

## 2022-08-31 DIAGNOSIS — Z992 Dependence on renal dialysis: Secondary | ICD-10-CM | POA: Diagnosis not present

## 2022-08-31 DIAGNOSIS — N186 End stage renal disease: Secondary | ICD-10-CM | POA: Diagnosis not present

## 2022-09-03 DIAGNOSIS — Z992 Dependence on renal dialysis: Secondary | ICD-10-CM | POA: Diagnosis not present

## 2022-09-03 DIAGNOSIS — N186 End stage renal disease: Secondary | ICD-10-CM | POA: Diagnosis not present

## 2022-09-03 DIAGNOSIS — N2581 Secondary hyperparathyroidism of renal origin: Secondary | ICD-10-CM | POA: Diagnosis not present

## 2022-09-05 DIAGNOSIS — N186 End stage renal disease: Secondary | ICD-10-CM | POA: Diagnosis not present

## 2022-09-05 DIAGNOSIS — N2581 Secondary hyperparathyroidism of renal origin: Secondary | ICD-10-CM | POA: Diagnosis not present

## 2022-09-05 DIAGNOSIS — Z992 Dependence on renal dialysis: Secondary | ICD-10-CM | POA: Diagnosis not present

## 2022-09-07 DIAGNOSIS — Z992 Dependence on renal dialysis: Secondary | ICD-10-CM | POA: Diagnosis not present

## 2022-09-07 DIAGNOSIS — N2581 Secondary hyperparathyroidism of renal origin: Secondary | ICD-10-CM | POA: Diagnosis not present

## 2022-09-07 DIAGNOSIS — N186 End stage renal disease: Secondary | ICD-10-CM | POA: Diagnosis not present

## 2022-09-10 ENCOUNTER — Telehealth: Payer: Self-pay

## 2022-09-10 DIAGNOSIS — N186 End stage renal disease: Secondary | ICD-10-CM | POA: Diagnosis not present

## 2022-09-10 DIAGNOSIS — Z992 Dependence on renal dialysis: Secondary | ICD-10-CM | POA: Diagnosis not present

## 2022-09-10 DIAGNOSIS — N2581 Secondary hyperparathyroidism of renal origin: Secondary | ICD-10-CM | POA: Diagnosis not present

## 2022-09-10 NOTE — Patient Outreach (Signed)
  Care Coordination   09/10/2022 Name: Adam Dennis MRN: [id number] DOB: 01-18-1961   Care Coordination Outreach Attempts:  An unsuccessful telephone outreach was attempted today to offer the patient information about available care coordination services as a benefit of their health plan.   Follow Up Plan:  Additional outreach attempts will be made to offer the patient care coordination information and services.   Encounter Outcome:  No Answer   Care Coordination Interventions:  No, not indicated    Bevelyn Ngo, BSW, CDP Social Worker, Certified Dementia Practitioner [facility] Care Management  Care Coordination [phone]

## 2022-09-12 DIAGNOSIS — Z992 Dependence on renal dialysis: Secondary | ICD-10-CM | POA: Diagnosis not present

## 2022-09-12 DIAGNOSIS — N2581 Secondary hyperparathyroidism of renal origin: Secondary | ICD-10-CM | POA: Diagnosis not present

## 2022-09-12 DIAGNOSIS — N186 End stage renal disease: Secondary | ICD-10-CM | POA: Diagnosis not present

## 2022-09-14 DIAGNOSIS — Z992 Dependence on renal dialysis: Secondary | ICD-10-CM | POA: Diagnosis not present

## 2022-09-14 DIAGNOSIS — N186 End stage renal disease: Secondary | ICD-10-CM | POA: Diagnosis not present

## 2022-09-14 DIAGNOSIS — N2581 Secondary hyperparathyroidism of renal origin: Secondary | ICD-10-CM | POA: Diagnosis not present

## 2022-09-17 ENCOUNTER — Ambulatory Visit: Payer: Self-pay

## 2022-09-17 DIAGNOSIS — N186 End stage renal disease: Secondary | ICD-10-CM | POA: Diagnosis not present

## 2022-09-17 DIAGNOSIS — N2581 Secondary hyperparathyroidism of renal origin: Secondary | ICD-10-CM | POA: Diagnosis not present

## 2022-09-17 DIAGNOSIS — Z992 Dependence on renal dialysis: Secondary | ICD-10-CM | POA: Diagnosis not present

## 2022-09-17 NOTE — Patient Outreach (Signed)
  Care Coordination   Initial Visit Note   09/17/2022 Name: Adam Dennis MRN: [id number] DOB: 1961-03-27  Adam Dennis is a 62 y.o. year old male who sees Hoy Register, MD for primary care. I spoke with  [name] by phone today.  What matters to the patients health and wellness today?  No concerns during today's call    Goals Addressed             This Visit's Progress    COMPLETED: Care Coordination Activities       Care Coordination Interventions: SDoH screening performed - no acute resource challenges identified at this time Determined the patient does not have concerns with medication costs and/or adherence Education provided on the role of the care coordination team - no follow up desired at this time Encouraged the patient to contact his primary care provider as needed         SDOH assessments and interventions completed:  Yes  SDOH Interventions Today    Flowsheet Row Most Recent Value  SDOH Interventions   Food Insecurity Interventions Intervention Not Indicated  Housing Interventions Intervention Not Indicated  Transportation Interventions Intervention Not Indicated  Utilities Interventions Intervention Not Indicated        Care Coordination Interventions:  Yes, provided   Interventions Today    Flowsheet Row Most Recent Value  Chronic Disease   Chronic disease during today's visit Diabetes, Chronic Kidney Disease/End Stage Renal Disease (ESRD), Hypertension (HTN)  General Interventions   General Interventions Discussed/Reviewed General Interventions Discussed, Doctor Visits  Doctor Visits Discussed/Reviewed Doctor Visits Reviewed  Education Interventions   Education Provided Provided Education  Provided Verbal Education On Other  [role of care coordination team]        Follow up plan: No further intervention required.   Encounter Outcome:  Pt. Visit Completed   Bevelyn Ngo, BSW, CDP Social Worker, Certified Dementia  Practitioner [facility] Care Management  Care Coordination [phone]

## 2022-09-17 NOTE — Patient Instructions (Signed)
Visit Information  Thank you for taking time to visit with me today. Please don't hesitate to contact me if I can be of assistance to you.   Following are the goals we discussed today:  -Contact your primary care provider as needed   If you are experiencing a Mental Health or Behavioral Health Crisis or need someone to talk to, please go to [facility] [address], [place] [phone]) call 911  Patient verbalizes understanding of instructions and care plan provided today and agrees to view in MyChart. Active MyChart status and patient understanding of how to access instructions and care plan via MyChart confirmed with patient.     No further follow up required: Please contact the care coordination team as needed  Bevelyn Ngo, Kenard Gower, CDP Social Worker, Certified Dementia Practitioner [facility] Care Management  Care Coordination [phone]

## 2022-09-19 DIAGNOSIS — Z992 Dependence on renal dialysis: Secondary | ICD-10-CM | POA: Diagnosis not present

## 2022-09-19 DIAGNOSIS — N2581 Secondary hyperparathyroidism of renal origin: Secondary | ICD-10-CM | POA: Diagnosis not present

## 2022-09-19 DIAGNOSIS — N186 End stage renal disease: Secondary | ICD-10-CM | POA: Diagnosis not present

## 2022-09-21 DIAGNOSIS — N2581 Secondary hyperparathyroidism of renal origin: Secondary | ICD-10-CM | POA: Diagnosis not present

## 2022-09-21 DIAGNOSIS — N186 End stage renal disease: Secondary | ICD-10-CM | POA: Diagnosis not present

## 2022-09-21 DIAGNOSIS — Z992 Dependence on renal dialysis: Secondary | ICD-10-CM | POA: Diagnosis not present

## 2022-09-24 DIAGNOSIS — N2581 Secondary hyperparathyroidism of renal origin: Secondary | ICD-10-CM | POA: Diagnosis not present

## 2022-09-24 DIAGNOSIS — N186 End stage renal disease: Secondary | ICD-10-CM | POA: Diagnosis not present

## 2022-09-24 DIAGNOSIS — E1122 Type 2 diabetes mellitus with diabetic chronic kidney disease: Secondary | ICD-10-CM | POA: Diagnosis not present

## 2022-09-24 DIAGNOSIS — Z992 Dependence on renal dialysis: Secondary | ICD-10-CM | POA: Diagnosis not present

## 2022-09-26 DIAGNOSIS — Z992 Dependence on renal dialysis: Secondary | ICD-10-CM | POA: Diagnosis not present

## 2022-09-26 DIAGNOSIS — N2581 Secondary hyperparathyroidism of renal origin: Secondary | ICD-10-CM | POA: Diagnosis not present

## 2022-09-26 DIAGNOSIS — N186 End stage renal disease: Secondary | ICD-10-CM | POA: Diagnosis not present

## 2022-09-28 DIAGNOSIS — Z992 Dependence on renal dialysis: Secondary | ICD-10-CM | POA: Diagnosis not present

## 2022-09-28 DIAGNOSIS — N2581 Secondary hyperparathyroidism of renal origin: Secondary | ICD-10-CM | POA: Diagnosis not present

## 2022-09-28 DIAGNOSIS — N186 End stage renal disease: Secondary | ICD-10-CM | POA: Diagnosis not present

## 2022-09-30 ENCOUNTER — Ambulatory Visit: Payer: Medicare HMO | Admitting: Family Medicine

## 2022-10-01 DIAGNOSIS — N2581 Secondary hyperparathyroidism of renal origin: Secondary | ICD-10-CM | POA: Diagnosis not present

## 2022-10-01 DIAGNOSIS — Z992 Dependence on renal dialysis: Secondary | ICD-10-CM | POA: Diagnosis not present

## 2022-10-01 DIAGNOSIS — N186 End stage renal disease: Secondary | ICD-10-CM | POA: Diagnosis not present

## 2022-10-03 DIAGNOSIS — Z992 Dependence on renal dialysis: Secondary | ICD-10-CM | POA: Diagnosis not present

## 2022-10-03 DIAGNOSIS — N2581 Secondary hyperparathyroidism of renal origin: Secondary | ICD-10-CM | POA: Diagnosis not present

## 2022-10-03 DIAGNOSIS — N186 End stage renal disease: Secondary | ICD-10-CM | POA: Diagnosis not present

## 2022-10-05 DIAGNOSIS — N186 End stage renal disease: Secondary | ICD-10-CM | POA: Diagnosis not present

## 2022-10-05 DIAGNOSIS — Z992 Dependence on renal dialysis: Secondary | ICD-10-CM | POA: Diagnosis not present

## 2022-10-05 DIAGNOSIS — N2581 Secondary hyperparathyroidism of renal origin: Secondary | ICD-10-CM | POA: Diagnosis not present

## 2022-10-08 DIAGNOSIS — N186 End stage renal disease: Secondary | ICD-10-CM | POA: Diagnosis not present

## 2022-10-08 DIAGNOSIS — N2581 Secondary hyperparathyroidism of renal origin: Secondary | ICD-10-CM | POA: Diagnosis not present

## 2022-10-08 DIAGNOSIS — Z992 Dependence on renal dialysis: Secondary | ICD-10-CM | POA: Diagnosis not present

## 2022-10-10 DIAGNOSIS — Z992 Dependence on renal dialysis: Secondary | ICD-10-CM | POA: Diagnosis not present

## 2022-10-10 DIAGNOSIS — N2581 Secondary hyperparathyroidism of renal origin: Secondary | ICD-10-CM | POA: Diagnosis not present

## 2022-10-10 DIAGNOSIS — N186 End stage renal disease: Secondary | ICD-10-CM | POA: Diagnosis not present

## 2022-10-12 DIAGNOSIS — N2581 Secondary hyperparathyroidism of renal origin: Secondary | ICD-10-CM | POA: Diagnosis not present

## 2022-10-12 DIAGNOSIS — N186 End stage renal disease: Secondary | ICD-10-CM | POA: Diagnosis not present

## 2022-10-12 DIAGNOSIS — Z992 Dependence on renal dialysis: Secondary | ICD-10-CM | POA: Diagnosis not present

## 2022-10-15 DIAGNOSIS — N2581 Secondary hyperparathyroidism of renal origin: Secondary | ICD-10-CM | POA: Diagnosis not present

## 2022-10-15 DIAGNOSIS — Z992 Dependence on renal dialysis: Secondary | ICD-10-CM | POA: Diagnosis not present

## 2022-10-15 DIAGNOSIS — N186 End stage renal disease: Secondary | ICD-10-CM | POA: Diagnosis not present

## 2022-10-17 DIAGNOSIS — Z992 Dependence on renal dialysis: Secondary | ICD-10-CM | POA: Diagnosis not present

## 2022-10-17 DIAGNOSIS — N2581 Secondary hyperparathyroidism of renal origin: Secondary | ICD-10-CM | POA: Diagnosis not present

## 2022-10-17 DIAGNOSIS — N186 End stage renal disease: Secondary | ICD-10-CM | POA: Diagnosis not present

## 2022-10-19 DIAGNOSIS — N2581 Secondary hyperparathyroidism of renal origin: Secondary | ICD-10-CM | POA: Diagnosis not present

## 2022-10-19 DIAGNOSIS — Z992 Dependence on renal dialysis: Secondary | ICD-10-CM | POA: Diagnosis not present

## 2022-10-19 DIAGNOSIS — N186 End stage renal disease: Secondary | ICD-10-CM | POA: Diagnosis not present

## 2022-10-22 DIAGNOSIS — N2581 Secondary hyperparathyroidism of renal origin: Secondary | ICD-10-CM | POA: Diagnosis not present

## 2022-10-22 DIAGNOSIS — Z992 Dependence on renal dialysis: Secondary | ICD-10-CM | POA: Diagnosis not present

## 2022-10-22 DIAGNOSIS — N186 End stage renal disease: Secondary | ICD-10-CM | POA: Diagnosis not present

## 2022-10-24 DIAGNOSIS — N2581 Secondary hyperparathyroidism of renal origin: Secondary | ICD-10-CM | POA: Diagnosis not present

## 2022-10-24 DIAGNOSIS — N186 End stage renal disease: Secondary | ICD-10-CM | POA: Diagnosis not present

## 2022-10-24 DIAGNOSIS — Z992 Dependence on renal dialysis: Secondary | ICD-10-CM | POA: Diagnosis not present

## 2022-10-25 DIAGNOSIS — E1122 Type 2 diabetes mellitus with diabetic chronic kidney disease: Secondary | ICD-10-CM | POA: Diagnosis not present

## 2022-10-25 DIAGNOSIS — Z992 Dependence on renal dialysis: Secondary | ICD-10-CM | POA: Diagnosis not present

## 2022-10-25 DIAGNOSIS — N186 End stage renal disease: Secondary | ICD-10-CM | POA: Diagnosis not present

## 2022-10-26 DIAGNOSIS — Z992 Dependence on renal dialysis: Secondary | ICD-10-CM | POA: Diagnosis not present

## 2022-10-26 DIAGNOSIS — N186 End stage renal disease: Secondary | ICD-10-CM | POA: Diagnosis not present

## 2022-10-26 DIAGNOSIS — N2581 Secondary hyperparathyroidism of renal origin: Secondary | ICD-10-CM | POA: Diagnosis not present

## 2022-10-29 DIAGNOSIS — Z992 Dependence on renal dialysis: Secondary | ICD-10-CM | POA: Diagnosis not present

## 2022-10-29 DIAGNOSIS — N2581 Secondary hyperparathyroidism of renal origin: Secondary | ICD-10-CM | POA: Diagnosis not present

## 2022-10-29 DIAGNOSIS — N186 End stage renal disease: Secondary | ICD-10-CM | POA: Diagnosis not present

## 2022-10-31 DIAGNOSIS — N2581 Secondary hyperparathyroidism of renal origin: Secondary | ICD-10-CM | POA: Diagnosis not present

## 2022-10-31 DIAGNOSIS — N186 End stage renal disease: Secondary | ICD-10-CM | POA: Diagnosis not present

## 2022-10-31 DIAGNOSIS — Z992 Dependence on renal dialysis: Secondary | ICD-10-CM | POA: Diagnosis not present

## 2022-11-02 DIAGNOSIS — N2581 Secondary hyperparathyroidism of renal origin: Secondary | ICD-10-CM | POA: Diagnosis not present

## 2022-11-02 DIAGNOSIS — N186 End stage renal disease: Secondary | ICD-10-CM | POA: Diagnosis not present

## 2022-11-02 DIAGNOSIS — Z992 Dependence on renal dialysis: Secondary | ICD-10-CM | POA: Diagnosis not present

## 2022-11-05 DIAGNOSIS — N186 End stage renal disease: Secondary | ICD-10-CM | POA: Diagnosis not present

## 2022-11-05 DIAGNOSIS — Z992 Dependence on renal dialysis: Secondary | ICD-10-CM | POA: Diagnosis not present

## 2022-11-05 DIAGNOSIS — N2581 Secondary hyperparathyroidism of renal origin: Secondary | ICD-10-CM | POA: Diagnosis not present

## 2022-11-07 DIAGNOSIS — Z992 Dependence on renal dialysis: Secondary | ICD-10-CM | POA: Diagnosis not present

## 2022-11-07 DIAGNOSIS — N2581 Secondary hyperparathyroidism of renal origin: Secondary | ICD-10-CM | POA: Diagnosis not present

## 2022-11-07 DIAGNOSIS — N186 End stage renal disease: Secondary | ICD-10-CM | POA: Diagnosis not present

## 2022-11-09 DIAGNOSIS — N186 End stage renal disease: Secondary | ICD-10-CM | POA: Diagnosis not present

## 2022-11-09 DIAGNOSIS — Z992 Dependence on renal dialysis: Secondary | ICD-10-CM | POA: Diagnosis not present

## 2022-11-09 DIAGNOSIS — N2581 Secondary hyperparathyroidism of renal origin: Secondary | ICD-10-CM | POA: Diagnosis not present

## 2022-11-12 DIAGNOSIS — N2581 Secondary hyperparathyroidism of renal origin: Secondary | ICD-10-CM | POA: Diagnosis not present

## 2022-11-12 DIAGNOSIS — Z992 Dependence on renal dialysis: Secondary | ICD-10-CM | POA: Diagnosis not present

## 2022-11-12 DIAGNOSIS — N186 End stage renal disease: Secondary | ICD-10-CM | POA: Diagnosis not present

## 2022-11-14 DIAGNOSIS — Z992 Dependence on renal dialysis: Secondary | ICD-10-CM | POA: Diagnosis not present

## 2022-11-14 DIAGNOSIS — N2581 Secondary hyperparathyroidism of renal origin: Secondary | ICD-10-CM | POA: Diagnosis not present

## 2022-11-14 DIAGNOSIS — N186 End stage renal disease: Secondary | ICD-10-CM | POA: Diagnosis not present

## 2022-11-16 DIAGNOSIS — N2581 Secondary hyperparathyroidism of renal origin: Secondary | ICD-10-CM | POA: Diagnosis not present

## 2022-11-16 DIAGNOSIS — N186 End stage renal disease: Secondary | ICD-10-CM | POA: Diagnosis not present

## 2022-11-16 DIAGNOSIS — Z992 Dependence on renal dialysis: Secondary | ICD-10-CM | POA: Diagnosis not present

## 2022-11-19 DIAGNOSIS — N2581 Secondary hyperparathyroidism of renal origin: Secondary | ICD-10-CM | POA: Diagnosis not present

## 2022-11-19 DIAGNOSIS — Z992 Dependence on renal dialysis: Secondary | ICD-10-CM | POA: Diagnosis not present

## 2022-11-19 DIAGNOSIS — N186 End stage renal disease: Secondary | ICD-10-CM | POA: Diagnosis not present

## 2022-11-21 DIAGNOSIS — N2581 Secondary hyperparathyroidism of renal origin: Secondary | ICD-10-CM | POA: Diagnosis not present

## 2022-11-21 DIAGNOSIS — Z992 Dependence on renal dialysis: Secondary | ICD-10-CM | POA: Diagnosis not present

## 2022-11-21 DIAGNOSIS — N186 End stage renal disease: Secondary | ICD-10-CM | POA: Diagnosis not present

## 2022-11-23 DIAGNOSIS — N186 End stage renal disease: Secondary | ICD-10-CM | POA: Diagnosis not present

## 2022-11-23 DIAGNOSIS — Z992 Dependence on renal dialysis: Secondary | ICD-10-CM | POA: Diagnosis not present

## 2022-11-23 DIAGNOSIS — N2581 Secondary hyperparathyroidism of renal origin: Secondary | ICD-10-CM | POA: Diagnosis not present

## 2022-11-24 DIAGNOSIS — E1122 Type 2 diabetes mellitus with diabetic chronic kidney disease: Secondary | ICD-10-CM | POA: Diagnosis not present

## 2022-11-24 DIAGNOSIS — N186 End stage renal disease: Secondary | ICD-10-CM | POA: Diagnosis not present

## 2022-11-24 DIAGNOSIS — Z992 Dependence on renal dialysis: Secondary | ICD-10-CM | POA: Diagnosis not present

## 2022-11-26 DIAGNOSIS — N2581 Secondary hyperparathyroidism of renal origin: Secondary | ICD-10-CM | POA: Diagnosis not present

## 2022-11-26 DIAGNOSIS — N186 End stage renal disease: Secondary | ICD-10-CM | POA: Diagnosis not present

## 2022-11-26 DIAGNOSIS — Z992 Dependence on renal dialysis: Secondary | ICD-10-CM | POA: Diagnosis not present

## 2022-11-28 DIAGNOSIS — Z992 Dependence on renal dialysis: Secondary | ICD-10-CM | POA: Diagnosis not present

## 2022-11-28 DIAGNOSIS — N2581 Secondary hyperparathyroidism of renal origin: Secondary | ICD-10-CM | POA: Diagnosis not present

## 2022-11-28 DIAGNOSIS — N186 End stage renal disease: Secondary | ICD-10-CM | POA: Diagnosis not present

## 2022-11-30 DIAGNOSIS — N2581 Secondary hyperparathyroidism of renal origin: Secondary | ICD-10-CM | POA: Diagnosis not present

## 2022-11-30 DIAGNOSIS — N186 End stage renal disease: Secondary | ICD-10-CM | POA: Diagnosis not present

## 2022-11-30 DIAGNOSIS — Z992 Dependence on renal dialysis: Secondary | ICD-10-CM | POA: Diagnosis not present

## 2022-12-03 DIAGNOSIS — N186 End stage renal disease: Secondary | ICD-10-CM | POA: Diagnosis not present

## 2022-12-03 DIAGNOSIS — Z992 Dependence on renal dialysis: Secondary | ICD-10-CM | POA: Diagnosis not present

## 2022-12-03 DIAGNOSIS — N2581 Secondary hyperparathyroidism of renal origin: Secondary | ICD-10-CM | POA: Diagnosis not present

## 2022-12-05 DIAGNOSIS — Z992 Dependence on renal dialysis: Secondary | ICD-10-CM | POA: Diagnosis not present

## 2022-12-05 DIAGNOSIS — N2581 Secondary hyperparathyroidism of renal origin: Secondary | ICD-10-CM | POA: Diagnosis not present

## 2022-12-05 DIAGNOSIS — N186 End stage renal disease: Secondary | ICD-10-CM | POA: Diagnosis not present

## 2022-12-07 DIAGNOSIS — N186 End stage renal disease: Secondary | ICD-10-CM | POA: Diagnosis not present

## 2022-12-07 DIAGNOSIS — Z992 Dependence on renal dialysis: Secondary | ICD-10-CM | POA: Diagnosis not present

## 2022-12-07 DIAGNOSIS — N2581 Secondary hyperparathyroidism of renal origin: Secondary | ICD-10-CM | POA: Diagnosis not present

## 2022-12-10 DIAGNOSIS — Z992 Dependence on renal dialysis: Secondary | ICD-10-CM | POA: Diagnosis not present

## 2022-12-10 DIAGNOSIS — N2581 Secondary hyperparathyroidism of renal origin: Secondary | ICD-10-CM | POA: Diagnosis not present

## 2022-12-10 DIAGNOSIS — N186 End stage renal disease: Secondary | ICD-10-CM | POA: Diagnosis not present

## 2022-12-12 DIAGNOSIS — Z992 Dependence on renal dialysis: Secondary | ICD-10-CM | POA: Diagnosis not present

## 2022-12-12 DIAGNOSIS — N2581 Secondary hyperparathyroidism of renal origin: Secondary | ICD-10-CM | POA: Diagnosis not present

## 2022-12-12 DIAGNOSIS — N186 End stage renal disease: Secondary | ICD-10-CM | POA: Diagnosis not present

## 2022-12-13 IMAGING — US US RENAL
1 series · 14 of 19 positions shown · non-contrast
Comparison: April 06, 2019

CLINICAL DATA: Hematuria.

EXAM:
RENAL / URINARY TRACT ULTRASOUND COMPLETE

[Series 1: us renal · 0.23mm/px · 14 of 19 slices shown]
[im 1/19]
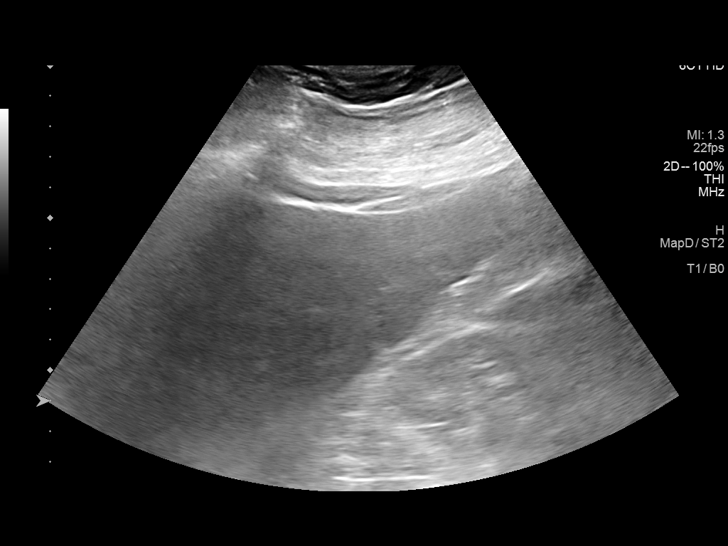
[im 3/19]
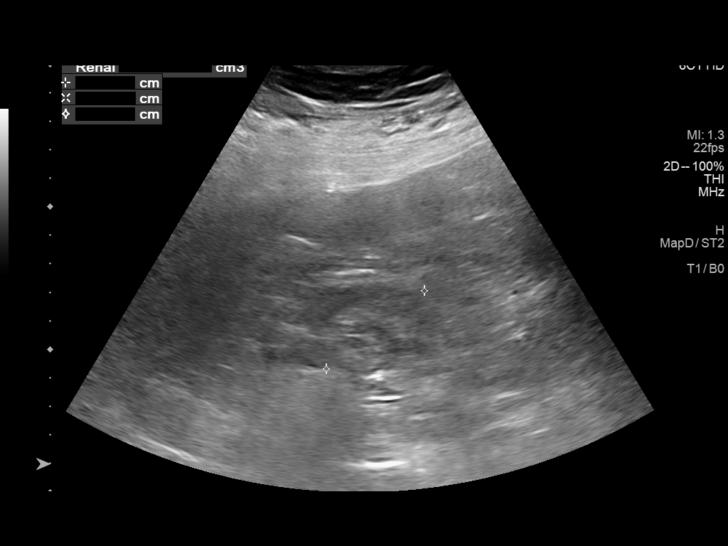
[im 4/19]
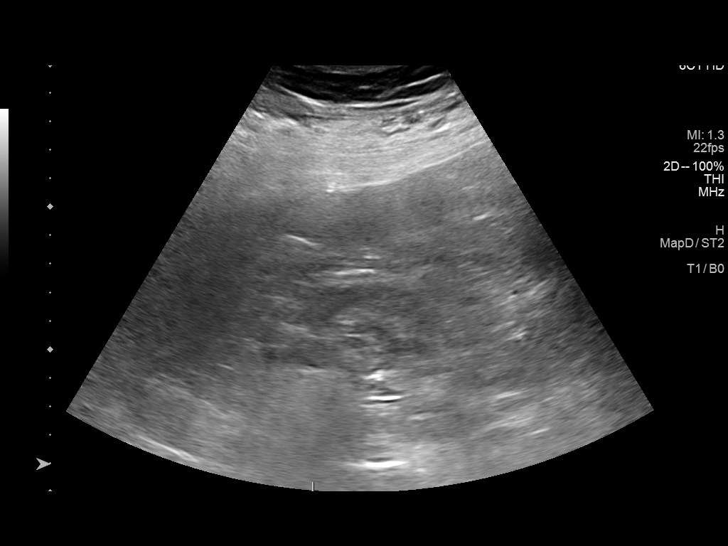
[im 5/19]
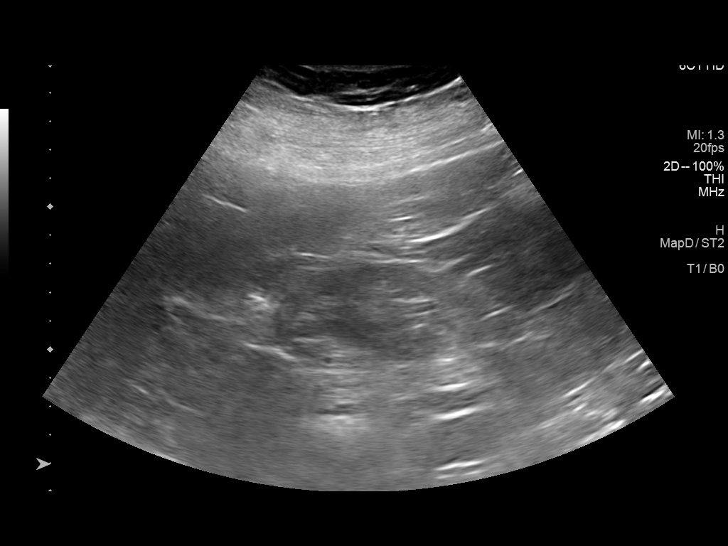
[im 7/19]
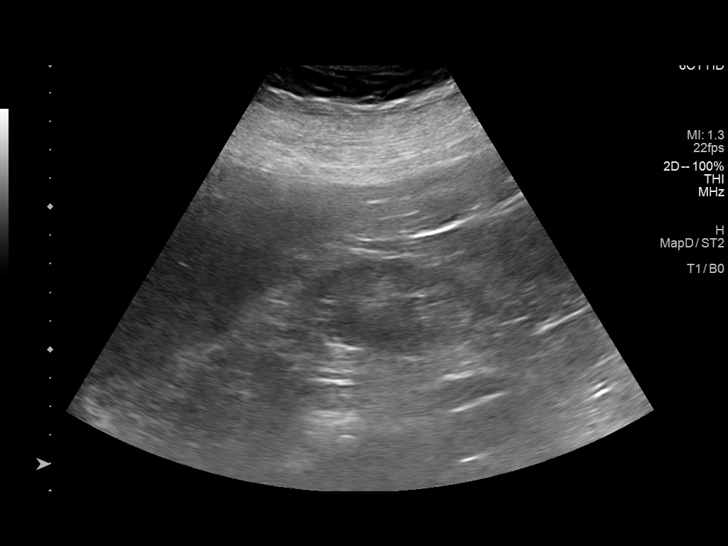
[im 8/19]
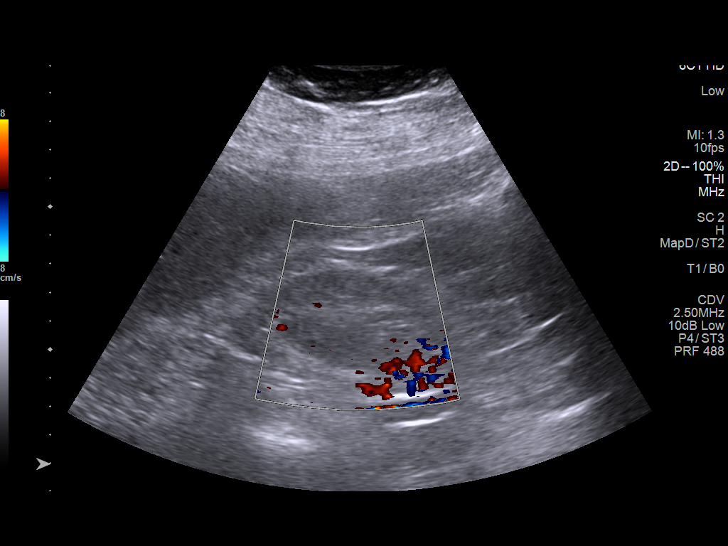
[im 9/19]
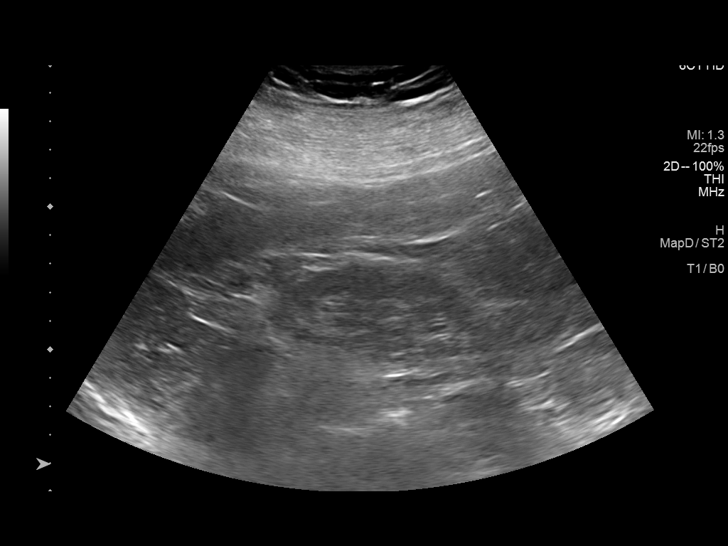
[im 11/19]
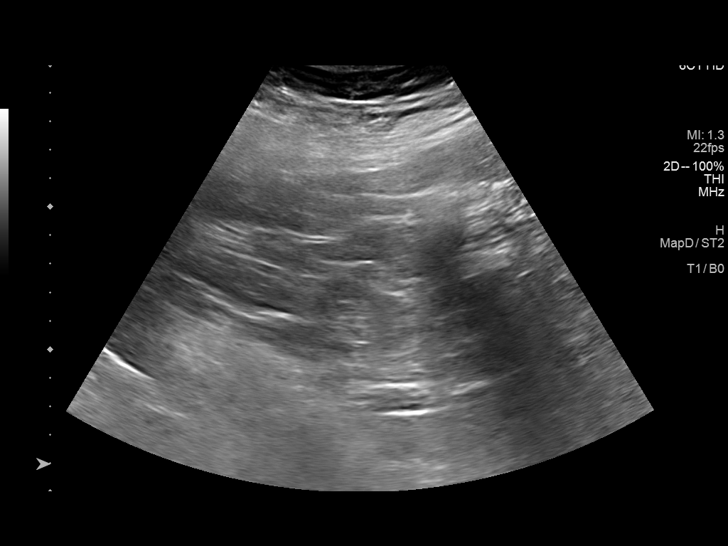
[im 12/19]
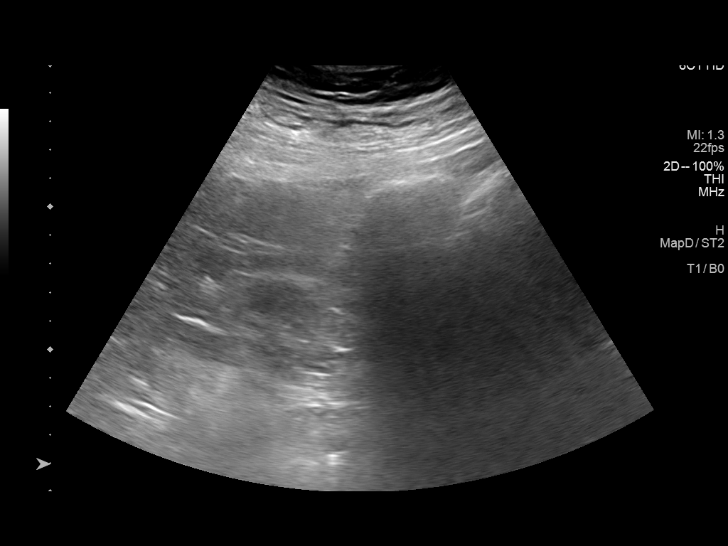
[im 13/19]
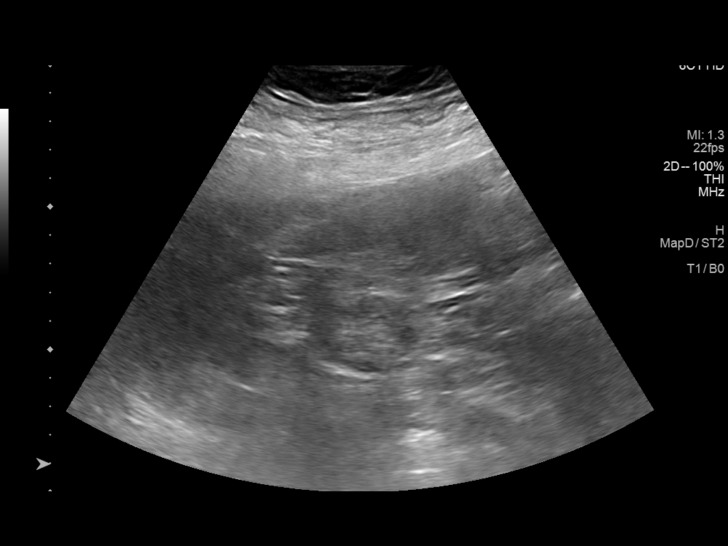
[im 15/19]
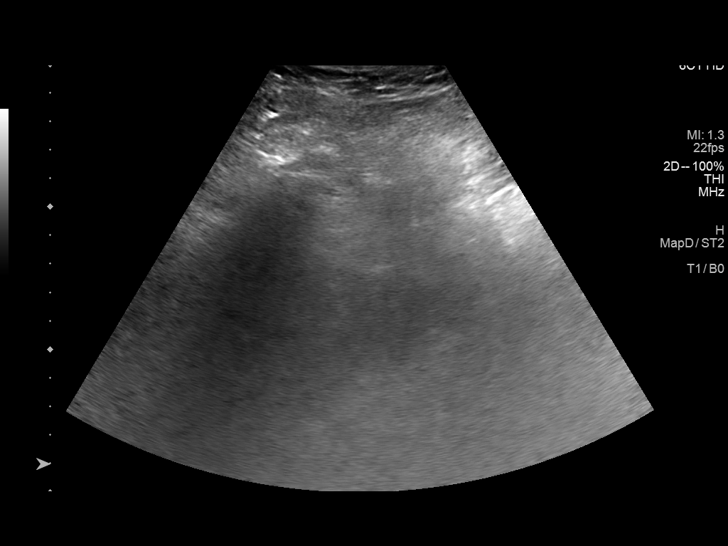
[im 16/19]
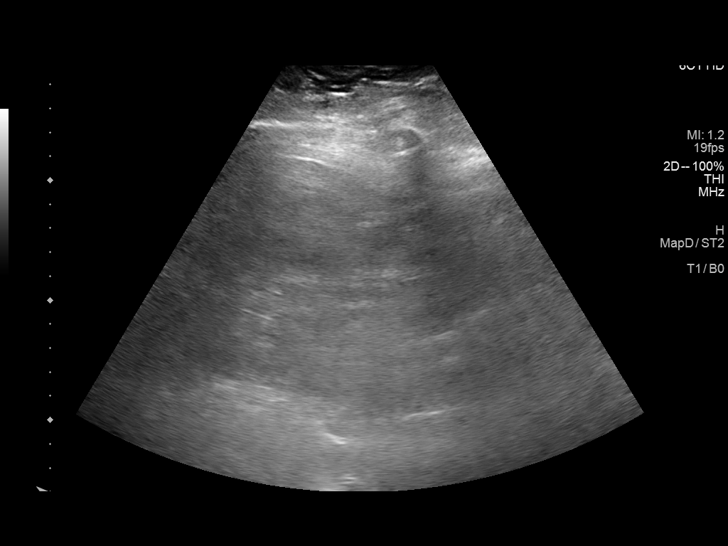
[im 17/19]
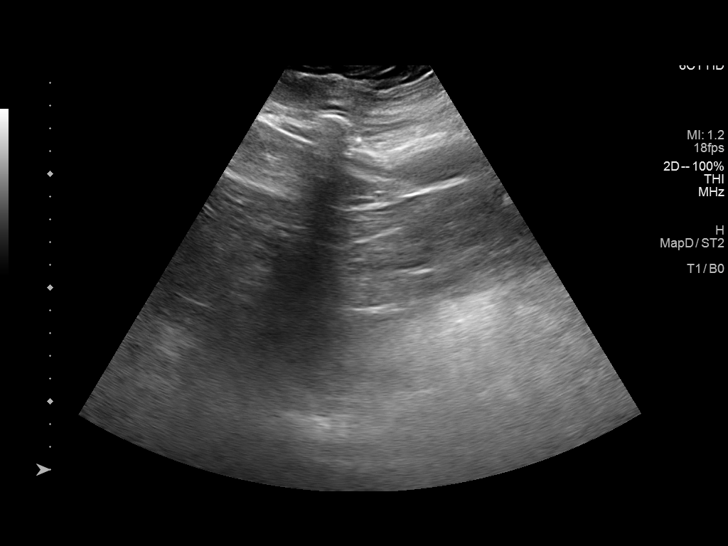
[im 19/19]
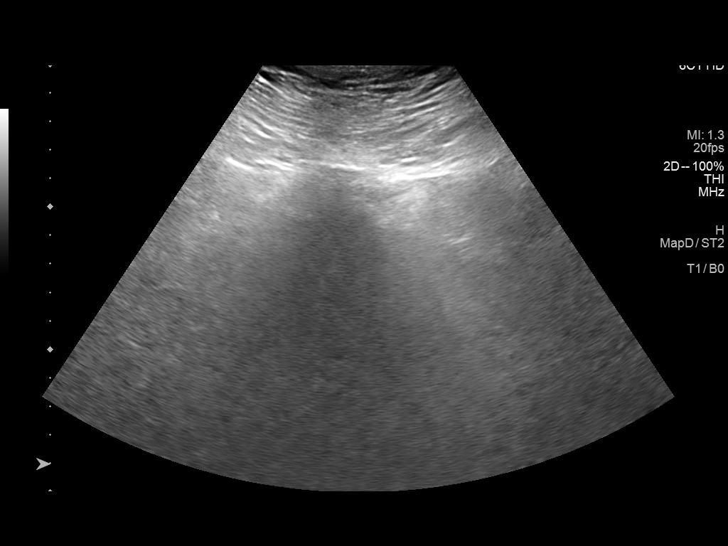

[14 of 19 positions shown; findings below may reference images not displayed]

FINDINGS: Right Kidney:

Renal measurements: 7.9 x 3.7 x 4.4 cm = volume: 68.4 mL. There is
diffuse increased echotexture of the right kidney. No mass or
hydronephrosis visualized.

Left Kidney:

Not visualized.  Due to patient body habitus.

Bladder:

Not visualized.  Due to patient body habitus.

Other:

None.
IMPRESSION: Limited exam as described. Diffuse increased echotexture of the
right kidney consistent with medical renal disease.

## 2022-12-14 DIAGNOSIS — Z992 Dependence on renal dialysis: Secondary | ICD-10-CM | POA: Diagnosis not present

## 2022-12-14 DIAGNOSIS — N186 End stage renal disease: Secondary | ICD-10-CM | POA: Diagnosis not present

## 2022-12-14 DIAGNOSIS — N2581 Secondary hyperparathyroidism of renal origin: Secondary | ICD-10-CM | POA: Diagnosis not present

## 2022-12-17 DIAGNOSIS — N2581 Secondary hyperparathyroidism of renal origin: Secondary | ICD-10-CM | POA: Diagnosis not present

## 2022-12-17 DIAGNOSIS — N186 End stage renal disease: Secondary | ICD-10-CM | POA: Diagnosis not present

## 2022-12-17 DIAGNOSIS — Z992 Dependence on renal dialysis: Secondary | ICD-10-CM | POA: Diagnosis not present

## 2022-12-19 DIAGNOSIS — N2581 Secondary hyperparathyroidism of renal origin: Secondary | ICD-10-CM | POA: Diagnosis not present

## 2022-12-19 DIAGNOSIS — N186 End stage renal disease: Secondary | ICD-10-CM | POA: Diagnosis not present

## 2022-12-19 DIAGNOSIS — Z992 Dependence on renal dialysis: Secondary | ICD-10-CM | POA: Diagnosis not present

## 2022-12-21 DIAGNOSIS — Z992 Dependence on renal dialysis: Secondary | ICD-10-CM | POA: Diagnosis not present

## 2022-12-21 DIAGNOSIS — N2581 Secondary hyperparathyroidism of renal origin: Secondary | ICD-10-CM | POA: Diagnosis not present

## 2022-12-21 DIAGNOSIS — N186 End stage renal disease: Secondary | ICD-10-CM | POA: Diagnosis not present

## 2022-12-24 DIAGNOSIS — Z992 Dependence on renal dialysis: Secondary | ICD-10-CM | POA: Diagnosis not present

## 2022-12-24 DIAGNOSIS — N186 End stage renal disease: Secondary | ICD-10-CM | POA: Diagnosis not present

## 2022-12-24 DIAGNOSIS — N2581 Secondary hyperparathyroidism of renal origin: Secondary | ICD-10-CM | POA: Diagnosis not present

## 2022-12-25 DIAGNOSIS — Z992 Dependence on renal dialysis: Secondary | ICD-10-CM | POA: Diagnosis not present

## 2022-12-25 DIAGNOSIS — E1122 Type 2 diabetes mellitus with diabetic chronic kidney disease: Secondary | ICD-10-CM | POA: Diagnosis not present

## 2022-12-25 DIAGNOSIS — N186 End stage renal disease: Secondary | ICD-10-CM | POA: Diagnosis not present

## 2022-12-26 DIAGNOSIS — Z992 Dependence on renal dialysis: Secondary | ICD-10-CM | POA: Diagnosis not present

## 2022-12-26 DIAGNOSIS — N2581 Secondary hyperparathyroidism of renal origin: Secondary | ICD-10-CM | POA: Diagnosis not present

## 2022-12-26 DIAGNOSIS — N186 End stage renal disease: Secondary | ICD-10-CM | POA: Diagnosis not present

## 2022-12-28 DIAGNOSIS — N2581 Secondary hyperparathyroidism of renal origin: Secondary | ICD-10-CM | POA: Diagnosis not present

## 2022-12-28 DIAGNOSIS — N186 End stage renal disease: Secondary | ICD-10-CM | POA: Diagnosis not present

## 2022-12-28 DIAGNOSIS — Z992 Dependence on renal dialysis: Secondary | ICD-10-CM | POA: Diagnosis not present

## 2022-12-31 DIAGNOSIS — Z992 Dependence on renal dialysis: Secondary | ICD-10-CM | POA: Diagnosis not present

## 2022-12-31 DIAGNOSIS — N2581 Secondary hyperparathyroidism of renal origin: Secondary | ICD-10-CM | POA: Diagnosis not present

## 2022-12-31 DIAGNOSIS — N186 End stage renal disease: Secondary | ICD-10-CM | POA: Diagnosis not present

## 2023-01-02 DIAGNOSIS — N186 End stage renal disease: Secondary | ICD-10-CM | POA: Diagnosis not present

## 2023-01-02 DIAGNOSIS — N2581 Secondary hyperparathyroidism of renal origin: Secondary | ICD-10-CM | POA: Diagnosis not present

## 2023-01-02 DIAGNOSIS — Z992 Dependence on renal dialysis: Secondary | ICD-10-CM | POA: Diagnosis not present

## 2023-01-04 DIAGNOSIS — Z992 Dependence on renal dialysis: Secondary | ICD-10-CM | POA: Diagnosis not present

## 2023-01-04 DIAGNOSIS — N186 End stage renal disease: Secondary | ICD-10-CM | POA: Diagnosis not present

## 2023-01-04 DIAGNOSIS — N2581 Secondary hyperparathyroidism of renal origin: Secondary | ICD-10-CM | POA: Diagnosis not present

## 2023-01-07 DIAGNOSIS — Z992 Dependence on renal dialysis: Secondary | ICD-10-CM | POA: Diagnosis not present

## 2023-01-07 DIAGNOSIS — N2581 Secondary hyperparathyroidism of renal origin: Secondary | ICD-10-CM | POA: Diagnosis not present

## 2023-01-07 DIAGNOSIS — N186 End stage renal disease: Secondary | ICD-10-CM | POA: Diagnosis not present

## 2023-01-09 DIAGNOSIS — N186 End stage renal disease: Secondary | ICD-10-CM | POA: Diagnosis not present

## 2023-01-09 DIAGNOSIS — Z992 Dependence on renal dialysis: Secondary | ICD-10-CM | POA: Diagnosis not present

## 2023-01-09 DIAGNOSIS — N2581 Secondary hyperparathyroidism of renal origin: Secondary | ICD-10-CM | POA: Diagnosis not present

## 2023-01-11 DIAGNOSIS — N2581 Secondary hyperparathyroidism of renal origin: Secondary | ICD-10-CM | POA: Diagnosis not present

## 2023-01-11 DIAGNOSIS — N186 End stage renal disease: Secondary | ICD-10-CM | POA: Diagnosis not present

## 2023-01-11 DIAGNOSIS — Z992 Dependence on renal dialysis: Secondary | ICD-10-CM | POA: Diagnosis not present

## 2023-01-13 DIAGNOSIS — H40021 Open angle with borderline findings, high risk, right eye: Secondary | ICD-10-CM | POA: Diagnosis not present

## 2023-01-13 DIAGNOSIS — H401123 Primary open-angle glaucoma, left eye, severe stage: Secondary | ICD-10-CM | POA: Diagnosis not present

## 2023-01-14 DIAGNOSIS — N2581 Secondary hyperparathyroidism of renal origin: Secondary | ICD-10-CM | POA: Diagnosis not present

## 2023-01-14 DIAGNOSIS — N186 End stage renal disease: Secondary | ICD-10-CM | POA: Diagnosis not present

## 2023-01-14 DIAGNOSIS — Z992 Dependence on renal dialysis: Secondary | ICD-10-CM | POA: Diagnosis not present

## 2023-01-16 DIAGNOSIS — N186 End stage renal disease: Secondary | ICD-10-CM | POA: Diagnosis not present

## 2023-01-16 DIAGNOSIS — Z992 Dependence on renal dialysis: Secondary | ICD-10-CM | POA: Diagnosis not present

## 2023-01-16 DIAGNOSIS — N2581 Secondary hyperparathyroidism of renal origin: Secondary | ICD-10-CM | POA: Diagnosis not present

## 2023-01-18 DIAGNOSIS — Z992 Dependence on renal dialysis: Secondary | ICD-10-CM | POA: Diagnosis not present

## 2023-01-18 DIAGNOSIS — N186 End stage renal disease: Secondary | ICD-10-CM | POA: Diagnosis not present

## 2023-01-18 DIAGNOSIS — N2581 Secondary hyperparathyroidism of renal origin: Secondary | ICD-10-CM | POA: Diagnosis not present

## 2023-01-21 DIAGNOSIS — Z992 Dependence on renal dialysis: Secondary | ICD-10-CM | POA: Diagnosis not present

## 2023-01-21 DIAGNOSIS — N186 End stage renal disease: Secondary | ICD-10-CM | POA: Diagnosis not present

## 2023-01-21 DIAGNOSIS — N2581 Secondary hyperparathyroidism of renal origin: Secondary | ICD-10-CM | POA: Diagnosis not present

## 2023-01-23 DIAGNOSIS — N186 End stage renal disease: Secondary | ICD-10-CM | POA: Diagnosis not present

## 2023-01-23 DIAGNOSIS — N2581 Secondary hyperparathyroidism of renal origin: Secondary | ICD-10-CM | POA: Diagnosis not present

## 2023-01-23 DIAGNOSIS — Z992 Dependence on renal dialysis: Secondary | ICD-10-CM | POA: Diagnosis not present

## 2023-01-25 DIAGNOSIS — E1122 Type 2 diabetes mellitus with diabetic chronic kidney disease: Secondary | ICD-10-CM | POA: Diagnosis not present

## 2023-01-25 DIAGNOSIS — N2581 Secondary hyperparathyroidism of renal origin: Secondary | ICD-10-CM | POA: Diagnosis not present

## 2023-01-25 DIAGNOSIS — Z992 Dependence on renal dialysis: Secondary | ICD-10-CM | POA: Diagnosis not present

## 2023-01-25 DIAGNOSIS — N186 End stage renal disease: Secondary | ICD-10-CM | POA: Diagnosis not present

## 2023-01-28 DIAGNOSIS — N186 End stage renal disease: Secondary | ICD-10-CM | POA: Diagnosis not present

## 2023-01-28 DIAGNOSIS — Z992 Dependence on renal dialysis: Secondary | ICD-10-CM | POA: Diagnosis not present

## 2023-01-28 DIAGNOSIS — N2581 Secondary hyperparathyroidism of renal origin: Secondary | ICD-10-CM | POA: Diagnosis not present

## 2023-01-30 DIAGNOSIS — N186 End stage renal disease: Secondary | ICD-10-CM | POA: Diagnosis not present

## 2023-01-30 DIAGNOSIS — N2581 Secondary hyperparathyroidism of renal origin: Secondary | ICD-10-CM | POA: Diagnosis not present

## 2023-01-30 DIAGNOSIS — Z992 Dependence on renal dialysis: Secondary | ICD-10-CM | POA: Diagnosis not present

## 2023-02-01 DIAGNOSIS — N2581 Secondary hyperparathyroidism of renal origin: Secondary | ICD-10-CM | POA: Diagnosis not present

## 2023-02-01 DIAGNOSIS — N186 End stage renal disease: Secondary | ICD-10-CM | POA: Diagnosis not present

## 2023-02-01 DIAGNOSIS — Z992 Dependence on renal dialysis: Secondary | ICD-10-CM | POA: Diagnosis not present

## 2023-02-04 DIAGNOSIS — Z992 Dependence on renal dialysis: Secondary | ICD-10-CM | POA: Diagnosis not present

## 2023-02-04 DIAGNOSIS — N186 End stage renal disease: Secondary | ICD-10-CM | POA: Diagnosis not present

## 2023-02-04 DIAGNOSIS — N2581 Secondary hyperparathyroidism of renal origin: Secondary | ICD-10-CM | POA: Diagnosis not present

## 2023-02-06 DIAGNOSIS — N2581 Secondary hyperparathyroidism of renal origin: Secondary | ICD-10-CM | POA: Diagnosis not present

## 2023-02-06 DIAGNOSIS — N186 End stage renal disease: Secondary | ICD-10-CM | POA: Diagnosis not present

## 2023-02-06 DIAGNOSIS — Z992 Dependence on renal dialysis: Secondary | ICD-10-CM | POA: Diagnosis not present

## 2023-02-08 DIAGNOSIS — N186 End stage renal disease: Secondary | ICD-10-CM | POA: Diagnosis not present

## 2023-02-08 DIAGNOSIS — Z992 Dependence on renal dialysis: Secondary | ICD-10-CM | POA: Diagnosis not present

## 2023-02-08 DIAGNOSIS — N2581 Secondary hyperparathyroidism of renal origin: Secondary | ICD-10-CM | POA: Diagnosis not present

## 2023-02-11 DIAGNOSIS — N2581 Secondary hyperparathyroidism of renal origin: Secondary | ICD-10-CM | POA: Diagnosis not present

## 2023-02-11 DIAGNOSIS — N186 End stage renal disease: Secondary | ICD-10-CM | POA: Diagnosis not present

## 2023-02-11 DIAGNOSIS — Z992 Dependence on renal dialysis: Secondary | ICD-10-CM | POA: Diagnosis not present

## 2023-02-13 DIAGNOSIS — N186 End stage renal disease: Secondary | ICD-10-CM | POA: Diagnosis not present

## 2023-02-13 DIAGNOSIS — Z992 Dependence on renal dialysis: Secondary | ICD-10-CM | POA: Diagnosis not present

## 2023-02-13 DIAGNOSIS — N2581 Secondary hyperparathyroidism of renal origin: Secondary | ICD-10-CM | POA: Diagnosis not present

## 2023-02-15 DIAGNOSIS — N186 End stage renal disease: Secondary | ICD-10-CM | POA: Diagnosis not present

## 2023-02-15 DIAGNOSIS — N2581 Secondary hyperparathyroidism of renal origin: Secondary | ICD-10-CM | POA: Diagnosis not present

## 2023-02-15 DIAGNOSIS — Z992 Dependence on renal dialysis: Secondary | ICD-10-CM | POA: Diagnosis not present

## 2023-02-18 DIAGNOSIS — N186 End stage renal disease: Secondary | ICD-10-CM | POA: Diagnosis not present

## 2023-02-18 DIAGNOSIS — Z992 Dependence on renal dialysis: Secondary | ICD-10-CM | POA: Diagnosis not present

## 2023-02-18 DIAGNOSIS — N2581 Secondary hyperparathyroidism of renal origin: Secondary | ICD-10-CM | POA: Diagnosis not present

## 2023-02-20 DIAGNOSIS — N2581 Secondary hyperparathyroidism of renal origin: Secondary | ICD-10-CM | POA: Diagnosis not present

## 2023-02-20 DIAGNOSIS — Z992 Dependence on renal dialysis: Secondary | ICD-10-CM | POA: Diagnosis not present

## 2023-02-20 DIAGNOSIS — N186 End stage renal disease: Secondary | ICD-10-CM | POA: Diagnosis not present

## 2023-02-22 DIAGNOSIS — N2581 Secondary hyperparathyroidism of renal origin: Secondary | ICD-10-CM | POA: Diagnosis not present

## 2023-02-22 DIAGNOSIS — Z992 Dependence on renal dialysis: Secondary | ICD-10-CM | POA: Diagnosis not present

## 2023-02-22 DIAGNOSIS — N186 End stage renal disease: Secondary | ICD-10-CM | POA: Diagnosis not present

## 2023-02-24 DIAGNOSIS — Z992 Dependence on renal dialysis: Secondary | ICD-10-CM | POA: Diagnosis not present

## 2023-02-24 DIAGNOSIS — E1122 Type 2 diabetes mellitus with diabetic chronic kidney disease: Secondary | ICD-10-CM | POA: Diagnosis not present

## 2023-02-24 DIAGNOSIS — N186 End stage renal disease: Secondary | ICD-10-CM | POA: Diagnosis not present

## 2023-02-25 DIAGNOSIS — N186 End stage renal disease: Secondary | ICD-10-CM | POA: Diagnosis not present

## 2023-02-25 DIAGNOSIS — Z992 Dependence on renal dialysis: Secondary | ICD-10-CM | POA: Diagnosis not present

## 2023-02-25 DIAGNOSIS — N2581 Secondary hyperparathyroidism of renal origin: Secondary | ICD-10-CM | POA: Diagnosis not present

## 2023-02-27 DIAGNOSIS — Z992 Dependence on renal dialysis: Secondary | ICD-10-CM | POA: Diagnosis not present

## 2023-02-27 DIAGNOSIS — N186 End stage renal disease: Secondary | ICD-10-CM | POA: Diagnosis not present

## 2023-02-27 DIAGNOSIS — N2581 Secondary hyperparathyroidism of renal origin: Secondary | ICD-10-CM | POA: Diagnosis not present

## 2023-03-01 DIAGNOSIS — N2581 Secondary hyperparathyroidism of renal origin: Secondary | ICD-10-CM | POA: Diagnosis not present

## 2023-03-01 DIAGNOSIS — Z992 Dependence on renal dialysis: Secondary | ICD-10-CM | POA: Diagnosis not present

## 2023-03-01 DIAGNOSIS — N186 End stage renal disease: Secondary | ICD-10-CM | POA: Diagnosis not present

## 2023-03-03 ENCOUNTER — Ambulatory Visit: Payer: Medicare HMO | Admitting: Family Medicine

## 2023-03-04 DIAGNOSIS — Z992 Dependence on renal dialysis: Secondary | ICD-10-CM | POA: Diagnosis not present

## 2023-03-04 DIAGNOSIS — N2581 Secondary hyperparathyroidism of renal origin: Secondary | ICD-10-CM | POA: Diagnosis not present

## 2023-03-04 DIAGNOSIS — N186 End stage renal disease: Secondary | ICD-10-CM | POA: Diagnosis not present

## 2023-03-06 DIAGNOSIS — Z992 Dependence on renal dialysis: Secondary | ICD-10-CM | POA: Diagnosis not present

## 2023-03-06 DIAGNOSIS — N186 End stage renal disease: Secondary | ICD-10-CM | POA: Diagnosis not present

## 2023-03-06 DIAGNOSIS — N2581 Secondary hyperparathyroidism of renal origin: Secondary | ICD-10-CM | POA: Diagnosis not present

## 2023-03-08 DIAGNOSIS — N186 End stage renal disease: Secondary | ICD-10-CM | POA: Diagnosis not present

## 2023-03-08 DIAGNOSIS — N2581 Secondary hyperparathyroidism of renal origin: Secondary | ICD-10-CM | POA: Diagnosis not present

## 2023-03-08 DIAGNOSIS — Z992 Dependence on renal dialysis: Secondary | ICD-10-CM | POA: Diagnosis not present

## 2023-03-11 DIAGNOSIS — Z992 Dependence on renal dialysis: Secondary | ICD-10-CM | POA: Diagnosis not present

## 2023-03-11 DIAGNOSIS — N2581 Secondary hyperparathyroidism of renal origin: Secondary | ICD-10-CM | POA: Diagnosis not present

## 2023-03-11 DIAGNOSIS — N186 End stage renal disease: Secondary | ICD-10-CM | POA: Diagnosis not present

## 2023-03-12 DIAGNOSIS — N400 Enlarged prostate without lower urinary tract symptoms: Secondary | ICD-10-CM | POA: Diagnosis not present

## 2023-03-12 DIAGNOSIS — R361 Hematospermia: Secondary | ICD-10-CM | POA: Diagnosis not present

## 2023-03-13 DIAGNOSIS — N186 End stage renal disease: Secondary | ICD-10-CM | POA: Diagnosis not present

## 2023-03-13 DIAGNOSIS — Z992 Dependence on renal dialysis: Secondary | ICD-10-CM | POA: Diagnosis not present

## 2023-03-13 DIAGNOSIS — N2581 Secondary hyperparathyroidism of renal origin: Secondary | ICD-10-CM | POA: Diagnosis not present

## 2023-03-15 DIAGNOSIS — N186 End stage renal disease: Secondary | ICD-10-CM | POA: Diagnosis not present

## 2023-03-15 DIAGNOSIS — N2581 Secondary hyperparathyroidism of renal origin: Secondary | ICD-10-CM | POA: Diagnosis not present

## 2023-03-15 DIAGNOSIS — Z992 Dependence on renal dialysis: Secondary | ICD-10-CM | POA: Diagnosis not present

## 2023-03-18 DIAGNOSIS — Z992 Dependence on renal dialysis: Secondary | ICD-10-CM | POA: Diagnosis not present

## 2023-03-18 DIAGNOSIS — N2581 Secondary hyperparathyroidism of renal origin: Secondary | ICD-10-CM | POA: Diagnosis not present

## 2023-03-18 DIAGNOSIS — N186 End stage renal disease: Secondary | ICD-10-CM | POA: Diagnosis not present

## 2023-03-19 ENCOUNTER — Other Ambulatory Visit: Payer: Self-pay

## 2023-03-19 ENCOUNTER — Ambulatory Visit (HOSPITAL_COMMUNITY)
Admission: RE | Admit: 2023-03-19 | Discharge: 2023-03-19 | Disposition: A | Discharge status: Home / Self Care | Payer: Medicare HMO | Source: Ambulatory Visit | Attending: Vascular Surgery | Admitting: Vascular Surgery

## 2023-03-19 ENCOUNTER — Ambulatory Visit (INDEPENDENT_AMBULATORY_CARE_PROVIDER_SITE_OTHER): Payer: Medicare HMO

## 2023-03-19 VITALS — BP 138/85 | HR 72 | Temp 98.1°F | Ht 72.0 in | Wt 320.0 lb

## 2023-03-19 DIAGNOSIS — Z992 Dependence on renal dialysis: Secondary | ICD-10-CM | POA: Diagnosis not present

## 2023-03-19 DIAGNOSIS — N186 End stage renal disease: Secondary | ICD-10-CM | POA: Insufficient documentation

## 2023-03-19 NOTE — Progress Notes (Signed)
[facility] [place] HISTORY AND PHYSICAL   History of Present Illness:  Patient is a 62 y.o. year old male who presents for examination of his right AV fistula.  He reports that he is not really sure why it is malfunctioning.  He states he has had the fistula for approximately 4 years.  [facility] has performed fistulagrams in the past.    He denies pain. Loss of motor or loss of sensation in the right UE.      Past Medical History:  Diagnosis Date   Arthritis    "back" (02/06/2017)   CHF (congestive heart failure) (HCC)    new onset /Encounter Date 09/12/2006   Chronic combined systolic and diastolic heart failure (HCC)    CKD (chronic kidney disease) stage 4, GFR 15-29 ml/min (HCC)    COPD (chronic obstructive pulmonary disease) (HCC)    End stage renal disease on dialysis [facility])    Tues/ Thur/ Sat [address]   Glaucoma, right eye    High cholesterol    Hypertension    Hypertrophy of tonsils alone    Myocardial infarction (HCC)    "small one; ~ 2008" (02/06/2017)   Obesity, unspecified    On home oxygen therapy    "4L; 24/7" (04/10/2017)   OSA on CPAP    Type II diabetes mellitus (HCC)     Past Surgical History:  Procedure Laterality Date   AV FISTULA PLACEMENT Right 07/01/2019   Procedure: Right arm arteriovenous fistual;  Surgeon: Maeola Harman, MD;  Location: [facility] OR;  Service: Vascular;  Laterality: Right;   FISTULA SUPERFICIALIZATION Right 09/01/2019   Procedure: RIGHT UPPER ARM FISTULA  TRANSPOSITION OF RIGHT UPPPER ARM BRACHIAL CEPHALIC FISTULA;  Surgeon: Maeola Harman, MD;  Location: [facility] OR;  Service: Vascular;  Laterality: Right;   HERNIA REPAIR     INSERTION OF DIALYSIS CATHETER Right 07/01/2019   Procedure: INSERTION OF TUNNELED DIALYSIS CATHETER;  Surgeon: Maeola Harman, MD;  Location: [facility] OR;  Service: Vascular;  Laterality: Right;   TEE WITHOUT CARDIOVERSION N/A 04/16/2017   Procedure: TRANSESOPHAGEAL  ECHOCARDIOGRAM (TEE);  Surgeon: Chrystie Nose, MD;  Location: [facility] ENDOSCOPY;  Service: Cardiovascular;  Laterality: N/A;   UMBILICAL HERNIA REPAIR  1960s   "when I was little baby"     Social History Social History   Tobacco Use   Smoking status: Never   Smokeless tobacco: Never  Vaping Use   Vaping status: Never Used  Substance Use Topics   Alcohol use: Yes    Comment: occasional   Drug use: No    Family History Family History  Problem Relation Age of Onset   Hypertension Mother    Diabetes Sister    Colon cancer Sister    Diabetes Brother    Cancer Maternal Aunt    Amblyopia Neg Hx    Blindness Neg Hx    Cataracts Neg Hx    Glaucoma Neg Hx    Macular degeneration Neg Hx    Strabismus Neg Hx    Retinal detachment Neg Hx    Retinitis pigmentosa Neg Hx     Allergies  No Known Allergies   Current Outpatient Medications  Medication Sig Dispense Refill   ACCU-CHEK SOFTCLIX LANCETS lancets Use as instructed to check blood sugar up to 3 times daily. 100 each 11   amLODipine (NORVASC) 5 MG tablet Take 1 tablet (5 mg total) by mouth daily. 90 tablet 1   aspirin EC 81 MG tablet  Take 81 mg by mouth daily.     atorvastatin (LIPITOR) 40 MG tablet TAKE 1 TABLET(40 MG) BY MOUTH DAILY 90 tablet 1   Blood Glucose Monitoring Suppl (ACCU-CHEK AVIVA) device Use as instructed to check blood sugar up to 3 times daily. 1 each 0   dorzolamide-timolol (COSOPT) 22.3-6.8 MG/ML ophthalmic solution Place 1 drop into both eyes 2 (two) times daily.     FEROSUL 325 (65 Fe) MG tablet TAKE 1 TABLET(325 MG) BY MOUTH DAILY WITH BREAKFAST (Patient taking differently: Take 325 mg by mouth Every Tuesday,Thursday,and Saturday with dialysis.) 100 tablet 3   glucose blood (ACCU-CHEK AVIVA PLUS) test strip Use as instructed to check blood sugar up to 3 times daily. 100 each 11   Lancet Devices (ACCU-CHEK SOFTCLIX) lancets Use as instructed daily. 1 each 5   methocarbamol (ROBAXIN) 500 MG tablet Take 1  tablet (500 mg total) by mouth every 8 (eight) hours as needed for muscle spasms. 180 tablet 1   nitroGLYCERIN (NITROSTAT) 0.4 MG SL tablet PLACE 1 TABLET BY MOUTH UNDER TONGUE EVERY 5 MINUTES FOR 3 DOSES AS NEEDED FOR CHEST PAIN (Patient taking differently: Place 0.4 mg under the tongue every 5 (five) minutes as needed for chest pain.) 25 tablet 0   Semaglutide, 1 MG/DOSE, 4 MG/3ML SOPN Inject 1 mg as directed once a week. 3 mL 6   sevelamer carbonate (RENVELA) 800 MG tablet Take 1 tablet (800 mg total) by mouth as needed (with snacks). 90 tablet 1   TRAVATAN Z 0.004 % SOLN ophthalmic solution Place 1 drop into both eyes at bedtime.  12   No current facility-administered medications for this visit.    ROS:   General:  No weight loss, Fever, chills  HEENT: No recent headaches, no nasal bleeding, no visual changes, no sore throat  Neurologic: No dizziness, blackouts, seizures. No recent symptoms of stroke or mini- stroke. No recent episodes of slurred speech, or temporary blindness.  Cardiac: No recent episodes of chest pain/pressure, no shortness of breath at rest.  No shortness of breath with exertion.  Denies history of atrial fibrillation or irregular heartbeat  Vascular: No history of rest pain in feet.  No history of claudication.  No history of non-healing ulcer, No history of DVT   Pulmonary: No home oxygen, no productive cough, no hemoptysis,  No asthma or wheezing  Musculoskeletal:  [ ]  Arthritis, [ ]  Low back pain,  [ ]  Joint pain  Hematologic:No history of hypercoagulable state.  No history of easy bleeding.  No history of anemia  Gastrointestinal: No hematochezia or melena,  No gastroesophageal reflux, no trouble swallowing  Urinary: [ ]  chronic Kidney disease, [ ]  on HD - [ ]  MWF or [ ]  TTHS, [ ]  Burning with urination, [ ]  Frequent urination, [ ]  Difficulty urinating;   Skin: No rashes  Psychological: No history of anxiety,  No history of depression   Physical  Examination  Vitals:   03/19/23 1425  BP: 138/85  Pulse: 72  Temp: 98.1 F (36.7 C)  SpO2: 90%  Weight: (!) 320 lb (145.2 kg)  Height: 6' (1.829 m)    Body mass index is 43.4 kg/m.  General:  Alert and oriented, no acute distress HEENT: Normal Neck: No bruit or JVD Pulmonary: Clear to auscultation bilaterally Cardiac: Regular Rate and Rhythm without murmur Gastrointestinal: Soft, non-tender, non-distended, no mass, no scars Skin: No rash Extremity Pulses:  2+ radial, brachial pulses bilaterally, the fistula is pulsatile to palpation Musculoskeletal:  No deformity or edema  Neurologic: Upper and lower extremity motor 5/5 and symmetric  DATA:  +--------------------+----------+-----------------+--------+  AVF                PSV (cm/s)Flow Vol (mL/min)Comments  +--------------------+----------+-----------------+--------+  Native artery inflow   123          2045                 +--------------------+----------+-----------------+--------+  AVF Anastomosis        261                               +--------------------+----------+-----------------+--------+     +---------------+----------+-------------+----------+--------+  OUTFLOW VEIN   PSV (cm/s)Diameter (cm)Depth (cm)Describe  +---------------+----------+-------------+----------+--------+  Subclavian vein   348        1.50        1.24             +---------------+----------+-------------+----------+--------+  Shoulder         150        0.73        0.61             +---------------+----------+-------------+----------+--------+  Prox UA           246        0.78        0.77             +---------------+----------+-------------+----------+--------+  Mid UA            117        2.90        0.69             +---------------+----------+-------------+----------+--------+  Dist UA           221        2.27        2.60              +---------------+----------+-------------+----------+--------+        Summary:  Patent enlarged aneurysmal, tortuous arterio-venous fistula.   ASSESSMENT/PLAN:  ESRD with malfunctioning right UE AV fistula The fistula has aneurysmal areas without ulcers or prolonged bleeding.   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  I will schedule him for fistulasgram and possible intervention.  He has HD TTS.  He is not currently on anticoagulation.  He prefers a Wednesday for the procedure if possible.  He agrees to proceed.         Mosetta Pigeon PA-C [facility] [place] Office: [phone] Pager: [phone]

## 2023-03-20 ENCOUNTER — Telehealth: Payer: Self-pay

## 2023-03-20 DIAGNOSIS — N2581 Secondary hyperparathyroidism of renal origin: Secondary | ICD-10-CM | POA: Diagnosis not present

## 2023-03-20 DIAGNOSIS — N186 End stage renal disease: Secondary | ICD-10-CM | POA: Diagnosis not present

## 2023-03-20 DIAGNOSIS — Z992 Dependence on renal dialysis: Secondary | ICD-10-CM | POA: Diagnosis not present

## 2023-03-20 NOTE — Telephone Encounter (Signed)
Attempted to reach patient to schedule fistulogram, no answer. Left VM for patient to return call.

## 2023-03-22 DIAGNOSIS — N2581 Secondary hyperparathyroidism of renal origin: Secondary | ICD-10-CM | POA: Diagnosis not present

## 2023-03-22 DIAGNOSIS — N186 End stage renal disease: Secondary | ICD-10-CM | POA: Diagnosis not present

## 2023-03-22 DIAGNOSIS — Z992 Dependence on renal dialysis: Secondary | ICD-10-CM | POA: Diagnosis not present

## 2023-03-25 DIAGNOSIS — N186 End stage renal disease: Secondary | ICD-10-CM | POA: Diagnosis not present

## 2023-03-25 DIAGNOSIS — Z992 Dependence on renal dialysis: Secondary | ICD-10-CM | POA: Diagnosis not present

## 2023-03-25 DIAGNOSIS — N2581 Secondary hyperparathyroidism of renal origin: Secondary | ICD-10-CM | POA: Diagnosis not present

## 2023-03-27 ENCOUNTER — Other Ambulatory Visit: Payer: Self-pay | Admitting: Family Medicine

## 2023-03-27 DIAGNOSIS — E1122 Type 2 diabetes mellitus with diabetic chronic kidney disease: Secondary | ICD-10-CM

## 2023-03-27 DIAGNOSIS — N2581 Secondary hyperparathyroidism of renal origin: Secondary | ICD-10-CM | POA: Diagnosis not present

## 2023-03-27 DIAGNOSIS — N186 End stage renal disease: Secondary | ICD-10-CM | POA: Diagnosis not present

## 2023-03-27 DIAGNOSIS — Z992 Dependence on renal dialysis: Secondary | ICD-10-CM | POA: Diagnosis not present

## 2023-03-27 NOTE — Telephone Encounter (Signed)
Requested Prescriptions  Pending Prescriptions Disp Refills   Semaglutide, 1 MG/DOSE, (OZEMPIC, 1 MG/DOSE,) 4 MG/3ML SOPN [Pharmacy Med Name: OZEMPIC 1MG  PER DOSE (4MG /3ML) PFP] 3 mL 2    Sig: INJECT 1 MG AS DIRECTED ONCE A WEEK     Endocrinology:  Diabetes - GLP-1 Receptor Agonists - semaglutide Failed - 03/27/2023  5:18 PM      Failed - HBA1C in normal range and within 180 days    HbA1c, POC (controlled diabetic range)  Date Value Ref Range Status  08/28/2022 6.0 0.0 - 7.0 % Final         Failed - Cr in normal range and within 360 days    Creat  Date Value Ref Range Status  11/30/2015 2.89 (H) 0.70 - 1.33 mg/dL Corrected    Comment:      For patients > or = 61 years of age: The upper reference limit for Creatinine is approximately 13% higher for people identified as African-American.     Amended report. Called amended report to [employer] 12/04/15 1425 FOSTN.    Creatinine, Ser  Date Value Ref Range Status  01/26/2022 9.99 (H) 0.61 - 1.24 mg/dL Final   Creatinine, Urine  Date Value Ref Range Status  04/10/2017 67.52 mg/dL Final         Failed - Valid encounter within last 6 months    Recent Outpatient Visits           7 months ago Type 2 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, without long-term current use of insulin (HCC)   [facility] Hoy Register, MD   12 months ago Encounter for [employer] annual wellness exam   [facility] Hoy Register, MD   1 year ago Other emphysema [facility])   [facility] Hoy Register, MD   1 year ago Hospital discharge follow-up   [facility] Grayce Sessions, NP   1 year ago Type 2 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, without long-term current use of insulin [facility])   [facility] Hoy Register, MD        Future Appointments             In 2 months Hoy Register, MD [facility]

## 2023-03-29 DIAGNOSIS — N186 End stage renal disease: Secondary | ICD-10-CM | POA: Diagnosis not present

## 2023-03-29 DIAGNOSIS — Z992 Dependence on renal dialysis: Secondary | ICD-10-CM | POA: Diagnosis not present

## 2023-03-29 DIAGNOSIS — N2581 Secondary hyperparathyroidism of renal origin: Secondary | ICD-10-CM | POA: Diagnosis not present

## 2023-04-01 DIAGNOSIS — Z992 Dependence on renal dialysis: Secondary | ICD-10-CM | POA: Diagnosis not present

## 2023-04-01 DIAGNOSIS — N2581 Secondary hyperparathyroidism of renal origin: Secondary | ICD-10-CM | POA: Diagnosis not present

## 2023-04-01 DIAGNOSIS — N186 End stage renal disease: Secondary | ICD-10-CM | POA: Diagnosis not present

## 2023-04-03 DIAGNOSIS — N186 End stage renal disease: Secondary | ICD-10-CM | POA: Diagnosis not present

## 2023-04-03 DIAGNOSIS — N2581 Secondary hyperparathyroidism of renal origin: Secondary | ICD-10-CM | POA: Diagnosis not present

## 2023-04-03 DIAGNOSIS — Z992 Dependence on renal dialysis: Secondary | ICD-10-CM | POA: Diagnosis not present

## 2023-04-05 DIAGNOSIS — N2581 Secondary hyperparathyroidism of renal origin: Secondary | ICD-10-CM | POA: Diagnosis not present

## 2023-04-05 DIAGNOSIS — N186 End stage renal disease: Secondary | ICD-10-CM | POA: Diagnosis not present

## 2023-04-05 DIAGNOSIS — Z992 Dependence on renal dialysis: Secondary | ICD-10-CM | POA: Diagnosis not present

## 2023-04-08 DIAGNOSIS — Z992 Dependence on renal dialysis: Secondary | ICD-10-CM | POA: Diagnosis not present

## 2023-04-08 DIAGNOSIS — N2581 Secondary hyperparathyroidism of renal origin: Secondary | ICD-10-CM | POA: Diagnosis not present

## 2023-04-08 DIAGNOSIS — N186 End stage renal disease: Secondary | ICD-10-CM | POA: Diagnosis not present

## 2023-04-10 DIAGNOSIS — N186 End stage renal disease: Secondary | ICD-10-CM | POA: Diagnosis not present

## 2023-04-10 DIAGNOSIS — Z992 Dependence on renal dialysis: Secondary | ICD-10-CM | POA: Diagnosis not present

## 2023-04-10 DIAGNOSIS — N2581 Secondary hyperparathyroidism of renal origin: Secondary | ICD-10-CM | POA: Diagnosis not present

## 2023-04-12 DIAGNOSIS — N2581 Secondary hyperparathyroidism of renal origin: Secondary | ICD-10-CM | POA: Diagnosis not present

## 2023-04-12 DIAGNOSIS — N186 End stage renal disease: Secondary | ICD-10-CM | POA: Diagnosis not present

## 2023-04-12 DIAGNOSIS — Z992 Dependence on renal dialysis: Secondary | ICD-10-CM | POA: Diagnosis not present

## 2023-04-15 DIAGNOSIS — N2581 Secondary hyperparathyroidism of renal origin: Secondary | ICD-10-CM | POA: Diagnosis not present

## 2023-04-15 DIAGNOSIS — N186 End stage renal disease: Secondary | ICD-10-CM | POA: Diagnosis not present

## 2023-04-15 DIAGNOSIS — Z992 Dependence on renal dialysis: Secondary | ICD-10-CM | POA: Diagnosis not present

## 2023-04-17 ENCOUNTER — Encounter: Payer: Self-pay | Admitting: *Deleted

## 2023-04-17 ENCOUNTER — Other Ambulatory Visit: Payer: Self-pay | Admitting: *Deleted

## 2023-04-17 DIAGNOSIS — Z992 Dependence on renal dialysis: Secondary | ICD-10-CM | POA: Diagnosis not present

## 2023-04-17 DIAGNOSIS — N186 End stage renal disease: Secondary | ICD-10-CM | POA: Diagnosis not present

## 2023-04-17 DIAGNOSIS — N2581 Secondary hyperparathyroidism of renal origin: Secondary | ICD-10-CM | POA: Diagnosis not present

## 2023-04-17 MED ORDER — SODIUM CHLORIDE 0.9 % IV SOLN: 250.0000 mL | INTRAVENOUS | Status: AC | PRN

## 2023-04-17 MED ORDER — SODIUM CHLORIDE 0.9% FLUSH: 3.0000 mL | INTRAVENOUS | Status: AC | PRN

## 2023-04-17 MED ORDER — SODIUM CHLORIDE 0.9% FLUSH: 3.0000 mL | Freq: Two times a day (BID) | INTRAVENOUS | Status: AC

## 2023-04-19 DIAGNOSIS — N186 End stage renal disease: Secondary | ICD-10-CM | POA: Diagnosis not present

## 2023-04-19 DIAGNOSIS — N2581 Secondary hyperparathyroidism of renal origin: Secondary | ICD-10-CM | POA: Diagnosis not present

## 2023-04-19 DIAGNOSIS — Z992 Dependence on renal dialysis: Secondary | ICD-10-CM | POA: Diagnosis not present

## 2023-04-21 DIAGNOSIS — N186 End stage renal disease: Secondary | ICD-10-CM | POA: Diagnosis not present

## 2023-04-21 DIAGNOSIS — Z992 Dependence on renal dialysis: Secondary | ICD-10-CM | POA: Diagnosis not present

## 2023-04-21 DIAGNOSIS — N2581 Secondary hyperparathyroidism of renal origin: Secondary | ICD-10-CM | POA: Diagnosis not present

## 2023-04-22 ENCOUNTER — Ambulatory Visit: Payer: Medicare HMO | Attending: Family Medicine

## 2023-04-22 VITALS — Ht 72.0 in | Wt 314.4 lb

## 2023-04-22 DIAGNOSIS — Z0000 Encounter for general adult medical examination without abnormal findings: Secondary | ICD-10-CM | POA: Diagnosis not present

## 2023-04-22 NOTE — Patient Instructions (Signed)
Adam Dennis , Thank you for taking time to come for your Medicare Wellness Visit. I appreciate your ongoing commitment to your health goals. Please review the following plan we discussed and let me know if I can assist you in the future.   Referrals/Orders/Follow-Ups/Clinician Recommendations: No  This is a list of the screening recommended for you and due dates:  Health Maintenance  Topic Date Due   Eye exam for diabetics  12/13/2022   COVID-19 Vaccine (4 - 2023-24 season) 01/26/2023   Hemoglobin A1C  02/27/2023   Complete foot exam   04/02/2023   Medicare Annual Wellness Visit  04/21/2024   DTaP/Tdap/Td vaccine (2 - Td or Tdap) 06/25/2025   Colon Cancer Screening  11/06/2027   Flu Shot  Completed   Hepatitis C Screening  Completed   HIV Screening  Completed   Zoster (Shingles) Vaccine  Completed   HPV Vaccine  Aged Out    Advanced directives: (Declined) Advance directive discussed with you today. Even though you declined this today, please call our office should you change your mind, and we can give you the proper paperwork for you to fill out.  Next Medicare Annual Wellness Visit scheduled for next year: No

## 2023-04-22 NOTE — Progress Notes (Signed)
Subjective:   Adam Dennis is a 62 y.o. male who presents for Medicare Annual/Subsequent preventive examination.  Visit Complete: Virtual I connected with  Adam Dennis on 04/22/23 by a audio enabled telemedicine application and verified that I am speaking with the correct person using two identifiers.  Patient Location: Home  Provider Location: Home Office  I discussed the limitations of evaluation and management by telemedicine. The patient expressed understanding and agreed to proceed.  Vital Signs: Because this visit was a virtual/telehealth visit, some criteria may be missing or patient reported. Any vitals not documented were not able to be obtained and vitals that have been documented are patient reported.  Cardiac Risk Factors include: advanced age (>63men, >34 women);diabetes mellitus;male gender;family history of premature cardiovascular disease;hypertension;dyslipidemia;obesity (BMI >30kg/m2)     Objective:    Today's Vitals   04/22/23 0923  Weight: (!) 314 lb 6 oz (142.6 kg)  Height: 6' (1.829 m)  PainSc: 0-No pain   Body mass index is 42.64 kg/m.     04/22/2023    9:25 AM 04/01/2022    8:39 AM 01/22/2022    5:25 AM 01/21/2022    2:51 PM 01/21/2022    7:53 AM 11/10/2021    4:05 AM 02/19/2021    9:40 AM  Advanced Directives  Does Patient Have a Medical Advance Directive? No No No No No No No  Would patient like information on creating a medical advance directive? No - Patient declined Yes (ED - Information included in AVS) No - Patient declined No - Patient declined No - Patient declined No - Patient declined     Current Medications (verified) Outpatient Encounter Medications as of 04/22/2023  Medication Sig   ACCU-CHEK SOFTCLIX LANCETS lancets Use as instructed to check blood sugar up to 3 times daily.   amLODipine (NORVASC) 5 MG tablet Take 1 tablet (5 mg total) by mouth daily.   aspirin EC 81 MG tablet Take 81 mg by mouth daily.   atorvastatin  (LIPITOR) 40 MG tablet TAKE 1 TABLET(40 MG) BY MOUTH DAILY   Blood Glucose Monitoring Suppl (ACCU-CHEK AVIVA) device Use as instructed to check blood sugar up to 3 times daily.   dorzolamide-timolol (COSOPT) 22.3-6.8 MG/ML ophthalmic solution Place 1 drop into both eyes 2 (two) times daily.   FEROSUL 325 (65 Fe) MG tablet TAKE 1 TABLET(325 MG) BY MOUTH DAILY WITH BREAKFAST (Patient taking differently: Take 325 mg by mouth Every Tuesday,Thursday,and Saturday with dialysis.)   glucose blood (ACCU-CHEK AVIVA PLUS) test strip Use as instructed to check blood sugar up to 3 times daily.   Lancet Devices (ACCU-CHEK SOFTCLIX) lancets Use as instructed daily.   methocarbamol (ROBAXIN) 500 MG tablet Take 1 tablet (500 mg total) by mouth every 8 (eight) hours as needed for muscle spasms.   nitroGLYCERIN (NITROSTAT) 0.4 MG SL tablet PLACE 1 TABLET BY MOUTH UNDER TONGUE EVERY 5 MINUTES FOR 3 DOSES AS NEEDED FOR CHEST PAIN (Patient taking differently: Place 0.4 mg under the tongue every 5 (five) minutes as needed for chest pain.)   Semaglutide, 1 MG/DOSE, (OZEMPIC, 1 MG/DOSE,) 4 MG/3ML SOPN INJECT 1 MG AS DIRECTED ONCE A WEEK   sevelamer carbonate (RENVELA) 800 MG tablet Take 1 tablet (800 mg total) by mouth as needed (with snacks).   TRAVATAN Z 0.004 % SOLN ophthalmic solution Place 1 drop into both eyes at bedtime.   Facility-Administered Encounter Medications as of 04/22/2023  Medication   sodium chloride flush (NS) 0.9 % injection 3  mL   sodium chloride flush (NS) 0.9 % injection 3 mL    Allergies (verified) Patient has no known allergies.   History: Past Medical History:  Diagnosis Date   Arthritis    "back" (02/06/2017)   CHF (congestive heart failure) (HCC)    new onset /Encounter Date 09/12/2006   Chronic combined systolic and diastolic heart failure (HCC)    CKD (chronic kidney disease) stage 4, GFR 15-29 ml/min (HCC)    COPD (chronic obstructive pulmonary disease) (HCC)    End stage renal  disease on dialysis [facility]    Tues/ Thur/ Sat [address]   Glaucoma, right eye    High cholesterol    Hypertension    Hypertrophy of tonsils alone    Myocardial infarction (HCC)    "small one; ~ 2008" (02/06/2017)   Obesity, unspecified    On home oxygen therapy    "4L; 24/7" (04/10/2017)   OSA on CPAP    Type II diabetes mellitus (HCC)    Past Surgical History:  Procedure Laterality Date   AV FISTULA PLACEMENT Right 07/01/2019   Procedure: Right arm arteriovenous fistual;  Surgeon: Maeola Harman, MD;  Location: [facility] OR;  Service: Vascular;  Laterality: Right;   FISTULA SUPERFICIALIZATION Right 09/01/2019   Procedure: RIGHT UPPER ARM FISTULA  TRANSPOSITION OF RIGHT UPPPER ARM BRACHIAL CEPHALIC FISTULA;  Surgeon: Maeola Harman, MD;  Location: [facility] OR;  Service: Vascular;  Laterality: Right;   HERNIA REPAIR     INSERTION OF DIALYSIS CATHETER Right 07/01/2019   Procedure: INSERTION OF TUNNELED DIALYSIS CATHETER;  Surgeon: Maeola Harman, MD;  Location: [facility] OR;  Service: Vascular;  Laterality: Right;   TEE WITHOUT CARDIOVERSION N/A 04/16/2017   Procedure: TRANSESOPHAGEAL ECHOCARDIOGRAM (TEE);  Surgeon: Chrystie Nose, MD;  Location: [facility] ENDOSCOPY;  Service: Cardiovascular;  Laterality: N/A;   UMBILICAL HERNIA REPAIR  1960s   "when I was little baby"   Family History  Problem Relation Age of Onset   Hypertension Mother    Diabetes Sister    Colon cancer Sister    Diabetes Brother    Cancer Maternal Aunt    Amblyopia Neg Hx    Blindness Neg Hx    Cataracts Neg Hx    Glaucoma Neg Hx    Macular degeneration Neg Hx    Strabismus Neg Hx    Retinal detachment Neg Hx    Retinitis pigmentosa Neg Hx    Social History   Socioeconomic History   Marital status: Married    Spouse name: Not on file   Number of children: 0   Years of education: Not on file   Highest education level: Not on file  Occupational History   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  Tobacco  Use   Smoking status: Never   Smokeless tobacco: Never  Vaping Use   Vaping status: Never Used  Substance and Sexual Activity   Alcohol use: Yes    Comment: occasional   Drug use: No   Sexual activity: Not Currently  Other Topics Concern   Not on file  Social History Narrative   Not on file   Social Determinants of Health   Financial Resource Strain: Low Risk  (04/22/2023)   Overall Financial Resource Strain (CARDIA)    Difficulty of Paying Living Expenses: Not hard at all  Food Insecurity: No Food Insecurity (04/22/2023)   Hunger Vital Sign    Worried About Running Out of Food in the Last Year: Never true  Ran Out of Food in the Last Year: Never true  Transportation Needs: No Transportation Needs (04/22/2023)   PRAPARE - Administrator, Civil Service (Medical): No    Lack of Transportation (Non-Medical): No  Physical Activity: Sufficiently Active (04/22/2023)   Exercise Vital Sign    Days of Exercise per Week: 5 days    Minutes of Exercise per Session: 30 min  Stress: No Stress Concern Present (04/22/2023)   [employer] of Occupational Health - Occupational Stress Questionnaire    Feeling of Stress : Not at all  Social Connections: Moderately Isolated (04/22/2023)   Social Connection and Isolation Panel [NHANES]    Frequency of Communication with Friends and Family: More than three times a week    Frequency of Social Gatherings with Friends and Family: More than three times a week    Attends Religious Services: Never    Database administrator or Organizations: No    Attends Engineer, structural: Never    Marital Status: Married    Tobacco Counseling Counseling given: Not Answered   Clinical Intake:  Pre-visit preparation completed: Yes  Pain : No/denies pain Pain Score: 0-No pain     BMI - recorded: 42.64 Nutritional Status: BMI > 30  Obese Nutritional Risks: None Diabetes: Yes CBG done?: No Did pt. bring in CBG monitor from  home?: No  How often do you need to have someone help you when you read instructions, pamphlets, or other written materials from your doctor or pharmacy?: 1 - Never What is the last grade level you completed in school?: HSG  Interpreter Needed?: No  Information entered by :: Ethylene Reznick N. Andrue Dini, LPN.   Activities of Daily Living    04/22/2023    9:26 AM  In your present state of health, do you have any difficulty performing the following activities:  Hearing? 0  Vision? 0  Difficulty concentrating or making decisions? 0  Walking or climbing stairs? 0  Dressing or bathing? 0  Doing errands, shopping? 0  Preparing Food and eating ? N  Using the Toilet? N  In the past six months, have you accidently leaked urine? N  Do you have problems with loss of bowel control? N  Managing your Medications? N  Managing your Finances? N  Housekeeping or managing your Housekeeping? N    Patient Care Team: Hoy Register, MD as PCP - General (Family Medicine)  Indicate any recent Medical Services you may have received from other than [facility] providers in the past year (date may be approximate).     Assessment:   This is a routine wellness examination for Jasiel.  Hearing/Vision screen Hearing Screening - Comments:: Patient denied any hearing difficulty.   No hearing aids.  Vision Screening - Comments:: Patient does wear corrective lenses/contacts/readers.  Annual eye exam done by: [facility]    Goals Addressed             This Visit's Progress    Patient Stated       Stay healthy and out of the hospital.      Depression Screen    04/22/2023    9:26 AM 04/01/2022    8:39 AM 11/21/2021   10:04 AM 08/20/2021    9:27 AM 02/19/2021    9:41 AM 01/08/2021    9:10 AM 05/24/2020    8:44 AM  PHQ 2/9 Scores  PHQ - 2 Score 0 1 0 1 0 1 0  PHQ- 9 Score 0  3  2 3     Fall Risk    04/22/2023    9:26 AM 08/28/2022    8:44 AM 04/01/2022    8:39 AM 02/25/2022    9:26 AM 11/21/2021    10:03 AM  Fall Risk   Falls in the past year? 0 0 0 0 0  Number falls in past yr: 0 0 0 0   Injury with Fall? 0 0 0 0   Risk for fall due to : No Fall Risks  No Fall Risks No Fall Risks   Follow up Falls prevention discussed        MEDICARE RISK AT HOME: Medicare Risk at Home Any stairs in or around the home?: No If so, are there any without handrails?: No Home free of loose throw rugs in walkways, pet beds, electrical cords, etc?: Yes Adequate lighting in your home to reduce risk of falls?: Yes Life alert?: No Use of a cane, walker or w/c?: No Grab bars in the bathroom?: Yes Shower chair or bench in shower?: No Elevated toilet seat or a handicapped toilet?: Yes  TIMED UP AND GO:  Was the test performed?  No    Cognitive Function:    04/22/2023    9:27 AM 04/01/2022    8:40 AM  MMSE - Mini Mental State Exam  Not completed: Unable to complete   Orientation to time  5  Orientation to Place  5  Registration  3  Attention/ Calculation  5  Recall  3  Language- name 2 objects  2  Language- repeat  1  Language- follow 3 step command  3  Language- read & follow direction  1  Write a sentence  1  Copy design  1  Total score  30        04/22/2023    9:27 AM 04/01/2022    8:39 AM  6CIT Screen  What Year? 0 points 0 points  What month? 0 points 0 points  What time? 0 points 0 points  Count back from 20 0 points 0 points  Months in reverse 0 points 0 points  Repeat phrase 0 points 0 points  Total Score 0 points 0 points    Immunizations Immunization History  Administered Date(s) Administered   Influenza Whole 06/02/2009   Influenza,inj,Quad PF,6+ Mos 02/15/2013, 02/10/2015, 02/07/2017, 02/25/2022   PNEUMOCOCCAL CONJUGATE-20 04/01/2022   Pneumococcal Polysaccharide-23 12/13/2012   Tdap 06/26/2015   Zoster Recombinant(Shingrix) 01/08/2021, 03/12/2021    TDAP status: Up to date  Flu Vaccine status: Up to date  Pneumococcal vaccine status: Due, Education has  been provided regarding the importance of this vaccine. Advised may receive this vaccine at local pharmacy or Health Dept. Aware to provide a copy of the vaccination record if obtained from local pharmacy or Health Dept. Verbalized acceptance and understanding.  Covid-19 vaccine status: Completed vaccines  Qualifies for Shingles Vaccine? Yes   Zostavax completed No   Shingrix Completed?: Yes  Screening Tests Health Maintenance  Topic Date Due   OPHTHALMOLOGY EXAM  12/13/2022   COVID-19 Vaccine (4 - 2023-24 season) 01/26/2023   HEMOGLOBIN A1C  02/27/2023   FOOT EXAM  04/02/2023   Medicare Annual Wellness (AWV)  04/21/2024   DTaP/Tdap/Td (2 - Td or Tdap) 06/25/2025   Colonoscopy  11/06/2027   INFLUENZA VACCINE  Completed   Hepatitis C Screening  Completed   HIV Screening  Completed   Zoster Vaccines- Shingrix  Completed   HPV VACCINES  Aged Out  Health Maintenance  Health Maintenance Due  Topic Date Due   OPHTHALMOLOGY EXAM  12/13/2022   COVID-19 Vaccine (4 - 2023-24 season) 01/26/2023   HEMOGLOBIN A1C  02/27/2023   FOOT EXAM  04/02/2023    Colorectal cancer screening: Type of screening: Colonoscopy. Completed 11/05/2017. Repeat every 10 years  Lung Cancer Screening: (Low Dose CT Chest recommended if Age 35-80 years, 20 pack-year currently smoking OR have quit w/in 15years.) does not qualify.   Lung Cancer Screening Referral: no  Additional Screening:  Hepatitis C Screening: does qualify; Completed 01/22/2022  Vision Screening: Recommended annual ophthalmology exams for early detection of glaucoma and other disorders of the eye. Is the patient up to date with their annual eye exam?  Yes  01/13/2023 for glaucoma Who is the provider or what is the name of the office in which the patient attends annual eye exams? [facility] If pt is not established with a provider, would they like to be referred to a provider to establish care? No .   Dental Screening: Recommended  annual dental exams for proper oral hygiene  Diabetic Foot Exam: Diabetic Foot Exam: Completed 04/01/2022; patient is overdue.  Community Resource Referral / Chronic Care Management: CRR required this visit?  No   CCM required this visit?  No     Plan:     I have personally reviewed and noted the following in the patient's chart:   Medical and social history Use of alcohol, tobacco or illicit drugs  Current medications and supplements including opioid prescriptions. Patient is not currently taking opioid prescriptions. Functional ability and status Nutritional status Physical activity Advanced directives List of other physicians Hospitalizations, surgeries, and ER visits in previous 12 months Vitals Screenings to include cognitive, depression, and falls Referrals and appointments  In addition, I have reviewed and discussed with patient certain preventive protocols, quality metrics, and best practice recommendations. A written personalized care plan for preventive services as well as general preventive health recommendations were provided to patient.     Mickeal Needy, LPN   [id number]   After Visit Summary: (MyChart) Due to this being a telephonic visit, the after visit summary with patients personalized plan was offered to patient via MyChart   Nurse Notes: None

## 2023-04-23 DIAGNOSIS — N186 End stage renal disease: Secondary | ICD-10-CM | POA: Diagnosis not present

## 2023-04-23 DIAGNOSIS — Z992 Dependence on renal dialysis: Secondary | ICD-10-CM | POA: Diagnosis not present

## 2023-04-23 DIAGNOSIS — N2581 Secondary hyperparathyroidism of renal origin: Secondary | ICD-10-CM | POA: Diagnosis not present

## 2023-04-26 DIAGNOSIS — N186 End stage renal disease: Secondary | ICD-10-CM | POA: Diagnosis not present

## 2023-04-26 DIAGNOSIS — Z992 Dependence on renal dialysis: Secondary | ICD-10-CM | POA: Diagnosis not present

## 2023-04-26 DIAGNOSIS — N2581 Secondary hyperparathyroidism of renal origin: Secondary | ICD-10-CM | POA: Diagnosis not present

## 2023-04-26 DIAGNOSIS — E1122 Type 2 diabetes mellitus with diabetic chronic kidney disease: Secondary | ICD-10-CM | POA: Diagnosis not present

## 2023-04-29 DIAGNOSIS — Z992 Dependence on renal dialysis: Secondary | ICD-10-CM | POA: Diagnosis not present

## 2023-04-29 DIAGNOSIS — N186 End stage renal disease: Secondary | ICD-10-CM | POA: Diagnosis not present

## 2023-04-29 DIAGNOSIS — N2581 Secondary hyperparathyroidism of renal origin: Secondary | ICD-10-CM | POA: Diagnosis not present

## 2023-04-30 ENCOUNTER — Ambulatory Visit (HOSPITAL_COMMUNITY)
Admission: RE | Admit: 2023-04-30 | Discharge: 2023-04-30 | Disposition: A | Discharge status: Home / Self Care | Payer: Medicare HMO | Attending: Vascular Surgery | Admitting: Vascular Surgery

## 2023-04-30 ENCOUNTER — Encounter (HOSPITAL_COMMUNITY)
Admission: RE | Disposition: A | Discharge status: Home / Self Care | Payer: Self-pay | Source: Home / Self Care | Attending: Vascular Surgery

## 2023-04-30 ENCOUNTER — Other Ambulatory Visit: Payer: Self-pay

## 2023-04-30 ENCOUNTER — Encounter (HOSPITAL_COMMUNITY): Payer: Self-pay | Admitting: Vascular Surgery

## 2023-04-30 DIAGNOSIS — T82858A Stenosis of vascular prosthetic devices, implants and grafts, initial encounter: Secondary | ICD-10-CM | POA: Insufficient documentation

## 2023-04-30 DIAGNOSIS — Y832 Surgical operation with anastomosis, bypass or graft as the cause of abnormal reaction of the patient, or of later complication, without mention of misadventure at the time of the procedure: Secondary | ICD-10-CM | POA: Diagnosis not present

## 2023-04-30 DIAGNOSIS — N186 End stage renal disease: Secondary | ICD-10-CM | POA: Diagnosis not present

## 2023-04-30 DIAGNOSIS — I5042 Chronic combined systolic (congestive) and diastolic (congestive) heart failure: Secondary | ICD-10-CM | POA: Diagnosis not present

## 2023-04-30 DIAGNOSIS — I132 Hypertensive heart and chronic kidney disease with heart failure and with stage 5 chronic kidney disease, or end stage renal disease: Secondary | ICD-10-CM | POA: Insufficient documentation

## 2023-04-30 DIAGNOSIS — T82898A Other specified complication of vascular prosthetic devices, implants and grafts, initial encounter: Secondary | ICD-10-CM | POA: Diagnosis not present

## 2023-04-30 DIAGNOSIS — Z992 Dependence on renal dialysis: Secondary | ICD-10-CM | POA: Insufficient documentation

## 2023-04-30 DIAGNOSIS — N185 Chronic kidney disease, stage 5: Secondary | ICD-10-CM

## 2023-04-30 DIAGNOSIS — E1122 Type 2 diabetes mellitus with diabetic chronic kidney disease: Secondary | ICD-10-CM | POA: Diagnosis not present

## 2023-04-30 HISTORY — PX: PERIPHERAL VASCULAR BALLOON ANGIOPLASTY: CATH118281

## 2023-04-30 HISTORY — PX: A/V FISTULAGRAM: CATH118298

## 2023-04-30 LAB — POCT I-STAT, CHEM 8
BUN: 31 mg/dL — ABNORMAL HIGH (ref 8–23)
Calcium, Ion: 1.08 mmol/L — ABNORMAL LOW (ref 1.15–1.40)
Chloride: 100 mmol/L (ref 98–111)
Creatinine, Ser: 8.2 mg/dL — ABNORMAL HIGH (ref 0.61–1.24)
Glucose, Bld: 92 mg/dL (ref 70–99)
HCT: 42 % (ref 39.0–52.0)
Hemoglobin: 14.3 g/dL (ref 13.0–17.0)
Potassium: 4.8 mmol/L (ref 3.5–5.1)
Sodium: 141 mmol/L (ref 135–145)
TCO2: 33 mmol/L — ABNORMAL HIGH (ref 22–32)

## 2023-04-30 SURGERY — A/V FISTULAGRAM
Anesthesia: LOCAL | Laterality: Right

## 2023-04-30 MED ORDER — HEPARIN (PORCINE) IN NACL 1000-0.9 UT/500ML-% IV SOLN
INTRAVENOUS | Status: DC | PRN
  Administered 2023-04-30: 500 mL

## 2023-04-30 MED ORDER — LIDOCAINE HCL (PF) 1 % IJ SOLN
INTRAMUSCULAR | Status: DC | PRN
  Administered 2023-04-30: 2 mL

## 2023-04-30 MED ORDER — LIDOCAINE HCL (PF) 1 % IJ SOLN
INTRAMUSCULAR | Status: AC
  Filled 2023-04-30: qty 30

## 2023-04-30 MED ORDER — HEPARIN SODIUM (PORCINE) 1000 UNIT/ML IJ SOLN
INTRAMUSCULAR | Status: AC
  Filled 2023-04-30: qty 10

## 2023-04-30 MED ORDER — HEPARIN SODIUM (PORCINE) 1000 UNIT/ML IJ SOLN
INTRAMUSCULAR | Status: DC | PRN
  Administered 2023-04-30: 5000 [IU] via INTRAVENOUS

## 2023-04-30 MED ORDER — IODIXANOL 320 MG/ML IV SOLN
INTRAVENOUS | Status: DC | PRN
  Administered 2023-04-30: 75 mL

## 2023-04-30 SURGICAL SUPPLY — 17 items
BALLN IN.PACT DCB 7X60 (BALLOONS) ×1
BALLN LUTONIX AV 12X40X75 (BALLOONS) ×1
BALLN MUSTANG 6.0X20 75 (BALLOONS) ×1
BALLN MUSTANG 8X60X75 (BALLOONS) ×1
BALLOON LUTONIX AV 12X40X75 (BALLOONS) IMPLANT
BALLOON MUSTANG 6.0X20 75 (BALLOONS) IMPLANT
BALLOON MUSTANG 8X60X75 (BALLOONS) IMPLANT
COVER DOME SNAP 22 D (MISCELLANEOUS) ×1 IMPLANT
DCB IN.PACT 7X60 (BALLOONS) IMPLANT
GLIDEWIRE ADV .035X260CM (WIRE) IMPLANT
KIT ENCORE 26 ADVANTAGE (KITS) IMPLANT
KIT MICROPUNCTURE NIT STIFF (SHEATH) IMPLANT
SHEATH PINNACLE R/O II 7F 4CM (SHEATH) IMPLANT
SHEATH PROBE COVER 6X72 (BAG) ×1 IMPLANT
STOPCOCK MORSE 400PSI 3WAY (MISCELLANEOUS) ×1 IMPLANT
TRAY PV CATH (CUSTOM PROCEDURE TRAY) ×1 IMPLANT
TUBING CIL FLEX 10 FLL-RA (TUBING) ×1 IMPLANT

## 2023-04-30 NOTE — Op Note (Signed)
    Patient name: Adam Dennis MRN: [id number] DOB: [birth year] Sex: male  04/30/2023 Pre-operative Diagnosis: Right upper extremity fistula malfunction Post-operative diagnosis:  Same Surgeon:  Victorino Sparrow, MD Procedure Performed: 1.  Right upper extremity fistulogram 2.  Balloon venoplasty the 6 x 40, 8 x 60, 10 x 40 mm 3.  Drug-coated balloon venoplasty 7 x 60, 12 x 40 4.  Access managed using Monocryl suture   Indications: Patient is a 62 year old male with end-stage renal disease who presented to the outpatient clinic with elevated pressures in his right arm fistula.  He continues to use the fistula, but has had some prolonged bleeding.  After discussing risk and benefits of right arm fistulogram in an effort to assess for central stenosis, [name] elected to proceed.  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   Procedure:  The patient was identified in the holding area and taken to room 8.  He was prepped and draped in standard fashion and a timeout was performed.  The case began with micropuncture access of the right sided brachiocephalic fistula.  A micro sheath was placed, and fistulogram followed.  There are multiple areas of greater than 70% stenosis appreciated.  See results above.  I elected to attempt intervention on all of these areas. The patient was heparinized with 5000 units IV heparin in the micropuncture sheath was exchanged for 7 French short sheath.  I initially began with an 8 mm balloon, however this appeared large, so used a 6 mm balloon both proximal and distal to the previously placed stent.  This is inflated over profile for 2 minutes each.  Next, I moved to an 8 mm balloon and  inflated this at the confluence of the cephalic vein and axillary vein.  This was inflated under profile.  Next, this balloon was moved to the right brachiocephalic vein and inflated.  There was significant narrowing of this vein.  The 8 mm balloon was inflated for 2 minutes without issue.  I then moved to a 10 mm balloon in the brachiocephalic vein, and finally a 12 mm balloon that was drug-coated.  This was inflated for 3 minutes.  I then moved peripherally and used a 7 mm x 60 mm drug-coated balloon for the cephalic vein at the confluence with the axillary vein.  Follow-up angiography demonstrated excellent result with resolution of flow-limiting stenosis throughout the fistula.  Furthermore, there was no retrograde venous filling of the axillary vein, which was appreciated on initial study.   Impression: Successful drug-coated balloon venoplasty of the right brachiocephalic fistula with resolution of flow-limiting stenoses.  The fistula can continue to be accessed.   Victorino Sparrow MD [facility] Office: [phone]

## 2023-04-30 NOTE — H&P (Signed)
[facility] [place] HISTORY AND PHYSICAL    Patient seen and examined in preop holding.  No complaints. No changes to medication history or physical exam since last seen in clinic. After discussing the risks and benefits of right fistulagram, NUSSEN WINGERTER elected to proceed.   Victorino Sparrow MD    History of Present Illness:  Patient is a 62 y.o. year old male who presents for examination of his right AV fistula.  He reports that he is not really sure why it is malfunctioning.  He states he has had the fistula for approximately 4 years.  [facility] has performed fistulagrams in the past.    He denies pain. Loss of motor or loss of sensation in the right UE.      Past Medical History:  Diagnosis Date   Arthritis    "back" (02/06/2017)   CHF (congestive heart failure) (HCC)    new onset /Encounter Date 09/12/2006   Chronic combined systolic and diastolic heart failure (HCC)    CKD (chronic kidney disease) stage 4, GFR 15-29 ml/min (HCC)    COPD (chronic obstructive pulmonary disease) (HCC)    End stage renal disease on dialysis [facility])    Tues/ Thur/ Sat  Road   Glaucoma, right eye    High cholesterol    Hypertension    Hypertrophy of tonsils alone    Myocardial infarction (HCC)    "small one; ~ 2008" (02/06/2017)   Obesity, unspecified    On home oxygen therapy    "4L; 24/7" (04/10/2017)   OSA on CPAP    Type II diabetes mellitus (HCC)     Past Surgical History:  Procedure Laterality Date   AV FISTULA PLACEMENT Right 07/01/2019   Procedure: Right arm arteriovenous fistual;  Surgeon: Maeola Harman, MD;  Location: [facility] OR;  Service: Vascular;  Laterality: Right;   FISTULA SUPERFICIALIZATION Right 09/01/2019   Procedure: RIGHT UPPER ARM FISTULA  TRANSPOSITION OF RIGHT UPPPER ARM BRACHIAL CEPHALIC FISTULA;  Surgeon: Maeola Harman, MD;  Location: [facility] OR;  Service: Vascular;  Laterality: Right;   HERNIA REPAIR     INSERTION OF  DIALYSIS CATHETER Right 07/01/2019   Procedure: INSERTION OF TUNNELED DIALYSIS CATHETER;  Surgeon: Maeola Harman, MD;  Location: [facility] OR;  Service: Vascular;  Laterality: Right;   TEE WITHOUT CARDIOVERSION N/A 04/16/2017   Procedure: TRANSESOPHAGEAL ECHOCARDIOGRAM (TEE);  Surgeon: Chrystie Nose, MD;  Location: [facility] ENDOSCOPY;  Service: Cardiovascular;  Laterality: N/A;   UMBILICAL HERNIA REPAIR  1960s   "when I was little baby"     Social History Social History   Tobacco Use   Smoking status: Never   Smokeless tobacco: Never  Vaping Use   Vaping status: Never Used  Substance Use Topics   Alcohol use: Yes    Comment: occasional   Drug use: No    Family History Family History  Problem Relation Age of Onset   Hypertension Mother    Diabetes Sister    Colon cancer Sister    Diabetes Brother    Cancer Maternal Aunt    Amblyopia Neg Hx    Blindness Neg Hx    Cataracts Neg Hx    Glaucoma Neg Hx    Macular degeneration Neg Hx    Strabismus Neg Hx    Retinal detachment Neg Hx    Retinitis pigmentosa Neg Hx     Allergies  No Known Allergies   No current facility-administered medications for this encounter.  ROS:   General:  No weight loss, Fever, chills  HEENT: No recent headaches, no nasal bleeding, no visual changes, no sore throat  Neurologic: No dizziness, blackouts, seizures. No recent symptoms of stroke or mini- stroke. No recent episodes of slurred speech, or temporary blindness.  Cardiac: No recent episodes of chest pain/pressure, no shortness of breath at rest.  No shortness of breath with exertion.  Denies history of atrial fibrillation or irregular heartbeat  Vascular: No history of rest pain in feet.  No history of claudication.  No history of non-healing ulcer, No history of DVT   Pulmonary: No home oxygen, no productive cough, no hemoptysis,  No asthma or wheezing  Musculoskeletal:  [ ]  Arthritis, [ ]  Low back pain,  [ ]  Joint  pain  Hematologic:No history of hypercoagulable state.  No history of easy bleeding.  No history of anemia  Gastrointestinal: No hematochezia or melena,  No gastroesophageal reflux, no trouble swallowing  Urinary: [ ]  chronic Kidney disease, [ ]  on HD - [ ]  MWF or [ ]  TTHS, [ ]  Burning with urination, [ ]  Frequent urination, [ ]  Difficulty urinating;   Skin: No rashes  Psychological: No history of anxiety,  No history of depression   Physical Examination  Vitals:   04/30/23 0810 04/30/23 0815  BP:  (!) 154/87  Pulse:  96  Temp: 98 F (36.7 C)   TempSrc: Oral   SpO2:  94%  Weight: (!) 142.6 kg   Height: 6' (1.829 m)     Body mass index is 42.64 kg/m.  General:  Alert and oriented, no acute distress HEENT: Normal Neck: No bruit or JVD Pulmonary: Clear to auscultation bilaterally Cardiac: Regular Rate and Rhythm without murmur Gastrointestinal: Soft, non-tender, non-distended, no mass, no scars Skin: No rash Extremity Pulses:  2+ radial, brachial pulses bilaterally, the fistula is pulsatile to palpation Musculoskeletal: No deformity or edema  Neurologic: Upper and lower extremity motor 5/5 and symmetric  DATA:  +--------------------+----------+-----------------+--------+  AVF                PSV (cm/s)Flow Vol (mL/min)Comments  +--------------------+----------+-----------------+--------+  Native artery inflow   123          2045                 +--------------------+----------+-----------------+--------+  AVF Anastomosis        261                               +--------------------+----------+-----------------+--------+     +---------------+----------+-------------+----------+--------+  OUTFLOW VEIN   PSV (cm/s)Diameter (cm)Depth (cm)Describe  +---------------+----------+-------------+----------+--------+  Subclavian vein   348        1.50        1.24             +---------------+----------+-------------+----------+--------+  Shoulder          150        0.73        0.61             +---------------+----------+-------------+----------+--------+  Prox UA           246        0.78        0.77             +---------------+----------+-------------+----------+--------+  Mid UA            117        2.90  0.69             +---------------+----------+-------------+----------+--------+  Dist UA           221        2.27        2.60             +---------------+----------+-------------+----------+--------+        Summary:  Patent enlarged aneurysmal, tortuous arterio-venous fistula.   ASSESSMENT/PLAN:  ESRD with malfunctioning right UE AV fistula The fistula has aneurysmal areas without ulcers or prolonged bleeding.   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  I will schedule him for fistulasgram and possible intervention.  He has HD TTS.  He is not currently on anticoagulation.  He prefers a Wednesday for the procedure if possible.  He agrees to proceed.         Victorino Sparrow PA-C [facility] [place] Office: [phone] Pager: [phone]

## 2023-05-01 DIAGNOSIS — N186 End stage renal disease: Secondary | ICD-10-CM | POA: Diagnosis not present

## 2023-05-01 DIAGNOSIS — Z992 Dependence on renal dialysis: Secondary | ICD-10-CM | POA: Diagnosis not present

## 2023-05-01 DIAGNOSIS — N2581 Secondary hyperparathyroidism of renal origin: Secondary | ICD-10-CM | POA: Diagnosis not present

## 2023-05-03 DIAGNOSIS — N186 End stage renal disease: Secondary | ICD-10-CM | POA: Diagnosis not present

## 2023-05-03 DIAGNOSIS — N2581 Secondary hyperparathyroidism of renal origin: Secondary | ICD-10-CM | POA: Diagnosis not present

## 2023-05-03 DIAGNOSIS — Z992 Dependence on renal dialysis: Secondary | ICD-10-CM | POA: Diagnosis not present

## 2023-05-06 DIAGNOSIS — N186 End stage renal disease: Secondary | ICD-10-CM | POA: Diagnosis not present

## 2023-05-06 DIAGNOSIS — Z992 Dependence on renal dialysis: Secondary | ICD-10-CM | POA: Diagnosis not present

## 2023-05-06 DIAGNOSIS — N2581 Secondary hyperparathyroidism of renal origin: Secondary | ICD-10-CM | POA: Diagnosis not present

## 2023-05-07 ENCOUNTER — Other Ambulatory Visit: Payer: Self-pay | Admitting: Family Medicine

## 2023-05-07 DIAGNOSIS — I1311 Hypertensive heart and chronic kidney disease without heart failure, with stage 5 chronic kidney disease, or end stage renal disease: Secondary | ICD-10-CM

## 2023-05-08 DIAGNOSIS — Z992 Dependence on renal dialysis: Secondary | ICD-10-CM | POA: Diagnosis not present

## 2023-05-08 DIAGNOSIS — N186 End stage renal disease: Secondary | ICD-10-CM | POA: Diagnosis not present

## 2023-05-08 DIAGNOSIS — N2581 Secondary hyperparathyroidism of renal origin: Secondary | ICD-10-CM | POA: Diagnosis not present

## 2023-05-10 DIAGNOSIS — N186 End stage renal disease: Secondary | ICD-10-CM | POA: Diagnosis not present

## 2023-05-10 DIAGNOSIS — Z992 Dependence on renal dialysis: Secondary | ICD-10-CM | POA: Diagnosis not present

## 2023-05-10 DIAGNOSIS — N2581 Secondary hyperparathyroidism of renal origin: Secondary | ICD-10-CM | POA: Diagnosis not present

## 2023-05-13 DIAGNOSIS — Z992 Dependence on renal dialysis: Secondary | ICD-10-CM | POA: Diagnosis not present

## 2023-05-13 DIAGNOSIS — N2581 Secondary hyperparathyroidism of renal origin: Secondary | ICD-10-CM | POA: Diagnosis not present

## 2023-05-13 DIAGNOSIS — N186 End stage renal disease: Secondary | ICD-10-CM | POA: Diagnosis not present

## 2023-05-15 DIAGNOSIS — N2581 Secondary hyperparathyroidism of renal origin: Secondary | ICD-10-CM | POA: Diagnosis not present

## 2023-05-15 DIAGNOSIS — Z992 Dependence on renal dialysis: Secondary | ICD-10-CM | POA: Diagnosis not present

## 2023-05-15 DIAGNOSIS — N186 End stage renal disease: Secondary | ICD-10-CM | POA: Diagnosis not present

## 2023-05-17 DIAGNOSIS — N2581 Secondary hyperparathyroidism of renal origin: Secondary | ICD-10-CM | POA: Diagnosis not present

## 2023-05-17 DIAGNOSIS — Z992 Dependence on renal dialysis: Secondary | ICD-10-CM | POA: Diagnosis not present

## 2023-05-17 DIAGNOSIS — N186 End stage renal disease: Secondary | ICD-10-CM | POA: Diagnosis not present

## 2023-05-19 DIAGNOSIS — Z992 Dependence on renal dialysis: Secondary | ICD-10-CM | POA: Diagnosis not present

## 2023-05-19 DIAGNOSIS — N2581 Secondary hyperparathyroidism of renal origin: Secondary | ICD-10-CM | POA: Diagnosis not present

## 2023-05-19 DIAGNOSIS — N186 End stage renal disease: Secondary | ICD-10-CM | POA: Diagnosis not present

## 2023-05-22 DIAGNOSIS — N2581 Secondary hyperparathyroidism of renal origin: Secondary | ICD-10-CM | POA: Diagnosis not present

## 2023-05-22 DIAGNOSIS — N186 End stage renal disease: Secondary | ICD-10-CM | POA: Diagnosis not present

## 2023-05-22 DIAGNOSIS — Z992 Dependence on renal dialysis: Secondary | ICD-10-CM | POA: Diagnosis not present

## 2023-05-24 DIAGNOSIS — Z992 Dependence on renal dialysis: Secondary | ICD-10-CM | POA: Diagnosis not present

## 2023-05-24 DIAGNOSIS — N186 End stage renal disease: Secondary | ICD-10-CM | POA: Diagnosis not present

## 2023-05-24 DIAGNOSIS — N2581 Secondary hyperparathyroidism of renal origin: Secondary | ICD-10-CM | POA: Diagnosis not present

## 2023-05-26 DIAGNOSIS — Z992 Dependence on renal dialysis: Secondary | ICD-10-CM | POA: Diagnosis not present

## 2023-05-26 DIAGNOSIS — N186 End stage renal disease: Secondary | ICD-10-CM | POA: Diagnosis not present

## 2023-05-26 DIAGNOSIS — N2581 Secondary hyperparathyroidism of renal origin: Secondary | ICD-10-CM | POA: Diagnosis not present

## 2023-05-27 DIAGNOSIS — Z992 Dependence on renal dialysis: Secondary | ICD-10-CM | POA: Diagnosis not present

## 2023-05-27 DIAGNOSIS — N186 End stage renal disease: Secondary | ICD-10-CM | POA: Diagnosis not present

## 2023-05-27 DIAGNOSIS — E1122 Type 2 diabetes mellitus with diabetic chronic kidney disease: Secondary | ICD-10-CM | POA: Diagnosis not present

## 2023-05-29 DIAGNOSIS — N2581 Secondary hyperparathyroidism of renal origin: Secondary | ICD-10-CM | POA: Diagnosis not present

## 2023-05-29 DIAGNOSIS — Z992 Dependence on renal dialysis: Secondary | ICD-10-CM | POA: Diagnosis not present

## 2023-05-29 DIAGNOSIS — N186 End stage renal disease: Secondary | ICD-10-CM | POA: Diagnosis not present

## 2023-05-31 DIAGNOSIS — N2581 Secondary hyperparathyroidism of renal origin: Secondary | ICD-10-CM | POA: Diagnosis not present

## 2023-05-31 DIAGNOSIS — Z992 Dependence on renal dialysis: Secondary | ICD-10-CM | POA: Diagnosis not present

## 2023-05-31 DIAGNOSIS — N186 End stage renal disease: Secondary | ICD-10-CM | POA: Diagnosis not present

## 2023-06-03 ENCOUNTER — Other Ambulatory Visit: Payer: Self-pay | Admitting: Family Medicine

## 2023-06-03 DIAGNOSIS — N186 End stage renal disease: Secondary | ICD-10-CM | POA: Diagnosis not present

## 2023-06-03 DIAGNOSIS — Z992 Dependence on renal dialysis: Secondary | ICD-10-CM | POA: Diagnosis not present

## 2023-06-03 DIAGNOSIS — I1311 Hypertensive heart and chronic kidney disease without heart failure, with stage 5 chronic kidney disease, or end stage renal disease: Secondary | ICD-10-CM

## 2023-06-03 DIAGNOSIS — N2581 Secondary hyperparathyroidism of renal origin: Secondary | ICD-10-CM | POA: Diagnosis not present

## 2023-06-04 ENCOUNTER — Ambulatory Visit: Payer: Medicare HMO | Attending: Family Medicine | Admitting: Family Medicine

## 2023-06-04 ENCOUNTER — Encounter: Payer: Self-pay | Admitting: Family Medicine

## 2023-06-04 VITALS — BP 129/80 | HR 91 | Ht 72.0 in | Wt 315.2 lb

## 2023-06-04 DIAGNOSIS — I1311 Hypertensive heart and chronic kidney disease without heart failure, with stage 5 chronic kidney disease, or end stage renal disease: Secondary | ICD-10-CM | POA: Diagnosis not present

## 2023-06-04 DIAGNOSIS — E1122 Type 2 diabetes mellitus with diabetic chronic kidney disease: Secondary | ICD-10-CM

## 2023-06-04 DIAGNOSIS — E1169 Type 2 diabetes mellitus with other specified complication: Secondary | ICD-10-CM

## 2023-06-04 DIAGNOSIS — E669 Obesity, unspecified: Secondary | ICD-10-CM

## 2023-06-04 DIAGNOSIS — Z992 Dependence on renal dialysis: Secondary | ICD-10-CM

## 2023-06-04 DIAGNOSIS — J9611 Chronic respiratory failure with hypoxia: Secondary | ICD-10-CM | POA: Diagnosis not present

## 2023-06-04 DIAGNOSIS — N186 End stage renal disease: Secondary | ICD-10-CM | POA: Diagnosis not present

## 2023-06-04 DIAGNOSIS — Z7985 Long-term (current) use of injectable non-insulin antidiabetic drugs: Secondary | ICD-10-CM | POA: Diagnosis not present

## 2023-06-04 LAB — POCT GLYCOSYLATED HEMOGLOBIN (HGB A1C): HbA1c, POC (controlled diabetic range): 5.9 % (ref 0.0–7.0)

## 2023-06-04 MED ORDER — OZEMPIC (1 MG/DOSE) 4 MG/3ML ~~LOC~~ SOPN: 1.0000 mg | PEN_INJECTOR | SUBCUTANEOUS | 6 refills | Status: DC

## 2023-06-04 MED ORDER — ATORVASTATIN CALCIUM 40 MG PO TABS: ORAL_TABLET | ORAL | 1 refills | Status: DC

## 2023-06-04 MED ORDER — AMLODIPINE BESYLATE 5 MG PO TABS: 5.0000 mg | ORAL_TABLET | Freq: Every day | ORAL | 1 refills | Status: DC

## 2023-06-04 NOTE — Progress Notes (Signed)
 Subjective:  Patient ID: Adam Dennis, male    DOB: 1960/06/13  Age: 63 y.o. MRN: [id number]  CC: Medical Management of Chronic Issues   HPI Adam Dennis is a 63 y.o. year old male with a history of type 2 diabetes (A1c 6.0), hypertension, ESRD on HD, obstructive sleep apnea (on BiPAP at night), combined systolic and diastolic CHF (EF 49-44% from echo of 10/2021),emphysema, chronic respiratory failure ( on 4L oxygen )  who presents today for chronic condition management.   Interval History: Discussed the use of AI scribe software for clinical note transcription with the patient, who gave verbal consent to proceed.  He presents for a routine follow-up. He reports no new concerns and is generally doing well. He is on 4 liters of oxygen  and uses a ventilator-like device at night, for sleep apnea. He denies any recent exacerbations of his COPD, such as wheezing or shortness of breath. He also denies any recent swelling of his legs, a common symptom of heart failure.  He had a recent hospital visit for a procedure on his right arm related to his dialysis fistula. He has seen an eye doctor within the past year, which is important for his diabetes management. His last A1c was 6.0, indicating good blood sugar control. He is currently taking Ozempic  for his diabetes and a statin medication. He also has a prescription for Robaxin , a muscle relaxant, but does not currently need a refill.        Past Medical History:  Diagnosis Date   Arthritis    back (02/06/2017)   CHF (congestive heart failure) (HCC)    new onset /Encounter Date 09/12/2006   Chronic combined systolic and diastolic heart failure (HCC)    CKD (chronic kidney disease) stage 4, GFR 15-29 ml/min (HCC)    COPD (chronic obstructive pulmonary disease) (HCC)    End stage renal disease on dialysis [facility])    Tues/ Thur/ Sat [address]   Glaucoma, right eye    High cholesterol    Hypertension    Hypertrophy of tonsils alone     Myocardial infarction (HCC)    small one; ~ 2008 (02/06/2017)   Obesity, unspecified    On home oxygen  therapy    4L; 24/7 (04/10/2017)   OSA on CPAP    Type II diabetes mellitus (HCC)     Past Surgical History:  Procedure Laterality Date   A/V FISTULAGRAM Right 04/30/2023   Procedure: A/V Fistulagram;  Surgeon: Lanis Fonda FORBES, MD;  Location: [facility] INVASIVE CV LAB;  Service: Cardiovascular;  Laterality: Right;   AV FISTULA PLACEMENT Right 07/01/2019   Procedure: Right arm arteriovenous fistual;  Surgeon: Sheree Penne Bruckner, MD;  Location: [facility] OR;  Service: Vascular;  Laterality: Right;   FISTULA SUPERFICIALIZATION Right 09/01/2019   Procedure: RIGHT UPPER ARM FISTULA  TRANSPOSITION OF RIGHT UPPPER ARM BRACHIAL CEPHALIC FISTULA;  Surgeon: Sheree Penne Bruckner, MD;  Location: [facility] OR;  Service: Vascular;  Laterality: Right;   HERNIA REPAIR     INSERTION OF DIALYSIS CATHETER Right 07/01/2019   Procedure: INSERTION OF TUNNELED DIALYSIS CATHETER;  Surgeon: Sheree Penne Bruckner, MD;  Location: [facility] OR;  Service: Vascular;  Laterality: Right;   PERIPHERAL VASCULAR BALLOON ANGIOPLASTY Right 04/30/2023   Procedure: PERIPHERAL VASCULAR BALLOON ANGIOPLASTY;  Surgeon: Lanis Fonda FORBES, MD;  Location: [facility] INVASIVE CV LAB;  Service: Cardiovascular;  Laterality: Right;  AVF   TEE WITHOUT CARDIOVERSION N/A 04/16/2017   Procedure: TRANSESOPHAGEAL ECHOCARDIOGRAM (TEE);  Surgeon: Mona Vinie BROCKS,  MD;  Location: [facility] ENDOSCOPY;  Service: Cardiovascular;  Laterality: N/A;   UMBILICAL HERNIA REPAIR  1960s   when I was little baby    Family History  Problem Relation Age of Onset   Hypertension Mother    Diabetes Sister    Colon cancer Sister    Diabetes Brother    Cancer Maternal Aunt    Amblyopia Neg Hx    Blindness Neg Hx    Cataracts Neg Hx    Glaucoma Neg Hx    Macular degeneration Neg Hx    Strabismus Neg Hx    Retinal detachment Neg Hx    Retinitis pigmentosa Neg Hx     Social  History   Socioeconomic History   Marital status: Married    Spouse name: Not on file   Number of children: 0   Years of education: Not on file   Highest education level: Not on file  Occupational History   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  Tobacco Use   Smoking status: Never   Smokeless tobacco: Never  Vaping Use   Vaping status: Never Used  Substance and Sexual Activity   Alcohol use: Yes    Comment: occasional   Drug use: No   Sexual activity: Not Currently  Other Topics Concern   Not on file  Social History Narrative   Not on file   Social Drivers of Health   Financial Resource Strain: Low Risk  (06/04/2023)   Overall Financial Resource Strain (CARDIA)    Difficulty of Paying Living Expenses: Not very hard  Food Insecurity: No Food Insecurity (06/04/2023)   Hunger Vital Sign    Worried About Running Out of Food in the Last Year: Never true    Ran Out of Food in the Last Year: Never true  Transportation Needs: No Transportation Needs (06/04/2023)   PRAPARE - Administrator, Civil Service (Medical): No    Lack of Transportation (Non-Medical): No  Physical Activity: Insufficiently Active (06/04/2023)   Exercise Vital Sign    Days of Exercise per Week: 3 days    Minutes of Exercise per Session: 20 min  Stress: No Stress Concern Present (06/04/2023)   [employer] of Occupational Health - Occupational Stress Questionnaire    Feeling of Stress : Not at all  Social Connections: Socially Isolated (06/04/2023)   Social Connection and Isolation Panel [NHANES]    Frequency of Communication with Friends and Family: Once a week    Frequency of Social Gatherings with Friends and Family: Once a week    Attends Religious Services: Never    Database Administrator or Organizations: No    Attends Engineer, Structural: Never    Marital Status: Living with partner    No Known Allergies  Outpatient Medications Prior to Visit  Medication Sig Dispense Refill   ACCU-CHEK  SOFTCLIX LANCETS lancets Use as instructed to check blood sugar up to 3 times daily. 100 each 11   aspirin  EC 81 MG tablet Take 81 mg by mouth daily.     Blood Glucose Monitoring Suppl (ACCU-CHEK AVIVA) device Use as instructed to check blood sugar up to 3 times daily. 1 each 0   dorzolamide -timolol  (COSOPT ) 22.3-6.8 MG/ML ophthalmic solution Place 1 drop into both eyes 2 (two) times daily.     FEROSUL 325 (65 Fe) MG tablet TAKE 1 TABLET(325 MG) BY MOUTH DAILY WITH BREAKFAST (Patient taking differently: Take 325 mg by mouth Every Tuesday,Thursday,and Saturday with dialysis.) 100 tablet  3   glucose blood (ACCU-CHEK AVIVA PLUS) test strip Use as instructed to check blood sugar up to 3 times daily. 100 each 11   Lancet Devices (ACCU-CHEK SOFTCLIX) lancets Use as instructed daily. 1 each 5   methocarbamol  (ROBAXIN ) 500 MG tablet Take 1 tablet (500 mg total) by mouth every 8 (eight) hours as needed for muscle spasms. 180 tablet 1   nitroGLYCERIN  (NITROSTAT ) 0.4 MG SL tablet PLACE 1 TABLET BY MOUTH UNDER TONGUE EVERY 5 MINUTES FOR 3 DOSES AS NEEDED FOR CHEST PAIN (Patient taking differently: Place 0.4 mg under the tongue every 5 (five) minutes as needed for chest pain.) 25 tablet 0   sevelamer  carbonate (RENVELA ) 800 MG tablet Take 1,600-2,400 mg by mouth See admin instructions. 2400 mg with meals, and 1600 mg with snacks     TRAVATAN Z 0.004 % SOLN ophthalmic solution Place 1 drop into both eyes at bedtime.  12   amLODipine  (NORVASC ) 5 MG tablet TAKE 1 TABLET(5 MG) BY MOUTH DAILY 30 tablet 0   atorvastatin  (LIPITOR) 40 MG tablet TAKE 1 TABLET(40 MG) BY MOUTH DAILY 90 tablet 1   Semaglutide , 1 MG/DOSE, (OZEMPIC , 1 MG/DOSE,) 4 MG/3ML SOPN INJECT 1 MG AS DIRECTED ONCE A WEEK 3 mL 2   Facility-Administered Medications Prior to Visit  Medication Dose Route Frequency Provider Last Rate Last Admin   sodium chloride  flush (NS) 0.9 % injection 3 mL  3 mL Intravenous Q12H Robins, Joshua E, MD       sodium  chloride flush (NS) 0.9 % injection 3 mL  3 mL Intravenous PRN Robins, Joshua E, MD         ROS Review of Systems  Constitutional:  Negative for activity change and appetite change.  HENT:  Negative for sinus pressure and sore throat.   Respiratory:  Negative for chest tightness, shortness of breath and wheezing.   Cardiovascular:  Negative for chest pain and palpitations.  Gastrointestinal:  Negative for abdominal distention, abdominal pain and constipation.  Genitourinary: Negative.   Musculoskeletal: Negative.   Psychiatric/Behavioral:  Negative for behavioral problems and dysphoric mood.     Objective:  BP 129/80   Pulse 91   Ht 6' (1.829 m)   Wt (!) 315 lb 3.2 oz (143 kg)   SpO2 99%   BMI 42.75 kg/m      06/04/2023    1:56 PM 04/30/2023   10:14 AM 04/30/2023   10:02 AM  BP/Weight  Systolic BP 129 152 166  Diastolic BP 80 104 96  Wt. (Lbs) 315.2    BMI 42.75 kg/m2        Physical Exam Constitutional:      Appearance: He is well-developed.  HENT:     Nose:     Comments: On 4 L of oxygen  via nasal cannula Cardiovascular:     Rate and Rhythm: Normal rate.     Heart sounds: Normal heart sounds. No murmur heard. Pulmonary:     Effort: Pulmonary effort is normal.     Breath sounds: Normal breath sounds. No wheezing or rales.  Chest:     Chest wall: No tenderness.  Abdominal:     General: Bowel sounds are normal. There is no distension.     Palpations: Abdomen is soft. There is no mass.     Tenderness: There is no abdominal tenderness.  Musculoskeletal:        General: Normal range of motion.     Right lower leg: No edema.  Left lower leg: No edema.  Neurological:     Mental Status: He is alert and oriented to person, place, and time.  Psychiatric:        Mood and Affect: Mood normal.        Latest Ref Rng & Units 04/30/2023    8:24 AM 01/26/2022    8:02 AM 01/25/2022    6:27 AM  CMP  Glucose 70 - 99 mg/dL 92  889  96   BUN 8 - 23 mg/dL 31  48  28    Creatinine 0.61 - 1.24 mg/dL 1.79  0.00  2.45   Sodium 135 - 145 mmol/L 141  137  135   Potassium 3.5 - 5.1 mmol/L 4.8  4.5  4.5   Chloride 98 - 111 mmol/L 100  98  94   CO2 22 - 32 mmol/L  29  31   Calcium  8.9 - 10.3 mg/dL  9.4  9.5     Lipid Panel     Component Value Date/Time   CHOL 136 11/11/2021 0057   CHOL 146 08/20/2021 1056   TRIG 104 11/11/2021 0057   HDL 66 11/11/2021 0057   HDL 54 08/20/2021 1056   CHOLHDL 2.1 11/11/2021 0057   VLDL 21 11/11/2021 0057   LDLCALC 49 11/11/2021 0057   LDLCALC 72 08/20/2021 1056    CBC    Component Value Date/Time   WBC 5.5 01/26/2022 0802   RBC 4.28 01/26/2022 0802   HGB 14.3 04/30/2023 0824   HGB 12.8 (L) 03/24/2019 1017   HCT 42.0 04/30/2023 0824   HCT 39.7 03/24/2019 1017   PLT 257 01/26/2022 0802   PLT 191 03/24/2019 1017   MCV 88.8 01/26/2022 0802   MCV 87 03/24/2019 1017   MCH 26.9 01/26/2022 0802   MCHC 30.3 01/26/2022 0802   RDW 17.5 (H) 01/26/2022 0802   RDW 13.9 03/24/2019 1017   LYMPHSABS 1.0 01/21/2022 0845   LYMPHSABS 1.3 03/24/2019 1017   MONOABS 0.6 01/21/2022 0845   EOSABS 0.1 01/21/2022 0845   EOSABS 0.0 03/24/2019 1017   BASOSABS 0.0 01/21/2022 0845   BASOSABS 0.0 03/24/2019 1017    Lab Results  Component Value Date   HGBA1C 5.9 06/04/2023    Assessment & Plan:      Type 2 Diabetes Mellitus Well controlled with an A1c of 5.9. Patient is on Ozempic . -Continue current regimen. -Refill Ozempic  prescription. -Counseled on Diabetic diet, my plate method, 849 minutes of moderate intensity exercise/week Blood sugar logs with fasting goals of 80-120 mg/dl, random of less than 819 and in the event of sugars less than 60 mg/dl or greater than 599 mg/dl encouraged to notify the clinic. Advised on the need for annual eye exams, annual foot exams, Pneumonia vaccine.   Chronic Obstructive Pulmonary Disease (COPD)/chronic respiratory failure No current symptoms of wheezing or shortness of breath. Patient  is on 4 liters of oxygen  and uses a ventilator at night. -Continue current regimen.  Heart Failure No current symptoms of leg swelling. Last cardiology visit unknown. -Continue current regimen.  Hyperlipidemia Patient is on statin -Continue current regimen.  ESRD Continue hemodialysis per schedule  Hypertension Blood pressure is controlled -Continue amlodipine   General Health Maintenance: -Annual foot exam performed with good circulation and sensation noted. -Annual eye exam Patient reported having an eye doctor and having had an exam last year. -Refill Robaxin  prescription as needed. -Assist with SCAT bus evaluation renewal.          Meds ordered  this encounter  Medications   amLODipine  (NORVASC ) 5 MG tablet    Sig: Take 1 tablet (5 mg total) by mouth daily.    Dispense:  90 tablet    Refill:  1    Must have office visit for refills   atorvastatin  (LIPITOR) 40 MG tablet    Sig: TAKE 1 TABLET(40 MG) BY MOUTH DAILY    Dispense:  90 tablet    Refill:  1   Semaglutide , 1 MG/DOSE, (OZEMPIC , 1 MG/DOSE,) 4 MG/3ML SOPN    Sig: Inject 1 mg into the skin once a week.    Dispense:  3 mL    Refill:  6    Follow-up: Return in about 6 months (around 12/02/2023) for Chronic medical conditions.       Corrina Sabin, MD, FAAFP. [facility] [place], [place] [phone]   06/04/2023, 2:12 PM

## 2023-06-04 NOTE — Patient Instructions (Signed)
 VISIT SUMMARY:  You came in today for a routine follow-up visit. You reported no new concerns and are generally doing well. We reviewed your conditions, including hypertension, COPD, heart failure, diabetes, and end-stage renal disease. You are currently on 4 liters of oxygen  and use a ventilator-like device at night. You have not experienced any recent exacerbations of COPD or heart failure symptoms. Your diabetes is well controlled with an A1c of 5.9. You recently had a procedure on your arm related to your dialysis fistula and have seen an eye doctor within the past year.  YOUR PLAN:  -DIABETES MELLITUS: Your diabetes is well controlled with an A1c of 5.9. Continue your current regimen with Ozempic , and we have refilled your prescription.  -CHRONIC OBSTRUCTIVE PULMONARY DISEASE (COPD): COPD is a chronic lung condition that makes it hard to breathe. You are currently stable with no symptoms of wheezing or shortness of breath. Continue your current regimen, including 4 liters of oxygen  and using a ventilator at night.  -HEART FAILURE: Heart failure means your heart is not pumping blood as well as it should. You have no current symptoms of leg swelling. Continue your current regimen.  -HYPERLIPIDEMIA: Hyperlipidemia means you have high cholesterol levels. Continue your current cholesterol medication.  -GENERAL HEALTH MAINTENANCE: Your annual foot exam showed good circulation and sensation. You have also had an eye exam within the past year. We will refill your Robaxin  prescription as needed and assist with your SCAT bus evaluation renewal.  INSTRUCTIONS:  Continue with your current medications and treatments. Follow up with your cardiologist to ensure your heart failure is well managed. If you experience any new symptoms or have any concerns, please contact our office.

## 2023-06-05 DIAGNOSIS — N2581 Secondary hyperparathyroidism of renal origin: Secondary | ICD-10-CM | POA: Diagnosis not present

## 2023-06-05 DIAGNOSIS — Z992 Dependence on renal dialysis: Secondary | ICD-10-CM | POA: Diagnosis not present

## 2023-06-05 DIAGNOSIS — N186 End stage renal disease: Secondary | ICD-10-CM | POA: Diagnosis not present

## 2023-06-10 DIAGNOSIS — N2581 Secondary hyperparathyroidism of renal origin: Secondary | ICD-10-CM | POA: Diagnosis not present

## 2023-06-10 DIAGNOSIS — N186 End stage renal disease: Secondary | ICD-10-CM | POA: Diagnosis not present

## 2023-06-10 DIAGNOSIS — Z992 Dependence on renal dialysis: Secondary | ICD-10-CM | POA: Diagnosis not present

## 2023-06-12 DIAGNOSIS — Z992 Dependence on renal dialysis: Secondary | ICD-10-CM | POA: Diagnosis not present

## 2023-06-12 DIAGNOSIS — N186 End stage renal disease: Secondary | ICD-10-CM | POA: Diagnosis not present

## 2023-06-12 DIAGNOSIS — N2581 Secondary hyperparathyroidism of renal origin: Secondary | ICD-10-CM | POA: Diagnosis not present

## 2023-06-14 DIAGNOSIS — N186 End stage renal disease: Secondary | ICD-10-CM | POA: Diagnosis not present

## 2023-06-14 DIAGNOSIS — Z992 Dependence on renal dialysis: Secondary | ICD-10-CM | POA: Diagnosis not present

## 2023-06-14 DIAGNOSIS — N2581 Secondary hyperparathyroidism of renal origin: Secondary | ICD-10-CM | POA: Diagnosis not present

## 2023-06-17 DIAGNOSIS — N186 End stage renal disease: Secondary | ICD-10-CM | POA: Diagnosis not present

## 2023-06-17 DIAGNOSIS — N2581 Secondary hyperparathyroidism of renal origin: Secondary | ICD-10-CM | POA: Diagnosis not present

## 2023-06-17 DIAGNOSIS — Z992 Dependence on renal dialysis: Secondary | ICD-10-CM | POA: Diagnosis not present

## 2023-06-19 DIAGNOSIS — Z992 Dependence on renal dialysis: Secondary | ICD-10-CM | POA: Diagnosis not present

## 2023-06-19 DIAGNOSIS — N186 End stage renal disease: Secondary | ICD-10-CM | POA: Diagnosis not present

## 2023-06-19 DIAGNOSIS — N2581 Secondary hyperparathyroidism of renal origin: Secondary | ICD-10-CM | POA: Diagnosis not present

## 2023-06-21 DIAGNOSIS — N186 End stage renal disease: Secondary | ICD-10-CM | POA: Diagnosis not present

## 2023-06-21 DIAGNOSIS — N2581 Secondary hyperparathyroidism of renal origin: Secondary | ICD-10-CM | POA: Diagnosis not present

## 2023-06-21 DIAGNOSIS — Z992 Dependence on renal dialysis: Secondary | ICD-10-CM | POA: Diagnosis not present

## 2023-06-24 DIAGNOSIS — N186 End stage renal disease: Secondary | ICD-10-CM | POA: Diagnosis not present

## 2023-06-24 DIAGNOSIS — Z992 Dependence on renal dialysis: Secondary | ICD-10-CM | POA: Diagnosis not present

## 2023-06-24 DIAGNOSIS — N2581 Secondary hyperparathyroidism of renal origin: Secondary | ICD-10-CM | POA: Diagnosis not present

## 2023-06-26 DIAGNOSIS — N2581 Secondary hyperparathyroidism of renal origin: Secondary | ICD-10-CM | POA: Diagnosis not present

## 2023-06-26 DIAGNOSIS — Z992 Dependence on renal dialysis: Secondary | ICD-10-CM | POA: Diagnosis not present

## 2023-06-26 DIAGNOSIS — N186 End stage renal disease: Secondary | ICD-10-CM | POA: Diagnosis not present

## 2023-06-27 DIAGNOSIS — E1122 Type 2 diabetes mellitus with diabetic chronic kidney disease: Secondary | ICD-10-CM | POA: Diagnosis not present

## 2023-06-27 DIAGNOSIS — N186 End stage renal disease: Secondary | ICD-10-CM | POA: Diagnosis not present

## 2023-06-27 DIAGNOSIS — Z992 Dependence on renal dialysis: Secondary | ICD-10-CM | POA: Diagnosis not present

## 2023-06-28 DIAGNOSIS — Z992 Dependence on renal dialysis: Secondary | ICD-10-CM | POA: Diagnosis not present

## 2023-06-28 DIAGNOSIS — N2581 Secondary hyperparathyroidism of renal origin: Secondary | ICD-10-CM | POA: Diagnosis not present

## 2023-06-28 DIAGNOSIS — N186 End stage renal disease: Secondary | ICD-10-CM | POA: Diagnosis not present

## 2023-07-01 DIAGNOSIS — Z992 Dependence on renal dialysis: Secondary | ICD-10-CM | POA: Diagnosis not present

## 2023-07-01 DIAGNOSIS — N186 End stage renal disease: Secondary | ICD-10-CM | POA: Diagnosis not present

## 2023-07-01 DIAGNOSIS — N2581 Secondary hyperparathyroidism of renal origin: Secondary | ICD-10-CM | POA: Diagnosis not present

## 2023-07-03 DIAGNOSIS — Z992 Dependence on renal dialysis: Secondary | ICD-10-CM | POA: Diagnosis not present

## 2023-07-03 DIAGNOSIS — N186 End stage renal disease: Secondary | ICD-10-CM | POA: Diagnosis not present

## 2023-07-03 DIAGNOSIS — N2581 Secondary hyperparathyroidism of renal origin: Secondary | ICD-10-CM | POA: Diagnosis not present

## 2023-07-05 DIAGNOSIS — N2581 Secondary hyperparathyroidism of renal origin: Secondary | ICD-10-CM | POA: Diagnosis not present

## 2023-07-05 DIAGNOSIS — Z992 Dependence on renal dialysis: Secondary | ICD-10-CM | POA: Diagnosis not present

## 2023-07-05 DIAGNOSIS — N186 End stage renal disease: Secondary | ICD-10-CM | POA: Diagnosis not present

## 2023-07-08 DIAGNOSIS — N2581 Secondary hyperparathyroidism of renal origin: Secondary | ICD-10-CM | POA: Diagnosis not present

## 2023-07-08 DIAGNOSIS — N186 End stage renal disease: Secondary | ICD-10-CM | POA: Diagnosis not present

## 2023-07-08 DIAGNOSIS — Z992 Dependence on renal dialysis: Secondary | ICD-10-CM | POA: Diagnosis not present

## 2023-07-10 DIAGNOSIS — Z992 Dependence on renal dialysis: Secondary | ICD-10-CM | POA: Diagnosis not present

## 2023-07-10 DIAGNOSIS — N186 End stage renal disease: Secondary | ICD-10-CM | POA: Diagnosis not present

## 2023-07-10 DIAGNOSIS — N2581 Secondary hyperparathyroidism of renal origin: Secondary | ICD-10-CM | POA: Diagnosis not present

## 2023-07-12 DIAGNOSIS — Z992 Dependence on renal dialysis: Secondary | ICD-10-CM | POA: Diagnosis not present

## 2023-07-12 DIAGNOSIS — N2581 Secondary hyperparathyroidism of renal origin: Secondary | ICD-10-CM | POA: Diagnosis not present

## 2023-07-12 DIAGNOSIS — N186 End stage renal disease: Secondary | ICD-10-CM | POA: Diagnosis not present

## 2023-07-15 DIAGNOSIS — Z992 Dependence on renal dialysis: Secondary | ICD-10-CM | POA: Diagnosis not present

## 2023-07-15 DIAGNOSIS — N186 End stage renal disease: Secondary | ICD-10-CM | POA: Diagnosis not present

## 2023-07-15 DIAGNOSIS — N2581 Secondary hyperparathyroidism of renal origin: Secondary | ICD-10-CM | POA: Diagnosis not present

## 2023-07-16 ENCOUNTER — Telehealth: Payer: Self-pay

## 2023-07-16 ENCOUNTER — Telehealth: Payer: Self-pay | Admitting: Family Medicine

## 2023-07-16 NOTE — Telephone Encounter (Signed)
Copied from CRM [id number]. Topic: General - Other >> Jul 16, 2023  4:52 PM Higinio Roger wrote: Reason for CRM: Patient states he no longer needs the Part [employer] of [employer] completed and said he received his letter a week ago.

## 2023-07-16 NOTE — Telephone Encounter (Signed)
Incomplete Part [place] of [employer] application received.  It was submitted by Jerrye Noble, LCSW who is not in this clinic. I called the patient to inquire if he has been approved for [employer] or if I need to complete Part B of the application for him.  Message left with call back requested

## 2023-07-16 NOTE — Telephone Encounter (Signed)
I called patient just to confirm he did not need Part [place] of [employer] application and that he received his approval notice.  I left a message and asked him to call me back if he needs  anything else for the application, otherwise, I will disregard completing Part B.

## 2023-07-17 DIAGNOSIS — N186 End stage renal disease: Secondary | ICD-10-CM | POA: Diagnosis not present

## 2023-07-17 DIAGNOSIS — N2581 Secondary hyperparathyroidism of renal origin: Secondary | ICD-10-CM | POA: Diagnosis not present

## 2023-07-17 DIAGNOSIS — Z992 Dependence on renal dialysis: Secondary | ICD-10-CM | POA: Diagnosis not present

## 2023-07-19 DIAGNOSIS — Z992 Dependence on renal dialysis: Secondary | ICD-10-CM | POA: Diagnosis not present

## 2023-07-19 DIAGNOSIS — N2581 Secondary hyperparathyroidism of renal origin: Secondary | ICD-10-CM | POA: Diagnosis not present

## 2023-07-19 DIAGNOSIS — N186 End stage renal disease: Secondary | ICD-10-CM | POA: Diagnosis not present

## 2023-07-22 DIAGNOSIS — N186 End stage renal disease: Secondary | ICD-10-CM | POA: Diagnosis not present

## 2023-07-22 DIAGNOSIS — Z992 Dependence on renal dialysis: Secondary | ICD-10-CM | POA: Diagnosis not present

## 2023-07-22 DIAGNOSIS — N2581 Secondary hyperparathyroidism of renal origin: Secondary | ICD-10-CM | POA: Diagnosis not present

## 2023-07-23 ENCOUNTER — Encounter: Payer: Self-pay | Admitting: Pulmonary Disease

## 2023-07-23 ENCOUNTER — Ambulatory Visit (INDEPENDENT_AMBULATORY_CARE_PROVIDER_SITE_OTHER): Payer: Medicare HMO | Admitting: Pulmonary Disease

## 2023-07-23 VITALS — BP 142/84 | HR 88 | Temp 97.5°F | Ht 72.0 in | Wt 314.2 lb

## 2023-07-23 DIAGNOSIS — J9611 Chronic respiratory failure with hypoxia: Secondary | ICD-10-CM

## 2023-07-23 DIAGNOSIS — G4733 Obstructive sleep apnea (adult) (pediatric): Secondary | ICD-10-CM

## 2023-07-23 DIAGNOSIS — J9612 Chronic respiratory failure with hypercapnia: Secondary | ICD-10-CM | POA: Diagnosis not present

## 2023-07-23 NOTE — Progress Notes (Signed)
 @Patient  ID: Adam Dennis, male    DOB: 1961/01/08, 63 y.o.   MRN: [id number]  Chief Complaint  Patient presents with   Follow-up    Doing well.  Sleeping good.    Referring provider: Hoy Register, MD  HPI:   63 y.o. man whom are seeing in follow-up for chronic hypoxemic respiratory failure and OHS/OSA. Most recent PCP note reviewed.  Overall doing well.  Oxygen saturation doing well.  Adherent to 4 L pulsed via POC.  No issues.  Reports adherence to NIPPV at night.  Reviewed compliance report.  Indicates excellent adherence.  Weight is stable.  This is good news.  HPI at initial visit: Patient states he needed oxygen in the past.  Had occurred.  Time that went away.  Unclear why this was, whether he no longer needed or just no longer had access to it.  He was hospitalized in late 12/2021 for respiratory failure.  There was concern for COPD exacerbation.  He was given antibiotics and steroids.  Notably, he has no history of COPD.  PFTs in 2018 that did not demonstrate fixed obstruction.  He had a CTA PE protocol performed at that time that on my review and interpretation reveals clear lungs although motion artifact degrades and inhibits full interpretation, no evidence of PE.  He had a CTA PE protocol performed 10/2021 in addition, prior to hospitalization, but on my review interpretation reveals clear lungs bilaterally, no evidence of emphysema or interstitial abnormality, no edema or pleural effusion, no PE.  Review of labs indicate mildly elevated bicarbonate.  He does carry a diagnosis of OSA based on polysomnography.  He was initially on CPAP.  Simply transition to trilogy ventilator.  He reports good adherence to NIPPV at night when he sleeps.  He has shortness of breath.  It comes and goes.  Worse when he is not wearing his oxygen.  When he wears his oxygen he does okay.  At rest he has no issues.  No time of day when things are better or worse.  No position that makes things better  or worse.  No seasonal environmental factors he can identify that makes it better or worse.  He has used several different inhalers in the past.  None of them helped his symptoms of dyspnea.  No other alleviating or exacerbating factors.  Most recent TTE 10/2021 that on my review indicates normal RV size, normal RV function, normal RA size, normal estimated right-sided pressures.  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 [employer] Family History  Problem Relation Age of Onset   Hypertension Mother    Diabetes Sister    Colon cancer Sister    Diabetes Brother    Cancer Maternal Aunt    Amblyopia Neg Hx    Blindness Neg Hx    Cataracts Neg Hx    Glaucoma Neg Hx    Macular degeneration Neg Hx    Strabismus Neg Hx    Retinal detachment Neg Hx    Retinitis pigmentosa Neg Hx        Questionaires / Pulmonary Flowsheets:   ACT:      No data to display  MMRC:     No data to display          Epworth:      No data to display          Tests:   FENO:  No results found for: "NITRICOXIDE"  PFT:    Latest Ref Rng & Units 02/19/2017    9:25 AM  PFT Results  FVC-Pre L 1.17   FVC-Predicted Pre % 28   FVC-Post L 1.21   FVC-Predicted Post % 29   Pre FEV1/FVC % % 85   Post FEV1/FCV % % 84   FEV1-Pre L 1.00   FEV1-Predicted Pre % 30   FEV1-Post L 1.03   DLCO uncorrected ml/min/mmHg 11.58   DLCO UNC% % 35   DLCO corrected ml/min/mmHg 12.87   DLCO COR %Predicted % 39   DLVA Predicted % 119   TLC L 3.02   TLC % Predicted % 43   RV % Predicted % 83   Personally reviewed interpreted as no fixed obstruction, no bronchodilator  response, severely reduced TLC, severe restriction  WALK:      No data to display          Imaging: Personally reviewed and as per EMR discussion this note No results found.  Lab Results: Personally reviewed CBC    Component Value Date/Time   WBC 5.5 01/26/2022 0802   RBC 4.28 01/26/2022 0802   HGB 14.3 04/30/2023 0824   HGB 12.8 (L) 03/24/2019 1017   HCT 42.0 04/30/2023 0824   HCT 39.7 03/24/2019 1017   PLT 257 01/26/2022 0802   PLT 191 03/24/2019 1017   MCV 88.8 01/26/2022 0802   MCV 87 03/24/2019 1017   MCH 26.9 01/26/2022 0802   MCHC 30.3 01/26/2022 0802   RDW 17.5 (H) 01/26/2022 0802   RDW 13.9 03/24/2019 1017   LYMPHSABS 1.0 01/21/2022 0845   LYMPHSABS 1.3 03/24/2019 1017   MONOABS 0.6 01/21/2022 0845   EOSABS 0.1 01/21/2022 0845   EOSABS 0.0 03/24/2019 1017   BASOSABS 0.0 01/21/2022 0845   BASOSABS 0.0 03/24/2019 1017    BMET    Component Value Date/Time   NA 141 04/30/2023 0824   NA 141 03/24/2019 1017   K 4.8 04/30/2023 0824   CL 100 04/30/2023 0824   CO2 29 01/26/2022 0802   GLUCOSE 92 04/30/2023 0824   BUN 31 (H) 04/30/2023 0824   BUN 36 (H) 03/24/2019 1017   CREATININE 8.20 (H) 04/30/2023 0824   CREATININE 2.89 (H) 11/30/2015 1250   CALCIUM 9.4 01/26/2022 0802   GFRNONAA 5 (L) 01/26/2022 0802   GFRNONAA 24 (L) 11/30/2015 1250   GFRAA 8 (L) 07/06/2019 0754   GFRAA 27 (L) 11/30/2015 1250    BNP    Component Value Date/Time   BNP 142.6 (H) 01/23/2022 1134    ProBNP    Component Value Date/Time   PROBNP 227.1 (H) 11/12/2013 0953    Specialty Problems       Pulmonary Problems   HYPERTROPHY, TONSILS   Qualifier: Diagnosis of  By: Maple Hudson MD, Clinton D       Chronic respiratory failure (HCC)    No Known Allergies  Immunization History  Administered Date(s) Administered   Influenza Whole 06/02/2009   Influenza,inj,Quad PF,6+ Mos 02/15/2013, 02/10/2015, 02/07/2017, 02/25/2022   PNEUMOCOCCAL CONJUGATE-20 04/01/2022    Pneumococcal Polysaccharide-23 12/13/2012   Tdap 06/26/2015   Zoster Recombinant(Shingrix) 01/08/2021, 03/12/2021    Past Medical History:  Diagnosis Date   Arthritis    "  back" (02/06/2017)   CHF (congestive heart failure) (HCC)    new onset /Encounter Date 09/12/2006   Chronic combined systolic and diastolic heart failure (HCC)    CKD (chronic kidney disease) stage 4, GFR 15-29 ml/min (HCC)    COPD (chronic obstructive pulmonary disease) (HCC)    End stage renal disease on dialysis [facility])    Tues/ Thur/ Sat [address]   Glaucoma, right eye    High cholesterol    Hypertension    Hypertrophy of tonsils alone    Myocardial infarction (HCC)    "small one; ~ 2008" (02/06/2017)   Obesity, unspecified    On home oxygen therapy    "4L; 24/7" (04/10/2017)   OSA on CPAP    Type II diabetes mellitus (HCC)     Tobacco History: Social History   Tobacco Use  Smoking Status Never  Smokeless Tobacco Never   Counseling given: Not Answered   Continue to not smoke  Outpatient Encounter Medications as of 07/23/2023  Medication Sig   ACCU-CHEK SOFTCLIX LANCETS lancets Use as instructed to check blood sugar up to 3 times daily.   amLODipine (NORVASC) 5 MG tablet Take 1 tablet (5 mg total) by mouth daily.   aspirin EC 81 MG tablet Take 81 mg by mouth daily.   atorvastatin (LIPITOR) 40 MG tablet TAKE 1 TABLET(40 MG) BY MOUTH DAILY   Blood Glucose Monitoring Suppl (ACCU-CHEK AVIVA) device Use as instructed to check blood sugar up to 3 times daily.   dorzolamide-timolol (COSOPT) 22.3-6.8 MG/ML ophthalmic solution Place 1 drop into both eyes 2 (two) times daily.   FEROSUL 325 (65 Fe) MG tablet TAKE 1 TABLET(325 MG) BY MOUTH DAILY WITH BREAKFAST (Patient taking differently: Take 325 mg by mouth Every Tuesday,Thursday,and Saturday with dialysis.)   glucose blood (ACCU-CHEK AVIVA PLUS) test strip Use as instructed to check blood sugar up to 3 times daily.   Lancet Devices (ACCU-CHEK SOFTCLIX)  lancets Use as instructed daily.   methocarbamol (ROBAXIN) 500 MG tablet Take 1 tablet (500 mg total) by mouth every 8 (eight) hours as needed for muscle spasms.   nitroGLYCERIN (NITROSTAT) 0.4 MG SL tablet PLACE 1 TABLET BY MOUTH UNDER TONGUE EVERY 5 MINUTES FOR 3 DOSES AS NEEDED FOR CHEST PAIN (Patient taking differently: Place 0.4 mg under the tongue every 5 (five) minutes as needed for chest pain.)   Semaglutide, 1 MG/DOSE, (OZEMPIC, 1 MG/DOSE,) 4 MG/3ML SOPN Inject 1 mg into the skin once a week.   sevelamer carbonate (RENVELA) 800 MG tablet Take 1,600-2,400 mg by mouth See admin instructions. 2400 mg with meals, and 1600 mg with snacks   TRAVATAN Z 0.004 % SOLN ophthalmic solution Place 1 drop into both eyes at bedtime.   Facility-Administered Encounter Medications as of 07/23/2023  Medication   sodium chloride flush (NS) 0.9 % injection 3 mL   sodium chloride flush (NS) 0.9 % injection 3 mL     Review of Systems  Review of Systems  N/a Physical Exam  BP (!) 142/84 (BP Location: Left Arm, Patient Position: Sitting, Cuff Size: Large)   Pulse 88   Temp (!) 97.5 F (36.4 C) (Oral)   Ht 6' (1.829 m)   Wt (!) 314 lb 3.2 oz (142.5 kg)   SpO2 96% Comment: 4L POC pulsed oxygen  BMI 42.61 kg/m   Wt Readings from Last 5 Encounters:  07/23/23 (!) 314 lb 3.2 oz (142.5 kg)  06/04/23 (!) 315 lb 3.2 oz (143 kg)  04/30/23 (!) 314  lb 6 oz (142.6 kg)  04/22/23 (!) 314 lb 6 oz (142.6 kg)  03/19/23 (!) 320 lb (145.2 kg)    BMI Readings from Last 5 Encounters:  07/23/23 42.61 kg/m  06/04/23 42.75 kg/m  04/30/23 42.64 kg/m  04/22/23 42.64 kg/m  03/19/23 43.40 kg/m     Physical Exam General: Sitting in chair, no acute distress Eyes: EOMI, no icterus Neck: Supple, no JVP Pulmonary: Distant, clear, normal work of breathing Cardiovascular: Warm, no edema Abdomen: Distended, bowel sounds present MSK: No synovitis, no joint effusion Neuro: Normal gait, no weakness Psych: Normal  mood, full affect   Assessment & Plan:   Chronic hypoxemic respiratory failure: Stable on 4 L via pulsed, POC.  Suspect largely driven by OHS/OSA given his habitus as well as PFTs 2018 with severe restriction and severely reduced ERV.  There is no evidence of COPD on his prior PFTs, he is a never smoker, I do not think his exacerbation for breathing related to COPD.  Possibly has underlying asthma although he denies significant atopic symptoms or history of the same.  He has used multiple inhalers in the past without any significant positive effect on his shortness of breath.  It is possible he has intermittent volume overload between dialysis sessions although recent CT scan does not show any sign of pulmonary edema or pleural effusion.  Fortunately, recent TTE 10/2021 reveals normal RV size and function, normal RA size, normal estimated RVSP.  Recommended weight loss.  OSA on NIPPV: Previously on CPAP.  Now using trilogy ventilator AVAPS with 4 L O2 bleed in.  Recommend strict adherence to NIPPV at night or anytime he sleeping.  Compliance report reviewed today, 100% compliance, using 12+ hours a day.  He was congratulated on excellent use.  Encouraged to continue.  Counseled on the importance of helping with fluid retention as well as mitigating or decreasing risk of pulmonary hypertension.   Return in about 1 year (around 07/22/2024) for f/u Dr. Judeth Horn.   Karren Burly, MD 07/23/2023

## 2023-07-24 DIAGNOSIS — N186 End stage renal disease: Secondary | ICD-10-CM | POA: Diagnosis not present

## 2023-07-24 DIAGNOSIS — N2581 Secondary hyperparathyroidism of renal origin: Secondary | ICD-10-CM | POA: Diagnosis not present

## 2023-07-24 DIAGNOSIS — Z992 Dependence on renal dialysis: Secondary | ICD-10-CM | POA: Diagnosis not present

## 2023-07-25 DIAGNOSIS — N186 End stage renal disease: Secondary | ICD-10-CM | POA: Diagnosis not present

## 2023-07-25 DIAGNOSIS — E1122 Type 2 diabetes mellitus with diabetic chronic kidney disease: Secondary | ICD-10-CM | POA: Diagnosis not present

## 2023-07-25 DIAGNOSIS — Z992 Dependence on renal dialysis: Secondary | ICD-10-CM | POA: Diagnosis not present

## 2023-07-26 DIAGNOSIS — Z992 Dependence on renal dialysis: Secondary | ICD-10-CM | POA: Diagnosis not present

## 2023-07-26 DIAGNOSIS — N186 End stage renal disease: Secondary | ICD-10-CM | POA: Diagnosis not present

## 2023-07-26 DIAGNOSIS — N2581 Secondary hyperparathyroidism of renal origin: Secondary | ICD-10-CM | POA: Diagnosis not present

## 2023-07-29 DIAGNOSIS — N2581 Secondary hyperparathyroidism of renal origin: Secondary | ICD-10-CM | POA: Diagnosis not present

## 2023-07-29 DIAGNOSIS — Z992 Dependence on renal dialysis: Secondary | ICD-10-CM | POA: Diagnosis not present

## 2023-07-29 DIAGNOSIS — N186 End stage renal disease: Secondary | ICD-10-CM | POA: Diagnosis not present

## 2023-07-31 DIAGNOSIS — N186 End stage renal disease: Secondary | ICD-10-CM | POA: Diagnosis not present

## 2023-07-31 DIAGNOSIS — N2581 Secondary hyperparathyroidism of renal origin: Secondary | ICD-10-CM | POA: Diagnosis not present

## 2023-07-31 DIAGNOSIS — Z992 Dependence on renal dialysis: Secondary | ICD-10-CM | POA: Diagnosis not present

## 2023-08-02 DIAGNOSIS — N186 End stage renal disease: Secondary | ICD-10-CM | POA: Diagnosis not present

## 2023-08-02 DIAGNOSIS — N2581 Secondary hyperparathyroidism of renal origin: Secondary | ICD-10-CM | POA: Diagnosis not present

## 2023-08-02 DIAGNOSIS — Z992 Dependence on renal dialysis: Secondary | ICD-10-CM | POA: Diagnosis not present

## 2023-08-05 DIAGNOSIS — N186 End stage renal disease: Secondary | ICD-10-CM | POA: Diagnosis not present

## 2023-08-05 DIAGNOSIS — Z992 Dependence on renal dialysis: Secondary | ICD-10-CM | POA: Diagnosis not present

## 2023-08-05 DIAGNOSIS — N2581 Secondary hyperparathyroidism of renal origin: Secondary | ICD-10-CM | POA: Diagnosis not present

## 2023-08-07 DIAGNOSIS — N186 End stage renal disease: Secondary | ICD-10-CM | POA: Diagnosis not present

## 2023-08-07 DIAGNOSIS — N2581 Secondary hyperparathyroidism of renal origin: Secondary | ICD-10-CM | POA: Diagnosis not present

## 2023-08-07 DIAGNOSIS — Z992 Dependence on renal dialysis: Secondary | ICD-10-CM | POA: Diagnosis not present

## 2023-08-09 DIAGNOSIS — N186 End stage renal disease: Secondary | ICD-10-CM | POA: Diagnosis not present

## 2023-08-09 DIAGNOSIS — N2581 Secondary hyperparathyroidism of renal origin: Secondary | ICD-10-CM | POA: Diagnosis not present

## 2023-08-09 DIAGNOSIS — Z992 Dependence on renal dialysis: Secondary | ICD-10-CM | POA: Diagnosis not present

## 2023-08-12 DIAGNOSIS — N186 End stage renal disease: Secondary | ICD-10-CM | POA: Diagnosis not present

## 2023-08-12 DIAGNOSIS — N2581 Secondary hyperparathyroidism of renal origin: Secondary | ICD-10-CM | POA: Diagnosis not present

## 2023-08-12 DIAGNOSIS — Z992 Dependence on renal dialysis: Secondary | ICD-10-CM | POA: Diagnosis not present

## 2023-08-14 DIAGNOSIS — N2581 Secondary hyperparathyroidism of renal origin: Secondary | ICD-10-CM | POA: Diagnosis not present

## 2023-08-14 DIAGNOSIS — N186 End stage renal disease: Secondary | ICD-10-CM | POA: Diagnosis not present

## 2023-08-14 DIAGNOSIS — Z992 Dependence on renal dialysis: Secondary | ICD-10-CM | POA: Diagnosis not present

## 2023-08-16 DIAGNOSIS — N186 End stage renal disease: Secondary | ICD-10-CM | POA: Diagnosis not present

## 2023-08-16 DIAGNOSIS — N2581 Secondary hyperparathyroidism of renal origin: Secondary | ICD-10-CM | POA: Diagnosis not present

## 2023-08-16 DIAGNOSIS — Z992 Dependence on renal dialysis: Secondary | ICD-10-CM | POA: Diagnosis not present

## 2023-08-19 DIAGNOSIS — Z992 Dependence on renal dialysis: Secondary | ICD-10-CM | POA: Diagnosis not present

## 2023-08-19 DIAGNOSIS — N2581 Secondary hyperparathyroidism of renal origin: Secondary | ICD-10-CM | POA: Diagnosis not present

## 2023-08-19 DIAGNOSIS — N186 End stage renal disease: Secondary | ICD-10-CM | POA: Diagnosis not present

## 2023-08-21 DIAGNOSIS — N2581 Secondary hyperparathyroidism of renal origin: Secondary | ICD-10-CM | POA: Diagnosis not present

## 2023-08-21 DIAGNOSIS — N186 End stage renal disease: Secondary | ICD-10-CM | POA: Diagnosis not present

## 2023-08-21 DIAGNOSIS — Z992 Dependence on renal dialysis: Secondary | ICD-10-CM | POA: Diagnosis not present

## 2023-08-23 DIAGNOSIS — N2581 Secondary hyperparathyroidism of renal origin: Secondary | ICD-10-CM | POA: Diagnosis not present

## 2023-08-23 DIAGNOSIS — Z992 Dependence on renal dialysis: Secondary | ICD-10-CM | POA: Diagnosis not present

## 2023-08-23 DIAGNOSIS — N186 End stage renal disease: Secondary | ICD-10-CM | POA: Diagnosis not present

## 2023-08-25 DIAGNOSIS — Z992 Dependence on renal dialysis: Secondary | ICD-10-CM | POA: Diagnosis not present

## 2023-08-25 DIAGNOSIS — N186 End stage renal disease: Secondary | ICD-10-CM | POA: Diagnosis not present

## 2023-08-25 DIAGNOSIS — E1122 Type 2 diabetes mellitus with diabetic chronic kidney disease: Secondary | ICD-10-CM | POA: Diagnosis not present

## 2023-08-26 DIAGNOSIS — N2581 Secondary hyperparathyroidism of renal origin: Secondary | ICD-10-CM | POA: Diagnosis not present

## 2023-08-26 DIAGNOSIS — Z992 Dependence on renal dialysis: Secondary | ICD-10-CM | POA: Diagnosis not present

## 2023-08-26 DIAGNOSIS — N186 End stage renal disease: Secondary | ICD-10-CM | POA: Diagnosis not present

## 2023-08-28 DIAGNOSIS — Z992 Dependence on renal dialysis: Secondary | ICD-10-CM | POA: Diagnosis not present

## 2023-08-28 DIAGNOSIS — N186 End stage renal disease: Secondary | ICD-10-CM | POA: Diagnosis not present

## 2023-08-28 DIAGNOSIS — N2581 Secondary hyperparathyroidism of renal origin: Secondary | ICD-10-CM | POA: Diagnosis not present

## 2023-08-30 DIAGNOSIS — N2581 Secondary hyperparathyroidism of renal origin: Secondary | ICD-10-CM | POA: Diagnosis not present

## 2023-08-30 DIAGNOSIS — N186 End stage renal disease: Secondary | ICD-10-CM | POA: Diagnosis not present

## 2023-08-30 DIAGNOSIS — Z992 Dependence on renal dialysis: Secondary | ICD-10-CM | POA: Diagnosis not present

## 2023-09-02 DIAGNOSIS — Z992 Dependence on renal dialysis: Secondary | ICD-10-CM | POA: Diagnosis not present

## 2023-09-02 DIAGNOSIS — N186 End stage renal disease: Secondary | ICD-10-CM | POA: Diagnosis not present

## 2023-09-02 DIAGNOSIS — N2581 Secondary hyperparathyroidism of renal origin: Secondary | ICD-10-CM | POA: Diagnosis not present

## 2023-09-03 DIAGNOSIS — N186 End stage renal disease: Secondary | ICD-10-CM | POA: Diagnosis not present

## 2023-09-03 DIAGNOSIS — Z992 Dependence on renal dialysis: Secondary | ICD-10-CM | POA: Diagnosis not present

## 2023-09-03 DIAGNOSIS — N2581 Secondary hyperparathyroidism of renal origin: Secondary | ICD-10-CM | POA: Diagnosis not present

## 2023-09-04 DIAGNOSIS — N2581 Secondary hyperparathyroidism of renal origin: Secondary | ICD-10-CM | POA: Diagnosis not present

## 2023-09-04 DIAGNOSIS — Z992 Dependence on renal dialysis: Secondary | ICD-10-CM | POA: Diagnosis not present

## 2023-09-04 DIAGNOSIS — N186 End stage renal disease: Secondary | ICD-10-CM | POA: Diagnosis not present

## 2023-09-06 DIAGNOSIS — N186 End stage renal disease: Secondary | ICD-10-CM | POA: Diagnosis not present

## 2023-09-06 DIAGNOSIS — N2581 Secondary hyperparathyroidism of renal origin: Secondary | ICD-10-CM | POA: Diagnosis not present

## 2023-09-06 DIAGNOSIS — Z992 Dependence on renal dialysis: Secondary | ICD-10-CM | POA: Diagnosis not present

## 2023-09-09 DIAGNOSIS — Z992 Dependence on renal dialysis: Secondary | ICD-10-CM | POA: Diagnosis not present

## 2023-09-09 DIAGNOSIS — N2581 Secondary hyperparathyroidism of renal origin: Secondary | ICD-10-CM | POA: Diagnosis not present

## 2023-09-09 DIAGNOSIS — N186 End stage renal disease: Secondary | ICD-10-CM | POA: Diagnosis not present

## 2023-09-11 DIAGNOSIS — N2581 Secondary hyperparathyroidism of renal origin: Secondary | ICD-10-CM | POA: Diagnosis not present

## 2023-09-11 DIAGNOSIS — Z992 Dependence on renal dialysis: Secondary | ICD-10-CM | POA: Diagnosis not present

## 2023-09-11 DIAGNOSIS — N186 End stage renal disease: Secondary | ICD-10-CM | POA: Diagnosis not present

## 2023-09-13 DIAGNOSIS — N2581 Secondary hyperparathyroidism of renal origin: Secondary | ICD-10-CM | POA: Diagnosis not present

## 2023-09-13 DIAGNOSIS — N186 End stage renal disease: Secondary | ICD-10-CM | POA: Diagnosis not present

## 2023-09-13 DIAGNOSIS — Z992 Dependence on renal dialysis: Secondary | ICD-10-CM | POA: Diagnosis not present

## 2023-09-16 DIAGNOSIS — N2581 Secondary hyperparathyroidism of renal origin: Secondary | ICD-10-CM | POA: Diagnosis not present

## 2023-09-16 DIAGNOSIS — Z992 Dependence on renal dialysis: Secondary | ICD-10-CM | POA: Diagnosis not present

## 2023-09-16 DIAGNOSIS — N186 End stage renal disease: Secondary | ICD-10-CM | POA: Diagnosis not present

## 2023-09-18 DIAGNOSIS — Z992 Dependence on renal dialysis: Secondary | ICD-10-CM | POA: Diagnosis not present

## 2023-09-18 DIAGNOSIS — N2581 Secondary hyperparathyroidism of renal origin: Secondary | ICD-10-CM | POA: Diagnosis not present

## 2023-09-18 DIAGNOSIS — N186 End stage renal disease: Secondary | ICD-10-CM | POA: Diagnosis not present

## 2023-09-20 DIAGNOSIS — Z992 Dependence on renal dialysis: Secondary | ICD-10-CM | POA: Diagnosis not present

## 2023-09-20 DIAGNOSIS — N186 End stage renal disease: Secondary | ICD-10-CM | POA: Diagnosis not present

## 2023-09-20 DIAGNOSIS — N2581 Secondary hyperparathyroidism of renal origin: Secondary | ICD-10-CM | POA: Diagnosis not present

## 2023-09-23 DIAGNOSIS — Z992 Dependence on renal dialysis: Secondary | ICD-10-CM | POA: Diagnosis not present

## 2023-09-23 DIAGNOSIS — N2581 Secondary hyperparathyroidism of renal origin: Secondary | ICD-10-CM | POA: Diagnosis not present

## 2023-09-23 DIAGNOSIS — N186 End stage renal disease: Secondary | ICD-10-CM | POA: Diagnosis not present

## 2023-09-24 DIAGNOSIS — E1122 Type 2 diabetes mellitus with diabetic chronic kidney disease: Secondary | ICD-10-CM | POA: Diagnosis not present

## 2023-09-24 DIAGNOSIS — Z992 Dependence on renal dialysis: Secondary | ICD-10-CM | POA: Diagnosis not present

## 2023-09-24 DIAGNOSIS — N186 End stage renal disease: Secondary | ICD-10-CM | POA: Diagnosis not present

## 2023-09-25 DIAGNOSIS — Z992 Dependence on renal dialysis: Secondary | ICD-10-CM | POA: Diagnosis not present

## 2023-09-25 DIAGNOSIS — N2581 Secondary hyperparathyroidism of renal origin: Secondary | ICD-10-CM | POA: Diagnosis not present

## 2023-09-25 DIAGNOSIS — N186 End stage renal disease: Secondary | ICD-10-CM | POA: Diagnosis not present

## 2023-09-27 DIAGNOSIS — Z992 Dependence on renal dialysis: Secondary | ICD-10-CM | POA: Diagnosis not present

## 2023-09-27 DIAGNOSIS — N2581 Secondary hyperparathyroidism of renal origin: Secondary | ICD-10-CM | POA: Diagnosis not present

## 2023-09-27 DIAGNOSIS — N186 End stage renal disease: Secondary | ICD-10-CM | POA: Diagnosis not present

## 2023-09-30 DIAGNOSIS — N186 End stage renal disease: Secondary | ICD-10-CM | POA: Diagnosis not present

## 2023-09-30 DIAGNOSIS — Z992 Dependence on renal dialysis: Secondary | ICD-10-CM | POA: Diagnosis not present

## 2023-09-30 DIAGNOSIS — N2581 Secondary hyperparathyroidism of renal origin: Secondary | ICD-10-CM | POA: Diagnosis not present

## 2023-10-02 DIAGNOSIS — Z992 Dependence on renal dialysis: Secondary | ICD-10-CM | POA: Diagnosis not present

## 2023-10-02 DIAGNOSIS — N2581 Secondary hyperparathyroidism of renal origin: Secondary | ICD-10-CM | POA: Diagnosis not present

## 2023-10-02 DIAGNOSIS — N186 End stage renal disease: Secondary | ICD-10-CM | POA: Diagnosis not present

## 2023-10-03 DIAGNOSIS — Z992 Dependence on renal dialysis: Secondary | ICD-10-CM | POA: Diagnosis not present

## 2023-10-03 DIAGNOSIS — N2581 Secondary hyperparathyroidism of renal origin: Secondary | ICD-10-CM | POA: Diagnosis not present

## 2023-10-03 DIAGNOSIS — N186 End stage renal disease: Secondary | ICD-10-CM | POA: Diagnosis not present

## 2023-10-04 DIAGNOSIS — Z992 Dependence on renal dialysis: Secondary | ICD-10-CM | POA: Diagnosis not present

## 2023-10-04 DIAGNOSIS — N2581 Secondary hyperparathyroidism of renal origin: Secondary | ICD-10-CM | POA: Diagnosis not present

## 2023-10-04 DIAGNOSIS — N186 End stage renal disease: Secondary | ICD-10-CM | POA: Diagnosis not present

## 2023-10-07 DIAGNOSIS — Z992 Dependence on renal dialysis: Secondary | ICD-10-CM | POA: Diagnosis not present

## 2023-10-07 DIAGNOSIS — N2581 Secondary hyperparathyroidism of renal origin: Secondary | ICD-10-CM | POA: Diagnosis not present

## 2023-10-07 DIAGNOSIS — N186 End stage renal disease: Secondary | ICD-10-CM | POA: Diagnosis not present

## 2023-10-09 DIAGNOSIS — N2581 Secondary hyperparathyroidism of renal origin: Secondary | ICD-10-CM | POA: Diagnosis not present

## 2023-10-09 DIAGNOSIS — Z992 Dependence on renal dialysis: Secondary | ICD-10-CM | POA: Diagnosis not present

## 2023-10-09 DIAGNOSIS — N186 End stage renal disease: Secondary | ICD-10-CM | POA: Diagnosis not present

## 2023-10-11 DIAGNOSIS — Z992 Dependence on renal dialysis: Secondary | ICD-10-CM | POA: Diagnosis not present

## 2023-10-11 DIAGNOSIS — N186 End stage renal disease: Secondary | ICD-10-CM | POA: Diagnosis not present

## 2023-10-11 DIAGNOSIS — N2581 Secondary hyperparathyroidism of renal origin: Secondary | ICD-10-CM | POA: Diagnosis not present

## 2023-10-14 DIAGNOSIS — N186 End stage renal disease: Secondary | ICD-10-CM | POA: Diagnosis not present

## 2023-10-14 DIAGNOSIS — Z992 Dependence on renal dialysis: Secondary | ICD-10-CM | POA: Diagnosis not present

## 2023-10-14 DIAGNOSIS — N2581 Secondary hyperparathyroidism of renal origin: Secondary | ICD-10-CM | POA: Diagnosis not present

## 2023-10-16 DIAGNOSIS — N2581 Secondary hyperparathyroidism of renal origin: Secondary | ICD-10-CM | POA: Diagnosis not present

## 2023-10-16 DIAGNOSIS — Z992 Dependence on renal dialysis: Secondary | ICD-10-CM | POA: Diagnosis not present

## 2023-10-16 DIAGNOSIS — N186 End stage renal disease: Secondary | ICD-10-CM | POA: Diagnosis not present

## 2023-10-18 DIAGNOSIS — N186 End stage renal disease: Secondary | ICD-10-CM | POA: Diagnosis not present

## 2023-10-18 DIAGNOSIS — N2581 Secondary hyperparathyroidism of renal origin: Secondary | ICD-10-CM | POA: Diagnosis not present

## 2023-10-18 DIAGNOSIS — Z992 Dependence on renal dialysis: Secondary | ICD-10-CM | POA: Diagnosis not present

## 2023-10-21 DIAGNOSIS — N186 End stage renal disease: Secondary | ICD-10-CM | POA: Diagnosis not present

## 2023-10-21 DIAGNOSIS — Z992 Dependence on renal dialysis: Secondary | ICD-10-CM | POA: Diagnosis not present

## 2023-10-21 DIAGNOSIS — N2581 Secondary hyperparathyroidism of renal origin: Secondary | ICD-10-CM | POA: Diagnosis not present

## 2023-10-22 DIAGNOSIS — H40021 Open angle with borderline findings, high risk, right eye: Secondary | ICD-10-CM | POA: Diagnosis not present

## 2023-10-22 DIAGNOSIS — H401123 Primary open-angle glaucoma, left eye, severe stage: Secondary | ICD-10-CM | POA: Diagnosis not present

## 2023-10-23 DIAGNOSIS — N186 End stage renal disease: Secondary | ICD-10-CM | POA: Diagnosis not present

## 2023-10-23 DIAGNOSIS — Z992 Dependence on renal dialysis: Secondary | ICD-10-CM | POA: Diagnosis not present

## 2023-10-23 DIAGNOSIS — N2581 Secondary hyperparathyroidism of renal origin: Secondary | ICD-10-CM | POA: Diagnosis not present

## 2023-10-25 DIAGNOSIS — Z992 Dependence on renal dialysis: Secondary | ICD-10-CM | POA: Diagnosis not present

## 2023-10-25 DIAGNOSIS — E1122 Type 2 diabetes mellitus with diabetic chronic kidney disease: Secondary | ICD-10-CM | POA: Diagnosis not present

## 2023-10-25 DIAGNOSIS — N2581 Secondary hyperparathyroidism of renal origin: Secondary | ICD-10-CM | POA: Diagnosis not present

## 2023-10-25 DIAGNOSIS — N186 End stage renal disease: Secondary | ICD-10-CM | POA: Diagnosis not present

## 2023-10-28 DIAGNOSIS — Z992 Dependence on renal dialysis: Secondary | ICD-10-CM | POA: Diagnosis not present

## 2023-10-28 DIAGNOSIS — N2581 Secondary hyperparathyroidism of renal origin: Secondary | ICD-10-CM | POA: Diagnosis not present

## 2023-10-28 DIAGNOSIS — N186 End stage renal disease: Secondary | ICD-10-CM | POA: Diagnosis not present

## 2023-10-30 DIAGNOSIS — Z992 Dependence on renal dialysis: Secondary | ICD-10-CM | POA: Diagnosis not present

## 2023-10-30 DIAGNOSIS — N186 End stage renal disease: Secondary | ICD-10-CM | POA: Diagnosis not present

## 2023-10-30 DIAGNOSIS — N2581 Secondary hyperparathyroidism of renal origin: Secondary | ICD-10-CM | POA: Diagnosis not present

## 2023-11-01 DIAGNOSIS — Z992 Dependence on renal dialysis: Secondary | ICD-10-CM | POA: Diagnosis not present

## 2023-11-01 DIAGNOSIS — N186 End stage renal disease: Secondary | ICD-10-CM | POA: Diagnosis not present

## 2023-11-01 DIAGNOSIS — N2581 Secondary hyperparathyroidism of renal origin: Secondary | ICD-10-CM | POA: Diagnosis not present

## 2023-11-04 DIAGNOSIS — N2581 Secondary hyperparathyroidism of renal origin: Secondary | ICD-10-CM | POA: Diagnosis not present

## 2023-11-04 DIAGNOSIS — N186 End stage renal disease: Secondary | ICD-10-CM | POA: Diagnosis not present

## 2023-11-04 DIAGNOSIS — Z992 Dependence on renal dialysis: Secondary | ICD-10-CM | POA: Diagnosis not present

## 2023-11-05 DIAGNOSIS — N186 End stage renal disease: Secondary | ICD-10-CM | POA: Diagnosis not present

## 2023-11-05 DIAGNOSIS — E782 Mixed hyperlipidemia: Secondary | ICD-10-CM | POA: Diagnosis not present

## 2023-11-05 DIAGNOSIS — I10 Essential (primary) hypertension: Secondary | ICD-10-CM | POA: Diagnosis not present

## 2023-11-05 DIAGNOSIS — I5032 Chronic diastolic (congestive) heart failure: Secondary | ICD-10-CM | POA: Diagnosis not present

## 2023-11-06 DIAGNOSIS — Z992 Dependence on renal dialysis: Secondary | ICD-10-CM | POA: Diagnosis not present

## 2023-11-06 DIAGNOSIS — N2581 Secondary hyperparathyroidism of renal origin: Secondary | ICD-10-CM | POA: Diagnosis not present

## 2023-11-06 DIAGNOSIS — N186 End stage renal disease: Secondary | ICD-10-CM | POA: Diagnosis not present

## 2023-11-08 DIAGNOSIS — N2581 Secondary hyperparathyroidism of renal origin: Secondary | ICD-10-CM | POA: Diagnosis not present

## 2023-11-08 DIAGNOSIS — N186 End stage renal disease: Secondary | ICD-10-CM | POA: Diagnosis not present

## 2023-11-08 DIAGNOSIS — Z992 Dependence on renal dialysis: Secondary | ICD-10-CM | POA: Diagnosis not present

## 2023-11-11 DIAGNOSIS — N186 End stage renal disease: Secondary | ICD-10-CM | POA: Diagnosis not present

## 2023-11-11 DIAGNOSIS — N2581 Secondary hyperparathyroidism of renal origin: Secondary | ICD-10-CM | POA: Diagnosis not present

## 2023-11-11 DIAGNOSIS — Z992 Dependence on renal dialysis: Secondary | ICD-10-CM | POA: Diagnosis not present

## 2023-11-13 DIAGNOSIS — Z992 Dependence on renal dialysis: Secondary | ICD-10-CM | POA: Diagnosis not present

## 2023-11-13 DIAGNOSIS — N2581 Secondary hyperparathyroidism of renal origin: Secondary | ICD-10-CM | POA: Diagnosis not present

## 2023-11-13 DIAGNOSIS — N186 End stage renal disease: Secondary | ICD-10-CM | POA: Diagnosis not present

## 2023-11-15 DIAGNOSIS — N186 End stage renal disease: Secondary | ICD-10-CM | POA: Diagnosis not present

## 2023-11-15 DIAGNOSIS — N2581 Secondary hyperparathyroidism of renal origin: Secondary | ICD-10-CM | POA: Diagnosis not present

## 2023-11-15 DIAGNOSIS — Z992 Dependence on renal dialysis: Secondary | ICD-10-CM | POA: Diagnosis not present

## 2023-11-18 DIAGNOSIS — Z992 Dependence on renal dialysis: Secondary | ICD-10-CM | POA: Diagnosis not present

## 2023-11-18 DIAGNOSIS — N186 End stage renal disease: Secondary | ICD-10-CM | POA: Diagnosis not present

## 2023-11-18 DIAGNOSIS — N2581 Secondary hyperparathyroidism of renal origin: Secondary | ICD-10-CM | POA: Diagnosis not present

## 2023-11-20 DIAGNOSIS — N2581 Secondary hyperparathyroidism of renal origin: Secondary | ICD-10-CM | POA: Diagnosis not present

## 2023-11-20 DIAGNOSIS — Z992 Dependence on renal dialysis: Secondary | ICD-10-CM | POA: Diagnosis not present

## 2023-11-20 DIAGNOSIS — N186 End stage renal disease: Secondary | ICD-10-CM | POA: Diagnosis not present

## 2023-11-22 DIAGNOSIS — N2581 Secondary hyperparathyroidism of renal origin: Secondary | ICD-10-CM | POA: Diagnosis not present

## 2023-11-22 DIAGNOSIS — N186 End stage renal disease: Secondary | ICD-10-CM | POA: Diagnosis not present

## 2023-11-22 DIAGNOSIS — Z992 Dependence on renal dialysis: Secondary | ICD-10-CM | POA: Diagnosis not present

## 2023-11-24 DIAGNOSIS — N186 End stage renal disease: Secondary | ICD-10-CM | POA: Diagnosis not present

## 2023-11-24 DIAGNOSIS — Z992 Dependence on renal dialysis: Secondary | ICD-10-CM | POA: Diagnosis not present

## 2023-11-24 DIAGNOSIS — E1122 Type 2 diabetes mellitus with diabetic chronic kidney disease: Secondary | ICD-10-CM | POA: Diagnosis not present

## 2023-11-25 DIAGNOSIS — N2581 Secondary hyperparathyroidism of renal origin: Secondary | ICD-10-CM | POA: Diagnosis not present

## 2023-11-25 DIAGNOSIS — Z992 Dependence on renal dialysis: Secondary | ICD-10-CM | POA: Diagnosis not present

## 2023-11-25 DIAGNOSIS — N186 End stage renal disease: Secondary | ICD-10-CM | POA: Diagnosis not present

## 2023-11-27 DIAGNOSIS — Z992 Dependence on renal dialysis: Secondary | ICD-10-CM | POA: Diagnosis not present

## 2023-11-27 DIAGNOSIS — N186 End stage renal disease: Secondary | ICD-10-CM | POA: Diagnosis not present

## 2023-11-27 DIAGNOSIS — N2581 Secondary hyperparathyroidism of renal origin: Secondary | ICD-10-CM | POA: Diagnosis not present

## 2023-11-29 ENCOUNTER — Other Ambulatory Visit: Payer: Self-pay | Admitting: Family Medicine

## 2023-11-29 DIAGNOSIS — N186 End stage renal disease: Secondary | ICD-10-CM | POA: Diagnosis not present

## 2023-11-29 DIAGNOSIS — N2581 Secondary hyperparathyroidism of renal origin: Secondary | ICD-10-CM | POA: Diagnosis not present

## 2023-11-29 DIAGNOSIS — I1311 Hypertensive heart and chronic kidney disease without heart failure, with stage 5 chronic kidney disease, or end stage renal disease: Secondary | ICD-10-CM

## 2023-11-29 DIAGNOSIS — Z992 Dependence on renal dialysis: Secondary | ICD-10-CM | POA: Diagnosis not present

## 2023-12-02 ENCOUNTER — Telehealth: Payer: Self-pay | Admitting: Family Medicine

## 2023-12-02 DIAGNOSIS — N2581 Secondary hyperparathyroidism of renal origin: Secondary | ICD-10-CM | POA: Diagnosis not present

## 2023-12-02 DIAGNOSIS — Z992 Dependence on renal dialysis: Secondary | ICD-10-CM | POA: Diagnosis not present

## 2023-12-02 DIAGNOSIS — N186 End stage renal disease: Secondary | ICD-10-CM | POA: Diagnosis not present

## 2023-12-02 NOTE — Telephone Encounter (Signed)
 Called pt to confirm appt. Pt will be present.

## 2023-12-03 ENCOUNTER — Encounter: Payer: Self-pay | Admitting: Family Medicine

## 2023-12-03 ENCOUNTER — Ambulatory Visit: Payer: Medicare HMO | Attending: Family Medicine | Admitting: Family Medicine

## 2023-12-03 VITALS — BP 128/76 | HR 85 | Wt 312.6 lb

## 2023-12-03 DIAGNOSIS — Z992 Dependence on renal dialysis: Secondary | ICD-10-CM

## 2023-12-03 DIAGNOSIS — Z6841 Body Mass Index (BMI) 40.0 and over, adult: Secondary | ICD-10-CM | POA: Diagnosis not present

## 2023-12-03 DIAGNOSIS — E669 Obesity, unspecified: Secondary | ICD-10-CM | POA: Diagnosis not present

## 2023-12-03 DIAGNOSIS — Z7985 Long-term (current) use of injectable non-insulin antidiabetic drugs: Secondary | ICD-10-CM | POA: Diagnosis not present

## 2023-12-03 DIAGNOSIS — N186 End stage renal disease: Secondary | ICD-10-CM

## 2023-12-03 DIAGNOSIS — E119 Type 2 diabetes mellitus without complications: Secondary | ICD-10-CM | POA: Diagnosis not present

## 2023-12-03 DIAGNOSIS — E1122 Type 2 diabetes mellitus with diabetic chronic kidney disease: Secondary | ICD-10-CM | POA: Diagnosis not present

## 2023-12-03 DIAGNOSIS — I1311 Hypertensive heart and chronic kidney disease without heart failure, with stage 5 chronic kidney disease, or end stage renal disease: Secondary | ICD-10-CM | POA: Diagnosis not present

## 2023-12-03 DIAGNOSIS — E1169 Type 2 diabetes mellitus with other specified complication: Secondary | ICD-10-CM

## 2023-12-03 LAB — POCT GLYCOSYLATED HEMOGLOBIN (HGB A1C): HbA1c, POC (controlled diabetic range): 5.9 % (ref 0.0–7.0)

## 2023-12-03 LAB — GLUCOSE, POCT (MANUAL RESULT ENTRY): POC Glucose: 84 mg/dL (ref 70–99)

## 2023-12-03 MED ORDER — OZEMPIC (1 MG/DOSE) 4 MG/3ML ~~LOC~~ SOPN: 1.0000 mg | PEN_INJECTOR | SUBCUTANEOUS | 6 refills | Status: DC

## 2023-12-03 MED ORDER — AMLODIPINE BESYLATE 5 MG PO TABS: 5.0000 mg | ORAL_TABLET | Freq: Every day | ORAL | 1 refills | Status: DC

## 2023-12-03 MED ORDER — ATORVASTATIN CALCIUM 40 MG PO TABS: ORAL_TABLET | ORAL | 1 refills | Status: DC

## 2023-12-03 NOTE — Progress Notes (Signed)
 Subjective:  Patient ID: Adam Dennis, male    DOB: Feb 16, 1961  Age: 63 y.o. MRN: [id number]  CC: Medical Management of Chronic Issues     Discussed the use of AI scribe software for clinical note transcription with the patient, who gave verbal consent to proceed.  History of Present Illness Adam Dennis is a 63 year old male with a history of type 2 diabetes (A1c 6.0), hypertension, ESRD on HD, obstructive sleep apnea (on BiPAP at night), combined systolic and diastolic CHF (EF 49-44% from echo of 10/2021),emphysema, chronic respiratory failure ( on 4L oxygen )  who presents for routine follow-up.  He undergoes dialysis three times a week and occasionally experiences low blood pressure during sessions without significant dizziness or lightheadedness. He takes amlodipine  5 mg daily for hypertension and does not experience hypotension outside of dialysis.  He is on atorvastatin  for hyperlipidemia, with a cholesterol check scheduled for next Wednesday. He continues to take atorvastatin  as prescribed.  For diabetes, he is on Ozempic  1 mg, with a stable hemoglobin A1c of 5.9% since January.  He uses supplemental oxygen  at 4 liters per minute when visiting the clinic but not at home. His primary exercise is 'walking from the elevator to the clinic', and he acknowledges the need to increase physical activity. No pain, constipation, knee problems, or leg swelling are present.    Past Medical History:  Diagnosis Date   Arthritis    back (02/06/2017)   CHF (congestive heart failure) (HCC)    new onset /Encounter Date 09/12/2006   Chronic combined systolic and diastolic heart failure (HCC)    CKD (chronic kidney disease) stage 4, GFR 15-29 ml/min (HCC)    COPD (chronic obstructive pulmonary disease) (HCC)    End stage renal disease on dialysis [facility])    Tues/ Thur/ Sat [address]   Glaucoma, right eye    High cholesterol    Hypertension    Hypertrophy of tonsils alone     Myocardial infarction (HCC)    small one; ~ 2008 (02/06/2017)   Obesity, unspecified    On home oxygen  therapy    4L; 24/7 (04/10/2017)   OSA on CPAP    Type II diabetes mellitus (HCC)     Past Surgical History:  Procedure Laterality Date   A/V FISTULAGRAM Right 04/30/2023   Procedure: A/V Fistulagram;  Surgeon: Lanis Fonda FORBES, MD;  Location: [facility] INVASIVE CV LAB;  Service: Cardiovascular;  Laterality: Right;   AV FISTULA PLACEMENT Right 07/01/2019   Procedure: Right arm arteriovenous fistual;  Surgeon: Sheree Penne Bruckner, MD;  Location: [facility] OR;  Service: Vascular;  Laterality: Right;   FISTULA SUPERFICIALIZATION Right 09/01/2019   Procedure: RIGHT UPPER ARM FISTULA  TRANSPOSITION OF RIGHT UPPPER ARM BRACHIAL CEPHALIC FISTULA;  Surgeon: Sheree Penne Bruckner, MD;  Location: [facility] OR;  Service: Vascular;  Laterality: Right;   HERNIA REPAIR     INSERTION OF DIALYSIS CATHETER Right 07/01/2019   Procedure: INSERTION OF TUNNELED DIALYSIS CATHETER;  Surgeon: Sheree Penne Bruckner, MD;  Location: [facility] OR;  Service: Vascular;  Laterality: Right;   PERIPHERAL VASCULAR BALLOON ANGIOPLASTY Right 04/30/2023   Procedure: PERIPHERAL VASCULAR BALLOON ANGIOPLASTY;  Surgeon: Lanis Fonda FORBES, MD;  Location: [facility] INVASIVE CV LAB;  Service: Cardiovascular;  Laterality: Right;  AVF   TEE WITHOUT CARDIOVERSION N/A 04/16/2017   Procedure: TRANSESOPHAGEAL ECHOCARDIOGRAM (TEE);  Surgeon: Mona Vinie BROCKS, MD;  Location: [facility] ENDOSCOPY;  Service: Cardiovascular;  Laterality: N/A;   UMBILICAL HERNIA REPAIR  1960s  when I was little baby    Family History  Problem Relation Age of Onset   Hypertension Mother    Diabetes Sister    Colon cancer Sister    Diabetes Brother    Cancer Maternal Aunt    Amblyopia Neg Hx    Blindness Neg Hx    Cataracts Neg Hx    Glaucoma Neg Hx    Macular degeneration Neg Hx    Strabismus Neg Hx    Retinal detachment Neg Hx    Retinitis pigmentosa Neg Hx     Social  History   Socioeconomic History   Marital status: Married    Spouse name: Not on file   Number of children: 0   Years of education: Not on file   Highest education level: Not on file  Occupational History   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  Tobacco Use   Smoking status: Never   Smokeless tobacco: Never  Vaping Use   Vaping status: Never Used  Substance and Sexual Activity   Alcohol use: Yes    Comment: occasional   Drug use: No   Sexual activity: Not Currently  Other Topics Concern   Not on file  Social History Narrative   Not on file   Social Drivers of Health   Financial Resource Strain: Low Risk  (06/04/2023)   Overall Financial Resource Strain (CARDIA)    Difficulty of Paying Living Expenses: Not very hard  Food Insecurity: No Food Insecurity (06/04/2023)   Hunger Vital Sign    Worried About Running Out of Food in the Last Year: Never true    Ran Out of Food in the Last Year: Never true  Transportation Needs: No Transportation Needs (06/04/2023)   PRAPARE - Administrator, Civil Service (Medical): No    Lack of Transportation (Non-Medical): No  Physical Activity: Insufficiently Active (06/04/2023)   Exercise Vital Sign    Days of Exercise per Week: 3 days    Minutes of Exercise per Session: 20 min  Stress: No Stress Concern Present (06/04/2023)   [employer] of Occupational Health - Occupational Stress Questionnaire    Feeling of Stress : Not at all  Social Connections: Socially Isolated (06/04/2023)   Social Connection and Isolation Panel    Frequency of Communication with Friends and Family: Once a week    Frequency of Social Gatherings with Friends and Family: Once a week    Attends Religious Services: Never    Database administrator or Organizations: No    Attends Engineer, structural: Never    Marital Status: Living with partner    No Known Allergies  Outpatient Medications Prior to Visit  Medication Sig Dispense Refill   ACCU-CHEK SOFTCLIX  LANCETS lancets Use as instructed to check blood sugar up to 3 times daily. 100 each 11   aspirin  EC 81 MG tablet Take 81 mg by mouth daily.     Blood Glucose Monitoring Suppl (ACCU-CHEK AVIVA) device Use as instructed to check blood sugar up to 3 times daily. 1 each 0   dorzolamide -timolol  (COSOPT ) 22.3-6.8 MG/ML ophthalmic solution Place 1 drop into both eyes 2 (two) times daily.     FEROSUL 325 (65 Fe) MG tablet TAKE 1 TABLET(325 MG) BY MOUTH DAILY WITH BREAKFAST (Patient taking differently: Take 325 mg by mouth Every Tuesday,Thursday,and Saturday with dialysis.) 100 tablet 3   glucose blood (ACCU-CHEK AVIVA PLUS) test strip Use as instructed to check blood sugar up to 3 times  daily. 100 each 11   Lancet Devices (ACCU-CHEK SOFTCLIX) lancets Use as instructed daily. 1 each 5   methocarbamol  (ROBAXIN ) 500 MG tablet Take 1 tablet (500 mg total) by mouth every 8 (eight) hours as needed for muscle spasms. 180 tablet 1   nitroGLYCERIN  (NITROSTAT ) 0.4 MG SL tablet PLACE 1 TABLET BY MOUTH UNDER TONGUE EVERY 5 MINUTES FOR 3 DOSES AS NEEDED FOR CHEST PAIN (Patient taking differently: Place 0.4 mg under the tongue every 5 (five) minutes as needed for chest pain.) 25 tablet 0   sevelamer  carbonate (RENVELA ) 800 MG tablet Take 1,600-2,400 mg by mouth See admin instructions. 2400 mg with meals, and 1600 mg with snacks     TRAVATAN Z 0.004 % SOLN ophthalmic solution Place 1 drop into both eyes at bedtime.  12   amLODipine  (NORVASC ) 5 MG tablet Take 1 tablet (5 mg total) by mouth daily. 90 tablet 1   atorvastatin  (LIPITOR) 40 MG tablet TAKE 1 TABLET(40 MG) BY MOUTH DAILY 90 tablet 1   Semaglutide , 1 MG/DOSE, (OZEMPIC , 1 MG/DOSE,) 4 MG/3ML SOPN Inject 1 mg into the skin once a week. 3 mL 6   Facility-Administered Medications Prior to Visit  Medication Dose Route Frequency Provider Last Rate Last Admin   sodium chloride  flush (NS) 0.9 % injection 3 mL  3 mL Intravenous Q12H Robins, Joshua E, MD       sodium  chloride flush (NS) 0.9 % injection 3 mL  3 mL Intravenous PRN Robins, Joshua E, MD         ROS Review of Systems  Constitutional:  Negative for activity change and appetite change.  HENT:  Negative for sinus pressure and sore throat.   Respiratory:  Negative for chest tightness, shortness of breath and wheezing.   Cardiovascular:  Negative for chest pain and palpitations.  Gastrointestinal:  Negative for abdominal distention, abdominal pain and constipation.  Genitourinary: Negative.   Musculoskeletal: Negative.   Psychiatric/Behavioral:  Negative for behavioral problems and dysphoric mood.     Objective:  BP 128/76 (BP Location: Left Arm, Patient Position: Sitting, Cuff Size: Large)   Pulse 85   Wt (!) 312 lb 9.6 oz (141.8 kg)   SpO2 93%   BMI 42.40 kg/m      12/03/2023    1:34 PM 07/23/2023    9:05 AM 06/04/2023    1:56 PM  BP/Weight  Systolic BP 128 142 129  Diastolic BP 76 84 80  Wt. (Lbs) 312.6 314.2 315.2  BMI 42.4 kg/m2 42.61 kg/m2 42.75 kg/m2      Physical Exam Constitutional:      Appearance: He is well-developed.  HENT:     Nose:     Comments: On 4L oxygen  via nasal canula Cardiovascular:     Rate and Rhythm: Normal rate.     Heart sounds: Normal heart sounds. No murmur heard. Pulmonary:     Effort: Pulmonary effort is normal.     Breath sounds: Normal breath sounds. No wheezing or rales.  Chest:     Chest wall: No tenderness.  Abdominal:     General: Bowel sounds are normal. There is no distension.     Palpations: Abdomen is soft. There is no mass.     Tenderness: There is no abdominal tenderness.  Musculoskeletal:        General: Normal range of motion.     Right lower leg: No edema.     Left lower leg: No edema.  Neurological:  Mental Status: He is alert and oriented to person, place, and time.  Psychiatric:        Mood and Affect: Mood normal.        Latest Ref Rng & Units 04/30/2023    8:24 AM 01/26/2022    8:02 AM 01/25/2022    6:27 AM   CMP  Glucose 70 - 99 mg/dL 92  889  96   BUN 8 - 23 mg/dL 31  48  28   Creatinine 0.61 - 1.24 mg/dL 1.79  0.00  2.45   Sodium 135 - 145 mmol/L 141  137  135   Potassium 3.5 - 5.1 mmol/L 4.8  4.5  4.5   Chloride 98 - 111 mmol/L 100  98  94   CO2 22 - 32 mmol/L  29  31   Calcium  8.9 - 10.3 mg/dL  9.4  9.5     Lipid Panel     Component Value Date/Time   CHOL 136 11/11/2021 0057   CHOL 146 08/20/2021 1056   TRIG 104 11/11/2021 0057   HDL 66 11/11/2021 0057   HDL 54 08/20/2021 1056   CHOLHDL 2.1 11/11/2021 0057   VLDL 21 11/11/2021 0057   LDLCALC 49 11/11/2021 0057   LDLCALC 72 08/20/2021 1056    CBC    Component Value Date/Time   WBC 5.5 01/26/2022 0802   RBC 4.28 01/26/2022 0802   HGB 14.3 04/30/2023 0824   HGB 12.8 (L) 03/24/2019 1017   HCT 42.0 04/30/2023 0824   HCT 39.7 03/24/2019 1017   PLT 257 01/26/2022 0802   PLT 191 03/24/2019 1017   MCV 88.8 01/26/2022 0802   MCV 87 03/24/2019 1017   MCH 26.9 01/26/2022 0802   MCHC 30.3 01/26/2022 0802   RDW 17.5 (H) 01/26/2022 0802   RDW 13.9 03/24/2019 1017   LYMPHSABS 1.0 01/21/2022 0845   LYMPHSABS 1.3 03/24/2019 1017   MONOABS 0.6 01/21/2022 0845   EOSABS 0.1 01/21/2022 0845   EOSABS 0.0 03/24/2019 1017   BASOSABS 0.0 01/21/2022 0845   BASOSABS 0.0 03/24/2019 1017    Lab Results  Component Value Date   HGBA1C 5.9 12/03/2023   Lab Results  Component Value Date   HGBA1C 5.9 12/03/2023   HGBA1C 5.9 06/04/2023   HGBA1C 6.0 08/28/2022       1. Type 2 diabetes mellitus with obesity (HCC) (Primary) Controlled with A1c of 5.9 Continue Ozempic  Counseled on Diabetic diet, the healthy plate, 849 minutes of moderate intensity exercise/week Blood sugar logs with fasting goals of 80-120 mg/dl, random of less than 819 and in the event of sugars less than 60 mg/dl or greater than 599 mg/dl encouraged to notify the clinic. Advised on the need for annual eye exams, annual foot exams, Pneumonia vaccine. - POCT  glycosylated hemoglobin (Hb A1C) - POCT glucose (manual entry) - LP+Non-HDL Cholesterol; Future - atorvastatin  (LIPITOR) 40 MG tablet; TAKE 1 TABLET(40 MG) BY MOUTH DAILY  Dispense: 90 tablet; Refill: 1  2. 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 Discussed medication compliance, adverse effects. - amLODipine  (NORVASC ) 5 MG tablet; Take 1 tablet (5 mg total) by mouth daily.  Dispense: 90 tablet; Refill: 1  3. Type 2 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, without long-term current use of insulin  (HCC) See #1 above Continue hemodialysis per schedule -  Semaglutide , 1 MG/DOSE, (OZEMPIC , 1 MG/DOSE,) 4 MG/3ML SOPN; Inject 1 mg into the skin once a week.  Dispense: 3 mL; Refill: 6  4. Long-term current use of injectable noninsulin antidiabetic medication - Semaglutide , 1 MG/DOSE, (OZEMPIC , 1 MG/DOSE,) 4 MG/3ML SOPN; Inject 1 mg into the skin once a week.  Dispense: 3 mL; Refill: 6   Meds ordered this encounter  Medications   amLODipine  (NORVASC ) 5 MG tablet    Sig: Take 1 tablet (5 mg total) by mouth daily.    Dispense:  90 tablet    Refill:  1   atorvastatin  (LIPITOR) 40 MG tablet    Sig: TAKE 1 TABLET(40 MG) BY MOUTH DAILY    Dispense:  90 tablet    Refill:  1   Semaglutide , 1 MG/DOSE, (OZEMPIC , 1 MG/DOSE,) 4 MG/3ML SOPN    Sig: Inject 1 mg into the skin once a week.    Dispense:  3 mL    Refill:  6    Follow-up: Return in about 6 months (around 06/04/2024).       Corrina Sabin, MD, FAAFP. [facility] [place], [place] [phone]   12/03/2023, 5:45 PM

## 2023-12-03 NOTE — Patient Instructions (Signed)
 VISIT SUMMARY:  You came in today for a routine follow-up. We discussed your ongoing treatments for hypertension, hyperlipidemia, diabetes, and end-stage renal disease. Your blood pressure, cholesterol, and blood sugar levels are well-controlled with your current medications. We also talked about the importance of increasing your physical activity.  YOUR PLAN:  -END STAGE RENAL DISEASE ON DIALYSIS: End-stage renal disease is the final stage of chronic kidney disease where the kidneys no longer function properly. You are undergoing dialysis three times a week and your blood pressure is stable during these sessions. Continue with your dialysis schedule on Tuesday, Thursday, and Saturday.  -HYPERTENSION: Hypertension is high blood pressure. Your blood pressure is well-controlled with amlodipine  5 mg daily. We will continue this dose and monitor your blood pressure during dialysis. If you experience low blood pressure during dialysis, we may consider reducing the dose to 2.5 mg daily.  -TYPE II DIABETES MELLITUS: Type II diabetes is a condition where the body does not use insulin  properly, leading to high blood sugar levels. Your diabetes is well-controlled with semaglutide  1 mg weekly, and your hemoglobin A1c is 5.9%. Continue with your current dose and monitor your blood sugar levels regularly.  -HYPERLIPIDEMIA: Hyperlipidemia is having high levels of fats (lipids) in the blood, such as cholesterol. You are taking atorvastatin  40 mg daily to manage this. Continue with your current dose and remember to come in for your fasting cholesterol test next Wednesday at 8:30 AM.  -GENERAL HEALTH MAINTENANCE: For overall health, it's important to stay active. You recently had an eye exam, which is good. Try to increase your physical activity, such as walking in the morning before 10 AM.  INSTRUCTIONS:  Please schedule a follow-up appointment in six months. If any issues arise before then, do not hesitate to  come in sooner. Also, remember to come in for your fasting cholesterol test next Wednesday at 8:30 AM.

## 2023-12-04 DIAGNOSIS — N186 End stage renal disease: Secondary | ICD-10-CM | POA: Diagnosis not present

## 2023-12-04 DIAGNOSIS — N2581 Secondary hyperparathyroidism of renal origin: Secondary | ICD-10-CM | POA: Diagnosis not present

## 2023-12-04 DIAGNOSIS — Z992 Dependence on renal dialysis: Secondary | ICD-10-CM | POA: Diagnosis not present

## 2023-12-06 DIAGNOSIS — N186 End stage renal disease: Secondary | ICD-10-CM | POA: Diagnosis not present

## 2023-12-06 DIAGNOSIS — Z992 Dependence on renal dialysis: Secondary | ICD-10-CM | POA: Diagnosis not present

## 2023-12-06 DIAGNOSIS — N2581 Secondary hyperparathyroidism of renal origin: Secondary | ICD-10-CM | POA: Diagnosis not present

## 2023-12-09 DIAGNOSIS — Z992 Dependence on renal dialysis: Secondary | ICD-10-CM | POA: Diagnosis not present

## 2023-12-09 DIAGNOSIS — N186 End stage renal disease: Secondary | ICD-10-CM | POA: Diagnosis not present

## 2023-12-09 DIAGNOSIS — N2581 Secondary hyperparathyroidism of renal origin: Secondary | ICD-10-CM | POA: Diagnosis not present

## 2023-12-10 ENCOUNTER — Ambulatory Visit: Attending: Family Medicine

## 2023-12-10 DIAGNOSIS — E669 Obesity, unspecified: Secondary | ICD-10-CM | POA: Diagnosis not present

## 2023-12-10 DIAGNOSIS — E1169 Type 2 diabetes mellitus with other specified complication: Secondary | ICD-10-CM | POA: Diagnosis not present

## 2023-12-11 ENCOUNTER — Ambulatory Visit: Payer: Self-pay | Admitting: Family Medicine

## 2023-12-11 DIAGNOSIS — Z992 Dependence on renal dialysis: Secondary | ICD-10-CM | POA: Diagnosis not present

## 2023-12-11 DIAGNOSIS — N186 End stage renal disease: Secondary | ICD-10-CM | POA: Diagnosis not present

## 2023-12-11 DIAGNOSIS — N2581 Secondary hyperparathyroidism of renal origin: Secondary | ICD-10-CM | POA: Diagnosis not present

## 2023-12-11 LAB — LP+NON-HDL CHOLESTEROL
Cholesterol, Total: 103 mg/dL (ref 100–199)
HDL: 52 mg/dL (ref >39)
LDL Chol Calc (NIH): 35 mg/dL (ref 0–99)
Total Non-HDL-Chol (LDL+VLDL): 51 mg/dL (ref 0–129)
Triglycerides: 82 mg/dL (ref 0–149)
VLDL Cholesterol Cal: 16 mg/dL (ref 5–40)

## 2023-12-13 DIAGNOSIS — N2581 Secondary hyperparathyroidism of renal origin: Secondary | ICD-10-CM | POA: Diagnosis not present

## 2023-12-13 DIAGNOSIS — N186 End stage renal disease: Secondary | ICD-10-CM | POA: Diagnosis not present

## 2023-12-13 DIAGNOSIS — Z992 Dependence on renal dialysis: Secondary | ICD-10-CM | POA: Diagnosis not present

## 2023-12-16 DIAGNOSIS — Z992 Dependence on renal dialysis: Secondary | ICD-10-CM | POA: Diagnosis not present

## 2023-12-16 DIAGNOSIS — N2581 Secondary hyperparathyroidism of renal origin: Secondary | ICD-10-CM | POA: Diagnosis not present

## 2023-12-16 DIAGNOSIS — N186 End stage renal disease: Secondary | ICD-10-CM | POA: Diagnosis not present

## 2023-12-18 DIAGNOSIS — N2581 Secondary hyperparathyroidism of renal origin: Secondary | ICD-10-CM | POA: Diagnosis not present

## 2023-12-18 DIAGNOSIS — N186 End stage renal disease: Secondary | ICD-10-CM | POA: Diagnosis not present

## 2023-12-18 DIAGNOSIS — Z992 Dependence on renal dialysis: Secondary | ICD-10-CM | POA: Diagnosis not present

## 2023-12-20 DIAGNOSIS — N186 End stage renal disease: Secondary | ICD-10-CM | POA: Diagnosis not present

## 2023-12-20 DIAGNOSIS — N2581 Secondary hyperparathyroidism of renal origin: Secondary | ICD-10-CM | POA: Diagnosis not present

## 2023-12-20 DIAGNOSIS — Z992 Dependence on renal dialysis: Secondary | ICD-10-CM | POA: Diagnosis not present

## 2023-12-23 DIAGNOSIS — Z992 Dependence on renal dialysis: Secondary | ICD-10-CM | POA: Diagnosis not present

## 2023-12-23 DIAGNOSIS — N186 End stage renal disease: Secondary | ICD-10-CM | POA: Diagnosis not present

## 2023-12-23 DIAGNOSIS — N2581 Secondary hyperparathyroidism of renal origin: Secondary | ICD-10-CM | POA: Diagnosis not present

## 2023-12-25 DIAGNOSIS — N2581 Secondary hyperparathyroidism of renal origin: Secondary | ICD-10-CM | POA: Diagnosis not present

## 2023-12-25 DIAGNOSIS — E1122 Type 2 diabetes mellitus with diabetic chronic kidney disease: Secondary | ICD-10-CM | POA: Diagnosis not present

## 2023-12-25 DIAGNOSIS — Z992 Dependence on renal dialysis: Secondary | ICD-10-CM | POA: Diagnosis not present

## 2023-12-25 DIAGNOSIS — N186 End stage renal disease: Secondary | ICD-10-CM | POA: Diagnosis not present

## 2023-12-27 DIAGNOSIS — N186 End stage renal disease: Secondary | ICD-10-CM | POA: Diagnosis not present

## 2023-12-27 DIAGNOSIS — Z992 Dependence on renal dialysis: Secondary | ICD-10-CM | POA: Diagnosis not present

## 2023-12-27 DIAGNOSIS — N2581 Secondary hyperparathyroidism of renal origin: Secondary | ICD-10-CM | POA: Diagnosis not present

## 2023-12-30 DIAGNOSIS — N2581 Secondary hyperparathyroidism of renal origin: Secondary | ICD-10-CM | POA: Diagnosis not present

## 2023-12-30 DIAGNOSIS — Z992 Dependence on renal dialysis: Secondary | ICD-10-CM | POA: Diagnosis not present

## 2023-12-30 DIAGNOSIS — N186 End stage renal disease: Secondary | ICD-10-CM | POA: Diagnosis not present

## 2024-01-01 DIAGNOSIS — N186 End stage renal disease: Secondary | ICD-10-CM | POA: Diagnosis not present

## 2024-01-01 DIAGNOSIS — Z992 Dependence on renal dialysis: Secondary | ICD-10-CM | POA: Diagnosis not present

## 2024-01-01 DIAGNOSIS — N2581 Secondary hyperparathyroidism of renal origin: Secondary | ICD-10-CM | POA: Diagnosis not present

## 2024-01-03 DIAGNOSIS — Z992 Dependence on renal dialysis: Secondary | ICD-10-CM | POA: Diagnosis not present

## 2024-01-03 DIAGNOSIS — N2581 Secondary hyperparathyroidism of renal origin: Secondary | ICD-10-CM | POA: Diagnosis not present

## 2024-01-03 DIAGNOSIS — N186 End stage renal disease: Secondary | ICD-10-CM | POA: Diagnosis not present

## 2024-01-06 DIAGNOSIS — N186 End stage renal disease: Secondary | ICD-10-CM | POA: Diagnosis not present

## 2024-01-06 DIAGNOSIS — N2581 Secondary hyperparathyroidism of renal origin: Secondary | ICD-10-CM | POA: Diagnosis not present

## 2024-01-06 DIAGNOSIS — Z992 Dependence on renal dialysis: Secondary | ICD-10-CM | POA: Diagnosis not present

## 2024-01-08 DIAGNOSIS — N186 End stage renal disease: Secondary | ICD-10-CM | POA: Diagnosis not present

## 2024-01-08 DIAGNOSIS — N2581 Secondary hyperparathyroidism of renal origin: Secondary | ICD-10-CM | POA: Diagnosis not present

## 2024-01-08 DIAGNOSIS — Z992 Dependence on renal dialysis: Secondary | ICD-10-CM | POA: Diagnosis not present

## 2024-01-10 DIAGNOSIS — N2581 Secondary hyperparathyroidism of renal origin: Secondary | ICD-10-CM | POA: Diagnosis not present

## 2024-01-10 DIAGNOSIS — N186 End stage renal disease: Secondary | ICD-10-CM | POA: Diagnosis not present

## 2024-01-10 DIAGNOSIS — Z992 Dependence on renal dialysis: Secondary | ICD-10-CM | POA: Diagnosis not present

## 2024-01-12 ENCOUNTER — Telehealth: Payer: Self-pay

## 2024-01-12 NOTE — Telephone Encounter (Signed)
 Copied from CRM #[id number]. Topic: General - Other >> Jan 12, 2024 10:49 AM Larissa RAMAN wrote: Reason for CRM: Romero with [employer] states that a fax for a ventilator renewal was sent on 07/30 and 08/07. She states the prescription expires on 08/30 and  needs to have the document completed and returned. States she will refax the document today, 01/12/24.

## 2024-01-12 NOTE — Telephone Encounter (Signed)
 Form has been faxed to Pulmonologist to be completed.

## 2024-01-13 DIAGNOSIS — N186 End stage renal disease: Secondary | ICD-10-CM | POA: Diagnosis not present

## 2024-01-13 DIAGNOSIS — Z992 Dependence on renal dialysis: Secondary | ICD-10-CM | POA: Diagnosis not present

## 2024-01-13 DIAGNOSIS — N2581 Secondary hyperparathyroidism of renal origin: Secondary | ICD-10-CM | POA: Diagnosis not present

## 2024-01-15 DIAGNOSIS — N186 End stage renal disease: Secondary | ICD-10-CM | POA: Diagnosis not present

## 2024-01-15 DIAGNOSIS — Z992 Dependence on renal dialysis: Secondary | ICD-10-CM | POA: Diagnosis not present

## 2024-01-15 DIAGNOSIS — N2581 Secondary hyperparathyroidism of renal origin: Secondary | ICD-10-CM | POA: Diagnosis not present

## 2024-01-17 DIAGNOSIS — N186 End stage renal disease: Secondary | ICD-10-CM | POA: Diagnosis not present

## 2024-01-17 DIAGNOSIS — N2581 Secondary hyperparathyroidism of renal origin: Secondary | ICD-10-CM | POA: Diagnosis not present

## 2024-01-17 DIAGNOSIS — Z992 Dependence on renal dialysis: Secondary | ICD-10-CM | POA: Diagnosis not present

## 2024-01-20 DIAGNOSIS — N186 End stage renal disease: Secondary | ICD-10-CM | POA: Diagnosis not present

## 2024-01-20 DIAGNOSIS — Z992 Dependence on renal dialysis: Secondary | ICD-10-CM | POA: Diagnosis not present

## 2024-01-20 DIAGNOSIS — N2581 Secondary hyperparathyroidism of renal origin: Secondary | ICD-10-CM | POA: Diagnosis not present

## 2024-01-21 ENCOUNTER — Telehealth: Payer: Self-pay

## 2024-01-21 NOTE — Telephone Encounter (Signed)
 CMN for NIV received and placed in Dr. Leatha folder.

## 2024-01-22 DIAGNOSIS — N2581 Secondary hyperparathyroidism of renal origin: Secondary | ICD-10-CM | POA: Diagnosis not present

## 2024-01-22 DIAGNOSIS — N186 End stage renal disease: Secondary | ICD-10-CM | POA: Diagnosis not present

## 2024-01-22 DIAGNOSIS — Z992 Dependence on renal dialysis: Secondary | ICD-10-CM | POA: Diagnosis not present

## 2024-01-24 DIAGNOSIS — Z992 Dependence on renal dialysis: Secondary | ICD-10-CM | POA: Diagnosis not present

## 2024-01-24 DIAGNOSIS — N2581 Secondary hyperparathyroidism of renal origin: Secondary | ICD-10-CM | POA: Diagnosis not present

## 2024-01-24 DIAGNOSIS — N186 End stage renal disease: Secondary | ICD-10-CM | POA: Diagnosis not present

## 2024-01-25 DIAGNOSIS — E1122 Type 2 diabetes mellitus with diabetic chronic kidney disease: Secondary | ICD-10-CM | POA: Diagnosis not present

## 2024-01-25 DIAGNOSIS — Z992 Dependence on renal dialysis: Secondary | ICD-10-CM | POA: Diagnosis not present

## 2024-01-25 DIAGNOSIS — N186 End stage renal disease: Secondary | ICD-10-CM | POA: Diagnosis not present

## 2024-01-26 DIAGNOSIS — Z992 Dependence on renal dialysis: Secondary | ICD-10-CM | POA: Diagnosis not present

## 2024-01-26 DIAGNOSIS — N2581 Secondary hyperparathyroidism of renal origin: Secondary | ICD-10-CM | POA: Diagnosis not present

## 2024-01-26 DIAGNOSIS — N186 End stage renal disease: Secondary | ICD-10-CM | POA: Diagnosis not present

## 2024-01-27 DIAGNOSIS — Z992 Dependence on renal dialysis: Secondary | ICD-10-CM | POA: Diagnosis not present

## 2024-01-27 DIAGNOSIS — N2581 Secondary hyperparathyroidism of renal origin: Secondary | ICD-10-CM | POA: Diagnosis not present

## 2024-01-27 DIAGNOSIS — N186 End stage renal disease: Secondary | ICD-10-CM | POA: Diagnosis not present

## 2024-01-29 DIAGNOSIS — N186 End stage renal disease: Secondary | ICD-10-CM | POA: Diagnosis not present

## 2024-01-29 DIAGNOSIS — N2581 Secondary hyperparathyroidism of renal origin: Secondary | ICD-10-CM | POA: Diagnosis not present

## 2024-01-29 DIAGNOSIS — Z992 Dependence on renal dialysis: Secondary | ICD-10-CM | POA: Diagnosis not present

## 2024-01-31 DIAGNOSIS — N186 End stage renal disease: Secondary | ICD-10-CM | POA: Diagnosis not present

## 2024-01-31 DIAGNOSIS — N2581 Secondary hyperparathyroidism of renal origin: Secondary | ICD-10-CM | POA: Diagnosis not present

## 2024-01-31 DIAGNOSIS — Z992 Dependence on renal dialysis: Secondary | ICD-10-CM | POA: Diagnosis not present

## 2024-02-02 NOTE — Telephone Encounter (Signed)
 CMN has ben faxed. Received successful fax confirmation.

## 2024-02-02 NOTE — Telephone Encounter (Signed)
 Montia, do you know if this was signed?

## 2024-02-03 DIAGNOSIS — N186 End stage renal disease: Secondary | ICD-10-CM | POA: Diagnosis not present

## 2024-02-03 DIAGNOSIS — Z992 Dependence on renal dialysis: Secondary | ICD-10-CM | POA: Diagnosis not present

## 2024-02-03 DIAGNOSIS — N2581 Secondary hyperparathyroidism of renal origin: Secondary | ICD-10-CM | POA: Diagnosis not present

## 2024-02-04 ENCOUNTER — Encounter: Payer: Self-pay | Admitting: Vascular Surgery

## 2024-02-04 ENCOUNTER — Ambulatory Visit: Attending: Vascular Surgery | Admitting: Vascular Surgery

## 2024-02-04 VITALS — BP 116/77 | HR 82 | Temp 97.8°F | Ht 72.0 in | Wt 314.0 lb

## 2024-02-04 DIAGNOSIS — N186 End stage renal disease: Secondary | ICD-10-CM

## 2024-02-04 NOTE — Progress Notes (Signed)
 Patient ID: Adam Dennis, male   DOB: 03-22-1961, 63 y.o.   MRN: [id number]  Reason for Consult: Follow-up   Referred by Delbert Clam, MD  Subjective:     HPI:  Adam Dennis is a 63 y.o. male with a history right upper arm cephalic vein AV fistula that did undergo endovascular intervention last December.  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  He denies any pain and states that the fistula continues to work Tuesdays, Thursdays and Saturdays for dialysis without any dysfunction recently.  He has not had any bleeding issues from the fistula.  Past Medical History:  Diagnosis Date   Arthritis    back (02/06/2017)   CHF (congestive heart failure) (HCC)    new onset /Encounter Date 09/12/2006   Chronic combined systolic and diastolic heart failure (HCC)    CKD (chronic kidney disease) stage 4, GFR 15-29 ml/min (HCC)    COPD (chronic obstructive pulmonary disease) (HCC)    End stage renal disease on dialysis [facility])    Tues/ Thur/ Sat [address]   Glaucoma, right eye    High cholesterol    Hypertension    Hypertrophy of tonsils alone    Myocardial infarction (HCC)    small one; ~ 2008 (02/06/2017)   Obesity, unspecified    On home oxygen  therapy    4L; 24/7 (04/10/2017)   OSA on CPAP    Type II diabetes mellitus (HCC)    Family History  Problem Relation Age of Onset   Hypertension Mother    Diabetes Sister    Colon cancer Sister    Diabetes Brother    Cancer Maternal Aunt    Amblyopia Neg Hx    Blindness Neg Hx    Cataracts Neg Hx    Glaucoma Neg Hx    Macular degeneration Neg Hx    Strabismus Neg Hx    Retinal detachment Neg Hx    Retinitis pigmentosa Neg Hx    Past Surgical History:  Procedure Laterality Date   A/V FISTULAGRAM Right 04/30/2023   Procedure: A/V Fistulagram;  Surgeon: Lanis Fonda FORBES, MD;  Location: [facility] INVASIVE CV LAB;  Service: Cardiovascular;  Laterality: Right;   AV FISTULA  PLACEMENT Right 07/01/2019   Procedure: Right arm arteriovenous fistual;  Surgeon: Sheree Penne Bruckner, MD;  Location: [facility] OR;  Service: Vascular;  Laterality: Right;   FISTULA SUPERFICIALIZATION Right 09/01/2019   Procedure: RIGHT UPPER ARM FISTULA  TRANSPOSITION OF RIGHT UPPPER ARM BRACHIAL CEPHALIC FISTULA;  Surgeon: Sheree Penne Bruckner, MD;  Location: [facility] OR;  Service: Vascular;  Laterality: Right;   HERNIA REPAIR     INSERTION OF DIALYSIS CATHETER Right 07/01/2019   Procedure: INSERTION OF TUNNELED DIALYSIS CATHETER;  Surgeon: Sheree Penne Bruckner, MD;  Location: [facility] OR;  Service: Vascular;  Laterality: Right;   PERIPHERAL VASCULAR BALLOON ANGIOPLASTY Right 04/30/2023   Procedure: PERIPHERAL VASCULAR BALLOON ANGIOPLASTY;  Surgeon: Lanis Fonda FORBES, MD;  Location: [facility] INVASIVE CV LAB;  Service: Cardiovascular;  Laterality: Right;  AVF   TEE WITHOUT CARDIOVERSION N/A 04/16/2017   Procedure: TRANSESOPHAGEAL ECHOCARDIOGRAM (TEE);  Surgeon: Mona Vinie BROCKS, MD;  Location: [facility] ENDOSCOPY;  Service: Cardiovascular;  Laterality: N/A;   UMBILICAL HERNIA REPAIR  1960s   when I was little baby    Short Social History:  Social History   Tobacco Use   Smoking status: Never   Smokeless tobacco: Never  Substance Use Topics   Alcohol use: Yes  Comment: occasional    No Known Allergies  Current Outpatient Medications  Medication Sig Dispense Refill   ACCU-CHEK SOFTCLIX LANCETS lancets Use as instructed to check blood sugar up to 3 times daily. 100 each 11   amLODipine  (NORVASC ) 5 MG tablet Take 1 tablet (5 mg total) by mouth daily. 90 tablet 1   aspirin  EC 81 MG tablet Take 81 mg by mouth daily.     atorvastatin  (LIPITOR) 40 MG tablet TAKE 1 TABLET(40 MG) BY MOUTH DAILY 90 tablet 1   Blood Glucose Monitoring Suppl (ACCU-CHEK AVIVA) device Use as instructed to check blood sugar up to 3 times daily. 1 each 0   cinacalcet (SENSIPAR) 30 MG tablet Take 30 mg by mouth daily.      dorzolamide -timolol  (COSOPT ) 22.3-6.8 MG/ML ophthalmic solution Place 1 drop into both eyes 2 (two) times daily.     FEROSUL 325 (65 Fe) MG tablet TAKE 1 TABLET(325 MG) BY MOUTH DAILY WITH BREAKFAST (Patient taking differently: Take 325 mg by mouth Every Tuesday,Thursday,and Saturday with dialysis.) 100 tablet 3   glucose blood (ACCU-CHEK AVIVA PLUS) test strip Use as instructed to check blood sugar up to 3 times daily. 100 each 11   Lancet Devices (ACCU-CHEK SOFTCLIX) lancets Use as instructed daily. 1 each 5   methocarbamol  (ROBAXIN ) 500 MG tablet Take 1 tablet (500 mg total) by mouth every 8 (eight) hours as needed for muscle spasms. 180 tablet 1   nitroGLYCERIN  (NITROSTAT ) 0.4 MG SL tablet PLACE 1 TABLET BY MOUTH UNDER TONGUE EVERY 5 MINUTES FOR 3 DOSES AS NEEDED FOR CHEST PAIN (Patient taking differently: Place 0.4 mg under the tongue every 5 (five) minutes as needed for chest pain.) 25 tablet 0   Semaglutide , 1 MG/DOSE, (OZEMPIC , 1 MG/DOSE,) 4 MG/3ML SOPN Inject 1 mg into the skin once a week. 3 mL 6   sevelamer  carbonate (RENVELA ) 800 MG tablet Take 1,600-2,400 mg by mouth See admin instructions. 2400 mg with meals, and 1600 mg with snacks     TRAVATAN Z 0.004 % SOLN ophthalmic solution Place 1 drop into both eyes at bedtime.  12   Current Facility-Administered Medications  Medication Dose Route Frequency Provider Last Rate Last Admin   sodium chloride  flush (NS) 0.9 % injection 3 mL  3 mL Intravenous Q12H Robins, Joshua E, MD       sodium chloride  flush (NS) 0.9 % injection 3 mL  3 mL Intravenous PRN Robins, Joshua E, MD        Review of Systems  Constitutional:  Constitutional negative. HENT: HENT negative.  Eyes: Eyes negative.  Respiratory: Respiratory negative.  Cardiovascular: Cardiovascular negative.  GI: Gastrointestinal negative.  Musculoskeletal: Musculoskeletal negative.  Skin:       White skin overlying right upper extremity fistula Neurological: Neurological  negative. Hematologic: Hematologic/lymphatic negative.  Psychiatric: Psychiatric negative.        Objective:  Objective   Vitals:   02/04/24 0910  BP: 116/77  Pulse: 82  Temp: 97.8 F (36.6 C)  SpO2: 95%  Weight: (!) 314 lb (142.4 kg)  Height: 6' (1.829 m)   Body mass index is 42.59 kg/m.  Physical Exam HENT:     Head: Normocephalic.     Mouth/Throat:     Mouth: Mucous membranes are moist.  Eyes:     Pupils: Pupils are equal, round, and reactive to light.  Cardiovascular:     Rate and Rhythm: Normal rate.  Pulmonary:     Effort: Pulmonary effort is normal.  Abdominal:     General: Abdomen is flat.  Musculoskeletal:     Comments: Right upper extremity fistula has loss of pigmentation overlying 2 areas of pseudoaneurysmal degeneration and mild pulsatility  Neurological:     General: No focal deficit present.     Mental Status: He is alert.  Psychiatric:        Mood and Affect: Mood normal.     Data: No studies today     Assessment/Plan:     63 year old male with end-stage renal disease dialyzing via right upper arm cephalic vein fistula with pseudoaneurysmal degeneration and loss of pigmentation.  Skin is easily able to be pinched overlying the fistula and the fistula continues to work.  As such we have discussed no intervention at this time.  If the patient changes his mind he would need revision of the fistula with plication of the pseudoaneurysms and placement of tunneled catheter.  We discussed that any surgical intervention would put the fistula at risk for future thrombosis.  He demonstrates good understanding will follow-up with us  as needed.     Penne Lonni Colorado MD [facility]

## 2024-02-05 DIAGNOSIS — Z992 Dependence on renal dialysis: Secondary | ICD-10-CM | POA: Diagnosis not present

## 2024-02-05 DIAGNOSIS — N186 End stage renal disease: Secondary | ICD-10-CM | POA: Diagnosis not present

## 2024-02-05 DIAGNOSIS — N2581 Secondary hyperparathyroidism of renal origin: Secondary | ICD-10-CM | POA: Diagnosis not present

## 2024-02-07 DIAGNOSIS — N186 End stage renal disease: Secondary | ICD-10-CM | POA: Diagnosis not present

## 2024-02-07 DIAGNOSIS — Z992 Dependence on renal dialysis: Secondary | ICD-10-CM | POA: Diagnosis not present

## 2024-02-07 DIAGNOSIS — N2581 Secondary hyperparathyroidism of renal origin: Secondary | ICD-10-CM | POA: Diagnosis not present

## 2024-02-10 DIAGNOSIS — N186 End stage renal disease: Secondary | ICD-10-CM | POA: Diagnosis not present

## 2024-02-10 DIAGNOSIS — Z992 Dependence on renal dialysis: Secondary | ICD-10-CM | POA: Diagnosis not present

## 2024-02-10 DIAGNOSIS — N2581 Secondary hyperparathyroidism of renal origin: Secondary | ICD-10-CM | POA: Diagnosis not present

## 2024-02-11 DIAGNOSIS — I10 Essential (primary) hypertension: Secondary | ICD-10-CM | POA: Diagnosis not present

## 2024-02-11 DIAGNOSIS — N186 End stage renal disease: Secondary | ICD-10-CM | POA: Diagnosis not present

## 2024-02-11 DIAGNOSIS — E782 Mixed hyperlipidemia: Secondary | ICD-10-CM | POA: Diagnosis not present

## 2024-02-11 DIAGNOSIS — I5032 Chronic diastolic (congestive) heart failure: Secondary | ICD-10-CM | POA: Diagnosis not present

## 2024-02-12 DIAGNOSIS — Z992 Dependence on renal dialysis: Secondary | ICD-10-CM | POA: Diagnosis not present

## 2024-02-12 DIAGNOSIS — N2581 Secondary hyperparathyroidism of renal origin: Secondary | ICD-10-CM | POA: Diagnosis not present

## 2024-02-12 DIAGNOSIS — N186 End stage renal disease: Secondary | ICD-10-CM | POA: Diagnosis not present

## 2024-02-14 DIAGNOSIS — N186 End stage renal disease: Secondary | ICD-10-CM | POA: Diagnosis not present

## 2024-02-14 DIAGNOSIS — N2581 Secondary hyperparathyroidism of renal origin: Secondary | ICD-10-CM | POA: Diagnosis not present

## 2024-02-14 DIAGNOSIS — Z992 Dependence on renal dialysis: Secondary | ICD-10-CM | POA: Diagnosis not present

## 2024-02-16 ENCOUNTER — Other Ambulatory Visit: Payer: Self-pay | Admitting: Family Medicine

## 2024-02-16 DIAGNOSIS — E1169 Type 2 diabetes mellitus with other specified complication: Secondary | ICD-10-CM

## 2024-02-17 DIAGNOSIS — N2581 Secondary hyperparathyroidism of renal origin: Secondary | ICD-10-CM | POA: Diagnosis not present

## 2024-02-17 DIAGNOSIS — N186 End stage renal disease: Secondary | ICD-10-CM | POA: Diagnosis not present

## 2024-02-17 DIAGNOSIS — Z992 Dependence on renal dialysis: Secondary | ICD-10-CM | POA: Diagnosis not present

## 2024-02-17 NOTE — Telephone Encounter (Signed)
 Requested by interface surescripts. Future visit in 3 months. Requested Prescriptions  Pending Prescriptions Disp Refills   atorvastatin  (LIPITOR) 40 MG tablet [Pharmacy Med Name: ATORVASTATIN  40MG  TABLETS] 90 tablet 1    Sig: TAKE 1 TABLET(40 MG) BY MOUTH DAILY     Cardiovascular:  Antilipid - Statins Failed - 02/17/2024 10:08 AM      Failed - Lipid Panel in normal range within the last 12 months    Cholesterol, Total  Date Value Ref Range Status  12/10/2023 103 100 - 199 mg/dL Final   LDL Chol Calc (NIH)  Date Value Ref Range Status  12/10/2023 35 0 - 99 mg/dL Final   HDL  Date Value Ref Range Status  12/10/2023 52 >39 mg/dL Final   Triglycerides  Date Value Ref Range Status  12/10/2023 82 0 - 149 mg/dL Final         Passed - Patient is not pregnant      Passed - Valid encounter within last 12 months    Recent Outpatient Visits           2 months ago Type 2 diabetes mellitus with obesity   [facility] Of [place]. [facility] Delbert Clam, MD   8 months ago Type 2 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, without long-term current use of insulin  [facility])   [facility] [place]. [facility] Delbert Clam, MD   1 year ago Type 2 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, without long-term current use of insulin  [facility])   [facility] [place]. [facility] Delbert Clam, MD   1 year ago Encounter for [employer] annual wellness exam   [facility] [place]. [facility] Delbert Clam, MD   1 year ago Other emphysema [facility])   [facility] [place]. [facility] Delbert Clam, MD       Future Appointments             In 3 months Delbert Clam, MD [facility] [place] - A Dept Of [place]. [facility],  [place]

## 2024-02-19 DIAGNOSIS — N186 End stage renal disease: Secondary | ICD-10-CM | POA: Diagnosis not present

## 2024-02-19 DIAGNOSIS — N2581 Secondary hyperparathyroidism of renal origin: Secondary | ICD-10-CM | POA: Diagnosis not present

## 2024-02-19 DIAGNOSIS — Z992 Dependence on renal dialysis: Secondary | ICD-10-CM | POA: Diagnosis not present

## 2024-02-21 DIAGNOSIS — Z992 Dependence on renal dialysis: Secondary | ICD-10-CM | POA: Diagnosis not present

## 2024-02-21 DIAGNOSIS — N2581 Secondary hyperparathyroidism of renal origin: Secondary | ICD-10-CM | POA: Diagnosis not present

## 2024-02-21 DIAGNOSIS — N186 End stage renal disease: Secondary | ICD-10-CM | POA: Diagnosis not present

## 2024-02-24 DIAGNOSIS — E1122 Type 2 diabetes mellitus with diabetic chronic kidney disease: Secondary | ICD-10-CM | POA: Diagnosis not present

## 2024-02-24 DIAGNOSIS — Z992 Dependence on renal dialysis: Secondary | ICD-10-CM | POA: Diagnosis not present

## 2024-02-24 DIAGNOSIS — N186 End stage renal disease: Secondary | ICD-10-CM | POA: Diagnosis not present

## 2024-02-24 DIAGNOSIS — N2581 Secondary hyperparathyroidism of renal origin: Secondary | ICD-10-CM | POA: Diagnosis not present

## 2024-02-26 DIAGNOSIS — N186 End stage renal disease: Secondary | ICD-10-CM | POA: Diagnosis not present

## 2024-02-26 DIAGNOSIS — N2581 Secondary hyperparathyroidism of renal origin: Secondary | ICD-10-CM | POA: Diagnosis not present

## 2024-02-26 DIAGNOSIS — Z992 Dependence on renal dialysis: Secondary | ICD-10-CM | POA: Diagnosis not present

## 2024-02-28 DIAGNOSIS — N186 End stage renal disease: Secondary | ICD-10-CM | POA: Diagnosis not present

## 2024-02-28 DIAGNOSIS — N2581 Secondary hyperparathyroidism of renal origin: Secondary | ICD-10-CM | POA: Diagnosis not present

## 2024-02-28 DIAGNOSIS — Z992 Dependence on renal dialysis: Secondary | ICD-10-CM | POA: Diagnosis not present

## 2024-03-02 DIAGNOSIS — N2581 Secondary hyperparathyroidism of renal origin: Secondary | ICD-10-CM | POA: Diagnosis not present

## 2024-03-04 DIAGNOSIS — N186 End stage renal disease: Secondary | ICD-10-CM | POA: Diagnosis not present

## 2024-03-11 DIAGNOSIS — N186 End stage renal disease: Secondary | ICD-10-CM | POA: Diagnosis not present

## 2024-03-11 DIAGNOSIS — N2581 Secondary hyperparathyroidism of renal origin: Secondary | ICD-10-CM | POA: Diagnosis not present

## 2024-03-11 DIAGNOSIS — Z992 Dependence on renal dialysis: Secondary | ICD-10-CM | POA: Diagnosis not present

## 2024-03-13 DIAGNOSIS — N186 End stage renal disease: Secondary | ICD-10-CM | POA: Diagnosis not present

## 2024-03-16 DIAGNOSIS — N2581 Secondary hyperparathyroidism of renal origin: Secondary | ICD-10-CM | POA: Diagnosis not present

## 2024-03-16 DIAGNOSIS — N186 End stage renal disease: Secondary | ICD-10-CM | POA: Diagnosis not present

## 2024-03-18 DIAGNOSIS — N186 End stage renal disease: Secondary | ICD-10-CM | POA: Diagnosis not present

## 2024-03-20 DIAGNOSIS — N2581 Secondary hyperparathyroidism of renal origin: Secondary | ICD-10-CM | POA: Diagnosis not present

## 2024-03-20 DIAGNOSIS — Z992 Dependence on renal dialysis: Secondary | ICD-10-CM | POA: Diagnosis not present

## 2024-03-21 ENCOUNTER — Other Ambulatory Visit: Payer: Self-pay | Admitting: Family Medicine

## 2024-03-21 DIAGNOSIS — E1122 Type 2 diabetes mellitus with diabetic chronic kidney disease: Secondary | ICD-10-CM

## 2024-03-21 DIAGNOSIS — Z7985 Long-term (current) use of injectable non-insulin antidiabetic drugs: Secondary | ICD-10-CM

## 2024-03-23 DIAGNOSIS — Z992 Dependence on renal dialysis: Secondary | ICD-10-CM | POA: Diagnosis not present

## 2024-03-23 DIAGNOSIS — N2581 Secondary hyperparathyroidism of renal origin: Secondary | ICD-10-CM | POA: Diagnosis not present

## 2024-03-26 DIAGNOSIS — N186 End stage renal disease: Secondary | ICD-10-CM | POA: Diagnosis not present

## 2024-03-26 DIAGNOSIS — Z992 Dependence on renal dialysis: Secondary | ICD-10-CM | POA: Diagnosis not present

## 2024-03-26 DIAGNOSIS — E1122 Type 2 diabetes mellitus with diabetic chronic kidney disease: Secondary | ICD-10-CM | POA: Diagnosis not present

## 2024-03-27 DIAGNOSIS — N2581 Secondary hyperparathyroidism of renal origin: Secondary | ICD-10-CM | POA: Diagnosis not present

## 2024-03-27 DIAGNOSIS — Z992 Dependence on renal dialysis: Secondary | ICD-10-CM | POA: Diagnosis not present

## 2024-03-30 DIAGNOSIS — Z992 Dependence on renal dialysis: Secondary | ICD-10-CM | POA: Diagnosis not present

## 2024-03-30 DIAGNOSIS — N2581 Secondary hyperparathyroidism of renal origin: Secondary | ICD-10-CM | POA: Diagnosis not present

## 2024-04-01 DIAGNOSIS — N186 End stage renal disease: Secondary | ICD-10-CM | POA: Diagnosis not present

## 2024-04-01 DIAGNOSIS — N2581 Secondary hyperparathyroidism of renal origin: Secondary | ICD-10-CM | POA: Diagnosis not present

## 2024-04-01 DIAGNOSIS — Z992 Dependence on renal dialysis: Secondary | ICD-10-CM | POA: Diagnosis not present

## 2024-04-08 DIAGNOSIS — Z992 Dependence on renal dialysis: Secondary | ICD-10-CM | POA: Diagnosis not present

## 2024-04-08 DIAGNOSIS — N2581 Secondary hyperparathyroidism of renal origin: Secondary | ICD-10-CM | POA: Diagnosis not present

## 2024-04-13 DIAGNOSIS — N186 End stage renal disease: Secondary | ICD-10-CM | POA: Diagnosis not present

## 2024-04-17 DIAGNOSIS — N186 End stage renal disease: Secondary | ICD-10-CM | POA: Diagnosis not present

## 2024-04-19 DIAGNOSIS — N186 End stage renal disease: Secondary | ICD-10-CM | POA: Diagnosis not present

## 2024-04-19 DIAGNOSIS — Z992 Dependence on renal dialysis: Secondary | ICD-10-CM | POA: Diagnosis not present

## 2024-04-21 DIAGNOSIS — N186 End stage renal disease: Secondary | ICD-10-CM | POA: Diagnosis not present

## 2024-04-25 DIAGNOSIS — Z992 Dependence on renal dialysis: Secondary | ICD-10-CM | POA: Diagnosis not present

## 2024-04-25 DIAGNOSIS — N186 End stage renal disease: Secondary | ICD-10-CM | POA: Diagnosis not present

## 2024-04-25 DIAGNOSIS — E1122 Type 2 diabetes mellitus with diabetic chronic kidney disease: Secondary | ICD-10-CM | POA: Diagnosis not present

## 2024-05-18 ENCOUNTER — Other Ambulatory Visit: Payer: Self-pay | Admitting: Family Medicine

## 2024-05-18 DIAGNOSIS — I1311 Hypertensive heart and chronic kidney disease without heart failure, with stage 5 chronic kidney disease, or end stage renal disease: Secondary | ICD-10-CM

## 2024-06-04 ENCOUNTER — Telehealth: Payer: Self-pay | Admitting: Family Medicine

## 2024-06-04 NOTE — Telephone Encounter (Signed)
 Pt confirmed appt

## 2024-06-07 ENCOUNTER — Ambulatory Visit: Attending: Family Medicine | Admitting: Family Medicine

## 2024-06-07 ENCOUNTER — Encounter: Payer: Self-pay | Admitting: Family Medicine

## 2024-06-07 VITALS — BP 141/78 | HR 72 | Temp 97.6°F | Ht 72.0 in | Wt 307.8 lb

## 2024-06-07 DIAGNOSIS — E1122 Type 2 diabetes mellitus with diabetic chronic kidney disease: Secondary | ICD-10-CM

## 2024-06-07 DIAGNOSIS — H4010X0 Unspecified open-angle glaucoma, stage unspecified: Secondary | ICD-10-CM | POA: Diagnosis not present

## 2024-06-07 DIAGNOSIS — N186 End stage renal disease: Secondary | ICD-10-CM

## 2024-06-07 DIAGNOSIS — J9611 Chronic respiratory failure with hypoxia: Secondary | ICD-10-CM | POA: Diagnosis not present

## 2024-06-07 DIAGNOSIS — Z6841 Body Mass Index (BMI) 40.0 and over, adult: Secondary | ICD-10-CM

## 2024-06-07 DIAGNOSIS — J449 Chronic obstructive pulmonary disease, unspecified: Secondary | ICD-10-CM | POA: Diagnosis not present

## 2024-06-07 DIAGNOSIS — I1311 Hypertensive heart and chronic kidney disease without heart failure, with stage 5 chronic kidney disease, or end stage renal disease: Secondary | ICD-10-CM | POA: Diagnosis not present

## 2024-06-07 DIAGNOSIS — Z992 Dependence on renal dialysis: Secondary | ICD-10-CM

## 2024-06-07 DIAGNOSIS — E669 Obesity, unspecified: Secondary | ICD-10-CM | POA: Diagnosis not present

## 2024-06-07 DIAGNOSIS — Z7985 Long-term (current) use of injectable non-insulin antidiabetic drugs: Secondary | ICD-10-CM

## 2024-06-07 LAB — POCT GLYCOSYLATED HEMOGLOBIN (HGB A1C): HbA1c, POC (controlled diabetic range): 6 % (ref 0.0–7.0)

## 2024-06-07 MED ORDER — AMLODIPINE BESYLATE 5 MG PO TABS: 5.0000 mg | ORAL_TABLET | Freq: Every day | ORAL | 1 refills | Status: AC

## 2024-06-07 MED ORDER — SEMAGLUTIDE (2 MG/DOSE) 8 MG/3ML ~~LOC~~ SOPN: 2.0000 mg | PEN_INJECTOR | SUBCUTANEOUS | 1 refills | Status: AC

## 2024-06-07 MED ORDER — ATORVASTATIN CALCIUM 40 MG PO TABS: ORAL_TABLET | ORAL | 1 refills | Status: AC

## 2024-06-07 NOTE — Patient Instructions (Signed)
 VISIT SUMMARY:  Adam Dennis, a 64 year old male with end-stage renal disease on dialysis, came in for a follow-up visit. He reports that his dialysis sessions are going well, and he is managing his medications and oxygen  therapy. His A1c is slightly up, and he has lost seven pounds. He has glaucoma and occasional numbness or tingling in his hands or feet. He received his flu shot at the dialysis center.  YOUR PLAN:  -TYPE 2 DIABETES MELLITUS WITH CHRONIC KIDNEY DISEASE ON CHRONIC DIALYSIS: Your A1c is well-controlled at 6.0, and you have lost 7 pounds. We have increased your Ozempic  dose to 2 mg weekly to help with further weight loss. Please monitor your blood glucose regularly to avoid low blood sugar levels.  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 Continue taking your medications as prescribed.  -TYPE 2 DIABETES MELLITUS IN PATIENT WITH OBESITY: You have lost 7 pounds, and your A1c is well-controlled. We have increased your Ozempic  dose to 2 mg weekly to help with further weight loss.  -GLAUCOMA: You have been using your eye drops inconsistently. It is important to use your eye drops daily to prevent vision loss. Please make sure to use them every day.  -CHRONIC RESPIRATORY FAILURE WITH HYPOXIA: You are currently on 4 liters of oxygen  and using BiPAP at night. Continue with your current oxygen  therapy and BiPAP use.  -GENERAL HEALTH MAINTENANCE: You received your flu shot at the dialysis center. Keep up with your routine health maintenance and follow any additional recommendations from your healthcare providers.  INSTRUCTIONS:  Please monitor your blood glucose regularly to avoid hypoglycemia. Continue taking your medications as prescribed and use your eye drops daily. Follow up with your healthcare providers as recommended.

## 2024-06-07 NOTE — Progress Notes (Signed)
 "  Subjective:  Patient ID: Adam Dennis, male    DOB: [birth year]  Age: 64 y.o. MRN: [id number]  CC: Medical Management of Chronic Issues     Discussed the use of AI scribe software for clinical note transcription with the patient, who gave verbal consent to proceed.  History of Present Illness Adam Dennis is a 64 year old male with a history of type 2 diabetes (A1c 6.0), hypertension, ESRD on HD, obstructive sleep apnea (on BiPAP at night), combined systolic and diastolic CHF (EF 49-44% from echo of 10/2021),emphysema, chronic respiratory failure ( on 4L oxygen ) who presents for a follow-up visit.  He reports dialysis sessions are going well. Last session was Saturday, and next is tomorrow morning. He takes aspirin  in the morning and delays his blood pressure medication until after dialysis.  He uses 4 L of oxygen  and sometimes lowers it. He uses CPAP at night connected to oxygen  but does not consistently use oxygen  during the day. He has COPD without recent flares or persistent cough and follows with pulmonology yearly.  His A1c is 6.0, slightly up from 5.9. He is on Ozempic  1 mg and has lost about seven pounds. He does not regularly check blood sugars at home. He would like to increase his Ozempic  dose for additional weight loss.  He has glaucoma and uses eye drops only when his vision feels impaired. He notes occasional numbness or tingling in hands or feet, which he links to sleeping position.  He received his flu shot at the dialysis center.    Past Medical History:  Diagnosis Date   Arthritis    back (02/06/2017)   CHF (congestive heart failure) (HCC)    new onset /Encounter Date 09/12/2006   Chronic combined systolic and diastolic heart failure (HCC)    CKD (chronic kidney disease) stage 4, GFR 15-29 ml/min (HCC)    COPD (chronic obstructive pulmonary disease) (HCC)    End stage renal disease on dialysis [facility])    Tues/ Thur/ Sat [address]   Glaucoma, right eye     High cholesterol    Hypertension    Hypertrophy of tonsils alone    Myocardial infarction (HCC)    small one; ~ 2008 (02/06/2017)   Obesity, unspecified    On home oxygen  therapy    4L; 24/7 (04/10/2017)   OSA on CPAP    Type II diabetes mellitus (HCC)     Past Surgical History:  Procedure Laterality Date   A/V FISTULAGRAM Right 04/30/2023   Procedure: A/V Fistulagram;  Surgeon: Lanis Fonda FORBES, MD;  Location[facility] INVASIVE CV LAB;  Service: Cardiovascular;  Laterality: Right;   AV FISTULA PLACEMENT Right 07/01/2019   Procedure: Right arm arteriovenous fistual;  Surgeon: Sheree Penne Bruckner, MD;  Location: [facility] OR;  Service: Vascular;  Laterality: Right;   FISTULA SUPERFICIALIZATION Right 09/01/2019   Procedure: RIGHT UPPER ARM FISTULA  TRANSPOSITION OF RIGHT UPPPER ARM BRACHIAL CEPHALIC FISTULA;  Surgeon: Sheree Penne Bruckner, MD;  Location: [facility] OR;  Service: Vascular;  Laterality: Right;   HERNIA REPAIR     INSERTION OF DIALYSIS CATHETER Right 07/01/2019   Procedure: INSERTION OF TUNNELED DIALYSIS CATHETER;  Surgeon: Sheree Penne Bruckner, MD;  Location: [facility] OR;  Service: Vascular;  Laterality: Right;   PERIPHERAL VASCULAR BALLOON ANGIOPLASTY Right 04/30/2023   Procedure: PERIPHERAL VASCULAR BALLOON ANGIOPLASTY;  Surgeon: Lanis Fonda FORBES, MD;  Location: [facility] INVASIVE CV LAB;  Service: Cardiovascular;  Laterality: Right;  AVF   TEE WITHOUT CARDIOVERSION  N/A 04/16/2017   Procedure: TRANSESOPHAGEAL ECHOCARDIOGRAM (TEE);  Surgeon: Mona Vinie BROCKS, MD;  Location: [facility] ENDOSCOPY;  Service: Cardiovascular;  Laterality: N/A;   UMBILICAL HERNIA REPAIR  1960s   when I was little baby    Family History  Problem Relation Age of Onset   Hypertension Mother    Diabetes Sister    Colon cancer Sister    Diabetes Brother    Cancer Maternal Aunt    Amblyopia Neg Hx    Blindness Neg Hx    Cataracts Neg Hx    Glaucoma Neg Hx    Macular degeneration Neg Hx    Strabismus Neg Hx     Retinal detachment Neg Hx    Retinitis pigmentosa Neg Hx     Social History   Socioeconomic History   Marital status: Married    Spouse name: Not on file   Number of children: 0   Years of education: Not on file   Highest education level: Not on file  Occupational History   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  Tobacco Use   Smoking status: Never   Smokeless tobacco: Never  Vaping Use   Vaping status: Never Used  Substance and Sexual Activity   Alcohol use: Yes    Comment: occasional   Drug use: No   Sexual activity: Not Currently  Other Topics Concern   Not on file  Social History Narrative   Not on file   Social Drivers of Health   Tobacco Use: Low Risk (02/04/2024)   Patient History    Smoking Tobacco Use: Never    Smokeless Tobacco Use: Never    Passive Exposure: Not on file  Financial Resource Strain: Low Risk (06/04/2023)   Overall Financial Resource Strain (CARDIA)    Difficulty of Paying Living Expenses: Not very hard  Food Insecurity: No Food Insecurity (06/04/2023)   Hunger Vital Sign    Worried About Running Out of Food in the Last Year: Never true    Ran Out of Food in the Last Year: Never true  Transportation Needs: No Transportation Needs (06/04/2023)   PRAPARE - Administrator, Civil Service (Medical): No    Lack of Transportation (Non-Medical): No  Physical Activity: Insufficiently Active (06/04/2023)   Exercise Vital Sign    Days of Exercise per Week: 3 days    Minutes of Exercise per Session: 20 min  Stress: No Stress Concern Present (06/04/2023)   [employer] of Occupational Health - Occupational Stress Questionnaire    Feeling of Stress : Not at all  Social Connections: Socially Isolated (06/04/2023)   Social Connection and Isolation Panel    Frequency of Communication with Friends and Family: Once a week    Frequency of Social Gatherings with Friends and Family: Once a week    Attends Religious Services: Never    Database Administrator or  Organizations: No    Attends Banker Meetings: Never    Marital Status: Living with partner  Depression (PHQ2-9): Low Risk (06/07/2024)   Depression (PHQ2-9)    PHQ-2 Score: 0  Alcohol Screen: Low Risk (06/04/2023)   Alcohol Screen    Last Alcohol Screening Score (AUDIT): 2  Housing: Unknown (06/04/2023)   Housing Stability Vital Sign    Unable to Pay for Housing in the Last Year: No    Number of Times Moved in the Last Year: Not on file    Homeless in the Last Year: No  Utilities: Not At Risk (  06/04/2023)   AHC Utilities    Threatened with loss of utilities: No  Health Literacy: Adequate Health Literacy (06/04/2023)   B1300 Health Literacy    Frequency of need for help with medical instructions: Never    Allergies[1]  Outpatient Medications Prior to Visit  Medication Sig Dispense Refill   ACCU-CHEK SOFTCLIX LANCETS lancets Use as instructed to check blood sugar up to 3 times daily. 100 each 11   aspirin  EC 81 MG tablet Take 81 mg by mouth daily.     Blood Glucose Monitoring Suppl (ACCU-CHEK AVIVA) device Use as instructed to check blood sugar up to 3 times daily. 1 each 0   cinacalcet (SENSIPAR) 30 MG tablet Take 30 mg by mouth daily.     dorzolamide -timolol  (COSOPT ) 22.3-6.8 MG/ML ophthalmic solution Place 1 drop into both eyes 2 (two) times daily.     glucose blood (ACCU-CHEK AVIVA PLUS) test strip Use as instructed to check blood sugar up to 3 times daily. 100 each 11   Lancet Devices (ACCU-CHEK SOFTCLIX) lancets Use as instructed daily. 1 each 5   nitroGLYCERIN  (NITROSTAT ) 0.4 MG SL tablet PLACE 1 TABLET BY MOUTH UNDER TONGUE EVERY 5 MINUTES FOR 3 DOSES AS NEEDED FOR CHEST PAIN (Patient taking differently: Place 0.4 mg under the tongue every 5 (five) minutes as needed for chest pain.) 25 tablet 0   sevelamer  carbonate (RENVELA ) 800 MG tablet Take 1,600-2,400 mg by mouth See admin instructions. 2400 mg with meals, and 1600 mg with snacks     TRAVATAN Z 0.004 % SOLN  ophthalmic solution Place 1 drop into both eyes at bedtime.  12   amLODipine  (NORVASC ) 5 MG tablet TAKE 1 TABLET(5 MG) BY MOUTH DAILY 90 tablet 1   atorvastatin  (LIPITOR) 40 MG tablet TAKE 1 TABLET(40 MG) BY MOUTH DAILY 90 tablet 1   methocarbamol  (ROBAXIN ) 500 MG tablet Take 1 tablet (500 mg total) by mouth every 8 (eight) hours as needed for muscle spasms. 180 tablet 1   OZEMPIC , 1 MG/DOSE, 4 MG/3ML SOPN INJECT 1 MG INTO THE SKIN ONCE A WEEK 3 mL 6   FEROSUL 325 (65 Fe) MG tablet TAKE 1 TABLET(325 MG) BY MOUTH DAILY WITH BREAKFAST (Patient not taking: Reported on 06/07/2024) 100 tablet 3   Facility-Administered Medications Prior to Visit  Medication Dose Route Frequency Provider Last Rate Last Admin   sodium chloride  flush (NS) 0.9 % injection 3 mL  3 mL Intravenous Q12H Robins, Joshua E, MD       sodium chloride  flush (NS) 0.9 % injection 3 mL  3 mL Intravenous PRN Robins, Joshua E, MD         ROS Review of Systems  Constitutional:  Negative for activity change and appetite change.  HENT:  Negative for sinus pressure and sore throat.   Respiratory:  Negative for chest tightness, shortness of breath and wheezing.   Cardiovascular:  Negative for chest pain and palpitations.  Gastrointestinal:  Negative for abdominal distention, abdominal pain and constipation.  Genitourinary: Negative.   Musculoskeletal: Negative.   Psychiatric/Behavioral:  Negative for behavioral problems and dysphoric mood.     Objective:  BP (!) 141/78   Pulse 72   Temp 97.6 F (36.4 C) (Oral)   Ht 6' (1.829 m)   Wt (!) 307 lb 12.8 oz (139.6 kg)   SpO2 96%   BMI 41.75 kg/m      06/07/2024    2:10 PM 06/07/2024    1:42 PM 02/04/2024    9:10  AM  BP/Weight  Systolic BP 141 150 116  Diastolic BP 78 84 77  Wt. (Lbs)  307.8 314  BMI  41.75 kg/m2 42.59 kg/m2    Wt Readings from Last 3 Encounters:  06/07/24 (!) 307 lb 12.8 oz (139.6 kg)  02/04/24 (!) 314 lb (142.4 kg)  12/03/23 (!) 312 lb 9.6 oz (141.8 kg)      Physical Exam Constitutional:      Appearance: He is well-developed. He is obese.  HENT:     Nose:     Comments: On 4L via nasal cannula Cardiovascular:     Rate and Rhythm: Normal rate.     Heart sounds: Normal heart sounds. No murmur heard. Pulmonary:     Effort: Pulmonary effort is normal.     Breath sounds: Normal breath sounds. No wheezing or rales.  Chest:     Chest wall: No tenderness.  Abdominal:     General: Bowel sounds are normal. There is no distension.     Palpations: Abdomen is soft. There is no mass.     Tenderness: There is no abdominal tenderness.  Musculoskeletal:        General: Normal range of motion.     Right lower leg: No edema.     Left lower leg: No edema.     Comments: Right AV fistula with palpable thrill  Neurological:     Mental Status: He is alert and oriented to person, place, and time.  Psychiatric:        Mood and Affect: Mood normal.        Latest Ref Rng & Units 04/30/2023    8:24 AM 01/26/2022    8:02 AM 01/25/2022    6:27 AM  CMP  Glucose 70 - 99 mg/dL 92  889  96   BUN 8 - 23 mg/dL 31  48  28   Creatinine 0.61 - 1.24 mg/dL 1.79  0.00  2.45   Sodium 135 - 145 mmol/L 141  137  135   Potassium 3.5 - 5.1 mmol/L 4.8  4.5  4.5   Chloride 98 - 111 mmol/L 100  98  94   CO2 22 - 32 mmol/L  29  31   Calcium  8.9 - 10.3 mg/dL  9.4  9.5     Lipid Panel     Component Value Date/Time   CHOL 103 12/10/2023 0911   TRIG 82 12/10/2023 0911   HDL 52 12/10/2023 0911   CHOLHDL 2.1 11/11/2021 0057   VLDL 21 11/11/2021 0057   LDLCALC 35 12/10/2023 0911    CBC    Component Value Date/Time   WBC 5.5 01/26/2022 0802   RBC 4.28 01/26/2022 0802   HGB 14.3 04/30/2023 0824   HGB 12.8 (L) 03/24/2019 1017   HCT 42.0 04/30/2023 0824   HCT 39.7 03/24/2019 1017   PLT 257 01/26/2022 0802   PLT 191 03/24/2019 1017   MCV 88.8 01/26/2022 0802   MCV 87 03/24/2019 1017   MCH 26.9 01/26/2022 0802   MCHC 30.3 01/26/2022 0802   RDW 17.5 (H)  01/26/2022 0802   RDW 13.9 03/24/2019 1017   LYMPHSABS 1.0 01/21/2022 0845   LYMPHSABS 1.3 03/24/2019 1017   MONOABS 0.6 01/21/2022 0845   EOSABS 0.1 01/21/2022 0845   EOSABS 0.0 03/24/2019 1017   BASOSABS 0.0 01/21/2022 0845   BASOSABS 0.0 03/24/2019 1017    Lab Results  Component Value Date   HGBA1C 6.0 06/07/2024    Lab Results  Component  Value Date   HGBA1C 6.0 06/07/2024   HGBA1C 5.9 12/03/2023   HGBA1C 5.9 06/04/2023       Assessment & Plan Type 2 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 A1c well-controlled at 6.0. Weight loss of 7 pounds. - Increased Ozempic  to 2 mg weekly for weight loss. - Instructed to monitor blood glucose to avoid hypoglycemia. -Counseled on Diabetic diet, the healthy plate, 849 minutes of moderate intensity exercise/week Blood sugar logs with fasting goals of 80-120 mg/dl, random of less than 819 and in the event of sugars less than 60 mg/dl or greater than 599 mg/dl encouraged to notify the clinic. Advised on the need for annual eye exams, annual foot exams, Pneumonia vaccine.   Benign hypertensive heart and kidney disease with end stage renal disease Euvolemic with EF of 50 to 55% Blood pressure improved to 141/78. No changes needed. - Continue current antihypertensive regimen.  Type 2 diabetes mellitus in patient with obesity Weight loss of 7 pounds. A1c well-controlled. - Increased Ozempic  to 2 mg weekly for further weight loss.  Glaucoma Inconsistent use of eye drops. Educated on daily use to prevent vision loss. - Educated on importance of daily eye drop use.  COPD/ Chronic respiratory failure with hypoxia On 4 liters of oxygen  and BiPAP at night. - Continue current oxygen  therapy and BiPAP use. - No COPD flares and he currently is not on any inhalers  General health maintenance Flu shot received.     Meds ordered this encounter  Medications   Semaglutide , 2 MG/DOSE, 8 MG/3ML SOPN    Sig: Inject  2 mg as directed once a week.    Dispense:  3 mL    Refill:  1    Discontinue previous dose   amLODipine  (NORVASC ) 5 MG tablet    Sig: Take 1 tablet (5 mg total) by mouth daily.    Dispense:  90 tablet    Refill:  1   atorvastatin  (LIPITOR) 40 MG tablet    Sig: TAKE 1 TABLET(40 MG) BY MOUTH DAILY    Dispense:  90 tablet    Refill:  1    Follow-up: Return in about 6 months (around 12/05/2024) for Chronic medical conditions.       Corrina Sabin, MD, FAAFP. [facility] [place], [place] [phone]   06/07/2024, 4:09 PM     [1] No Known Allergies  "
